# Patient Record
Sex: Female | Born: 1935 | Race: White | Hispanic: No | Smoking: Former smoker
Health system: Southern US, Community
[De-identification: ages and names within clinical notes are randomized; demographics above are authoritative.]

## PROBLEM LIST (undated history)

## (undated) DIAGNOSIS — C3490 Malignant neoplasm of unspecified part of unspecified bronchus or lung: Secondary | ICD-10-CM

## (undated) DIAGNOSIS — D126 Benign neoplasm of colon, unspecified: Secondary | ICD-10-CM

## (undated) DIAGNOSIS — D649 Anemia, unspecified: Secondary | ICD-10-CM

## (undated) DIAGNOSIS — R0600 Dyspnea, unspecified: Secondary | ICD-10-CM

## (undated) DIAGNOSIS — L719 Rosacea, unspecified: Secondary | ICD-10-CM

## (undated) DIAGNOSIS — I499 Cardiac arrhythmia, unspecified: Secondary | ICD-10-CM

## (undated) DIAGNOSIS — R51 Headache: Secondary | ICD-10-CM

## (undated) DIAGNOSIS — R519 Headache, unspecified: Secondary | ICD-10-CM

## (undated) DIAGNOSIS — Z79811 Long term (current) use of aromatase inhibitors: Secondary | ICD-10-CM

## (undated) DIAGNOSIS — C4490 Unspecified malignant neoplasm of skin, unspecified: Secondary | ICD-10-CM

## (undated) DIAGNOSIS — C50911 Malignant neoplasm of unspecified site of right female breast: Secondary | ICD-10-CM

## (undated) DIAGNOSIS — J189 Pneumonia, unspecified organism: Secondary | ICD-10-CM

## (undated) DIAGNOSIS — C50919 Malignant neoplasm of unspecified site of unspecified female breast: Secondary | ICD-10-CM

## (undated) DIAGNOSIS — R000 Tachycardia, unspecified: Secondary | ICD-10-CM

## (undated) DIAGNOSIS — Z86010 Personal history of colonic polyps: Secondary | ICD-10-CM

## (undated) DIAGNOSIS — K5790 Diverticulosis of intestine, part unspecified, without perforation or abscess without bleeding: Secondary | ICD-10-CM

## (undated) DIAGNOSIS — D329 Benign neoplasm of meninges, unspecified: Secondary | ICD-10-CM

## (undated) DIAGNOSIS — M1990 Unspecified osteoarthritis, unspecified site: Secondary | ICD-10-CM

## (undated) HISTORY — PX: BREAST EXCISIONAL BIOPSY: SUR124

## (undated) HISTORY — PX: EYE SURGERY: SHX253

## (undated) HISTORY — DX: Malignant neoplasm of unspecified site of right female breast: C50.911

## (undated) HISTORY — DX: Unspecified osteoarthritis, unspecified site: M19.90

## (undated) HISTORY — PX: COLONOSCOPY: SHX174

## (undated) HISTORY — PX: BREAST LUMPECTOMY: SHX2

## (undated) HISTORY — DX: Personal history of colonic polyps: Z86.010

## (undated) HISTORY — DX: Rosacea, unspecified: L71.9

## (undated) HISTORY — DX: Diverticulosis of intestine, part unspecified, without perforation or abscess without bleeding: K57.90

## (undated) HISTORY — DX: Benign neoplasm of colon, unspecified: D12.6

## (undated) HISTORY — PX: MOHS SURGERY: SUR867

---

## 1958-12-19 HISTORY — PX: BREAST BIOPSY: SHX20

## 1988-12-19 HISTORY — PX: WRIST SURGERY: SHX841

## 1998-10-09 ENCOUNTER — Other Ambulatory Visit: Admission: RE | Admit: 1998-10-09 | Discharge: 1998-10-09 | Payer: Self-pay | Admitting: Internal Medicine

## 1999-01-20 ENCOUNTER — Encounter (HOSPITAL_BASED_OUTPATIENT_CLINIC_OR_DEPARTMENT_OTHER): Payer: Self-pay | Admitting: Internal Medicine

## 1999-01-20 ENCOUNTER — Ambulatory Visit (HOSPITAL_COMMUNITY): Admission: RE | Admit: 1999-01-20 | Discharge: 1999-01-20 | Payer: Self-pay | Admitting: Internal Medicine

## 2000-02-03 ENCOUNTER — Ambulatory Visit (HOSPITAL_COMMUNITY): Admission: RE | Admit: 2000-02-03 | Discharge: 2000-02-03 | Payer: Self-pay | Admitting: Internal Medicine

## 2000-02-03 ENCOUNTER — Encounter (HOSPITAL_BASED_OUTPATIENT_CLINIC_OR_DEPARTMENT_OTHER): Payer: Self-pay | Admitting: Internal Medicine

## 2000-10-26 ENCOUNTER — Other Ambulatory Visit: Admission: RE | Admit: 2000-10-26 | Discharge: 2000-10-26 | Payer: Self-pay | Admitting: Internal Medicine

## 2001-02-07 ENCOUNTER — Encounter (HOSPITAL_BASED_OUTPATIENT_CLINIC_OR_DEPARTMENT_OTHER): Payer: Self-pay | Admitting: Internal Medicine

## 2001-02-07 ENCOUNTER — Ambulatory Visit (HOSPITAL_COMMUNITY): Admission: RE | Admit: 2001-02-07 | Discharge: 2001-02-07 | Payer: Self-pay | Admitting: Internal Medicine

## 2002-02-21 ENCOUNTER — Ambulatory Visit (HOSPITAL_COMMUNITY): Admission: RE | Admit: 2002-02-21 | Discharge: 2002-02-21 | Payer: Self-pay | Admitting: Internal Medicine

## 2002-02-21 ENCOUNTER — Encounter: Payer: Self-pay | Admitting: Internal Medicine

## 2002-05-19 DIAGNOSIS — Z86010 Personal history of colonic polyps: Secondary | ICD-10-CM

## 2002-05-19 DIAGNOSIS — Z860101 Personal history of adenomatous and serrated colon polyps: Secondary | ICD-10-CM

## 2002-05-19 HISTORY — DX: Personal history of colonic polyps: Z86.010

## 2002-05-19 HISTORY — DX: Personal history of adenomatous and serrated colon polyps: Z86.0101

## 2002-05-28 ENCOUNTER — Encounter: Payer: Self-pay | Admitting: Gastroenterology

## 2003-02-24 ENCOUNTER — Encounter: Payer: Self-pay | Admitting: Internal Medicine

## 2003-02-24 ENCOUNTER — Ambulatory Visit (HOSPITAL_COMMUNITY): Admission: RE | Admit: 2003-02-24 | Discharge: 2003-02-24 | Payer: Self-pay | Admitting: Internal Medicine

## 2004-01-14 ENCOUNTER — Ambulatory Visit (HOSPITAL_COMMUNITY): Admission: RE | Admit: 2004-01-14 | Discharge: 2004-01-14 | Payer: Self-pay | Admitting: Ophthalmology

## 2004-01-21 ENCOUNTER — Ambulatory Visit (HOSPITAL_COMMUNITY): Admission: RE | Admit: 2004-01-21 | Discharge: 2004-01-21 | Payer: Self-pay | Admitting: Ophthalmology

## 2004-02-26 ENCOUNTER — Ambulatory Visit (HOSPITAL_COMMUNITY): Admission: RE | Admit: 2004-02-26 | Discharge: 2004-02-26 | Payer: Self-pay | Admitting: Internal Medicine

## 2005-06-14 ENCOUNTER — Ambulatory Visit: Payer: Self-pay | Admitting: Gastroenterology

## 2005-07-04 ENCOUNTER — Encounter (INDEPENDENT_AMBULATORY_CARE_PROVIDER_SITE_OTHER): Payer: Self-pay | Admitting: *Deleted

## 2005-07-04 ENCOUNTER — Ambulatory Visit: Payer: Self-pay | Admitting: Gastroenterology

## 2006-03-07 ENCOUNTER — Ambulatory Visit: Payer: Self-pay | Admitting: Gastroenterology

## 2006-03-29 ENCOUNTER — Ambulatory Visit: Payer: Self-pay | Admitting: Gastroenterology

## 2006-05-02 ENCOUNTER — Ambulatory Visit: Payer: Self-pay | Admitting: Gastroenterology

## 2006-05-09 ENCOUNTER — Ambulatory Visit: Payer: Self-pay | Admitting: Gastroenterology

## 2006-06-07 ENCOUNTER — Ambulatory Visit: Payer: Self-pay | Admitting: Gastroenterology

## 2007-04-06 ENCOUNTER — Ambulatory Visit (HOSPITAL_COMMUNITY): Admission: RE | Admit: 2007-04-06 | Discharge: 2007-04-06 | Payer: Self-pay | Admitting: Internal Medicine

## 2007-04-16 ENCOUNTER — Encounter: Admission: RE | Admit: 2007-04-16 | Discharge: 2007-04-16 | Payer: Self-pay | Admitting: Internal Medicine

## 2007-09-19 ENCOUNTER — Encounter: Admission: RE | Admit: 2007-09-19 | Discharge: 2007-09-19 | Payer: Self-pay | Admitting: Internal Medicine

## 2008-04-07 ENCOUNTER — Encounter: Admission: RE | Admit: 2008-04-07 | Discharge: 2008-04-07 | Payer: Self-pay | Admitting: Internal Medicine

## 2009-04-08 ENCOUNTER — Encounter: Admission: RE | Admit: 2009-04-08 | Discharge: 2009-04-08 | Payer: Self-pay | Admitting: Internal Medicine

## 2010-04-15 ENCOUNTER — Encounter: Admission: RE | Admit: 2010-04-15 | Discharge: 2010-04-15 | Payer: Self-pay | Admitting: Internal Medicine

## 2010-05-28 ENCOUNTER — Encounter (INDEPENDENT_AMBULATORY_CARE_PROVIDER_SITE_OTHER): Payer: Self-pay | Admitting: *Deleted

## 2010-07-26 ENCOUNTER — Ambulatory Visit: Payer: Self-pay | Admitting: Gastroenterology

## 2010-07-26 DIAGNOSIS — Z860100 Personal history of colon polyps, unspecified: Secondary | ICD-10-CM | POA: Insufficient documentation

## 2010-09-18 DIAGNOSIS — D126 Benign neoplasm of colon, unspecified: Secondary | ICD-10-CM

## 2010-09-18 HISTORY — DX: Benign neoplasm of colon, unspecified: D12.6

## 2010-09-28 ENCOUNTER — Ambulatory Visit: Payer: Self-pay | Admitting: Gastroenterology

## 2010-09-30 ENCOUNTER — Encounter: Payer: Self-pay | Admitting: Gastroenterology

## 2011-01-09 ENCOUNTER — Encounter: Payer: Self-pay | Admitting: Internal Medicine

## 2011-01-20 NOTE — Letter (Signed)
Summary: Colonoscopy-Changed to Office Visit Letter  [facility]  [address] [place], [place] [address]   Phone: [phone]  Fax: [phone]      May 28, 2010 MRN: [id number]   [facility] [address] [place], [place]  [address]   Dear Ms. Sumners,   According to our records, it is time for you to schedule a Colonoscopy. However, after reviewing your medical record, I feel that an office visit would be most appropriate to more completely evaluate you and determine your need for a repeat procedure.  Please call [phone] (option #2) at your convenience to schedule an office visit. If you have any questions, concerns, or feel that this letter is in error, we would appreciate your call.   Sincerely,  Judie Petit T. Russella Dar, M.D.  [facility] [phone]

## 2011-01-20 NOTE — Procedures (Signed)
Summary: Colonoscopy and biopsy   Colonoscopy  Procedure date:  07/04/2005  Findings:      Results: Hemorrhoids.     Pathology:  Hyperplastic polyp.     Location:  [facility].    Procedures Next Due Date:    Colonoscopy: 07/2010  Colonoscopy  Procedure date:  07/04/2005  Findings:      Results: Hemorrhoids.     Pathology:  Hyperplastic polyp.     Location:  [facility].    Procedures Next Due Date:    Colonoscopy: 07/2010 Patient Name: [name] French, [name] French MRN:  Procedure Procedures: Colonoscopy CPT: 09811.    with Hot Biopsy(s)CPT: Z451292.  Personnel: Endoscopist: Venita Lick. Russella Dar, MD, Clementeen Graham.  Exam Location: Exam performed in Outpatient Clinic. Outpatient  Patient Consent: Procedure, Alternatives, Risks and Benefits discussed, consent obtained, from patient. Consent was obtained by the RN.  Indications  Surveillance of: Adenomatous Polyp(s). Initial polypectomy was performed in 2003. in Jun.  History  Current Medications: Patient is not currently taking Coumadin.  Pre-Exam Physical: Performed Jul 04, 2005. Cardio-pulmonary exam WNL. Rectal exam abnormal. HEENT exam , Abdominal exam, Mental status exam WNL. Abnormal PE findings include: ext. hem.  Exam Exam: Extent of exam reached: Cecum, extent intended: Cecum.  The cecum was identified by appendiceal orifice and IC valve. Colon retroflexion performed. ASA Classification: II. Tolerance: excellent.  Monitoring: Pulse and BP monitoring, Oximetry used. Supplemental O2 given.  Colon Prep Used Glycolax for colon prep. Prep results: good.  Sedation Meds: Patient assessed and found to be appropriate for moderate (conscious) sedation. Fentanyl 75 mcg. given IV. Versed 6 mg. given IV.  Findings NORMAL EXAM: Cecum to Sigmoid Colon.  MULTIPLE POLYPS: Rectum. minimum size 2 mm, maximum size 3 mm. Procedure:  hot biopsy, removed, Polyp retrieved, 4 polyps Polyps sent to pathology. ICD9:  Colon Polyps: 211.3.  HEMORRHOIDS: Internal. Size: Medium. Not bleeding. Not thrombosed. ICD9: Hemorrhoids, Internal: 455.0.   Assessment  Diagnoses: 211.3: Colon Polyps.  455.0: Hemorrhoids, Internal.   Events  Unplanned Interventions: No intervention was required.  Unplanned Events: There were no complications. Plans  Post Exam Instructions: No aspirin or non-steroidal containing medications: 2 weeks.  Medication Plan: Await pathology.  Patient Education: Patient given standard instructions for: Polyps. Hemorrhoids.  Disposition: After procedure patient sent to recovery. After recovery patient sent home.  Scheduling/Referral: Colonoscopy, to [facility] T. Russella Dar, MD, [facility], around Jul 04, 2010.    This report was created from the original endoscopy report, which was reviewed and signed by the above listed endoscopist.    cc: Guerry Bruin, MD       SP Surgical Pathology - STATUS: Final             By: Gwenlyn Saran  ,        Perform Date: 17Jul06 00:00  Ordered By: Rica Records Date: 17Jul06 21:27  Facility: [facility]                               Department: CPATH  Service Report Text  [facility]   [address] [address]   [place], [place] [address]   Telephone [phone] or [phone] Fax [phone]    REPORT OF SURGICAL PATHOLOGY    Case #: [id number]   Patient Name: [name], French   PID: [id number]   Pathologist: Marvia Pickles  Clelia Croft, MD   DOB/Age [birth year] (Age: 77) Gender: F   Date Taken: 07/04/2005   Date Received: 07/04/2005    FINAL DIAGNOSIS    ***MICROSCOPIC EXAMINATION AND DIAGNOSIS***    RECTUM, POLYP(S): FOUR HYPERPLASTIC POLYP(S). NO ADENOMATOUS   CHANGES OR EVIDENCE OF MALIGNANCY IDENTIFIED (BIOPSY).    COMMENT   There are benign colorectal type glands that have a serrated   architecture consistent with a hyperplastic polyp(s). No   adenomatous changes or evidence of malignancy is  identified.    gdt   Date Reported: 07/05/2005 Berneta Levins, MD   *** Electronically Signed Out By JAS ***    Clinical information   F/U colon polyps HX (tw)    specimen(s) obtained   Rectum, polyp(s), x4    Gross Description   Received in formalin are tan, soft tissue fragments that are   submitted in toto. Number: 4   Size: 0.1 to 0.5 cm, 1 block submitted. (lc:tw 07/04/05)     tw/

## 2011-01-20 NOTE — Assessment & Plan Note (Signed)
Summary: RECALL COLON/77YRS OLD/YF   History of Present Illness Visit Type: Follow-up Visit Primary GI MD: Elie Goody MD [facility] Primary Provider: Guerry Bruin, MD Chief Complaint: colonoscopy age 82, h/o polyps. History of Present Illness:   This is an 77 year old female who has a prior history of adenomatous colon polyps diagnosed in June 2003. Her last colonoscopy was in July 2006 showed internal hemorrhoids and 2 small colon polyps. She has a history of arthritis, rosacea and osteoporosis. He has no ongoing gastrointestinal complaints.   GI Review of Systems      Denies abdominal pain, acid reflux, belching, bloating, chest pain, dysphagia with liquids, dysphagia with solids, heartburn, loss of appetite, nausea, vomiting, vomiting blood, weight loss, and  weight gain.        Denies anal fissure, black tarry stools, change in bowel habit, constipation, diarrhea, diverticulosis, fecal incontinence, heme positive stool, hemorrhoids, irritable bowel syndrome, jaundice, light color stool, liver problems, rectal bleeding, and  rectal pain.   Current Medications (verified): 1)  None  Allergies (verified): 1)  ! Codeine  Past History:  Past Medical History: Reviewed history from 07/22/2010 and no changes required. Hemorrhoids Arthritis Allergic rhinitis Rosacea Osteoporosis Diverticulosis Adenomatous Colon Polyps 05/2002  Past Surgical History: Reviewed history from 07/22/2010 and no changes required. Benign breast biopsy Wrist surgery  Family History: No FH of Colon Cancer: Family History of Prostate Cancer:father Lung Cancer-Sister  Social History: Reviewed history from 07/22/2010 and no changes required. Divorced and Widowed retired Patient has never smoked.  Alcohol Use - yes Illicit Drug Use - no  Review of Systems       The patient complains of allergy/sinus.         The pertinent positives and negatives are noted as above and in the HPI. All other  ROS were reviewed and were negative.   Vital Signs:  Patient profile:   77 year old female Height:      65 inches Weight:      144 pounds BMI:     24.05 Pulse rate:   80 / minute Pulse rhythm:   regular BP sitting:   106 / 58  (left arm)  Vitals Entered By: Milford Cage NCMA (July 26, 2010 11:00 AM)  Physical Exam  General:  Well developed, well nourished, no acute distress. Head:  Normocephalic and atraumatic. Eyes:  PERRLA, no icterus. Ears:  Normal auditory acuity. Mouth:  No deformity or lesions, dentition normal. Lungs:  Clear throughout to auscultation. Heart:  Regular rate and rhythm; no murmurs, rubs,  or bruits. Abdomen:  Soft, nontender and nondistended. No masses, hepatosplenomegaly or hernias noted. Normal bowel sounds. Rectal:  deferred until time of colonoscopy.   Pulses:  Normal pulses noted. Extremities:  No clubbing, cyanosis, edema or deformities noted. Neurologic:  Alert and  oriented x4;  grossly normal neurologically. Cervical Nodes:  No significant cervical adenopathy. Inguinal Nodes:  No significant inguinal adenopathy. Psych:  Alert and cooperative. Normal mood and affect.  Impression & Recommendations:  Problem # 1:  PERSONAL HISTORY OF COLONIC POLYPS (ICD-V12.72) Prior history of adenomatous colon polyps. She is in excellent health and would like to proceed with routine surveillance colonoscopy.The risks, benefits and alternatives to colonoscopy with possible biopsy and possible polypectomy were discussed with the patient and they consent to proceed. The procedure will be scheduled electively. Orders: Colonoscopy (Colon)  Patient Instructions: 1)  Colonoscopy brochure given.  2)  Pick up your prep from your pharmacy.  3)  Copy  sent to : Guerry Bruin, MD 4)  The medication list was reviewed and reconciled.  All changed / newly prescribed medications were explained.  A complete medication list was provided to the patient /  caregiver.  Prescriptions: MOVIPREP 100 GM  SOLR (PEG-KCL-NACL-NASULF-NA ASC-C) As per prep instructions.  #1 x 0   Entered by:   Christie Nottingham CMA (AAMA)   Authorized by:   Meryl Dare MD [facility]   Signed by:   Meryl Dare MD FACG on 07/26/2010   Method used:   Electronically to        [facility] (retail)       [address]       [place], [place]  [address]       Ph: [phone] or [phone]       Fax: [phone]   RxID:   [id number]

## 2011-01-20 NOTE — Letter (Signed)
Summary: Patient Notice- Polyp Results  [facility]  [address] [place], [place] [address]   Phone: [phone]  Fax: [phone]        September 30, 2010 MRN: [id number]    [facility] [address] [place], [place]  [address]    Dear Ms. Olson,  I am pleased to inform you that the colon polyp(s) removed during your recent colonoscopy was (were) found to be benign (no cancer detected) upon pathologic examination.  I recommend you have a repeat colonoscopy examination in 1 year to look for recurrent polyps, as having colon polyps increases your risk for having recurrent polyps or even colon cancer in the future.  Should you develop new or worsening symptoms of abdominal pain, bowel habit changes or bleeding from the rectum or bowels, please schedule an evaluation with either your primary care physician or with me.  Continue treatment plan as outlined the day of your exam.  Please call us if you are having persistent problems or have questions about your condition that have not been fully answered at this time.  Sincerely,  Meryl Dare MD [facility]  This letter has been electronically signed by your physician.  Appended Document: Patient Notice- Polyp Results letter mailed

## 2011-01-20 NOTE — Procedures (Signed)
Summary: Colonoscopy   Colonoscopy  Procedure date:  05/28/2002  Findings:      Results: Hemorrhoids.     Results: Diverticulosis.       Pathology:  Adenomatous polyp.        Location:  [facility].    Procedures Next Due Date:    Colonoscopy: 05/2005  Colonoscopy  Procedure date:  05/28/2002  Findings:      Results: Hemorrhoids.     Results: Diverticulosis.       Pathology:  Adenomatous polyp.        Location:  [facility].    Procedures Next Due Date:    Colonoscopy: 05/2005 Patient Name: French French MRN:  Procedure Procedures: Colonoscopy CPT: [phone].    with Hot Biopsy(s)CPT: Z451292.    with polypectomy. CPT: A3573898.  Personnel: Endoscopist: Venita Lick. Russella Dar, MD, Clementeen Graham.  Referred By: Guerry Bruin, MD.  Exam Location: Exam performed in Outpatient Clinic. Outpatient  Patient Consent: Procedure, Alternatives, Risks and Benefits discussed, consent obtained, from patient. Consent was obtained by the RN.  Indications  Average Risk Screening Routine.  History  Pre-Exam Physical: Performed May 28, 2002. Cardio-pulmonary exam, Rectal exam, Abdominal exam, Neurological exam, Mental status exam WNL.  Exam Exam: Extent of exam reached: Cecum, extent intended: Cecum.  The cecum was identified by appendiceal orifice and IC valve. Colon retroflexion performed. ASA Classification: II. Tolerance: good.  Monitoring: Pulse and BP monitoring, Oximetry used. Supplemental O2 given.  Colon Prep Used Golytely for colon prep. Prep results: good.  Sedation Meds: Patient assessed and found to be appropriate for moderate (conscious) sedation. Fentanyl 50 mcg. given IV. Versed 5 mg. given IV.  Findings NORMAL EXAM: Splenic Flexure.  POLYP: Transverse Colon, Maximum size: 4 mm. sessile polyp. Procedure:  hot biopsy, removed, retrieved, Polyp sent to pathology. ICD9: Colon Polyps: 211.3.  NORMAL EXAM: Ascending Colon to Hepatic Flexure.    POLYP: Descending Colon, Maximum size: 5 mm. sessile polyp. Procedure:  snare with cautery, removed, retrieved, sent to pathology. ICD9: Colon Polyps: 211.3.  - DIVERTICULOSIS: Sigmoid Colon. Not bleeding. ICD9: Diverticulosis: 562.10. Comments: Mild.  POLYP: Cecum, Maximum size: 6 mm. sessile polyp. Procedure:  snare with cautery, removed, retrieved, sent to pathology. ICD9: Colon Polyps: 211.3.  HEMORRHOIDS: Internal. Size: Small. Not bleeding. Not thrombosed. ICD9: Hemorrhoids, Internal: 455.0.   Assessment  Diagnoses: 562.10: Diverticulosis.  211.3: Colon Polyps.  455.0: Hemorrhoids, Internal.   Events  Unplanned Interventions: No intervention was required.  Unplanned Events: There were no complications. Plans  Post Exam Instructions: No aspirin or non-steroidal containing medications: 2 weeks.  Medication Plan: Await pathology. Continue current medications.  Patient Education: Patient given standard instructions for: Polyps. Diverticulosis. Hemorrhoids.  Disposition: After procedure patient sent to recovery. After recovery patient sent home.  Scheduling/Referral: Colonoscopy, to [facility] T. Russella Dar, MD, [facility], if polyp(s) adenomatous, around May 28, 2005.  Primary Care Provider, to Guerry Bruin, MD,    This report was created from the original endoscopy report, which was reviewed and signed by the above listed endoscopist.    cc: Guerry Bruin, MD

## 2011-01-20 NOTE — Assessment & Plan Note (Signed)
Summary: Gastroenterology  Saachi S MR#:  [id number] Page #  NAME:  [name] French, [name] French  OFFICE NO:  [id number]  DATE:  06/07/06  DOB:  2026-07-14  The patient has had complete resolution of her diarrhea and has no gastrointestinal complaints.  She completed the course of Xifaxan, and her diarrhea improved but did not abate.  She then decided to take several days of Activia live-culture yogurt, and following this, her bowel habits completely normalized.  She discontinued yogurt, and her bowels have remained normal.    CURRENT MEDICATIONS:  Listed on the chart, updated, and reviewed.    MEDICATION ALLERGIES:  Codeine.    PHYSICAL EXAMINATION:  No acute distress.  Weight 134.6 pounds, up 2.4 pounds from her last visit, blood pressure 118/62, pulse 78 and regular.  She is not reexamined.    ASSESSMENT AND PLAN:   1.  Resolved diarrhea.  Presumably she had an infectious diarrhea or bacterial overgrowth.  Antibiotics partially helped her symptoms, but her symptoms only fully resolved after antibiotics plus live-culture yogurt.   2.  History of adenomatous colon polyps.  Recall colonoscopy recommended for June 2011.        Venita Lick. Russella Dar, M.D., F.A.C.G.  [id number] cc:  Guerry Bruin, M.D.  D:  06/07/06; T:  ; Job [phone]

## 2011-01-20 NOTE — Procedures (Signed)
Summary: Colonoscopy  Patient: [name] French Note: All result statuses are Final unless otherwise noted.  Tests: (1) Colonoscopy (COL)   COL Colonoscopy           DONE     [facility]     [address]     [place], [place]  [address]           COLONOSCOPY PROCEDURE REPORT     PATIENT:  [name] French, [name] French  MR#:  [id number]     BIRTHDATE:  [birth year], 83 yrs. old  GENDER:  female     ENDOSCOPIST:  Judie Petit T. Russella Dar, MD, [facility]           PROCEDURE DATE:  09/28/2010     PROCEDURE:  Colonoscopy with snare polypectomy     ASA CLASS:  Class II     INDICATIONS:  1) surveillance and high-risk screening  2)     follow-up of polyp : adenomatous polyp, 05/2002.     MEDICATIONS:   Fentanyl 50 mcg IV, Versed 8 mg IV     DESCRIPTION OF PROCEDURE:   After the risks benefits and     alternatives of the procedure were thoroughly explained, informed     consent was obtained.  Digital rectal exam was performed and     revealed moderate external hemorrhoids.   The LB [id number]     [id number] endoscope was introduced through the anus and advanced to     the cecum, which was identified by both the appendix and ileocecal     valve, without limitations.  The quality of the prep was     excellent, using MoviPrep.  The instrument was then slowly     withdrawn as the colon was fully examined.     <<PROCEDUREIMAGES>>     FINDINGS:  A sessile polyp was found in the ascending colon. It     was 12 mm in size. Polyp was snared, then cauterized with     monopolar cautery. Retrieval was successful. Piecemeal     polypectomy.  A sessile polyp was found in the descending colon.     It was 4 mm in size. Polyp was snared without cautery. Retrieval     was successful. A pedunculated polyp was found in the sigmoid     colon. It was 8 mm in size. Polyp was snared, then cauterized with     monopolar cautery. Retrieval was successful. Moderate     diverticulosis was found in the sigmoid to descending colon.  This  was otherwise a normal examination of the colon.   Retroflexed     views in the rectum revealed no abnormalities.  The time to cecum     =  3.5  minutes. The scope was then withdrawn (time =  16.5  min)     from the patient and the procedure completed.           COMPLICATIONS:  None           ENDOSCOPIC IMPRESSION:     1) 12 mm sessile polyp in the ascending colon     2) 4 mm sessile polyp in the descending colon     3) 8 mm pedunculated polyp in the sigmoid colon     4) Moderate diverticulosis in the sigmoid to descending colon           RECOMMENDATIONS:     1) No aspirin or NSAID's for 2 weeks     2) Await pathology results  3) High fiber diet with liberal fluid intake.     4) Repeat Colonoscopy in 1 year if ascending colon polyp is     adenomatous           Malcolm T. Russella Dar, MD, Clementeen Graham           CC: Guerry Bruin, MD           n.     Rosalie DoctorVenita Lick. Stark at 09/28/2010 09:37 AM           Bjorn Pippin, [id number]  Note: An exclamation mark (!) indicates a result that was not dispersed into the flowsheet. Document Creation Date: 09/28/2010 9:38 AM _______________________________________________________________________  (1) Order result status: Final Collection or observation date-time: 09/28/2010 09:31 Requested date-time:  Receipt date-time:  Reported date-time:  Referring Physician:   Ordering Physician: Claudette Head [phone]) Specimen Source:  Source: Launa Grill Order Number: [id number] Lab site:   Appended Document: Colonoscopy     Procedures Next Due Date:    Colonoscopy: 09/2011

## 2011-01-20 NOTE — Letter (Signed)
Summary: [facility] Instructions  [facility]  [address] [place], [place] [address]   Phone: [phone]  Fax: [phone]       [name] French    1926/04/21    MRN: [id number]        Procedure Day /Date:Tuesday October 11th, 2011     Arrival Time: 8:00am     Procedure Time: 9:00am     Location of Procedure:                    _x _  [facility] (4th Floor)                        PREPARATION FOR COLONOSCOPY WITH MOVIPREP   Starting 5 days prior to your procedure 10/6/11do not eat nuts, seeds, popcorn, corn, beans, peas,  salads, or any raw vegetables.  Do not take any fiber supplements (e.g. Metamucil, Citrucel, and Benefiber).  THE DAY BEFORE YOUR PROCEDURE         DATE: 09/27/10  DAY: Monday  1.  Drink clear liquids the entire day-NO SOLID FOOD  2.  Do not drink anything colored red or purple.  Avoid juices with pulp.  No orange juice.  3.  Drink at least 64 oz. (8 glasses) of fluid/clear liquids during the day to prevent dehydration and help the prep work efficiently.  CLEAR LIQUIDS INCLUDE: Water Jello Ice Popsicles Tea (sugar ok, no milk/cream) Powdered fruit flavored drinks Coffee (sugar ok, no milk/cream) Gatorade Juice: apple, white grape, white cranberry  Lemonade Clear bullion, consomm, broth Carbonated beverages (any kind) Strained chicken noodle soup Hard Candy                             4.  In the morning, mix first dose of MoviPrep solution:    Empty 1 Pouch A and 1 Pouch B into the disposable container    Add lukewarm drinking water to the top line of the container. Mix to dissolve    Refrigerate (mixed solution should be used within 24 hrs)  5.  Begin drinking the prep at 5:00 p.m. The MoviPrep container is divided by 4 marks.   Every 15 minutes drink the solution down to the next mark (approximately 8 oz) until the full liter is complete.   6.  Follow completed prep with 16 oz of clear liquid of your choice  (Nothing red or purple).  Continue to drink clear liquids until bedtime.  7.  Before going to bed, mix second dose of MoviPrep solution:    Empty 1 Pouch A and 1 Pouch B into the disposable container    Add lukewarm drinking water to the top line of the container. Mix to dissolve    Refrigerate  THE DAY OF YOUR PROCEDURE      DATE: 09/28/10 DAY: Tuesday  Beginning at 4:00 a.m. (5 hours before procedure):         1. Every 15 minutes, drink the solution down to the next mark (approx 8 oz) until the full liter is complete.  2. Follow completed prep with 16 oz. of clear liquid of your choice.    3. You may drink clear liquids until 7:00am  (2 HOURS BEFORE PROCEDURE).   MEDICATION INSTRUCTIONS  Unless otherwise instructed, you should take regular prescription medications with a small sip of water   as early as possible the morning of your procedure.  OTHER INSTRUCTIONS  You will need a responsible adult at least 75 years of age to accompany you and drive you home.   This person must remain in the waiting room during your procedure.  Wear loose fitting clothing that is easily removed.  Leave jewelry and other valuables at home.  However, you may wish to bring a book to read or  an iPod/MP3 player to listen to music as you wait for your procedure to start.  Remove all body piercing jewelry and leave at home.  Total time from sign-in until discharge is approximately 2-3 hours.  You should go home directly after your procedure and rest.  You can resume normal activities the  day after your procedure.  The day of your procedure you should not:   Drive   Make legal decisions   Operate machinery   Drink alcohol   Return to work  You will receive specific instructions about eating, activities and medications before you leave.    The above instructions have been reviewed and explained to me by   Estill Bamberg.     I fully understand and can verbalize these  instructions _____________________________ Date _________

## 2011-04-13 ENCOUNTER — Other Ambulatory Visit: Payer: Self-pay | Admitting: Internal Medicine

## 2011-04-13 DIAGNOSIS — Z1231 Encounter for screening mammogram for malignant neoplasm of breast: Secondary | ICD-10-CM

## 2011-04-25 ENCOUNTER — Ambulatory Visit (INDEPENDENT_AMBULATORY_CARE_PROVIDER_SITE_OTHER): Payer: Medicare Other | Admitting: Gastroenterology

## 2011-04-25 ENCOUNTER — Encounter: Payer: Self-pay | Admitting: Gastroenterology

## 2011-04-25 ENCOUNTER — Telehealth: Payer: Self-pay | Admitting: Gastroenterology

## 2011-04-25 VITALS — BP 126/64 | HR 60 | Ht 65.0 in | Wt 144.0 lb

## 2011-04-25 DIAGNOSIS — Z860100 Personal history of colon polyps, unspecified: Secondary | ICD-10-CM

## 2011-04-25 DIAGNOSIS — R197 Diarrhea, unspecified: Secondary | ICD-10-CM

## 2011-04-25 MED ORDER — RIFAXIMIN 550 MG PO TABS: 550.0000 mg | ORAL_TABLET | Freq: Three times a day (TID) | ORAL | Status: AC

## 2011-04-25 MED ORDER — DIPHENOXYLATE-ATROPINE 2.5-0.025 MG PO TABS: 1.0000 | ORAL_TABLET | Freq: Two times a day (BID) | ORAL | Status: DC | PRN

## 2011-04-25 NOTE — Patient Instructions (Addendum)
Avoid high fat foods, caffeine, milk products, and alcohol. Your prescriptions have been sent to your pharmacy. cc. Guerry Bruin, MD

## 2011-04-25 NOTE — Progress Notes (Addendum)
History of Present Illness: This is an 77 year old female complaining of 10 days of diarrhea. She states she had several episodes of diarrhea during one day in early April and then her diarrhea abated. She traveled in [place] for 2 weeks in April and approximately 3 days after returning from [place] she had the onset of daily diarrhea. She describes urgent, watery, liquid brown stools without bleeding associated with lower abdominal cramping. Her appetite is good and her weight is stable. She denies fevers chills recent medication changes and recent antibiotic usage. She history in 2007 of a diarrheal illness which was likely an infection or bacterial overgrowth. She responded to a ten-day course of Xifaxan.   Current Medications, Allergies, Past Medical History, Past Surgical History, Family History and Social History were reviewed in [employer] record.  Physical Exam: General: Well developed , well nourished, no acute distress Head: Normocephalic and atraumatic Eyes:  sclerae anicteric, EOMI Ears: Normal auditory acuity Mouth: No deformity or lesions Lungs: Clear throughout to auscultation Heart: Regular rate and rhythm; no murmurs, rubs or bruits Abdomen: Soft, non tender and non distended. No masses, hepatosplenomegaly or hernias noted. Normal Bowel sounds Musculoskeletal: Symmetrical with no gross deformities  Pulses:  Normal pulses noted Extremities: No clubbing, cyanosis, edema or deformities noted Neurological: Alert oriented x 4, grossly nonfocal Psychological:  Alert and cooperative. Normal mood and affect  Assessment and Recommendations:  1. Diarrhea. I suspect she has recurrent bacterial overgrowth.  Rule out an infectious diarrhea. Ten-day course of Xifaxan and if her symptoms do not completely resolve we will proceed with further evaluation.  2. History of tubulovillous adenomatous colon polyp. Piecemeal polypectomy of ascending colon polyp in October 2011.   Recommend colonoscopy in October 2012.

## 2011-04-25 NOTE — Telephone Encounter (Signed)
Patient will come in today and see Dr Russella Dar at 2:15.  She has had 10 days of diarrhea.  She has been eating a bland diet.  Diarrhea getting worse.

## 2011-05-02 ENCOUNTER — Telehealth: Payer: Self-pay | Admitting: Gastroenterology

## 2011-05-02 ENCOUNTER — Ambulatory Visit
Admission: RE | Admit: 2011-05-02 | Discharge: 2011-05-02 | Disposition: A | Discharge status: Home / Self Care | Payer: PRIVATE HEALTH INSURANCE | Source: Ambulatory Visit | Attending: Internal Medicine | Admitting: Internal Medicine

## 2011-05-02 DIAGNOSIS — Z1231 Encounter for screening mammogram for malignant neoplasm of breast: Secondary | ICD-10-CM

## 2011-05-02 DIAGNOSIS — R197 Diarrhea, unspecified: Secondary | ICD-10-CM

## 2011-05-02 NOTE — Telephone Encounter (Signed)
Patient took the xifaxan for 7 days she feels it made her symptoms worse. She said intensified her cramps and made diarrhea worse.  She reports that she stopped it Saturday and started on Align.  She reports she is using Lomotil 1 time a day and has some form to her BMs now.  Per your last office note you were going to consider additional testing if she still has diarrhea.

## 2011-05-02 NOTE — Telephone Encounter (Signed)
Left message for patient to call back  

## 2011-05-02 NOTE — Telephone Encounter (Signed)
Patient advised she will come for labs

## 2011-05-02 NOTE — Telephone Encounter (Signed)
Stool for C&S, O&P, WBCs, C. Diff, qualitative fecal fat and stool hemoccult.

## 2011-05-09 ENCOUNTER — Other Ambulatory Visit: Payer: PRIVATE HEALTH INSURANCE

## 2011-05-09 DIAGNOSIS — R197 Diarrhea, unspecified: Secondary | ICD-10-CM

## 2011-05-11 ENCOUNTER — Telehealth: Payer: Self-pay | Admitting: *Deleted

## 2011-05-11 LAB — OVA AND PARASITE SCREEN: OP: NONE SEEN

## 2011-05-11 NOTE — Telephone Encounter (Signed)
Message copied by Harlow Mares on Wed May 11, 2011  8:29 AM ------      Message from: Claudette Head      Created: Tue May 10, 2011  8:33 AM       negative

## 2011-05-11 NOTE — Telephone Encounter (Signed)
Left message all stool studies are neg. She can call back if she has questions

## 2011-05-13 LAB — STOOL CULTURE

## 2011-05-19 ENCOUNTER — Other Ambulatory Visit: Payer: Self-pay | Admitting: Gastroenterology

## 2011-05-19 ENCOUNTER — Other Ambulatory Visit: Payer: Medicare Other

## 2011-05-19 DIAGNOSIS — Z1289 Encounter for screening for malignant neoplasm of other sites: Secondary | ICD-10-CM

## 2011-05-19 LAB — HEMOCCULT SLIDES (X 3 CARDS)
OCCULT 1: NEGATIVE
OCCULT 2: NEGATIVE
OCCULT 4: NEGATIVE
OCCULT 5: NEGATIVE

## 2011-07-04 ENCOUNTER — Telehealth: Payer: Self-pay | Admitting: Gastroenterology

## 2011-07-04 MED ORDER — METRONIDAZOLE 500 MG PO TABS: 500.0000 mg | ORAL_TABLET | Freq: Two times a day (BID) | ORAL | Status: AC

## 2011-07-04 NOTE — Telephone Encounter (Signed)
Patient has had problems with xifaxan in the past.  Discussed with Dr Russella Dar patient to try flagyl 500 mg 1 po bid for 10 days. Left message for patient to call back

## 2011-07-04 NOTE — Telephone Encounter (Signed)
Patient reports she is having 3-4 loose to very soft BM a day.  She is taking 2 lomotil a day this is not controlling her symptoms.  Dr Russella Dar please advise, recent stool studies were all negative

## 2011-07-04 NOTE — Telephone Encounter (Signed)
Xifaxan for 10 days

## 2011-07-04 NOTE — Telephone Encounter (Signed)
Patient advised of Dr. Stark's recommendations. 

## 2011-07-14 ENCOUNTER — Encounter: Payer: Self-pay | Admitting: Gastroenterology

## 2011-07-14 ENCOUNTER — Other Ambulatory Visit: Payer: Self-pay | Admitting: Gastroenterology

## 2011-07-14 MED ORDER — METRONIDAZOLE 500 MG PO TABS: 500.0000 mg | ORAL_TABLET | Freq: Two times a day (BID) | ORAL | Status: AC

## 2011-07-14 NOTE — Telephone Encounter (Signed)
Left a message telling patient that I will leave the prescription out front for her to pick up.

## 2011-07-14 NOTE — Telephone Encounter (Signed)
Error

## 2011-07-14 NOTE — Telephone Encounter (Signed)
OK to refill metonidazole

## 2011-07-14 NOTE — Telephone Encounter (Signed)
Left a message for patient to return my call. 

## 2011-07-14 NOTE — Telephone Encounter (Signed)
Pt states she has finished the round of Flagyl and is doing much better and her diarrhea has subsided. She is going out of the country in a few days and wanted to know if she could get another prescription just in case she has diarrhea while on her trip. She states she will not get the prescription filled unless she needs to and will call us when she gets back if by chance she has to use it. Please advise.

## 2011-11-07 ENCOUNTER — Encounter: Payer: Self-pay | Admitting: Gastroenterology

## 2011-11-07 ENCOUNTER — Ambulatory Visit (INDEPENDENT_AMBULATORY_CARE_PROVIDER_SITE_OTHER): Payer: Medicare Other | Admitting: Gastroenterology

## 2011-11-07 VITALS — BP 120/68 | HR 62 | Ht 65.0 in | Wt 132.0 lb

## 2011-11-07 DIAGNOSIS — Z86010 Personal history of colonic polyps: Secondary | ICD-10-CM

## 2011-11-07 DIAGNOSIS — R197 Diarrhea, unspecified: Secondary | ICD-10-CM

## 2011-11-07 MED ORDER — PEG-KCL-NACL-NASULF-NA ASC-C 100 G PO SOLR: 1.0000 | Freq: Once | ORAL | Status: DC

## 2011-11-07 NOTE — Patient Instructions (Signed)
You have been scheduled for a Colonoscopy with propofol . See separate instructions.  Pick up your prep kit from your pharmacy.  cc: Guerry Bruin, MD

## 2011-11-07 NOTE — Progress Notes (Signed)
History of Present Illness: This is an 77 year old female with intermittent diarrhea for the past several months. She had ongoing diarrhea in April and May that did not improve after a course of Xifaxan but improved after a course of metronidazole. Stool studies in May were all negative. Now she has intermittent diarrhea occurring 2 or 3 times each week. Her symptoms are improved with the regular use of Align and prn use of Imodium.  Current Medications, Allergies, Past Medical History, Past Surgical History, Family History and Social History were reviewed in [employer] record.  Physical Exam: General: Well developed , well nourished, no acute distress Head: Normocephalic and atraumatic Eyes:  sclerae anicteric, EOMI Ears: Normal auditory acuity Mouth: No deformity or lesions Lungs: Clear throughout to auscultation Heart: Regular rate and rhythm; no murmurs, rubs or bruits Abdomen: Soft, non tender and non distended. No masses, hepatosplenomegaly or hernias noted. Normal Bowel sounds Rectal: Deferred to colonoscopy Musculoskeletal: Symmetrical with no gross deformities  Pulses:  Normal pulses noted Extremities: No clubbing, cyanosis, edema or deformities noted Neurological: Alert oriented x 4, grossly nonfocal Psychological:  Alert and cooperative. Normal mood and affect  Assessment and Recommendations:  1. Intermittent diarrhea. Prior workup unrevealing. Continue daily Align and Imodium when necessary. Obtain random biopsies at colonoscopy to rule out microscopic colitis. The risks, benefits, and alternatives to colonoscopy with possible biopsy and possible polypectomy were discussed with the patient and they consent to proceed.   2. Personal history of tubulovillous adenoma with piecemeal polypectomy in October 2011. Colonoscopy as above.

## 2011-12-08 ENCOUNTER — Encounter: Payer: Self-pay | Admitting: Gastroenterology

## 2011-12-08 ENCOUNTER — Ambulatory Visit (AMBULATORY_SURGERY_CENTER): Payer: Medicare Other | Admitting: Gastroenterology

## 2011-12-08 VITALS — BP 125/76 | HR 77 | Temp 98.0°F | Resp 16 | Ht 65.0 in | Wt 132.0 lb

## 2011-12-08 DIAGNOSIS — R197 Diarrhea, unspecified: Secondary | ICD-10-CM

## 2011-12-08 DIAGNOSIS — Z86010 Personal history of colonic polyps: Secondary | ICD-10-CM

## 2011-12-08 DIAGNOSIS — Z1211 Encounter for screening for malignant neoplasm of colon: Secondary | ICD-10-CM

## 2011-12-08 DIAGNOSIS — K5289 Other specified noninfective gastroenteritis and colitis: Secondary | ICD-10-CM

## 2011-12-08 NOTE — Progress Notes (Signed)
Patient did not experience any of the following events: a burn prior to discharge; a fall within the facility; wrong site/side/patient/procedure/implant event; or a hospital transfer or hospital admission upon discharge from the facility. (G8907) Patient did not have preoperative order for IV antibiotic SSI prophylaxis. (G8918)  

## 2011-12-08 NOTE — Op Note (Signed)
[facility] [address] [place], [place]  [address]  COLONOSCOPY PROCEDURE REPORT  PATIENT:  [name] French, [name] French  MR#:  [id number] BIRTHDATE:  11-Oct-1926, 85 yrs. old  GENDER:  female ENDOSCOPIST:  Judie Petit T. Russella Dar, MD, [facility]  PROCEDURE DATE:  12/08/2011 PROCEDURE:  Colonoscopy with biopsy ASA CLASS:  Class II INDICATIONS:  1) unexplained diarrhea  2) history of pre-cancerous (adenomatous) colon polyps: TVA 09/2010. MEDICATIONS:   MAC sedation, administered by CRNA, propofol (Diprivan) 130 mg IV DESCRIPTION OF PROCEDURE:   After the risks benefits and alternatives of the procedure were thoroughly explained, informed consent was obtained.  Digital rectal exam was performed and revealed no abnormalities.   The LB 180AL [id number] endoscope was introduced through the anus and advanced to the cecum, which was identified by both the appendix and ileocecal valve, without limitations.  The quality of the prep was good, using MoviPrep. The instrument was then slowly withdrawn as the colon was fully examined. <<PROCEDUREIMAGES>> FINDINGS:  Mild diverticulosis was found in the sigmoid colon. Otherwise normal colonoscopy without other polyps, masses, vascular ectasias, or inflammatory changes. Random biopsies were obtained and sent to pathology.  Retroflexed views in the rectum revealed internal hemorrhoids, moderate.  The time to cecum =  4.25 minutes. The scope was then withdrawn (time =  9.33  min) from the patient and the procedure completed.  COMPLICATIONS:  None  ENDOSCOPIC IMPRESSION: 1) Mild diverticulosis in the sigmoid colon 2) Internal hemorrhoids  RECOMMENDATIONS: 1) Await pathology results 2) Repeat Colonoscopy in 3 years if health status appropriate for surveillance.  Venita Lick. Russella Dar, MD, [facility] Graham  CC:  Guerry Bruin, MD  n. Rosalie DoctorVenita Lick. Raziya Aveni at 12/08/2011 09:30 AM  Bjorn Pippin, [phone]

## 2011-12-08 NOTE — Patient Instructions (Signed)
Discharge instructions given with verbal understanding. Handouts on diverticulosis and hemorrhoids given. Resume previous medications. 

## 2011-12-09 ENCOUNTER — Telehealth: Payer: Self-pay | Admitting: *Deleted

## 2011-12-09 NOTE — Telephone Encounter (Signed)

## 2011-12-13 ENCOUNTER — Encounter: Payer: Self-pay | Admitting: Gastroenterology

## 2012-02-02 DIAGNOSIS — H10409 Unspecified chronic conjunctivitis, unspecified eye: Secondary | ICD-10-CM | POA: Diagnosis not present

## 2012-02-03 DIAGNOSIS — M949 Disorder of cartilage, unspecified: Secondary | ICD-10-CM | POA: Diagnosis not present

## 2012-02-03 DIAGNOSIS — M899 Disorder of bone, unspecified: Secondary | ICD-10-CM | POA: Diagnosis not present

## 2012-02-03 DIAGNOSIS — Z79899 Other long term (current) drug therapy: Secondary | ICD-10-CM | POA: Diagnosis not present

## 2012-02-03 DIAGNOSIS — E785 Hyperlipidemia, unspecified: Secondary | ICD-10-CM | POA: Diagnosis not present

## 2012-02-10 DIAGNOSIS — G4700 Insomnia, unspecified: Secondary | ICD-10-CM | POA: Diagnosis not present

## 2012-02-10 DIAGNOSIS — Z1212 Encounter for screening for malignant neoplasm of rectum: Secondary | ICD-10-CM | POA: Diagnosis not present

## 2012-02-10 DIAGNOSIS — E785 Hyperlipidemia, unspecified: Secondary | ICD-10-CM | POA: Diagnosis not present

## 2012-02-10 DIAGNOSIS — M899 Disorder of bone, unspecified: Secondary | ICD-10-CM | POA: Diagnosis not present

## 2012-02-10 DIAGNOSIS — Z79899 Other long term (current) drug therapy: Secondary | ICD-10-CM | POA: Diagnosis not present

## 2012-02-10 DIAGNOSIS — Z0000 Encounter for general adult medical examination without abnormal findings: Secondary | ICD-10-CM | POA: Diagnosis not present

## 2012-03-13 DIAGNOSIS — M899 Disorder of bone, unspecified: Secondary | ICD-10-CM | POA: Diagnosis not present

## 2012-03-13 DIAGNOSIS — E785 Hyperlipidemia, unspecified: Secondary | ICD-10-CM | POA: Diagnosis not present

## 2012-08-06 DIAGNOSIS — H04129 Dry eye syndrome of unspecified lacrimal gland: Secondary | ICD-10-CM | POA: Diagnosis not present

## 2012-08-06 DIAGNOSIS — H10409 Unspecified chronic conjunctivitis, unspecified eye: Secondary | ICD-10-CM | POA: Diagnosis not present

## 2012-08-06 DIAGNOSIS — H259 Unspecified age-related cataract: Secondary | ICD-10-CM | POA: Diagnosis not present

## 2012-08-06 DIAGNOSIS — H01009 Unspecified blepharitis unspecified eye, unspecified eyelid: Secondary | ICD-10-CM | POA: Diagnosis not present

## 2012-09-20 ENCOUNTER — Other Ambulatory Visit: Payer: Self-pay | Admitting: Internal Medicine

## 2012-09-20 DIAGNOSIS — Z1231 Encounter for screening mammogram for malignant neoplasm of breast: Secondary | ICD-10-CM

## 2012-10-08 DIAGNOSIS — Z23 Encounter for immunization: Secondary | ICD-10-CM | POA: Diagnosis not present

## 2012-10-15 ENCOUNTER — Ambulatory Visit
Admission: RE | Admit: 2012-10-15 | Discharge: 2012-10-15 | Disposition: A | Discharge status: Home / Self Care | Payer: Medicare Other | Source: Ambulatory Visit | Attending: Internal Medicine | Admitting: Internal Medicine

## 2012-10-15 DIAGNOSIS — Z1231 Encounter for screening mammogram for malignant neoplasm of breast: Secondary | ICD-10-CM | POA: Diagnosis not present

## 2013-02-05 DIAGNOSIS — E785 Hyperlipidemia, unspecified: Secondary | ICD-10-CM | POA: Diagnosis not present

## 2013-02-05 DIAGNOSIS — M899 Disorder of bone, unspecified: Secondary | ICD-10-CM | POA: Diagnosis not present

## 2013-02-05 DIAGNOSIS — R82998 Other abnormal findings in urine: Secondary | ICD-10-CM | POA: Diagnosis not present

## 2013-02-05 DIAGNOSIS — Z79899 Other long term (current) drug therapy: Secondary | ICD-10-CM | POA: Diagnosis not present

## 2013-02-19 DIAGNOSIS — E785 Hyperlipidemia, unspecified: Secondary | ICD-10-CM | POA: Diagnosis not present

## 2013-02-19 DIAGNOSIS — Z0000 Encounter for general adult medical examination without abnormal findings: Secondary | ICD-10-CM | POA: Diagnosis not present

## 2013-02-19 DIAGNOSIS — K5289 Other specified noninfective gastroenteritis and colitis: Secondary | ICD-10-CM | POA: Diagnosis not present

## 2013-02-19 DIAGNOSIS — H269 Unspecified cataract: Secondary | ICD-10-CM | POA: Diagnosis not present

## 2013-04-16 ENCOUNTER — Ambulatory Visit (INDEPENDENT_AMBULATORY_CARE_PROVIDER_SITE_OTHER): Payer: Medicare Other | Admitting: Surgery

## 2013-04-16 ENCOUNTER — Encounter (INDEPENDENT_AMBULATORY_CARE_PROVIDER_SITE_OTHER): Payer: Self-pay | Admitting: Surgery

## 2013-04-16 VITALS — BP 104/70 | HR 72 | Resp 16 | Ht 65.0 in | Wt 145.0 lb

## 2013-04-16 DIAGNOSIS — C50911 Malignant neoplasm of unspecified site of right female breast: Secondary | ICD-10-CM

## 2013-04-16 DIAGNOSIS — N63 Unspecified lump in unspecified breast: Secondary | ICD-10-CM | POA: Diagnosis not present

## 2013-04-16 DIAGNOSIS — N6310 Unspecified lump in the right breast, unspecified quadrant: Secondary | ICD-10-CM

## 2013-04-16 HISTORY — DX: Malignant neoplasm of unspecified site of right female breast: C50.911

## 2013-04-16 NOTE — Patient Instructions (Signed)
We will arrange another mammogram and an ultrasound of the lump in your right breast. It will probably need to be biopsied with a needle biopsy technique

## 2013-04-16 NOTE — Progress Notes (Signed)
NAME: [name] French DOB: Jan 24, 1926 MRN: [id number]                                                                                      DATE: 04/16/2013  PCP: Gaspar Garbe, MD Referring Provider: Gaspar Garbe, MD  IMPRESSION:  Right breast mass, clinically worrisome for a carcinoma  PLAN:   we will arrange a repeat mammogram plus ultrasound and a likely biopsy of the mass in the right breast, 12:30 position, 8 cm from the nipple                 CC:  Chief Complaint  Patient presents with  . Breast Problem    HPI:  [name] French is a 79 y.o.  female who presents for evaluation of a right breast mass. Has been followed in our office for many years for cysts of the breasts. On numerous occasions these have been aspirated. She's not been here since 2005 however. She had a mammogram with a 3-D mammogram done about 6 months ago that was negative except for dense breasts. However about 2 months after that she's noticed an area in the right breast upper-inner quadrant it is a little bit painful a few nights ago. She came in today for evaluation of this , hoping that it is another cyst.PMH:  has a past medical history of Hemorrhoids; Arthritis; Allergic rhinitis; Rosacea; Diverticulosis; adenomatous colonic polyps (05/2002); Tubulovillous adenoma polyp of colon (09/2010); and Osteoporosis.  PSH:   has past surgical history that includes Wrist surgery (1990) and Breast biopsy (Left, 1960).  ALLERGIES:   Allergies  Allergen Reactions  . Codeine Itching    MEDICATIONS: Current outpatient prescriptions:aspirin 81 MG tablet, Take 81 mg by mouth daily., Disp: , Rfl: ;  Calcium Carbonate (CALTRATE 600 PO), Take by mouth daily.  , Disp: , Rfl: ;  Cholecalciferol (VITAMIN D PO), Take 1 tablet by mouth 2 (two) times daily.  , Disp: , Rfl: ;  DiphenhydrAMINE HCl, Sleep, (UNISOM SLEEPGELS PO), Take by mouth at bedtime. One at bedtime , Disp: , Rfl:  GLUCOSAMINE-CHONDROITIN PO, Take 1 tablet  by mouth daily.  , Disp: , Rfl: ;  pravastatin (PRAVACHOL) 40 MG tablet, , Disp: , Rfl: ;  Probiotic Product (ALIGN PO), Take 1 tablet by mouth daily.  , Disp: , Rfl: ;  Loperamide HCl (IMODIUM PO), Take 1 tablet by mouth as needed.  , Disp: , Rfl: ;  zolpidem (AMBIEN) 10 MG tablet, 10 mg. As needed, Disp: , Rfl:   ROS: She has filled out our 12 point review of systems and it is negative  . EXAM:   Vital signs:BP 104/70  Pulse 72  Resp 16  Ht 5\' 5"  (1.651 m)  Wt 145 lb (65.772 kg)  BMI 24.13 kg/m2 General: The patient alert oriented healthy-appearing, younger than her stated age Breasts: There are basically symmetric. There several well-healed scars on the left side. There is a 2 x 1 cm irregular firm mass in the right breast upper-inner quadrant 8 cm the nipple about the 12:30 position. It does not appear fixed to either skin or deep but the breast  is not very deep in this area. The skin and nipple areas look normal bilaterally. Lymphatics: There is no axillary or significant adenopathy on either side.  DATA REVIEWED:  I reviewed the old paper chart that we have dating back to 90. I've also reviewed her recent mammograms and 3-D mammogram. I did her office ultrasound this appears to be a 2 x 3 cm irregular mass, worrisome for carcinoma    Zohaib Heeney J 04/16/2013  CC: Tisovec, Adelfa Koh, MD, Gaspar Garbe, MD

## 2013-04-29 ENCOUNTER — Other Ambulatory Visit: Payer: Medicare Other

## 2013-05-02 ENCOUNTER — Other Ambulatory Visit (INDEPENDENT_AMBULATORY_CARE_PROVIDER_SITE_OTHER): Payer: Self-pay | Admitting: Surgery

## 2013-05-02 ENCOUNTER — Ambulatory Visit
Admission: RE | Admit: 2013-05-02 | Discharge: 2013-05-02 | Disposition: A | Discharge status: Home / Self Care | Payer: Medicare Other | Source: Ambulatory Visit | Attending: Surgery | Admitting: Surgery

## 2013-05-02 DIAGNOSIS — N6310 Unspecified lump in the right breast, unspecified quadrant: Secondary | ICD-10-CM

## 2013-05-02 DIAGNOSIS — R922 Inconclusive mammogram: Secondary | ICD-10-CM | POA: Diagnosis not present

## 2013-05-08 ENCOUNTER — Other Ambulatory Visit (INDEPENDENT_AMBULATORY_CARE_PROVIDER_SITE_OTHER): Payer: Self-pay | Admitting: Surgery

## 2013-05-08 ENCOUNTER — Ambulatory Visit
Admission: RE | Admit: 2013-05-08 | Discharge: 2013-05-08 | Disposition: A | Discharge status: Home / Self Care | Payer: Medicare Other | Source: Ambulatory Visit | Attending: Surgery | Admitting: Surgery

## 2013-05-08 DIAGNOSIS — C50919 Malignant neoplasm of unspecified site of unspecified female breast: Secondary | ICD-10-CM

## 2013-05-08 DIAGNOSIS — N6310 Unspecified lump in the right breast, unspecified quadrant: Secondary | ICD-10-CM

## 2013-05-08 DIAGNOSIS — N63 Unspecified lump in unspecified breast: Secondary | ICD-10-CM | POA: Diagnosis not present

## 2013-05-08 HISTORY — DX: Malignant neoplasm of unspecified site of unspecified female breast: C50.919

## 2013-05-09 ENCOUNTER — Encounter (INDEPENDENT_AMBULATORY_CARE_PROVIDER_SITE_OTHER): Payer: Self-pay

## 2013-05-09 ENCOUNTER — Other Ambulatory Visit (INDEPENDENT_AMBULATORY_CARE_PROVIDER_SITE_OTHER): Payer: Self-pay | Admitting: Surgery

## 2013-05-09 DIAGNOSIS — C50911 Malignant neoplasm of unspecified site of right female breast: Secondary | ICD-10-CM

## 2013-05-15 ENCOUNTER — Ambulatory Visit
Admission: RE | Admit: 2013-05-15 | Discharge: 2013-05-15 | Disposition: A | Discharge status: Home / Self Care | Payer: Medicare Other | Source: Ambulatory Visit | Attending: Surgery | Admitting: Surgery

## 2013-05-15 DIAGNOSIS — C50911 Malignant neoplasm of unspecified site of right female breast: Secondary | ICD-10-CM

## 2013-05-15 DIAGNOSIS — C50919 Malignant neoplasm of unspecified site of unspecified female breast: Secondary | ICD-10-CM | POA: Diagnosis not present

## 2013-05-15 MED ORDER — GADOBENATE DIMEGLUMINE 529 MG/ML IV SOLN
13.0000 mL | Freq: Once | INTRAVENOUS | Status: AC | PRN
  Administered 2013-05-15: 13 mL via INTRAVENOUS

## 2013-05-16 ENCOUNTER — Encounter (INDEPENDENT_AMBULATORY_CARE_PROVIDER_SITE_OTHER): Payer: Self-pay | Admitting: General Surgery

## 2013-05-16 ENCOUNTER — Other Ambulatory Visit (INDEPENDENT_AMBULATORY_CARE_PROVIDER_SITE_OTHER): Payer: Self-pay | Admitting: Surgery

## 2013-05-16 DIAGNOSIS — R928 Other abnormal and inconclusive findings on diagnostic imaging of breast: Secondary | ICD-10-CM

## 2013-05-16 NOTE — Telephone Encounter (Signed)
This encounter was created in error - please disregard.

## 2013-05-17 ENCOUNTER — Ambulatory Visit (INDEPENDENT_AMBULATORY_CARE_PROVIDER_SITE_OTHER): Payer: Medicare Other | Admitting: Surgery

## 2013-05-17 ENCOUNTER — Encounter (INDEPENDENT_AMBULATORY_CARE_PROVIDER_SITE_OTHER): Payer: Self-pay | Admitting: Surgery

## 2013-05-17 VITALS — BP 120/74 | HR 78 | Temp 98.2°F | Resp 18 | Ht 65.0 in | Wt 142.0 lb

## 2013-05-17 DIAGNOSIS — C50911 Malignant neoplasm of unspecified site of right female breast: Secondary | ICD-10-CM

## 2013-05-17 DIAGNOSIS — C50919 Malignant neoplasm of unspecified site of unspecified female breast: Secondary | ICD-10-CM | POA: Diagnosis not present

## 2013-05-17 NOTE — Progress Notes (Signed)
She comes back for followup today. She had a biopsy done of the palpable mass in the right breast and this is invasive ductal carcinoma. Receptor status is still pending. She tolerated the biopsy well. After that she had an MRI and a second adjacent lesion was noted in the right breast almost contiguous with the first. In addition an area of abnormality was seen in the left breast suspicious for DCIS. An MRI guided biopsy is scheduled for June 9.  Today's visit was spent entirely in counseling. We reviewed her new diagnosis, multiple treatment options including lumpectomy, mastectomy, anti-estrogen therapy, etc. Given the size of the tumor in the right breast I think a mastectomy will be indicated unless this can be treated with neoadjuvant hormonal or chemotherapy. Given her age of 74, she will need to investigate these options. If she is receptor negative then I think her best option will be mastectomy, as I think the tumor is too big for a lumpectomy. Decisions about the left side would need to be made once the biopsy report is available. I spent 20 minutes in counseling time today

## 2013-05-17 NOTE — Patient Instructions (Signed)
I will see you again after your next biopsy and we can make definite plans

## 2013-05-27 ENCOUNTER — Ambulatory Visit
Admission: RE | Admit: 2013-05-27 | Discharge: 2013-05-27 | Disposition: A | Discharge status: Home / Self Care | Payer: Medicare Other | Source: Ambulatory Visit | Attending: Surgery | Admitting: Surgery

## 2013-05-27 DIAGNOSIS — C50919 Malignant neoplasm of unspecified site of unspecified female breast: Secondary | ICD-10-CM | POA: Diagnosis not present

## 2013-05-27 DIAGNOSIS — R928 Other abnormal and inconclusive findings on diagnostic imaging of breast: Secondary | ICD-10-CM

## 2013-05-27 DIAGNOSIS — N6489 Other specified disorders of breast: Secondary | ICD-10-CM | POA: Diagnosis not present

## 2013-05-27 MED ORDER — GADOBENATE DIMEGLUMINE 529 MG/ML IV SOLN
13.0000 mL | Freq: Once | INTRAVENOUS | Status: AC | PRN
  Administered 2013-05-27: 13 mL via INTRAVENOUS

## 2013-05-31 ENCOUNTER — Ambulatory Visit (INDEPENDENT_AMBULATORY_CARE_PROVIDER_SITE_OTHER): Payer: Medicare Other | Admitting: Surgery

## 2013-05-31 ENCOUNTER — Encounter (INDEPENDENT_AMBULATORY_CARE_PROVIDER_SITE_OTHER): Payer: Self-pay | Admitting: Surgery

## 2013-05-31 VITALS — BP 116/70 | HR 82 | Temp 98.7°F | Resp 16 | Ht 65.0 in | Wt 141.6 lb

## 2013-05-31 DIAGNOSIS — C50911 Malignant neoplasm of unspecified site of right female breast: Secondary | ICD-10-CM

## 2013-05-31 DIAGNOSIS — N63 Unspecified lump in unspecified breast: Secondary | ICD-10-CM

## 2013-05-31 DIAGNOSIS — N6320 Unspecified lump in the left breast, unspecified quadrant: Secondary | ICD-10-CM

## 2013-05-31 DIAGNOSIS — C50919 Malignant neoplasm of unspecified site of unspecified female breast: Secondary | ICD-10-CM

## 2013-05-31 NOTE — Patient Instructions (Signed)
We will make arrangements for you to have a medical oncology consultation discuss the potential of doing some anti-hormone medication to reduce the size of the cancer before and make a decision to operate on it.

## 2013-05-31 NOTE — Progress Notes (Signed)
She came in for further discussions, so today was just a counseling session. We spent 20 minutes. The biopsy of the left side shows a sclerosing lesion and excisional biopsy is recommended. She also has the large cancer on the right side which is receptor positive. She was discussed with the breast cancer conference and she is a potential candidate for neoadjuvant hormonal therapy. We reviewed all the pros and cons of that and I'm going to make a medical oncology referral so she can get even more information and make an informed decision. If we proceed at this point with a mastectomy we will do a wire localized excision of the mass on the left side. If she doesn't neoadjuvant therapy, it would be appropriate just to wait and follow this lesion and excise it at the time of her definitive surgery after neoadjuvant therapy. I think all her questions have been answered today.

## 2013-05-31 NOTE — Addendum Note (Signed)
Addended byLiliana Cline on: 05/31/2013 01:46 PM   Modules accepted: Orders

## 2013-06-04 ENCOUNTER — Telehealth (INDEPENDENT_AMBULATORY_CARE_PROVIDER_SITE_OTHER): Payer: Self-pay

## 2013-06-04 NOTE — Telephone Encounter (Signed)
Spoke with pt - pt concerned that she has not heard from anyone in regards to her oncology appt.   I explained to the pt she should be getting a call from [facility] because she is who we refer new breast cancer pt's to.  I also told the pt that if she does not hear from [facility] by Friday to call our office back and we will attempt to see what is taking so long.  Pt appreciated my call

## 2013-06-07 ENCOUNTER — Telehealth: Payer: Self-pay | Admitting: *Deleted

## 2013-06-07 NOTE — Telephone Encounter (Signed)
Confirmed 06/10/13 appt w/ pt.  Unable to mail before appt letter & packet - gave verbal and put in appts notes to give pt one at time of check in.  Emailed Jade at [facility] to make aware.  Took paperwork to Med [facility] for chart.

## 2013-06-10 ENCOUNTER — Other Ambulatory Visit (HOSPITAL_BASED_OUTPATIENT_CLINIC_OR_DEPARTMENT_OTHER): Payer: Medicare Other

## 2013-06-10 ENCOUNTER — Ambulatory Visit (HOSPITAL_BASED_OUTPATIENT_CLINIC_OR_DEPARTMENT_OTHER): Payer: Medicare Other | Admitting: Oncology

## 2013-06-10 ENCOUNTER — Ambulatory Visit: Payer: Medicare Other

## 2013-06-10 ENCOUNTER — Encounter: Payer: Self-pay | Admitting: Oncology

## 2013-06-10 ENCOUNTER — Other Ambulatory Visit: Payer: Self-pay | Admitting: *Deleted

## 2013-06-10 ENCOUNTER — Telehealth: Payer: Self-pay | Admitting: Oncology

## 2013-06-10 VITALS — BP 146/74 | HR 73 | Temp 98.1°F | Resp 20 | Ht 64.5 in | Wt 141.7 lb

## 2013-06-10 DIAGNOSIS — C50212 Malignant neoplasm of upper-inner quadrant of left female breast: Secondary | ICD-10-CM

## 2013-06-10 DIAGNOSIS — C50211 Malignant neoplasm of upper-inner quadrant of right female breast: Secondary | ICD-10-CM | POA: Insufficient documentation

## 2013-06-10 DIAGNOSIS — C50219 Malignant neoplasm of upper-inner quadrant of unspecified female breast: Secondary | ICD-10-CM

## 2013-06-10 LAB — CBC WITH DIFFERENTIAL/PLATELET
BASO%: 1.4 % (ref 0.0–2.0)
Eosinophils Absolute: 0.2 10*3/uL (ref 0.0–0.5)
HCT: 41.9 % (ref 34.8–46.6)
LYMPH%: 31.8 % (ref 14.0–49.7)
MCHC: 33.6 g/dL (ref 31.5–36.0)
MCV: 87.8 fL (ref 79.5–101.0)
MONO#: 0.6 10*3/uL (ref 0.1–0.9)
MONO%: 8.5 % (ref 0.0–14.0)
NEUT%: 54.9 % (ref 38.4–76.8)
Platelets: 231 10*3/uL (ref 145–400)
RBC: 4.78 10*6/uL (ref 3.70–5.45)
WBC: 6.7 10*3/uL (ref 3.9–10.3)

## 2013-06-10 LAB — COMPREHENSIVE METABOLIC PANEL (CC13)
Alkaline Phosphatase: 56 U/L (ref 40–150)
CO2: 27 mEq/L (ref 22–29)
Creatinine: 0.9 mg/dL (ref 0.6–1.1)
Glucose: 95 mg/dl (ref 70–99)
Sodium: 141 mEq/L (ref 136–145)
Total Bilirubin: 0.71 mg/dL (ref 0.20–1.20)

## 2013-06-10 MED ORDER — LETROZOLE 2.5 MG PO TABS: 2.5000 mg | ORAL_TABLET | Freq: Every day | ORAL | Status: DC

## 2013-06-10 NOTE — Patient Instructions (Addendum)
#1 We discussed your diagnosis, radiology, and pathology of breast cancer.  #2 We discussed that you have a stage II breast cancer based on the size of the tumor which is 2.8 cm by MRI.  #3 We discussed the rationale for doing preop antiestrogen therapy with letrozole 2.5 mg daily. (Femara).  #4 we discussed length of therapy. You will most likely receive a total of 9 months of Femara initially prior to surgery.  #5 at months numbers 6 and 9 we will repeat MRIs of your breasts to evaluate response to therapy.  #6 I will plan on seeing you back every 63 months for followup and to assess any side effects that you may possibly experience with Femara.  #7 we discussed the potential side effects and more information is noted below  Letrozole tablets What is this medicine? LETROZOLE (LET roe zole) blocks the production of estrogen. Certain types of breast cancer grow under the influence of estrogen. Letrozole helps block tumor growth. This medicine is used to treat advanced breast cancer in postmenopausal women. This medicine may be used for other purposes; ask your health care provider or pharmacist if you have questions. What should I tell my health care provider before I take this medicine? They need to know if you have any of these conditions: -liver disease -osteoporosis (weak bones) -an unusual or allergic reaction to letrozole, other medicines, foods, dyes, or preservatives -pregnant or trying to get pregnant -breast-feeding How should I use this medicine? Take this medicine by mouth with a glass of water. You may take it with or without food. Follow the directions on the prescription label. Take your medicine at regular intervals. Do not take your medicine more often than directed. Do not stop taking except on your doctor's advice. Talk to your pediatrician regarding the use of this medicine in children. Special care may be needed. Overdosage: If you think you have taken too much of  this medicine contact a poison control center or emergency room at once. NOTE: This medicine is only for you. Do not share this medicine with others. What if I miss a dose? If you miss a dose, take it as soon as you can. If it is almost time for your next dose, take only that dose. Do not take double or extra doses. What may interact with this medicine? Do not take this medicine with any of the following medications: -estrogens, like hormone replacement therapy or birth control pills This medicine may also interact with the following medications: -dietary supplements such as androstenedione or DHEA -prasterone -tamoxifen This list may not describe all possible interactions. Give your health care provider a list of all the medicines, herbs, non-prescription drugs, or dietary supplements you use. Also tell them if you smoke, drink alcohol, or use illegal drugs. Some items may interact with your medicine. What should I watch for while using this medicine? Visit your doctor or health care professional for regular check-ups to monitor your condition. Do not use this drug if you are pregnant. Serious side effects to an unborn child are possible. Talk to your doctor or pharmacist for more information. You may get drowsy or dizzy. Do not drive, use machinery, or do anything that needs mental alertness until you know how this medicine affects you. Do not stand or sit up quickly, especially if you are an older patient. This reduces the risk of dizzy or fainting spells. What side effects may I notice from receiving this medicine? Side effects that you should  report to your doctor or health care professional as soon as possible: -allergic reactions like skin rash, itching, or hives -bone fracture -chest pain -difficulty breathing or shortness of breath -severe pain, swelling, warmth in the leg -unusually weak or tired -vaginal bleeding Side effects that usually do not require medical attention (report  to your doctor or health care professional if they continue or are bothersome): -bone, back, joint, or muscle pain -dizziness -fatigue -fluid retention -headache -hot flashes, night sweats -nausea -weight gain This list may not describe all possible side effects. Call your doctor for medical advice about side effects. You may report side effects to FDA at 1-800-FDA-1088. Where should I keep my medicine? Keep out of the reach of children. Store between 15 and 30 degrees C (59 and 86 degrees F). Throw away any unused medicine after the expiration date. NOTE: This sheet is a summary. It may not cover all possible information. If you have questions about this medicine, talk to your doctor, pharmacist, or health care provider.  2012, [employer] Standard. (02/15/2008 4:43:44 PM)

## 2013-06-10 NOTE — Progress Notes (Signed)
[name] French [id number] [birth year] 79 y.o. 06/10/2013 1:55 PM  CC  Gaspar Garbe, MD [address] [place], [place]. [place] [address] Dr. Cyndia Bent  REASON FOR CONSULTATION:  79 year old with new diagnosis of right breast cancer, being seen for for discussion of treatment option  STAGE:  Right breast 3.5 cm, ER positive PR positive HER-2/neu negative T2 NX MX Upper inner quadrant of the right breast middle to posterior thirds at the 1:00 position 8 cm from the right nipple  REFERRING PHYSICIAN: Dr. Cyndia Bent  HISTORY OF PRESENT ILLNESS:  [name] French is a 79 y.o. female.  Who has been getting screening mammograms for quite some time. Her last normal screening mammogram was in October 2013 there were no suspicious masses or architectural distortion. In May 2014 patient felt a mass in her right upper-inner quadrant. Because of this she had a diagnostic mammogram and an ultrasound performed. The mammogram showed a spiculated irregular mass measuring 2.5 cm in the upper inner quadrant of the right breast corresponding to the palpable finding. Ultrasound showed hypoechoic irregular spiculated mass in the right breast 1:00 location 8 cm from the nipple measuring 2.0 x 1.8 x 1.2 cm.she was recommended a biopsy. She had this done on 05/08/2013. The pathology showed a grade 1-2 invasive mammary carcinoma. Tumor was ER +100% PR +93% proliferation marker Ki-67 30% and HER-2/neu negative. She went on to have MRIs of the breasts performed the MRI in the right breast in the upper inner quadrant middle to posterior third showed irregular mass measuring 2.3 x 2.5 x 2.8 cm. A second smaller but similar appearing irregular enhancing mass was seen at the anterior to the inferior margin of the biopsied mass. The 2 masses were contiguous separated by only 2-3 mm. The second measured 1.2 x 1.1 x 1.0. In total both measured 3.8 cm in the AP diameter. In the left  breast there was a linear area of enhancement in a ductal distribution in the upper inner quadrant of the right breast measuring 1.5 cm. 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 This is in a radial scar.  Patient was seen by Dr. Cyndia Bent. He discussed surgical options mastectomy versus a lumpectomy. He recommended that patient be seen in medical oncology for possible discussion of neoadjuvant antiestrogen therapy as patient didn't desire breast conservation. Patient's case was discussed at the multidisciplinary breast conference one week ago. She herself is without any complaints. She was seen by herself there was no one accompanying her.   Past Medical History: Past Medical History  Diagnosis Date  . Hemorrhoids   . Arthritis   . Allergic rhinitis   . Rosacea   . Diverticulosis   . Hx of adenomatous colonic polyps 05/2002  . 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 Osteoporosis     Past Surgical History: Past Surgical History  Procedure Laterality Date  . Wrist surgery  1990  . Breast biopsy Left 1960    Family History: Family History  Problem Relation Age of Onset  . Prostate cancer Father   . Lung cancer Sister   . Heart disease Mother     Social History History  Substance Use Topics  . Smoking status: Former Smoker    Quit date: 12/19/1978  . Smokeless tobacco: Never Used  . Alcohol  Use: Yes     Comment: rare- wine 2-3 times weekly    Allergies: Allergies  Allergen Reactions  . Codeine Itching  . Neosporin (Neomycin-Bacitracin Zn-Polymyx) Rash    Current Medications: Current Outpatient Prescriptions  Medication Sig Dispense Refill  . aspirin 81 MG tablet Take 81 mg by mouth daily.      . Calcium Carbonate (CALTRATE 600 PO) Take by mouth daily.        .  Cholecalciferol (VITAMIN D PO) Take 1 tablet by mouth 2 (two) times daily.        Marland Kitchen GLUCOSAMINE-CHONDROITIN PO Take 1 tablet by mouth daily.       . Loperamide HCl (IMODIUM PO) Take 1 tablet by mouth as needed.        . pravastatin (PRAVACHOL) 40 MG tablet       . Probiotic Product (ALIGN PO) Take 1 tablet by mouth daily.        Marland Kitchen zolpidem (AMBIEN) 10 MG tablet 10 mg. As needed      . DiphenhydrAMINE HCl, Sleep, (UNISOM SLEEPGELS PO) Take by mouth at bedtime. One at bedtime        No current facility-administered medications for this visit.    OB/GYN History: menarche at age14, menopause in 35, HRT x 25, age at first live birth 17's G51P3  Fertility Discussion:n/a Prior History of Cancer: none  Health Maintenance:  Colonoscopy yes  Bone Density yes (osteopenia) Last PAP smear yes  ECOG PERFORMANCE STATUS: 0 - Asymptomatic  Genetic Counseling/testing:no  REVIEW OF SYSTEMS:  Patient is well-appearing. She had a 14 point review of systems performed that was negative and is scant separately into the electronic medical record  PHYSICAL EXAMINATION: Blood pressure 146/74, pulse 73, temperature 98.1 F (36.7 C), temperature source Oral, resp. rate 20, height 5' 4.5" (1.638 m), weight 141 lb 11.2 oz (64.275 kg). Patient is well-developed well-nourished female appearing younger than her stated age in no acute distress HEENT exam EOMI PERRLA sclerae anicteric no conjunctival pallor oral mucosa is moist neck is supple lungs are clear bilaterally cardiovascular is regular rate rhythm abdomen is soft nontender no HSM extremities no edema neuro patient's alert oriented otherwise nonfocal  breast examination right breast reveals a palpable mass about3 cm in greatest diameter in the upper inner quadrant at the 1:00 position. No nipple discharge no other masses. Left breast no masses or nipple discharge.     STUDIES/RESULTS: Mr Breast Bilateral W Wo Contrast  05/15/2013   *RADIOLOGY REPORT*   Clinical Data: Recent diagnosis of right breast cancer.  BUN and creatinine were obtained on site at [facility] at [address] Results:  BUN 10 mg/dL,  Creatinine 0.8 mg/dL.  BILATERAL BREAST MRI WITH AND WITHOUT CONTRAST  Technique: Multiplanar, multisequence MR images of both breasts were obtained prior to and following the intravenous administration of 13ml of Multihance.  Three dimensional images were evaluated at the independent DynaCad workstation.  Comparison:  Right mammogram and right breast ultrasound 05/02/2013, post-biopsy clip mammogram of the right breast 05/08/2013 and left mammogram 05/15/2013  Findings: There is a marked, complex parenchymal enhancement pattern in both breasts.  This can limit the sensitivity for detecting malignancy.  Right breast:  In the upper inner quadrant of the right breast, middle to posterior thirds, is an irregular mass with washout kinetics and central biopsy clip artifact that measures 2.3 x 2.5 x 2.8 cm (AP by transverse by craniocaudal).  A second smaller but similar-appearing irregular enhancing mass  with washout kinetics is seen directly anterior to the inferior margin of the biopsied mass. The two masses are nearly contiguous, separated by only 2 to 3 mm. The second mass measures 1.2 x 1.1 x 1.0 cm.  In total, both masses measures 3.8 cm AP diameter.  Left breast:  There is a linear area of enhancement in a ductal distribution in the upper inner quadrant of the right breast that measures 1.5 cm AP diameter, and has washout kinetics.  Ductal carcinoma in situ cannot be excluded.  No suspicious axillary or internal mammary chain lymphadenopathy is identified.  IMPRESSION:  1.  Biopsy-proven 2.8 x 2.5 x 2.3 cm malignancy in the upper inner quadrant of the right breast.  An immediately-adjacent second mass is seen along the anterior inferior margin of the dominant mass as described above and is highly suspicious for malignancy. 2.  1.5 cm area of linear  ductal enhancement the upper inner quadrant of the left breast.  Ductal carcinoma in situ cannot be excluded.  MRI guided core needle biopsy is suggested.  The patient will be contacted to schedule this procedure.  RECOMMENDATION: MRI guided biopsy of the left breast.  THREE-DIMENSIONAL MR IMAGE RENDERING ON INDEPENDENT WORKSTATION:  Three-dimensional MR images were rendered by post-processing of the original MR data on an independent workstation.  The three- dimensional MR images were interpreted, and findings were reported in the accompanying complete MRI report for this study.  BI-RADS CATEGORY 4:  Suspicious abnormality - biopsy should be considered.   Original Report Authenticated By: Britta Mccreedy, M.D.   Mm Digital Diagnostic Unilat L  05/27/2013   *RADIOLOGY REPORT*  Clinical Data:  MRI guided biopsy was performed of linear enhancement upper inner quadrant of the left breast today.  DIGITAL DIAGNOSTIC LEFT MAMMOGRAM  Comparison:  Previous exams.  Findings:  Films are performed following MRI guided biopsy of linear enhancement upper inner quadrant of the left breast.  A dumbbell-shaped biopsy clip is satisfactorily positioned in the upper inner quadrant of the left breast at the site of expected biopsy changes.  IMPRESSION: Satisfactory position of biopsy clip.   Original Report Authenticated By: Britta Mccreedy, M.D.   Mm Digital Diagnostic Unilat L  05/15/2013   *RADIOLOGY REPORT*  Clinical Data:  Recently diagnosed right breast cancer.  Screening of the left breast for correlation with bilateral breast MRI.  DIGITAL DIAGNOSTIC LEFT MAMMOGRAM WITH CAD  Comparison: With priors  Findings:  ACR Breast Density Category 3: The breast tissue is heterogeneously dense.  There is stable surgical scarring in the outer left breast in this patient with history of prior excisional biopsy.  There are multiple stable appearing scattered and clustered calcifications. No suspicious mass, nonsurgical distortion, or  suspicious microcalcification is identified to suggest malignancy.  Mammographic images were processed with CAD.  IMPRESSION: Stable mammogram.  No mammographic evidence of malignancy  RECOMMENDATION: Mammogram in 1 year is recommended.  I have discussed the findings and recommendations with the patient. Results were also provided in writing at the conclusion of the visit.  If applicable, a reminder letter will be sent to the patient regarding her next appointment.  BI-RADS CATEGORY 2:  Benign finding(s).   Original Report Authenticated By: Britta Mccreedy, M.D.   Mr Oswaldo Milian Breast Bx Jones Bales Dev 1st Lesion Image Bx Spec Mr Guide  05/30/2013   **ADDENDUM** CREATED: 05/30/2013 17:27:08  Final pathology results show a complex sclerosing lesion and lobular neoplasia (atypical hyperplasia) and microcalcifications at the left breast biopsy site  performed under MRI guidance. Pathology results are concordant with imaging findings.  Surgical excision is suggested.  The patient is being seen by Dr. Jamey Ripa.  I discussed these final pathology results with the patient today by telephone.  **END ADDENDUM** SIGNED BY: Britta Mccreedy, M.D.  05/29/2013   **ADDENDUM** CREATED: 05/29/2013 12:10:29  I spoke with the patient by telephone on 05/29/2013 to discuss pathology results. Preliminary pathology report demonstrates complex sclerosing lesion and possible atypical lobular hyperplasia (additional stains are pending).  The patient reports no problems at the biopsy site.  All questions were answered.  Recommendations:  Surgical excision is recommended.  The patient has known right breast cancer and is planning right mastectomy. She is scheduled to see Dr. Jamey Ripa for surgical consultation on 05/31/2013.  **END ADDENDUM** SIGNED BY: Blair Hailey. Manson Passey, M.D.  05/27/2013   *RADIOLOGY REPORT*  Clinical Data:  Recent diagnosis of right breast cancer  MRI GUIDED VACUUM ASSISTED BIOPSY OF THE LEFT BREAST WITHOUT AND WITH CONTRAST  Comparison:  Previous exams.  Technique: Multiplanar, multisequence MR images of the left breast were obtained prior to and following the intravenous administration of 13 ml of Mulithance.  I met with the patient, and we discussed the procedure of MRI guided biopsy, including risks, benefits, and alternatives. Specifically, we discussed the risks of infection, bleeding, tissue injury, clip migration, and inadequate sampling.  Informed, written consent was given.  Using sterile technique, 2% Lidocaine, MRI guidance, and a 9 gauge vacuum assisted device, biopsy was performed of linear enhancement the upper inner quadrant of the left breast using a medial to lateral approach.  At the conclusion of the procedure, a a dumbbell shaped tissue marker clip was deployed into the biopsy cavity.  IMPRESSION: MRI guided biopsy of the left breast. No apparent complications.  THREE-DIMENSIONAL MR IMAGE RENDERING ON INDEPENDENT WORKSTATION:  Three-dimensional MR images were rendered by post-processing of the original MR data on an independent workstation.  The three- dimensional MR images were interpreted, and findings were reported in the accompanying complete MRI report for this study.   Original Report Authenticated By: Britta Mccreedy, M.D.     LABS:    Chemistry      Component Value Date/Time   NA 141 06/10/2013 1246   K 3.9 06/10/2013 1246   CL 104 06/10/2013 1246   CO2 27 06/10/2013 1246   BUN 15.0 06/10/2013 1246   CREATININE 0.9 06/10/2013 1246      Component Value Date/Time   CALCIUM 10.0 06/10/2013 1246   ALKPHOS 56 06/10/2013 1246   AST 14 06/10/2013 1246   ALT 12 06/10/2013 1246   BILITOT 0.71 06/10/2013 1246      Lab Results  Component Value Date   WBC 6.7 06/10/2013   HGB 14.1 06/10/2013   HCT 41.9 06/10/2013   MCV 87.8 06/10/2013   PLT 231 06/10/2013   PATHOLOGY: ADDITIONAL INFORMATION: CHROMOGENIC IN-SITU HYBRIDIZATION Results: HER-2/NEU BY CISH - NO AMPLIFICATION OF HER-2 DETECTED. RESULT RATIO OF HER2: CEP  17 SIGNALS 0.95 AVERAGE HER2 COPY NUMBER PER CELL 2.0 REFERENCE RANGE NEGATIVE HER2/Chr17 Ratio <2.0 and Average HER2 copy number <4.0 EQUIVOCAL HER2/Chr17 Ratio <2.0 and Average HER2 copy number 4.0 and <6.0 POSITIVE HER2/Chr17 Ratio >=2.0 and/or Average HER2 copy number >=6.0 Jimmy Picket MD Pathologist, Electronic Signature ( Signed 05/20/2013) PROGNOSTIC INDICATORS - ACIS Results: IMMUNOHISTOCHEMICAL AND MORPHOMETRIC ANALYSIS BY THE AUTOMATED CELLULAR IMAGING SYSTEM (ACIS) Estrogen Receptor: 100%, POSITIVE, STRONG STAINING INTENSITY Progesterone Receptor: 93%, POSITIVE, STRONG STAINING INTENSITY Proliferation Marker Ki67:  30% REFERENCE RANGE ESTROGEN RECEPTOR NEGATIVE <1% POSITIVE =>1% PROGESTERONE RECEPTOR NEGATIVE <1% 1 of 3 FINAL for Wegner, Rhylan S ([id number]) ADDITIONAL INFORMATION:(continued) POSITIVE =>1% All controls stained appropriately Pecola Leisure MD Pathologist, Electronic Signature ( Signed 05/20/2013) FINAL DIAGNOSIS Diagnosis Breast, right, needle core biopsy, mass, 1 o'clock, 8 cm / nipple - INVASIVE MAMMARY CARCINOMA - SEE COMMENT. Microscopic Comment Although definitive grading of breast carcinoma is best done on excision, the features of the invasive tumor from the 1 o'clock 8 cm from the nipple are compatible with a grade I-II breast carcinoma. Breast prognostic markers will be performed and reported in an addendum. Findings are called to the [facility] on 05/09/13. Dr. Colonel Bald has seen this case in consultation with agreement. (RAH:gt, 05/09/13) Zandra Abts MD Pathologist, Electronic Signature (Case signed 05/09/2013) Specimen Gross and Clinical Information Diagnosis Breast, left, needle core biopsy, upper inner quadrant - COMPLEX SCLEROSING LESION, SEE COMMENT. - LOBULAR NEOPLASIA (ATYPICAL HYPERPLASIA). - MICROCALCIFICATIONS IDENTIFIED. Microscopic Comment Differential considerations include intraductal papilloma and  radial scar. The case was reviewed with Dr. Colonel Bald who concurs. (CRR:gt, 05/30/13) [place]  ASSESSMENT    79 year old female with  #1 new diagnosis of invasive mammary carcinoma of the right breast measuring by MRI 3.8 cm in the greatest dimension. Patient is status post biopsy that revealed ER positive PR positive HER-2/neu negative disease with a Ki-67 of 30%. Patient's left breast was also biopsied 41.5 cm area of concern which showed complex sclerosing lesion with lobular neoplasia (atypical hyperplasia) with microcalcifications.  #2 patient and I discussed her radiology pathology and treatment options. She has  been seen either Dr.Streck for a surgical opinion. She does desire breast conservation. I do believe that she would be a good candidate for antiestrogen therapy upfront in the neoadjuvant or preop setting. We discussed the rationale for this extensively. We discussed length of therapy to about 9 months. She will be followed by me every 3 months. We will repeat MRI to evaluate response to therapy at 3 months and 9 months. She understands the rationale for this as well.  #3 recommendation is for letrozole 2.5 mg daily. We discussed side effects including but not limited to aches pains osteopenia myalgias and arthralgias, hot flashes, liver abnormalities for which we will be following her LFTs periodically.detailed literature was also given to the patient.  #4 patient's prescription was sent to her [facility] with 6 refills.  Clinical Trial Eligibility: no Multidisciplinary conference discussion yes     PLAN:    #1 proceed with letrozole 2.5 mg neoadjuvant daily.  #2 return in 3 months time for followup.  #3 patient will call with any problems questions or concerns.        Discussion: Patient is being treated per NCCN breast cancer care guidelines appropriate for stage.II because patient desires breast conservation she was not treated with lumpectomy upfront but instead she is  receiving neoadjuvant antiestrogen therapy with letrozole   Thank you so much for allowing me to participate in the care of [name] French. I will continue to follow up the patient with you and assist in her care.  All questions were answered. The patient knows to call the clinic with any problems, questions or concerns. We can certainly see the patient much sooner if necessary.  I spent 55 minutes counseling the patient face to face. The total time spent in the appointment was 60 minutes.  Drue Second, MD Medical/Oncology [facility] [phone] (beeper) [phone] (Office)  06/10/2013, 1:56 PM

## 2013-06-10 NOTE — Progress Notes (Signed)
Checked in new patient with no financial issues. Didn't ask if POA/living will. She wants communication via mail and phone, but said ok to use email also. I gave her the [facility] form.

## 2013-06-29 ENCOUNTER — Encounter: Payer: Self-pay | Admitting: *Deleted

## 2013-06-29 NOTE — Progress Notes (Signed)
Mailed after appt letter to pt. 

## 2013-07-11 DIAGNOSIS — D2330 Other benign neoplasm of skin of unspecified part of face: Secondary | ICD-10-CM | POA: Diagnosis not present

## 2013-07-11 DIAGNOSIS — D1801 Hemangioma of skin and subcutaneous tissue: Secondary | ICD-10-CM | POA: Diagnosis not present

## 2013-07-11 DIAGNOSIS — L570 Actinic keratosis: Secondary | ICD-10-CM | POA: Diagnosis not present

## 2013-07-11 DIAGNOSIS — L821 Other seborrheic keratosis: Secondary | ICD-10-CM | POA: Diagnosis not present

## 2013-07-11 DIAGNOSIS — Z85828 Personal history of other malignant neoplasm of skin: Secondary | ICD-10-CM | POA: Diagnosis not present

## 2013-07-12 DIAGNOSIS — H0019 Chalazion unspecified eye, unspecified eyelid: Secondary | ICD-10-CM | POA: Diagnosis not present

## 2013-07-12 DIAGNOSIS — H04129 Dry eye syndrome of unspecified lacrimal gland: Secondary | ICD-10-CM | POA: Diagnosis not present

## 2013-07-12 DIAGNOSIS — H01009 Unspecified blepharitis unspecified eye, unspecified eyelid: Secondary | ICD-10-CM | POA: Diagnosis not present

## 2013-07-12 DIAGNOSIS — H259 Unspecified age-related cataract: Secondary | ICD-10-CM | POA: Diagnosis not present

## 2013-07-24 DIAGNOSIS — H01009 Unspecified blepharitis unspecified eye, unspecified eyelid: Secondary | ICD-10-CM | POA: Diagnosis not present

## 2013-07-24 DIAGNOSIS — H259 Unspecified age-related cataract: Secondary | ICD-10-CM | POA: Diagnosis not present

## 2013-07-24 DIAGNOSIS — H0019 Chalazion unspecified eye, unspecified eyelid: Secondary | ICD-10-CM | POA: Diagnosis not present

## 2013-07-30 DIAGNOSIS — H269 Unspecified cataract: Secondary | ICD-10-CM | POA: Diagnosis not present

## 2013-07-30 DIAGNOSIS — H25019 Cortical age-related cataract, unspecified eye: Secondary | ICD-10-CM | POA: Diagnosis not present

## 2013-07-30 DIAGNOSIS — H2510 Age-related nuclear cataract, unspecified eye: Secondary | ICD-10-CM | POA: Diagnosis not present

## 2013-08-13 DIAGNOSIS — H269 Unspecified cataract: Secondary | ICD-10-CM | POA: Diagnosis not present

## 2013-08-13 DIAGNOSIS — H25039 Anterior subcapsular polar age-related cataract, unspecified eye: Secondary | ICD-10-CM | POA: Diagnosis not present

## 2013-08-13 DIAGNOSIS — H2510 Age-related nuclear cataract, unspecified eye: Secondary | ICD-10-CM | POA: Diagnosis not present

## 2013-09-16 ENCOUNTER — Encounter: Payer: Self-pay | Admitting: Oncology

## 2013-09-16 ENCOUNTER — Ambulatory Visit (HOSPITAL_BASED_OUTPATIENT_CLINIC_OR_DEPARTMENT_OTHER): Payer: Medicare Other | Admitting: Oncology

## 2013-09-16 ENCOUNTER — Other Ambulatory Visit (HOSPITAL_BASED_OUTPATIENT_CLINIC_OR_DEPARTMENT_OTHER): Payer: Medicare Other

## 2013-09-16 ENCOUNTER — Telehealth: Payer: Self-pay | Admitting: Oncology

## 2013-09-16 VITALS — BP 130/68 | HR 77 | Temp 97.8°F | Resp 18 | Ht 64.0 in | Wt 142.2 lb

## 2013-09-16 DIAGNOSIS — C50219 Malignant neoplasm of upper-inner quadrant of unspecified female breast: Secondary | ICD-10-CM

## 2013-09-16 DIAGNOSIS — C50212 Malignant neoplasm of upper-inner quadrant of left female breast: Secondary | ICD-10-CM

## 2013-09-16 LAB — COMPREHENSIVE METABOLIC PANEL (CC13)
BUN: 15.3 mg/dL (ref 7.0–26.0)
CO2: 27 mEq/L (ref 22–29)
Creatinine: 0.9 mg/dL (ref 0.6–1.1)
Glucose: 99 mg/dl (ref 70–140)
Sodium: 142 mEq/L (ref 136–145)
Total Bilirubin: 0.67 mg/dL (ref 0.20–1.20)
Total Protein: 7.2 g/dL (ref 6.4–8.3)

## 2013-09-16 LAB — CBC WITH DIFFERENTIAL/PLATELET
Basophils Absolute: 0.1 10*3/uL (ref 0.0–0.1)
Eosinophils Absolute: 0.2 10*3/uL (ref 0.0–0.5)
HCT: 43.3 % (ref 34.8–46.6)
LYMPH%: 36 % (ref 14.0–49.7)
MCV: 87.8 fL (ref 79.5–101.0)
MONO#: 0.5 10*3/uL (ref 0.1–0.9)
MONO%: 9.2 % (ref 0.0–14.0)
NEUT#: 2.9 10*3/uL (ref 1.5–6.5)
NEUT%: 49.6 % (ref 38.4–76.8)
Platelets: 238 10*3/uL (ref 145–400)
WBC: 5.8 10*3/uL (ref 3.9–10.3)

## 2013-09-16 NOTE — Telephone Encounter (Signed)
[name] French [name] French appt schedule for November including appt w/Dr. Jamey Ripa. lmonvm for [name] French @ [facility] re mri appt in open machine prior to appt w/Dr. Jamey Ripa. [name] French is not available for any appts 11/13 and 11/14. Dr. Jamey Ripa is not available 11/17 or 11/12 so [name] French was scheduled for 11/11. [name] French aware Lynden Ang will contact her re mri appt and although mri has an expected date of 11/17 mri is needed prior to appt w/Dr. Jamey Ripa. Lynden Ang also made aware of this in vm and asked to call me if she has any questions.

## 2013-09-16 NOTE — Patient Instructions (Addendum)
Continue the letrozole  MRI of breasts in November with follow up with Dr. Jamey Ripa after MRI  I will see you back after MRI

## 2013-09-16 NOTE — Progress Notes (Signed)
OFFICE PROGRESS NOTE  CC  Gaspar Garbe, MD [address] [facility], [place]. [place] [address] Dr. Cyndia Bent  DIAGNOSIS: 79 year old with new diagnosis of right breast cancer,   STAGE:  Right breast  3.5 cm, ER positive PR positive HER-2/neu negative  T2 NX MX  Upper inner quadrant of the right breast middle to posterior thirds at the 1:00 position 8 cm from the right nipple   PRIOR THERAPY: #1In May 2014 patient felt a mass in her right upper-inner quadrant. Because of this she had a diagnostic mammogram and an ultrasound performed. The mammogram showed a spiculated irregular mass measuring 2.5 cm in the upper inner quadrant of the right breast corresponding to the palpable finding. Ultrasound showed hypoechoic irregular spiculated mass in the right breast 1:00 location 8 cm from the nipple measuring 2.0 x 1.8 x 1.2 cm.she was recommended a biopsy. She had this done on 05/08/2013. The pathology showed a grade 1-2 invasive mammary carcinoma. Tumor was ER +100% PR +93% proliferation marker Ki-67 30% and HER-2/neu negative. She went on to have MRIs of the breasts performed the MRI in the right breast in the upper inner quadrant middle to posterior third showed irregular mass measuring 2.3 x 2.5 x 2.8 cm. A second smaller but similar appearing irregular enhancing mass was seen at the anterior to the inferior margin of the biopsied mass. The 2 masses were contiguous separated by only 2-3 mm. The second measured 1.2 x 1.1 x 1.0. In total both measured 3.8 cm in the AP diameter. In the left breast there was a linear area of enhancement in a ductal distribution in the upper inner quadrant of the right breast measuring 1.5 cm. 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 This is in a radial  scar.  #2 patient was interested in breast conservation and therefore she was begun on neoadjuvant antiestrogen therapy with letrozole 2.5 mg daily.   CURRENT THERAPY: Letrozole 2.5 mg daily since 06/10/2013  INTERVAL HISTORY: [name] French 79 y.o. female returns for followup visit at 3 months. Overall she's doing well. She's tolerating the letrozole without any significant problems. She denies any headaches double vision blurring of vision no difficulty in swallowing she has not experienced any is hot flashes no night sweats no myalgias and arthralgias. She has not noticed any nausea vomiting fevers no abdominal pain no diarrhea or constipation no skin rashes. Remainder of the 10 point review of systems is negative.  MEDICAL HISTORY: Past Medical History  Diagnosis Date  . Hemorrhoids   . Arthritis   . Allergic rhinitis   . Rosacea   . Diverticulosis   . Hx of adenomatous colonic polyps 05/2002  . 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 Osteoporosis     ALLERGIES:  is allergic to codeine and neosporin.  MEDICATIONS:  Current Outpatient Prescriptions  Medication Sig Dispense Refill  . aspirin 81 MG tablet Take 81 mg by mouth daily.      . Calcium Carbonate (CALTRATE 600 PO) Take by mouth daily.        . Cholecalciferol (VITAMIN D PO) Take 1 tablet by mouth 2 (two) times daily.        . DiphenhydrAMINE HCl, Sleep, (UNISOM SLEEPGELS PO) Take by mouth at bedtime. One at bedtime       .  GLUCOSAMINE-CHONDROITIN PO Take 1 tablet by mouth daily.       Marland Kitchen letrozole (FEMARA) 2.5 MG tablet Take 1 tablet (2.5 mg total) by mouth daily.  90 tablet  6  . Loperamide HCl (IMODIUM PO) Take 1 tablet by mouth as needed.        . pravastatin (PRAVACHOL) 40 MG tablet       . Probiotic Product (ALIGN PO) Take 1 tablet by mouth daily.        Marland Kitchen zolpidem (AMBIEN) 10 MG tablet 10 mg. As needed       No current facility-administered medications for this visit.    SURGICAL HISTORY:  Past  Surgical History  Procedure Laterality Date  . Wrist surgery  1990  . Breast biopsy Left 1960    REVIEW OF SYSTEMS:  Pertinent items are noted in HPI.   HEALTH MAINTENANCE:  PHYSICAL EXAMINATION: Blood pressure 130/68, pulse 77, temperature 97.8 F (36.6 C), temperature source Oral, resp. rate 18, height 5\' 4"  (1.626 m), weight 142 lb 3.2 oz (64.501 kg). Body mass index is 24.4 kg/(m^2). ECOG PERFORMANCE STATUS: 1 - Symptomatic but completely ambulatory   General appearance: alert, cooperative and appears stated age Lymph nodes: Cervical, supraclavicular, and axillary nodes normal. Resp: clear to auscultation bilaterally Back: symmetric, no curvature. ROM normal. No CVA tenderness. Cardio: regular rate and rhythm, S1, S2 normal, no murmur, click, rub or gallop GI: soft, non-tender; bowel sounds normal; no masses,  no organomegaly Extremities: extremities normal, atraumatic, no cyanosis or edema Neurologic: Grossly normal Left breast: No masses nipple discharge or skin changes Right breast: I am unable to palpate the mass in the right breast that was palpable previously. There is no nipple discharge inversion no skin changes  LABORATORY DATA: Lab Results  Component Value Date   WBC 5.8 09/16/2013   HGB 14.4 09/16/2013   HCT 43.3 09/16/2013   MCV 87.8 09/16/2013   PLT 238 09/16/2013      Chemistry      Component Value Date/Time   NA 141 06/10/2013 1246   K 3.9 06/10/2013 1246   CL 104 06/10/2013 1246   CO2 27 06/10/2013 1246   BUN 15.0 06/10/2013 1246   CREATININE 0.9 06/10/2013 1246      Component Value Date/Time   CALCIUM 10.0 06/10/2013 1246   ALKPHOS 56 06/10/2013 1246   AST 14 06/10/2013 1246   ALT 12 06/10/2013 1246   BILITOT 0.71 06/10/2013 1246       RADIOGRAPHIC STUDIES:  No results found.  ASSESSMENT: 79 year old female with  #1 stage II invasive mammary carcinoma of the right breast measuring 3.8 cm in greatest diameter mature by MRI. Tumor was ER positive PR  positive HER-2/neu negative with a proliferation marker Ki-67 of 30%. Patient was subsequently begun on letrozole 2.5 mg neoadjuvant bleeding. She is tolerating it well. She is now seen in the interim to evaluate response. I am not able to appreciate the mass in her right breast. She also thinks that it is no longer there. However we will do MRI in about 2 months time and then refer her to Dr. Jamey Ripa for possible consideration of lumpectomy if indeed there has been a considerable response and she is able to have a lumpectomy and  PLAN:   #1 proceed with MRI of the breasts with and without contrast.  #2 continue letrozole 2.5 mg daily.  #3 set her up to be seen by Dr. Jamey Ripa in November after the MRI and I will  see her back as well   All questions were answered. The patient knows to call the clinic with any problems, questions or concerns. We can certainly see the patient much sooner if necessary.  I spent 20 minutes counseling the patient face to face. The total time spent in the appointment was 25 minutes.    Drue Second, MD Medical/Oncology [facility] [phone] (beeper) [phone] (Office)  09/16/2013, 10:41 AM

## 2013-09-18 ENCOUNTER — Other Ambulatory Visit: Payer: Self-pay | Admitting: Emergency Medicine

## 2013-09-18 ENCOUNTER — Telehealth: Payer: Self-pay | Admitting: Oncology

## 2013-09-18 NOTE — Telephone Encounter (Signed)
Received a call back from [facility] stating she is no longer scheduling breast mri appt. Per [facility] in now responsible for scheduling breast mri appts and the contact person is [place] @ [phone]. At this point pt is already scheduled for mri @ [facility] and although date requested for mri was for November per appt note from GI pt requested appt be scheduled for 10/13. Desk nurse made aware. Pt has all other appts.

## 2013-09-27 DIAGNOSIS — Z23 Encounter for immunization: Secondary | ICD-10-CM | POA: Diagnosis not present

## 2013-09-30 ENCOUNTER — Ambulatory Visit
Admission: RE | Admit: 2013-09-30 | Discharge: 2013-09-30 | Disposition: A | Discharge status: Home / Self Care | Payer: Medicare Other | Source: Ambulatory Visit | Attending: Oncology | Admitting: Oncology

## 2013-09-30 DIAGNOSIS — C50212 Malignant neoplasm of upper-inner quadrant of left female breast: Secondary | ICD-10-CM

## 2013-09-30 MED ORDER — GADOBENATE DIMEGLUMINE 529 MG/ML IV SOLN
13.0000 mL | Freq: Once | INTRAVENOUS | Status: AC | PRN
  Administered 2013-09-30: 13 mL via INTRAVENOUS

## 2013-10-11 ENCOUNTER — Telehealth: Payer: Self-pay | Admitting: *Deleted

## 2013-10-11 NOTE — Telephone Encounter (Signed)
Per MD, notified pt lump has decreased in size. Reminded pt of upcoming appt on 11/18.

## 2013-10-11 NOTE — Telephone Encounter (Signed)
Pt called requesting MRI results from 09/30/13. Next f/u 11/18. Will review with MD

## 2013-10-11 NOTE — Telephone Encounter (Signed)
Let her know the lump has decreased in size

## 2013-10-29 ENCOUNTER — Ambulatory Visit (INDEPENDENT_AMBULATORY_CARE_PROVIDER_SITE_OTHER): Payer: Medicare Other | Admitting: Surgery

## 2013-10-29 ENCOUNTER — Encounter (INDEPENDENT_AMBULATORY_CARE_PROVIDER_SITE_OTHER): Payer: Self-pay | Admitting: Surgery

## 2013-10-29 VITALS — BP 122/64 | HR 80 | Resp 18 | Ht 64.0 in | Wt 144.0 lb

## 2013-10-29 DIAGNOSIS — C50911 Malignant neoplasm of unspecified site of right female breast: Secondary | ICD-10-CM

## 2013-10-29 DIAGNOSIS — N6099 Unspecified benign mammary dysplasia of unspecified breast: Secondary | ICD-10-CM

## 2013-10-29 DIAGNOSIS — N62 Hypertrophy of breast: Secondary | ICD-10-CM

## 2013-10-29 DIAGNOSIS — C50919 Malignant neoplasm of unspecified site of unspecified female breast: Secondary | ICD-10-CM | POA: Diagnosis not present

## 2013-10-29 NOTE — Patient Instructions (Signed)
We will schedule surgery to remove the cancer from your right breast and also the abnormality found in your left breast on the original MRI scan

## 2013-10-29 NOTE — Progress Notes (Signed)
NAME: [name] French       DOB: 09/10/1926           DATE: 10/29/2013       MRN:[id number]  CC:  Chief Complaint  Patient presents with  . Breast Cancer Long Term Follow Up    HPI: she comes in for followup. She was diagnosed about 6 months ago with a breast cancer in the upper quadrant of the right breast and a second area of atypical hyperplasia in the left breast. She's been undergoing neoadjuvant hormonal therapy. She tolerated that well. Her recent MRI shows significant decrease in the size of the tumors. She likely had scheduled surgery.she has had no medical issues over the last 6 months.  EXAM: Vital signs: BP 122/64  Pulse 80  Resp 18  Ht 5' 4" (1.626 m)  Wt 144 lb (65.318 kg)  BMI 24.71 kg/m2  General: Patient alert, oriented, NAD Heart: Regular no murmurs rubs or gallops Lungs: Clear to auscultation, normal respirations Breasts: There is a subtle mass in the right breast upper-inner quadrant which I think represents the tumor. It is much less noticeable that was initially. There is no palpable mass on the left. Lymphatics: There is no axillary adenopathy on either side Abdomen: Soft, benign  Data reviewed: I looked over the notes from the medical oncologist and the MRI that was recently done IMP: 1. Right breast cancer upper inner quadrant, status post neoadjuvant hormonal therapy 2. Area of atypical hyperplasia left breast  PLAN: I think we can then do a successful lumpectomy on the right breast cancer. She also needs a excisional biopsy of the area of atypical hyperplasia on the left which will require a guidewire localization. Since there were 2 areas on the right breast there were adjacent to one to be sure we get both of them so we will also have wire localization placed on the right as well. I have discussed the indications for the lumpectomy and described the procedure. She understand that the chance of removal of the abnormal area is very good, but that  occasionally we are unable to locate it and may have to do a second procedure. We also discussed the possibility of a second procedure to get additional tissue. Risks of surgery such as bleeding and infection have also been explained, as well as the implications of not doing the surgery. She understands and wishes to proceed.   Ashar Lewinski J 10/29/2013   

## 2013-10-29 NOTE — Addendum Note (Signed)
Addended byLiliana Cline on: 10/29/2013 03:00 PM   Modules accepted: Orders

## 2013-11-04 ENCOUNTER — Encounter (HOSPITAL_BASED_OUTPATIENT_CLINIC_OR_DEPARTMENT_OTHER): Payer: Self-pay | Admitting: *Deleted

## 2013-11-04 NOTE — Progress Notes (Signed)
No labs needed

## 2013-11-05 ENCOUNTER — Telehealth: Payer: Self-pay | Admitting: *Deleted

## 2013-11-05 ENCOUNTER — Ambulatory Visit (HOSPITAL_BASED_OUTPATIENT_CLINIC_OR_DEPARTMENT_OTHER): Payer: Medicare Other | Admitting: Oncology

## 2013-11-05 ENCOUNTER — Encounter: Payer: Self-pay | Admitting: Oncology

## 2013-11-05 VITALS — BP 123/67 | HR 90 | Temp 97.7°F | Resp 20 | Ht 64.0 in | Wt 142.4 lb

## 2013-11-05 DIAGNOSIS — C50212 Malignant neoplasm of upper-inner quadrant of left female breast: Secondary | ICD-10-CM

## 2013-11-05 DIAGNOSIS — C50219 Malignant neoplasm of upper-inner quadrant of unspecified female breast: Secondary | ICD-10-CM | POA: Diagnosis not present

## 2013-11-05 DIAGNOSIS — Z170 Estrogen receptor positive status [ER+]: Secondary | ICD-10-CM

## 2013-11-05 NOTE — Progress Notes (Signed)
OFFICE PROGRESS NOTE  CC  Gaspar Garbe, MD [address] [facility], [place]. [place] [address] Dr. Cyndia Bent  DIAGNOSIS: 79 year old with new diagnosis of right breast cancer,   STAGE:  Right breast  3.5 cm, ER positive PR positive HER-2/neu negative  T2 NX MX  Upper inner quadrant of the right breast middle to posterior thirds at the 1:00 position 8 cm from the right nipple   PRIOR THERAPY: #1In May 2014 patient felt a mass in her right upper-inner quadrant. Because of this she had a diagnostic mammogram and an ultrasound performed. The mammogram showed a spiculated irregular mass measuring 2.5 cm in the upper inner quadrant of the right breast corresponding to the palpable finding. Ultrasound showed hypoechoic irregular spiculated mass in the right breast 1:00 location 8 cm from the nipple measuring 2.0 x 1.8 x 1.2 cm.she was recommended a biopsy. She had this done on 05/08/2013. The pathology showed a grade 1-2 invasive mammary carcinoma. Tumor was ER +100% PR +93% proliferation marker Ki-67 30% and HER-2/neu negative. She went on to have MRIs of the breasts performed the MRI in the right breast in the upper inner quadrant middle to posterior third showed irregular mass measuring 2.3 x 2.5 x 2.8 cm. A second smaller but similar appearing irregular enhancing mass was seen at the anterior to the inferior margin of the biopsied mass. The 2 masses were contiguous separated by only 2-3 mm. The second measured 1.2 x 1.1 x 1.0. In total both measured 3.8 cm in the AP diameter. In the left breast there was a linear area of enhancement in a ductal distribution in the upper inner quadrant of the right breast measuring 1.5 cm. 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 This is in a radial  scar.  #2 patient was interested in breast conservation and therefore she was begun on neoadjuvant antiestrogen therapy with letrozole 2.5 mg daily.   CURRENT THERAPY: Letrozole 2.5 mg daily since 06/10/2013 (patient will hold this for the surgery)  INTERVAL HISTORY: [name] French 79 y.o. female returns for followup visitOverall she's doing well. She's tolerating the letrozole without any significant problems. She denies any headaches double vision blurring of vision no difficulty in swallowing she has not experienced any is hot flashes no night sweats no myalgias and arthralgias. She has not noticed any nausea vomiting fevers no abdominal pain no diarrhea or constipation no skin rashes. Patient had MRI of the breasts performed and there was significant reduction in the tumor size. We discussed the results today. Remainder of the 10 point review of systems is negative.  MEDICAL HISTORY: Past Medical History  Diagnosis Date  . Hemorrhoids   . Arthritis   . Allergic rhinitis   . Rosacea   . Diverticulosis   . Hx of adenomatous colonic polyps 05/2002  . 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 Osteoporosis     ALLERGIES:  is allergic to codeine and neosporin.  MEDICATIONS:  Current Outpatient Prescriptions  Medication Sig Dispense Refill  . aspirin 81 MG tablet Take 81 mg by mouth daily.      . Calcium Carbonate (CALTRATE 600 PO) Take by mouth daily.        . Cholecalciferol (VITAMIN D PO) Take 1 tablet by mouth 2 (two) times daily.        Marland Kitchen  DiphenhydrAMINE HCl, Sleep, (UNISOM SLEEPGELS PO) Take by mouth at bedtime. One at bedtime       . GLUCOSAMINE-CHONDROITIN PO Take 1 tablet by mouth daily.       Marland Kitchen letrozole (FEMARA) 2.5 MG tablet Take 1 tablet (2.5 mg total) by mouth daily.  90 tablet  6  . Loperamide HCl (IMODIUM PO) Take 1 tablet by mouth as needed.        . pravastatin (PRAVACHOL) 40 MG tablet       . Probiotic Product (ALIGN PO) Take 1 tablet by mouth daily.         Marland Kitchen zolpidem (AMBIEN) 10 MG tablet 10 mg. As needed       No current facility-administered medications for this visit.    SURGICAL HISTORY:  Past Surgical History  Procedure Laterality Date  . Wrist surgery  1990    lt  . Breast biopsy Left 1960    lt br bx  . Eye surgery      both cataracts  . Colonoscopy      REVIEW OF SYSTEMS:  Pertinent items are noted in HPI.   HEALTH MAINTENANCE:  PHYSICAL EXAMINATION: Blood pressure 123/67, pulse 90, temperature 97.7 F (36.5 C), temperature source Oral, resp. rate 20, height 5\' 4"  (1.626 m), weight 142 lb 6.4 oz (64.592 kg). Body mass index is 24.43 kg/(m^2). ECOG PERFORMANCE STATUS: 1 - Symptomatic but completely ambulatory   General appearance: alert, cooperative and appears stated age Lymph nodes: Cervical, supraclavicular, and axillary nodes normal. Resp: clear to auscultation bilaterally Back: symmetric, no curvature. ROM normal. No CVA tenderness. Cardio: regular rate and rhythm, S1, S2 normal, no murmur, click, rub or gallop GI: soft, non-tender; bowel sounds normal; no masses,  no organomegaly Extremities: extremities normal, atraumatic, no cyanosis or edema Neurologic: Grossly normal Left breast: No masses nipple discharge or skin changes Right breast: I am unable to palpate the mass in the right breast that was palpable previously. There is no nipple discharge inversion no skin changes  LABORATORY DATA: Lab Results  Component Value Date   WBC 5.8 09/16/2013   HGB 14.4 09/16/2013   HCT 43.3 09/16/2013   MCV 87.8 09/16/2013   PLT 238 09/16/2013      Chemistry      Component Value Date/Time   NA 142 09/16/2013 1012   K 4.1 09/16/2013 1012   CL 104 06/10/2013 1246   CO2 27 09/16/2013 1012   BUN 15.3 09/16/2013 1012   CREATININE 0.9 09/16/2013 1012      Component Value Date/Time   CALCIUM 10.1 09/16/2013 1012   ALKPHOS 57 09/16/2013 1012   AST 13 09/16/2013 1012   ALT 12 09/16/2013 1012   BILITOT 0.67 09/16/2013 1012        RADIOGRAPHIC STUDIES:  No results found.  ASSESSMENT: 79 year old female with  #1 stage II invasive mammary carcinoma of the right breast measuring 3.8 cm in greatest diameter mature by MRI. Tumor was ER positive PR positive HER-2/neu negative with a proliferation marker Ki-67 of 30%. Patient was subsequently begun on letrozole 2.5 mg neoadjuvant bleeding.   #2 patient had MRI of the breasts performed which revealed significant reduction in the tumor size in the right breast. She also was seen by Dr. Jamey Ripa. He is planning on taking her to the OR tomorrow.   PLAN:  #1 patient will proceed with lumpectomy that is scheduled tomorrow.  #2 I have recommended that she discontinue the letrozole for now and she will restart  it 1 week after her surgery.  #3 I will see her back on 11/19/2013.   All questions were answered. The patient knows to call the clinic with any problems, questions or concerns. We can certainly see the patient much sooner if necessary.  I spent 20 minutes counseling the patient face to face. The total time spent in the appointment was 25 minutes.    Drue Second, MD Medical/Oncology [facility] [phone] (beeper) [phone] (Office)  11/05/2013, 10:05 AM

## 2013-11-05 NOTE — Telephone Encounter (Signed)
appts made and printed...td 

## 2013-11-06 ENCOUNTER — Encounter (HOSPITAL_BASED_OUTPATIENT_CLINIC_OR_DEPARTMENT_OTHER): Payer: Self-pay | Admitting: *Deleted

## 2013-11-06 ENCOUNTER — Ambulatory Visit
Admission: RE | Admit: 2013-11-06 | Discharge: 2013-11-06 | Disposition: A | Discharge status: Home / Self Care | Payer: Medicare Other | Source: Ambulatory Visit | Attending: Surgery | Admitting: Surgery

## 2013-11-06 ENCOUNTER — Encounter (HOSPITAL_BASED_OUTPATIENT_CLINIC_OR_DEPARTMENT_OTHER)
Admission: RE | Disposition: A | Discharge status: Home / Self Care | Payer: Self-pay | Source: Ambulatory Visit | Attending: Surgery

## 2013-11-06 ENCOUNTER — Ambulatory Visit (HOSPITAL_BASED_OUTPATIENT_CLINIC_OR_DEPARTMENT_OTHER)
Admission: RE | Admit: 2013-11-06 | Discharge: 2013-11-06 | Disposition: A | Discharge status: Home / Self Care | Payer: Medicare Other | Source: Ambulatory Visit | Attending: Surgery | Admitting: Surgery

## 2013-11-06 ENCOUNTER — Encounter (HOSPITAL_BASED_OUTPATIENT_CLINIC_OR_DEPARTMENT_OTHER): Payer: Medicare Other | Admitting: Anesthesiology

## 2013-11-06 ENCOUNTER — Ambulatory Visit (HOSPITAL_BASED_OUTPATIENT_CLINIC_OR_DEPARTMENT_OTHER): Payer: Medicare Other | Admitting: Anesthesiology

## 2013-11-06 DIAGNOSIS — Z87891 Personal history of nicotine dependence: Secondary | ICD-10-CM | POA: Diagnosis not present

## 2013-11-06 DIAGNOSIS — C50212 Malignant neoplasm of upper-inner quadrant of left female breast: Secondary | ICD-10-CM

## 2013-11-06 DIAGNOSIS — N6089 Other benign mammary dysplasias of unspecified breast: Secondary | ICD-10-CM | POA: Diagnosis not present

## 2013-11-06 DIAGNOSIS — Z853 Personal history of malignant neoplasm of breast: Secondary | ICD-10-CM

## 2013-11-06 DIAGNOSIS — D0590 Unspecified type of carcinoma in situ of unspecified breast: Secondary | ICD-10-CM | POA: Insufficient documentation

## 2013-11-06 DIAGNOSIS — C50919 Malignant neoplasm of unspecified site of unspecified female breast: Secondary | ICD-10-CM | POA: Diagnosis not present

## 2013-11-06 DIAGNOSIS — C50911 Malignant neoplasm of unspecified site of right female breast: Secondary | ICD-10-CM

## 2013-11-06 DIAGNOSIS — C50219 Malignant neoplasm of upper-inner quadrant of unspecified female breast: Secondary | ICD-10-CM | POA: Diagnosis not present

## 2013-11-06 DIAGNOSIS — R920 Mammographic microcalcification found on diagnostic imaging of breast: Secondary | ICD-10-CM

## 2013-11-06 HISTORY — PX: BREAST LUMPECTOMY WITH NEEDLE LOCALIZATION: SHX5759

## 2013-11-06 LAB — POCT HEMOGLOBIN-HEMACUE: Hemoglobin: 13.2 g/dL (ref 12.0–15.0)

## 2013-11-06 SURGERY — BREAST LUMPECTOMY WITH NEEDLE LOCALIZATION
Anesthesia: General | Site: Breast | Laterality: Bilateral | Wound class: Clean

## 2013-11-06 MED ORDER — BUPIVACAINE HCL (PF) 0.25 % IJ SOLN
INTRAMUSCULAR | Status: AC
  Filled 2013-11-06: qty 30

## 2013-11-06 MED ORDER — HYDROCODONE-ACETAMINOPHEN 5-325 MG PO TABS: 1.0000 | ORAL_TABLET | ORAL | Status: DC | PRN

## 2013-11-06 MED ORDER — 0.9 % SODIUM CHLORIDE (POUR BTL) OPTIME
TOPICAL | Status: DC | PRN
  Administered 2013-11-06: 1000 mL

## 2013-11-06 MED ORDER — CHLORHEXIDINE GLUCONATE 4 % EX LIQD: 1.0000 "application " | Freq: Once | CUTANEOUS | Status: DC

## 2013-11-06 MED ORDER — EPHEDRINE SULFATE 50 MG/ML IJ SOLN
INTRAMUSCULAR | Status: DC | PRN
  Administered 2013-11-06: 10 mg via INTRAVENOUS

## 2013-11-06 MED ORDER — PROPOFOL 10 MG/ML IV BOLUS
INTRAVENOUS | Status: DC | PRN
  Administered 2013-11-06: 100 mg via INTRAVENOUS

## 2013-11-06 MED ORDER — DEXAMETHASONE SODIUM PHOSPHATE 4 MG/ML IJ SOLN
INTRAMUSCULAR | Status: DC | PRN
  Administered 2013-11-06: 5 mg via INTRAVENOUS

## 2013-11-06 MED ORDER — FENTANYL CITRATE 0.05 MG/ML IJ SOLN
INTRAMUSCULAR | Status: AC
  Filled 2013-11-06: qty 2

## 2013-11-06 MED ORDER — BUPIVACAINE HCL (PF) 0.25 % IJ SOLN
INTRAMUSCULAR | Status: DC | PRN
  Administered 2013-11-06: 30 mL

## 2013-11-06 MED ORDER — LIDOCAINE HCL (PF) 1 % IJ SOLN
INTRAMUSCULAR | Status: AC
  Filled 2013-11-06: qty 30

## 2013-11-06 MED ORDER — FENTANYL CITRATE 0.05 MG/ML IJ SOLN
25.0000 ug | INTRAMUSCULAR | Status: DC | PRN
  Administered 2013-11-06 (×2): 25 ug via INTRAVENOUS

## 2013-11-06 MED ORDER — FENTANYL CITRATE 0.05 MG/ML IJ SOLN: 50.0000 ug | INTRAMUSCULAR | Status: DC | PRN

## 2013-11-06 MED ORDER — PROPOFOL 10 MG/ML IV EMUL
INTRAVENOUS | Status: AC
  Filled 2013-11-06: qty 50

## 2013-11-06 MED ORDER — LIDOCAINE HCL (CARDIAC) 20 MG/ML IV SOLN
INTRAVENOUS | Status: DC | PRN
  Administered 2013-11-06: 60 mg via INTRAVENOUS

## 2013-11-06 MED ORDER — CEFAZOLIN SODIUM-DEXTROSE 2-3 GM-% IV SOLR
2.0000 g | INTRAVENOUS | Status: AC
  Administered 2013-11-06: 2 g via INTRAVENOUS

## 2013-11-06 MED ORDER — FENTANYL CITRATE 0.05 MG/ML IJ SOLN
INTRAMUSCULAR | Status: DC | PRN
  Administered 2013-11-06: 50 ug via INTRAVENOUS
  Administered 2013-11-06: 25 ug via INTRAVENOUS
  Administered 2013-11-06: 50 ug via INTRAVENOUS

## 2013-11-06 MED ORDER — LACTATED RINGERS IV SOLN
INTRAVENOUS | Status: DC
  Administered 2013-11-06 (×2): via INTRAVENOUS

## 2013-11-06 MED ORDER — ONDANSETRON HCL 4 MG/2ML IJ SOLN
INTRAMUSCULAR | Status: DC | PRN
  Administered 2013-11-06: 4 mg via INTRAVENOUS

## 2013-11-06 MED ORDER — CEFAZOLIN SODIUM 1-5 GM-% IV SOLN
INTRAVENOUS | Status: AC
  Filled 2013-11-06: qty 100

## 2013-11-06 MED ORDER — MIDAZOLAM HCL 2 MG/2ML IJ SOLN: 1.0000 mg | INTRAMUSCULAR | Status: DC | PRN

## 2013-11-06 SURGICAL SUPPLY — 49 items
ADH SKN CLS APL DERMABOND .7 (GAUZE/BANDAGES/DRESSINGS) ×2
APPLICATOR COTTON TIP 6IN STRL (MISCELLANEOUS) IMPLANT
BINDER BREAST LRG (GAUZE/BANDAGES/DRESSINGS) ×1 IMPLANT
BINDER BREAST MEDIUM (GAUZE/BANDAGES/DRESSINGS) IMPLANT
BINDER BREAST XLRG (GAUZE/BANDAGES/DRESSINGS) IMPLANT
BINDER BREAST XXLRG (GAUZE/BANDAGES/DRESSINGS) IMPLANT
BLADE HEX COATED 2.75 (ELECTRODE) ×2 IMPLANT
BLADE SURG 15 STRL LF DISP TIS (BLADE) ×1 IMPLANT
BLADE SURG 15 STRL SS (BLADE) ×4
CANISTER SUCT 1200ML W/VALVE (MISCELLANEOUS) ×2 IMPLANT
CHLORAPREP W/TINT 26ML (MISCELLANEOUS) ×2 IMPLANT
CLIP TI MEDIUM 6 (CLIP) IMPLANT
CLIP TI WIDE RED SMALL 6 (CLIP) ×2 IMPLANT
COVER MAYO STAND STRL (DRAPES) ×2 IMPLANT
COVER TABLE BACK 60X90 (DRAPES) ×2 IMPLANT
DECANTER SPIKE VIAL GLASS SM (MISCELLANEOUS) ×1 IMPLANT
DERMABOND ADVANCED (GAUZE/BANDAGES/DRESSINGS) ×2
DERMABOND ADVANCED .7 DNX12 (GAUZE/BANDAGES/DRESSINGS) ×1 IMPLANT
DEVICE DUBIN W/COMP PLATE 8390 (MISCELLANEOUS) ×2 IMPLANT
DRAPE LAPAROTOMY TRNSV 102X78 (DRAPE) ×2 IMPLANT
DRAPE SURG 17X23 STRL (DRAPES) IMPLANT
DRAPE UTILITY XL STRL (DRAPES) ×2 IMPLANT
ELECT REM PT RETURN 9FT ADLT (ELECTROSURGICAL) ×2
ELECTRODE REM PT RTRN 9FT ADLT (ELECTROSURGICAL) ×1 IMPLANT
GLOVE BIOGEL M 7.0 STRL (GLOVE) ×1 IMPLANT
GLOVE BIOGEL PI IND STRL 7.5 (GLOVE) IMPLANT
GLOVE BIOGEL PI INDICATOR 7.5 (GLOVE) ×1
GLOVE EUDERMIC 7 POWDERFREE (GLOVE) ×2 IMPLANT
GLOVE EXAM NITRILE MD LF STRL (GLOVE) ×1 IMPLANT
GOWN PREVENTION PLUS XLARGE (GOWN DISPOSABLE) ×4 IMPLANT
KIT MARKER MARGIN INK (KITS) ×2 IMPLANT
NDL HYPO 25X1 1.5 SAFETY (NEEDLE) ×1 IMPLANT
NEEDLE HYPO 25X1 1.5 SAFETY (NEEDLE) ×2 IMPLANT
NS IRRIG 1000ML POUR BTL (IV SOLUTION) ×1 IMPLANT
PACK BASIN DAY SURGERY FS (CUSTOM PROCEDURE TRAY) ×2 IMPLANT
PENCIL BUTTON HOLSTER BLD 10FT (ELECTRODE) ×2 IMPLANT
SHEET MEDIUM DRAPE 40X70 STRL (DRAPES) ×1 IMPLANT
SLEEVE SCD COMPRESS KNEE MED (MISCELLANEOUS) ×2 IMPLANT
SPONGE GAUZE 4X4 12PLY (GAUZE/BANDAGES/DRESSINGS) IMPLANT
SPONGE INTESTINAL PEANUT (DISPOSABLE) IMPLANT
SPONGE LAP 4X18 X RAY DECT (DISPOSABLE) ×2 IMPLANT
STAPLER VISISTAT 35W (STAPLE) IMPLANT
SUT MNCRL AB 4-0 PS2 18 (SUTURE) ×3 IMPLANT
SUT SILK 0 TIES 10X30 (SUTURE) IMPLANT
SUT VICRYL 3-0 CR8 SH (SUTURE) ×3 IMPLANT
SYR CONTROL 10ML LL (SYRINGE) ×2 IMPLANT
TOWEL OR NON WOVEN STRL DISP B (DISPOSABLE) ×2 IMPLANT
TUBE CONNECTING 20X1/4 (TUBING) ×2 IMPLANT
YANKAUER SUCT BULB TIP NO VENT (SUCTIONS) ×2 IMPLANT

## 2013-11-06 NOTE — Anesthesia Preprocedure Evaluation (Signed)
Anesthesia Evaluation  Patient identified by MRN, date of birth, ID band Patient awake    Reviewed: Allergy & Precautions, H&P , NPO status , Patient's Chart, lab work & pertinent test results  Airway Mallampati: I TM Distance: >3 FB Neck ROM: Full    Dental no notable dental hx. (+) Teeth Intact and Dental Advisory Given   Pulmonary neg pulmonary ROS, former smoker,  breath sounds clear to auscultation  Pulmonary exam normal       Cardiovascular negative cardio ROS  Rhythm:Regular Rate:Normal     Neuro/Psych negative neurological ROS  negative psych ROS   GI/Hepatic negative GI ROS, Neg liver ROS,   Endo/Other  negative endocrine ROS  Renal/GU negative Renal ROS  negative genitourinary   Musculoskeletal   Abdominal   Peds  Hematology negative hematology ROS (+)   Anesthesia Other Findings   Reproductive/Obstetrics negative OB ROS                           Anesthesia Physical Anesthesia Plan  ASA: II  Anesthesia Plan: General   Post-op Pain Management:    Induction: Intravenous  Airway Management Planned: LMA  Additional Equipment:   Intra-op Plan:   Post-operative Plan: Extubation in OR  Informed Consent: I have reviewed the patients History and Physical, chart, labs and discussed the procedure including the risks, benefits and alternatives for the proposed anesthesia with the patient or authorized representative who has indicated his/her understanding and acceptance.   Dental advisory given  Plan Discussed with: CRNA  Anesthesia Plan Comments:         Anesthesia Quick Evaluation

## 2013-11-06 NOTE — H&P (View-Only) (Signed)
NAME: [name] French       DOB: [birth year]           DATE: 10/29/2013       ZOX:[id number]  CC:  Chief Complaint  Patient presents with  . Breast Cancer Long Term Follow Up    HPI: she comes in for followup. She was diagnosed about 6 months ago with a breast cancer in the upper quadrant of the right breast and a second area of atypical hyperplasia in the left breast. She's been undergoing neoadjuvant hormonal therapy. She tolerated that well. Her recent MRI shows significant decrease in the size of the tumors. She likely had scheduled surgery.she has had no medical issues over the last 6 months.  EXAM: Vital signs: BP 122/64  Pulse 80  Resp 18  Ht 5\' 4"  (1.626 m)  Wt 144 lb (65.318 kg)  BMI 24.71 kg/m2  General: Patient alert, oriented, NAD Heart: Regular no murmurs rubs or gallops Lungs: Clear to auscultation, normal respirations Breasts: There is a subtle mass in the right breast upper-inner quadrant which I think represents the tumor. It is much less noticeable that was initially. There is no palpable mass on the left. Lymphatics: There is no axillary adenopathy on either side Abdomen: Soft, benign  Data reviewed: I looked over the notes from the medical oncologist and the MRI that was recently done IMP: 1. Right breast cancer upper inner quadrant, status post neoadjuvant hormonal therapy 2. Area of atypical hyperplasia left breast  PLAN: I think we can then do a successful lumpectomy on the right breast cancer. She also needs a excisional biopsy of the area of atypical hyperplasia on the left which will require a guidewire localization. Since there were 2 areas on the right breast there were adjacent to one to be sure we get both of them so we will also have wire localization placed on the right as well. I have discussed the indications for the lumpectomy and described the procedure. She understand that the chance of removal of the abnormal area is very good, but that  occasionally we are unable to locate it and may have to do a second procedure. We also discussed the possibility of a second procedure to get additional tissue. Risks of surgery such as bleeding and infection have also been explained, as well as the implications of not doing the surgery. She understands and wishes to proceed.   Atleigh Gruen J 10/29/2013

## 2013-11-06 NOTE — Interval H&P Note (Signed)
History and Physical Interval Note:  11/06/2013 11:07 AM  [name] French  has presented today for surgery, with the diagnosis of right breast cancer  left atypical hyperplasia  The various methods of treatment have been discussed with the patient and family. After consideration of risks, benefits and other options for treatment, the patient has consented to  Procedure(s): BREAST LUMPECTOMY WITH NEEDLE LOCALIZATION (Bilateral) as a surgical intervention .  The patient's history has been reviewed, patient examined, no change in status, stable for surgery.  I have reviewed the patient's chart and labs.  Questions were answered to the patient's satisfaction.     [name] J

## 2013-11-06 NOTE — Anesthesia Postprocedure Evaluation (Signed)
  Anesthesia Post-op Note  Patient: [name] French  Procedure(s) Performed: Procedure(s): BREAST LUMPECTOMY WITH NEEDLE LOCALIZATION (Bilateral)  Patient Location: PACU  Anesthesia Type:General  Level of Consciousness: awake and alert   Airway and Oxygen Therapy: Patient Spontanous Breathing  Post-op Pain: mild  Post-op Assessment: Post-op Vital signs reviewed, Patient's Cardiovascular Status Stable and Respiratory Function Stable  Post-op Vital Signs: Reviewed  Filed Vitals:   11/06/13 1345  BP: 128/59  Pulse: 84  Temp:   Resp: 16    Complications: No apparent anesthesia complications

## 2013-11-06 NOTE — Anesthesia Procedure Notes (Signed)
Procedure Name: LMA Insertion Date/Time: 11/06/2013 11:27 AM Performed by: Gar Gibbon Pre-anesthesia Checklist: Patient identified, Emergency Drugs available, Suction available and Patient being monitored Patient Re-evaluated:Patient Re-evaluated prior to inductionOxygen Delivery Method: Circle System Utilized Preoxygenation: Pre-oxygenation with 100% oxygen Intubation Type: IV induction Ventilation: Mask ventilation without difficulty LMA: LMA inserted LMA Size: 4.0 Number of attempts: 1 Airway Equipment and Method: bite block Placement Confirmation: positive ETCO2 Tube secured with: Tape Dental Injury: Teeth and Oropharynx as per pre-operative assessment

## 2013-11-06 NOTE — Transfer of Care (Signed)
Immediate Anesthesia Transfer of Care Note  Patient: [name] French  Procedure(s) Performed: Procedure(s): BREAST LUMPECTOMY WITH NEEDLE LOCALIZATION (Bilateral)  Patient Location: PACU  Anesthesia Type:General  Level of Consciousness: awake, alert  and oriented  Airway & Oxygen Therapy: Patient Spontanous Breathing and Patient connected to face mask oxygen  Post-op Assessment: Report given to PACU RN and Post -op Vital signs reviewed and stable  Post vital signs: Reviewed and stable  Complications: No apparent anesthesia complications

## 2013-11-06 NOTE — Op Note (Addendum)
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 It was somewhat large and neoadjuvant hormonal therapy was begun. At the time of presentation an abnormality was found in the left breast and a needle core biopsy shows an atypical hyperplasia. Excisional biopsy was recommended at the time of definitive surgery for her right breast cancer. Her cancer has shrunk dramatically with the neoadjuvant hormonal therapy and she now presents for a right partial mastectomy and a left excision of area of atypical hyperplasia.    Description of Procedure: the patient is seen in the preoperative area and the plans confirmed and the wire localizing films reviewed. She was taken to the operating room and after satisfactory general anesthesia was obtained both breasts are prepped and draped in a time out was done.  I began on the left side. The guidewire entered medially and tracked in a horizontal direction directly laterally. I made an incision directly over the guidewire tract,I raised superficial skin flaps, divided the breast tissue down to close to the chest wall medial to the guidewire entry point and then did wide excision beyond the tip of the guidewire. A specimen mammogram showed the clip in the area of abnormality in the specimen. Ink was applied the specimen for pathological orientation and the tissue was sent to the pathologist.  I infiltrated  15 cc of plain 0.25% Marcaine for postop pain relief. I irrigated and made sure everything was dry.I put clips to mark the margins of the cavity. I closed in layers with 3-0 Vicryl, 4-0 Monocryl subcuticular and Dermabond.  Attention was turned to the right side. The guidewire entered superiorly and seen to track a little bit laterally and mainly inferior and deep. I made it curvilinear incision directly over the anticipated area of the tumor. I raised a thin skin flap superiorly and manipulated the guidewire into the wound. I then divided the tissue superior to the guidewire entry down to the chest wall and then elevated the breast tissue off the chest wall taking the fascia with the specimen. I found the tip of the guidewire and impacted into the pectoralis muscle. I then divided the lateral tissues, and made a inferior flap and divided the breast tissue inferior to the tip of the guidewire. A specimen mammogram showed the clip to be in the middle of the specimen but somewhat oriented towards the medial end. I took some additional medial margin to include the medial portion of both superior and inferior margins.  I irrigated to make sure everything was dry. I placed another 15 cc of Marcaine to help with postop pain relief. I clipped the margins of the lumpectomy cavity. I closed in layers with 3-0 Vicryl, 4-0 Monocryl subcuticular and Dermabond. The patient tolerated the procedure well. There were no complications. Counts were correct. Blood loss was minimal.  Currie Paris, MD, FACS 11/06/2013 12:41 PM

## 2013-11-07 ENCOUNTER — Encounter (HOSPITAL_BASED_OUTPATIENT_CLINIC_OR_DEPARTMENT_OTHER): Payer: Self-pay | Admitting: Surgery

## 2013-11-08 ENCOUNTER — Telehealth (INDEPENDENT_AMBULATORY_CARE_PROVIDER_SITE_OTHER): Payer: Self-pay | Admitting: *Deleted

## 2013-11-08 NOTE — Telephone Encounter (Signed)
I was calling pt to check on her postoperatively.  She has no complaints, states she is getting around fine, taking it easy and having no pain.  I informed of her post op appt with Dr. Jamey Ripa on 12/12 with an arrival time of 11:50am.  I instructed her to call our office if she has any questions or concerns.  Pt is agreeable with this plan.

## 2013-11-12 ENCOUNTER — Telehealth (INDEPENDENT_AMBULATORY_CARE_PROVIDER_SITE_OTHER): Payer: Self-pay

## 2013-11-12 NOTE — Telephone Encounter (Signed)
Patient made aware that pathology showed known atypical hyperplasia on left breast - no cancer. She is aware that margins were negative on the right. She is also aware that this report has not been reviewed by an MD. When Dr Jamey Ripa reviews he will go over this report in more detail with patient. She is in agreement and expressed appreciation.

## 2013-11-12 NOTE — Telephone Encounter (Signed)
Pt calling requesting path result. Pt states Dr Jamey Ripa advised her someone will call with path result even if he is away. Pt advised msg will be sent to Dr Tenna Child assistant for review and to call or have MD call with result. Pt can be reached at [phone]. Pt states can leave msg on machine.

## 2013-11-15 DIAGNOSIS — B379 Candidiasis, unspecified: Secondary | ICD-10-CM | POA: Diagnosis not present

## 2013-11-19 ENCOUNTER — Ambulatory Visit (HOSPITAL_BASED_OUTPATIENT_CLINIC_OR_DEPARTMENT_OTHER): Payer: Medicare Other | Admitting: Oncology

## 2013-11-19 ENCOUNTER — Telehealth: Payer: Self-pay | Admitting: *Deleted

## 2013-11-19 ENCOUNTER — Encounter: Payer: Self-pay | Admitting: Oncology

## 2013-11-19 VITALS — BP 139/75 | HR 86 | Temp 98.5°F | Resp 18 | Ht 64.0 in | Wt 142.3 lb

## 2013-11-19 DIAGNOSIS — Z170 Estrogen receptor positive status [ER+]: Secondary | ICD-10-CM

## 2013-11-19 DIAGNOSIS — N6320 Unspecified lump in the left breast, unspecified quadrant: Secondary | ICD-10-CM

## 2013-11-19 DIAGNOSIS — C50212 Malignant neoplasm of upper-inner quadrant of left female breast: Secondary | ICD-10-CM

## 2013-11-19 DIAGNOSIS — C50219 Malignant neoplasm of upper-inner quadrant of unspecified female breast: Secondary | ICD-10-CM | POA: Diagnosis not present

## 2013-11-19 NOTE — Telephone Encounter (Signed)
appts made and printed...td 

## 2013-11-19 NOTE — Progress Notes (Signed)
OFFICE PROGRESS NOTE  CC  Gaspar Garbe, MD [address] [facility], [place]. [place] [address] Dr. Cyndia Bent  DIAGNOSIS: 79 year old with new diagnosis of right breast cancer,   STAGE:  Right breast  3.5 cm, ER positive PR positive HER-2/neu negative  T2 NX MX  Upper inner quadrant of the right breast middle to posterior thirds at the 1:00 position 8 cm from the right nipple   PRIOR THERAPY: #1In May 2014 patient felt a mass in her right upper-inner quadrant. Because of this she had a diagnostic mammogram and an ultrasound performed. The mammogram showed a spiculated irregular mass measuring 2.5 cm in the upper inner quadrant of the right breast corresponding to the palpable finding. Ultrasound showed hypoechoic irregular spiculated mass in the right breast 1:00 location 8 cm from the nipple measuring 2.0 x 1.8 x 1.2 cm.she was recommended a biopsy. She had this done on 05/08/2013. The pathology showed a grade 1-2 invasive mammary carcinoma. Tumor was ER +100% PR +93% proliferation marker Ki-67 30% and HER-2/neu negative. She went on to have MRIs of the breasts performed the MRI in the right breast in the upper inner quadrant middle to posterior third showed irregular mass measuring 2.3 x 2.5 x 2.8 cm. A second smaller but similar appearing irregular enhancing mass was seen at the anterior to the inferior margin of the biopsied mass. The 2 masses were contiguous separated by only 2-3 mm. The second measured 1.2 x 1.1 x 1.0. In total both measured 3.8 cm in the AP diameter. In the left breast there was a linear area of enhancement in a ductal distribution in the upper inner quadrant of the right breast measuring 1.5 cm. 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 This is in a radial  scar.  #2 patient was interested in breast conservation and therefore she was begun on neoadjuvant antiestrogen therapy with letrozole 2.5 mg daily. Patient completed 7 months of letrozole.  #3 repeat MRI showed reduction in the right breast this. It  #4 she is status post bilateral lumpectomies. The final pathology revealed:  Diagnosis 1. Breast, lumpectomy, Left - SCLEROSING LESION WITH LOBULAR NEOPLASIA (ATYPICAL LOBULAR HYPERPLASIA), FIBROCYSTIC CHANGES WITH USUAL DUCTAL HYPERPLASIA, AND MICROCALCIFICATIONS. 2. Breast, lumpectomy, Right - INVASIVE GRADE I LOBULAR CARCINOMA SPANNING 2.7 CM IN GREATEST DIMENSION. - LOBULAR CARCINOMA IN SITU PRESE  CURRENT THERAPY: Letrozole 2.5 mg daily   INTERVAL HISTORY: [name] French 79 y.o. female returns for followup visit after her lumpectomies. Overall she tolerated the procedure well. She did develop allergic reaction to hydrocodone. Today she feels well has no complaints of pain. Remainder of the 10 point systems  MEDICAL HISTORY: Past Medical History  Diagnosis Date  . Hemorrhoids   . Arthritis   . Allergic rhinitis   . Rosacea   . Diverticulosis   . Hx of adenomatous colonic polyps 05/2002  . 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 Osteoporosis     ALLERGIES:  is allergic to hydrocodone; codeine; and neosporin.  MEDICATIONS:  Current Outpatient Prescriptions  Medication Sig Dispense Refill  . aspirin 81 MG tablet Take 81 mg by mouth daily.      . Calcium Carbonate (CALTRATE 600 PO) Take by mouth daily.        . Cholecalciferol (VITAMIN D PO) Take 1 tablet  by mouth 2 (two) times daily.        . DiphenhydrAMINE HCl, Sleep, (UNISOM SLEEPGELS PO) Take by mouth at bedtime. One at bedtime       . GLUCOSAMINE-CHONDROITIN PO Take 1 tablet by mouth daily.       Marland Kitchen letrozole (FEMARA) 2.5 MG tablet Take 1 tablet (2.5 mg total) by mouth daily.  90 tablet  6  . Loperamide HCl (IMODIUM PO) Take 1 tablet by mouth as needed.         . pravastatin (PRAVACHOL) 40 MG tablet       . Probiotic Product (ALIGN PO) Take 1 tablet by mouth daily.        Marland Kitchen zolpidem (AMBIEN) 10 MG tablet 10 mg. As needed       No current facility-administered medications for this visit.    SURGICAL HISTORY:  Past Surgical History  Procedure Laterality Date  . Wrist surgery  1990    lt  . Breast biopsy Left 1960    lt br bx  . Eye surgery      both cataracts  . Colonoscopy    . Breast lumpectomy with needle localization Bilateral 11/06/2013    Procedure: BREAST LUMPECTOMY WITH NEEDLE LOCALIZATION;  Surgeon: Currie Paris, MD;  Location: [facility];  Service: General;  Laterality: Bilateral;    REVIEW OF SYSTEMS:  Pertinent items are noted in HPI.   HEALTH MAINTENANCE:  PHYSICAL EXAMINATION: Blood pressure 139/75, pulse 86, temperature 98.5 F (36.9 C), temperature source Oral, resp. rate 18, height 5\' 4"  (1.626 m), weight 142 lb 5 oz (64.553 kg). Body mass index is 24.42 kg/(m^2). ECOG PERFORMANCE STATUS: 1 - Symptomatic but completely ambulatory   General appearance: alert, cooperative and appears stated age Lymph nodes: Cervical, supraclavicular, and axillary nodes normal. Resp: clear to auscultation bilaterally Back: symmetric, no curvature. ROM normal. No CVA tenderness. Cardio: regular rate and rhythm, S1, S2 normal, no murmur, click, rub or gallop GI: soft, non-tender; bowel sounds normal; no masses,  no organomegaly Extremities: extremities normal, atraumatic, no cyanosis or edema Neurologic: Grossly normal Left breast: No masses nipple discharge or skin changes Right breast: I am unable to palpate the mass in the right breast that was palpable previously. There is no nipple discharge inversion no skin changes  LABORATORY DATA: Lab Results  Component Value Date   WBC 5.8 09/16/2013   HGB 13.2 11/06/2013   HCT 43.3 09/16/2013   MCV 87.8 09/16/2013   PLT 238 09/16/2013      Chemistry      Component  Value Date/Time   NA 142 09/16/2013 1012   K 4.1 09/16/2013 1012   CL 104 06/10/2013 1246   CO2 27 09/16/2013 1012   BUN 15.3 09/16/2013 1012   CREATININE 0.9 09/16/2013 1012      Component Value Date/Time   CALCIUM 10.1 09/16/2013 1012   ALKPHOS 57 09/16/2013 1012   AST 13 09/16/2013 1012   ALT 12 09/16/2013 1012   BILITOT 0.67 09/16/2013 1012       RADIOGRAPHIC STUDIES:  No results found.  ASSESSMENT: 79 year old female with  #1 stage II invasive mammary carcinoma of the right breast measuring 3.8 cm in greatest diameter mature by MRI. Tumor was ER positive PR positive HER-2/neu negative with a proliferation marker Ki-67 of 30%. Patient was subsequently begun on letrozole 2.5 mg neoadjuvant bleeding.   #2 patient had MRI of the breasts performed which revealed significant reduction in the tumor size in  the right breast. She also was seen by Dr. Jamey Ripa. He is planning on taking her to the OR tomorrow.  #3 status post bilateral lumpectomies on 11/06/2013. Right breast reveals a 2.7 cm invasive lobular carcinoma left breast atypical lobular hyperplasia. I went over the pathology with the patient in detail. We discussed referring her to radiation. I put a referral to Dr. Lurline Hare.  PLAN:   #1 restart Femara 2.5 mg daily. Total of 5 years of therapy is planned.  #2 refer to radiation oncology to see if there is any role for radiation  #3 I will see patient back in 4 months time.   All questions were answered. The patient knows to call the clinic with any problems, questions or concerns. We can certainly see the patient much sooner if necessary.  I spent 20 minutes counseling the patient face to face. The total time spent in the appointment was 25 minutes.    Drue Second, MD Medical/Oncology [facility] [phone] (beeper) [phone] (Office)  11/19/2013, 3:32 PM

## 2013-11-22 ENCOUNTER — Encounter: Payer: Self-pay | Admitting: Radiation Oncology

## 2013-11-22 NOTE — Progress Notes (Signed)
Location of Breast Cancer: Right Upper Inner Breast  Histology per Pathology Report:  11/06/13 1. Breast, lumpectomy, Left - SCLEROSING LESION WITH LOBULAR NEOPLASIA (ATYPICAL LOBULAR HYPERPLASIA), FIBROCYSTIC CHANGES WITH USUAL DUCTAL HYPERPLASIA, AND MICROCALCIFICATIONS. 2. Breast, lumpectomy, Right - INVASIVE GRADE I LOBULAR CARCINOMA SPANNING 2.7 CM IN GREATEST DIMENSION. - LOBULAR CARCINOMA IN SITU PRESENT. - ASSOCIATED CALCIFICATIONS.  05/27/13 Diagnosis Breast, left, needle core biopsy, upper inner quadrant - COMPLEX SCLEROSING LESION, SEE COMMENT. - LOBULAR NEOPLASIA (ATYPICAL HYPERPLASIA). - MICROCALCIFICATIONS IDENTIFIED.  05/08/13 Diagnosis Breast, right, needle core biopsy, mass, 1 o'clock, 8 cm / nipple - INVASIVE MAMMARY CARCINOMA - SEE COMMENT. Microscopic Comment  Receptor Status: ER(100%), PR (93%), Her2-neu (No amplification), Ki-67(30%)  In May 2014 patient felt a mass in her right upper-inner quadrant. Because of this she had a diagnostic mammogram and an ultrasound performed.  Past/Anticipated interventions by surgeon, if any: Right Breast Lumpectomy and Left Breast Lumpectomy  Past/Anticipated interventions by medical oncology, if any: Chemotherapy:  patient was interested in breast conservation and therefore she was begun on neoadjuvant antiestrogen therapy with letrozole 2.5 mg daily. Patient completed 7 months of letrozole as of 11/19/13   Lymphedema issues, if any:No    Pain issues, if any: No  SAFETY ISSUES:  Prior radiation? No  Pacemaker/ICD? No       Possible current pregnancy? No       Is the patient on methotrexate? No   Current Complaints / other details:Patient here with husband of 10 years.Has 2 living children and 1 deceased.2 pregnancies as had set of twins.Menses at age 65.First full-term birth age 32.Took HRT for years.No family histroy of breast cancer.

## 2013-11-27 ENCOUNTER — Ambulatory Visit
Admission: RE | Admit: 2013-11-27 | Discharge: 2013-11-27 | Disposition: A | Discharge status: Home / Self Care | Payer: Medicare Other | Source: Ambulatory Visit | Attending: Radiation Oncology | Admitting: Radiation Oncology

## 2013-11-27 ENCOUNTER — Encounter: Payer: Self-pay | Admitting: Radiation Oncology

## 2013-11-27 VITALS — BP 129/58 | HR 92 | Temp 97.6°F | Wt 142.0 lb

## 2013-11-27 DIAGNOSIS — C50212 Malignant neoplasm of upper-inner quadrant of left female breast: Secondary | ICD-10-CM

## 2013-11-27 DIAGNOSIS — Z170 Estrogen receptor positive status [ER+]: Secondary | ICD-10-CM | POA: Insufficient documentation

## 2013-11-27 DIAGNOSIS — C50919 Malignant neoplasm of unspecified site of unspecified female breast: Secondary | ICD-10-CM | POA: Insufficient documentation

## 2013-11-27 DIAGNOSIS — C439 Malignant melanoma of skin, unspecified: Secondary | ICD-10-CM | POA: Diagnosis not present

## 2013-11-27 DIAGNOSIS — C50219 Malignant neoplasm of upper-inner quadrant of unspecified female breast: Secondary | ICD-10-CM | POA: Diagnosis not present

## 2013-11-27 HISTORY — DX: Long term (current) use of aromatase inhibitors: Z79.811

## 2013-11-27 HISTORY — DX: Malignant neoplasm of unspecified site of unspecified female breast: C50.919

## 2013-11-27 NOTE — Progress Notes (Signed)
Please see the Nurse Progress Note in the MD Initial Consult Encounter for this patient. 

## 2013-11-27 NOTE — Progress Notes (Signed)
Radiation Oncology         [phone]) [phone] ________________________________  Initial outpatient Consultation - Date: 11/27/2013   Name: [name] French MRN: [id number]   DOB: [birth year]  REFERRING PHYSICIAN: Victorino December, MD  DIAGNOSIS:  1. Breast cancer of upper-inner quadrant of left female breast     HISTORY OF PRESENT ILLNESS::[name] French is a 79 y.o. female  who palpated a right breast mass after having a negative mammogram several months before.. She had a history of fibrocystic disease so she at first ignored it. Within the mass did not resolve she presented to her physician and had a diagnostic mammogram and ultrasound performed. This was in may of this year. A mammogram showed a spiculated mass measuring 2.5 cm in the upper inner quadrant of the right breast. Ultrasound confirmed this finding with an area measuring 2.0 x 1.8 x 1.2 cm. A biopsy on 05/08/2013 showed a grade 1-2 invasive mammary carcinoma. This was ER positive PR positive with a Ki-67 of 30%. HER-2 was negative. Bilateral breast MRI showed a mass measuring 2.3 x 2.5 x 2.8 cm in the upper inner quadrant. A second mass was noted measuring 1.2 x 1.1 x 1.0 cm just inferior to this mass. In total both masses measuring 3.8 cm. In the left breast an area of linear enhancement was noted in the upper inner quadrant measuring 1.5 cm. A biopsy showed a complex sclerosing lesion with lobular neoplasia as well as microcalcifications in a radial scar. She was interested in breast conservation which was not possible to time given the size of this mass. She underwent 7 months of neoadjuvant Femara. It she had her lumpectomy is performed on November 19. The left lumpectomy again showed atypical lobular hyperplasia and a sclerosing lesion with fibrocystic change usual ductal hyperplasia microcalcifications. The right lumpectomy showed an invasive grade 1 lobular carcinoma measuring 2.7 cm with associated LCIS and calcifications. Margins  were negative with the greatest being 3 mm at the medial margin. No lymphovascular invasion was identified. Treatment effect was not commented on. No lymph nodes were evaluated. She still has some soreness from her surgery. She was ready to go back to her water aerobics but has been cautioned not to do so for 6 weeks after surgery. She sees Dr. Jamey Ripa on Friday. She is accompanied by her good friend today. She has elected to go back on antiestrogen therapy with Dr. Welton Flakes. She states she tolerated this very well in the neoadjuvant setting and had really minimal arthralgias.  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    PAST SURGICAL HISTORY: Past Surgical History  Procedure Laterality Date  . Wrist surgery  1990    lt  . Breast biopsy Left 1960    lt br bx/benign  . Eye surgery      both cataracts  . Colonoscopy    . Breast lumpectomy with needle localization Bilateral 11/06/2013    Procedure: BREAST LUMPECTOMY WITH NEEDLE LOCALIZATION;  Surgeon: Currie Paris, MD;  Location: [facility];  Service: General;  Laterality: Bilateral;  . Mohs surgery Right     nose basal/squamous    FAMILY HISTORY:  Family History  Problem Relation Age of Onset  . Prostate cancer Father   . Lung cancer Sister   . Heart  disease Mother     SOCIAL HISTORY:  History  Substance Use Topics  . Smoking status: Former Smoker -- 0.50 packs/day for 25 years    Types: Cigarettes    Quit date: 12/19/1978  . Smokeless tobacco: Never Used  . Alcohol Use: Yes     Comment: rare- wine 2-3 times weekly    ALLERGIES: Hydrocodone; Codeine; and Neosporin  MEDICATIONS:  Current Outpatient Prescriptions  Medication Sig Dispense Refill  .  aspirin 81 MG tablet Take 81 mg by mouth daily.      . Calcium Carbonate (CALTRATE 600 PO) Take by mouth daily.        . Cholecalciferol (VITAMIN D PO) Take 1 tablet by mouth 2 (two) times daily.        . DiphenhydrAMINE HCl, Sleep, (UNISOM SLEEPGELS PO) Take by mouth at bedtime. One at bedtime       . GLUCOSAMINE-CHONDROITIN PO Take 1 tablet by mouth daily.       Marland Kitchen letrozole (FEMARA) 2.5 MG tablet Take 1 tablet (2.5 mg total) by mouth daily.  90 tablet  6  . Loperamide HCl (IMODIUM PO) Take 1 tablet by mouth as needed.        . pravastatin (PRAVACHOL) 40 MG tablet       . Probiotic Product (ALIGN PO) Take 1 tablet by mouth daily.        Marland Kitchen zolpidem (AMBIEN) 10 MG tablet 10 mg. As needed       No current facility-administered medications for this encounter.    REVIEW OF SYSTEMS:  A 15 point review of systems is documented in the electronic medical record. This was obtained by the nursing staff. However, I reviewed this with the patient to discuss relevant findings and make appropriate changes.  Pertinent items are noted in HPI.  PHYSICAL EXAM:  Filed Vitals:   11/27/13 1358  BP: 129/58  Pulse: 92  Temp: 97.6 F (36.4 C)  .142 lb (64.411 kg). Pleasant female in no distress sitting comfortably on the examining room table. She appears younger than her stated age. She has bilateral healing lumpectomy incisions. She has no palpable adenopathy. She is alert and oriented x3. She is 5 out of 5 strength bilaterally. She appears younger than her stated age.  LABORATORY DATA:  Lab Results  Component Value Date   WBC 5.8 09/16/2013   HGB 13.2 11/06/2013   HCT 43.3 09/16/2013   MCV 87.8 09/16/2013   PLT 238 09/16/2013   Lab Results  Component Value Date   NA 142 09/16/2013   K 4.1 09/16/2013   CL 104 06/10/2013   CO2 27 09/16/2013   Lab Results  Component Value Date   ALT 12 09/16/2013   AST 13 09/16/2013   ALKPHOS 57 09/16/2013   BILITOT 0.67 09/16/2013     RADIOGRAPHY: Mm Lt Plc Breast Loc  Dev   1st Lesion  Inc Mammo Guide  11/06/2013   CLINICAL DATA:  Biopsy proven right upper inner quadrant invasive ductal carcinoma and left upper inner quadrant atypical ductal hyperplasia. The patient presents for preoperative localization prior to right lumpectomy and left excision.  EXAM: NEEDLE LOCALIZATION OF THE right and left BREAST WITH MAMMO GUIDANCE  COMPARISON:  Previous exams.  FINDINGS: Patient presents for needle localization prior to right lumpectomy and left excision. I met with the patient and we discussed the procedure of needle localization including benefits and alternatives. We discussed the high likelihood of a successful procedure. We discussed the risks of  the procedure, including infection, bleeding, tissue injury, and further surgery. Informed, written consent was given. The usual time-out protocol was performed immediately prior to the procedure.  Using mammographic guidance, sterile technique, 2% lidocaine and a 7 cm modified Kopans needle, the right upper inner quadrant mass and wing shaped clip or localized using a superior to inferior approach.  Using similar technique, attention was then turned to the left breast. A 5 cm modified Kopan's needle was placed targeting the dumbbell shaped clip in the left upper inner quadrant using a medial collateral approach.  The films were marked for Dr. Jamey Ripa.  Specimen radiograph was performed at day surgery and confirms the intact hook wires, clips, and right breast mass are present in the tissue samples. The specimen was marked for pathology.  IMPRESSION: Needle localization right and left breast. No apparent complications.   Electronically Signed   By: Christiana Pellant M.D.   On: 11/06/2013 14:55   Mm Rt Plc Breast Loc Dev   1st Lesion  Inc Mammo Guide  11/06/2013   CLINICAL DATA:  Biopsy proven right upper inner quadrant invasive ductal carcinoma and left upper inner quadrant atypical ductal hyperplasia. The patient presents for  preoperative localization prior to right lumpectomy and left excision.  EXAM: NEEDLE LOCALIZATION OF THE right and left BREAST WITH MAMMO GUIDANCE  COMPARISON:  Previous exams.  FINDINGS: Patient presents for needle localization prior to right lumpectomy and left excision. I met with the patient and we discussed the procedure of needle localization including benefits and alternatives. We discussed the high likelihood of a successful procedure. We discussed the risks of the procedure, including infection, bleeding, tissue injury, and further surgery. Informed, written consent was given. The usual time-out protocol was performed immediately prior to the procedure.  Using mammographic guidance, sterile technique, 2% lidocaine and a 7 cm modified Kopans needle, the right upper inner quadrant mass and wing shaped clip or localized using a superior to inferior approach.  Using similar technique, attention was then turned to the left breast. A 5 cm modified Kopan's needle was placed targeting the dumbbell shaped clip in the left upper inner quadrant using a medial collateral approach.  The films were marked for Dr. Jamey Ripa.  Specimen radiograph was performed at day surgery and confirms the intact hook wires, clips, and right breast mass are present in the tissue samples. The specimen was marked for pathology.  IMPRESSION: Needle localization right and left breast. No apparent complications.   Electronically Signed   By: Christiana Pellant M.D.   On: 11/06/2013 14:55      IMPRESSION: T2 N0 grade 1 invasive lobular carcinoma of the right breast  PLAN: I spoke with Ms. Gerdeman today regarding her diagnosis and options for treatment. We discussed the role of radiation in decreasing local failures in patients who undergo breast conservation. We discussed the results of randomized trials showing equivalency in terms of survival between mastectomy and lumpectomy. We discussed the results of randomized trials looking elderly  patients who undergo lumpectomy and antiestrogen therapy versus lumpectomy, radiation, and antiestrogen therapy. We discussed the low rate of local failures in either arm of these trials and lack of survival benefit with the addition of radiation on top of antiestrogen therapy. It is notable that none of the patient's included in these trials underwent neoadjuvant chemotherapy and only about 15-20% of patients had tumor size over 2 cm. In addition the majority of patients neither had a sentinel lymph node biopsy or axillary node dissection  with only about 15-20% of patients only having their nodes only clinically evaluated. He discussed the process of simulation the placement tattoos. We discussed we discussed the low likelihood of significant side effects as well as the low likelihood of secondary malignancies. She has tolerated antiestrogen therapy well and has agreed to close followup with Dr. Welton Flakes and surgery. She therefore has decided not to pursue radiation. I think she has a good handle of the issues involved. I will see her back on an as-needed basis. I spent 40 minutes  face to face with the patient and more than 50% of that time was spent in counseling and/or coordination of care.   ------------------------------------------------  Lurline Hare, MD

## 2013-11-29 ENCOUNTER — Ambulatory Visit (INDEPENDENT_AMBULATORY_CARE_PROVIDER_SITE_OTHER): Payer: Medicare Other | Admitting: Surgery

## 2013-11-29 ENCOUNTER — Encounter (INDEPENDENT_AMBULATORY_CARE_PROVIDER_SITE_OTHER): Payer: Self-pay | Admitting: Surgery

## 2013-11-29 VITALS — BP 118/62 | HR 80 | Resp 18 | Ht 65.0 in | Wt 142.0 lb

## 2013-11-29 DIAGNOSIS — C50919 Malignant neoplasm of unspecified site of unspecified female breast: Secondary | ICD-10-CM

## 2013-11-29 DIAGNOSIS — C50911 Malignant neoplasm of unspecified site of right female breast: Secondary | ICD-10-CM

## 2013-11-29 DIAGNOSIS — Z09 Encounter for follow-up examination after completed treatment for conditions other than malignant neoplasm: Secondary | ICD-10-CM

## 2013-11-29 NOTE — Progress Notes (Signed)
/  NAME: [name] French                                            DOB: [birth year] DATE: 11/29/2013                                                   MRN: [id number]  CC:  Chief Complaint  Patient presents with  . Routine Post Op    lumpectomy    HPI: This patient comes in for post op follow-up .Sheunderwent Bilateral lumpectomies on 11/. She feels that she is doing well.  PE:  VITAL SIGNS: BP 118/62  Pulse 80  Resp 18  Ht 5\' 5"  (1.651 m)  Wt 142 lb (64.411 kg)  BMI 23.63 kg/m2  General: The patient appears to be healthy, NAD Both incisions are healing nicely. No evidence of any problems.  DATA REVIEWED: Path: 2. CHROMOGENIC IN-SITU HYBRIDIZATION Results: HER-2/NEU BY CISH - NO AMPLIFICATION OF HER-2 DETECTED. RESULT RATIO OF HER2: CEP 17 SIGNALS 0.97 AVERAGE HER2 COPY NUMBER PER CELL 1.70 REFERENCE RANGE NEGATIVE HER2/Chr17 Ratio <2.0 and Average HER2 copy number <4.0 EQUIVOCAL HER2/Chr17 Ratio <2.0 and Average HER2 copy number 4.0 and <6.0 POSITIVE HER2/Chr17 Ratio >=2.0 and/or Average HER2 copy number >=6.0 Pecola Leisure MD Pathologist, Electronic Signature ( Signed 11/13/2013) FINAL DIAGNOSIS Diagnosis 1. Breast, lumpectomy, Left - SCLEROSING LESION WITH LOBULAR NEOPLASIA (ATYPICAL LOBULAR HYPERPLASIA), FIBROCYSTIC CHANGES WITH USUAL DUCTAL HYPERPLASIA, AND MICROCALCIFICATIONS. 2. Breast, lumpectomy, Right - INVASIVE GRADE I LOBULAR CARCINOMA SPANNING 2.7 CM IN GREATEST DIMENSION. - LOBULAR CARCINOMA IN SITU PRESENT. - ASSOCIATED CALCIFICATIONS.  IMPRESSION: The patient is doing well S/P Left excisional biopsy for atypical lobular hyperplasia and right lumpectomy for low-grade receptor positive stage II invasive lobular carcinoma.    PLAN: She'll come back for a followup visit in about 4 months. She's been seen by the radiation oncologist and will not  do radiation therapy. She is beginning her anti-estrogen therapy

## 2013-11-29 NOTE — Patient Instructions (Signed)
See us again in 4 months

## 2013-12-06 NOTE — Addendum Note (Signed)
Encounter addended by: Sheleen Conchas Mintz Tecumseh Yeagley, RN on: 12/06/2013  3:18 PM<BR>     Documentation filed: Charges VN

## 2014-03-11 DIAGNOSIS — M949 Disorder of cartilage, unspecified: Secondary | ICD-10-CM | POA: Diagnosis not present

## 2014-03-11 DIAGNOSIS — R82998 Other abnormal findings in urine: Secondary | ICD-10-CM | POA: Diagnosis not present

## 2014-03-11 DIAGNOSIS — N183 Chronic kidney disease, stage 3 unspecified: Secondary | ICD-10-CM | POA: Diagnosis not present

## 2014-03-11 DIAGNOSIS — M899 Disorder of bone, unspecified: Secondary | ICD-10-CM | POA: Diagnosis not present

## 2014-03-11 DIAGNOSIS — E785 Hyperlipidemia, unspecified: Secondary | ICD-10-CM | POA: Diagnosis not present

## 2014-03-18 DIAGNOSIS — M899 Disorder of bone, unspecified: Secondary | ICD-10-CM | POA: Diagnosis not present

## 2014-03-18 DIAGNOSIS — H269 Unspecified cataract: Secondary | ICD-10-CM | POA: Diagnosis not present

## 2014-03-18 DIAGNOSIS — M949 Disorder of cartilage, unspecified: Secondary | ICD-10-CM | POA: Diagnosis not present

## 2014-03-18 DIAGNOSIS — E785 Hyperlipidemia, unspecified: Secondary | ICD-10-CM | POA: Diagnosis not present

## 2014-03-18 DIAGNOSIS — Z79899 Other long term (current) drug therapy: Secondary | ICD-10-CM | POA: Diagnosis not present

## 2014-03-18 DIAGNOSIS — Z0000 Encounter for general adult medical examination without abnormal findings: Secondary | ICD-10-CM | POA: Diagnosis not present

## 2014-03-18 DIAGNOSIS — C50919 Malignant neoplasm of unspecified site of unspecified female breast: Secondary | ICD-10-CM | POA: Diagnosis not present

## 2014-03-18 DIAGNOSIS — IMO0002 Reserved for concepts with insufficient information to code with codable children: Secondary | ICD-10-CM | POA: Diagnosis not present

## 2014-03-18 DIAGNOSIS — G4700 Insomnia, unspecified: Secondary | ICD-10-CM | POA: Diagnosis not present

## 2014-03-18 DIAGNOSIS — K5289 Other specified noninfective gastroenteritis and colitis: Secondary | ICD-10-CM | POA: Diagnosis not present

## 2014-03-18 DIAGNOSIS — Z23 Encounter for immunization: Secondary | ICD-10-CM | POA: Diagnosis not present

## 2014-03-20 DIAGNOSIS — Z1212 Encounter for screening for malignant neoplasm of rectum: Secondary | ICD-10-CM | POA: Diagnosis not present

## 2014-03-31 ENCOUNTER — Encounter (INDEPENDENT_AMBULATORY_CARE_PROVIDER_SITE_OTHER): Payer: Self-pay

## 2014-03-31 ENCOUNTER — Ambulatory Visit (INDEPENDENT_AMBULATORY_CARE_PROVIDER_SITE_OTHER): Payer: Medicare Other | Admitting: Surgery

## 2014-03-31 ENCOUNTER — Encounter (INDEPENDENT_AMBULATORY_CARE_PROVIDER_SITE_OTHER): Payer: Self-pay | Admitting: Surgery

## 2014-03-31 VITALS — BP 118/76 | HR 77 | Temp 97.1°F | Ht 65.0 in | Wt 138.8 lb

## 2014-03-31 DIAGNOSIS — Z853 Personal history of malignant neoplasm of breast: Secondary | ICD-10-CM | POA: Diagnosis not present

## 2014-03-31 NOTE — Progress Notes (Signed)
Subjective:     Patient ID: [name] French, female   DOB: [birth year], 80 y.o.   MRN: [id number]  HPI This is a patient of Dr. Dickie La that I am taking over. She is status post bilateral lumpectomies in December of 2014. The right was for atypical nodular hyperplasia and the left was for invasive lobular carcinoma which was grade 1 stage I She has no complaints  Review of Systems     Objective:   Physical Exam On exam, both her incisions are well healed in both the breasts. There is no evidence of infection    Assessment:     Patient stable with a history of left breast cancer     Plan:     She will continue her antiestrogen therapy. I will see her back in 6 months

## 2014-04-02 ENCOUNTER — Telehealth: Payer: Self-pay | Admitting: Oncology

## 2014-04-02 ENCOUNTER — Encounter: Payer: Self-pay | Admitting: Oncology

## 2014-04-02 ENCOUNTER — Ambulatory Visit (HOSPITAL_BASED_OUTPATIENT_CLINIC_OR_DEPARTMENT_OTHER): Payer: Medicare Other | Admitting: Oncology

## 2014-04-02 ENCOUNTER — Other Ambulatory Visit (HOSPITAL_BASED_OUTPATIENT_CLINIC_OR_DEPARTMENT_OTHER): Payer: Medicare Other

## 2014-04-02 VITALS — BP 128/74 | HR 83 | Temp 98.1°F | Resp 18 | Ht 65.0 in | Wt 140.3 lb

## 2014-04-02 DIAGNOSIS — M899 Disorder of bone, unspecified: Secondary | ICD-10-CM | POA: Diagnosis not present

## 2014-04-02 DIAGNOSIS — M949 Disorder of cartilage, unspecified: Secondary | ICD-10-CM

## 2014-04-02 DIAGNOSIS — E559 Vitamin D deficiency, unspecified: Secondary | ICD-10-CM

## 2014-04-02 DIAGNOSIS — C50219 Malignant neoplasm of upper-inner quadrant of unspecified female breast: Secondary | ICD-10-CM

## 2014-04-02 DIAGNOSIS — Z170 Estrogen receptor positive status [ER+]: Secondary | ICD-10-CM

## 2014-04-02 DIAGNOSIS — C50212 Malignant neoplasm of upper-inner quadrant of left female breast: Secondary | ICD-10-CM

## 2014-04-02 LAB — CBC WITH DIFFERENTIAL/PLATELET
BASO%: 2.5 % — ABNORMAL HIGH (ref 0.0–2.0)
Basophils Absolute: 0.2 10*3/uL — ABNORMAL HIGH (ref 0.0–0.1)
EOS ABS: 0.3 10*3/uL (ref 0.0–0.5)
EOS%: 4.9 % (ref 0.0–7.0)
HEMATOCRIT: 44.3 % (ref 34.8–46.6)
HEMOGLOBIN: 14.6 g/dL (ref 11.6–15.9)
LYMPH#: 2.2 10*3/uL (ref 0.9–3.3)
LYMPH%: 32.7 % (ref 14.0–49.7)
MCH: 29 pg (ref 25.1–34.0)
MCHC: 32.9 g/dL (ref 31.5–36.0)
MCV: 88.1 fL (ref 79.5–101.0)
MONO#: 0.5 10*3/uL (ref 0.1–0.9)
MONO%: 7.6 % (ref 0.0–14.0)
NEUT#: 3.5 10*3/uL (ref 1.5–6.5)
NEUT%: 52.3 % (ref 38.4–76.8)
Platelets: 262 10*3/uL (ref 145–400)
RBC: 5.03 10*6/uL (ref 3.70–5.45)
RDW: 14 % (ref 11.2–14.5)
WBC: 6.7 10*3/uL (ref 3.9–10.3)

## 2014-04-02 LAB — COMPREHENSIVE METABOLIC PANEL (CC13)
ALBUMIN: 4 g/dL (ref 3.5–5.0)
ALT: 8 U/L (ref 0–55)
ANION GAP: 7 meq/L (ref 3–11)
AST: 12 U/L (ref 5–34)
Alkaline Phosphatase: 59 U/L (ref 40–150)
BUN: 16.9 mg/dL (ref 7.0–26.0)
CHLORIDE: 109 meq/L (ref 98–109)
CO2: 27 mEq/L (ref 22–29)
Calcium: 9.8 mg/dL (ref 8.4–10.4)
Creatinine: 0.8 mg/dL (ref 0.6–1.1)
GLUCOSE: 116 mg/dL (ref 70–140)
POTASSIUM: 4.1 meq/L (ref 3.5–5.1)
Sodium: 144 mEq/L (ref 136–145)
Total Bilirubin: 0.37 mg/dL (ref 0.20–1.20)
Total Protein: 7.1 g/dL (ref 6.4–8.3)

## 2014-04-02 NOTE — Patient Instructions (Signed)
Continue letrozole 2.5 mg daily  Increase vitamin D to 2000 units daily  We will see you back in 6 months

## 2014-04-02 NOTE — Progress Notes (Signed)
Pr provided copy of labs dtd March 17, 2014 done by [facility].  Sent to Scan.

## 2014-04-02 NOTE — Progress Notes (Signed)
[facility] OFFICE PROGRESS NOTE  Patient Care Team: [name] Pao, MD as PCP - General (Internal Medicine) [name] Robinson, MD as Consulting Physician (Oncology) Dr. Coralie French  DIAGNOSIS: 80 year old with  diagnosis of right breast cancers   STAGE:  Right breast  2.7 cm, ER positive PR positive HER-2/neu negative  T2 NX MX  Upper inner quadrant of the right breast middle to posterior thirds at the 1:00 position 8 cm from the right nipple   SUMMARY OF ONCOLOGIC HISTORY: #1 04/18/2013: Mass felt by patient and right upper inner quadrant. Workup revealed spiculated irregular mass measuring 2.7 cm on mammogram in the right breast. Ultrasound revealed the mass to be 2.0 x 1.8 cm. Biopsy on 05/08/2013 showed a grade 1-2 invasive mammary carcinoma ER positive PR positive HER-2/neu negative with a proliferation marker Ki-67 30%. MRI of the breasts showed the mass in the right upper inner quadrant measuring 2.3 x 2.5 x 2.8 cm. There was also a second smaller but similar appearing mass anteriorly. Together these masses measured about 3.8 cm. In the left breast there was a linear area of enhancement measuring 1.5 cm. She had biopsy of this performed that showed is complex sclerosing lesion with lobular neoplasia/atypical hyperplasia. Patient was interested in breast conservation so therefore she underwent neoadjuvant antiestrogen therapy with letrozole 2.5 mg daily. She completed a total of 7 months.  #2 patient subsequently had bilateral lumpectomies performed and the final pathology on the left revealed sclerosing lesion with lobular neoplasia (atypical lobular hyperplasia) fibrocystic changes and usual ductal hyperplasia with microcalcifications. On the right patient had a grade 1 invasive lobular carcinoma measuring 2.7 cm with lobular carcinoma in situ.  #3 postoperatively she was seen by Dr. Thea French. Discussion regarding adjuvant radiation therapy took place and  eventually patient declined radiation. She subsequently continued to letrozole 2.5 mg adjuvantly. Total of 5 years of therapy of the letrozole is planned.  CURRENT THERAPY: Letrozole 2.5 mg daily    INTERVAL HISTORY: [name] French 80 y.o. female returns for followup visit today. She was last seen by me in December 2014. Since then she has been on letrozole and she's tolerating it well. She apparently also had a bone density scan performed and she was noted to have some staining on the density scan. She is now back on Fosamax 70 mg weekly. She is tolerating this well. She does tell me that she's been on Fosamax in the past as well and has done well with it. Patient is also on vitamin D 1000 units. Her last level was 37 in the low normal side. I have recommended that she increase the vitamin D2 2000 units on a daily basis and continue calcium as well. She denies any aches pains fevers chills night sweats. She continues to be very active. Remainder of the 10 point review of systems is negative and as below  Past Surgical History  Procedure Laterality Date  . Wrist surgery  1990    lt  . Breast biopsy Left 1960    lt br bx/benign  . Eye surgery      both cataracts  . Colonoscopy    . Breast lumpectomy with needle localization Bilateral 11/06/2013    Procedure: BREAST LUMPECTOMY WITH NEEDLE LOCALIZATION;  Surgeon: [name] Lasso, MD;  Location: [facility];  Service: General;  Laterality: Bilateral;  . Mohs surgery Right     nose basal/squamous   Past Medical History  Diagnosis Date  .  Hemorrhoids   . Arthritis   . Allergic rhinitis   . Rosacea   . Diverticulosis   . Hx of adenomatous colonic polyps 05/2002  . 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 Osteoporosis   . Use of letrozole (Femara)     neoadjuvant antiestrogen therapy with letrozole 2.5 mg daily x 7 monhts  . Breast cancer 05/08/13    right upper inner, invasive mammary  . Breast cancer, right  breast 04/16/2013    Underwent lumpectomy on 11/06/13. Path showed G1 ILC, 2.7 cm, neg margins, receptor+, Her2neg    History   Social History  . Marital Status: Divorced    Spouse Name: N/A    Number of Children: 2  . Years of Education: N/A   Occupational History  . Retired    Social History Main Topics  . Smoking status: Former Smoker -- 0.50 packs/day for 25 years    Types: Cigarettes    Quit date: 12/19/1978  . Smokeless tobacco: Never Used  . Alcohol Use: Yes     Comment: rare- wine 2-3 times weekly  . Drug Use: No  . Sexual Activity: Not Currently     Comment: menarche at age14, menopause in 88, HRT x 89, age at first live birth 14's, G40 P3   Other Topics Concern  . Not on file   Social History Narrative  . No narrative on file     ALLERGIES:  is allergic to hydrocodone; codeine; and neosporin.  MEDICATIONS:  Current Outpatient Prescriptions  Medication Sig Dispense Refill  . alendronate (FOSAMAX) 70 MG tablet       . aspirin 81 MG tablet Take 81 mg by mouth daily.      . Cholecalciferol (VITAMIN D PO) Take 1 tablet by mouth 2 (two) times daily.        . DiphenhydrAMINE HCl, Sleep, (UNISOM SLEEPGELS PO) Take by mouth at bedtime. One at bedtime       . GLUCOSAMINE-CHONDROITIN PO Take 1 tablet by mouth daily.       Marland Kitchen letrozole (FEMARA) 2.5 MG tablet Take 1 tablet (2.5 mg total) by mouth daily.  90 tablet  6  . Loperamide HCl (IMODIUM PO) Take 1 tablet by mouth as needed.        . pravastatin (PRAVACHOL) 40 MG tablet       . Probiotic Product (ALIGN PO) Take 1 tablet by mouth daily.        Marland Kitchen zolpidem (AMBIEN) 10 MG tablet 10 mg. As needed       No current facility-administered medications for this visit.    REVIEW OF SYSTEMS:   Constitutional: Denies fevers, chills or abnormal weight loss Eyes: Denies blurriness of vision Ears, nose, mouth, throat, and face: Denies mucositis or sore throat Respiratory: Denies cough, dyspnea or wheezes Cardiovascular: Denies  palpitation, chest discomfort or lower extremity swelling Gastrointestinal:  Denies nausea, heartburn or change in bowel habits Skin: Denies abnormal skin rashes Lymphatics: Denies new lymphadenopathy or easy bruising Neurological:Denies numbness, tingling or new weaknesses Behavioral/Psych: Mood is stable, no new changes  All other systems were reviewed with the patient and are negative.  PHYSICAL EXAMINATION: ECOG PERFORMANCE STATUS: 1 - Symptomatic but completely ambulatory  Filed Vitals:   04/02/14 1004  BP: 128/74  Pulse: 83  Temp: 98.1 F (36.7 C)  Resp: 18   Filed Weights   04/02/14 1004  Weight: 140 lb 4.8 oz (63.64 kg)    GENERAL:alert, no distress and comfortable SKIN: skin  color, texture, turgor are normal, no rashes or significant lesions EYES: normal, Conjunctiva are pink and non-injected, sclera clear OROPHARYNX:no exudate, no erythema and lips, buccal mucosa, and tongue normal  NECK: supple, thyroid normal size, non-tender, without nodularity LYMPH:  no palpable lymphadenopathy in the cervical, axillary or inguinal LUNGS: clear to auscultation and percussion with normal breathing effort HEART: regular rate & rhythm and no murmurs and no lower extremity edema ABDOMEN:abdomen soft, non-tender and normal bowel sounds Musculoskeletal:no cyanosis of digits and no clubbing  NEURO: alert & oriented x 3 with fluent speech, no focal motor/sensory deficits Breasts: right breast normal without mass, skin or nipple changes or axillary nodes with well-healed lumpectomy scar no evidence of local recurrence, left breast normal without mass, skin or nipple changes or axillary nodes with well-healed lumpectomy scar no evidence of local recurrence.  LABORATORY DATA:  I have reviewed the data as listed    Component Value Date/Time   NA 142 09/16/2013 1012   K 4.1 09/16/2013 1012   CL 104 06/10/2013 1246   CO2 27 09/16/2013 1012   GLUCOSE 99 09/16/2013 1012   GLUCOSE 95 06/10/2013  1246   BUN 15.3 09/16/2013 1012   CREATININE 0.9 09/16/2013 1012   CALCIUM 10.1 09/16/2013 1012   PROT 7.2 09/16/2013 1012   ALBUMIN 3.8 09/16/2013 1012   AST 13 09/16/2013 1012   ALT 12 09/16/2013 1012   ALKPHOS 57 09/16/2013 1012   BILITOT 0.67 09/16/2013 1012    No results found for this basename: SPEP, UPEP,  kappa and lambda light chains    Lab Results  Component Value Date   WBC 6.7 04/02/2014   NEUTROABS 3.5 04/02/2014   HGB 14.6 04/02/2014   HCT 44.3 04/02/2014   MCV 88.1 04/02/2014   PLT 262 04/02/2014      Chemistry      Component Value Date/Time   NA 142 09/16/2013 1012   K 4.1 09/16/2013 1012   CL 104 06/10/2013 1246   CO2 27 09/16/2013 1012   BUN 15.3 09/16/2013 1012   CREATININE 0.9 09/16/2013 1012      Component Value Date/Time   CALCIUM 10.1 09/16/2013 1012   ALKPHOS 57 09/16/2013 1012   AST 13 09/16/2013 1012   ALT 12 09/16/2013 1012   BILITOT 0.67 09/16/2013 1012       RADIOGRAPHIC STUDIES: I have personally reviewed the radiological images as listed and agreed with the findings in the report. No results found.    ASSESSMENT & PLAN:   80 year old female with  #1 diagnosis of stage II (T2 Nx) invasive lobular carcinoma of the right breast status post neoadjuvant antiestrogen therapy initially for 7 months followed by bilateral lumpectomies. On the right patient had a 2.7 cm residual disease that was grade 1. On the left she only had atypical lobular hyperplasia and sclerosing lesion with lobular neoplasia. Postoperatively patient has done well. She did not receive adjuvant radiation therapy. She however has been continued on adjuvant antiestrogen therapy with letrozole 2.5 mg daily. She continues to tolerate this well without any significant side effects.  #2 bone density: Patient did have some bone loss. She is now back on Fosamax 70 mg q. monthly. This is managed by her primary care physician. She is also taking calcium and vitamin D. I have requested that she increase  the vitamin D 2 2000 units since her vitamin D level was only 37. I would like it to be in the higher range. We discussed the rationale  for this. We also discussed the rationale for continuation of Fosamax. We discussed side effects of Fosamax as well.  #3 health maintenance: Patient will be due for another mammogram sometime in October. She is also recommended to continue exercising be healthy. With healthy eating. I recommended maintaining good BMI.  #4 followup: Patient will be seen for in 6 months time.  No orders of the defined types were placed in this encounter.   All questions were answered. The patient knows to call the clinic with any problems, questions or concerns. No barriers to learning was detected. I spent 20 minutes counseling the patient face to face. The total time spent in the appointment was 30 minutes and more than 50% was on counseling and review of test results     [name] Robinson, MD 04/02/2014 10:22 AM

## 2014-04-02 NOTE — Telephone Encounter (Signed)
, °

## 2014-05-15 DIAGNOSIS — H04129 Dry eye syndrome of unspecified lacrimal gland: Secondary | ICD-10-CM | POA: Diagnosis not present

## 2014-05-15 DIAGNOSIS — Z961 Presence of intraocular lens: Secondary | ICD-10-CM | POA: Diagnosis not present

## 2014-05-15 DIAGNOSIS — H01009 Unspecified blepharitis unspecified eye, unspecified eyelid: Secondary | ICD-10-CM | POA: Diagnosis not present

## 2014-05-15 DIAGNOSIS — H16109 Unspecified superficial keratitis, unspecified eye: Secondary | ICD-10-CM | POA: Diagnosis not present

## 2014-07-07 ENCOUNTER — Telehealth: Payer: Self-pay | Admitting: *Deleted

## 2014-07-07 NOTE — Telephone Encounter (Signed)
Patient called in to report that she has been having some dizziness and only takes Femara in which she can contribute this to. She could not find her drug sheet to see if this was a side effect of this med. She had a trip planned to [place], so she did not take this medicine while she was gone. This is not a typical side effect of Aromatase inhibitors. Patient encouraged to restart Femara and see if the symptoms return. If so, she is to stop and call is to be seen sooner to discuss other treatment options.  Patient was also encouraged to see her primary MD for the complaints of dizziness, associated at times with standing, feeling like she is going to faint. She does also have intermittent issues with sinus drainage. We look forward to seeing patient sooner if needed. She understands to call us with return of systems. Will print and mail patient drug sheet to patient.

## 2014-08-21 ENCOUNTER — Other Ambulatory Visit: Payer: Self-pay | Admitting: Oncology

## 2014-08-21 DIAGNOSIS — C50919 Malignant neoplasm of unspecified site of unspecified female breast: Secondary | ICD-10-CM

## 2014-09-13 ENCOUNTER — Telehealth: Payer: Self-pay | Admitting: Adult Health

## 2014-09-22 ENCOUNTER — Encounter: Payer: Self-pay | Admitting: Gastroenterology

## 2014-09-23 DIAGNOSIS — Z23 Encounter for immunization: Secondary | ICD-10-CM | POA: Diagnosis not present

## 2014-10-01 ENCOUNTER — Other Ambulatory Visit: Payer: Medicare Other

## 2014-10-01 ENCOUNTER — Ambulatory Visit: Payer: Medicare Other | Admitting: Oncology

## 2014-10-08 ENCOUNTER — Ambulatory Visit (HOSPITAL_BASED_OUTPATIENT_CLINIC_OR_DEPARTMENT_OTHER): Payer: Medicare Other | Admitting: Adult Health

## 2014-10-08 ENCOUNTER — Other Ambulatory Visit (HOSPITAL_BASED_OUTPATIENT_CLINIC_OR_DEPARTMENT_OTHER): Payer: Medicare Other

## 2014-10-08 ENCOUNTER — Encounter: Payer: Self-pay | Admitting: Adult Health

## 2014-10-08 ENCOUNTER — Telehealth: Payer: Self-pay | Admitting: Adult Health

## 2014-10-08 VITALS — BP 122/64 | HR 75 | Temp 98.4°F | Resp 18 | Ht 65.0 in | Wt 139.8 lb

## 2014-10-08 DIAGNOSIS — E559 Vitamin D deficiency, unspecified: Secondary | ICD-10-CM

## 2014-10-08 DIAGNOSIS — C50911 Malignant neoplasm of unspecified site of right female breast: Secondary | ICD-10-CM

## 2014-10-08 DIAGNOSIS — C50211 Malignant neoplasm of upper-inner quadrant of right female breast: Secondary | ICD-10-CM

## 2014-10-08 DIAGNOSIS — Z853 Personal history of malignant neoplasm of breast: Secondary | ICD-10-CM

## 2014-10-08 DIAGNOSIS — Z170 Estrogen receptor positive status [ER+]: Secondary | ICD-10-CM

## 2014-10-08 LAB — COMPREHENSIVE METABOLIC PANEL (CC13)
ALBUMIN: 3.9 g/dL (ref 3.5–5.0)
ALT: 14 U/L (ref 0–55)
AST: 13 U/L (ref 5–34)
Alkaline Phosphatase: 51 U/L (ref 40–150)
Anion Gap: 8 mEq/L (ref 3–11)
BUN: 16 mg/dL (ref 7.0–26.0)
CALCIUM: 10.2 mg/dL (ref 8.4–10.4)
CHLORIDE: 108 meq/L (ref 98–109)
CO2: 28 mEq/L (ref 22–29)
Creatinine: 0.9 mg/dL (ref 0.6–1.1)
Glucose: 98 mg/dl (ref 70–140)
POTASSIUM: 4.6 meq/L (ref 3.5–5.1)
SODIUM: 143 meq/L (ref 136–145)
TOTAL PROTEIN: 6.9 g/dL (ref 6.4–8.3)
Total Bilirubin: 0.63 mg/dL (ref 0.20–1.20)

## 2014-10-08 LAB — CBC WITH DIFFERENTIAL/PLATELET
BASO%: 0.3 % (ref 0.0–2.0)
Basophils Absolute: 0 10*3/uL (ref 0.0–0.1)
EOS%: 3.5 % (ref 0.0–7.0)
Eosinophils Absolute: 0.2 10*3/uL (ref 0.0–0.5)
HCT: 44.7 % (ref 34.8–46.6)
HGB: 14.5 g/dL (ref 11.6–15.9)
LYMPH#: 1.9 10*3/uL (ref 0.9–3.3)
LYMPH%: 35.4 % (ref 14.0–49.7)
MCH: 28.9 pg (ref 25.1–34.0)
MCHC: 32.4 g/dL (ref 31.5–36.0)
MCV: 89.1 fL (ref 79.5–101.0)
MONO#: 0.5 10*3/uL (ref 0.1–0.9)
MONO%: 8.6 % (ref 0.0–14.0)
NEUT%: 52.2 % (ref 38.4–76.8)
NEUTROS ABS: 2.9 10*3/uL (ref 1.5–6.5)
Platelets: 265 10*3/uL (ref 145–400)
RBC: 5.01 10*6/uL (ref 3.70–5.45)
RDW: 14 % (ref 11.2–14.5)
WBC: 5.5 10*3/uL (ref 3.9–10.3)

## 2014-10-08 NOTE — Telephone Encounter (Signed)
per pof to sch pt appt-gave pt copy of sch-sch mamma-

## 2014-10-08 NOTE — Patient Instructions (Addendum)
You are doing well.  You have no sign of recurrence.  Continue taking Letrozole daily.  We will see you back in 6 months.  I recommend healthy diet, exercise (weight bearing and aerobic such as walking), and monthly breast exams.  Please call us if you have any questions or concerns.    Breast Self-Awareness Practicing breast self-awareness may pick up problems early, prevent significant medical complications, and possibly save your life. By practicing breast self-awareness, you can become familiar with how your breasts look and feel and if your breasts are changing. This allows you to notice changes early. It can also offer you some reassurance that your breast health is good. One way to learn what is normal for your breasts and whether your breasts are changing is to do a breast self-exam. If you find a lump or something that was not present in the past, it is best to contact your caregiver right away. Other findings that should be evaluated by your caregiver include nipple discharge, especially if it is bloody; skin changes or reddening; areas where the skin seems to be pulled in (retracted); or new lumps and bumps. Breast pain is seldom associated with cancer (malignancy), but should also be evaluated by a caregiver. HOW TO PERFORM A BREAST SELF-EXAM The best time to examine your breasts is 5-7 days after your menstrual period is over. During menstruation, the breasts are lumpier, and it may be more difficult to pick up changes. If you do not menstruate, have reached menopause, or had your uterus removed (hysterectomy), you should examine your breasts at regular intervals, such as monthly. If you are breastfeeding, examine your breasts after a feeding or after using a breast pump. Breast implants do not decrease the risk for lumps or tumors, so continue to perform breast self-exams as recommended. Talk to your caregiver about how to determine the difference between the implant and breast tissue. Also, talk  about the amount of pressure you should use during the exam. Over time, you will become more familiar with the variations of your breasts and more comfortable with the exam. A breast self-exam requires you to remove all your clothes above the waist. 1. Look at your breasts and nipples. Stand in front of a mirror in a room with good lighting. With your hands on your hips, push your hands firmly downward. Look for a difference in shape, contour, and size from one breast to the other (asymmetry). Asymmetry includes puckers, dips, or bumps. Also, look for skin changes, such as reddened or scaly areas on the breasts. Look for nipple changes, such as discharge, dimpling, repositioning, or redness. 2. Carefully feel your breasts. This is best done either in the shower or tub while using soapy water or when flat on your back. Place the arm (on the side of the breast you are examining) above your head. Use the pads (not the fingertips) of your three middle fingers on your opposite hand to feel your breasts. Start in the underarm area and use  inch (2 cm) overlapping circles to feel your breast. Use 3 different levels of pressure (light, medium, and firm pressure) at each circle before moving to the next circle. The light pressure is needed to feel the tissue closest to the skin. The medium pressure will help to feel breast tissue a little deeper, while the firm pressure is needed to feel the tissue close to the ribs. Continue the overlapping circles, moving downward over the breast until you feel your ribs below  your breast. Then, move one finger-width towards the center of the body. Continue to use the  inch (2 cm) overlapping circles to feel your breast as you move slowly up toward the collar bone (clavicle) near the base of the neck. Continue the up and down exam using all 3 pressures until you reach the middle of the chest. Do this with each breast, carefully feeling for lumps or changes. 3.  Keep a written record  with breast changes or normal findings for each breast. By writing this information down, you do not need to depend only on memory for size, tenderness, or location. Write down where you are in your menstrual cycle, if you are still menstruating. Breast tissue can have some lumps or thick tissue. However, see your caregiver if you find anything that concerns you.  SEEK MEDICAL CARE IF:  You see a change in shape, contour, or size of your breasts or nipples.   You see skin changes, such as reddened or scaly areas on the breasts or nipples.   You have an unusual discharge from your nipples.   You feel a new lump or unusually thick areas.  Document Released: 12/05/2005 Document Revised: 11/21/2012 Document Reviewed: 03/21/2012 [facility] Patient Information 2015 [place], [place]. This information is not intended to replace advice given to you by your health care provider. Make sure you discuss any questions you have with your health care provider.

## 2014-10-08 NOTE — Progress Notes (Signed)
[place] OFFICE PROGRESS NOTE  Patient Care Team: Haywood Pao, MD as PCP - General (Internal Medicine) [name], MD as Consulting Physician (Oncology) Dr. Coralie Keens  DIAGNOSIS: 80 year old with  diagnosis of right breast cancers   STAGE:  Right breast  2.7 cm, ER positive PR positive HER-2/neu negative  T2 NX MX  Upper inner quadrant of the right breast middle to posterior thirds at the 1:00 position 8 cm from the right nipple   SUMMARY OF ONCOLOGIC HISTORY: #1 04/18/2013: Mass felt by patient and right upper inner quadrant. Workup revealed spiculated irregular mass measuring 2.7 cm on mammogram in the right breast. Ultrasound revealed the mass to be 2.0 x 1.8 cm. Biopsy on 05/08/2013 showed a grade 1-2 invasive mammary carcinoma ER positive PR positive HER-2/neu negative with a proliferation marker Ki-67 30%. MRI of the breasts showed the mass in the right upper inner quadrant measuring 2.3 x 2.5 x 2.8 cm. There was also a second smaller but similar appearing mass anteriorly. Together these masses measured about 3.8 cm. In the left breast there was a linear area of enhancement measuring 1.5 cm. She had biopsy of this performed that showed is complex sclerosing lesion with lobular neoplasia/atypical hyperplasia. Patient was interested in breast conservation so therefore she began neoadjuvant antiestrogen therapy with letrozole 2.5 mg daily on 06/10/14 She completed a total of 7 months and stopped a week prior to surgery.  #2 patient subsequently had bilateral lumpectomies performed on 11/06/13 and the final pathology on the left revealed sclerosing lesion with lobular neoplasia (atypical lobular hyperplasia) fibrocystic changes and usual ductal hyperplasia with microcalcifications. On the right patient had a grade 1 invasive lobular carcinoma measuring 2.7 cm with lobular carcinoma in situ.  #3 postoperatively she was seen by Dr. Thea Silversmith. Discussion  regarding adjuvant radiation therapy took place and eventually patient declined radiation. She subsequently restarted letrozole 2.5 mg adjuvantly on 11/19/13. Total of 5 years of therapy of the letrozole is planned.  CURRENT THERAPY: Letrozole 2.5 mg daily    INTERVAL HISTORY: KAFI DOTTER 80 y.o. female returns for followup visit today of her right breast cancer.  She was about to go out of town to [place] and felt dizzy so she stopped taking the Letrozole daily because she didn't want complications during her [place].  She says that she didn't have a doctor to call to get advice from.  She did call when she returned from [place] and was told by a nurse to restart the Letrozole which she did.  She has not experienced any further dizziness.  She does experience vaginal dryness, dry eyes, joint aches, decreased libido (why she is not sexually active now), and slight hair thinning.  She is tolerating these adverse effects though.  She is taking Fosamax weekly for her bone density.  She denies any new breast changes, new pain, or any further concerns.    Past Surgical History  Procedure Laterality Date  . Wrist surgery  1990    lt  . Breast biopsy Left 1960    lt br bx/benign  . Eye surgery      both cataracts  . Colonoscopy    . Breast lumpectomy with needle localization Bilateral 11/06/2013    Procedure: BREAST LUMPECTOMY WITH NEEDLE LOCALIZATION;  Surgeon: Haywood Lasso, MD;  Location: [facility];  Service: General;  Laterality: Bilateral;  . Mohs surgery Right     nose basal/squamous   Past  Medical History  Diagnosis Date  . Hemorrhoids   . Arthritis   . Allergic rhinitis   . Rosacea   . Diverticulosis   . Hx of adenomatous colonic polyps 05/2002  . 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 Osteoporosis   . Use of letrozole (Femara)     neoadjuvant antiestrogen therapy with letrozole 2.5 mg daily x 7 monhts  . Breast cancer 05/08/13    right upper inner,  invasive mammary  . Breast cancer, right breast 04/16/2013    Underwent lumpectomy on 11/06/13. Path showed G1 ILC, 2.7 cm, neg margins, receptor+, Her2neg    History   Social History  . Marital Status: Divorced    Spouse Name: N/A    Number of Children: 2  . Years of Education: N/A   Occupational History  . Retired    Social History Main Topics  . Smoking status: Former Smoker -- 0.50 packs/day for 25 years    Types: Cigarettes    Quit date: 12/19/1978  . Smokeless tobacco: Never Used  . Alcohol Use: Yes     Comment: rare- wine 2-3 times weekly  . Drug Use: No  . Sexual Activity: Not Currently     Comment: menarche at age14, menopause in 46, HRT x 52, age at first live birth 66's, G33 P3   Other Topics Concern  . Not on file   Social History Narrative  . No narrative on file     ALLERGIES:  is allergic to hydrocodone; codeine; and neosporin.  MEDICATIONS:  Current Outpatient Prescriptions  Medication Sig Dispense Refill  . alendronate (FOSAMAX) 70 MG tablet       . aspirin 81 MG tablet Take 81 mg by mouth daily.      . Cholecalciferol (VITAMIN D PO) Take 1 tablet by mouth 2 (two) times daily.       . Cholecalciferol (VITAMIN D-3) 1000 UNITS CAPS Take 1 capsule by mouth daily.      . DiphenhydrAMINE HCl, Sleep, (UNISOM SLEEPGELS PO) Take by mouth at bedtime. One at bedtime       . GLUCOSAMINE-CHONDROITIN PO Take 1 tablet by mouth daily.       Marland Kitchen letrozole (FEMARA) 2.5 MG tablet TAKE 1 TABLET BY MOUTH DAILY  90 tablet  0  . pravastatin (PRAVACHOL) 40 MG tablet       . Probiotic Product (ALIGN PO) Take 1 tablet by mouth daily.        . Loperamide HCl (IMODIUM PO) Take 1 tablet by mouth as needed.        . zolpidem (AMBIEN) 10 MG tablet 10 mg. As needed       No current facility-administered medications for this visit.    REVIEW OF SYSTEMS:   A 10 point review of systems was conducted and is otherwise negative except for what is noted above.    Health  Maintenance  Mammogram:  05/2013 Colonoscopy: 12/08/2011, repeat in 3 years if needed Bone Density Scan: 01/2014, decreased put on Fosamax by PCP Pap Smear: n/a Eye Exam: 2015 Vitamin D Level: pending Lipid Panel: followed by PCP   PHYSICAL EXAMINATION: ECOG PERFORMANCE STATUS: 1 - Symptomatic but completely ambulatory  Filed Vitals:   10/08/14 0931  BP: 122/64  Pulse: 75  Temp: 98.4 F (36.9 C)  Resp: 18   Filed Weights   10/08/14 0931  Weight: 139 lb 12.8 oz (63.413 kg)   GENERAL: Patient is a well appearing female in no acute distress HEENT:  Sclerae anicteric.  Oropharynx clear and moist. No ulcerations or evidence of oropharyngeal candidiasis. Neck is supple.  NODES:  No cervical, supraclavicular, or axillary lymphadenopathy palpated.  BREAST EXAM:  S/p bilateral lumpectomies, no nodularity, no sign of recurrence.  Benign bilateral breast exam. LUNGS:  Clear to auscultation bilaterally.  No wheezes or rhonchi. HEART:  Regular rate and rhythm. No murmur appreciated. ABDOMEN:  Soft, nontender.  Positive, normoactive bowel sounds. No organomegaly palpated. MSK:  No focal spinal tenderness to palpation. Full range of motion bilaterally in the upper extremities. EXTREMITIES:  No peripheral edema.   SKIN:  Clear with no obvious rashes or skin changes. No nail dyscrasia. NEURO:  Nonfocal. Well oriented.  Appropriate affect.   LABORATORY DATA:  I have reviewed the data as listed    Component Value Date/Time   NA 144 04/02/2014 0936   K 4.1 04/02/2014 0936   CL 104 06/10/2013 1246   CO2 27 04/02/2014 0936   GLUCOSE 116 04/02/2014 0936   GLUCOSE 95 06/10/2013 1246   BUN 16.9 04/02/2014 0936   CREATININE 0.8 04/02/2014 0936   CALCIUM 9.8 04/02/2014 0936   PROT 7.1 04/02/2014 0936   ALBUMIN 4.0 04/02/2014 0936   AST 12 04/02/2014 0936   ALT 8 04/02/2014 0936   ALKPHOS 59 04/02/2014 0936   BILITOT 0.37 04/02/2014 0936    No results found for this basename: SPEP,  UPEP,   kappa and  lambda light chains    Lab Results  Component Value Date   WBC 5.5 10/08/2014   NEUTROABS 2.9 10/08/2014   HGB 14.5 10/08/2014   HCT 44.7 10/08/2014   MCV 89.1 10/08/2014   PLT 265 10/08/2014      Chemistry      Component Value Date/Time   NA 144 04/02/2014 0936   K 4.1 04/02/2014 0936   CL 104 06/10/2013 1246   CO2 27 04/02/2014 0936   BUN 16.9 04/02/2014 0936   CREATININE 0.8 04/02/2014 0936      Component Value Date/Time   CALCIUM 9.8 04/02/2014 0936   ALKPHOS 59 04/02/2014 0936   AST 12 04/02/2014 0936   ALT 8 04/02/2014 0936   BILITOT 0.37 04/02/2014 0936       RADIOGRAPHIC STUDIES: I have personally reviewed the radiological images as listed and agreed with the findings in the report. No results found.    ASSESSMENT:   80 year old female with  #1 diagnosis of stage II (T2 Nx) invasive lobular carcinoma of the right breast status post neoadjuvant antiestrogen therapy initially for 7 months followed by bilateral lumpectomies. On the right patient had a 2.7 cm residual disease that was grade 1. On the left she only had atypical lobular hyperplasia and sclerosing lesion with lobular neoplasia.  The patient did not receive adjuvant radiation therapy. She however has been continued on adjuvant antiestrogen therapy with letrozole 2.5 mg daily.    PLAN:   Patient is doing well today.  She has no sign of recurrence.  She is tolerating the letrozole daily, she is having tolerable side effects.  Her CBC is normal.  I reviewed this with her in detail.  Her CMP and Vitamin D level are pending.    We reviewed her breast cancer history, neoadjuvant and adjuvant anti-estrogen therapy in detail.  I updated her history and health maintenance.  I recommended healthy diet, exercise and monthly breast exams.  Her mammogram is overdue and I ordered it.  We reviewed that she due to her breast density  she may be a candidate for breast MRI's we will have to see what it is on her next mammo.     Zyah will return in 6 months for labs and f/u with Dr. Lindi Adie.   All questions were answered. The patient knows to call the clinic with any problems, questions or concerns. No barriers to learning was detected. I spent 40 minutes counseling the patient face to face. The total time spent in the appointment was 50 minutes and more than 50% was on counseling.    Minette Headland, [place] [phone]  10/08/2014 10:28 AM

## 2014-10-09 LAB — VITAMIN D 25 HYDROXY (VIT D DEFICIENCY, FRACTURES): Vit D, 25-Hydroxy: 57 ng/mL (ref 30–89)

## 2014-10-20 ENCOUNTER — Ambulatory Visit
Admission: RE | Admit: 2014-10-20 | Discharge: 2014-10-20 | Disposition: A | Discharge status: Home / Self Care | Payer: Medicare Other | Source: Ambulatory Visit | Attending: Adult Health | Admitting: Adult Health

## 2014-10-20 ENCOUNTER — Other Ambulatory Visit: Payer: Self-pay | Admitting: Adult Health

## 2014-10-20 DIAGNOSIS — Z853 Personal history of malignant neoplasm of breast: Secondary | ICD-10-CM

## 2014-11-17 ENCOUNTER — Telehealth: Payer: Self-pay

## 2014-11-17 NOTE — Telephone Encounter (Signed)
Pt left message on scheduling phone re: medication situation.  Returned pt call.  She reports taking letrozole and sudden onset of achiness and soreness over the past few weeks.  Patient stopped taking letrozole before thanksgiving.   When I asked patient if discontinuing the letrozole improved her symptoms, she responded "I just stopped it a dew days ago".  Pt reports pain starting in her left upper thigh starting Friday.  States pain is so bad she can "hardly walk" - any weight bearing movement causes pain.  Describes the pain as intermittent, "shoots down the leg from the thigh to the knee.  Patient is unable to rate the pain.    Advised pt that sudden onset pain not likely caused by the letrozole.  Suggested pt see her PCP and if PCP feels she needs additional studies or to be seen we will be happy to bring her in for appt.  Pt voiced understanding.

## 2014-11-20 DIAGNOSIS — M25551 Pain in right hip: Secondary | ICD-10-CM | POA: Diagnosis not present

## 2014-11-20 DIAGNOSIS — M9903 Segmental and somatic dysfunction of lumbar region: Secondary | ICD-10-CM | POA: Diagnosis not present

## 2014-11-20 DIAGNOSIS — M9905 Segmental and somatic dysfunction of pelvic region: Secondary | ICD-10-CM | POA: Diagnosis not present

## 2014-11-20 DIAGNOSIS — M9904 Segmental and somatic dysfunction of sacral region: Secondary | ICD-10-CM | POA: Diagnosis not present

## 2014-11-20 DIAGNOSIS — M5417 Radiculopathy, lumbosacral region: Secondary | ICD-10-CM | POA: Diagnosis not present

## 2014-11-20 DIAGNOSIS — M5137 Other intervertebral disc degeneration, lumbosacral region: Secondary | ICD-10-CM | POA: Diagnosis not present

## 2014-11-20 DIAGNOSIS — M25552 Pain in left hip: Secondary | ICD-10-CM | POA: Diagnosis not present

## 2014-11-24 DIAGNOSIS — M9903 Segmental and somatic dysfunction of lumbar region: Secondary | ICD-10-CM | POA: Diagnosis not present

## 2014-11-24 DIAGNOSIS — M9905 Segmental and somatic dysfunction of pelvic region: Secondary | ICD-10-CM | POA: Diagnosis not present

## 2014-11-24 DIAGNOSIS — M5137 Other intervertebral disc degeneration, lumbosacral region: Secondary | ICD-10-CM | POA: Diagnosis not present

## 2014-11-24 DIAGNOSIS — M25551 Pain in right hip: Secondary | ICD-10-CM | POA: Diagnosis not present

## 2014-11-24 DIAGNOSIS — M5417 Radiculopathy, lumbosacral region: Secondary | ICD-10-CM | POA: Diagnosis not present

## 2014-11-24 DIAGNOSIS — M25552 Pain in left hip: Secondary | ICD-10-CM | POA: Diagnosis not present

## 2014-11-24 DIAGNOSIS — M9904 Segmental and somatic dysfunction of sacral region: Secondary | ICD-10-CM | POA: Diagnosis not present

## 2014-11-25 DIAGNOSIS — M25551 Pain in right hip: Secondary | ICD-10-CM | POA: Diagnosis not present

## 2014-11-25 DIAGNOSIS — M9903 Segmental and somatic dysfunction of lumbar region: Secondary | ICD-10-CM | POA: Diagnosis not present

## 2014-11-25 DIAGNOSIS — M9904 Segmental and somatic dysfunction of sacral region: Secondary | ICD-10-CM | POA: Diagnosis not present

## 2014-11-25 DIAGNOSIS — M9905 Segmental and somatic dysfunction of pelvic region: Secondary | ICD-10-CM | POA: Diagnosis not present

## 2014-11-25 DIAGNOSIS — M5417 Radiculopathy, lumbosacral region: Secondary | ICD-10-CM | POA: Diagnosis not present

## 2014-11-25 DIAGNOSIS — M5137 Other intervertebral disc degeneration, lumbosacral region: Secondary | ICD-10-CM | POA: Diagnosis not present

## 2014-11-25 DIAGNOSIS — M25552 Pain in left hip: Secondary | ICD-10-CM | POA: Diagnosis not present

## 2014-11-27 DIAGNOSIS — M9904 Segmental and somatic dysfunction of sacral region: Secondary | ICD-10-CM | POA: Diagnosis not present

## 2014-11-27 DIAGNOSIS — M9903 Segmental and somatic dysfunction of lumbar region: Secondary | ICD-10-CM | POA: Diagnosis not present

## 2014-11-27 DIAGNOSIS — M9905 Segmental and somatic dysfunction of pelvic region: Secondary | ICD-10-CM | POA: Diagnosis not present

## 2014-11-27 DIAGNOSIS — M25552 Pain in left hip: Secondary | ICD-10-CM | POA: Diagnosis not present

## 2014-11-27 DIAGNOSIS — M5417 Radiculopathy, lumbosacral region: Secondary | ICD-10-CM | POA: Diagnosis not present

## 2014-11-27 DIAGNOSIS — M25551 Pain in right hip: Secondary | ICD-10-CM | POA: Diagnosis not present

## 2014-11-27 DIAGNOSIS — M5137 Other intervertebral disc degeneration, lumbosacral region: Secondary | ICD-10-CM | POA: Diagnosis not present

## 2014-12-01 DIAGNOSIS — M9905 Segmental and somatic dysfunction of pelvic region: Secondary | ICD-10-CM | POA: Diagnosis not present

## 2014-12-01 DIAGNOSIS — M25551 Pain in right hip: Secondary | ICD-10-CM | POA: Diagnosis not present

## 2014-12-01 DIAGNOSIS — M5137 Other intervertebral disc degeneration, lumbosacral region: Secondary | ICD-10-CM | POA: Diagnosis not present

## 2014-12-01 DIAGNOSIS — M9903 Segmental and somatic dysfunction of lumbar region: Secondary | ICD-10-CM | POA: Diagnosis not present

## 2014-12-01 DIAGNOSIS — M5417 Radiculopathy, lumbosacral region: Secondary | ICD-10-CM | POA: Diagnosis not present

## 2014-12-01 DIAGNOSIS — M25552 Pain in left hip: Secondary | ICD-10-CM | POA: Diagnosis not present

## 2014-12-01 DIAGNOSIS — M9904 Segmental and somatic dysfunction of sacral region: Secondary | ICD-10-CM | POA: Diagnosis not present

## 2014-12-02 DIAGNOSIS — M9905 Segmental and somatic dysfunction of pelvic region: Secondary | ICD-10-CM | POA: Diagnosis not present

## 2014-12-02 DIAGNOSIS — M25551 Pain in right hip: Secondary | ICD-10-CM | POA: Diagnosis not present

## 2014-12-02 DIAGNOSIS — M9903 Segmental and somatic dysfunction of lumbar region: Secondary | ICD-10-CM | POA: Diagnosis not present

## 2014-12-02 DIAGNOSIS — M9904 Segmental and somatic dysfunction of sacral region: Secondary | ICD-10-CM | POA: Diagnosis not present

## 2014-12-02 DIAGNOSIS — M5137 Other intervertebral disc degeneration, lumbosacral region: Secondary | ICD-10-CM | POA: Diagnosis not present

## 2014-12-02 DIAGNOSIS — M25552 Pain in left hip: Secondary | ICD-10-CM | POA: Diagnosis not present

## 2014-12-02 DIAGNOSIS — M5417 Radiculopathy, lumbosacral region: Secondary | ICD-10-CM | POA: Diagnosis not present

## 2014-12-03 ENCOUNTER — Ambulatory Visit (INDEPENDENT_AMBULATORY_CARE_PROVIDER_SITE_OTHER): Payer: Medicare Other | Admitting: Gastroenterology

## 2014-12-03 ENCOUNTER — Encounter: Payer: Self-pay | Admitting: Gastroenterology

## 2014-12-03 VITALS — BP 118/64 | HR 84 | Ht 65.0 in | Wt 139.4 lb

## 2014-12-03 DIAGNOSIS — Z86010 Personal history of colonic polyps: Secondary | ICD-10-CM

## 2014-12-03 DIAGNOSIS — R197 Diarrhea, unspecified: Secondary | ICD-10-CM

## 2014-12-03 DIAGNOSIS — Z860101 Personal history of adenomatous and serrated colon polyps: Secondary | ICD-10-CM

## 2014-12-03 NOTE — Progress Notes (Signed)
    History of Present Illness: This is a 80 year old female with a past history of diarrhea felt secondary to bacterial overgrowth. She has been maintained on daily Align with excellent control of her intermittent diarrhea. She has no gastrointestinal complaints.   Current Medications, Allergies, Past Medical History, Past Surgical History, Family History and Social History were reviewed in [employer] record.  Physical Exam: General: Well developed , well nourished, no acute distress Head: Normocephalic and atraumatic Eyes:  sclerae anicteric, EOMI Ears: Normal auditory acuity Mouth: No deformity or lesions Lungs: Clear throughout to auscultation Heart: Regular rate and rhythm; no murmurs, rubs or bruits Abdomen: Soft, non tender and non distended. No masses, hepatosplenomegaly or hernias noted. Normal Bowel sounds Musculoskeletal: Symmetrical with no gross deformities  Pulses:  Normal pulses noted Extremities: No clubbing, cyanosis, edema or deformities noted Neurological: Alert oriented x 4, grossly nonfocal Psychological:  Alert and cooperative. Normal mood and affect  Assessment and Recommendations:  1. Intermittent diarrhea. Symptoms under excellent control on daily Align. Continue Align daily long-term and follow-up with her PCP.  2. Personal history of tubulovillous adenomatous colon polyps. Last colonoscopy in 11/2011 was free of polyps. Due to her age and her last colonoscopy being polyp free, standard surveillance colonoscopies have been discontinued.

## 2014-12-03 NOTE — Patient Instructions (Addendum)
Continue Align daily. Follow up as needed.  cc:  Domenick Gong MD

## 2014-12-04 DIAGNOSIS — M9903 Segmental and somatic dysfunction of lumbar region: Secondary | ICD-10-CM | POA: Diagnosis not present

## 2014-12-04 DIAGNOSIS — M25552 Pain in left hip: Secondary | ICD-10-CM | POA: Diagnosis not present

## 2014-12-04 DIAGNOSIS — M25551 Pain in right hip: Secondary | ICD-10-CM | POA: Diagnosis not present

## 2014-12-04 DIAGNOSIS — M9904 Segmental and somatic dysfunction of sacral region: Secondary | ICD-10-CM | POA: Diagnosis not present

## 2014-12-04 DIAGNOSIS — M5137 Other intervertebral disc degeneration, lumbosacral region: Secondary | ICD-10-CM | POA: Diagnosis not present

## 2014-12-04 DIAGNOSIS — M5417 Radiculopathy, lumbosacral region: Secondary | ICD-10-CM | POA: Diagnosis not present

## 2014-12-04 DIAGNOSIS — M9905 Segmental and somatic dysfunction of pelvic region: Secondary | ICD-10-CM | POA: Diagnosis not present

## 2014-12-08 DIAGNOSIS — M25551 Pain in right hip: Secondary | ICD-10-CM | POA: Diagnosis not present

## 2014-12-08 DIAGNOSIS — M9904 Segmental and somatic dysfunction of sacral region: Secondary | ICD-10-CM | POA: Diagnosis not present

## 2014-12-08 DIAGNOSIS — M9905 Segmental and somatic dysfunction of pelvic region: Secondary | ICD-10-CM | POA: Diagnosis not present

## 2014-12-08 DIAGNOSIS — M25552 Pain in left hip: Secondary | ICD-10-CM | POA: Diagnosis not present

## 2014-12-08 DIAGNOSIS — M5137 Other intervertebral disc degeneration, lumbosacral region: Secondary | ICD-10-CM | POA: Diagnosis not present

## 2014-12-08 DIAGNOSIS — M5417 Radiculopathy, lumbosacral region: Secondary | ICD-10-CM | POA: Diagnosis not present

## 2014-12-08 DIAGNOSIS — M9903 Segmental and somatic dysfunction of lumbar region: Secondary | ICD-10-CM | POA: Diagnosis not present

## 2014-12-09 DIAGNOSIS — M25552 Pain in left hip: Secondary | ICD-10-CM | POA: Diagnosis not present

## 2014-12-09 DIAGNOSIS — M9903 Segmental and somatic dysfunction of lumbar region: Secondary | ICD-10-CM | POA: Diagnosis not present

## 2014-12-09 DIAGNOSIS — M9904 Segmental and somatic dysfunction of sacral region: Secondary | ICD-10-CM | POA: Diagnosis not present

## 2014-12-09 DIAGNOSIS — M9905 Segmental and somatic dysfunction of pelvic region: Secondary | ICD-10-CM | POA: Diagnosis not present

## 2014-12-09 DIAGNOSIS — M5417 Radiculopathy, lumbosacral region: Secondary | ICD-10-CM | POA: Diagnosis not present

## 2014-12-09 DIAGNOSIS — M5137 Other intervertebral disc degeneration, lumbosacral region: Secondary | ICD-10-CM | POA: Diagnosis not present

## 2014-12-09 DIAGNOSIS — M25551 Pain in right hip: Secondary | ICD-10-CM | POA: Diagnosis not present

## 2014-12-10 DIAGNOSIS — M5417 Radiculopathy, lumbosacral region: Secondary | ICD-10-CM | POA: Diagnosis not present

## 2014-12-10 DIAGNOSIS — M9905 Segmental and somatic dysfunction of pelvic region: Secondary | ICD-10-CM | POA: Diagnosis not present

## 2014-12-10 DIAGNOSIS — M9904 Segmental and somatic dysfunction of sacral region: Secondary | ICD-10-CM | POA: Diagnosis not present

## 2014-12-10 DIAGNOSIS — M25552 Pain in left hip: Secondary | ICD-10-CM | POA: Diagnosis not present

## 2014-12-10 DIAGNOSIS — M25551 Pain in right hip: Secondary | ICD-10-CM | POA: Diagnosis not present

## 2014-12-10 DIAGNOSIS — M5137 Other intervertebral disc degeneration, lumbosacral region: Secondary | ICD-10-CM | POA: Diagnosis not present

## 2014-12-10 DIAGNOSIS — M9903 Segmental and somatic dysfunction of lumbar region: Secondary | ICD-10-CM | POA: Diagnosis not present

## 2014-12-15 DIAGNOSIS — Z6823 Body mass index (BMI) 23.0-23.9, adult: Secondary | ICD-10-CM | POA: Diagnosis not present

## 2014-12-15 DIAGNOSIS — M5416 Radiculopathy, lumbar region: Secondary | ICD-10-CM | POA: Diagnosis not present

## 2014-12-25 DIAGNOSIS — M545 Low back pain: Secondary | ICD-10-CM | POA: Diagnosis not present

## 2014-12-25 DIAGNOSIS — M47816 Spondylosis without myelopathy or radiculopathy, lumbar region: Secondary | ICD-10-CM | POA: Diagnosis not present

## 2014-12-25 DIAGNOSIS — M79652 Pain in left thigh: Secondary | ICD-10-CM | POA: Diagnosis not present

## 2015-01-08 ENCOUNTER — Other Ambulatory Visit: Payer: Self-pay | Admitting: *Deleted

## 2015-01-08 DIAGNOSIS — C50911 Malignant neoplasm of unspecified site of right female breast: Secondary | ICD-10-CM

## 2015-01-08 MED ORDER — LETROZOLE 2.5 MG PO TABS
2.5000 mg | ORAL_TABLET | Freq: Every day | ORAL | Status: DC
Start: 2015-01-08 — End: 2016-10-17

## 2015-01-14 DIAGNOSIS — M545 Low back pain: Secondary | ICD-10-CM | POA: Diagnosis not present

## 2015-01-14 DIAGNOSIS — M47816 Spondylosis without myelopathy or radiculopathy, lumbar region: Secondary | ICD-10-CM | POA: Diagnosis not present

## 2015-01-14 DIAGNOSIS — M79652 Pain in left thigh: Secondary | ICD-10-CM | POA: Diagnosis not present

## 2015-02-16 DIAGNOSIS — M79652 Pain in left thigh: Secondary | ICD-10-CM | POA: Diagnosis not present

## 2015-02-16 DIAGNOSIS — M47816 Spondylosis without myelopathy or radiculopathy, lumbar region: Secondary | ICD-10-CM | POA: Diagnosis not present

## 2015-02-16 DIAGNOSIS — M545 Low back pain: Secondary | ICD-10-CM | POA: Diagnosis not present

## 2015-02-27 DIAGNOSIS — H01004 Unspecified blepharitis left upper eyelid: Secondary | ICD-10-CM | POA: Diagnosis not present

## 2015-02-27 DIAGNOSIS — H01001 Unspecified blepharitis right upper eyelid: Secondary | ICD-10-CM | POA: Diagnosis not present

## 2015-02-27 DIAGNOSIS — H10403 Unspecified chronic conjunctivitis, bilateral: Secondary | ICD-10-CM | POA: Diagnosis not present

## 2015-02-27 DIAGNOSIS — Z961 Presence of intraocular lens: Secondary | ICD-10-CM | POA: Diagnosis not present

## 2015-03-17 DIAGNOSIS — R8290 Unspecified abnormal findings in urine: Secondary | ICD-10-CM | POA: Diagnosis not present

## 2015-03-17 DIAGNOSIS — R8299 Other abnormal findings in urine: Secondary | ICD-10-CM | POA: Diagnosis not present

## 2015-03-17 DIAGNOSIS — M859 Disorder of bone density and structure, unspecified: Secondary | ICD-10-CM | POA: Diagnosis not present

## 2015-03-17 DIAGNOSIS — N183 Chronic kidney disease, stage 3 (moderate): Secondary | ICD-10-CM | POA: Diagnosis not present

## 2015-03-17 DIAGNOSIS — E785 Hyperlipidemia, unspecified: Secondary | ICD-10-CM | POA: Diagnosis not present

## 2015-03-24 DIAGNOSIS — Z79899 Other long term (current) drug therapy: Secondary | ICD-10-CM | POA: Diagnosis not present

## 2015-03-24 DIAGNOSIS — Z008 Encounter for other general examination: Secondary | ICD-10-CM | POA: Diagnosis not present

## 2015-03-24 DIAGNOSIS — Z1389 Encounter for screening for other disorder: Secondary | ICD-10-CM | POA: Diagnosis not present

## 2015-03-24 DIAGNOSIS — N183 Chronic kidney disease, stage 3 (moderate): Secondary | ICD-10-CM | POA: Diagnosis not present

## 2015-03-24 DIAGNOSIS — G4700 Insomnia, unspecified: Secondary | ICD-10-CM | POA: Diagnosis not present

## 2015-03-24 DIAGNOSIS — M5416 Radiculopathy, lumbar region: Secondary | ICD-10-CM | POA: Diagnosis not present

## 2015-03-24 DIAGNOSIS — C50919 Malignant neoplasm of unspecified site of unspecified female breast: Secondary | ICD-10-CM | POA: Diagnosis not present

## 2015-03-24 DIAGNOSIS — H269 Unspecified cataract: Secondary | ICD-10-CM | POA: Diagnosis not present

## 2015-03-24 DIAGNOSIS — E785 Hyperlipidemia, unspecified: Secondary | ICD-10-CM | POA: Diagnosis not present

## 2015-03-24 DIAGNOSIS — M859 Disorder of bone density and structure, unspecified: Secondary | ICD-10-CM | POA: Diagnosis not present

## 2015-03-24 DIAGNOSIS — Z6822 Body mass index (BMI) 22.0-22.9, adult: Secondary | ICD-10-CM | POA: Diagnosis not present

## 2015-04-01 DIAGNOSIS — Z1212 Encounter for screening for malignant neoplasm of rectum: Secondary | ICD-10-CM | POA: Diagnosis not present

## 2015-04-10 ENCOUNTER — Other Ambulatory Visit: Payer: Self-pay

## 2015-04-10 DIAGNOSIS — C50911 Malignant neoplasm of unspecified site of right female breast: Secondary | ICD-10-CM

## 2015-04-12 NOTE — Assessment & Plan Note (Signed)
stage II (T2 Nx) invasive lobular carcinoma of the right breast status post neoadjuvant antiestrogen therapy initially for 7 months followed by bilateral lumpectomies. On the right patient had a 2.7 cm residual disease that was grade 1. On the left she only had atypical lobular hyperplasia and sclerosing lesion with lobular neoplasia. The patient did not receive adjuvant radiation therapy. She however has been continued on adjuvant antiestrogen therapy with letrozole 2.5 mg daily.  Letrozole Toxicities  Breast Cancer Surveillance: 1. Breast exam 04/13/15 Normal 2. Mammogram 10/20/14 No abnormalities. Postsurgical changes. Breast Density Category C. I recommended that she get 3-D mammograms for surveillance. Discussed the differences between different breast density categories.  RTC 6 months

## 2015-04-13 ENCOUNTER — Other Ambulatory Visit: Payer: Medicare Other

## 2015-04-13 ENCOUNTER — Ambulatory Visit (HOSPITAL_BASED_OUTPATIENT_CLINIC_OR_DEPARTMENT_OTHER): Payer: Medicare Other | Admitting: Hematology and Oncology

## 2015-04-13 ENCOUNTER — Other Ambulatory Visit (HOSPITAL_BASED_OUTPATIENT_CLINIC_OR_DEPARTMENT_OTHER): Payer: Medicare Other

## 2015-04-13 ENCOUNTER — Telehealth: Payer: Self-pay | Admitting: Hematology and Oncology

## 2015-04-13 VITALS — BP 125/58 | HR 77 | Temp 97.4°F | Resp 18 | Ht 65.0 in | Wt 136.2 lb

## 2015-04-13 DIAGNOSIS — M791 Myalgia: Secondary | ICD-10-CM | POA: Diagnosis not present

## 2015-04-13 DIAGNOSIS — C50211 Malignant neoplasm of upper-inner quadrant of right female breast: Secondary | ICD-10-CM

## 2015-04-13 DIAGNOSIS — C50911 Malignant neoplasm of unspecified site of right female breast: Secondary | ICD-10-CM

## 2015-04-13 LAB — CBC WITH DIFFERENTIAL/PLATELET
BASO%: 1.1 % (ref 0.0–2.0)
BASOS ABS: 0.1 10*3/uL (ref 0.0–0.1)
EOS ABS: 0.2 10*3/uL (ref 0.0–0.5)
EOS%: 4.1 % (ref 0.0–7.0)
HEMATOCRIT: 44.2 % (ref 34.8–46.6)
HEMOGLOBIN: 14.4 g/dL (ref 11.6–15.9)
LYMPH%: 32.5 % (ref 14.0–49.7)
MCH: 29.4 pg (ref 25.1–34.0)
MCHC: 32.6 g/dL (ref 31.5–36.0)
MCV: 90.2 fL (ref 79.5–101.0)
MONO#: 0.4 10*3/uL (ref 0.1–0.9)
MONO%: 8 % (ref 0.0–14.0)
NEUT#: 2.9 10*3/uL (ref 1.5–6.5)
NEUT%: 54.3 % (ref 38.4–76.8)
Platelets: 227 10*3/uL (ref 145–400)
RBC: 4.9 10*6/uL (ref 3.70–5.45)
RDW: 13.7 % (ref 11.2–14.5)
WBC: 5.4 10*3/uL (ref 3.9–10.3)
lymph#: 1.8 10*3/uL (ref 0.9–3.3)

## 2015-04-13 LAB — COMPREHENSIVE METABOLIC PANEL (CC13)
ALT: 10 U/L (ref 0–55)
AST: 11 U/L (ref 5–34)
Albumin: 3.8 g/dL (ref 3.5–5.0)
Alkaline Phosphatase: 54 U/L (ref 40–150)
Anion Gap: 11 mEq/L (ref 3–11)
BILIRUBIN TOTAL: 0.53 mg/dL (ref 0.20–1.20)
BUN: 12.9 mg/dL (ref 7.0–26.0)
CHLORIDE: 105 meq/L (ref 98–109)
CO2: 25 mEq/L (ref 22–29)
CREATININE: 0.8 mg/dL (ref 0.6–1.1)
Calcium: 9.5 mg/dL (ref 8.4–10.4)
EGFR: 65 mL/min/{1.73_m2} — ABNORMAL LOW (ref >90)
Glucose: 97 mg/dl (ref 70–140)
POTASSIUM: 4.3 meq/L (ref 3.5–5.1)
SODIUM: 141 meq/L (ref 136–145)
Total Protein: 6.9 g/dL (ref 6.4–8.3)

## 2015-04-13 NOTE — Progress Notes (Signed)
Patient Care Team: Drenda Freeze, MD as PCP - General (Internal Medicine) Consuela Mimes, MD as Consulting Physician (Oncology)  DIAGNOSIS: Breast cancer of upper-inner quadrant of right female breast   Staging form: Breast, AJCC 7th Edition     Pathologic: Stage Unknown (T2, NX, cM0) - Signed by Thea Silversmith, MD on 11/27/2013   SUMMARY OF ONCOLOGIC HISTORY:   Breast cancer of upper-inner quadrant of right female breast   05/08/2013 Initial Biopsy Right: grade 1-2 invasive mammary carcinoma ER positive PR positive HER-2/neu negative with Ki-67 30% (MRI 2.8 cm)second smaller mass together 3.8cm   05/11/2013 - 11/04/2013 Anti-estrogen oral therapy Letrozole 2.5 mg Neoadjuvant anti-estrogen therapy   11/06/2013 Surgery Bilateral Lumpectomies: Left: sclerosing lesion with ALH fibrocystic changes with microcalcifications. Right: Grade 1 ILC 2.7 cm with LCIS    Radiation Therapy Patient declined   11/19/2013 -  Anti-estrogen oral therapy Letrozole 2.5 mg    CHIEF COMPLIANT: follow-up of breast cancer on letrozole  INTERVAL HISTORY: [name] French is a 81 year old lady with above-mentioned history of a lateral lumpectomy send right-sided invasive lobular cancer with LCIS would declined radiation therapy and is currently on omeprazole. She is tolerating it fairly well. She does have muscle aches and pains especially in the legs. It is unclear if is related to arthritis from a pinched nerve or to letrozole. She was recently started on Fosamax.  REVIEW OF SYSTEMS:   Constitutional: Denies fevers, chills or abnormal weight loss Eyes: Denies blurriness of vision Ears, nose, mouth, throat, and face: Denies mucositis or sore throat Respiratory: Denies cough, dyspnea or wheezes Cardiovascular: Denies palpitation, chest discomfort or lower extremity swelling Gastrointestinal:  Denies nausea, heartburn or change in bowel habits Skin: Denies abnormal skin rashes Lymphatics: Denies new lymphadenopathy or  easy bruising Neurological:musculoskeletal aches and pains and pinched nerve Behavioral/Psych: Mood is stable, no new changes  Breast:  denies any pain or lumps or nodules in either breasts All other systems were reviewed with the patient and are negative.  I have reviewed the past medical history, past surgical history, social history and family history with the patient and they are unchanged from previous note.  ALLERGIES:  is allergic to hydrocodone; codeine; and neosporin.  MEDICATIONS:  Current Outpatient Prescriptions  Medication Sig Dispense Refill  . alendronate (FOSAMAX) 70 MG tablet     . aspirin 81 MG tablet Take 81 mg by mouth daily.    . Cholecalciferol (VITAMIN D PO) Take 1 tablet by mouth 2 (two) times daily.     . Cholecalciferol (VITAMIN D-3) 1000 UNITS CAPS Take 1 capsule by mouth daily.    . Doxylamine Succinate, Sleep, (UNISOM PO) Take by mouth at bedtime.    Marland Kitchen GLUCOSAMINE-CHONDROITIN PO Take 1 tablet by mouth daily.     Marland Kitchen letrozole (FEMARA) 2.5 MG tablet Take 1 tablet (2.5 mg total) by mouth daily. 90 tablet 3  . Loperamide HCl (IMODIUM PO) Take 1 tablet by mouth as needed.      . pravastatin (PRAVACHOL) 40 MG tablet     . Probiotic Product (ALIGN PO) Take 1 tablet by mouth daily.      Marland Kitchen zolpidem (AMBIEN) 5 MG tablet   2   No current facility-administered medications for this visit.    PHYSICAL EXAMINATION: ECOG PERFORMANCE STATUS: 1 - Symptomatic but completely ambulatory  Filed Vitals:   04/13/15 0958  BP: 125/58  Pulse: 77  Temp: 97.4 F (36.3 C)  Resp: 18   Filed Weights   04/13/15 0958  Weight: 136 lb 3 oz (61.774 kg)    GENERAL:alert, no distress and comfortable SKIN: skin color, texture, turgor are normal, no rashes or significant lesions EYES: normal, Conjunctiva are pink and non-injected, sclera clear OROPHARYNX:no exudate, no erythema and lips, buccal mucosa, and tongue normal  NECK: supple, thyroid normal size, non-tender, without  nodularity LYMPH:  no palpable lymphadenopathy in the cervical, axillary or inguinal LUNGS: clear to auscultation and percussion with normal breathing effort HEART: regular rate & rhythm and no murmurs and no lower extremity edema ABDOMEN:abdomen soft, non-tender and normal bowel sounds Musculoskeletal:no cyanosis of digits and no clubbing  NEURO: alert & oriented x 3 with fluent speech, no focal motor/sensory deficits BREAST: No palpable masses or nodules in either right or left breasts. No palpable axillary supraclavicular or infraclavicular adenopathy no breast tenderness or nipple discharge. (exam performed in the presence of a chaperone)  LABORATORY DATA:  I have reviewed the data as listed   Chemistry      Component Value Date/Time   NA 141 04/13/2015 0946   K 4.3 04/13/2015 0946   CL 104 06/10/2013 1246   CO2 25 04/13/2015 0946   BUN 12.9 04/13/2015 0946   CREATININE 0.8 04/13/2015 0946      Component Value Date/Time   CALCIUM 9.5 04/13/2015 0946   ALKPHOS 54 04/13/2015 0946   AST 11 04/13/2015 0946   ALT 10 04/13/2015 0946   BILITOT 0.53 04/13/2015 0946       Lab Results  Component Value Date   WBC 5.4 04/13/2015   HGB 14.4 04/13/2015   HCT 44.2 04/13/2015   MCV 90.2 04/13/2015   PLT 227 04/13/2015   NEUTROABS 2.9 04/13/2015    ASSESSMENT & PLAN:  Breast cancer of upper-inner quadrant of right female breast stage II (T2 Nx) invasive lobular carcinoma of the right breast status post neoadjuvant antiestrogen therapy initially for 7 months followed by bilateral lumpectomies. On the right patient had a 2.7 cm residual disease that was grade 1. On the left she only had atypical lobular hyperplasia and sclerosing lesion with lobular neoplasia. The patient did not receive adjuvant radiation therapy. She however has been continued on adjuvant antiestrogen therapy with letrozole 2.5 mg daily.  Letrozole Toxicities 1. Myalgias 2. Loss of bone density: I do not have her  bone density results but she was started back on Fosamax with calcium and vitamin D by her primary care physician. I encouraged her to continue on this medication.  Our plan is to continue letrozole for total of 5 years. If she can tolerate it.  Breast Cancer Surveillance: 1. Breast exam 04/13/15 Normal 2. Mammogram 10/20/14 No abnormalities. Postsurgical changes. Breast Density Category C. I recommended that she get 3-D mammograms for surveillance. Discussed the differences between different breast density categories.  RTC 6 months   No orders of the defined types were placed in this encounter.   The patient has a good understanding of the overall plan. she agrees with it. She will call with any problems that may develop before her next visit here.   Rulon Eisenmenger, MD

## 2015-04-13 NOTE — Telephone Encounter (Signed)
Appointments made and avs printed for patient °

## 2015-06-11 DIAGNOSIS — M545 Low back pain: Secondary | ICD-10-CM | POA: Diagnosis not present

## 2015-06-11 DIAGNOSIS — M79652 Pain in left thigh: Secondary | ICD-10-CM | POA: Diagnosis not present

## 2015-06-11 DIAGNOSIS — M47816 Spondylosis without myelopathy or radiculopathy, lumbar region: Secondary | ICD-10-CM | POA: Diagnosis not present

## 2015-06-24 DIAGNOSIS — Z85828 Personal history of other malignant neoplasm of skin: Secondary | ICD-10-CM | POA: Diagnosis not present

## 2015-06-24 DIAGNOSIS — L821 Other seborrheic keratosis: Secondary | ICD-10-CM | POA: Diagnosis not present

## 2015-06-24 DIAGNOSIS — L739 Follicular disorder, unspecified: Secondary | ICD-10-CM | POA: Diagnosis not present

## 2015-06-24 DIAGNOSIS — D1801 Hemangioma of skin and subcutaneous tissue: Secondary | ICD-10-CM | POA: Diagnosis not present

## 2015-06-24 DIAGNOSIS — L812 Freckles: Secondary | ICD-10-CM | POA: Diagnosis not present

## 2015-07-13 DIAGNOSIS — Z6822 Body mass index (BMI) 22.0-22.9, adult: Secondary | ICD-10-CM | POA: Diagnosis not present

## 2015-07-13 DIAGNOSIS — E785 Hyperlipidemia, unspecified: Secondary | ICD-10-CM | POA: Diagnosis not present

## 2015-07-13 DIAGNOSIS — M545 Low back pain: Secondary | ICD-10-CM | POA: Diagnosis not present

## 2015-07-13 DIAGNOSIS — Z022 Encounter for examination for admission to residential institution: Secondary | ICD-10-CM | POA: Diagnosis not present

## 2015-07-13 DIAGNOSIS — M47816 Spondylosis without myelopathy or radiculopathy, lumbar region: Secondary | ICD-10-CM | POA: Diagnosis not present

## 2015-07-13 DIAGNOSIS — N183 Chronic kidney disease, stage 3 (moderate): Secondary | ICD-10-CM | POA: Diagnosis not present

## 2015-07-13 DIAGNOSIS — Z1389 Encounter for screening for other disorder: Secondary | ICD-10-CM | POA: Diagnosis not present

## 2015-07-13 DIAGNOSIS — M859 Disorder of bone density and structure, unspecified: Secondary | ICD-10-CM | POA: Diagnosis not present

## 2015-08-19 ENCOUNTER — Encounter: Payer: Self-pay | Admitting: Gastroenterology

## 2015-09-22 ENCOUNTER — Other Ambulatory Visit: Payer: Self-pay | Admitting: Hematology and Oncology

## 2015-09-22 DIAGNOSIS — Z853 Personal history of malignant neoplasm of breast: Secondary | ICD-10-CM

## 2015-10-11 DIAGNOSIS — Z23 Encounter for immunization: Secondary | ICD-10-CM | POA: Diagnosis not present

## 2015-10-13 NOTE — Assessment & Plan Note (Signed)
stage II (T2 Nx) invasive lobular carcinoma of the right breast status post neoadjuvant antiestrogen therapy initially for 7 months followed by bilateral lumpectomies. On the right patient had a 2.7 cm residual disease that was grade 1. On the left she only had atypical lobular hyperplasia and sclerosing lesion with lobular neoplasia. The patient did not receive adjuvant radiation therapy. She however has been continued on adjuvant antiestrogen therapy with letrozole 2.5 mg daily.  Letrozole Toxicities  1. Myalgias  2. Loss of bone density: I do not have her bone density results but she was started back on Fosamax with calcium and vitamin D by her primary care physician. I encouraged her to continue on this medication.  Our plan is to continue letrozole for total of 5 years. If she can tolerate it.   Breast Cancer Surveillance:  1. Breast exam 10/14/15 Normal  2. Mammogram 10/20/14 No abnormalities. Postsurgical changes. Breast Density Category C. I recommended that she get 3-D mammograms for surveillance.  Discussed the differences between different breast density categories.   RTC 1 year

## 2015-10-14 ENCOUNTER — Encounter: Payer: Self-pay | Admitting: Hematology and Oncology

## 2015-10-14 ENCOUNTER — Telehealth: Payer: Self-pay | Admitting: Hematology and Oncology

## 2015-10-14 ENCOUNTER — Ambulatory Visit (HOSPITAL_BASED_OUTPATIENT_CLINIC_OR_DEPARTMENT_OTHER): Payer: Medicare Other | Admitting: Hematology and Oncology

## 2015-10-14 VITALS — BP 137/63 | HR 80 | Temp 98.4°F | Resp 18 | Ht 65.0 in | Wt 136.9 lb

## 2015-10-14 DIAGNOSIS — C50211 Malignant neoplasm of upper-inner quadrant of right female breast: Secondary | ICD-10-CM | POA: Diagnosis not present

## 2015-10-14 DIAGNOSIS — M791 Myalgia: Secondary | ICD-10-CM

## 2015-10-14 NOTE — Progress Notes (Signed)
Patient Care Team: Drenda Freeze, MD as PCP - General (Internal Medicine) Consuela Mimes, MD as Consulting Physician (Oncology)  DIAGNOSIS: Breast cancer of upper-inner quadrant of right female breast [facility])   Staging form: Breast, AJCC 7th Edition     Pathologic: Stage Unknown (T2, NX, cM0) - Signed by Thea Silversmith, MD on 11/27/2013   SUMMARY OF ONCOLOGIC HISTORY:   Breast cancer of upper-inner quadrant of right female breast ([place])   05/08/2013 Initial Biopsy Right: grade 1-2 invasive mammary carcinoma ER positive PR positive HER-2/neu negative with Ki-67 30% (MRI 2.8 cm)second smaller mass together 3.8cm   05/11/2013 - 11/04/2013 Anti-estrogen oral therapy Letrozole 2.5 mg Neoadjuvant anti-estrogen therapy   11/06/2013 Surgery Bilateral Lumpectomies: Left: sclerosing lesion with ALH fibrocystic changes with microcalcifications. Right: Grade 1 ILC 2.7 cm with LCIS    Radiation Therapy Patient declined   11/19/2013 - 06/09/2015 Anti-estrogen oral therapy Letrozole 2.5 mg (stopped for arthralgias and myalgias and fatigue accompanied with hair loss)    CHIEF COMPLIANT: Patient stopped letrozole  INTERVAL HISTORY: [name] French is a 81 year old soon to be 81 year old with above-mentioned history of right breast cancer that is invasive lobular type who was on letrozole and decided to stop it in summer of 2016. She was experiencing muscle aches and pains as well as arthralgias. She was also feeling fatigued and some hair loss. She is felt well since he stopped the medication. She is planning to moving into a retirement home next month.  REVIEW OF SYSTEMS:   Constitutional: Denies fevers, chills or abnormal weight loss Eyes: Denies blurriness of vision Ears, nose, mouth, throat, and face: Denies mucositis or sore throat Respiratory: Denies cough, dyspnea or wheezes Cardiovascular: Denies palpitation, chest discomfort or lower extremity swelling Gastrointestinal:  Denies nausea, heartburn or  change in bowel habits Skin: Denies abnormal skin rashes Lymphatics: Denies new lymphadenopathy or easy bruising Neurological:Denies numbness, tingling or new weaknesses Behavioral/Psych: Mood is stable, no new changes  Breast:  denies any pain or lumps or nodules in either breasts All other systems were reviewed with the patient and are negative.  I have reviewed the past medical history, past surgical history, social history and family history with the patient and they are unchanged from previous note.  ALLERGIES:  is allergic to hydrocodone; codeine; and neosporin.  MEDICATIONS:  Current Outpatient Prescriptions  Medication Sig Dispense Refill  . alendronate (FOSAMAX) 70 MG tablet     . aspirin 81 MG tablet Take 81 mg by mouth daily.    . Cholecalciferol (VITAMIN D PO) Take 1 tablet by mouth 2 (two) times daily.     . Cholecalciferol (VITAMIN D-3) 1000 UNITS CAPS Take 1 capsule by mouth daily.    . Doxylamine Succinate, Sleep, (UNISOM PO) Take by mouth at bedtime.    Marland Kitchen GLUCOSAMINE-CHONDROITIN PO Take 1 tablet by mouth daily.     Marland Kitchen letrozole (FEMARA) 2.5 MG tablet Take 1 tablet (2.5 mg total) by mouth daily. 90 tablet 3  . Loperamide HCl (IMODIUM PO) Take 1 tablet by mouth as needed.      . pravastatin (PRAVACHOL) 40 MG tablet     . Probiotic Product (ALIGN PO) Take 1 tablet by mouth daily.      Marland Kitchen zolpidem (AMBIEN) 5 MG tablet   2   No current facility-administered medications for this visit.    PHYSICAL EXAMINATION: ECOG PERFORMANCE STATUS: 1 - Symptomatic but completely ambulatory  Filed Vitals:   10/14/15 0950  BP: 137/63  Pulse: 80  Temp: 98.4 F (36.9 C)  Resp: 18   Filed Weights   10/14/15 0950  Weight: 136 lb 14.4 oz (62.097 kg)    GENERAL:alert, no distress and comfortable SKIN: skin color, texture, turgor are normal, no rashes or significant lesions EYES: normal, Conjunctiva are pink and non-injected, sclera clear OROPHARYNX:no exudate, no erythema and  lips, buccal mucosa, and tongue normal  NECK: supple, thyroid normal size, non-tender, without nodularity LYMPH:  no palpable lymphadenopathy in the cervical, axillary or inguinal LUNGS: clear to auscultation and percussion with normal breathing effort HEART: regular rate & rhythm and no murmurs and no lower extremity edema ABDOMEN:abdomen soft, non-tender and normal bowel sounds Musculoskeletal:no cyanosis of digits and no clubbing  NEURO: alert & oriented x 3 with fluent speech, no focal motor/sensory deficits BREAST: No palpable masses or nodules in either right or left breasts. No palpable axillary supraclavicular or infraclavicular adenopathy no breast tenderness or nipple discharge. (exam performed in the presence of a chaperone)  LABORATORY DATA:  I have reviewed the data as listed   Chemistry      Component Value Date/Time   NA 141 04/13/2015 0946   K 4.3 04/13/2015 0946   CL 104 06/10/2013 1246   CO2 25 04/13/2015 0946   BUN 12.9 04/13/2015 0946   CREATININE 0.8 04/13/2015 0946      Component Value Date/Time   CALCIUM 9.5 04/13/2015 0946   ALKPHOS 54 04/13/2015 0946   AST 11 04/13/2015 0946   ALT 10 04/13/2015 0946   BILITOT 0.53 04/13/2015 0946       Lab Results  Component Value Date   WBC 5.4 04/13/2015   HGB 14.4 04/13/2015   HCT 44.2 04/13/2015   MCV 90.2 04/13/2015   PLT 227 04/13/2015   NEUTROABS 2.9 04/13/2015   ASSESSMENT & PLAN:  Breast cancer of upper-inner quadrant of right female breast stage II (T2 Nx) invasive lobular carcinoma of the right breast status post neoadjuvant antiestrogen therapy initially for 7 months followed by bilateral lumpectomies. On the right patient had a 2.7 cm residual disease that was grade 1. On the left she only had atypical lobular hyperplasia and sclerosing lesion with lobular neoplasia. The patient did not receive adjuvant radiation therapy. She however has been continued on adjuvant antiestrogen therapy with letrozole 2.5  mg daily.   Letrozole Toxicities  1. Myalgias  2. Loss of bone density: I do not have her bone density results but she was started back on Fosamax with calcium and vitamin D by her primary care physician. I encouraged her to continue on this medication.  Patient stop letrozole therapy. I discussed with her that the invasive lobular cancer has a higher rate of recurrence when antiestrogen therapy is discontinued early. However given her advanced age, it is reasonable to stop it for quality of life reasons. She decided that she wants to take it for another month and see if her symptoms return back. If they do return then she plans to discontinued permanently.  Breast Cancer Surveillance:  1. Breast exam 10/14/15 Normal  2. Mammogram 10/20/14 No abnormalities. Postsurgical changes. Breast Density Category C. I recommended that she get 3-D mammograms for surveillance.  Discussed the differences between different breast density categories.   RTC 1 year   No orders of the defined types were placed in this encounter.   The patient has a good understanding of the overall plan. she agrees with it. she will call with any problems that may develop before the next  visit here.   Rulon Eisenmenger, MD 10/14/2015

## 2015-10-14 NOTE — Telephone Encounter (Signed)
Appointments made and avs printed for patient °

## 2015-11-03 ENCOUNTER — Ambulatory Visit
Admission: RE | Admit: 2015-11-03 | Discharge: 2015-11-03 | Disposition: A | Discharge status: Home / Self Care | Payer: Medicare Other | Source: Ambulatory Visit | Attending: Hematology and Oncology | Admitting: Hematology and Oncology

## 2015-11-03 DIAGNOSIS — R922 Inconclusive mammogram: Secondary | ICD-10-CM | POA: Diagnosis not present

## 2015-11-03 DIAGNOSIS — R928 Other abnormal and inconclusive findings on diagnostic imaging of breast: Secondary | ICD-10-CM | POA: Diagnosis not present

## 2015-11-03 DIAGNOSIS — Z853 Personal history of malignant neoplasm of breast: Secondary | ICD-10-CM

## 2015-11-26 DIAGNOSIS — H10413 Chronic giant papillary conjunctivitis, bilateral: Secondary | ICD-10-CM | POA: Diagnosis not present

## 2015-11-26 DIAGNOSIS — H10503 Unspecified blepharoconjunctivitis, bilateral: Secondary | ICD-10-CM | POA: Diagnosis not present

## 2015-11-26 DIAGNOSIS — H0012 Chalazion right lower eyelid: Secondary | ICD-10-CM | POA: Diagnosis not present

## 2015-11-26 DIAGNOSIS — Z961 Presence of intraocular lens: Secondary | ICD-10-CM | POA: Diagnosis not present

## 2016-03-23 DIAGNOSIS — M859 Disorder of bone density and structure, unspecified: Secondary | ICD-10-CM | POA: Diagnosis not present

## 2016-03-23 DIAGNOSIS — N183 Chronic kidney disease, stage 3 (moderate): Secondary | ICD-10-CM | POA: Diagnosis not present

## 2016-03-23 DIAGNOSIS — E784 Other hyperlipidemia: Secondary | ICD-10-CM | POA: Diagnosis not present

## 2016-03-30 DIAGNOSIS — H269 Unspecified cataract: Secondary | ICD-10-CM | POA: Diagnosis not present

## 2016-03-30 DIAGNOSIS — M5416 Radiculopathy, lumbar region: Secondary | ICD-10-CM | POA: Diagnosis not present

## 2016-03-30 DIAGNOSIS — Z0000 Encounter for general adult medical examination without abnormal findings: Secondary | ICD-10-CM | POA: Diagnosis not present

## 2016-03-30 DIAGNOSIS — Z6823 Body mass index (BMI) 23.0-23.9, adult: Secondary | ICD-10-CM | POA: Diagnosis not present

## 2016-03-30 DIAGNOSIS — E7800 Pure hypercholesterolemia, unspecified: Secondary | ICD-10-CM | POA: Diagnosis not present

## 2016-03-30 DIAGNOSIS — Z1389 Encounter for screening for other disorder: Secondary | ICD-10-CM | POA: Diagnosis not present

## 2016-03-30 DIAGNOSIS — M859 Disorder of bone density and structure, unspecified: Secondary | ICD-10-CM | POA: Diagnosis not present

## 2016-03-30 DIAGNOSIS — G4700 Insomnia, unspecified: Secondary | ICD-10-CM | POA: Diagnosis not present

## 2016-03-30 DIAGNOSIS — N183 Chronic kidney disease, stage 3 (moderate): Secondary | ICD-10-CM | POA: Diagnosis not present

## 2016-03-30 DIAGNOSIS — C50919 Malignant neoplasm of unspecified site of unspecified female breast: Secondary | ICD-10-CM | POA: Diagnosis not present

## 2016-04-26 DIAGNOSIS — M79652 Pain in left thigh: Secondary | ICD-10-CM | POA: Diagnosis not present

## 2016-04-26 DIAGNOSIS — M47816 Spondylosis without myelopathy or radiculopathy, lumbar region: Secondary | ICD-10-CM | POA: Diagnosis not present

## 2016-04-26 DIAGNOSIS — M545 Low back pain: Secondary | ICD-10-CM | POA: Diagnosis not present

## 2016-05-10 DIAGNOSIS — H01002 Unspecified blepharitis right lower eyelid: Secondary | ICD-10-CM | POA: Diagnosis not present

## 2016-05-10 DIAGNOSIS — H01004 Unspecified blepharitis left upper eyelid: Secondary | ICD-10-CM | POA: Diagnosis not present

## 2016-05-11 DIAGNOSIS — M47816 Spondylosis without myelopathy or radiculopathy, lumbar region: Secondary | ICD-10-CM | POA: Diagnosis not present

## 2016-05-11 DIAGNOSIS — M79652 Pain in left thigh: Secondary | ICD-10-CM | POA: Diagnosis not present

## 2016-05-11 DIAGNOSIS — G894 Chronic pain syndrome: Secondary | ICD-10-CM | POA: Diagnosis not present

## 2016-05-31 DIAGNOSIS — H01001 Unspecified blepharitis right upper eyelid: Secondary | ICD-10-CM | POA: Diagnosis not present

## 2016-05-31 DIAGNOSIS — H10413 Chronic giant papillary conjunctivitis, bilateral: Secondary | ICD-10-CM | POA: Diagnosis not present

## 2016-05-31 DIAGNOSIS — H01004 Unspecified blepharitis left upper eyelid: Secondary | ICD-10-CM | POA: Diagnosis not present

## 2016-05-31 DIAGNOSIS — H01002 Unspecified blepharitis right lower eyelid: Secondary | ICD-10-CM | POA: Diagnosis not present

## 2016-06-23 DIAGNOSIS — H01004 Unspecified blepharitis left upper eyelid: Secondary | ICD-10-CM | POA: Diagnosis not present

## 2016-06-23 DIAGNOSIS — H01001 Unspecified blepharitis right upper eyelid: Secondary | ICD-10-CM | POA: Diagnosis not present

## 2016-06-23 DIAGNOSIS — H01005 Unspecified blepharitis left lower eyelid: Secondary | ICD-10-CM | POA: Diagnosis not present

## 2016-06-23 DIAGNOSIS — H01002 Unspecified blepharitis right lower eyelid: Secondary | ICD-10-CM | POA: Diagnosis not present

## 2016-08-05 DIAGNOSIS — D1801 Hemangioma of skin and subcutaneous tissue: Secondary | ICD-10-CM | POA: Diagnosis not present

## 2016-08-05 DIAGNOSIS — L739 Follicular disorder, unspecified: Secondary | ICD-10-CM | POA: Diagnosis not present

## 2016-08-05 DIAGNOSIS — Z85828 Personal history of other malignant neoplasm of skin: Secondary | ICD-10-CM | POA: Diagnosis not present

## 2016-08-05 DIAGNOSIS — L821 Other seborrheic keratosis: Secondary | ICD-10-CM | POA: Diagnosis not present

## 2016-08-05 DIAGNOSIS — L570 Actinic keratosis: Secondary | ICD-10-CM | POA: Diagnosis not present

## 2016-08-05 DIAGNOSIS — L814 Other melanin hyperpigmentation: Secondary | ICD-10-CM | POA: Diagnosis not present

## 2016-08-17 DIAGNOSIS — G894 Chronic pain syndrome: Secondary | ICD-10-CM | POA: Diagnosis not present

## 2016-08-17 DIAGNOSIS — M47816 Spondylosis without myelopathy or radiculopathy, lumbar region: Secondary | ICD-10-CM | POA: Diagnosis not present

## 2016-08-17 DIAGNOSIS — M545 Low back pain: Secondary | ICD-10-CM | POA: Diagnosis not present

## 2016-08-17 DIAGNOSIS — M79652 Pain in left thigh: Secondary | ICD-10-CM | POA: Diagnosis not present

## 2016-09-13 ENCOUNTER — Telehealth: Payer: Self-pay

## 2016-09-26 ENCOUNTER — Other Ambulatory Visit: Payer: Self-pay | Admitting: Hematology and Oncology

## 2016-09-26 DIAGNOSIS — Z853 Personal history of malignant neoplasm of breast: Secondary | ICD-10-CM

## 2016-10-01 DIAGNOSIS — Z23 Encounter for immunization: Secondary | ICD-10-CM | POA: Diagnosis not present

## 2016-10-13 ENCOUNTER — Ambulatory Visit: Payer: Medicare Other | Admitting: Hematology and Oncology

## 2016-10-14 ENCOUNTER — Ambulatory Visit: Payer: Medicare Other | Admitting: Hematology and Oncology

## 2016-10-17 ENCOUNTER — Ambulatory Visit (HOSPITAL_BASED_OUTPATIENT_CLINIC_OR_DEPARTMENT_OTHER): Payer: Medicare Other | Admitting: Hematology and Oncology

## 2016-10-17 ENCOUNTER — Encounter: Payer: Self-pay | Admitting: Hematology and Oncology

## 2016-10-17 DIAGNOSIS — Z170 Estrogen receptor positive status [ER+]: Secondary | ICD-10-CM

## 2016-10-17 DIAGNOSIS — C50211 Malignant neoplasm of upper-inner quadrant of right female breast: Secondary | ICD-10-CM

## 2016-10-17 DIAGNOSIS — M810 Age-related osteoporosis without current pathological fracture: Secondary | ICD-10-CM | POA: Diagnosis not present

## 2016-10-17 MED ORDER — LORATADINE 10 MG PO CAPS: 10.0000 mg | ORAL_CAPSULE | Freq: Every day | ORAL | Status: DC

## 2016-10-17 MED ORDER — GLUCOSAMINE SULFATE 1000 MG PO TABS: 1000.0000 mg | ORAL_TABLET | Freq: Every day | ORAL | 0 refills | Status: DC

## 2016-10-17 MED ORDER — CVS SUPER B COMPLEX/C PO TABS: 1.0000 | ORAL_TABLET | Freq: Every day | ORAL | 0 refills | Status: DC

## 2016-10-17 MED ORDER — DIPHENHYDRAMINE HCL (SLEEP) 25 MG PO TBDP: 25.0000 mg | ORAL_TABLET | Freq: Every day | ORAL | Status: DC

## 2016-10-17 MED ORDER — VITAMIN C ER 1000 MG PO TBCR: 1000.0000 mg | EXTENDED_RELEASE_TABLET | Freq: Every day | ORAL | 0 refills | Status: DC

## 2016-10-17 NOTE — Progress Notes (Signed)
Patient Care Team: Haywood Pao, MD as PCP - General (Internal Medicine) Marcy Panning, MD as Consulting Physician (Oncology)  DIAGNOSIS:  Encounter Diagnosis  Name Primary?  . Malignant neoplasm of upper-inner quadrant of right breast in female, estrogen receptor positive ([place])     SUMMARY OF ONCOLOGIC HISTORY:   Breast cancer of upper-inner quadrant of right female breast ([place])   05/08/2013 Initial Biopsy    Right: grade 1-2 invasive mammary carcinoma ER positive PR positive HER-2/neu negative with Ki-67 30% (MRI 2.8 cm)second smaller mass together 3.8cm      05/11/2013 - 11/04/2013 Anti-estrogen oral therapy    Letrozole 2.5 mg Neoadjuvant anti-estrogen therapy      11/06/2013 Surgery    Bilateral Lumpectomies: Left: sclerosing lesion with ALH fibrocystic changes with microcalcifications. Right: Grade 1 ILC 2.7 cm with LCIS       Radiation Therapy    Patient declined      11/19/2013 - 06/09/2015 Anti-estrogen oral therapy    Letrozole 2.5 mg (stopped for arthralgias and myalgias and fatigue accompanied with hair loss)       CHIEF COMPLIANT: Surveillance of breast cancer  INTERVAL HISTORY: [name] French is a 82 year old with above-mentioned history of right breast cancer underwent bilateral lumpectomies she declined radiation therapy and she did not continue with letrozole after June 2016. She is doing quite well. She missed an independent living facility. She exercises regularly and takes part in all recreational activities. She is in excellent physical health. She enjoys playing bridge with her friends.  REVIEW OF SYSTEMS:   Constitutional: Denies fevers, chills or abnormal weight loss Eyes: Denies blurriness of vision Ears, nose, mouth, throat, and face: Denies mucositis or sore throat Respiratory: Denies cough, dyspnea or wheezes Cardiovascular: Denies palpitation, chest discomfort Gastrointestinal:  Denies nausea, heartburn or change in bowel habits Skin:  Denies abnormal skin rashes Lymphatics: Denies new lymphadenopathy or easy bruising Neurological:Denies numbness, tingling or new weaknesses Behavioral/Psych: Mood is stable, no new changes  Extremities: No lower extremity edema Breast:  denies any pain or lumps or nodules in either breasts All other systems were reviewed with the patient and are negative.  I have reviewed the past medical history, past surgical history, social history and family history with the patient and they are unchanged from previous note.  ALLERGIES:  is allergic to hydrocodone; codeine; and neosporin [neomycin-bacitracin zn-polymyx].  MEDICATIONS:  Current Outpatient Prescriptions  Medication Sig Dispense Refill  . alendronate (FOSAMAX) 70 MG tablet     . aspirin 81 MG tablet Take 81 mg by mouth daily.    . Cholecalciferol (VITAMIN D PO) Take 1 tablet by mouth 2 (two) times daily.     . Cholecalciferol (VITAMIN D-3) 1000 UNITS CAPS Take 1 capsule by mouth daily.    . Doxylamine Succinate, Sleep, (UNISOM PO) Take by mouth at bedtime.    Marland Kitchen GLUCOSAMINE-CHONDROITIN PO Take 1 tablet by mouth daily.     Marland Kitchen letrozole (FEMARA) 2.5 MG tablet Take 1 tablet (2.5 mg total) by mouth daily. 90 tablet 3  . Loperamide HCl (IMODIUM PO) Take 1 tablet by mouth as needed.      . pravastatin (PRAVACHOL) 40 MG tablet     . Probiotic Product (ALIGN PO) Take 1 tablet by mouth daily.      Marland Kitchen zolpidem (AMBIEN) 5 MG tablet   2   No current facility-administered medications for this visit.     PHYSICAL EXAMINATION: ECOG PERFORMANCE STATUS: 0 - Asymptomatic  Vitals:  10/17/16 1032  BP: (!) 135/55  Pulse: 70  Resp: 18  Temp: 97.7 F (36.5 C)   Filed Weights   10/17/16 1032  Weight: 144 lb 4.8 oz (65.5 kg)    GENERAL:alert, no distress and comfortable SKIN: skin color, texture, turgor are normal, no rashes or significant lesions EYES: normal, Conjunctiva are pink and non-injected, sclera clear OROPHARYNX:no exudate, no  erythema and lips, buccal mucosa, and tongue normal  NECK: supple, thyroid normal size, non-tender, without nodularity LYMPH:  no palpable lymphadenopathy in the cervical, axillary or inguinal LUNGS: clear to auscultation and percussion with normal breathing effort HEART: regular rate & rhythm and no murmurs and no lower extremity edema ABDOMEN:abdomen soft, non-tender and normal bowel sounds MUSCULOSKELETAL:no cyanosis of digits and no clubbing  NEURO: alert & oriented x 3 with fluent speech, no focal motor/sensory deficits EXTREMITIES: No lower extremity edema BREAST: No palpable masses or nodules in either right or left breasts. No palpable axillary supraclavicular or infraclavicular adenopathy no breast tenderness or nipple discharge. (exam performed in the presence of a chaperone)  LABORATORY DATA:  I have reviewed the data as listed   Chemistry      Component Value Date/Time   NA 141 04/13/2015 0946   K 4.3 04/13/2015 0946   CL 104 06/10/2013 1246   CO2 25 04/13/2015 0946   BUN 12.9 04/13/2015 0946   CREATININE 0.8 04/13/2015 0946      Component Value Date/Time   CALCIUM 9.5 04/13/2015 0946   ALKPHOS 54 04/13/2015 0946   AST 11 04/13/2015 0946   ALT 10 04/13/2015 0946   BILITOT 0.53 04/13/2015 0946       Lab Results  Component Value Date   WBC 5.4 04/13/2015   HGB 14.4 04/13/2015   HCT 44.2 04/13/2015   MCV 90.2 04/13/2015   PLT 227 04/13/2015   NEUTROABS 2.9 04/13/2015     ASSESSMENT & PLAN:  Breast cancer of upper-inner quadrant of right female breast stage II (T2 Nx) invasive lobular carcinoma of the right breast status post neoadjuvant antiestrogen therapy initially for 7 months followed by bilateral lumpectomies. On the right patient had a 2.7 cm residual disease that was grade 1. On the left she only had atypical lobular hyperplasia and sclerosing lesion with lobular neoplasia. The patient did not receive adjuvant radiation therapy. She however has been  continued on adjuvant antiestrogen therapy with letrozole 2.5 mg daily until 10/14/2015 because of myalgias.   Osteoporosis: She stopped Fosamax under the care of her PCP  Breast Cancer Surveillance:  1. Breast exam 10/17/2016 Normal  2. Mammogram 11/03/2015 No abnormalities. Postsurgical changes. Breast Density Category C. I recommended that she get 3-D mammograms for surveillance.  Discussed the differences between different breast density categories.   RTC 1 year   No orders of the defined types were placed in this encounter.  The patient has a good understanding of the overall plan. she agrees with it. she will call with any problems that may develop before the next visit here.   Rulon Eisenmenger, MD 10/17/16

## 2016-10-17 NOTE — Assessment & Plan Note (Signed)
stage II (T2 Nx) invasive lobular carcinoma of the right breast status post neoadjuvant antiestrogen therapy initially for 7 months followed by bilateral lumpectomies. On the right patient had a 2.7 cm residual disease that was grade 1. On the left she only had atypical lobular hyperplasia and sclerosing lesion with lobular neoplasia. The patient did not receive adjuvant radiation therapy. She however has been continued on adjuvant antiestrogen therapy with letrozole 2.5 mg daily until 10/14/2015 because of myalgias.   Osteoporosis: Loss of bone density: I do not have her bone density results but she was started back on Fosamax with calcium and vitamin D by her primary care physician. I encouraged her to continue on this medication.   Breast Cancer Surveillance:  1. Breast exam 10/17/2016 Normal  2. Mammogram 11/03/2015 No abnormalities. Postsurgical changes. Breast Density Category C. I recommended that she get 3-D mammograms for surveillance.  Discussed the differences between different breast density categories.   RTC 1 year

## 2016-10-21 DIAGNOSIS — H26491 Other secondary cataract, right eye: Secondary | ICD-10-CM | POA: Diagnosis not present

## 2016-11-03 ENCOUNTER — Ambulatory Visit
Admission: RE | Admit: 2016-11-03 | Discharge: 2016-11-03 | Disposition: A | Discharge status: Home / Self Care | Payer: Medicare Other | Source: Ambulatory Visit | Attending: Hematology and Oncology | Admitting: Hematology and Oncology

## 2016-11-03 DIAGNOSIS — Z853 Personal history of malignant neoplasm of breast: Secondary | ICD-10-CM

## 2016-11-03 DIAGNOSIS — R928 Other abnormal and inconclusive findings on diagnostic imaging of breast: Secondary | ICD-10-CM | POA: Diagnosis not present

## 2016-11-16 DIAGNOSIS — H26491 Other secondary cataract, right eye: Secondary | ICD-10-CM | POA: Diagnosis not present

## 2017-01-12 ENCOUNTER — Encounter (INDEPENDENT_AMBULATORY_CARE_PROVIDER_SITE_OTHER): Payer: Self-pay | Admitting: Physical Medicine and Rehabilitation

## 2017-01-12 ENCOUNTER — Ambulatory Visit (INDEPENDENT_AMBULATORY_CARE_PROVIDER_SITE_OTHER): Payer: Medicare Other

## 2017-01-12 ENCOUNTER — Ambulatory Visit (INDEPENDENT_AMBULATORY_CARE_PROVIDER_SITE_OTHER): Payer: Medicare Other | Admitting: Physical Medicine and Rehabilitation

## 2017-01-12 VITALS — BP 132/68 | HR 81

## 2017-01-12 DIAGNOSIS — M47816 Spondylosis without myelopathy or radiculopathy, lumbar region: Secondary | ICD-10-CM | POA: Diagnosis not present

## 2017-01-12 DIAGNOSIS — M419 Scoliosis, unspecified: Secondary | ICD-10-CM

## 2017-01-12 DIAGNOSIS — M25552 Pain in left hip: Secondary | ICD-10-CM

## 2017-01-12 DIAGNOSIS — M1612 Unilateral primary osteoarthritis, left hip: Secondary | ICD-10-CM | POA: Diagnosis not present

## 2017-01-12 NOTE — Progress Notes (Signed)
Faxed to Dr. Unice Bailey office at [phone].

## 2017-01-12 NOTE — Progress Notes (Signed)
LAUREE YURICK - 83 y.o. female MRN [id number]  Date of birth: 1926-09-17  Office Visit Note: Visit Date: 01/12/2017 PCP: Haywood Pao, MD Referred by: Haywood Pao, MD  Subjective: Chief Complaint  Patient presents with  . Left Thigh - Pain   HPI: Mrs. Quentin Cornwall is an 83 year old female who appears younger than her stated age and comes in today with complaints of chronic worsening severe left hip and thigh pain. She really does not report much in the way of back pain. She says first thing in the morning and first thing getting up from a seated position is difficult for her to bend over. This is forward flexion. She also shows me where she kind depends on how high she tries to flex her hip and she gets pain. No specific groin pain but her pain is a lady and is more anterior lateral. It stops at about the knee. There is no tingling or numbness. She says that when she gets going her gets walking she feels much better. She was given a prescription of Voltaren gel has been using that really just on her thigh twice a day and it seems like it helps a little bit. She has been followed by Dr. Niel Hummer. She originally went to a chiropractor year so ago with back pain and the chiropractor referred her to Dr. Niel Hummer. The patient evidently has received facet joint blocks. We do have one page of her notes showing left-sided facet joint blocks at L3-4, L4-5 and L5-S1. The patient states today that the last injection she had didn't help very much at all. She states that the first when she had this seemed to help quite a bit. She unfortunately is unable to see Dr. Niel Hummer because of having Medicare. She reports no specific trauma or falls. No focal weakness or paresthesias in the legs or feet. She's had no bowel or bladder difficulties. She's had no fever chills or night sweats or unexplained weight loss. No prior lumbar surgery. She does reside at [facility].    Left thigh pain. Pain  doesn't really go into back. Started Voltaren gel- might have helped a little. Pain is better once she "gets going." Difficult to bend over. Last injection "very ineffective."  Review of Systems  Constitutional: Negative for chills, fever, malaise/fatigue and weight loss.  HENT: Negative for hearing loss and sinus pain.   Eyes: Negative for blurred vision, double vision and photophobia.  Respiratory: Negative for cough and shortness of breath.   Cardiovascular: Negative for chest pain, palpitations and leg swelling.  Gastrointestinal: Negative for abdominal pain, nausea and vomiting.  Genitourinary: Negative for flank pain.  Musculoskeletal: Positive for joint pain (Left thigh pain). Negative for myalgias.  Skin: Negative for itching and rash.  Neurological: Negative for tingling, tremors, focal weakness and weakness.  Endo/Heme/Allergies: Negative.   Psychiatric/Behavioral: Negative for depression.  All other systems reviewed and are negative.  Otherwise per HPI.  Assessment & Plan: Visit Diagnoses:  1. Pain in left hip   2. Unilateral primary osteoarthritis, left hip   3. Scoliosis of thoracolumbar spine, unspecified scoliosis type   4. Spondylosis without myelopathy or radiculopathy, lumbar region     Plan: Findings:  Chronic history of left-sided low back pain with 2 significant exacerbations with ultimate relief with facet joint block by Dr. Niel Hummer. She was referred here for possible facet joint blocks given that Dr. Phineas Semen does not take Medicare now. On exam today she has clear signs of systems more  of a hip problem. She has pretty severe arthritis of the left hip. Exam shows pain with internal rotation and flexion. She has decreased range of motion. She has some decreased range of motion on the right as well. She has some pain with extension of the low back but is not concordant with her main complaint. At this point I want to schedule her for a diagnostic and hopefully  therapeutic anesthetic left hip arthrogram. I also want to schedule her for physical therapy of the low back and hip for range of motion as well as posture and core strengthening. We are going to make a referral to [facility] outpatient therapy which is close to where she lives. She ultimately could continue some of the therapy exercises at [facility] as they do have a physical therapy unit.  Depending on the level of pain and/or the level of relief she gets with the injection and therapy even at 89 she would be a good candidate for anterior hip replacement if it came to that. I spent more than 30 minutes speaking face-to-face with the patient with 50% of the time in counseling.    Meds & Orders: No orders of the defined types were placed in this encounter.   Orders Placed This Encounter  Procedures  . XR HIP UNILAT W OR W/O PELVIS 1V LEFT  . XR Lumbar Spine 2-3 Views  . Ambulatory referral to Physical Therapy    Follow-up: Return for Scheduled left hip anesthetic arthrogram..   Procedures: No procedures performed  No notes on file   Clinical History: No specialty comments available.  She reports that she quit smoking about 38 years ago. Her smoking use included Cigarettes. She has a 12.50 pack-year smoking history. She has never used smokeless tobacco. No results for input(s): HGBA1C, LABURIC in the last 8760 hours.  Objective:  VS:  HT:    WT:   BMI:     BP:132/68  HR:81bpm  TEMP: ( )  RESP:  Physical Exam  Constitutional: She is oriented to person, place, and time. She appears well-developed and well-nourished. No distress.  HENT:  Head: Normocephalic and atraumatic.  Nose: Nose normal.  Mouth/Throat: Oropharynx is clear and moist.  Eyes: Conjunctivae are normal. Pupils are equal, round, and reactive to light.  Neck: Normal range of motion. Neck supple.  Cardiovascular: Regular rhythm and intact distal pulses.   Pulmonary/Chest: Effort normal and breath sounds  normal.  Abdominal: She exhibits no distension.  Musculoskeletal:  Ambulates without aid. She is slow to rise from a seated position. She has hip pain with internal Rotation of the left hip that does refer to 5 but not specifically the groin. Is not severe pain. Her right hip lacks some degree of motion but is not painful. She has good distal strength without deficit. She has no clonus bilaterally.  Neurological: She is alert and oriented to person, place, and time. She displays normal reflexes. She exhibits normal muscle tone. Coordination normal.  Skin: Skin is warm. No rash noted. No erythema.  Psychiatric: She has a normal mood and affect. Her behavior is normal.  Nursing note and vitals reviewed.   Ortho Exam Imaging: Xr Hip Unilat W Or W/o Pelvis 1v Left  Result Date: 01/12/2017 AP of the pelvis shows moderate arthritic changes of the right with still preserved joint spacing. Left hip shows fairly severe osteoarthritis with superior spurring joint space loss as well as some cystic structures in the acetabulum. There are no fractures.  Xr Lumbar Spine 2-3 Views  Result Date: 01/12/2017 AP and lateral the lumbar spine show mild scoliosis rightward centered about L2 and L3. There is well-maintained sacroiliac joint spaces without sclerosis. There is some high differential of the pelvis but small. There are no fractures or dislocations there's clear osteoarthritis of the lumbar spine particularly at L4-5 and L5-S1. On the lateral view there is normal anatomic alignment with good lordosis in good disc spacing. There is no listhesis. There is some degenerative endplate spurring at multiple levels.   Past Medical/Family/Surgical/Social History: Medications & Allergies reviewed per EMR Patient Active Problem List   Diagnosis Date Noted  . Breast cancer of upper-inner quadrant of right female breast ([place]) 06/10/2013  . Mass of breast, left 05/31/2013  . PERSONAL HISTORY OF COLONIC POLYPS  07/26/2010   Past Medical History:  Diagnosis Date  . Allergic rhinitis   . Arthritis   . Breast cancer ([place]) 05/08/13   right upper inner, invasive mammary  . Breast cancer, right breast ([place]) 04/16/2013   Underwent lumpectomy on 11/06/13. Path showed G1 ILC, 2.7 cm, neg margins, receptor+, Her2neg   . Diverticulosis   . Hemorrhoids   . Hx of adenomatous colonic polyps 05/2002  . Osteoporosis   . Rosacea   . 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 Use of letrozole (Femara)    neoadjuvant antiestrogen therapy with letrozole 2.5 mg daily x 7 monhts   Family History  Problem Relation Age of Onset  . Heart disease Mother   . Prostate cancer Father   . Lung cancer Sister    Past Surgical History:  Procedure Laterality Date  . BREAST BIOPSY Left 1960   lt br bx/benign  . BREAST LUMPECTOMY WITH NEEDLE LOCALIZATION Bilateral 11/06/2013   Procedure: BREAST LUMPECTOMY WITH NEEDLE LOCALIZATION;  Surgeon: Haywood Lasso, MD;  Location: [facility];  Service: General;  Laterality: Bilateral;  . COLONOSCOPY    . EYE SURGERY     both cataracts  . MOHS SURGERY Right    nose basal/squamous  . WRIST SURGERY  1990   lt   Social History   Occupational History  . Retired    Social History Main Topics  . Smoking status: Former Smoker    Packs/day: 0.50    Years: 25.00    Types: Cigarettes    Quit date: 12/19/1978  . Smokeless tobacco: Never Used  . Alcohol use Yes     Comment: rare- wine 2-3 times weekly  . Drug use: No  . Sexual activity: Not Currently     Comment: menarche at age14, menopause in 39, HRT x 47, age at first live birth 34's, G42 P3

## 2017-01-16 ENCOUNTER — Ambulatory Visit (INDEPENDENT_AMBULATORY_CARE_PROVIDER_SITE_OTHER): Payer: Self-pay

## 2017-01-16 ENCOUNTER — Encounter (INDEPENDENT_AMBULATORY_CARE_PROVIDER_SITE_OTHER): Payer: Self-pay | Admitting: Physical Medicine and Rehabilitation

## 2017-01-16 ENCOUNTER — Ambulatory Visit (INDEPENDENT_AMBULATORY_CARE_PROVIDER_SITE_OTHER): Payer: Medicare Other | Admitting: Physical Medicine and Rehabilitation

## 2017-01-16 VITALS — BP 116/56 | HR 81

## 2017-01-16 DIAGNOSIS — M25551 Pain in right hip: Secondary | ICD-10-CM

## 2017-01-16 DIAGNOSIS — M25552 Pain in left hip: Secondary | ICD-10-CM

## 2017-01-16 NOTE — Patient Instructions (Signed)

## 2017-01-16 NOTE — Progress Notes (Addendum)
[name] French - 83 y.o. female MRN [id number]  Date of birth: June 22, 1926  Office Visit Note: Visit Date: 01/16/2017 PCP: Haywood Pao, MD Referred by: Haywood Pao, MD  Subjective: Chief Complaint  Patient presents with  . Left Hip - Pain   HPI: [name] is an 83 year old female here today for planned diagnostic and hopefully therapeutic left hip anesthetic arthrogram. No change in symptoms. Please see our prior evaluation and management note for further details and justification.    ROS Otherwise per HPI.  Assessment & Plan: Visit Diagnoses:  1. Pain in joint of left hip     Plan: Findings:  Left hip anesthetic arthrogram performed today for left hip and thigh pain with noted arthritis on x-ray. Patient did get relief during the anesthetic portion of the injection. See procedure details.    Meds & Orders:  Meds ordered this encounter  Medications  . lidocaine (XYLOCAINE) 2 % (with pres) injection 4 mL  . triamcinolone acetonide (KENALOG-40) injection 80 mg    Orders Placed This Encounter  Procedures  . Large Joint Injection/Arthrocentesis  . XR C-ARM NO REPORT    Follow-up: Return if symptoms worsen or fail to improve.   Procedures: Large Joint Inj Date/Time: 01/16/2017 9:33 AM Performed by: Magnus Sinning Authorized by: Magnus Sinning   Consent Given by:  Patient Site marked: the procedure site was marked   Timeout: prior to procedure the correct patient, procedure, and site was verified   Indications:  Pain and diagnostic evaluation Location:  Hip Prep: patient was prepped and draped in usual sterile fashion   Needle Size:  22 G Approach:  Anterior Ultrasound Guidance: No   Fluoroscopic Guidance: No   Arthrogram: Yes   Medications:  4 mL lidocaine 2 %; 80 mg triamcinolone acetonide 40 MG/ML Aspiration Attempted: Yes   Patient tolerance:  Patient tolerated the procedure well with no immediate complications  Arthrogram demonstrated  excellent flow of contrast throughout the joint surface without extravasation or obvious defect.  The patient had relief of symptoms during the anesthetic phase of the injection.     No notes on file   Clinical History: No specialty comments available.  She reports that she quit smoking about 38 years ago. Her smoking use included Cigarettes. She has a 12.50 pack-year smoking history. She has never used smokeless tobacco. No results for input(s): HGBA1C, LABURIC in the last 8760 hours.  Objective:  VS:  HT:    WT:   BMI:     BP:(!) 116/56  HR:81bpm  TEMP: ( )  RESP:  Physical Exam  Musculoskeletal:  Patient ambulates without a slight antalgic gait to the left. Decreased range of motion with reproduction of pain with internal rotation.    Ortho Exam Imaging: No results found.  Past Medical/Family/Surgical/Social History: Medications & Allergies reviewed per EMR Patient Active Problem List   Diagnosis Date Noted  . Breast cancer of upper-inner quadrant of right female breast ([place]) 06/10/2013  . Mass of breast, left 05/31/2013  . PERSONAL HISTORY OF COLONIC POLYPS 07/26/2010   Past Medical History:  Diagnosis Date  . Allergic rhinitis   . Arthritis   . Breast cancer ([place]) 05/08/13   right upper inner, invasive mammary  . Breast cancer, right breast ([place]) 04/16/2013   Underwent lumpectomy on 11/06/13. Path showed G1 ILC, 2.7 cm, neg margins, receptor+, Her2neg   . Diverticulosis   . Hemorrhoids   . Hx of adenomatous colonic polyps 05/2002  . Osteoporosis   .  Rosacea   . 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 Use of letrozole ([place]em[place])    neoadjuvant antiestrogen therapy with letrozole 2.5 mg daily x 7 monhts   Family History  Problem Relation Age of Onset  . Heart disease Mother   . Prostate cancer Father   . Lung cancer Sister    Past Surgical History:  Procedure Laterality Date  . BREAST BIOPSY Left 1960   lt br bx/benign  . BREAST LUMPECTOMY WITH NEEDLE  LOCALIZATION Bilateral 11/06/2013   Procedure: BREAST LUMPECTOMY WITH NEEDLE LOCALIZATION;  Surgeon: Haywood Lasso, MD;  Location: [facility];  Service: General;  Laterality: Bilateral;  . COLONOSCOPY    . EYE SURGERY     both cataracts  . MOHS SURGERY Right    nose basal/squamous  . WRIST SURGERY  1990   lt   Social History   Occupational History  . Retired    Social History Main Topics  . Smoking status: Former Smoker    Packs/day: 0.50    Years: 25.00    Types: Cigarettes    Quit date: 12/19/1978  . Smokeless tobacco: Never Used  . Alcohol use Yes     Comment: rare- wine 2-3 times weekly  . Drug use: No  . Sexual activity: Not Currently     Comment: menarche at age14, menopause in 8, HRT x 60, age at first live birth 3's, G64 P3

## 2017-01-17 MED ORDER — TRIAMCINOLONE ACETONIDE 40 MG/ML IJ SUSP
80.0000 mg | INTRAMUSCULAR | Status: AC | PRN
  Administered 2017-01-16: 80 mg via INTRA_ARTICULAR

## 2017-01-17 MED ORDER — LIDOCAINE HCL 2 % IJ SOLN
4.0000 mL | INTRAMUSCULAR | Status: AC | PRN
  Administered 2017-01-16: 4 mL

## 2017-01-27 ENCOUNTER — Encounter: Payer: Self-pay | Admitting: Physical Therapy

## 2017-01-27 ENCOUNTER — Ambulatory Visit: Payer: Medicare Other | Attending: Physical Medicine and Rehabilitation | Admitting: Physical Therapy

## 2017-01-27 DIAGNOSIS — M6281 Muscle weakness (generalized): Secondary | ICD-10-CM | POA: Diagnosis not present

## 2017-01-27 DIAGNOSIS — M79651 Pain in right thigh: Secondary | ICD-10-CM | POA: Diagnosis not present

## 2017-01-27 NOTE — Therapy (Signed)
[facility] [address] [place], [place], [address] Phone: [phone]   Fax:  [phone]  Physical Therapy Evaluation  Patient Details  Name: [name] French MRN: [id number] Date of Birth: 11/20/1926 Referring Provider: Dr. Ernestina Patches  Encounter Date: 01/27/2017      PT End of Session - 01/27/17 1203    Visit Number 1   Number of Visits 10   Date for PT Re-Evaluation 03/24/17   Authorization Type Medicare G codes   PT Start Time 1057   PT Stop Time 1145   PT Time Calculation (min) 48 min   Activity Tolerance Patient tolerated treatment well      Past Medical History:  Diagnosis Date  . Allergic rhinitis   . Arthritis   . Breast cancer ([place]) 05/08/13   right upper inner, invasive mammary  . Breast cancer, right breast ([place]) 04/16/2013   Underwent lumpectomy on 11/06/13. Path showed G1 ILC, 2.7 cm, neg margins, receptor+, Her2neg   . Diverticulosis   . Hemorrhoids   . Hx of adenomatous colonic polyps 05/2002  . Osteoporosis   . Rosacea   . 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 Use of letrozole (Femara)    neoadjuvant antiestrogen therapy with letrozole 2.5 mg daily x 7 monhts    Past Surgical History:  Procedure Laterality Date  . BREAST BIOPSY Left 1960   lt br bx/benign  . BREAST LUMPECTOMY WITH NEEDLE LOCALIZATION Bilateral 11/06/2013   Procedure: BREAST LUMPECTOMY WITH NEEDLE LOCALIZATION;  Surgeon: Haywood Lasso, MD;  Location: [facility];  Service: General;  Laterality: Bilateral;  . COLONOSCOPY    . EYE SURGERY     both cataracts  . MOHS SURGERY Right    nose basal/squamous  . WRIST SURGERY  1990   lt    There were no vitals filed for this visit.       Subjective Assessment - 01/27/17 1059    Subjective Left anterior thigh pain;  2 years ago woke up and could hardly walk,  no benefit from chiro;  had 2 spinal injections with improvement, no benefit from 3rd  injection;  had recent hip injection by Dr. Ernestina Patches which helped (end of Jan.).  Dr. suggested PT.   Going out of town for 2 weeks.  Difficulty lifting leg to put on hose or stockings.  Difficulty rising.     Pertinent History hx of breast CA;  lives at [facility];  osteopenia;  wrist fx (RSD)   Limitations Other (comment)   How long can you sit comfortably? no problem   How long can you walk comfortably? > 1 mile but hasn't tried   Diagnostic tests xrays    Patient Stated Goals move up and down without a lot of pain   Currently in Pain? Yes   Pain Score 0-No pain   Pain Location Leg   Pain Orientation Left   Pain Type Chronic pain   Pain Onset More than a month ago   Pain Frequency Intermittent   Aggravating Factors  rising; lifting knee high to put on stockings   Pain Relieving Factors hip injection;  Biofreeze;  Voltaren            [facility] PT Assessment - 01/27/17 0001      Assessment   Medical Diagnosis left hip pain;  scoliosis thoracolumbar pain   Referring Provider Dr. Ernestina Patches   Onset Date/Surgical Date --  2 years   Hand Dominance Right  Next MD Visit as needed   Prior Therapy PT when broke wrist had RSD     Precautions   Precautions None     Restrictions   Weight Bearing Restrictions No     Balance Screen   Has the patient fallen in the past 6 months No   Has the patient had a decrease in activity level because of a fear of falling?  No   Is the patient reluctant to leave their home because of a fear of falling?  No     Home [employer] residence   Living Arrangements --  [facility]   Type of [place] Level entry   [place] - single point   Additional Comments used cane initially when could hardly walk but not now     Prior Function   Vocation Retired   Leisure travel, play bridge, go to [employer]     Observation/Other Assessments   Focus on  Therapeutic Outcomes (FOTO)  34% limitation     ROM / Strength   AROM / PROM / Strength --  single leg standing 3-5 sec bilaterally;  pelvic drop on L     AROM   AROM Assessment Site Hip;Lumbar   Right/Left Hip Right;Left   Right Hip Extension 10   Right Hip Flexion 120   Right Hip External Rotation  30   Right Hip Internal Rotation  15   Left Hip Extension 5   Left Hip Flexion 105   Left Hip External Rotation  20   Left Hip Internal Rotation  5   Lumbar Extension 15   Lumbar - Right Side Bend 30   Lumbar - Left Side Bend 30     Strength   Strength Assessment Site Hip;Knee;Ankle  Able to rise from chair without UE use   Right Hip Flexion 5/5   Right Hip Extension 5/5   Right Hip ABduction 4+/5   Left Hip Flexion 4+/5   Left Hip Extension 4/5   Right/Left Knee Right;Left   Right Knee Flexion 5/5   Right Knee Extension 5/5   Left Knee Flexion 5/5   Left Knee Extension 4+/5   Right/Left Ankle Right;Left     Flexibility   Soft Tissue Assessment /Muscle Length yes   Hamstrings decreased left right 80, left 75   Quadriceps decreased left     Palpation   Palpation comment no tenderness     Special Tests   Lumbar Tests Slump Test;Straight Leg Raise     Slump test   Findings Negative     Straight Leg Raise   Findings Negative     Saralyn Pilar (FABER) Test   Findings Negative     Hip Scouring   Findings Positive     Timed Up and Go Test   Manual TUG (seconds) 8.8                             PT Short Term Goals - 01/27/17 1212      PT SHORT TERM GOAL #1   Title STGs=LTGs           PT Long Term Goals - 01/27/17 1212      PT LONG TERM GOAL #1   Title The patient will be independent in HEP for LE strengthening and ROM    03/24/17   Time 8  Period Weeks   Status New     PT LONG TERM GOAL #2   Title The patient will have improved  left hip flexion to 120 degrees for putting on stockings with greater ease   Time 8   Period Weeks    Status New     PT LONG TERM GOAL #3   Title The patient will report minimal to no pain with rising from a standard chair  the majority of the time   Time 8   Period Weeks   Status New     PT LONG TERM GOAL #4   Title Left hip abduction strength improved to 4+/5 needed for standing for longer periods of time without pelvic drop   Time 8   Period Weeks   Status New     PT LONG TERM GOAL #5   Title FOTO functional outcome score improved from 34% limitation to 30% indicating improved function with less pain   Time 8   Period Weeks   Status New               Plan - 01/27/17 1203    Clinical Impression Statement The patient complains of a 2 year history of left anterior thigh pain worsened or produced with rising with sit to stand and flexing her left hip to put on her stockings.  Recently she had a hip injection and she reports decreased pain and hip mobility since then.   X-rays showed degenerative changes in lumbar spine and OA in left hip.  She has decreased left hip flexion, extension, external and internal rotation ROM by 10-15 degrees compared to right.  Decreased hip flexor muscle length on left and mild limitation in HS length.  Decreased left > right hip abduction strength.  Negative lumbar neural tests.  Hip stiffness but no pain with hip scour and FABER tests.  The patient is of low complexity evaluation with minimal co-morbidities, stable status and good psychosocial support.  The patient is going to [place] until the end of the month.   She was instructed in initial HEP and will follow up in 03/23/23.     Rehab Potential Good   Clinical Impairments Affecting Rehab Potential osteopenia;  hx of breast CA   PT Frequency 1x / week   PT Duration 8 weeks   PT Treatment/Interventions ADLs/Self Care Home Management;Electrical Stimulation;Cryotherapy;Moist Heat;Neuromuscular re-education;Therapeutic exercise;Taping;Manual techniques;Patient/family education;Iontophoresis '4mg'$ /ml  Dexamethasone   PT Next Visit Plan Follow up in 23-Mar-2023 after her trip to [facility];  review supine hip flexor stretch, SKTC and clams;  add HS stretch, single leg balance;  ionto if needed and cert signed      Patient will benefit from skilled therapeutic intervention in order to improve the following deficits and impairments:  Pain, Decreased strength, Decreased range of motion  Visit Diagnosis: Pain in right thigh - Plan: PT plan of care cert/re-cert  Muscle weakness (generalized) - Plan: PT plan of care cert/re-cert      G-Codes - 88/50/27 March 23, 1215    Functional Assessment Tool Used FOTO; clincial judgement   Functional Limitation Mobility: Walking and moving around   Mobility: Walking and Moving Around Current Status [phone]) At least 20 percent but less than 40 percent impaired, limited or restricted   Mobility: Walking and Moving Around Goal Status [phone]) At least 1 percent but less than 20 percent impaired, limited or restricted       Problem List Patient Active Problem List   Diagnosis Date Noted  .  Breast cancer of upper-inner quadrant of right female breast ([place]) 06/10/2013  . Mass of breast, left 05/31/2013  . PERSONAL HISTORY OF COLONIC POLYPS 07/26/2010   Ruben Im, PT 01/27/17 12:19 PM Phone: [phone] Fax: [phone]  [name] 01/27/2017, 12:19 PM  [facility] [address], [place], [place], [address] Phone: [phone]   Fax:  [phone]  Name: SOMARA FRYMIRE MRN: [id number] Date of Birth: 04-Sep-1926

## 2017-01-27 NOTE — Patient Instructions (Signed)
   Abduction: Clam (Eccentric) - Side-Lying   Lie on side with knees bent, tipped forward.   Lift top knee, keeping feet together. Keep trunk steady. Slowly lower for 3-5 seconds. _10-15__ reps per set, __1_ sets per day, _7__ days per week.      Copyright  VHI. All rights reserved.    Ruben Im PT [facility] [address], [place], [place] [address] Phone # [phone] Fax [phone]

## 2017-02-21 ENCOUNTER — Ambulatory Visit: Payer: Medicare Other | Attending: Physical Medicine and Rehabilitation | Admitting: Physical Therapy

## 2017-02-21 DIAGNOSIS — M79651 Pain in right thigh: Secondary | ICD-10-CM | POA: Diagnosis not present

## 2017-02-21 DIAGNOSIS — M6281 Muscle weakness (generalized): Secondary | ICD-10-CM

## 2017-02-21 NOTE — Patient Instructions (Signed)
   Nu-Step Seat 8, arms 9  For  5 minutes      Ruben Im PT [facility] [address], [place], [place] [address] Phone # [phone] Fax [phone]

## 2017-02-21 NOTE — Therapy (Signed)
[facility] [address], [place], [place], [address] Phone: [phone]   Fax:  [phone]  Physical Therapy Treatment  Patient Details  Name: [name] French MRN: [id number] Date of Birth: Aug 03, 1926 Referring Provider: Dr. Ernestina French  Encounter Date: 02/21/2017      PT End of Session - 02/21/17 1844    Visit Number 2   Number of Visits 10   Date for PT Re-Evaluation 03/24/17   Authorization Type Medicare G codes   PT Start Time 0933   PT Stop Time 1013   PT Time Calculation (min) 40 min   Activity Tolerance Patient tolerated treatment well      Past Medical History:  Diagnosis Date  . Allergic rhinitis   . Arthritis   . Breast cancer ([place]) 05/08/13   right upper inner, invasive mammary  . Breast cancer, right breast ([place]) 04/16/2013   Underwent lumpectomy on 11/06/13. Path showed G1 ILC, 2.7 cm, neg margins, receptor+, Her2neg   . Diverticulosis   . Hemorrhoids   . Hx of adenomatous colonic polyps 05/2002  . Osteoporosis   . Rosacea   . 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 Use of letrozole (Femara)    neoadjuvant antiestrogen therapy with letrozole 2.5 mg daily x 7 monhts    Past Surgical History:  Procedure Laterality Date  . BREAST BIOPSY Left 1960   lt br bx/benign  . BREAST LUMPECTOMY WITH NEEDLE LOCALIZATION Bilateral 11/06/2013   Procedure: BREAST LUMPECTOMY WITH NEEDLE LOCALIZATION;  Surgeon: [name] Lasso, MD;  Location: [facility];  Service: General;  Laterality: Bilateral;  . COLONOSCOPY    . EYE SURGERY     both cataracts  . MOHS SURGERY Right    nose basal/squamous  . WRIST SURGERY  1990   lt    There were no vitals filed for this visit.      Subjective Assessment - 02/21/17 0937    Subjective Returned from [facility].  Did some pool ex but states she wasn't especially compliant with exercises.  I am better.  Greater ease with rising and lifting leg to put on  socks.  Some left anterior thigh discomfort with walking.     Currently in Pain? Yes   Pain Score 1    Pain Orientation Left   Pain Type Chronic pain   Pain Onset More than a month ago   Pain Frequency Intermittent   Aggravating Factors  walking                         [facility] Adult PT Treatment/Exercise - 02/21/17 0001      Therapeutic Activites    Therapeutic Activities ADL's   ADL's sit to stand, standing, walking     Knee/Hip Exercises: Stretches   Hip Flexor Stretch Left;3 reps;30 seconds   Hip Flexor Stretch Limitations supine   Other Knee/Hip Stretches psoas doorway stretch with UE movements 3x5 right /left   Other Knee/Hip Stretches SKTC left 3x 30 sec     Knee/Hip Exercises: Aerobic   Nustep L1 6 min     Knee/Hip Exercises: Seated   Sit to Sand 10 reps;without UE support  from high table     Knee/Hip Exercises: Sidelying   Clams 10x                  PT Short Term Goals - 02/21/17 1848      PT SHORT TERM GOAL #  1   Title STGs=LTGs           PT Long Term Goals - 02/21/17 1849      PT LONG TERM GOAL #1   Title The patient will be independent in HEP for LE strengthening and ROM    03/24/17   Time 8   Period Weeks   Status On-going     PT LONG TERM GOAL #2   Title The patient will have improved  left hip flexion to 120 degrees for putting on stockings with greater ease   Time 8   Period Weeks   Status On-going     PT LONG TERM GOAL #3   Title The patient will report minimal to no pain with rising from a standard chair  the majority of the time   Time 8   Period Weeks   Status On-going     PT LONG TERM GOAL #4   Title Left hip abduction strength improved to 4+/5 needed for standing for longer periods of time without pelvic drop   Time 8   Period Weeks   Status On-going     PT LONG TERM GOAL #5   Title FOTO functional outcome score improved from 34% limitation to 30% indicating improved function with less pain   Time 8    Period Weeks   Status On-going               Plan - 02/21/17 1844    Clinical Impression Statement The patient returns after trip to [place].  She reports she is feeling better with greater ease with sit to stand.  She has decreased bilateral hip flexor muscle lengths.  She reports muscular thigh discomfort during exercises which dissipates quickly.   The patient states that since her pain has improved, she would like to continue her HEP for 2 weeks then follow up.     PT Next Visit Plan   add HS stretch, single leg balance;  gluteal and quad exs;   ionto if needed      Patient will benefit from skilled therapeutic intervention in order to improve the following deficits and impairments:     Visit Diagnosis: Pain in right thigh  Muscle weakness (generalized)     Problem List Patient Active Problem List   Diagnosis Date Noted  . Breast cancer of upper-inner quadrant of right female breast ([place]) 06/10/2013  . Mass of breast, left 05/31/2013  . PERSONAL HISTORY OF COLONIC POLYPS 07/26/2010   [name] French, PT 02/21/17 6:50 PM Phone: [phone] Fax: [phone]  [name] French 02/21/2017, 6:50 PM  [facility] [address] [place], [place], [address] Phone: [phone]   Fax:  [phone]  Name: [name] French MRN: [id number] Date of Birth: August 09, 1926

## 2017-03-03 DIAGNOSIS — H0011 Chalazion right upper eyelid: Secondary | ICD-10-CM | POA: Diagnosis not present

## 2017-03-07 ENCOUNTER — Ambulatory Visit: Payer: Medicare Other | Admitting: Physical Therapy

## 2017-03-07 DIAGNOSIS — M6281 Muscle weakness (generalized): Secondary | ICD-10-CM

## 2017-03-07 DIAGNOSIS — M79651 Pain in right thigh: Secondary | ICD-10-CM | POA: Diagnosis not present

## 2017-03-07 NOTE — Patient Instructions (Signed)
Vannesa Abair PT [facility] [address], [address] [place], [place] [address] Phone # [phone] Fax [phone]    

## 2017-03-07 NOTE — Therapy (Signed)
[facility] [address] [place], [place], [address] Phone: [phone]   Fax:  [phone]  Physical Therapy Treatment  Patient Details  Name: [name] French MRN: [id number] Date of Birth: 1926-02-27 Referring Provider: Dr. Ernestina Patches  Encounter Date: 03/07/2017      PT End of Session - 03/07/17 1024    Visit Number 3   Number of Visits 10   Date for PT Re-Evaluation 03/24/17   Authorization Type Medicare G codes   PT Start Time 1016   PT Stop Time 1059   PT Time Calculation (min) 43 min   Activity Tolerance Patient tolerated treatment well      Past Medical History:  Diagnosis Date  . Allergic rhinitis   . Arthritis   . Breast cancer ([place]) 05/08/13   right upper inner, invasive mammary  . Breast cancer, right breast ([place]) 04/16/2013   Underwent lumpectomy on 11/06/13. Path showed G1 ILC, 2.7 cm, neg margins, receptor+, Her2neg   . Diverticulosis   . Hemorrhoids   . Hx of adenomatous colonic polyps 05/2002  . Osteoporosis   . Rosacea   . 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 Use of letrozole (Femara)    neoadjuvant antiestrogen therapy with letrozole 2.5 mg daily x 7 monhts    Past Surgical History:  Procedure Laterality Date  . BREAST BIOPSY Left 1960   lt br bx/benign  . BREAST LUMPECTOMY WITH NEEDLE LOCALIZATION Bilateral 11/06/2013   Procedure: BREAST LUMPECTOMY WITH NEEDLE LOCALIZATION;  Surgeon: Haywood Lasso, MD;  Location: [facility];  Service: General;  Laterality: Bilateral;  . COLONOSCOPY    . EYE SURGERY     both cataracts  . MOHS SURGERY Right    nose basal/squamous  . WRIST SURGERY  1990   lt    There were no vitals filed for this visit.      Subjective Assessment - 03/07/17 1019    Subjective I always feel some pain but it is better.  I do feel achiness in my other leg today for no reason.   Right thigh pain.  No pain on left side.  Sometimes with  walking it goes away.   Doing better with putting socks/shoes on.  Today I will trim my toenails and that will be a true test.   I've been doing the sit to stands.  Not sure about the wall ex's.  I've been walking some.  Going to [place] on Sunday.     Currently in Pain? Yes   Pain Score 6    Pain Location Leg   Pain Orientation Right                         [facility] Adult PT Treatment/Exercise - 03/07/17 0001      Therapeutic Activites    Therapeutic Activities ADL's   ADL's sit to stand, standing, walking     Knee/Hip Exercises: Stretches   Active Hamstring Stretch Right;Left;3 reps;30 seconds  seated   Other Knee/Hip Stretches psoas doorway stretch with UE movements 3x5 right /left     Knee/Hip Exercises: Aerobic   Nustep L1 8 min     Knee/Hip Exercises: Standing   SLS 3x right and left   Other Standing Knee Exercises wall climbs 10x   Other Standing Knee Exercises UE on wall with gluteal squeeze 10x        She declines to need for modalities.  PT Education - 03/07/17 1049    Education provided Yes   Education Details HS stretch; wall climb, glute squeeze'  single leg stand   Person(s) Educated Patient   Methods Explanation;Demonstration;Handout   Comprehension Verbalized understanding;Returned demonstration          PT Short Term Goals - 03/07/17 1313      PT SHORT TERM GOAL #1   Title STGs=LTGs           PT Long Term Goals - 03/07/17 1313      PT LONG TERM GOAL #1   Title The patient will be independent in HEP for LE strengthening and ROM    03/24/17   Time 8   Period Weeks   Status On-going     PT LONG TERM GOAL #2   Title The patient will have improved  left hip flexion to 120 degrees for putting on stockings with greater ease   Time 8   Period Weeks   Status On-going     PT LONG TERM GOAL #3   Title The patient will report minimal to no pain with rising from a standard chair  the majority of the time   Status Achieved      PT LONG TERM GOAL #4   Title Left hip abduction strength improved to 4+/5 needed for standing for longer periods of time without pelvic drop   Time 8   Period Weeks   Status On-going     PT LONG TERM GOAL #5   Title FOTO functional outcome score improved from 34% limitation to 30% indicating improved function with less pain   Period Weeks   Status On-going               Plan - 03/07/17 1305    Clinical Impression Statement The patient report no left LE pain but with new onset of moderate intensity right thigh pain which she reports will often dissipate with walking.  She reports her ability to put on socks and shoes and rise from sit to stand is much better.   She is going to [facility] next week and is concerned she might have an exacerbation of symptoms.  Will follow up in 10 days upon her return and check progress toward goals to determine recert vs. discharge.     PT Next Visit Plan   check progress toward goals;  FOTO;  hip flexion; single leg balance;  gluteal and quad exs;   ionto if needed      Patient will benefit from skilled therapeutic intervention in order to improve the following deficits and impairments:     Visit Diagnosis: Pain in right thigh  Muscle weakness (generalized)     Problem List Patient Active Problem List   Diagnosis Date Noted  . Breast cancer of upper-inner quadrant of right female breast ([place]) 06/10/2013  . Mass of breast, left 05/31/2013  . PERSONAL HISTORY OF COLONIC POLYPS 07/26/2010   Ruben Im, PT 03/07/17 1:15 PM Phone: [phone] Fax: [phone] [name] 03/07/2017, 1:14 PM  [facility] [address] [place], [place], [address] Phone: [phone]   Fax:  [phone]  Name: [name] French MRN: [id number] Date of Birth: 21-Jan-1926

## 2017-03-20 DIAGNOSIS — H43811 Vitreous degeneration, right eye: Secondary | ICD-10-CM | POA: Diagnosis not present

## 2017-03-23 ENCOUNTER — Ambulatory Visit: Payer: Medicare Other | Attending: Physical Medicine and Rehabilitation | Admitting: Physical Therapy

## 2017-03-23 DIAGNOSIS — M79651 Pain in right thigh: Secondary | ICD-10-CM | POA: Diagnosis not present

## 2017-03-23 DIAGNOSIS — M6281 Muscle weakness (generalized): Secondary | ICD-10-CM | POA: Insufficient documentation

## 2017-03-23 NOTE — Patient Instructions (Signed)
Bowden Boody PT [facility] [address], [address] [place], [place] [address] Phone # [phone] Fax [phone]    

## 2017-03-23 NOTE — Therapy (Signed)
[facility] [address], [address] [place], [place], [address] Phone: [phone]   Fax:  [phone]  Physical Therapy Treatment/Discharge Summary  Patient Details  Name: [name] French MRN: [id number] Date of Birth: 21-Mar-1926 Referring Provider: Dr. Alvester Morin  Encounter Date: 03/23/2017      PT End of Session - 03/23/17 1058    Visit Number 4   Number of Visits 10   Date for PT Re-Evaluation 03/24/17   Authorization Type Medicare G codes   PT Start Time 1015   PT Stop Time 1057   PT Time Calculation (min) 42 min   Activity Tolerance Patient tolerated treatment well      Past Medical History:  Diagnosis Date  . Allergic rhinitis   . Arthritis   . Breast cancer ([facility]) 05/08/13   right upper inner, invasive mammary  . Breast cancer, right breast ([facility]) 04/16/2013   Underwent lumpectomy on 11/06/13. Path showed G1 ILC, 2.7 cm, neg margins, receptor+, Her2neg   . Diverticulosis   . Hemorrhoids   . Hx of adenomatous colonic polyps 05/2002  . Osteoporosis   . Rosacea   . 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 Use of letrozole (Femara)    neoadjuvant antiestrogen therapy with letrozole 2.5 mg daily x 7 monhts    Past Surgical History:  Procedure Laterality Date  . BREAST BIOPSY Left 1960   lt br bx/benign  . BREAST LUMPECTOMY WITH NEEDLE LOCALIZATION Bilateral 11/06/2013   Procedure: BREAST LUMPECTOMY WITH NEEDLE LOCALIZATION;  Surgeon: Currie Paris, MD;  Location: [facility];  Service: General;  Laterality: Bilateral;  . COLONOSCOPY    . EYE SURGERY     both cataracts  . MOHS SURGERY Right    nose basal/squamous  . WRIST SURGERY  1990   lt    There were no vitals filed for this visit.      Subjective Assessment - 03/23/17 1015    Subjective Walked a lot in [place] and my leg did fine.  I can almost cross my legs.  I can put on socks and shoes with greater ease, no pain.  I'm in pretty  good shape.  Demonstrates greater ease with sit to stand.   Currently in Pain? No/denies   Pain Score 0-No pain   Pain Location Leg   Pain Type Chronic pain            [facility] PT Assessment - 03/23/17 0001      Observation/Other Assessments   Focus on Therapeutic Outcomes (FOTO)  44%     AROM   Right Hip Flexion 120   Left Hip Flexion 110     Strength   Right Hip Flexion 5/5   Right Hip Extension 5/5   Right Hip ABduction 4+/5   Left Hip Flexion 4+/5   Left Hip Extension 4+/5   Left Knee Extension 5/5        Single leg balance 3-5 sec bilaterally             [facility] Adult PT Treatment/Exercise - 03/23/17 0001      Therapeutic Activites    ADL's sit to stand, standing, walking     Neuro Re-ed    Neuro Re-ed Details  single leg balance     Knee/Hip Exercises: Aerobic   Nustep L1 6 min     Knee/Hip Exercises: Standing   Hip Abduction AROM;Right;Left   Hip Extension AROM;Right;Left;1 set;10 reps   SLS 3x right  and left   Other Standing Knee Exercises marching 15x   Other Standing Knee Exercises review of clams, sit to stand and doorway stretches                PT Education - March 30, 2017 1102    Education provided Yes   Education Details standing hip ex   Person(s) Educated Patient   Methods Explanation;Demonstration;Handout   Comprehension Verbalized understanding;Returned demonstration          PT Short Term Goals - 03/07/17 1313      PT SHORT TERM GOAL #1   Title STGs=LTGs           PT Long Term Goals - 2017/03/30 1022      PT LONG TERM GOAL #1   Title The patient will be independent in HEP for LE strengthening and ROM    03/24/17   Status Achieved     PT LONG TERM GOAL #2   Title The patient will have improved  left hip flexion to 120 degrees for putting on stockings with greater ease   Status Achieved     PT LONG TERM GOAL #3   Title The patient will report minimal to no pain with rising from a standard chair  the majority of  the time   Status Achieved     PT LONG TERM GOAL #4   Title Left hip abduction strength improved to 4+/5 needed for standing for longer periods of time without pelvic drop   Status Achieved     PT LONG TERM GOAL #5   Title FOTO functional outcome score improved from 34% limitation to 30% indicating improved function with less pain Not met               Plan - 03-30-17 1105    Clinical Impression Statement The patient reports no pain in about 2 weeks.  She was able to walk extensively in [place] while sightseeing without the production of leg pain.  She demonstrates the ability to rise from a standard chair without UE assist with ease.  She is able to put on her socks and shoes with greater ease and without pain.  She has been instructed in a HEP for hip ROM, strengthening and to address mild balance deficits.  She has met the majority of rehab goals at this time and she is satisfied with her current functional level.   She did not improve with FOTO functional outcome survey secondary to numerous questions on survey regarding running and hopping that were not appropriate to this patient.         PHYSICAL THERAPY DISCHARGE SUMMARY  Visits from Start of Care: 4  Current functional level related to goals / functional outcomes: See clinical impressions above   Remaining deficits: As above   Education / Equipment: HEP Plan: Patient agrees to discharge.  Patient goals were partially met. Patient is being discharged due to being pleased with the current functional level.  ?????     Patient will benefit from skilled therapeutic intervention in order to improve the following deficits and impairments:     Visit Diagnosis: Pain in right thigh  Muscle weakness (generalized)       G-Codes - 03/30/2017 1357    Functional Assessment Tool Used (Outpatient Only) FOTO; clincial judgement   Functional Limitation Mobility: Walking and moving around   Mobility: Walking and Moving Around  Goal Status ([id number]) At least 1 percent but less than 20 percent impaired, limited or restricted  Mobility: Walking and Moving Around Discharge Status [phone]) At least 1 percent but less than 20 percent impaired, limited or restricted      Problem List Patient Active Problem List   Diagnosis Date Noted  . Breast cancer of upper-inner quadrant of right female breast ([place]) 06/10/2013  . Mass of breast, left 05/31/2013  . PERSONAL HISTORY OF COLONIC POLYPS 07/26/2010   Ruben Im, PT 03/23/17 2:01 PM Phone: [phone] Fax: [phone] [name] 03/23/2017, 1:58 PM  [facility] [address] [place], [place], [address] Phone: [phone]   Fax:  [phone]  Name: TASHEKA HOUSEMAN MRN: [id number] Date of Birth: [birth year]

## 2017-03-28 DIAGNOSIS — E7800 Pure hypercholesterolemia, unspecified: Secondary | ICD-10-CM | POA: Diagnosis not present

## 2017-03-28 DIAGNOSIS — N183 Chronic kidney disease, stage 3 (moderate): Secondary | ICD-10-CM | POA: Diagnosis not present

## 2017-03-28 DIAGNOSIS — M859 Disorder of bone density and structure, unspecified: Secondary | ICD-10-CM | POA: Diagnosis not present

## 2017-04-04 DIAGNOSIS — Z1389 Encounter for screening for other disorder: Secondary | ICD-10-CM | POA: Diagnosis not present

## 2017-04-04 DIAGNOSIS — E7800 Pure hypercholesterolemia, unspecified: Secondary | ICD-10-CM | POA: Diagnosis not present

## 2017-04-04 DIAGNOSIS — Z853 Personal history of malignant neoplasm of breast: Secondary | ICD-10-CM | POA: Diagnosis not present

## 2017-04-04 DIAGNOSIS — M859 Disorder of bone density and structure, unspecified: Secondary | ICD-10-CM | POA: Diagnosis not present

## 2017-04-04 DIAGNOSIS — Z6823 Body mass index (BMI) 23.0-23.9, adult: Secondary | ICD-10-CM | POA: Diagnosis not present

## 2017-04-04 DIAGNOSIS — G4709 Other insomnia: Secondary | ICD-10-CM | POA: Diagnosis not present

## 2017-04-04 DIAGNOSIS — M5416 Radiculopathy, lumbar region: Secondary | ICD-10-CM | POA: Diagnosis not present

## 2017-04-04 DIAGNOSIS — Z0000 Encounter for general adult medical examination without abnormal findings: Secondary | ICD-10-CM | POA: Diagnosis not present

## 2017-04-04 DIAGNOSIS — H268 Other specified cataract: Secondary | ICD-10-CM | POA: Diagnosis not present

## 2017-04-04 DIAGNOSIS — N183 Chronic kidney disease, stage 3 (moderate): Secondary | ICD-10-CM | POA: Diagnosis not present

## 2017-04-20 DIAGNOSIS — H0011 Chalazion right upper eyelid: Secondary | ICD-10-CM | POA: Diagnosis not present

## 2017-04-20 DIAGNOSIS — H43812 Vitreous degeneration, left eye: Secondary | ICD-10-CM | POA: Diagnosis not present

## 2017-07-21 DIAGNOSIS — H0011 Chalazion right upper eyelid: Secondary | ICD-10-CM | POA: Diagnosis not present

## 2017-07-31 ENCOUNTER — Telehealth (INDEPENDENT_AMBULATORY_CARE_PROVIDER_SITE_OTHER): Payer: Self-pay | Admitting: Radiology

## 2017-07-31 NOTE — Telephone Encounter (Signed)
Patient [name] French, states that she had an injection in January and she wants to get a repeat of that injection.  Please advise.

## 2017-07-31 NOTE — Telephone Encounter (Signed)
[place] for left hip intraarticular injection

## 2017-08-01 DIAGNOSIS — D485 Neoplasm of uncertain behavior of skin: Secondary | ICD-10-CM | POA: Diagnosis not present

## 2017-08-01 DIAGNOSIS — D1801 Hemangioma of skin and subcutaneous tissue: Secondary | ICD-10-CM | POA: Diagnosis not present

## 2017-08-01 DIAGNOSIS — D225 Melanocytic nevi of trunk: Secondary | ICD-10-CM | POA: Diagnosis not present

## 2017-08-01 DIAGNOSIS — C4402 Squamous cell carcinoma of skin of lip: Secondary | ICD-10-CM | POA: Diagnosis not present

## 2017-08-01 DIAGNOSIS — L821 Other seborrheic keratosis: Secondary | ICD-10-CM | POA: Diagnosis not present

## 2017-08-01 DIAGNOSIS — L814 Other melanin hyperpigmentation: Secondary | ICD-10-CM | POA: Diagnosis not present

## 2017-08-01 DIAGNOSIS — L570 Actinic keratosis: Secondary | ICD-10-CM | POA: Diagnosis not present

## 2017-08-01 DIAGNOSIS — L728 Other follicular cysts of the skin and subcutaneous tissue: Secondary | ICD-10-CM | POA: Diagnosis not present

## 2017-08-01 DIAGNOSIS — Z85828 Personal history of other malignant neoplasm of skin: Secondary | ICD-10-CM | POA: Diagnosis not present

## 2017-08-01 NOTE — Telephone Encounter (Signed)
I tried to call her back but there was no answer, I will try again later.

## 2017-08-02 NOTE — Telephone Encounter (Signed)
sched for 08/10/17 @ 245pm

## 2017-08-10 ENCOUNTER — Ambulatory Visit (INDEPENDENT_AMBULATORY_CARE_PROVIDER_SITE_OTHER): Payer: Medicare Other | Admitting: Physical Medicine and Rehabilitation

## 2017-08-10 ENCOUNTER — Ambulatory Visit (INDEPENDENT_AMBULATORY_CARE_PROVIDER_SITE_OTHER): Payer: Medicare Other

## 2017-08-10 DIAGNOSIS — M25552 Pain in left hip: Secondary | ICD-10-CM

## 2017-08-10 NOTE — Progress Notes (Signed)
[name] French - 83 y.o. female MRN [id number]  Date of birth: [birth year]  Office Visit Note: Visit Date: 08/10/2017 PCP: Haywood Pao, MD Referred by: Haywood Pao, MD  Subjective: Chief Complaint  Patient presents with  . Left Hip - Pain   HPI: [name] French is a young-appearing 83 year old female that I last saw in January and completed diagnostic and therapeutic anesthetic hip arthrogram on the left. She reports getting excellent relief with the injection and really over the last 3 weeks his headache increased in the left hip and groin pain to the point where she had to come in. She is getting some pain down to the knee at times. Is worse with getting up from a seated position and when she first tries to start walking. It's also worse getting in and out of car. Eyes any paresthesias or focal weakness. She denies any right-sided complaints. She has done physical therapy. She was referred to [place] by Dr. Niel Hummer. She had been looking at her more from back pain standpoint. This is clearly more hip pathology in the patient has done well with hip injection. She likely would do well with hip replacement. She has been taking ibuprofen, 2 capsules as needed.    Review of Systems  Constitutional: Negative for chills, fever, malaise/fatigue and weight loss.  HENT: Negative for hearing loss and sinus pain.   Eyes: Negative for blurred vision, double vision and photophobia.  Respiratory: Negative for cough and shortness of breath.   Cardiovascular: Negative for chest pain, palpitations and leg swelling.  Gastrointestinal: Negative for abdominal pain, nausea and vomiting.  Genitourinary: Negative for flank pain.  Musculoskeletal: Positive for joint pain. Negative for myalgias.  Skin: Negative for itching and rash.  Neurological: Negative for tremors, focal weakness and weakness.  Endo/Heme/Allergies: Negative.   Psychiatric/Behavioral: Negative for depression.  All other systems  reviewed and are negative.  Otherwise per HPI.  Assessment & Plan: Visit Diagnoses:  1. Pain in left hip     Plan: Findings:  Chronic history of left hip and groin pain consistent with hip pathology. X-ray showing significant osteoarthritis. She did well with prior hip injection in January with only returning symptoms over the last several weeks. We've discussed at length the use of anti-inflammatories. We've discussed the use of over-the-counter Aleve 2 tablets twice a day for 2 weeks at the first sign of increasing pain and this may prolong her knee for injection. We are repeat the injection today diagnostically and hopefully therapeutically. She would likely be a good candidate for hip replacement even at 89 she is in good health but fairly active. It would be something that she could talk to Dr. Ninfa Linden about. Injection today to give her good relief during the anesthetic phase.    Meds & Orders: No orders of the defined types were placed in this encounter.   Orders Placed This Encounter  Procedures  . Large Joint Injection/Arthrocentesis  . XR C-ARM NO REPORT    Follow-up: Return if symptoms worsen or fail to improve.   Procedures: Left hip joint injection with fluoroscopic guidance Date/Time: 08/10/2017 3:08 PM Performed by: Magnus Sinning Authorized by: Magnus Sinning   Consent Given by:  Patient Site marked: the procedure site was marked   Timeout: prior to procedure the correct patient, procedure, and site was verified   Indications:  Pain and diagnostic evaluation Location:  Hip Site:  L hip joint Prep: patient was prepped and draped in usual sterile fashion  Needle Size:  22 G Needle Length:  3.5 inches Approach:  Anterior Ultrasound Guidance: No   Fluoroscopic Guidance: Yes   Arthrogram: No   Medications:  3 mL bupivacaine 0.5 %; 80 mg triamcinolone acetonide 40 MG/ML Aspiration Attempted: Yes   Patient tolerance:  Patient tolerated the procedure well with no  immediate complications  There was excellent flow of contrast producing a partial arthrogram of the hip. The patient did have relief of symptoms during the anesthetic phase of the injection.     No notes on file   Clinical History: No specialty comments available.  She reports that she quit smoking about 38 years ago. Her smoking use included Cigarettes. She has a 12.50 pack-year smoking history. She has never used smokeless tobacco. No results for input(s): HGBA1C, LABURIC in the last 8760 hours.  Objective:  VS:  HT:    WT:   BMI:     BP:   HR: bpm  TEMP: ( )  RESP:  Physical Exam  Constitutional: She is oriented to person, place, and time. She appears well-developed and well-nourished.  Eyes: Pupils are equal, round, and reactive to light. Conjunctivae and EOM are normal.  Cardiovascular: Normal rate and intact distal pulses.   Pulmonary/Chest: Effort normal.  Musculoskeletal:  Patient has painful range of motion of the left hip particularly with internal rotation. She does lacks several degrees of rotation is fairly stiff. No pain over the greater trochanters and good distal strength.  Neurological: She is alert and oriented to person, place, and time. She exhibits normal muscle tone.  Skin: Skin is warm and dry. No rash noted. No erythema.  Psychiatric: She has a normal mood and affect. Her behavior is normal.  Nursing note and vitals reviewed.   Ortho Exam Imaging: No results found.  Past Medical/Family/Surgical/Social History: Medications & Allergies reviewed per EMR Patient Active Problem List   Diagnosis Date Noted  . Breast cancer of upper-inner quadrant of right female breast ([place]) 06/10/2013  . Mass of breast, left 05/31/2013  . PERSONAL HISTORY OF COLONIC POLYPS 07/26/2010   Past Medical History:  Diagnosis Date  . Allergic rhinitis   . Arthritis   . Breast cancer ([place]) 05/08/13   right upper inner, invasive mammary  . Breast cancer, right breast ([place])  04/16/2013   Underwent lumpectomy on 11/06/13. Path showed G1 ILC, 2.7 cm, neg margins, receptor+, Her2neg   . Diverticulosis   . Hemorrhoids   . Hx of adenomatous colonic polyps 05/2002  . Osteoporosis   . Rosacea   . 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 Use of letrozole ([place])    neoadjuvant antiestrogen therapy with letrozole 2.5 mg daily x 7 monhts   Family History  Problem Relation Age of Onset  . Heart disease Mother   . Prostate cancer Father   . Lung cancer Sister    Past Surgical History:  Procedure Laterality Date  . BREAST BIOPSY Left 1960   lt br bx/benign  . BREAST LUMPECTOMY WITH NEEDLE LOCALIZATION Bilateral 11/06/2013   Procedure: BREAST LUMPECTOMY WITH NEEDLE LOCALIZATION;  Surgeon: Haywood Lasso, MD;  Location: [facility];  Service: General;  Laterality: Bilateral;  . COLONOSCOPY    . EYE SURGERY     both cataracts  . MOHS SURGERY Right    nose basal/squamous  . WRIST SURGERY  1990   lt   Social History   Occupational History  . Retired    Social History Main Topics  .  Smoking status: Former Smoker    Packs/day: 0.50    Years: 25.00    Types: Cigarettes    Quit date: 12/19/1978  . Smokeless tobacco: Never Used  . Alcohol use Yes     Comment: rare- wine 2-3 times weekly  . Drug use: No  . Sexual activity: Not Currently     Comment: menarche at age14, menopause in 74, HRT x 74, age at first live birth 69's, G37 P3

## 2017-08-10 NOTE — Patient Instructions (Signed)

## 2017-08-10 NOTE — Progress Notes (Deleted)
Increased left hip and groin pain for 2 to 3 weeks. Pain down leg to knee at times. Worse with getting up from seated position and first starts walking and with getting in and out of car.

## 2017-08-14 MED ORDER — TRIAMCINOLONE ACETONIDE 40 MG/ML IJ SUSP
80.0000 mg | INTRAMUSCULAR | Status: AC | PRN
  Administered 2017-08-10: 80 mg via INTRA_ARTICULAR

## 2017-08-14 MED ORDER — BUPIVACAINE HCL 0.5 % IJ SOLN
3.0000 mL | INTRAMUSCULAR | Status: AC | PRN
  Administered 2017-08-10: 3 mL via INTRA_ARTICULAR

## 2017-08-22 DIAGNOSIS — D0412 Carcinoma in situ of skin of left eyelid, including canthus: Secondary | ICD-10-CM | POA: Diagnosis not present

## 2017-09-29 ENCOUNTER — Other Ambulatory Visit: Payer: Self-pay | Admitting: Hematology and Oncology

## 2017-09-29 DIAGNOSIS — Z1231 Encounter for screening mammogram for malignant neoplasm of breast: Secondary | ICD-10-CM

## 2017-09-29 DIAGNOSIS — Z853 Personal history of malignant neoplasm of breast: Secondary | ICD-10-CM

## 2017-10-06 DIAGNOSIS — Z23 Encounter for immunization: Secondary | ICD-10-CM | POA: Diagnosis not present

## 2017-10-09 ENCOUNTER — Other Ambulatory Visit: Payer: Self-pay | Admitting: Internal Medicine

## 2017-10-09 DIAGNOSIS — R918 Other nonspecific abnormal finding of lung field: Secondary | ICD-10-CM | POA: Diagnosis not present

## 2017-10-09 DIAGNOSIS — Z6823 Body mass index (BMI) 23.0-23.9, adult: Secondary | ICD-10-CM | POA: Diagnosis not present

## 2017-10-09 DIAGNOSIS — R042 Hemoptysis: Secondary | ICD-10-CM | POA: Diagnosis not present

## 2017-10-11 ENCOUNTER — Ambulatory Visit
Admission: RE | Admit: 2017-10-11 | Discharge: 2017-10-11 | Disposition: A | Discharge status: Home / Self Care | Payer: Medicare Other | Source: Ambulatory Visit | Attending: Internal Medicine | Admitting: Internal Medicine

## 2017-10-11 DIAGNOSIS — R918 Other nonspecific abnormal finding of lung field: Secondary | ICD-10-CM | POA: Diagnosis not present

## 2017-10-11 MED ORDER — IOPAMIDOL (ISOVUE-300) INJECTION 61%
75.0000 mL | Freq: Once | INTRAVENOUS | Status: AC | PRN
  Administered 2017-10-11: 75 mL via INTRAVENOUS

## 2017-10-13 ENCOUNTER — Telehealth: Payer: Self-pay | Admitting: *Deleted

## 2017-10-13 NOTE — Telephone Encounter (Signed)
This RN received call from Dr Odette Fraction from [facility] - stating due to recent pneumonia he obtained CXR and due to abnormality proceeded to a CT of the chest.  CT shows lung masses concerning for primary bronchogenic tumor.  He has spoke with the patient about above- and understands pt will be seen by Dr Lindi Adie next Tuesday.  " just wanted to let him know, the results are in EPIC, and pt is aware that she will need work up and staging studies for what is probably a new bronchogenic cancer "  This RN informed Dr Odette Fraction above would be communicated to MD upon his return to the office and appropriate work up will be obtained per this office.  No further needs at this time.

## 2017-10-17 ENCOUNTER — Ambulatory Visit (HOSPITAL_BASED_OUTPATIENT_CLINIC_OR_DEPARTMENT_OTHER): Payer: Medicare Other | Admitting: Hematology and Oncology

## 2017-10-17 ENCOUNTER — Telehealth: Payer: Self-pay

## 2017-10-17 ENCOUNTER — Telehealth: Payer: Self-pay | Admitting: Hematology and Oncology

## 2017-10-17 VITALS — BP 141/61 | HR 86 | Temp 97.7°F | Resp 18 | Ht 65.0 in | Wt 138.9 lb

## 2017-10-17 DIAGNOSIS — C3490 Malignant neoplasm of unspecified part of unspecified bronchus or lung: Secondary | ICD-10-CM

## 2017-10-17 DIAGNOSIS — C50211 Malignant neoplasm of upper-inner quadrant of right female breast: Secondary | ICD-10-CM | POA: Diagnosis not present

## 2017-10-17 DIAGNOSIS — M810 Age-related osteoporosis without current pathological fracture: Secondary | ICD-10-CM

## 2017-10-17 DIAGNOSIS — Z170 Estrogen receptor positive status [ER+]: Secondary | ICD-10-CM

## 2017-10-17 DIAGNOSIS — R599 Enlarged lymph nodes, unspecified: Secondary | ICD-10-CM | POA: Diagnosis not present

## 2017-10-17 DIAGNOSIS — R918 Other nonspecific abnormal finding of lung field: Secondary | ICD-10-CM | POA: Diagnosis not present

## 2017-10-17 DIAGNOSIS — K769 Liver disease, unspecified: Secondary | ICD-10-CM

## 2017-10-17 DIAGNOSIS — C3432 Malignant neoplasm of lower lobe, left bronchus or lung: Secondary | ICD-10-CM

## 2017-10-17 NOTE — Progress Notes (Signed)
Patient Care Team: Tisovec, Fransico Him, MD as PCP - General (Internal Medicine) Marcy Panning, MD as Consulting Physician (Oncology)  DIAGNOSIS:  Encounter Diagnoses  Name Primary?  . Malignant neoplasm of upper-inner quadrant of right breast in female, estrogen receptor positive ([place])   . Malignant neoplasm of unspecified part of unspecified bronchus or lung ([place])   . Malignant neoplasm of lower lobe of left lung ([place]) Yes    SUMMARY OF ONCOLOGIC HISTORY:   Breast cancer of upper-inner quadrant of right female breast ([place])   05/08/2013 Initial Biopsy    Right: grade 1-2 invasive mammary carcinoma ER positive PR positive HER-2/neu negative with Ki-67 30% (MRI 2.8 cm)second smaller mass together 3.8cm      05/11/2013 - 11/04/2013 Anti-estrogen oral therapy    Letrozole 2.5 mg Neoadjuvant anti-estrogen therapy      11/06/2013 Surgery    Bilateral Lumpectomies: Left: sclerosing lesion with ALH fibrocystic changes with microcalcifications. Right: Grade 1 ILC 2.7 cm with LCIS       Radiation Therapy    Patient declined      11/19/2013 - 06/09/2015 Anti-estrogen oral therapy    Letrozole 2.5 mg (stopped for arthralgias and myalgias and fatigue accompanied with hair loss)       CHIEF COMPLIANT: New finding of left lung mass  INTERVAL HISTORY: [name] French is a 83 year old with a prior history of breast cancer who is doing very well from the breast cancer standpoint.  She was being evaluated for pneumonia and a chest x-ray and a CT of the chest were performed.  A CT of the chest done on 10/11/2017 showed a 6 cm left lower lobe lung mass with additional lung nodules including a 2.4 cm pulmonary nodule in the right middle lobe as well as 0.3 cm satellite nodules.  There was also a hypodense 0.4 cm right lower lobe lesion.  She was sent to me for discussion regarding workup and treatment options.  Patient reports that she had a couple of episodes of hemoptysis that prompted this whole  workup.  REVIEW OF SYSTEMS:   Constitutional: Denies fevers, chills or abnormal weight loss Eyes: Denies blurriness of vision Ears, nose, mouth, throat, and face: Denies mucositis or sore throat Respiratory: Denies cough, dyspnea or wheezes Cardiovascular: Denies palpitation, chest discomfort Gastrointestinal:  Denies nausea, heartburn or change in bowel habits Skin: Denies abnormal skin rashes Lymphatics: Denies new lymphadenopathy or easy bruising Neurological:Denies numbness, tingling or new weaknesses Behavioral/Psych: Mood is stable, no new changes  Extremities: No lower extremity edema  All other systems were reviewed with the patient and are negative.  I have reviewed the past medical history, past surgical history, social history and family history with the patient and they are unchanged from previous note.  ALLERGIES:  is allergic to hydrocodone; codeine; and neosporin [neomycin-bacitracin zn-polymyx].  MEDICATIONS:  Current Outpatient Prescriptions  Medication Sig Dispense Refill  . Ascorbic Acid (VITAMIN C CR) 1000 MG TBCR Take 1 tablet (1,000 mg total) by mouth daily. 30 each 0  . aspirin 81 MG tablet Take 81 mg by mouth daily.    . Cholecalciferol (VITAMIN D PO) Take 1 tablet by mouth 2 (two) times daily.     . Cholecalciferol (VITAMIN D-3) 1000 UNITS CAPS Take 1 capsule by mouth daily.    . DiphenhydrAMINE HCl, Sleep, (UNISOM SLEEPMELTS) 25 MG TBDP Take 1 tablet (25 mg total) by mouth daily. 28 each   . Doxylamine Succinate, Sleep, (UNISOM PO) Take by mouth at bedtime.    Marland Kitchen  Glucosamine Sulfate 1000 MG TABS Take 1 tablet (1,000 mg total) by mouth daily.  0  . GLUCOSAMINE-CHONDROITIN PO Take 1 tablet by mouth daily.     . Loperamide HCl (IMODIUM PO) Take 1 tablet by mouth as needed.      . Loratadine (CLARITIN) 10 MG CAPS Take 1 capsule (10 mg total) by mouth daily. 30 each   . pravastatin (PRAVACHOL) 40 MG tablet     . Probiotic Product (ALIGN PO) Take 1 tablet by  mouth daily.      . SUPER B COMPLEX & C TABS Take 1 tablet by mouth daily.  0  . zolpidem (AMBIEN) 5 MG tablet   2   No current facility-administered medications for this visit.     PHYSICAL EXAMINATION: ECOG PERFORMANCE STATUS: 1 - Symptomatic but completely ambulatory  Vitals:   10/17/17 1011  BP: (!) 141/61  Pulse: 86  Resp: 18  Temp: 97.7 F (36.5 C)  SpO2: 95%   Filed Weights   10/17/17 1011  Weight: 138 lb 14.4 oz (63 kg)    GENERAL:alert, no distress and comfortable SKIN: skin color, texture, turgor are normal, no rashes or significant lesions EYES: normal, Conjunctiva are pink and non-injected, sclera clear OROPHARYNX:no exudate, no erythema and lips, buccal mucosa, and tongue normal  NECK: supple, thyroid normal size, non-tender, without nodularity LYMPH:  no palpable lymphadenopathy in the cervical, axillary or inguinal LUNGS: clear to auscultation and percussion with normal breathing effort HEART: regular rate & rhythm and no murmurs and no lower extremity edema ABDOMEN:abdomen soft, non-tender and normal bowel sounds MUSCULOSKELETAL:no cyanosis of digits and no clubbing  NEURO: alert & oriented x 3 with fluent speech, no focal motor/sensory deficits EXTREMITIES: No lower extremity edema  LABORATORY DATA:  I have reviewed the data as listed   Chemistry      Component Value Date/Time   NA 141 04/13/2015 0946   K 4.3 04/13/2015 0946   CL 104 06/10/2013 1246   CO2 25 04/13/2015 0946   BUN 12.9 04/13/2015 0946   CREATININE 0.8 04/13/2015 0946      Component Value Date/Time   CALCIUM 9.5 04/13/2015 0946   ALKPHOS 54 04/13/2015 0946   AST 11 04/13/2015 0946   ALT 10 04/13/2015 0946   BILITOT 0.53 04/13/2015 0946       Lab Results  Component Value Date   WBC 5.4 04/13/2015   HGB 14.4 04/13/2015   HCT 44.2 04/13/2015   MCV 90.2 04/13/2015   PLT 227 04/13/2015   NEUTROABS 2.9 04/13/2015    ASSESSMENT & PLAN:  Breast cancer of upper-inner  quadrant of right female breast stage II (T2 Nx) invasive lobular carcinoma of the right breast status post neoadjuvant antiestrogen therapy initially for 7 months followed by bilateral lumpectomies. On the right patient had a 2.7 cm residual disease that was grade 1. On the left she only had atypical lobular hyperplasia and sclerosing lesion with lobular neoplasia. The patient did not receive adjuvant radiation therapy. She however has been continued on adjuvant antiestrogen therapy with letrozole 2.5 mg daily until 10/14/2015 because of myalgias.   Osteoporosis: She stopped Fosamax under the care of her PCP  CT chest 10/11/2017: Medial left lower lobe 6 cm solid mass suspicious for lung cancer with associated postobstructive pneumonia.  Right middle lobe 2.4 cm pulmonary nodule, 2 additional pulmonary nodules 3 mm each in the lingula and right upper lobe, mild left infrahilar adenopathy, hypodense right liver lobe lesion too small to  characterize 0.4 cm  Radiology review: I discussed with the patient that these findings are highly suspicious for lung cancer.  Plan: 1. PET/CT scan and brain MRI 2. Referral to CT surgery for biopsy 3. Treatment options based upon histology. I spoke to interventional radiology and they recommended that we get pulmonary consulted for biopsies.  I spent 45 minutes talking to the patient of which more than half was spent in counseling and coordination of care.  Orders Placed This Encounter  Procedures  . CT BIOPSY    Standing Status:   Future    Standing Expiration Date:   11/21/2018    Order Specific Question:   Reason for Exam (SYMPTOM  OR DIAGNOSIS REQUIRED)    Answer:   Lung mass biopsy    Order Specific Question:   Preferred imaging location?    Answer:   [facility]    Comments:   Dr.Hoss discussed  . NM PET Image Initial (PI) Skull Base To Thigh    Standing Status:   Future    Standing Expiration Date:   10/17/2018    Order Specific  Question:   If indicated for the ordered procedure, I authorize the administration of a radiopharmaceutical per Radiology protocol    Answer:   Yes    Order Specific Question:   Preferred imaging location?    Answer:   [facility]    Order Specific Question:   Radiology Contrast Protocol - do NOT remove file path    Answer:   [email or web address]    Order Specific Question:   Reason for Exam additional comments    Answer:   Lung cancer Initial staging  . MR Brain W Wo Contrast    Standing Status:   Future    Standing Expiration Date:   10/17/2018    Order Specific Question:   If indicated for the ordered procedure, I authorize the administration of contrast media per Radiology protocol    Answer:   Yes    Order Specific Question:   What is the patient's sedation requirement?    Answer:   No Sedation    Order Specific Question:   Does the patient have a pacemaker or implanted devices?    Answer:   No    Order Specific Question:   Radiology Contrast Protocol - do NOT remove file path    Answer:   \\[email or web address]    Order Specific Question:   Preferred imaging location?    Answer:   [facility] (table limit-350 lbs)   The patient has a good understanding of the overall plan. she agrees with it. she will call with any problems that may develop before the next visit here.   Rulon Eisenmenger, MD 10/17/17

## 2017-10-17 NOTE — Telephone Encounter (Signed)
Called pt to confirm time/date of her pet ct and mri brain on 11/2. Told pt that she needs to be npo 6hrs prior to test and to arrive early at [facility] long prior to pet scan. Pt instructed to check her vm when she gets home. Dr.Gudena had called and lvm on her home phone regarding her biopsy. Pt verbalized understanding and will call if she has further questions. No further needs at this time.

## 2017-10-17 NOTE — Telephone Encounter (Signed)
Scheduled appt per 10/30 los. Gave patient avs and calendar with appts.

## 2017-10-17 NOTE — Assessment & Plan Note (Signed)
stage II (T2 Nx) invasive lobular carcinoma of the right breast status post neoadjuvant antiestrogen therapy initially for 7 months followed by bilateral lumpectomies. On the right patient had a 2.7 cm residual disease that was grade 1. On the left she only had atypical lobular hyperplasia and sclerosing lesion with lobular neoplasia. The patient did not receive adjuvant radiation therapy. She however has been continued on adjuvant antiestrogen therapy with letrozole 2.5 mg daily until 10/14/2015 because of myalgias.   Osteoporosis: She stopped Fosamax under the care of her PCP  CT chest 10/11/2017: Medial left lower lobe 6 cm solid mass suspicious for lung cancer with associated postobstructive pneumonia.  Right middle lobe 2.4 cm pulmonary nodule, 2 additional pulmonary nodules 3 mm each in the lingula and right upper lobe, mild left infrahilar adenopathy, hypodense right liver lobe lesion too small to characterize 0.4 cm  Radiology review: I discussed with the patient that these findings are highly suspicious for lung cancer.  Plan: 1. PET/CT scan and brain MRI 2. Referral to CT surgery for biopsy 3. Treatment options based upon histology.

## 2017-10-19 ENCOUNTER — Encounter: Payer: Self-pay | Admitting: *Deleted

## 2017-10-19 DIAGNOSIS — C3490 Malignant neoplasm of unspecified part of unspecified bronchus or lung: Secondary | ICD-10-CM

## 2017-10-19 HISTORY — DX: Malignant neoplasm of unspecified part of unspecified bronchus or lung: C34.90

## 2017-10-19 NOTE — Progress Notes (Signed)
Oncology Nurse Navigator Documentation  Oncology Nurse Navigator Flowsheets 10/19/2017  Navigator Location [facility]  Navigator Encounter Type Other/Ms. Mattern's case was discussed at thoracic cancer conference this am.  I updated Dr. Lindi Adie on recommendations.   Treatment Phase Abnormal Scans  Barriers/Navigation Needs Coordination of Care  Interventions Coordination of Care  Coordination of Care Other  Acuity Level 2  Acuity Level 2 Other  Time Spent with Patient 30

## 2017-10-20 ENCOUNTER — Ambulatory Visit (HOSPITAL_COMMUNITY)
Admission: RE | Admit: 2017-10-20 | Discharge: 2017-10-20 | Disposition: A | Discharge status: Home / Self Care | Payer: Medicare Other | Source: Ambulatory Visit | Attending: Hematology and Oncology | Admitting: Hematology and Oncology

## 2017-10-20 ENCOUNTER — Encounter (HOSPITAL_COMMUNITY)
Admission: RE | Admit: 2017-10-20 | Discharge: 2017-10-20 | Disposition: A | Discharge status: Home / Self Care | Payer: Medicare Other | Source: Ambulatory Visit | Attending: Hematology and Oncology | Admitting: Hematology and Oncology

## 2017-10-20 DIAGNOSIS — C3490 Malignant neoplasm of unspecified part of unspecified bronchus or lung: Secondary | ICD-10-CM

## 2017-10-20 DIAGNOSIS — I700 Atherosclerosis of aorta: Secondary | ICD-10-CM | POA: Insufficient documentation

## 2017-10-20 DIAGNOSIS — R9389 Abnormal findings on diagnostic imaging of other specified body structures: Secondary | ICD-10-CM | POA: Insufficient documentation

## 2017-10-20 DIAGNOSIS — K8020 Calculus of gallbladder without cholecystitis without obstruction: Secondary | ICD-10-CM | POA: Insufficient documentation

## 2017-10-20 DIAGNOSIS — Z79899 Other long term (current) drug therapy: Secondary | ICD-10-CM | POA: Insufficient documentation

## 2017-10-20 DIAGNOSIS — R918 Other nonspecific abnormal finding of lung field: Secondary | ICD-10-CM | POA: Insufficient documentation

## 2017-10-20 DIAGNOSIS — G936 Cerebral edema: Secondary | ICD-10-CM | POA: Insufficient documentation

## 2017-10-20 DIAGNOSIS — D320 Benign neoplasm of cerebral meninges: Secondary | ICD-10-CM | POA: Insufficient documentation

## 2017-10-20 LAB — GLUCOSE, CAPILLARY: Glucose-Capillary: 99 mg/dL (ref 65–99)

## 2017-10-20 LAB — POCT I-STAT CREATININE: CREATININE: 0.7 mg/dL (ref 0.44–1.00)

## 2017-10-20 MED ORDER — GADOBENATE DIMEGLUMINE 529 MG/ML IV SOLN
15.0000 mL | Freq: Once | INTRAVENOUS | Status: AC | PRN
  Administered 2017-10-20: 14 mL via INTRAVENOUS

## 2017-10-20 MED ORDER — FLUDEOXYGLUCOSE F - 18 (FDG) INJECTION
6.8600 | Freq: Once | INTRAVENOUS | Status: AC | PRN
  Administered 2017-10-20: 6.86 via INTRAVENOUS

## 2017-10-23 ENCOUNTER — Institutional Professional Consult (permissible substitution) (INDEPENDENT_AMBULATORY_CARE_PROVIDER_SITE_OTHER): Payer: Medicare Other | Admitting: Thoracic Surgery (Cardiothoracic Vascular Surgery)

## 2017-10-23 ENCOUNTER — Encounter: Payer: Self-pay | Admitting: Thoracic Surgery (Cardiothoracic Vascular Surgery)

## 2017-10-23 VITALS — BP 136/76 | HR 100 | Ht 65.0 in | Wt 138.0 lb

## 2017-10-23 DIAGNOSIS — R911 Solitary pulmonary nodule: Secondary | ICD-10-CM

## 2017-10-23 DIAGNOSIS — R918 Other nonspecific abnormal finding of lung field: Secondary | ICD-10-CM | POA: Diagnosis not present

## 2017-10-23 NOTE — H&P (View-Only) (Signed)
PCP is Tisovec, Fransico Him, MD Referring Provider is Nicholas Lose, MD  Chief Complaint  Patient presents with  . New Patient (Initial Visit)    left lung mass with hemoptysis, pet 10/20/2017, needs bronch and biopsies    HPI: [name] French is an 83 year old woman sent for consultation regarding bilateral lung masses.  [name] French is an 83 year old woman with a past medical history significant for breast cancer, arthritis, osteoporosis, and remote tobacco use.  She smoked less than a pack a day for about 15 years before quitting in 1980.  She was in her usual state of health until a little over a month ago.  She developed a frequent dry cough.  About 3 weeks ago she had an episode of hemoptysis.  This was an isolated event.  She saw Dr. Osborne Casco.  A chest x-ray was done followed by a CT of the chest.  The CT showed a 6 cm mass in the left lower lobe with postobstructive atelectasis.  There also was a 2.5 cm mixed solid/sub-solid nodule in the right middle lobe.  She went to Dr. Lindi Adie who ordered a PET/CT and MRI of the brain.  PET/CT showed the left lower lobe mass was markedly hypermetabolic.  There also was activity in the right middle lobe nodule.  The MR of the brain showed a meningioma but no evidence of metastatic disease.  She continues to have a dry cough.  As noted the hemoptysis was an isolated incident.  She complains of decreased energy, but has a good appetite and has not had any significant weight loss.  She lives at [place] but takes care of her own ADLs.  She denies chest pain, pressure, or tightness at rest or with exertion. Zubrod Score: At the time of surgery this patient's most appropriate activity status/level should be described as: []     0    Normal activity, no symptoms [x]     1    Restricted in physical strenuous activity but ambulatory, able to do out light work []     2    Ambulatory and capable of self care, unable to do work activities, up and about >50 % of waking hours                               []     3    Only limited self care, in bed greater than 50% of waking hours []     4    Completely disabled, no self care, confined to bed or chair []     5    Moribund   Past Medical History:  Diagnosis Date  . Allergic rhinitis   . Arthritis   . Breast cancer ([place]) 05/08/13   right upper inner, invasive mammary  . Breast cancer, right breast ([place]) 04/16/2013   Underwent lumpectomy on 11/06/13. Path showed G1 ILC, 2.7 cm, neg margins, receptor+, Her2neg   . Diverticulosis   . Hemorrhoids   . Hx of adenomatous colonic polyps 05/2002  . Osteoporosis   . Rosacea   . 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 Use of letrozole ([place])    neoadjuvant antiestrogen therapy with letrozole 2.5 mg daily x 7 monhts    Past Surgical History:  Procedure Laterality Date  . BREAST BIOPSY Left 1960   lt br bx/benign  . COLONOSCOPY    . EYE SURGERY     both cataracts  . MOHS SURGERY Right  nose basal/squamous  . WRIST SURGERY  1990   lt    Family History  Problem Relation Age of Onset  . Heart disease Mother   . Prostate cancer Father   . Lung cancer Sister     Social History Social History   Tobacco Use  . Smoking status: Former Smoker    Packs/day: 0.50    Years: 25.00    Pack years: 12.50    Types: Cigarettes    Last attempt to quit: 12/19/1978    Years since quitting: 38.8  . Smokeless tobacco: Never Used  Substance Use Topics  . Alcohol use: Yes    Comment: rare- wine 2-3 times weekly  . Drug use: No    Current Outpatient Medications  Medication Sig Dispense Refill  . Ascorbic Acid (VITAMIN C CR) 1000 MG TBCR Take 1 tablet (1,000 mg total) by mouth daily. 30 each 0  . aspirin 81 MG tablet Take 81 mg by mouth daily.    . cetirizine (ZYRTEC) 10 MG tablet Take 10 mg as needed by mouth for allergies.    . Cholecalciferol (VITAMIN D PO) Take 1 tablet by mouth 2 (two) times daily.     . Cholecalciferol (VITAMIN D-3) 1000 UNITS CAPS Take 1  capsule by mouth daily.    . DiphenhydrAMINE HCl, Sleep, (UNISOM SLEEPMELTS) 25 MG TBDP Take 1 tablet (25 mg total) by mouth daily. 28 each   . Glucosamine Sulfate 1000 MG TABS Take 1 tablet (1,000 mg total) by mouth daily.  0  . pravastatin (PRAVACHOL) 40 MG tablet     . Probiotic Product (ALIGN PO) Take 1 tablet by mouth daily.      . SUPER B COMPLEX & C TABS Take 1 tablet by mouth daily.  0  . Loperamide HCl (IMODIUM PO) Take 1 tablet by mouth as needed.      . zolpidem (AMBIEN) 5 MG tablet   2   No current facility-administered medications for this visit.     Allergies  Allergen Reactions  . Hydrocodone   . Codeine Itching  . Neosporin [Neomycin-Bacitracin Zn-Polymyx] Rash    Review of Systems  Constitutional: Positive for fatigue. Negative for activity change, appetite change and unexpected weight change.  HENT: Negative for trouble swallowing and voice change.   Eyes: Negative for visual disturbance.  Respiratory: Positive for cough. Negative for shortness of breath and wheezing.   Cardiovascular: Negative for chest pain and leg swelling.  Genitourinary: Negative for difficulty urinating and dysuria.  Musculoskeletal: Positive for arthralgias and joint swelling.  Neurological: Negative for syncope, weakness and headaches.  Hematological: Negative for adenopathy. Does not bruise/bleed easily.  All other systems reviewed and are negative.   BP 136/76   Pulse 100   Ht 5\' 5"  (1.651 m)   Wt 138 lb (62.6 kg)   SpO2 94%   BMI 22.96 kg/m  Physical Exam  Constitutional: She is oriented to person, place, and time. She appears well-developed and well-nourished. No distress.  HENT:  Head: Normocephalic and atraumatic.  Mouth/Throat: No oropharyngeal exudate.  Eyes: Conjunctivae and EOM are normal. No scleral icterus.  Neck: Neck supple. No thyromegaly present.  Cardiovascular: Normal rate, regular rhythm, normal heart sounds and intact distal pulses. Exam reveals no gallop and  no friction rub.  No murmur heard. Pulmonary/Chest: Effort normal. She has no wheezes.  Diminished BS left base  Abdominal: Soft. She exhibits no distension. There is no tenderness.  Musculoskeletal: She exhibits no edema.  Lymphadenopathy:  She has no cervical adenopathy.  Neurological: She is alert and oriented to person, place, and time. No cranial nerve deficit.  Skin: Skin is warm and dry.  Psychiatric: She has a normal mood and affect.  Vitals reviewed.    Diagnostic Tests: CT CHEST WITH CONTRAST  TECHNIQUE: Multidetector CT imaging of the chest was performed during intravenous contrast administration.  CONTRAST:  30mL ISOVUE-300 IOPAMIDOL (ISOVUE-300) INJECTION 61%  COMPARISON:  None.  FINDINGS: Cardiovascular: Normal heart size. No significant pericardial fluid/thickening. Left anterior descending and right coronary atherosclerosis. Atherosclerotic nonaneurysmal thoracic aorta. Normal caliber pulmonary arteries. No central pulmonary emboli.  Mediastinum/Nodes: No discrete thyroid nodules. Unremarkable esophagus. No axillary adenopathy. No pathologically enlarged mediastinal lymph nodes. Enlarged 1.0 cm left infrahilar node (series 2/ image 75). No additional enlarged hilar nodes.  Lungs/Pleura: No pneumothorax. No right pleural effusion. Small dependent basilar left pleural effusion. There is an irregular 6.0 x 5.5 cm solid central left lower lobe lung mass (series 2/image 95), with associated occlusion of segmental airways in the medial left lower lobe by an endobronchial component. Associated postobstructive pneumonia/atelectasis in the medial basilar left lower lobe. Lingular 3 mm solid pulmonary nodule (series 5/image 78). Anterior right upper lobe 3 mm solid pulmonary nodule (series 5/image 59). Subsolid 2.4 x 2.0 cm right middle lobe pulmonary nodule (series 5/image 70) with 0.7 cm solid component, with associated distortion of the minor  fissure.  Upper abdomen: Hypodense 0.4 cm right liver lobe lesion (series 2/ image 131). No discrete adrenal nodules.  Musculoskeletal: No aggressive appearing focal osseous lesions. Mild thoracic spondylosis.  IMPRESSION: 1. Medial left lower lobe 6.0 cm solid lung mass, suspicious for primary bronchogenic carcinoma, with endobronchial component occluding segmental medial left lower lobe airways. Associated postobstructive pneumonia/atelectasis in the medial left lower lobe. Multidisciplinary thoracic oncology consultation and PET-CT staging are indicated, and may be pursued as clinically warranted. 2. Right middle lobe subsolid 2.4 cm pulmonary nodule with 0.7 cm solid component and distortion of the adjacent minor fissure, suspicious for synchronous primary bronchogenic adenocarcinoma. 3. Two additional tiny solid pulmonary nodules, which warrant attention on follow-up chest CT in 3-6 months. 4. Mild left infrahilar adenopathy suspicious for nodal metastasis. No mediastinal or supraclavicular adenopathy. 5. Subcentimeter hypodense right liver lobe lesion, too small to characterize, which warrants attention on follow-up MRI (preferred) or CT abdomen without and with IV contrast in 3-6 months. This recommendation follows ACR consensus guidelines: Managing Incidental Findings on Abdominal CT: White Paper of the ACR Incidental Findings Committee. J Am Coll Radiol 2010;7:754-773. 6. Two vessel coronary atherosclerosis.  Aortic Atherosclerosis (ICD10-I70.0).  These results will be called to the ordering clinician or representative by the Radiologist Assistant, and communication documented in the PACS or zVision Dashboard.   Electronically Signed   By: Ilona Sorrel M.D.   On: 10/11/2017 17:28 NUCLEAR MEDICINE PET SKULL BASE TO THIGH  TECHNIQUE: 6.9 mCi F-18 FDG was injected intravenously. Full-ring PET imaging was performed from the skull base to thigh after the  radiotracer. CT data was obtained and used for attenuation correction and anatomic localization.  FASTING BLOOD GLUCOSE:  Value: 99 mg/dl  COMPARISON:  CT chest 10/11/2017.  FINDINGS: NECK: No hypermetabolic lymph nodes in the neck.  CHEST: 6 cm central left lower lobe pulmonary mass is markedly hypermetabolic with SUV max = 51.8 and extends into the left hilum.  The sub solid 2.4 cm pulmonary nodule in the right middle lobe also shows FDG accumulation with SUV max = 1.9.  No  evidence for hypermetabolic mediastinal or right hilar lymphadenopathy.  ABDOMEN/PELVIS: No abnormal hypermetabolic activity within the liver, pancreas, adrenal glands, or spleen. No hypermetabolic lymph nodes in the abdomen or pelvis.  Focal hypermetabolism identified in the right colon, at upper portion compare to background FDG uptake in the bowel.  Tiny layering gallstones evident. There is abdominal aortic atherosclerosis without aneurysm.  SKELETON: Focal hypermetabolic FDG accumulation is identified in the anterior aspect of the L2 vertebral body without underlying lesion evident on CT imaging. This is in a region of degenerative change.  IMPRESSION: 1. Central left lower lobe pulmonary mass is markedly hypermetabolic consistent with neoplasm. 2. Sub solid 2.4 cm pulmonary lesion in the right middle lobe also shows FDG uptake, concerning for neoplasm such as adenocarcinoma. 3. Focal hypermetabolism in the right colon without underlying gross mass lesion evident on CT imaging. This can be seen in the setting of adenoma or neoplasm. 4. Tiny focus of FDG accumulation in the anterior aspect of the L2 vertebral body, potentially degenerative. Close attention on follow-up recommended. 5. Cholelithiasis. 6.  Aortic Atherosclerois (ICD10-170.0)   Electronically Signed   By: Misty Stanley M.D.   On: 10/20/2017 14:15  Impression: [name] French is an 83 year old woman with a prior  history of breast cancer and a remote history of tobacco use who presented with hemoptysis.  She has been found to have a 6 cm left lower lobe lung mass with a significant endobronchial component and postobstructive atelectasis.  This is markedly hypermetabolic on PET CT and is highly suspicious for a new primary bronchogenic carcinoma.  She also has a mixed groundglass/solid nodule in the right middle lobe that was also hypermetabolic.  This is highly suspicious for a second primary.  She needs a tissue diagnosis to guide therapy.  The left lower lobe mass does not appear to be resectable.  That is particularly true given her age.  I recommend that we proceed with a navigational bronchoscopy.  We should be able to access the left lower lobe mass with direct vision with the scope.  But navigational bronchoscopy will allow [place] to also sample the right middle lobe lesion.  I described the general nature of the procedure to her.  She understands this would be done in the operating room under general anesthesia.  It is an endoscopic procedure.  We will plan to do it on an outpatient basis.  I discussed the indications, risks, benefits, and alternatives.  She understands the risks include, but not limited to death, MI, stroke, DVT, PE, bleeding, pneumothorax, failure to make a diagnosis, as well as the possibility of other unforeseeable complications.  She accepts the risks and wishes to proceed.  Plan: Navigational bronchoscopy for biopsy of left lower lobe and right middle lobe lung lesions on Wednesday, 11/01/2017.  (She prefers to wait until that time so that her daughter will be back at the time of the procedure.)  [name], MD [facility] [phone]

## 2017-10-23 NOTE — Progress Notes (Signed)
PCP is Tisovec, Fransico Him, MD Referring Provider is Nicholas Lose, MD  Chief Complaint  Patient presents with  . New Patient (Initial Visit)    left lung mass with hemoptysis, pet 10/20/2017, needs bronch and biopsies    HPI: [name] French is an 83 year old woman sent for consultation regarding bilateral lung masses.  [name] French is an 83 year old woman with a past medical history significant for breast cancer, arthritis, osteoporosis, and remote tobacco use.  She smoked less than a pack a day for about 15 years before quitting in 1980.  She was in her usual state of health until a little over a month ago.  She developed a frequent dry cough.  About 3 weeks ago she had an episode of hemoptysis.  This was an isolated event.  She saw Dr. Osborne Casco.  A chest x-ray was done followed by a CT of the chest.  The CT showed a 6 cm mass in the left lower lobe with postobstructive atelectasis.  There also was a 2.5 cm mixed solid/sub-solid nodule in the right middle lobe.  She went to Dr. Lindi Adie who ordered a PET/CT and MRI of the brain.  PET/CT showed the left lower lobe mass was markedly hypermetabolic.  There also was activity in the right middle lobe nodule.  The MR of the brain showed a meningioma but no evidence of metastatic disease.  She continues to have a dry cough.  As noted the hemoptysis was an isolated incident.  She complains of decreased energy, but has a good appetite and has not had any significant weight loss.  She lives at [place] but takes care of her own ADLs.  She denies chest pain, pressure, or tightness at rest or with exertion. Zubrod Score: At the time of surgery this patient's most appropriate activity status/level should be described as: []     0    Normal activity, no symptoms [x]     1    Restricted in physical strenuous activity but ambulatory, able to do out light work []     2    Ambulatory and capable of self care, unable to do work activities, up and about >50 % of waking hours                               []     3    Only limited self care, in bed greater than 50% of waking hours []     4    Completely disabled, no self care, confined to bed or chair []     5    Moribund   Past Medical History:  Diagnosis Date  . Allergic rhinitis   . Arthritis   . Breast cancer ([place]) 05/08/13   right upper inner, invasive mammary  . Breast cancer, right breast ([place]) 04/16/2013   Underwent lumpectomy on 11/06/13. Path showed G1 ILC, 2.7 cm, neg margins, receptor+, Her2neg   . Diverticulosis   . Hemorrhoids   . Hx of adenomatous colonic polyps 05/2002  . Osteoporosis   . Rosacea   . 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 Use of letrozole ([place])    neoadjuvant antiestrogen therapy with letrozole 2.5 mg daily x 7 monhts    Past Surgical History:  Procedure Laterality Date  . BREAST BIOPSY Left 1960   lt br bx/benign  . COLONOSCOPY    . EYE SURGERY     both cataracts  . MOHS SURGERY Right  nose basal/squamous  . WRIST SURGERY  1990   lt    Family History  Problem Relation Age of Onset  . Heart disease Mother   . Prostate cancer Father   . Lung cancer Sister     Social History Social History   Tobacco Use  . Smoking status: Former Smoker    Packs/day: 0.50    Years: 25.00    Pack years: 12.50    Types: Cigarettes    Last attempt to quit: 12/19/1978    Years since quitting: 38.8  . Smokeless tobacco: Never Used  Substance Use Topics  . Alcohol use: Yes    Comment: rare- wine 2-3 times weekly  . Drug use: No    Current Outpatient Medications  Medication Sig Dispense Refill  . Ascorbic Acid (VITAMIN C CR) 1000 MG TBCR Take 1 tablet (1,000 mg total) by mouth daily. 30 each 0  . aspirin 81 MG tablet Take 81 mg by mouth daily.    . cetirizine (ZYRTEC) 10 MG tablet Take 10 mg as needed by mouth for allergies.    . Cholecalciferol (VITAMIN D PO) Take 1 tablet by mouth 2 (two) times daily.     . Cholecalciferol (VITAMIN D-3) 1000 UNITS CAPS Take 1  capsule by mouth daily.    . DiphenhydrAMINE HCl, Sleep, (UNISOM SLEEPMELTS) 25 MG TBDP Take 1 tablet (25 mg total) by mouth daily. 28 each   . Glucosamine Sulfate 1000 MG TABS Take 1 tablet (1,000 mg total) by mouth daily.  0  . pravastatin (PRAVACHOL) 40 MG tablet     . Probiotic Product (ALIGN PO) Take 1 tablet by mouth daily.      . SUPER B COMPLEX & C TABS Take 1 tablet by mouth daily.  0  . Loperamide HCl (IMODIUM PO) Take 1 tablet by mouth as needed.      . zolpidem (AMBIEN) 5 MG tablet   2   No current facility-administered medications for this visit.     Allergies  Allergen Reactions  . Hydrocodone   . Codeine Itching  . Neosporin [Neomycin-Bacitracin Zn-Polymyx] Rash    Review of Systems  Constitutional: Positive for fatigue. Negative for activity change, appetite change and unexpected weight change.  HENT: Negative for trouble swallowing and voice change.   Eyes: Negative for visual disturbance.  Respiratory: Positive for cough. Negative for shortness of breath and wheezing.   Cardiovascular: Negative for chest pain and leg swelling.  Genitourinary: Negative for difficulty urinating and dysuria.  Musculoskeletal: Positive for arthralgias and joint swelling.  Neurological: Negative for syncope, weakness and headaches.  Hematological: Negative for adenopathy. Does not bruise/bleed easily.  All other systems reviewed and are negative.   BP 136/76   Pulse 100   Ht 5\' 5"  (1.651 m)   Wt 138 lb (62.6 kg)   SpO2 94%   BMI 22.96 kg/m  Physical Exam  Constitutional: She is oriented to person, place, and time. She appears well-developed and well-nourished. No distress.  HENT:  Head: Normocephalic and atraumatic.  Mouth/Throat: No oropharyngeal exudate.  Eyes: Conjunctivae and EOM are normal. No scleral icterus.  Neck: Neck supple. No thyromegaly present.  Cardiovascular: Normal rate, regular rhythm, normal heart sounds and intact distal pulses. Exam reveals no gallop and  no friction rub.  No murmur heard. Pulmonary/Chest: Effort normal. She has no wheezes.  Diminished BS left base  Abdominal: Soft. She exhibits no distension. There is no tenderness.  Musculoskeletal: She exhibits no edema.  Lymphadenopathy:  She has no cervical adenopathy.  Neurological: She is alert and oriented to person, place, and time. No cranial nerve deficit.  Skin: Skin is warm and dry.  Psychiatric: She has a normal mood and affect.  Vitals reviewed.    Diagnostic Tests: CT CHEST WITH CONTRAST  TECHNIQUE: Multidetector CT imaging of the chest was performed during intravenous contrast administration.  CONTRAST:  42mL ISOVUE-300 IOPAMIDOL (ISOVUE-300) INJECTION 61%  COMPARISON:  None.  FINDINGS: Cardiovascular: Normal heart size. No significant pericardial fluid/thickening. Left anterior descending and right coronary atherosclerosis. Atherosclerotic nonaneurysmal thoracic aorta. Normal caliber pulmonary arteries. No central pulmonary emboli.  Mediastinum/Nodes: No discrete thyroid nodules. Unremarkable esophagus. No axillary adenopathy. No pathologically enlarged mediastinal lymph nodes. Enlarged 1.0 cm left infrahilar node (series 2/ image 75). No additional enlarged hilar nodes.  Lungs/Pleura: No pneumothorax. No right pleural effusion. Small dependent basilar left pleural effusion. There is an irregular 6.0 x 5.5 cm solid central left lower lobe lung mass (series 2/image 95), with associated occlusion of segmental airways in the medial left lower lobe by an endobronchial component. Associated postobstructive pneumonia/atelectasis in the medial basilar left lower lobe. Lingular 3 mm solid pulmonary nodule (series 5/image 78). Anterior right upper lobe 3 mm solid pulmonary nodule (series 5/image 59). Subsolid 2.4 x 2.0 cm right middle lobe pulmonary nodule (series 5/image 70) with 0.7 cm solid component, with associated distortion of the minor  fissure.  Upper abdomen: Hypodense 0.4 cm right liver lobe lesion (series 2/ image 131). No discrete adrenal nodules.  Musculoskeletal: No aggressive appearing focal osseous lesions. Mild thoracic spondylosis.  IMPRESSION: 1. Medial left lower lobe 6.0 cm solid lung mass, suspicious for primary bronchogenic carcinoma, with endobronchial component occluding segmental medial left lower lobe airways. Associated postobstructive pneumonia/atelectasis in the medial left lower lobe. Multidisciplinary thoracic oncology consultation and PET-CT staging are indicated, and may be pursued as clinically warranted. 2. Right middle lobe subsolid 2.4 cm pulmonary nodule with 0.7 cm solid component and distortion of the adjacent minor fissure, suspicious for synchronous primary bronchogenic adenocarcinoma. 3. Two additional tiny solid pulmonary nodules, which warrant attention on follow-up chest CT in 3-6 months. 4. Mild left infrahilar adenopathy suspicious for nodal metastasis. No mediastinal or supraclavicular adenopathy. 5. Subcentimeter hypodense right liver lobe lesion, too small to characterize, which warrants attention on follow-up MRI (preferred) or CT abdomen without and with IV contrast in 3-6 months. This recommendation follows ACR consensus guidelines: Managing Incidental Findings on Abdominal CT: White Paper of the ACR Incidental Findings Committee. J Am Coll Radiol 2010;7:754-773. 6. Two vessel coronary atherosclerosis.  Aortic Atherosclerosis (ICD10-I70.0).  These results will be called to the ordering clinician or representative by the Radiologist Assistant, and communication documented in the PACS or zVision Dashboard.   Electronically Signed   By: Ilona Sorrel M.D.   On: 10/11/2017 17:28 NUCLEAR MEDICINE PET SKULL BASE TO THIGH  TECHNIQUE: 6.9 mCi F-18 FDG was injected intravenously. Full-ring PET imaging was performed from the skull base to thigh after the  radiotracer. CT data was obtained and used for attenuation correction and anatomic localization.  FASTING BLOOD GLUCOSE:  Value: 99 mg/dl  COMPARISON:  CT chest 10/11/2017.  FINDINGS: NECK: No hypermetabolic lymph nodes in the neck.  CHEST: 6 cm central left lower lobe pulmonary mass is markedly hypermetabolic with SUV max = 60.4 and extends into the left hilum.  The sub solid 2.4 cm pulmonary nodule in the right middle lobe also shows FDG accumulation with SUV max = 1.9.  No  evidence for hypermetabolic mediastinal or right hilar lymphadenopathy.  ABDOMEN/PELVIS: No abnormal hypermetabolic activity within the liver, pancreas, adrenal glands, or spleen. No hypermetabolic lymph nodes in the abdomen or pelvis.  Focal hypermetabolism identified in the right colon, at upper portion compare to background FDG uptake in the bowel.  Tiny layering gallstones evident. There is abdominal aortic atherosclerosis without aneurysm.  SKELETON: Focal hypermetabolic FDG accumulation is identified in the anterior aspect of the L2 vertebral body without underlying lesion evident on CT imaging. This is in a region of degenerative change.  IMPRESSION: 1. Central left lower lobe pulmonary mass is markedly hypermetabolic consistent with neoplasm. 2. Sub solid 2.4 cm pulmonary lesion in the right middle lobe also shows FDG uptake, concerning for neoplasm such as adenocarcinoma. 3. Focal hypermetabolism in the right colon without underlying gross mass lesion evident on CT imaging. This can be seen in the setting of adenoma or neoplasm. 4. Tiny focus of FDG accumulation in the anterior aspect of the L2 vertebral body, potentially degenerative. Close attention on follow-up recommended. 5. Cholelithiasis. 6.  Aortic Atherosclerois (ICD10-170.0)   Electronically Signed   By: Misty Stanley M.D.   On: 10/20/2017 14:15  Impression: [name] French is an 83 year old woman with a prior  history of breast cancer and a remote history of tobacco use who presented with hemoptysis.  She has been found to have a 6 cm left lower lobe lung mass with a significant endobronchial component and postobstructive atelectasis.  This is markedly hypermetabolic on PET CT and is highly suspicious for a new primary bronchogenic carcinoma.  She also has a mixed groundglass/solid nodule in the right middle lobe that was also hypermetabolic.  This is highly suspicious for a second primary.  She needs a tissue diagnosis to guide therapy.  The left lower lobe mass does not appear to be resectable.  That is particularly true given her age.  I recommend that we proceed with a navigational bronchoscopy.  We should be able to access the left lower lobe mass with direct vision with the scope.  But navigational bronchoscopy will allow [place] to also sample the right middle lobe lesion.  I described the general nature of the procedure to her.  She understands this would be done in the operating room under general anesthesia.  It is an endoscopic procedure.  We will plan to do it on an outpatient basis.  I discussed the indications, risks, benefits, and alternatives.  She understands the risks include, but not limited to death, MI, stroke, DVT, PE, bleeding, pneumothorax, failure to make a diagnosis, as well as the possibility of other unforeseeable complications.  She accepts the risks and wishes to proceed.  Plan: Navigational bronchoscopy for biopsy of left lower lobe and right middle lobe lung lesions on Wednesday, 11/01/2017.  (She prefers to wait until that time so that her daughter will be back at the time of the procedure.)  [name], MD [facility] [phone]

## 2017-10-26 ENCOUNTER — Encounter: Payer: Self-pay | Admitting: Hematology and Oncology

## 2017-10-26 ENCOUNTER — Telehealth: Payer: Self-pay | Admitting: Hematology and Oncology

## 2017-10-26 ENCOUNTER — Ambulatory Visit (HOSPITAL_BASED_OUTPATIENT_CLINIC_OR_DEPARTMENT_OTHER): Payer: Medicare Other | Admitting: Hematology and Oncology

## 2017-10-26 DIAGNOSIS — C50211 Malignant neoplasm of upper-inner quadrant of right female breast: Secondary | ICD-10-CM

## 2017-10-26 DIAGNOSIS — J90 Pleural effusion, not elsewhere classified: Secondary | ICD-10-CM

## 2017-10-26 DIAGNOSIS — R918 Other nonspecific abnormal finding of lung field: Secondary | ICD-10-CM

## 2017-10-26 NOTE — Assessment & Plan Note (Signed)
MRI brain 10/20/2017: No evidence of metastatic disease, right inferior parietal meningioma 2.9 cm  PET/CT scan 10/20/2017: Central left lower lobe mass markedly hypermetabolic,  2.4 centimeter lung lesion also at avid, right: Hypermetabolism, L2 hypermetabolic activity potentially degenerative, cholelithiasis  Plan: 1.  Cardiothoracic surgery evaluation to obtain a biopsy 2. subsequent treatment strategy based upon the biopsy results 3.  Recommended colonoscopy to evaluate the colon lesion

## 2017-10-26 NOTE — Telephone Encounter (Signed)
Gave patient avs and calendar with appt per 11/8 los.

## 2017-10-26 NOTE — Progress Notes (Signed)
Patient Care Team: Tisovec, Fransico Him, MD as PCP - General (Internal Medicine) Marcy Panning, MD as Consulting Physician (Oncology)  DIAGNOSIS:  Encounter Diagnosis  Name Primary?  . Lung mass     SUMMARY OF ONCOLOGIC HISTORY:   Breast cancer of upper-inner quadrant of right female breast ([place])   05/08/2013 Initial Biopsy    Right: grade 1-2 invasive mammary carcinoma ER positive PR positive HER-2/neu negative with Ki-67 30% (MRI 2.8 cm)second smaller mass together 3.8cm      05/11/2013 - 11/04/2013 Anti-estrogen oral therapy    Letrozole 2.5 mg Neoadjuvant anti-estrogen therapy      11/06/2013 Surgery    Bilateral Lumpectomies: Left: sclerosing lesion with ALH fibrocystic changes with microcalcifications. Right: Grade 1 ILC 2.7 cm with LCIS       Radiation Therapy    Patient declined      11/19/2013 - 06/09/2015 Anti-estrogen oral therapy    Letrozole 2.5 mg (stopped for arthralgias and myalgias and fatigue accompanied with hair loss)       CHIEF COMPLIANT: Follow-up to discuss the PET/CT scan  INTERVAL HISTORY: [name] French is a 83 year old lady with above-mentioned history of right breast cancer who was noted to have lung masses on CT scan.  She underwent a PET/CT scan which revealed a very large 6 cm hypermetabolic left lung lesion and a 2.5 cm low level of hypermetabolic activity in the right lower lobe lesion in addition to left pleural effusion.  She was seen by Dr. Roxan Hockey who is planning to do bronchoscopy next Wednesday.  Patient does not report any symptoms related to these lung masses.  REVIEW OF SYSTEMS:   Constitutional: Denies fevers, chills or abnormal weight loss Eyes: Denies blurriness of vision Ears, nose, mouth, throat, and face: Denies mucositis or sore throat Respiratory: Denies cough, dyspnea or wheezes Cardiovascular: Denies palpitation, chest discomfort Gastrointestinal:  Denies nausea, heartburn or change in bowel habits Skin: Denies  abnormal skin rashes Lymphatics: Denies new lymphadenopathy or easy bruising Neurological:Denies numbness, tingling or new weaknesses Behavioral/Psych: Mood is stable, no new changes  Extremities: No lower extremity edema  All other systems were reviewed with the patient and are negative.  I have reviewed the past medical history, past surgical history, social history and family history with the patient and they are unchanged from previous note.  ALLERGIES:  is allergic to hydrocodone; codeine; and neosporin [neomycin-bacitracin zn-polymyx].  MEDICATIONS:  Current Outpatient Medications  Medication Sig Dispense Refill  . Ascorbic Acid (VITAMIN C CR) 1000 MG TBCR Take 1 tablet (1,000 mg total) by mouth daily. 30 each 0  . aspirin 81 MG tablet Take 81 mg by mouth daily.    . cetirizine (ZYRTEC) 10 MG tablet Take 10 mg as needed by mouth for allergies.    . Cholecalciferol (VITAMIN D PO) Take 1 tablet by mouth 2 (two) times daily.     . Cholecalciferol (VITAMIN D-3) 1000 UNITS CAPS Take 1 capsule by mouth daily.    . DiphenhydrAMINE HCl, Sleep, (UNISOM SLEEPMELTS) 25 MG TBDP Take 1 tablet (25 mg total) by mouth daily. 28 each   . Glucosamine Sulfate 1000 MG TABS Take 1 tablet (1,000 mg total) by mouth daily.  0  . Loperamide HCl (IMODIUM PO) Take 1 tablet by mouth as needed.      . pravastatin (PRAVACHOL) 40 MG tablet     . Probiotic Product (ALIGN PO) Take 1 tablet by mouth daily.      . SUPER B COMPLEX &  C TABS Take 1 tablet by mouth daily.  0  . zolpidem (AMBIEN) 5 MG tablet   2   No current facility-administered medications for this visit.     PHYSICAL EXAMINATION: ECOG PERFORMANCE STATUS: 1 - Symptomatic but completely ambulatory  Vitals:   10/26/17 0948  BP: (!) 141/54  Pulse: 95  Resp: 16  Temp: 97.6 F (36.4 C)  SpO2: 97%   Filed Weights   10/26/17 0948  Weight: 137 lb 6.4 oz (62.3 kg)    GENERAL:alert, no distress and comfortable SKIN: skin color, texture,  turgor are normal, no rashes or significant lesions EYES: normal, Conjunctiva are pink and non-injected, sclera clear OROPHARYNX:no exudate, no erythema and lips, buccal mucosa, and tongue normal  NECK: supple, thyroid normal size, non-tender, without nodularity LYMPH:  no palpable lymphadenopathy in the cervical, axillary or inguinal LUNGS: clear to auscultation and percussion with normal breathing effort HEART: regular rate & rhythm and no murmurs and no lower extremity edema ABDOMEN:abdomen soft, non-tender and normal bowel sounds MUSCULOSKELETAL:no cyanosis of digits and no clubbing  NEURO: alert & oriented x 3 with fluent speech, no focal motor/sensory deficits EXTREMITIES: No lower extremity edema  LABORATORY DATA:  I have reviewed the data as listed   Chemistry      Component Value Date/Time   NA 141 04/13/2015 0946   K 4.3 04/13/2015 0946   CL 104 06/10/2013 1246   CO2 25 04/13/2015 0946   BUN 12.9 04/13/2015 0946   CREATININE 0.70 10/20/2017 1233   CREATININE 0.8 04/13/2015 0946      Component Value Date/Time   CALCIUM 9.5 04/13/2015 0946   ALKPHOS 54 04/13/2015 0946   AST 11 04/13/2015 0946   ALT 10 04/13/2015 0946   BILITOT 0.53 04/13/2015 0946       Lab Results  Component Value Date   WBC 5.4 04/13/2015   HGB 14.4 04/13/2015   HCT 44.2 04/13/2015   MCV 90.2 04/13/2015   PLT 227 04/13/2015   NEUTROABS 2.9 04/13/2015    ASSESSMENT & PLAN:  Lung mass MRI brain 10/20/2017: No evidence of metastatic disease, right inferior parietal meningioma 2.9 cm  PET/CT scan 10/20/2017: Central left lower lobe mass markedly hypermetabolic,  2.4 centimeter lung lesion also at avid, right: Hypermetabolism, L2 hypermetabolic activity potentially degenerative, cholelithiasis  Plan: 1.  Cardiothoracic surgery evaluation to obtain a biopsy being planned for next Wednesday 2. subsequent treatment strategy based upon the biopsy results and foundation one testing 3.  Recommended  colonoscopy to evaluate the colon lesion  It is very possible that the patient may have 2 separate cancers. Even though she appears to be physically healthy, given her advanced age, her treatment options are limited.  Possible options include palliative radiation to the left lung mass And systemic therapy depending on any molecular mutation results. Patient was interested in immunotherapy.  We will discuss those options when she returns back after the biopsy.  I spent 25 minutes talking to the patient of which more than half was spent in counseling and coordination of care.  No orders of the defined types were placed in this encounter.  The patient has a good understanding of the overall plan. she agrees with it. she will call with any problems that may develop before the next visit here.   Rulon Eisenmenger, MD 10/26/17

## 2017-10-30 ENCOUNTER — Other Ambulatory Visit: Payer: Self-pay

## 2017-10-30 DIAGNOSIS — R918 Other nonspecific abnormal finding of lung field: Secondary | ICD-10-CM

## 2017-10-31 ENCOUNTER — Encounter (HOSPITAL_COMMUNITY): Payer: Self-pay | Admitting: *Deleted

## 2017-10-31 ENCOUNTER — Other Ambulatory Visit: Payer: Self-pay

## 2017-10-31 NOTE — Progress Notes (Signed)
Spoke with pt for pre-op call. Pt denies cardiac history, chest pain, sob or diabetes. 

## 2017-11-01 ENCOUNTER — Ambulatory Visit (HOSPITAL_COMMUNITY): Payer: Medicare Other

## 2017-11-01 ENCOUNTER — Ambulatory Visit (HOSPITAL_COMMUNITY)
Admission: RE | Admit: 2017-11-01 | Discharge: 2017-11-01 | Disposition: A | Discharge status: Home / Self Care | Payer: Medicare Other | Source: Ambulatory Visit | Attending: Thoracic Surgery (Cardiothoracic Vascular Surgery) | Admitting: Thoracic Surgery (Cardiothoracic Vascular Surgery)

## 2017-11-01 ENCOUNTER — Encounter (HOSPITAL_COMMUNITY)
Admission: RE | Disposition: A | Discharge status: Home / Self Care | Payer: Self-pay | Source: Ambulatory Visit | Attending: Thoracic Surgery (Cardiothoracic Vascular Surgery)

## 2017-11-01 ENCOUNTER — Other Ambulatory Visit (HOSPITAL_COMMUNITY)
Admission: RE | Admit: 2017-11-01 | Discharge: 2017-11-01 | Disposition: A | Discharge status: Home / Self Care | Payer: Medicare Other | Source: Ambulatory Visit | Attending: Hematology and Oncology | Admitting: Hematology and Oncology

## 2017-11-01 ENCOUNTER — Encounter (HOSPITAL_COMMUNITY): Payer: Self-pay | Admitting: *Deleted

## 2017-11-01 ENCOUNTER — Ambulatory Visit (HOSPITAL_COMMUNITY): Payer: Medicare Other | Admitting: Certified Registered"

## 2017-11-01 DIAGNOSIS — Z8719 Personal history of other diseases of the digestive system: Secondary | ICD-10-CM | POA: Insufficient documentation

## 2017-11-01 DIAGNOSIS — L719 Rosacea, unspecified: Secondary | ICD-10-CM | POA: Diagnosis not present

## 2017-11-01 DIAGNOSIS — I700 Atherosclerosis of aorta: Secondary | ICD-10-CM | POA: Diagnosis not present

## 2017-11-01 DIAGNOSIS — R05 Cough: Secondary | ICD-10-CM | POA: Diagnosis not present

## 2017-11-01 DIAGNOSIS — K8020 Calculus of gallbladder without cholecystitis without obstruction: Secondary | ICD-10-CM | POA: Diagnosis not present

## 2017-11-01 DIAGNOSIS — Z7982 Long term (current) use of aspirin: Secondary | ICD-10-CM | POA: Diagnosis not present

## 2017-11-01 DIAGNOSIS — Z885 Allergy status to narcotic agent status: Secondary | ICD-10-CM | POA: Insufficient documentation

## 2017-11-01 DIAGNOSIS — Z419 Encounter for procedure for purposes other than remedying health state, unspecified: Secondary | ICD-10-CM

## 2017-11-01 DIAGNOSIS — M1990 Unspecified osteoarthritis, unspecified site: Secondary | ICD-10-CM | POA: Diagnosis not present

## 2017-11-01 DIAGNOSIS — J9811 Atelectasis: Secondary | ICD-10-CM | POA: Diagnosis not present

## 2017-11-01 DIAGNOSIS — C50211 Malignant neoplasm of upper-inner quadrant of right female breast: Secondary | ICD-10-CM | POA: Diagnosis not present

## 2017-11-01 DIAGNOSIS — Z853 Personal history of malignant neoplasm of breast: Secondary | ICD-10-CM | POA: Insufficient documentation

## 2017-11-01 DIAGNOSIS — Z87891 Personal history of nicotine dependence: Secondary | ICD-10-CM | POA: Diagnosis not present

## 2017-11-01 DIAGNOSIS — Z86010 Personal history of colonic polyps: Secondary | ICD-10-CM | POA: Diagnosis not present

## 2017-11-01 DIAGNOSIS — M47814 Spondylosis without myelopathy or radiculopathy, thoracic region: Secondary | ICD-10-CM | POA: Diagnosis not present

## 2017-11-01 DIAGNOSIS — Z79899 Other long term (current) drug therapy: Secondary | ICD-10-CM | POA: Insufficient documentation

## 2017-11-01 DIAGNOSIS — C342 Malignant neoplasm of middle lobe, bronchus or lung: Secondary | ICD-10-CM | POA: Insufficient documentation

## 2017-11-01 DIAGNOSIS — Z9889 Other specified postprocedural states: Secondary | ICD-10-CM

## 2017-11-01 DIAGNOSIS — R846 Abnormal cytological findings in specimens from respiratory organs and thorax: Secondary | ICD-10-CM | POA: Diagnosis not present

## 2017-11-01 DIAGNOSIS — M810 Age-related osteoporosis without current pathological fracture: Secondary | ICD-10-CM | POA: Insufficient documentation

## 2017-11-01 DIAGNOSIS — R918 Other nonspecific abnormal finding of lung field: Secondary | ICD-10-CM | POA: Diagnosis present

## 2017-11-01 DIAGNOSIS — R911 Solitary pulmonary nodule: Secondary | ICD-10-CM | POA: Diagnosis not present

## 2017-11-01 DIAGNOSIS — R0602 Shortness of breath: Secondary | ICD-10-CM | POA: Diagnosis not present

## 2017-11-01 DIAGNOSIS — Z888 Allergy status to other drugs, medicaments and biological substances status: Secondary | ICD-10-CM | POA: Diagnosis not present

## 2017-11-01 DIAGNOSIS — Z01818 Encounter for other preprocedural examination: Secondary | ICD-10-CM

## 2017-11-01 DIAGNOSIS — I2510 Atherosclerotic heart disease of native coronary artery without angina pectoris: Secondary | ICD-10-CM | POA: Diagnosis not present

## 2017-11-01 DIAGNOSIS — C3432 Malignant neoplasm of lower lobe, left bronchus or lung: Secondary | ICD-10-CM | POA: Diagnosis not present

## 2017-11-01 HISTORY — DX: Headache, unspecified: R51.9

## 2017-11-01 HISTORY — DX: Anemia, unspecified: D64.9

## 2017-11-01 HISTORY — DX: Pneumonia, unspecified organism: J18.9

## 2017-11-01 HISTORY — PX: VIDEO BRONCHOSCOPY WITH ENDOBRONCHIAL NAVIGATION: SHX6175

## 2017-11-01 HISTORY — DX: Headache: R51

## 2017-11-01 LAB — CBC
HEMATOCRIT: 41.9 % (ref 36.0–46.0)
HEMOGLOBIN: 13.8 g/dL (ref 12.0–15.0)
MCH: 28.3 pg (ref 26.0–34.0)
MCHC: 32.9 g/dL (ref 30.0–36.0)
MCV: 86 fL (ref 78.0–100.0)
Platelets: 264 10*3/uL (ref 150–400)
RBC: 4.87 MIL/uL (ref 3.87–5.11)
RDW: 15 % (ref 11.5–15.5)
WBC: 8 10*3/uL (ref 4.0–10.5)

## 2017-11-01 LAB — PROTIME-INR
INR: 0.96
PROTHROMBIN TIME: 12.7 s (ref 11.4–15.2)

## 2017-11-01 LAB — COMPREHENSIVE METABOLIC PANEL
ALBUMIN: 4 g/dL (ref 3.5–5.0)
ALT: 12 U/L — AB (ref 14–54)
ANION GAP: 8 (ref 5–15)
AST: 19 U/L (ref 15–41)
Alkaline Phosphatase: 63 U/L (ref 38–126)
BILIRUBIN TOTAL: 0.8 mg/dL (ref 0.3–1.2)
BUN: 10 mg/dL (ref 6–20)
CALCIUM: 9.8 mg/dL (ref 8.9–10.3)
CO2: 27 mmol/L (ref 22–32)
CREATININE: 0.78 mg/dL (ref 0.44–1.00)
Chloride: 103 mmol/L (ref 101–111)
GFR calc Af Amer: >60 mL/min (ref >60)
GFR calc non Af Amer: >60 mL/min (ref >60)
GLUCOSE: 104 mg/dL — AB (ref 65–99)
Potassium: 3.8 mmol/L (ref 3.5–5.1)
Sodium: 138 mmol/L (ref 135–145)
TOTAL PROTEIN: 7 g/dL (ref 6.5–8.1)

## 2017-11-01 LAB — APTT: APTT: 30 s (ref 24–36)

## 2017-11-01 SURGERY — VIDEO BRONCHOSCOPY WITH ENDOBRONCHIAL NAVIGATION
Anesthesia: General | Site: Chest

## 2017-11-01 MED ORDER — PHENYLEPHRINE 40 MCG/ML (10ML) SYRINGE FOR IV PUSH (FOR BLOOD PRESSURE SUPPORT)
PREFILLED_SYRINGE | INTRAVENOUS | Status: DC | PRN
  Administered 2017-11-01: 120 ug via INTRAVENOUS
  Administered 2017-11-01: 80 ug via INTRAVENOUS

## 2017-11-01 MED ORDER — PROPOFOL 10 MG/ML IV BOLUS
INTRAVENOUS | Status: DC | PRN
  Administered 2017-11-01: 110 mg via INTRAVENOUS

## 2017-11-01 MED ORDER — LACTATED RINGERS IV SOLN
INTRAVENOUS | Status: DC
  Administered 2017-11-01: 13:00:00 via INTRAVENOUS

## 2017-11-01 MED ORDER — DEXAMETHASONE SODIUM PHOSPHATE 10 MG/ML IJ SOLN
INTRAMUSCULAR | Status: DC | PRN
  Administered 2017-11-01: 6 mg via INTRAVENOUS

## 2017-11-01 MED ORDER — ONDANSETRON HCL 4 MG/2ML IJ SOLN
INTRAMUSCULAR | Status: AC
  Filled 2017-11-01: qty 2

## 2017-11-01 MED ORDER — LIDOCAINE 2% (20 MG/ML) 5 ML SYRINGE
INTRAMUSCULAR | Status: DC | PRN
  Administered 2017-11-01: 60 mg via INTRAVENOUS

## 2017-11-01 MED ORDER — ROCURONIUM BROMIDE 10 MG/ML (PF) SYRINGE
PREFILLED_SYRINGE | INTRAVENOUS | Status: AC
  Filled 2017-11-01: qty 10

## 2017-11-01 MED ORDER — FENTANYL CITRATE (PF) 250 MCG/5ML IJ SOLN
INTRAMUSCULAR | Status: DC | PRN
  Administered 2017-11-01: 100 ug via INTRAVENOUS
  Administered 2017-11-01: 50 ug via INTRAVENOUS

## 2017-11-01 MED ORDER — DEXAMETHASONE SODIUM PHOSPHATE 10 MG/ML IJ SOLN
INTRAMUSCULAR | Status: AC
  Filled 2017-11-01: qty 1

## 2017-11-01 MED ORDER — ROCURONIUM BROMIDE 10 MG/ML (PF) SYRINGE
PREFILLED_SYRINGE | INTRAVENOUS | Status: DC | PRN
  Administered 2017-11-01: 40 mg via INTRAVENOUS
  Administered 2017-11-01: 10 mg via INTRAVENOUS

## 2017-11-01 MED ORDER — 0.9 % SODIUM CHLORIDE (POUR BTL) OPTIME
TOPICAL | Status: DC | PRN
  Administered 2017-11-01: 1000 mL

## 2017-11-01 MED ORDER — EPINEPHRINE PF 1 MG/ML IJ SOLN
INTRAMUSCULAR | Status: DC | PRN
  Administered 2017-11-01: 1 mg via ENDOTRACHEOPULMONARY

## 2017-11-01 MED ORDER — EPINEPHRINE PF 1 MG/ML IJ SOLN
INTRAMUSCULAR | Status: AC
  Filled 2017-11-01: qty 1

## 2017-11-01 MED ORDER — SUGAMMADEX SODIUM 200 MG/2ML IV SOLN
INTRAVENOUS | Status: DC | PRN
  Administered 2017-11-01: 150 mg via INTRAVENOUS

## 2017-11-01 MED ORDER — ONDANSETRON HCL 4 MG/2ML IJ SOLN
INTRAMUSCULAR | Status: DC | PRN
  Administered 2017-11-01: 4 mg via INTRAVENOUS

## 2017-11-01 MED ORDER — PHENYLEPHRINE HCL 10 MG/ML IJ SOLN
INTRAMUSCULAR | Status: DC | PRN
  Administered 2017-11-01: 30 ug/min via INTRAVENOUS

## 2017-11-01 MED ORDER — SUGAMMADEX SODIUM 500 MG/5ML IV SOLN
INTRAVENOUS | Status: AC
  Filled 2017-11-01: qty 5

## 2017-11-01 MED ORDER — FENTANYL CITRATE (PF) 250 MCG/5ML IJ SOLN
INTRAMUSCULAR | Status: AC
  Filled 2017-11-01: qty 5

## 2017-11-01 SURGICAL SUPPLY — 42 items
ADAPTER BRONCH F/PENTAX (ADAPTER) ×3 IMPLANT
ADPR BSCP EDG PNTX (ADAPTER) ×1
BRUSH BIOPSY BRONCH 10 SDTNB (MISCELLANEOUS) IMPLANT
BRUSH BIOPSY BRONCH 10MM SDTNB (MISCELLANEOUS)
BRUSH CYTOL CELLEBRITY 1.5X140 (MISCELLANEOUS) ×2 IMPLANT
BRUSH SUPERTRAX BIOPSY (INSTRUMENTS) IMPLANT
BRUSH SUPERTRAX NDL-TIP CYTO (INSTRUMENTS) ×5 IMPLANT
CANISTER SUCT 3000ML PPV (MISCELLANEOUS) ×3 IMPLANT
CHANNEL WORK EXTEND EDGE 180 (KITS) IMPLANT
CHANNEL WORK EXTEND EDGE 45 (KITS) IMPLANT
CHANNEL WORK EXTEND EDGE 90 (KITS) IMPLANT
CONT SPEC 4OZ CLIKSEAL STRL BL (MISCELLANEOUS) ×6 IMPLANT
COVER BACK TABLE 60X90IN (DRAPES) ×3 IMPLANT
FILTER STRAW FLUID ASPIR (MISCELLANEOUS) IMPLANT
FORCEPS BIOP RJ4 1.8 (CUTTING FORCEPS) ×2 IMPLANT
FORCEPS BIOP SUPERTRX PREMAR (INSTRUMENTS) ×2 IMPLANT
GAUZE SPONGE 4X4 12PLY STRL (GAUZE/BANDAGES/DRESSINGS) ×3 IMPLANT
GLOVE SURG SIGNA 7.5 PF LTX (GLOVE) ×3 IMPLANT
GOWN STRL REUS W/ TWL XL LVL3 (GOWN DISPOSABLE) ×1 IMPLANT
GOWN STRL REUS W/TWL XL LVL3 (GOWN DISPOSABLE) ×3
KIT CLEAN ENDO COMPLIANCE (KITS) ×3 IMPLANT
KIT PROCEDURE EDGE 180 (KITS) ×2 IMPLANT
KIT PROCEDURE EDGE 45 (KITS) IMPLANT
KIT PROCEDURE EDGE 90 (KITS) IMPLANT
KIT ROOM TURNOVER OR (KITS) ×3 IMPLANT
MARKER SKIN DUAL TIP RULER LAB (MISCELLANEOUS) ×3 IMPLANT
NDL SUPERTRX PREMARK BIOPSY (NEEDLE) IMPLANT
NEEDLE SUPERTRX PREMARK BIOPSY (NEEDLE) ×3 IMPLANT
NS IRRIG 1000ML POUR BTL (IV SOLUTION) ×3 IMPLANT
OIL SILICONE PENTAX (PARTS (SERVICE/REPAIRS)) ×3 IMPLANT
PAD ARMBOARD 7.5X6 YLW CONV (MISCELLANEOUS) ×6 IMPLANT
PATCHES PATIENT (LABEL) ×9 IMPLANT
SYR 20CC LL (SYRINGE) ×3 IMPLANT
SYR 20ML ECCENTRIC (SYRINGE) ×3 IMPLANT
SYR 30ML LL (SYRINGE) ×3 IMPLANT
SYR 5ML LL (SYRINGE) ×3 IMPLANT
TOWEL GREEN STERILE FF (TOWEL DISPOSABLE) ×3 IMPLANT
TRAP SPECIMEN MUCOUS 40CC (MISCELLANEOUS) ×3 IMPLANT
TUBE CONNECTING 20'X1/4 (TUBING) ×2
TUBE CONNECTING 20X1/4 (TUBING) ×4 IMPLANT
UNDERPAD 30X30 (UNDERPADS AND DIAPERS) ×3 IMPLANT
WATER STERILE IRR 1000ML POUR (IV SOLUTION) ×3 IMPLANT

## 2017-11-01 NOTE — Brief Op Note (Signed)
11/01/2017  5:10 PM  PATIENT:  [name] French  83 y.o. female  PRE-OPERATIVE DIAGNOSIS:  Right middle lobe nodule, Left lower lobe mass  POST-OPERATIVE DIAGNOSIS:  Right middle lobe nodule, Left lower lobe mass  PROCEDURE:  Procedure(s): VIDEO BRONCHOSCOPY WITH ENDOBRONCHIAL NAVIGATION (N/A) Needle aspirations, brushings and transbronchial biopsies of RML nodule Brushings and endobronchial biopsies of LLL mass  SURGEON:  Surgeon(s) and Role:    [name], MD - Primary  PHYSICIAN ASSISTANT:   ASSISTANTS: none   ANESTHESIA:   general  EBL:  50 mL   BLOOD ADMINISTERED:none  DRAINS: none   LOCAL MEDICATIONS USED:  NONE  SPECIMEN:  Source of Specimen:  1. RML 2. LLL  DISPOSITION OF SPECIMEN:  PATHOLOGY  COUNTS:  NO endoscopic  TOURNIQUET:  * No tourniquets in log *  DICTATION: .Other Dictation: Dictation Number -  PLAN OF CARE: Discharge to home after PACU  PATIENT DISPOSITION:  PACU - hemodynamically stable.   Delay start of Pharmacological VTE agent (>24hrs) due to surgical blood loss or risk of bleeding: not applicable

## 2017-11-01 NOTE — Discharge Instructions (Signed)
Do not drive or engage in heavy physical activity for 24 hours  You may resume normal activities tomorrow  You may cough up small amounts of blood over the next few days  You may use acetaminophen (Tylenol) if needed for minor discomfort. You may use an over the counter cough medication if needed  Follow up with Dr. Lindi Adie as scheduled  Call [phone] if you develop chest pain, shortness of breath, fever > 101 or cough up more than 2 tablespoons of blood

## 2017-11-01 NOTE — Transfer of Care (Signed)
Immediate Anesthesia Transfer of Care Note  Patient: [name] French  Procedure(s) Performed: VIDEO BRONCHOSCOPY WITH ENDOBRONCHIAL NAVIGATION (N/A Chest)  Patient Location: PACU  Anesthesia Type:General  Level of Consciousness: awake, alert , oriented and patient cooperative  Airway & Oxygen Therapy: Patient Spontanous Breathing and Patient connected to nasal cannula oxygen  Post-op Assessment: Report given to RN, Post -op Vital signs reviewed and stable and Patient moving all extremities  Post vital signs: Reviewed and stable  Last Vitals:  Vitals:   11/01/17 1142  BP: (!) 162/63  Pulse: 92  Resp: 18  Temp: 36.6 C  SpO2: 97%    Last Pain:  Vitals:   11/01/17 1142  TempSrc: Oral      Patients Stated Pain Goal: 4 (24/81/85 [id number])  Complications: No apparent anesthesia complications

## 2017-11-01 NOTE — Anesthesia Procedure Notes (Signed)
Procedure Name: Intubation Date/Time: 11/01/2017 3:53 PM Performed by: Myna Bright, CRNA Pre-anesthesia Checklist: Patient identified, Emergency Drugs available, Suction available and Patient being monitored Patient Re-evaluated:Patient Re-evaluated prior to induction Oxygen Delivery Method: Circle system utilized Preoxygenation: Pre-oxygenation with 100% oxygen Induction Type: IV induction Ventilation: Mask ventilation without difficulty Laryngoscope Size: Mac and 3 Grade View: Grade II Tube type: Oral Tube size: 8.5 mm Number of attempts: 1 Airway Equipment and Method: Stylet Placement Confirmation: ETT inserted through vocal cords under direct vision,  breath sounds checked- equal and bilateral and positive ETCO2 Secured at: 21 cm Tube secured with: Tape Dental Injury: Teeth and Oropharynx as per pre-operative assessment

## 2017-11-01 NOTE — Interval H&P Note (Signed)
History and Physical Interval Note:  11/01/2017 2:39 PM  [name] French  has presented today for surgery, with the diagnosis of lung nodules  The various methods of treatment have been discussed with the patient and family. After consideration of risks, benefits and other options for treatment, the patient has consented to  Procedure(s): [place] (N/A) as a surgical intervention .  The patient's history has been reviewed, patient examined, no change in status, stable for surgery.  I have reviewed the patient's chart and labs.  Questions were answered to the patient's satisfaction.     [name]

## 2017-11-01 NOTE — Op Note (Addendum)
NAMEAsencion Gowda               ACCOUNT NO.:  [id number]  MEDICAL RECORD NO.:  [id number]  LOCATION:                                 FACILITY:  PHYSICIAN:  Revonda Standard. Roxan Hockey, M.D. DATE OF BIRTH:  DATE OF PROCEDURE:  11/01/2017 DATE OF DISCHARGE:                              OPERATIVE REPORT   PREOPERATIVE DIAGNOSES:  Left lower lobe lung mass, right middle lobe lung nodule.  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  SURGEON:  Revonda Standard. Roxan Hockey, MD.  ASSISTANT:  None.  ANESTHESIA:  General.  FINDINGS:  Visible endobronchial mass and the basilar segmental bronchus of the left lower lobe bled extensively with biopsies.  Brushings showed malignancy.  Right middle lobe mass brushings also showed malignancy.  CLINICAL NOTE:  Mrs. Roffin is an 83 year old woman with a history of breast cancer and remote tobacco abuse.  She quit over 35 years ago. She was in her usual state of health until a month ago when she developed a dry cough.  She then developed hemoptysis.  A CT showed a 6- cm mass in the left lower lobe.  There also was a 2.5-cm mixed solid/subsolid nodule in the right middle lobe.  PET-CT showed the left lower lobe mass was hypermetabolic.  There was also some activity in the right middle lobe nodule.  She was advised to undergo navigational bronchoscopy for diagnostic purposes.  The indications, risks, benefits, and alternatives were discussed in detail with the patient.  She understood and accepted the risks and agreed to proceed.  OPERATIVE NOTE:  Mrs. Roffin was brought to the operating room on November 01, 2017.  She had induction of general anesthesia.  A time-out was performed.  Flexible fiberoptic bronchoscopy  was performed via the endotracheal tube.  There was an endobronchial lesion visible in the basilar segmental bronchi of the left lower lobe.  The remainder of the tracheobronchial tree was unremarkable with no endobronchial lesions to the level of subsegmental bronchi. The locatable guide for navigation was performed and initial registration was performed.  There was good correlation of the video and virtual bronchoscopy.  The locatable guide then was navigated within 1.5 cm of the nodule and local navigation was performed.  The locatable guide then was advanced to within 8 mm of the nodule with good alignment.  The locatable guide was removed and sampling was performed. Three needle aspirations were performed.  All sampling was done with fluoroscopy on the right middle lobe nodule due to his peripheral location.  Next, 3 brushings using a needle brush were performed and finally multiple transbronchial biopsies were taken.  Locatable guide was re-inserted periodically to ensure continued proximity and alignment with the lesion.  The sheath was removed.  The bronchoscope then was directed to the left lower lobe bronchus and again there was an endobronchial mass lesion seen in the basilar segmental bronchus essentially occluding that entire area.  The superior segmental bronchus was not involved.  Brushings were performed and with this, there  was immediate bleeding.  Dilute epinephrine was applied topically to help with the bleeding and it did decrease the bleeding, but did not completely eliminate it.  Visualization was impaired with the remaining sampling.  Additional brushings were taken.  The brushings and needle aspirations then were sent to pathology.  Multiple biopsies were taken. Frequent irrigation with saline was performed in addition to repeated installations of dilute epinephrine.  There was no change in the patient's vital signs with administration of the dilute  epinephrine. The brushings from both the left lower lobe mass and the right middle lobe nodule showed malignancy.  No further characterization was possible on the quick preps that will await the final pathology.  Obtaining as many as biopsies was possible due to the bleeding.  Dilute epinephrine was applied a final time.  The scope was removed and after 5 minutes, the scope was reinserted.  There was no ongoing bleeding.  The scope was withdrawn.  The patient was extubated in the operating room and taken to the postanesthetic care unit in good condition.     Revonda Standard Roxan Hockey, M.D.     SCH/MEDQ  D:  11/01/2017  T:  11/01/2017  Job:  [id number]

## 2017-11-02 ENCOUNTER — Encounter (HOSPITAL_COMMUNITY): Payer: Self-pay | Admitting: Thoracic Surgery (Cardiothoracic Vascular Surgery)

## 2017-11-02 NOTE — Anesthesia Preprocedure Evaluation (Signed)
Anesthesia Evaluation  Patient identified by MRN, date of birth, ID band Patient awake    Reviewed: Allergy & Precautions, NPO status , Patient's Chart, lab work & pertinent test results  Airway Mallampati: I  TM Distance: >3 FB Neck ROM: Full    Dental   Pulmonary former smoker,    Pulmonary exam normal        Cardiovascular Normal cardiovascular exam     Neuro/Psych    GI/Hepatic   Endo/Other    Renal/GU      Musculoskeletal   Abdominal   Peds  Hematology   Anesthesia Other Findings   Reproductive/Obstetrics                             Anesthesia Physical Anesthesia Plan  ASA: III  Anesthesia Plan: General   Post-op Pain Management:    Induction: Intravenous  PONV Risk Score and Plan: 3 and Ondansetron, Treatment may vary due to age or medical condition and Dexamethasone  Airway Management Planned: Oral ETT  Additional Equipment:   Intra-op Plan:   Post-operative Plan: Extubation in OR  Informed Consent: I have reviewed the patients History and Physical, chart, labs and discussed the procedure including the risks, benefits and alternatives for the proposed anesthesia with the patient or authorized representative who has indicated his/her understanding and acceptance.     Plan Discussed with: CRNA and Surgeon  Anesthesia Plan Comments:         Anesthesia Quick Evaluation

## 2017-11-02 NOTE — Anesthesia Postprocedure Evaluation (Signed)
Anesthesia Post Note  Patient: [name] French  Procedure(s) Performed: VIDEO BRONCHOSCOPY WITH ENDOBRONCHIAL NAVIGATION (N/A Chest)     Patient location during evaluation: PACU Anesthesia Type: General Level of consciousness: awake and alert Pain management: pain level controlled Vital Signs Assessment: post-procedure vital signs reviewed and stable Respiratory status: spontaneous breathing, nonlabored ventilation, respiratory function stable and patient connected to nasal cannula oxygen Cardiovascular status: blood pressure returned to baseline and stable Postop Assessment: no apparent nausea or vomiting Anesthetic complications: no    Last Vitals:  Vitals:   11/01/17 1745 11/01/17 1757  BP: 137/66 140/69  Pulse: (!) 106 99  Resp: (!) 23 (!) 22  Temp:  36.7 C  SpO2: (!) 89% (!) 89%    Last Pain:  Vitals:   11/01/17 1757  TempSrc:   PainSc: 0-No pain                 Fallan Mccarey DAVID

## 2017-11-06 ENCOUNTER — Other Ambulatory Visit: Payer: Self-pay | Admitting: Hematology and Oncology

## 2017-11-06 ENCOUNTER — Ambulatory Visit
Admission: RE | Admit: 2017-11-06 | Discharge: 2017-11-06 | Disposition: A | Discharge status: Home / Self Care | Payer: Medicare Other | Source: Ambulatory Visit | Attending: Hematology and Oncology | Admitting: Hematology and Oncology

## 2017-11-06 DIAGNOSIS — Z853 Personal history of malignant neoplasm of breast: Secondary | ICD-10-CM

## 2017-11-06 DIAGNOSIS — R922 Inconclusive mammogram: Secondary | ICD-10-CM | POA: Diagnosis not present

## 2017-11-07 ENCOUNTER — Telehealth: Payer: Self-pay | Admitting: Hematology and Oncology

## 2017-11-07 ENCOUNTER — Telehealth: Payer: Self-pay

## 2017-11-07 ENCOUNTER — Ambulatory Visit (HOSPITAL_BASED_OUTPATIENT_CLINIC_OR_DEPARTMENT_OTHER): Payer: Medicare Other | Admitting: Hematology and Oncology

## 2017-11-07 ENCOUNTER — Other Ambulatory Visit: Payer: Self-pay

## 2017-11-07 DIAGNOSIS — C342 Malignant neoplasm of middle lobe, bronchus or lung: Secondary | ICD-10-CM | POA: Diagnosis not present

## 2017-11-07 DIAGNOSIS — C3491 Malignant neoplasm of unspecified part of right bronchus or lung: Secondary | ICD-10-CM

## 2017-11-07 DIAGNOSIS — C3432 Malignant neoplasm of lower lobe, left bronchus or lung: Secondary | ICD-10-CM | POA: Diagnosis not present

## 2017-11-07 NOTE — Telephone Encounter (Signed)
Per 11/20 - No Los at check out

## 2017-11-07 NOTE — Assessment & Plan Note (Signed)
6 cm central left lower lobe pulmonary mass is markedly hypermetabolic with SUV max = 11.6 and extends into the left hilum. The sub solid 2.4 cm pulmonary nodule in the right middle lobe also shows FDG accumulation with SUV max = 1.9. No evidence for hypermetabolic mediastinal or right hilar lymphadenopathy. L2 uptake degenerative.    Staging: LLL:T3N0M0 stage IIb clinical stage; RML: T2N0 Stage 1B  Recommendation: 1.  Referral to radiation 2. standard concurrent chemoradiation might be too toxic for the patient.  However low-dose Taxol and carboplatin could be combined with radiation. 3.  Meningioma will also need to be evaluated by radiation oncology for [facility]

## 2017-11-07 NOTE — Telephone Encounter (Signed)
Called pt to inform her we have her scheduled at Dr. Lynne Leader office on November 29th at 10:30. Pt was in grocery store and could not hear well. I gave her our office number to call back or I would call her back this afternoon. Will follow up. Cyndia Bent RN

## 2017-11-07 NOTE — Progress Notes (Signed)
Patient Care Team: Tisovec, Fransico Him, MD as PCP - General (Internal Medicine) Marcy Panning, MD as Consulting Physician (Oncology)  DIAGNOSIS:  Encounter Diagnosis  Name Primary?  . Non-small cell cancer of right lung ([place])     SUMMARY OF ONCOLOGIC HISTORY:   Breast cancer of upper-inner quadrant of right female breast ([place])   05/08/2013 Initial Biopsy    Right: grade 1-2 invasive mammary carcinoma ER positive PR positive HER-2/neu negative with Ki-67 30% (MRI 2.8 cm)second smaller mass together 3.8cm      05/11/2013 - 11/04/2013 Anti-estrogen oral therapy    Letrozole 2.5 mg Neoadjuvant anti-estrogen therapy      11/06/2013 Surgery    Bilateral Lumpectomies: Left: sclerosing lesion with ALH fibrocystic changes with microcalcifications. Right: Grade 1 ILC 2.7 cm with LCIS       Radiation Therapy    Patient declined      11/19/2013 - 06/09/2015 Anti-estrogen oral therapy    Letrozole 2.5 mg (stopped for arthralgias and myalgias and fatigue accompanied with hair loss)       Non-small cell cancer of right lung ([place])   10/20/2017 PET scan    6 cm central left lower lobe pulmonary mass is markedly hypermetabolic with SUV max = 94.4 and extends into the left hilum.  The sub solid 2.4 cm pulmonary nodule in the right middle lobe also shows FDG accumulation with SUV max = 1.9. No evidence for hypermetabolic mediastinal or right hilar lymphadenopathy. L2 uptake degenerative.  LLL:T3N0M0 stage IIb clinical stage; RML: T2N0 Stage 1B      10/20/2017 Imaging    MRI brain: No metastatic disease, right inferior parietal convexity meningioma 2.9 x 2.7 x 1.4 cm.  This indents the brain and associated with mild brain edema      11/01/2017 Initial Diagnosis    Transbronchial needle aspiration right middle lobe and left lower lobe brushings: Both are positive for malignant cells consistent with non-small cell lung cancer        CHIEF COMPLIANT: Follow-up to discuss the results of  recently performed bronchoscopies and biopsies  INTERVAL HISTORY: ZAINA JENKIN is a 83 year old with above-mentioned history of bilateral lung masses who underwent bilateral bronchoscopies and biopsies and pathology return back as bilateral non-small cell lung cancers.  She is here today to discuss the pathology report and to discuss her treatment plan accompanied by her daughter.  She has remarkably good performance status.  She denies any cough expectoration or shortness of breath or hemoptysis.  She was also noted on a brain MRI to have a meningioma.  REVIEW OF SYSTEMS:   Constitutional: Denies fevers, chills or abnormal weight loss Eyes: Denies blurriness of vision Ears, nose, mouth, throat, and face: Denies mucositis or sore throat Respiratory: Denies cough, dyspnea or wheezes Cardiovascular: Denies palpitation, chest discomfort Gastrointestinal:  Denies nausea, heartburn or change in bowel habits Skin: Denies abnormal skin rashes Lymphatics: Denies new lymphadenopathy or easy bruising Neurological:Denies numbness, tingling or new weaknesses Behavioral/Psych: Mood is stable, no new changes  Extremities: No lower extremity edema  All other systems were reviewed with the patient and are negative.  I have reviewed the past medical history, past surgical history, social history and family history with the patient and they are unchanged from previous note.  ALLERGIES:  is allergic to codeine; hydrocodone; and neosporin [neomycin-bacitracin zn-polymyx].  MEDICATIONS:  Current Outpatient Medications  Medication Sig Dispense Refill  . Artificial Tear Solution (BION TEARS OP) Place 1-2 drops 3 (three) times daily as  needed into both eyes (for dry eyes.).    Marland Kitchen Ascorbic Acid (VITAMIN C) 1000 MG tablet Take 1,000 mg daily by mouth.    Marland Kitchen aspirin EC 81 MG tablet Take 81 mg daily by mouth.    . cetirizine (ZYRTEC) 10 MG tablet Take 10 mg daily by mouth.     . Cholecalciferol (VITAMIN D-3)  1000 UNITS CAPS Take 1,000 Units daily by mouth.     . diclofenac sodium (VOLTAREN) 1 % GEL Apply 2-4 g 4 (four) times daily as needed topically (for shoulder/leg pain.).    Marland Kitchen doxylamine, Sleep, (UNISOM) 25 MG tablet Take 25 mg at bedtime by mouth.    . GNP GLUCOSAMINE SULFATE PO Take 1 tablet daily by mouth.    . loperamide (IMODIUM) 2 MG capsule Take 2-4 mg 3 (three) times daily as needed by mouth for diarrhea or loose stools.    . Menthol, Topical Analgesic, (BIOFREEZE EX) Apply 1-2 application 3 (three) times daily as needed topically (for pain/muscle aches.).    Marland Kitchen naphazoline-pheniramine (NAPHCON-A) 0.025-0.3 % ophthalmic solution Place 1-2 drops 4 (four) times daily as needed into both eyes for eye irritation or allergies.    . Olopatadine HCl (PAZEO) 0.7 % SOLN Place 1-2 drops 3 (three) times daily as needed into both eyes (for allergy eyes).    . pravastatin (PRAVACHOL) 40 MG tablet Take 40 mg daily by mouth.     . Probiotic Product (ALIGN) 4 MG CAPS Take 4 mg daily by mouth.    . SUPER B COMPLEX & C TABS Take 1 tablet by mouth daily.  0  . zolpidem (AMBIEN) 5 MG tablet Take 5 mg at bedtime as needed by mouth (for sleep.).   2   No current facility-administered medications for this visit.     PHYSICAL EXAMINATION: ECOG PERFORMANCE STATUS: 0 - Asymptomatic  Vitals:   11/07/17 0903  BP: (!) 157/72  Pulse: (!) 130  Resp: 20  Temp: 98 F (36.7 C)  SpO2: 95%   Filed Weights   11/07/17 0903  Weight: 134 lb 11.2 oz (61.1 kg)    GENERAL:alert, no distress and comfortable SKIN: skin color, texture, turgor are normal, no rashes or significant lesions EYES: normal, Conjunctiva are pink and non-injected, sclera clear OROPHARYNX:no exudate, no erythema and lips, buccal mucosa, and tongue normal  NECK: supple, thyroid normal size, non-tender, without nodularity LYMPH:  no palpable lymphadenopathy in the cervical, axillary or inguinal LUNGS: clear to auscultation and percussion with  normal breathing effort HEART: regular rate & rhythm and no murmurs and no lower extremity edema ABDOMEN:abdomen soft, non-tender and normal bowel sounds MUSCULOSKELETAL:no cyanosis of digits and no clubbing  NEURO: alert & oriented x 3 with fluent speech, no focal motor/sensory deficits EXTREMITIES: No lower extremity edema  LABORATORY DATA:  I have reviewed the data as listed   Chemistry      Component Value Date/Time   NA 138 11/01/2017 1200   NA 141 04/13/2015 0946   K 3.8 11/01/2017 1200   K 4.3 04/13/2015 0946   CL 103 11/01/2017 1200   CL 104 06/10/2013 1246   CO2 27 11/01/2017 1200   CO2 25 04/13/2015 0946   BUN 10 11/01/2017 1200   BUN 12.9 04/13/2015 0946   CREATININE 0.78 11/01/2017 1200   CREATININE 0.8 04/13/2015 0946      Component Value Date/Time   CALCIUM 9.8 11/01/2017 1200   CALCIUM 9.5 04/13/2015 0946   ALKPHOS 63 11/01/2017 1200   ALKPHOS  54 04/13/2015 0946   AST 19 11/01/2017 1200   AST 11 04/13/2015 0946   ALT 12 (L) 11/01/2017 1200   ALT 10 04/13/2015 0946   BILITOT 0.8 11/01/2017 1200   BILITOT 0.53 04/13/2015 0946       Lab Results  Component Value Date   WBC 8.0 11/01/2017   HGB 13.8 11/01/2017   HCT 41.9 11/01/2017   MCV 86.0 11/01/2017   PLT 264 11/01/2017   NEUTROABS 2.9 04/13/2015    ASSESSMENT & PLAN:  Non-small cell cancer of right lung (H[facility]) 6 cm central left lower lobe pulmonary mass is markedly hypermetabolic with SUV max = 38.4 and extends into the left hilum. The sub solid 2.4 cm pulmonary nodule in the right middle lobe also shows FDG accumulation with SUV max = 1.9. No evidence for hypermetabolic mediastinal or right hilar lymphadenopathy. L2 uptake degenerative.    Staging: LLL:T3N0M0 stage IIb clinical stage; RML: T2N0 Stage 1B It is also possible that the patient may have stage IV lung cancer based upon bilateral lung involvement I would like to send for foundation 1 testing as well as PD1  testing.  Recommendation: 1.  Referral to radiation 2. standard concurrent chemoradiation will be too toxic for the patient. 3.  Meningioma will also need to be evaluated by radiation oncology for [facility] 4.  If she has a molecular abnormality on foundation one then we can put her on oral therapy after completion of radiation. 5.  If she has PD1 greater than 50% then I will consider treating her with Keytruda. 6.  If she has neither a mutation nor PD1 greater than 50% then she will be observed with serial CAT scans.  Return to clinic at the end of radiation to discuss treatment plan. Patient is in complete agreement with the plan.  She understands that radiation alone cannot cure her cancer but it will likely prolong her survival.  It will also help prevent major complications from the lung cancer.  I spent 45 minutes talking to the patient of which more than half was spent in counseling and coordination of care.  Orders Placed This Encounter  Procedures  . Ambulatory referral to Radiation Oncology    Referral Priority:   Routine    Referral Type:   Consultation    Referral Reason:   Specialty Services Required    Referred to Provider:   Tyler Pita, MD    Requested Specialty:   Radiation Oncology    Number of Visits Requested:   1   The patient has a good understanding of the overall plan. she agrees with it. she will call with any problems that may develop before the next visit here.   Rulon Eisenmenger, MD 11/07/17

## 2017-11-07 NOTE — Telephone Encounter (Signed)
PT returned call to verify appt at Dr. Silvio Pate office. Information given. Patient has good understanding.  Cyndia Bent RN

## 2017-11-13 ENCOUNTER — Encounter: Payer: Self-pay | Admitting: Radiation Oncology

## 2017-11-14 ENCOUNTER — Encounter: Payer: Self-pay | Admitting: Radiation Oncology

## 2017-11-14 NOTE — Progress Notes (Addendum)
Thoracic Location of Tumor / Histology: Hx of upper inner quadrant right breast cancer ER + PR+ HER 2 -. Non small cell cancer of  lung dx 10/20/2017. Newly diagnosed meningioma.  Patient presented in October 2018 to her PCP after an episode where she coughed up enough blood to fill four tissues. She has not coughed up blood since. Her PCP began a pneumonia work up with a chest x ray then a CT of the chest.The CT done 10/11/17 revealed a 6 cm LLL lung mass with additional lung nodules including a 2.4 cm pulmonary nodule in the RML as well as 0.3 cm satellite nodules. There was also a hypodense 0.4 cm RLL lesion.   Since CT patient has had a PET scan and a brain MRI.       Tobacco/Marijuana/Snuff/ETOH use: former smoker quit in 1980  Past/Anticipated interventions by cardiothoracic surgery, if any: no  Past/Anticipated interventions by medical oncology, if any:  SUMMARY OF ONCOLOGIC HISTORY:       Breast cancer of upper-inner quadrant of right female breast ([place])   05/08/2013 Initial Biopsy    Right: grade 1-2 invasive mammary carcinoma ER positive PR positive HER-2/neu negative with Ki-67 30% (MRI 2.8 cm)second smaller mass together 3.8cm      05/11/2013 - 11/04/2013 Anti-estrogen oral therapy    Letrozole 2.5 mg Neoadjuvant anti-estrogen therapy      11/06/2013 Surgery    Bilateral Lumpectomies: Left: sclerosing lesion with ALH fibrocystic changes with microcalcifications. Right: Grade 1 ILC 2.7 cm with LCIS       Radiation Therapy    Patient declined      11/19/2013 - 06/09/2015 Anti-estrogen oral therapy    Letrozole 2.5 mg (stopped for arthralgias and myalgias and fatigue accompanied with hair loss)       Non-small cell cancer of right lung ([place])   10/20/2017 PET scan    6 cm central left lower lobe pulmonary mass is markedly hypermetabolic with SUV max = 50.5 and extends into the left hilum.  The sub solid 2.4 cm pulmonary nodule in the right  middle lobe also shows FDG accumulation with SUV max = 1.9. No evidence for hypermetabolic mediastinal or right hilar lymphadenopathy. L2 uptake degenerative.  LLL:T3N0M0 stage IIb clinical stage; RML: T2N0 Stage 1B      10/20/2017 Imaging    MRI brain: No metastatic disease, right inferior parietal convexity meningioma 2.9 x 2.7 x 1.4 cm.  This indents the brain and associated with mild brain edema      11/01/2017 Initial Diagnosis    Transbronchial needle aspiration right middle lobe and left lower lobe brushings: Both are positive for malignant cells consistent with non-small cell lung cancer    Current Recommendations: 1.  Referral to radiation 2. standard concurrent chemoradiation will be too toxic for the patient. 3.  Meningioma will also need to be evaluated by radiation oncology for [facility] 4.  If she has a molecular abnormality on foundation one then we can put her on oral therapy after completion of radiation. 5.  If she has PD1 greater than 50% then I will consider treating her with Keytruda. 6.  If she has neither a mutation nor PD1 greater than 50% then she will be observed with serial CAT scans.  Return to clinic at the end of radiation to discuss treatment plan.  Signs/Symptoms  Weight changes, if any: Reports a poor appetite over the last few weeks thus, a loss of five pounds.  Respiratory complaints, if any: Denies  SOB. Reports a dry cough which she attributes to allergies.  Hemoptysis, if any: no  Pain issues, if any: low back and left leg related to effects of arthritis. Managed with cortisone injections.    SAFETY ISSUES:  Prior radiation? no  Pacemaker/ICD? no   Possible current pregnancy?no  Is the patient on methotrexate? no  Current Complaints / other details:  83 year old female.

## 2017-11-15 ENCOUNTER — Encounter: Payer: Self-pay | Admitting: Radiation Oncology

## 2017-11-15 ENCOUNTER — Ambulatory Visit
Admission: RE | Admit: 2017-11-15 | Discharge: 2017-11-15 | Disposition: A | Discharge status: Home / Self Care | Payer: Medicare Other | Source: Ambulatory Visit

## 2017-11-15 ENCOUNTER — Other Ambulatory Visit: Payer: Self-pay

## 2017-11-15 ENCOUNTER — Ambulatory Visit
Admission: RE | Admit: 2017-11-15 | Discharge: 2017-11-15 | Disposition: A | Discharge status: Home / Self Care | Payer: Medicare Other | Source: Ambulatory Visit | Attending: Radiation Oncology | Admitting: Radiation Oncology

## 2017-11-15 VITALS — BP 145/58 | HR 88 | Temp 98.2°F | Resp 20 | Ht 65.5 in | Wt 135.2 lb

## 2017-11-15 DIAGNOSIS — C342 Malignant neoplasm of middle lobe, bronchus or lung: Secondary | ICD-10-CM | POA: Diagnosis not present

## 2017-11-15 DIAGNOSIS — Z7982 Long term (current) use of aspirin: Secondary | ICD-10-CM | POA: Diagnosis not present

## 2017-11-15 DIAGNOSIS — Z86010 Personal history of colonic polyps: Secondary | ICD-10-CM | POA: Diagnosis not present

## 2017-11-15 DIAGNOSIS — Z853 Personal history of malignant neoplasm of breast: Secondary | ICD-10-CM | POA: Insufficient documentation

## 2017-11-15 DIAGNOSIS — Z9889 Other specified postprocedural states: Secondary | ICD-10-CM | POA: Diagnosis not present

## 2017-11-15 DIAGNOSIS — D320 Benign neoplasm of cerebral meninges: Secondary | ICD-10-CM | POA: Diagnosis not present

## 2017-11-15 DIAGNOSIS — M1990 Unspecified osteoarthritis, unspecified site: Secondary | ICD-10-CM | POA: Insufficient documentation

## 2017-11-15 DIAGNOSIS — Z8042 Family history of malignant neoplasm of prostate: Secondary | ICD-10-CM | POA: Insufficient documentation

## 2017-11-15 DIAGNOSIS — C3491 Malignant neoplasm of unspecified part of right bronchus or lung: Secondary | ICD-10-CM

## 2017-11-15 DIAGNOSIS — Z888 Allergy status to other drugs, medicaments and biological substances status: Secondary | ICD-10-CM | POA: Insufficient documentation

## 2017-11-15 DIAGNOSIS — Z8249 Family history of ischemic heart disease and other diseases of the circulatory system: Secondary | ICD-10-CM | POA: Insufficient documentation

## 2017-11-15 DIAGNOSIS — Z801 Family history of malignant neoplasm of trachea, bronchus and lung: Secondary | ICD-10-CM | POA: Diagnosis not present

## 2017-11-15 DIAGNOSIS — D381 Neoplasm of uncertain behavior of trachea, bronchus and lung: Secondary | ICD-10-CM

## 2017-11-15 DIAGNOSIS — Z510 Encounter for antineoplastic radiation therapy: Secondary | ICD-10-CM | POA: Insufficient documentation

## 2017-11-15 DIAGNOSIS — D332 Benign neoplasm of brain, unspecified: Secondary | ICD-10-CM | POA: Diagnosis not present

## 2017-11-15 DIAGNOSIS — Z79899 Other long term (current) drug therapy: Secondary | ICD-10-CM | POA: Insufficient documentation

## 2017-11-15 DIAGNOSIS — M810 Age-related osteoporosis without current pathological fracture: Secondary | ICD-10-CM | POA: Insufficient documentation

## 2017-11-15 DIAGNOSIS — C3432 Malignant neoplasm of lower lobe, left bronchus or lung: Secondary | ICD-10-CM | POA: Insufficient documentation

## 2017-11-15 DIAGNOSIS — C3492 Malignant neoplasm of unspecified part of left bronchus or lung: Secondary | ICD-10-CM

## 2017-11-15 DIAGNOSIS — Z87891 Personal history of nicotine dependence: Secondary | ICD-10-CM | POA: Diagnosis not present

## 2017-11-15 DIAGNOSIS — Z885 Allergy status to narcotic agent status: Secondary | ICD-10-CM | POA: Diagnosis not present

## 2017-11-15 HISTORY — DX: Benign neoplasm of meninges, unspecified: D32.9

## 2017-11-15 HISTORY — DX: Malignant neoplasm of unspecified part of unspecified bronchus or lung: C34.90

## 2017-11-15 NOTE — Progress Notes (Signed)
Radiation Oncology         [phone] ________________________________  Initial Outpatient Consultation  Name: [name] French MRN: [id number]  Date of Service: 11/15/2017 DOB: 02-May-1926  WU:[id number], [name] Him, MD  [name] Lose, MD   REFERRING PHYSICIAN: Nicholas Lose, MD  DIAGNOSIS: The encounter diagnosis was Neoplasm of uncertain behavior of trachea, bronchus, and lung.    ICD-10-CM   1. Neoplasm of uncertain behavior of trachea, bronchus, and lung D38.1      SUMMARY OF ONCOLOGIC HISTORY:    Breast cancer of upper-inner quadrant of right female breast ([place])   05/08/2013 Initial Biopsy    Right: grade 1-2 invasive mammary carcinoma ER positive PR positive HER-2/neu negative with Ki-67 30% (MRI 2.8 cm)second smaller mass together 3.8cm      05/11/2013 - 11/04/2013 Anti-estrogen oral therapy    Letrozole 2.5 mg Neoadjuvant anti-estrogen therapy      11/06/2013 Surgery    Bilateral Lumpectomies: Left: sclerosing lesion with ALH fibrocystic changes with microcalcifications. Right: Grade 1 ILC 2.7 cm with LCIS       Radiation Therapy    Patient declined      11/19/2013 - 06/09/2015 Anti-estrogen oral therapy    Letrozole 2.5 mg (stopped for arthralgias and myalgias and fatigue accompanied with hair loss)       Non-small cell cancer of right lung ([place])   10/20/2017 PET scan    6 cm central left lower lobe pulmonary mass is markedly hypermetabolic with SUV max = 78.2 and extends into the left hilum.  The sub solid 2.4 cm pulmonary nodule in the right middle lobe also shows FDG accumulation with SUV max = 1.9. No evidence for hypermetabolic mediastinal or right hilar lymphadenopathy. L2 uptake degenerative.  LLL:T3N0M0 stage IIb clinical stage; RML: T2N0 Stage 1B      10/20/2017 Imaging    MRI brain: No metastatic disease, right inferior parietal convexity meningioma 2.9 x 2.7 x 1.4 cm.  This indents the brain and associated with mild brain edema      11/01/2017  Initial Diagnosis    Transbronchial needle aspiration right middle lobe and left lower lobe brushings: Both are positive for malignant cells consistent with non-small cell lung cancer        HISTORY OF PRESENT ILLNESS: [name] French is an 83 y.o. female with above-mentioned breast cancer history, seen at the request of [name] French for newly diagnosed non-small cell lung cancer and incidental finding of meningioma. She presented to her PCP in October 2018 with persistent dry cough that led to an isolated episode of hemoptysis. She denies further episodes of hemoptysis since that time. She presented to her PCP for evaluation and was being worked up for possible pneumonia with a chest x-ray and a CT scan of the chest. CT done on 10/11/2017 showed a 6.0 cm LLL lung mass with additional lung nodules, including a 2.4 cm pulmonary nodule in the RML as well as 0.3 cm satellite nodules. There was also a hypodense 0.4 cm RLL lesion. A PET CT scan on 112/18 demonstrated a hypermetabolic LLL mass extending into the left hilum with SUV of 26.8 (stage IIb, T3N0M0) and a mildly hypermetabolic RML mass with SUV max of 1.9 (stage IB T2N0).  There were no hypermetabolic hilar or mediastinal nodes.  She was evaluated with Dr. Roxan French and underwent bilateral navigational bronchoscopy with biopsies of the lung masses on 11/01/17 which revealed bilateral non-small cell lung cancer, poorly differentiated adenocarcinoma.   An MRI brain was  performed on 10/20/2017 for disease staging and incidentally showed a right inferior parietal convexity meningioma measuring 2.9 x 2.7 x 1.4 cm. There was no evidence of metastatic disease. Her imaging was reviewed today in the multidisciplinary brain conference and consensus recommendation was to repeat a scan in 3 months to assess for progression/enlargement.  The patient reviewed results with [name] French and has kindly been referred today for discussion of potential radiation treatment  options. She is accompanied by her children. [name] French feels that standard concurrent chemoradiation would be too toxic for the patient due to her age. However, she may be considered for Keytruda if her PD1 is greater than 50%. She will follow-up with [name] French for discussion of further treatment after completion of radiotherapy.  PREVIOUS RADIATION THERAPY: No - Patient declined adjuvant radiation for breast cancer in 2014.  PAST MEDICAL HISTORY:  Past Medical History:  Diagnosis Date  . Allergic rhinitis   . Anemia    during pregnancy  . Arthritis   . Breast cancer ([place]) 05/08/13   right upper inner, invasive mammary  . Breast cancer, right breast ([place]) 04/16/2013   Underwent lumpectomy on 11/06/13. Path showed G1 ILC, 2.7 cm, neg margins, receptor+, Her2neg   . Diverticulosis   . 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 Hemorrhoids   . Hx of adenomatous colonic polyps 05/2002  . Lung cancer ([place])   . Meningioma ([place])   . Osteoporosis   . Pneumonia    walking pneumonia  . Rosacea   . 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 Use of letrozole ([place])    neoadjuvant antiestrogen therapy with letrozole 2.5 mg daily x 7 monhts      PAST SURGICAL HISTORY: Past Surgical History:  Procedure Laterality Date  . BREAST BIOPSY Left 1960   lt br bx/benign  . BREAST EXCISIONAL BIOPSY Left   . BREAST LUMPECTOMY Right   . BREAST LUMPECTOMY WITH NEEDLE LOCALIZATION Bilateral 11/06/2013   Procedure: BREAST LUMPECTOMY WITH NEEDLE LOCALIZATION;  Surgeon: [name] Lasso, MD;  Location: [facility];  Service: General;  Laterality: Bilateral;  . COLONOSCOPY    . EYE SURGERY     both cataracts  . MOHS SURGERY Right    nose basal/squamous  . VIDEO BRONCHOSCOPY WITH ENDOBRONCHIAL NAVIGATION N/A 11/01/2017   Procedure: VIDEO BRONCHOSCOPY WITH ENDOBRONCHIAL NAVIGATION;  Surgeon: [name] Nakayama, MD;  Location: [facility];  Service: Thoracic;  Laterality: N/A;  . WRIST SURGERY  1990    lt    FAMILY HISTORY:  Family History  Problem Relation Age of Onset  . Heart disease Mother   . Prostate cancer Father   . Lung cancer Sister     SOCIAL HISTORY:  Social History   Socioeconomic History  . Marital status: Divorced    Spouse name: Not on file  . Number of children: 2  . Years of education: Not on file  . Highest education level: Not on file  Social Needs  . Financial resource strain: Not on file  . Food insecurity - worry: Not on file  . Food insecurity - inability: Not on file  . Transportation needs - medical: Not on file  . Transportation needs - non-medical: Not on file  Occupational History  . Occupation: Retired  Tobacco Use  . Smoking status: Former Smoker    Packs/day: 0.50    Years: 25.00    Pack years: 12.50    Types: Cigarettes    Last attempt  to quit: 12/19/1978    Years since quitting: 38.9  . Smokeless tobacco: Never Used  Substance and Sexual Activity  . Alcohol use: Yes    Alcohol/week: 2.4 oz    Types: 4 Glasses of wine per week  . Drug use: No  . Sexual activity: Not Currently    Comment: menarche at age14, menopause in 40, HRT x 41, age at first live birth 89's, G71 P3  Other Topics Concern  . Not on file  Social History Narrative  . Not on file    ALLERGIES: Codeine; Hydrocodone; and Neosporin [neomycin-bacitracin zn-polymyx]  MEDICATIONS:  Current Outpatient Medications  Medication Sig Dispense Refill  . Artificial Tear Solution (BION TEARS OP) Place 1-2 drops 3 (three) times daily as needed into both eyes (for dry eyes.).    Marland Kitchen Ascorbic Acid (VITAMIN C) 1000 MG tablet Take 1,000 mg daily by mouth.    Marland Kitchen aspirin EC 81 MG tablet Take 81 mg daily by mouth.    . cetirizine (ZYRTEC) 10 MG tablet Take 10 mg daily by mouth.     . Cholecalciferol (VITAMIN D-3) 1000 UNITS CAPS Take 1,000 Units daily by mouth.     Mariane Baumgarten Sodium (COLACE PO) Take 1 tablet by mouth as needed. Colace 2 for 1 new at Pharmacy, OTC    . doxylamine,  Sleep, (UNISOM) 25 MG tablet Take 25 mg at bedtime by mouth.    . GNP GLUCOSAMINE SULFATE PO Take 1 tablet daily by mouth.    . Olopatadine HCl (PAZEO) 0.7 % SOLN Place 1-2 drops 3 (three) times daily as needed into both eyes (for allergy eyes).    . pravastatin (PRAVACHOL) 40 MG tablet Take 40 mg daily by mouth.     . Probiotic Product (ALIGN) 4 MG CAPS Take 4 mg daily by mouth.    . SUPER B COMPLEX & C TABS Take 1 tablet by mouth daily.  0  . diclofenac sodium (VOLTAREN) 1 % GEL Apply 2-4 g 4 (four) times daily as needed topically (for shoulder/leg pain.).    Marland Kitchen loperamide (IMODIUM) 2 MG capsule Take 2-4 mg 3 (three) times daily as needed by mouth for diarrhea or loose stools.    . Menthol, Topical Analgesic, (BIOFREEZE EX) Apply 1-2 application 3 (three) times daily as needed topically (for pain/muscle aches.).    Marland Kitchen naphazoline-pheniramine (NAPHCON-A) 0.025-0.3 % ophthalmic solution Place 1-2 drops 4 (four) times daily as needed into both eyes for eye irritation or allergies.    Marland Kitchen zolpidem (AMBIEN) 5 MG tablet Take 5 mg at bedtime as needed by mouth (for sleep.).   2   No current facility-administered medications for this encounter.     REVIEW OF SYSTEMS:  On review of systems, the patient reports that she is doing well overall. She denies any chest pain, shortness of breath, fevers, chills, or night sweats. She reports a dry cough that she attributes to allergies. She currently denies hemoptysis. She reports an unintended 5-pound weight loss over the last few weeks due to poor appetite. She denies any bowel or bladder disturbances, and denies abdominal pain, nausea or vomiting. She reports low back and left leg pain related to arthritis, managed with cortisone injections. A complete review of systems is obtained and is otherwise negative.    PHYSICAL EXAM:  Wt Readings from Last 3 Encounters:  11/15/17 135 lb 3.2 oz (61.3 kg)  11/07/17 134 lb 11.2 oz (61.1 kg)  11/01/17 137 lb 6.4 oz (62.3  kg)  Temp Readings from Last 3 Encounters:  11/15/17 98.2 F (36.8 C) (Oral)  11/07/17 98 F (36.7 C) (Oral)  11/01/17 98 F (36.7 C)   BP Readings from Last 3 Encounters:  11/15/17 (!) 145/58  11/07/17 (!) 157/72  11/01/17 140/69   Pulse Readings from Last 3 Encounters:  11/15/17 88  11/07/17 (!) 130  11/01/17 99   Pain Assessment Pain Score: 0-No pain/10  In general this is a well appearing Caucasian woman in no acute distress. She is alert and oriented x4 and appropriate throughout the examination. HEENT reveals that the patient is normocephalic, atraumatic. Skin is intact without any evidence of gross lesions. Cardiovascular exam reveals a regular rate and rhythm, no clicks rubs or murmurs are auscultated. Chest is clear to auscultation bilaterally. Lymphatic assessment is performed and does not reveal any adenopathy in the cervical, supraclavicular, axillary, or inguinal chains. Abdomen has active bowel sounds in all quadrants and is intact. The abdomen is soft, non tender, non distended. Lower extremities are negative for pretibial pitting edema, deep calf tenderness, cyanosis or clubbing.  KPS = 90  100 - Normal; no complaints; no evidence of disease. 90   - Able to carry on normal activity; minor signs or symptoms of disease. 80   - Normal activity with effort; some signs or symptoms of disease. 54   - Cares for self; unable to carry on normal activity or to do active work. 60   - Requires occasional assistance, but is able to care for most of his personal needs. 50   - Requires considerable assistance and frequent medical care. 66   - Disabled; requires special care and assistance. 76   - Severely disabled; hospital admission is indicated although death not imminent. 37   - Very sick; hospital admission necessary; active supportive treatment necessary. 10   - Moribund; fatal processes progressing rapidly. 0     - Dead  Karnofsky DA, Abelmann [place], Craver LS and  Burchenal JH [phone]) The use of the nitrogen mustards in the palliative treatment of carcinoma: with particular reference to bronchogenic carcinoma Cancer 1 634-56  LABORATORY DATA:  Lab Results  Component Value Date   WBC 8.0 11/01/2017   HGB 13.8 11/01/2017   HCT 41.9 11/01/2017   MCV 86.0 11/01/2017   PLT 264 11/01/2017   Lab Results  Component Value Date   NA 138 11/01/2017   K 3.8 11/01/2017   CL 103 11/01/2017   CO2 27 11/01/2017   Lab Results  Component Value Date   ALT 12 (L) 11/01/2017   AST 19 11/01/2017   ALKPHOS 63 11/01/2017   BILITOT 0.8 11/01/2017     RADIOGRAPHY: Dg Chest 2 View  Result Date: 11/01/2017 CLINICAL DATA:  Preop for video bronchoscopy.  Cough. EXAM: CHEST  2 VIEW COMPARISON:  PET scan of October 20, 2017. FINDINGS: The heart size and mediastinal contours are within normal limits. Atherosclerosis of thoracic aorta is noted. Right lung is clear. Rounded left infrahilar mass is noted concerning for malignancy. Probable left pleural effusion is noted with associated atelectasis. The visualized skeletal structures are unremarkable. IMPRESSION: Rounded left infrahilar mass is noted concerning for malignancy. Probable left pleural effusion is noted with associated atelectasis. Electronically Signed   By: Marijo Conception, M.D.   On: 11/01/2017 12:07   Mr Jeri Cos WC Contrast  Result Date: 10/20/2017 CLINICAL DATA:  New diagnosis lung cancer.  Staging. EXAM: MRI HEAD WITHOUT AND WITH CONTRAST TECHNIQUE: Multiplanar, multiecho pulse sequences  of the brain and surrounding structures were obtained without and with intravenous contrast. CONTRAST:  69m MULTIHANCE GADOBENATE DIMEGLUMINE 529 MG/ML IV SOLN COMPARISON:  None. FINDINGS: Brain: Brain shows mild age related volume loss. No evidence of old or acute small or large vessel infarction. No intra-axial mass lesion, hemorrhage, hydrocephalus or extra-axial fluid collection. There is a right lower parietal convexity  meningioma measuring 2.9 cm cephalo caudal, 2.7 cm front to back and 14 mm in thickness. This indents the brain and is associated with mild adjacent vasogenic edema. No evidence of calvarial involvement. No evidence of intracranial metastatic disease. Vascular: Major vessels at the base of the brain show flow. Skull and upper cervical spine: Normal Sinuses/Orbits: Clear/normal Other: None IMPRESSION: No evidence of metastatic disease. Right inferior parietal convexity meningioma measuring 2.9 x 2.7 x 1.4 cm. This indents the brain and is associated with mild brain edema. No midline shift. No ischemic changes either old or acute. Electronically Signed   By: MNelson ChimesM.D.   On: 10/20/2017 13:36   Nm Pet Image Initial (pi) Skull Base To Thigh  Result Date: 10/20/2017 CLINICAL DATA:  Initial treatment strategy for lung cancer. EXAM: NUCLEAR MEDICINE PET SKULL BASE TO THIGH TECHNIQUE: 6.9 mCi F-18 FDG was injected intravenously. Full-ring PET imaging was performed from the skull base to thigh after the radiotracer. CT data was obtained and used for attenuation correction and anatomic localization. FASTING BLOOD GLUCOSE:  Value: 99 mg/dl COMPARISON:  CT chest 10/11/2017. FINDINGS: NECK: No hypermetabolic lymph nodes in the neck. CHEST: 6 cm central left lower lobe pulmonary mass is markedly hypermetabolic with SUV max = 219.4and extends into the left hilum. The sub solid 2.4 cm pulmonary nodule in the right middle lobe also shows FDG accumulation with SUV max = 1.9. No evidence for hypermetabolic mediastinal or right hilar lymphadenopathy. ABDOMEN/PELVIS: No abnormal hypermetabolic activity within the liver, pancreas, adrenal glands, or spleen. No hypermetabolic lymph nodes in the abdomen or pelvis. Focal hypermetabolism identified in the right colon, at upper portion compare to background FDG uptake in the bowel. Tiny layering gallstones evident. There is abdominal aortic atherosclerosis without aneurysm.  SKELETON: Focal hypermetabolic FDG accumulation is identified in the anterior aspect of the L2 vertebral body without underlying lesion evident on CT imaging. This is in a region of degenerative change. IMPRESSION: 1. Central left lower lobe pulmonary mass is markedly hypermetabolic consistent with neoplasm. 2. Sub solid 2.4 cm pulmonary lesion in the right middle lobe also shows FDG uptake, concerning for neoplasm such as adenocarcinoma. 3. Focal hypermetabolism in the right colon without underlying gross mass lesion evident on CT imaging. This can be seen in the setting of adenoma or neoplasm. 4. Tiny focus of FDG accumulation in the anterior aspect of the L2 vertebral body, potentially degenerative. Close attention on follow-up recommended. 5. Cholelithiasis. 6.  Aortic Atherosclerois (ICD10-170.0) Electronically Signed   By: EMisty StanleyM.D.   On: 10/20/2017 14:15   Dg Chest Port 1 View  Result Date: 11/01/2017 CLINICAL DATA:  Post bronchoscopy. No shortness of breath. History of cancer and pneumonia. EXAM: PORTABLE CHEST 1 VIEW COMPARISON:  Radiographs 11/01/2017, PET-CT 10/20/2017 and chest CT 10/11/2017. FINDINGS: 1725 hours. The heart size and mediastinal contours are stable. There is atherosclerosis of the aorta. Left lower lobe mass, associated volume loss and left pleural effusion are grossly stable. Patchy density in the right middle lobe likely corresponds with the ground-glass density on CT. There is no pneumothorax. IMPRESSION: No demonstrated complication  following bronchoscopy. Known left lower lobe mass and right middle lobe focal ground-glass opacity. Electronically Signed   By: Richardean Sale M.D.   On: 11/01/2017 17:47   Mm Diag Breast Tomo Bilateral  Result Date: 11/06/2017 CLINICAL DATA:  History of treated right breast cancer, status post lumpectomy in 2014. History of excision biopsy of the left breast in 2014 as well. EXAM: 2D DIGITAL DIAGNOSTIC BILATERAL MAMMOGRAM WITH CAD AND  ADJUNCT TOMO COMPARISON:  Previous exam(s). ACR Breast Density Category c: The breast tissue is heterogeneously dense, which may obscure small masses. FINDINGS: Mammographically, there are no suspicious masses, areas of nonsurgical architectural distortion or microcalcifications in either breast. There are stable postsurgical changes in both breasts. Mammographic images were processed with CAD. IMPRESSION: No mammographic evidence of malignancy in either breast, status post right breast lumpectomy and left breast excisional biopsy. RECOMMENDATION: Diagnostic mammogram is suggested in 1 year. (Code:[id number]-01Y) I have discussed the findings and recommendations with the patient. Results were also provided in writing at the conclusion of the visit. If applicable, a reminder letter will be sent to the patient regarding the next appointment. BI-RADS CATEGORY  2: Benign. Electronically Signed   By: Fidela Salisbury M.D.   On: 11/06/2017 10:44   Dg C-arm Bronchoscopy  Result Date: 11/01/2017 C-ARM BRONCHOSCOPY: Fluoroscopy was utilized by the requesting physician.  No radiographic interpretation.      IMPRESSION/PLAN: 73. 83 y.o. woman with newly diagnosed synchronous NSCLC, adenocarcinoma with a 6 cm LLL mass extending into the left hilum (stage IIb, T3N0M0) and a 2.4 cm RML mass (stage IB T2N0).  Today, we talked to the patient and family about the findings and work-up thus far.  We discussed the natural history of NSCLC, adenocarcinoma and general treatment, highlighting the role of radiotherapy in the management.  We discussed the available radiation techniques, and focused on the details of logistics and delivery.  We reviewed the anticipated acute and late sequelae associated with radiation in this setting.  The patient was encouraged to ask questions that we answered to the best of our ability.  Dr. Tammi Klippel recommends a 6 1/2 week course of daily radiotherapy to the LLL and RML lesions. The patient elects  to proceed with radiotherapy and will be scheduled for CT simulation on Friday 11/17/2017 at 2:00 PM with treatment anticipated to begin around 11/27/2017.  2. Meningioma. This was an incidental finding and patient is currently without neurologic or systemic complaints.  It does not appear to be causing a mass effect on any structures in the brain. The consensus at brain conference this morning is watchful waiting with repeat brain imaging in 3 months.  She is in agreement with and is comfortable with this plan.  We spent 60 minutes face to face with the patient and more than 50% of that time was spent in counseling and/or coordination of care.   Nicholos Johns, PA-C    Tyler Pita, MD  [facility] Direct Dial: [phone]  Fax: [phone] .com  Skype  LinkedIn    Page Me   This document serves as a record of services personally performed by Tyler Pita, MD and Freeman Caldron, PA-C. It was created on their behalf by Rae Lips, a trained medical scribe. The creation of this record is based on the scribe's personal observations and the providers' statements to them. This document has been checked and approved by the attending providers.

## 2017-11-15 NOTE — Progress Notes (Signed)
Please see the Nurse Progress Note in the MD Initial Consult Encounter for this patient. 

## 2017-11-15 NOTE — Progress Notes (Signed)
See progress note under physician encounter. 

## 2017-11-16 ENCOUNTER — Ambulatory Visit (INDEPENDENT_AMBULATORY_CARE_PROVIDER_SITE_OTHER): Payer: Medicare Other | Admitting: Gastroenterology

## 2017-11-16 ENCOUNTER — Encounter: Payer: Self-pay | Admitting: Gastroenterology

## 2017-11-16 VITALS — BP 118/68 | HR 66 | Ht 65.0 in | Wt 134.2 lb

## 2017-11-16 DIAGNOSIS — R933 Abnormal findings on diagnostic imaging of other parts of digestive tract: Secondary | ICD-10-CM | POA: Diagnosis not present

## 2017-11-16 MED ORDER — NA SULFATE-K SULFATE-MG SULF 17.5-3.13-1.6 GM/177ML PO SOLN: ORAL | 0 refills | Status: DC

## 2017-11-16 NOTE — Progress Notes (Signed)
Reviewed and agree with management plan.  Malcolm T. Stark, MD FACG 

## 2017-11-16 NOTE — Progress Notes (Signed)
11/16/2017 [name] French [id number] 1926/04/27   HISTORY OF PRESENT ILLNESS:  This is an 83 year old female who is known to Dr. Fuller Plan.  Has little in the way of chronic medical problems, but was recently diagnosed with B/L non-small cell lung cancer.  Is going to undergo radiation, ? Chemo.  PET scan showed a focal area of hypermetabolism in the right colon without underlying mass lesion evident on CT imaging, ? Adenoma vs neoplasm.  She is here today at the request of Dr. Lindi Adie to discuss and possibly schedule colonoscopy.    Of note, patient was somewhat disgruntled because she was not seeing Dr. Fuller Plan today.  She denies any issues with moving her bowels, had only a very mild episode of constipation recently.  Denies seeing blood in her stools.  No abdominal pain.  Her last colonoscopy was in 11/2011 at which time she was found to have only diverticulosis and internal hemorrhoids, but has history of adenomatous colon polyps in the past.  Past Medical History:  Diagnosis Date  . Allergic rhinitis   . Anemia    during pregnancy  . Arthritis   . Breast cancer ([place]) 05/08/13   right upper inner, invasive mammary  . Breast cancer, right breast ([place]) 04/16/2013   Underwent lumpectomy on 11/06/13. Path showed G1 ILC, 2.7 cm, neg margins, receptor+, Her2neg   . Diverticulosis   . 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 Hemorrhoids   . Hx of adenomatous colonic polyps 05/2002  . Lung cancer ([place])   . Meningioma ([place])   . Osteoporosis   . Pneumonia    walking pneumonia  . Rosacea   . 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 Use of letrozole (Femara)    neoadjuvant antiestrogen therapy with letrozole 2.5 mg daily x 7 monhts   Past Surgical History:  Procedure Laterality Date  . BREAST BIOPSY Left 1960   lt br bx/benign  . BREAST EXCISIONAL BIOPSY Left   . BREAST LUMPECTOMY Right   . BREAST LUMPECTOMY WITH NEEDLE LOCALIZATION Bilateral 11/06/2013   Procedure: BREAST LUMPECTOMY  WITH NEEDLE LOCALIZATION;  Surgeon: Haywood Lasso, MD;  Location: [facility];  Service: General;  Laterality: Bilateral;  . COLONOSCOPY    . EYE SURGERY     both cataracts  . MOHS SURGERY Right    nose basal/squamous  . VIDEO BRONCHOSCOPY WITH ENDOBRONCHIAL NAVIGATION N/A 11/01/2017   Procedure: VIDEO BRONCHOSCOPY WITH ENDOBRONCHIAL NAVIGATION;  Surgeon: [name], MD;  Location: [facility];  Service: Thoracic;  Laterality: N/A;  . WRIST SURGERY  1990   lt    reports that she quit smoking about 38 years ago. Her smoking use included cigarettes. She has a 12.50 pack-year smoking history. she has never used smokeless tobacco. She reports that she drinks about 2.4 oz of alcohol per week. She reports that she does not use drugs. family history includes Heart disease in her mother; Lung cancer in her sister; Prostate cancer in her father. Allergies  Allergen Reactions  . Codeine Itching  . Hydrocodone Itching and Other (See Comments)    INTOLERANCE >  Insomnia  . Neosporin [Neomycin-Bacitracin Zn-Polymyx] Rash      Outpatient Encounter Medications as of 11/16/2017  Medication Sig  . Artificial Tear Solution (BION TEARS OP) Place 1-2 drops 3 (three) times daily as needed into both eyes (for dry eyes.).  Marland Kitchen Ascorbic Acid (VITAMIN C) 1000 MG tablet Take 1,000 mg daily  by mouth.  Marland Kitchen aspirin EC 81 MG tablet Take 81 mg daily by mouth.  . cetirizine (ZYRTEC) 10 MG tablet Take 10 mg daily by mouth.   . Cholecalciferol (VITAMIN D-3) 1000 UNITS CAPS Take 1,000 Units daily by mouth.   . diclofenac sodium (VOLTAREN) 1 % GEL Apply 2-4 g 4 (four) times daily as needed topically (for shoulder/leg pain.).  Marland Kitchen Docusate Sodium (COLACE PO) Take 1 tablet by mouth as needed. Colace 2 for 1 new at Pharmacy, OTC  . doxylamine, Sleep, (UNISOM) 25 MG tablet Take 25 mg at bedtime by mouth.  . GNP GLUCOSAMINE SULFATE PO Take 1 tablet daily by mouth.  . loperamide (IMODIUM) 2 MG capsule Take  2-4 mg 3 (three) times daily as needed by mouth for diarrhea or loose stools.  . Menthol, Topical Analgesic, (BIOFREEZE EX) Apply 1-2 application 3 (three) times daily as needed topically (for pain/muscle aches.).  Marland Kitchen naphazoline-pheniramine (NAPHCON-A) 0.025-0.3 % ophthalmic solution Place 1-2 drops 4 (four) times daily as needed into both eyes for eye irritation or allergies.  . Olopatadine HCl (PAZEO) 0.7 % SOLN Place 1-2 drops 3 (three) times daily as needed into both eyes (for allergy eyes).  . pravastatin (PRAVACHOL) 40 MG tablet Take 40 mg daily by mouth.   . Probiotic Product (ALIGN) 4 MG CAPS Take 4 mg daily by mouth.  . SUPER B COMPLEX & C TABS Take 1 tablet by mouth daily.  Marland Kitchen zolpidem (AMBIEN) 5 MG tablet Take 5 mg at bedtime as needed by mouth (for sleep.).    No facility-administered encounter medications on file as of 11/16/2017.      REVIEW OF SYSTEMS  : All other systems reviewed and negative except where noted in the History of Present Illness.   PHYSICAL EXAM: BP 118/68   Pulse 66   Ht 5\' 5"  (1.651 m)   Wt 134 lb 4 oz (60.9 kg)   BMI 22.34 kg/m  General: Well developed white female in no acute distress Head: Normocephalic and atraumatic Eyes:  Sclerae anicteric, conjunctiva pink. Ears: Normal auditory acuity Lungs: Clear throughout to auscultation; no increased WOB. Heart: Regular rate and rhythm; no M/R/G. Abdomen: Soft, non-distended.  BS present.  Normal bowel sounds.  Non-tender. Rectal:  Will be done at the time of colonoscopy. Musculoskeletal: Symmetrical with no gross deformities  Skin: No lesions on visible extremities Extremities: No edema  Neurological: Alert oriented x 4, grossly non-focal Psychological:  Alert and cooperative. Normal mood and affect  ASSESSMENT AND PLAN: *83 year old female who was recently diagnosed with lung cancer, non-small cell.  PET scan showed an area of hypermetabolism in the right colon although no mass lesion identified.   Has been sent here to discuss and schedule colonoscopy to evaluate that area.  Will schedule with Dr. Fuller Plan.    CC:  Tisovec, Fransico Him, MD

## 2017-11-16 NOTE — Patient Instructions (Signed)
If you are age 81 or older, your body mass index should be between 23-30. Your Body mass index is 22.34 kg/m. If this is out of the aforementioned range listed, please consider follow up with your Primary Care Provider.  If you are age 68 or younger, your body mass index should be between 19-25. Your Body mass index is 22.34 kg/m. If this is out of the aformentioned range listed, please consider follow up with your Primary Care Provider.   You have been scheduled for a colonoscopy. Please follow written instructions given to you at your visit today.  Please pick up your prep supplies at the pharmacy within the next 1-3 days. If you use inhalers (even only as needed), please bring them with you on the day of your procedure. Your physician has requested that you go to www.startemmi.com and enter the access code given to you at your visit today. This web site gives a general overview about your procedure. However, you should still follow specific instructions given to you by our office regarding your preparation for the procedure.  We have sent the following medications to your pharmacy for you to pick up at your convenience: Suprep  Thank you for choosing me and [facility].   Alonza Bogus, PA-C

## 2017-11-17 ENCOUNTER — Ambulatory Visit
Admission: RE | Admit: 2017-11-17 | Discharge: 2017-11-17 | Disposition: A | Discharge status: Home / Self Care | Payer: Medicare Other | Source: Ambulatory Visit | Attending: Radiation Oncology | Admitting: Radiation Oncology

## 2017-11-17 DIAGNOSIS — C3432 Malignant neoplasm of lower lobe, left bronchus or lung: Secondary | ICD-10-CM | POA: Diagnosis not present

## 2017-11-17 DIAGNOSIS — D320 Benign neoplasm of cerebral meninges: Secondary | ICD-10-CM | POA: Diagnosis not present

## 2017-11-17 DIAGNOSIS — Z9889 Other specified postprocedural states: Secondary | ICD-10-CM | POA: Diagnosis not present

## 2017-11-17 DIAGNOSIS — C342 Malignant neoplasm of middle lobe, bronchus or lung: Secondary | ICD-10-CM | POA: Diagnosis not present

## 2017-11-17 DIAGNOSIS — Z510 Encounter for antineoplastic radiation therapy: Secondary | ICD-10-CM | POA: Diagnosis not present

## 2017-11-17 DIAGNOSIS — M1990 Unspecified osteoarthritis, unspecified site: Secondary | ICD-10-CM | POA: Diagnosis not present

## 2017-11-17 DIAGNOSIS — Z853 Personal history of malignant neoplasm of breast: Secondary | ICD-10-CM | POA: Diagnosis not present

## 2017-11-17 NOTE — Progress Notes (Signed)
  Radiation Oncology         [phone] ________________________________  Name: [name] French MRN: [id number]  Date: 11/17/2017  DOB: 05/21/1926  SIMULATION AND TREATMENT PLANNING NOTE    ICD-10-CM   1. Primary cancer of left lower lobe of lung (HCC) C34.32     DIAGNOSIS:  83 y.o. woman with newly diagnosed synchronous NSCLC, adenocarcinoma with a 6 cm LLL mass extending into the left hilum (stage IIB, T3N0M0) and a 2.4 cm RML mass (stage IB T2N0).   NARRATIVE:  The patient was brought to the [facility].  Identity was confirmed.  All relevant records and images related to the planned course of therapy were reviewed.  The patient freely provided informed written consent to proceed with treatment after reviewing the details related to the planned course of therapy. The consent form was witnessed and verified by the simulation staff.  Then, the patient was set-up in a stable reproducible  supine position for radiation therapy.  CT images were obtained.  Surface markings were placed.  The CT images were loaded into the planning software.  Then the target and avoidance structures were contoured.  Treatment planning then occurred.  The radiation prescription was entered and confirmed.  Then, I designed and supervised the construction of a total of 6 medically necessary complex treatment devices, including a BodyFix immobilization mold custom fitted to the patient along with 5 multileaf collimators conformally shaped radiation around the treatment target while shielding critical structures such as the heart and spinal cord maximally.  I have requested : 3D Simulation  I have requested a DVH of the following structures: Left lung, right lung, spinal cord, heart, esophagus, and target.  I have ordered:Nutrition Consult  PLAN:  The patient will receive 66 Gy in 33 fractions.  ________________________________  Sheral Apley Tammi Klippel, M.D.  This document serves as a record of services  personally performed by Tyler Pita, MD. It was created on his behalf by Rae Lips, a trained medical scribe. The creation of this record is based on the scribe's personal observations and the provider's statements to them. This document has been checked and approved by the attending provider.

## 2017-11-22 ENCOUNTER — Encounter: Payer: Medicare Other | Admitting: Gastroenterology

## 2017-11-22 DIAGNOSIS — Z510 Encounter for antineoplastic radiation therapy: Secondary | ICD-10-CM | POA: Diagnosis not present

## 2017-11-22 DIAGNOSIS — Z853 Personal history of malignant neoplasm of breast: Secondary | ICD-10-CM | POA: Diagnosis not present

## 2017-11-22 DIAGNOSIS — C3432 Malignant neoplasm of lower lobe, left bronchus or lung: Secondary | ICD-10-CM | POA: Diagnosis not present

## 2017-11-22 DIAGNOSIS — C342 Malignant neoplasm of middle lobe, bronchus or lung: Secondary | ICD-10-CM | POA: Diagnosis not present

## 2017-11-22 DIAGNOSIS — D320 Benign neoplasm of cerebral meninges: Secondary | ICD-10-CM | POA: Diagnosis not present

## 2017-11-22 DIAGNOSIS — Z9889 Other specified postprocedural states: Secondary | ICD-10-CM | POA: Diagnosis not present

## 2017-11-22 DIAGNOSIS — M1990 Unspecified osteoarthritis, unspecified site: Secondary | ICD-10-CM | POA: Diagnosis not present

## 2017-11-24 ENCOUNTER — Telehealth: Payer: Self-pay | Admitting: Gastroenterology

## 2017-11-24 NOTE — Telephone Encounter (Signed)
The pt has been scheduled for 01/18/18.  She will call back and change if her radiation schedule changes

## 2017-11-27 ENCOUNTER — Encounter: Payer: Medicare Other | Admitting: Gastroenterology

## 2017-11-28 ENCOUNTER — Ambulatory Visit: Payer: Medicare Other | Admitting: Radiation Oncology

## 2017-11-28 ENCOUNTER — Telehealth: Payer: Self-pay | Admitting: Hematology and Oncology

## 2017-11-28 DIAGNOSIS — Z9889 Other specified postprocedural states: Secondary | ICD-10-CM | POA: Diagnosis not present

## 2017-11-28 DIAGNOSIS — C342 Malignant neoplasm of middle lobe, bronchus or lung: Secondary | ICD-10-CM | POA: Diagnosis not present

## 2017-11-28 DIAGNOSIS — D320 Benign neoplasm of cerebral meninges: Secondary | ICD-10-CM | POA: Diagnosis not present

## 2017-11-28 DIAGNOSIS — C3432 Malignant neoplasm of lower lobe, left bronchus or lung: Secondary | ICD-10-CM | POA: Diagnosis not present

## 2017-11-28 DIAGNOSIS — M1990 Unspecified osteoarthritis, unspecified site: Secondary | ICD-10-CM | POA: Diagnosis not present

## 2017-11-28 DIAGNOSIS — Z510 Encounter for antineoplastic radiation therapy: Secondary | ICD-10-CM | POA: Diagnosis not present

## 2017-11-28 DIAGNOSIS — Z853 Personal history of malignant neoplasm of breast: Secondary | ICD-10-CM | POA: Diagnosis not present

## 2017-11-28 NOTE — Telephone Encounter (Signed)
Spoke with patient regarding appt per sched message.

## 2017-11-29 ENCOUNTER — Ambulatory Visit
Admission: RE | Admit: 2017-11-29 | Discharge: 2017-11-29 | Disposition: A | Discharge status: Home / Self Care | Payer: Medicare Other | Source: Ambulatory Visit | Attending: Radiation Oncology | Admitting: Radiation Oncology

## 2017-11-29 DIAGNOSIS — D320 Benign neoplasm of cerebral meninges: Secondary | ICD-10-CM | POA: Diagnosis not present

## 2017-11-29 DIAGNOSIS — C3432 Malignant neoplasm of lower lobe, left bronchus or lung: Secondary | ICD-10-CM | POA: Diagnosis not present

## 2017-11-29 DIAGNOSIS — C342 Malignant neoplasm of middle lobe, bronchus or lung: Secondary | ICD-10-CM | POA: Diagnosis not present

## 2017-11-29 DIAGNOSIS — Z853 Personal history of malignant neoplasm of breast: Secondary | ICD-10-CM | POA: Diagnosis not present

## 2017-11-29 DIAGNOSIS — Z9889 Other specified postprocedural states: Secondary | ICD-10-CM | POA: Diagnosis not present

## 2017-11-29 DIAGNOSIS — M1990 Unspecified osteoarthritis, unspecified site: Secondary | ICD-10-CM | POA: Diagnosis not present

## 2017-11-29 DIAGNOSIS — Z510 Encounter for antineoplastic radiation therapy: Secondary | ICD-10-CM | POA: Diagnosis not present

## 2017-11-30 ENCOUNTER — Ambulatory Visit
Admission: RE | Admit: 2017-11-30 | Discharge: 2017-11-30 | Disposition: A | Discharge status: Home / Self Care | Payer: Medicare Other | Source: Ambulatory Visit | Attending: Radiation Oncology | Admitting: Radiation Oncology

## 2017-11-30 DIAGNOSIS — Z9889 Other specified postprocedural states: Secondary | ICD-10-CM | POA: Diagnosis not present

## 2017-11-30 DIAGNOSIS — D320 Benign neoplasm of cerebral meninges: Secondary | ICD-10-CM | POA: Diagnosis not present

## 2017-11-30 DIAGNOSIS — Z853 Personal history of malignant neoplasm of breast: Secondary | ICD-10-CM | POA: Diagnosis not present

## 2017-11-30 DIAGNOSIS — C3432 Malignant neoplasm of lower lobe, left bronchus or lung: Secondary | ICD-10-CM | POA: Diagnosis not present

## 2017-11-30 DIAGNOSIS — C342 Malignant neoplasm of middle lobe, bronchus or lung: Secondary | ICD-10-CM | POA: Diagnosis not present

## 2017-11-30 DIAGNOSIS — M1990 Unspecified osteoarthritis, unspecified site: Secondary | ICD-10-CM | POA: Diagnosis not present

## 2017-11-30 DIAGNOSIS — Z510 Encounter for antineoplastic radiation therapy: Secondary | ICD-10-CM | POA: Diagnosis not present

## 2017-12-01 ENCOUNTER — Ambulatory Visit
Admission: RE | Admit: 2017-12-01 | Discharge: 2017-12-01 | Disposition: A | Discharge status: Home / Self Care | Payer: Medicare Other | Source: Ambulatory Visit | Attending: Radiation Oncology | Admitting: Radiation Oncology

## 2017-12-01 DIAGNOSIS — Z510 Encounter for antineoplastic radiation therapy: Secondary | ICD-10-CM | POA: Diagnosis not present

## 2017-12-01 DIAGNOSIS — C3432 Malignant neoplasm of lower lobe, left bronchus or lung: Secondary | ICD-10-CM | POA: Diagnosis not present

## 2017-12-01 DIAGNOSIS — C342 Malignant neoplasm of middle lobe, bronchus or lung: Secondary | ICD-10-CM | POA: Diagnosis not present

## 2017-12-01 DIAGNOSIS — D320 Benign neoplasm of cerebral meninges: Secondary | ICD-10-CM | POA: Diagnosis not present

## 2017-12-01 DIAGNOSIS — C50211 Malignant neoplasm of upper-inner quadrant of right female breast: Secondary | ICD-10-CM | POA: Diagnosis not present

## 2017-12-01 DIAGNOSIS — Z9889 Other specified postprocedural states: Secondary | ICD-10-CM | POA: Diagnosis not present

## 2017-12-01 DIAGNOSIS — Z853 Personal history of malignant neoplasm of breast: Secondary | ICD-10-CM | POA: Diagnosis not present

## 2017-12-01 DIAGNOSIS — M1990 Unspecified osteoarthritis, unspecified site: Secondary | ICD-10-CM | POA: Diagnosis not present

## 2017-12-01 MED ORDER — SONAFINE EX EMUL
1.0000 "application " | Freq: Two times a day (BID) | CUTANEOUS | Status: DC
  Administered 2017-12-01: 1 via TOPICAL

## 2017-12-01 NOTE — Progress Notes (Signed)
Pt here for patient teaching.  Pt given Radiation and You booklet, skin care instructions and Sonafine.  Reviewed areas of pertinence such as fatigue, skin changes, throat changes, cough and shortness of breath . Pt able to give teach back of to pat skin and use unscented/gentle soap,apply Sonafine bid and avoid applying anything to skin within 4 hours of treatment. Pt demonstrated understanding, needs reinforcement, no evidence of learning, refused teaching and of information given and will contact nursing with any questions or concerns.     Http://rtanswers.org/treatmentinformation/whattoexpect/index

## 2017-12-04 ENCOUNTER — Ambulatory Visit
Admission: RE | Admit: 2017-12-04 | Discharge: 2017-12-04 | Disposition: A | Discharge status: Home / Self Care | Payer: Medicare Other | Source: Ambulatory Visit | Attending: Radiation Oncology | Admitting: Radiation Oncology

## 2017-12-04 DIAGNOSIS — Z853 Personal history of malignant neoplasm of breast: Secondary | ICD-10-CM | POA: Diagnosis not present

## 2017-12-04 DIAGNOSIS — Z9889 Other specified postprocedural states: Secondary | ICD-10-CM | POA: Diagnosis not present

## 2017-12-04 DIAGNOSIS — M1990 Unspecified osteoarthritis, unspecified site: Secondary | ICD-10-CM | POA: Diagnosis not present

## 2017-12-04 DIAGNOSIS — Z510 Encounter for antineoplastic radiation therapy: Secondary | ICD-10-CM | POA: Diagnosis not present

## 2017-12-04 DIAGNOSIS — D320 Benign neoplasm of cerebral meninges: Secondary | ICD-10-CM | POA: Diagnosis not present

## 2017-12-04 DIAGNOSIS — C3432 Malignant neoplasm of lower lobe, left bronchus or lung: Secondary | ICD-10-CM | POA: Diagnosis not present

## 2017-12-04 DIAGNOSIS — C342 Malignant neoplasm of middle lobe, bronchus or lung: Secondary | ICD-10-CM | POA: Diagnosis not present

## 2017-12-05 ENCOUNTER — Ambulatory Visit
Admission: RE | Admit: 2017-12-05 | Discharge: 2017-12-05 | Disposition: A | Discharge status: Home / Self Care | Payer: Medicare Other | Source: Ambulatory Visit | Attending: Radiation Oncology | Admitting: Radiation Oncology

## 2017-12-05 DIAGNOSIS — M1990 Unspecified osteoarthritis, unspecified site: Secondary | ICD-10-CM | POA: Diagnosis not present

## 2017-12-05 DIAGNOSIS — C3432 Malignant neoplasm of lower lobe, left bronchus or lung: Secondary | ICD-10-CM | POA: Diagnosis not present

## 2017-12-05 DIAGNOSIS — Z9889 Other specified postprocedural states: Secondary | ICD-10-CM | POA: Diagnosis not present

## 2017-12-05 DIAGNOSIS — Z853 Personal history of malignant neoplasm of breast: Secondary | ICD-10-CM | POA: Diagnosis not present

## 2017-12-05 DIAGNOSIS — Z510 Encounter for antineoplastic radiation therapy: Secondary | ICD-10-CM | POA: Diagnosis not present

## 2017-12-05 DIAGNOSIS — D320 Benign neoplasm of cerebral meninges: Secondary | ICD-10-CM | POA: Diagnosis not present

## 2017-12-05 DIAGNOSIS — C342 Malignant neoplasm of middle lobe, bronchus or lung: Secondary | ICD-10-CM | POA: Diagnosis not present

## 2017-12-06 ENCOUNTER — Ambulatory Visit
Admission: RE | Admit: 2017-12-06 | Discharge: 2017-12-06 | Disposition: A | Discharge status: Home / Self Care | Payer: Medicare Other | Source: Ambulatory Visit | Attending: Radiation Oncology | Admitting: Radiation Oncology

## 2017-12-06 DIAGNOSIS — Z9889 Other specified postprocedural states: Secondary | ICD-10-CM | POA: Diagnosis not present

## 2017-12-06 DIAGNOSIS — Z510 Encounter for antineoplastic radiation therapy: Secondary | ICD-10-CM | POA: Diagnosis not present

## 2017-12-06 DIAGNOSIS — C342 Malignant neoplasm of middle lobe, bronchus or lung: Secondary | ICD-10-CM | POA: Diagnosis not present

## 2017-12-06 DIAGNOSIS — C3432 Malignant neoplasm of lower lobe, left bronchus or lung: Secondary | ICD-10-CM | POA: Diagnosis not present

## 2017-12-06 DIAGNOSIS — M1990 Unspecified osteoarthritis, unspecified site: Secondary | ICD-10-CM | POA: Diagnosis not present

## 2017-12-06 DIAGNOSIS — Z853 Personal history of malignant neoplasm of breast: Secondary | ICD-10-CM | POA: Diagnosis not present

## 2017-12-06 DIAGNOSIS — D320 Benign neoplasm of cerebral meninges: Secondary | ICD-10-CM | POA: Diagnosis not present

## 2017-12-07 ENCOUNTER — Ambulatory Visit
Admission: RE | Admit: 2017-12-07 | Discharge: 2017-12-07 | Disposition: A | Discharge status: Home / Self Care | Payer: Medicare Other | Source: Ambulatory Visit | Attending: Radiation Oncology | Admitting: Radiation Oncology

## 2017-12-07 ENCOUNTER — Encounter (HOSPITAL_COMMUNITY): Payer: Self-pay

## 2017-12-07 ENCOUNTER — Other Ambulatory Visit: Payer: Self-pay | Admitting: Radiation Oncology

## 2017-12-07 DIAGNOSIS — C3432 Malignant neoplasm of lower lobe, left bronchus or lung: Secondary | ICD-10-CM | POA: Diagnosis not present

## 2017-12-07 DIAGNOSIS — Z510 Encounter for antineoplastic radiation therapy: Secondary | ICD-10-CM | POA: Diagnosis not present

## 2017-12-07 DIAGNOSIS — C342 Malignant neoplasm of middle lobe, bronchus or lung: Secondary | ICD-10-CM | POA: Diagnosis not present

## 2017-12-07 DIAGNOSIS — Z853 Personal history of malignant neoplasm of breast: Secondary | ICD-10-CM | POA: Diagnosis not present

## 2017-12-07 DIAGNOSIS — Z9889 Other specified postprocedural states: Secondary | ICD-10-CM | POA: Diagnosis not present

## 2017-12-07 DIAGNOSIS — M1990 Unspecified osteoarthritis, unspecified site: Secondary | ICD-10-CM | POA: Diagnosis not present

## 2017-12-07 DIAGNOSIS — D320 Benign neoplasm of cerebral meninges: Secondary | ICD-10-CM | POA: Diagnosis not present

## 2017-12-07 MED ORDER — SUCRALFATE 1 G PO TABS: 1.0000 g | ORAL_TABLET | Freq: Three times a day (TID) | ORAL | 2 refills | Status: DC

## 2017-12-08 ENCOUNTER — Ambulatory Visit
Admission: RE | Admit: 2017-12-08 | Discharge: 2017-12-08 | Disposition: A | Discharge status: Home / Self Care | Payer: Medicare Other | Source: Ambulatory Visit | Attending: Radiation Oncology | Admitting: Radiation Oncology

## 2017-12-08 DIAGNOSIS — M1990 Unspecified osteoarthritis, unspecified site: Secondary | ICD-10-CM | POA: Diagnosis not present

## 2017-12-08 DIAGNOSIS — Z853 Personal history of malignant neoplasm of breast: Secondary | ICD-10-CM | POA: Diagnosis not present

## 2017-12-08 DIAGNOSIS — Z510 Encounter for antineoplastic radiation therapy: Secondary | ICD-10-CM | POA: Diagnosis not present

## 2017-12-08 DIAGNOSIS — D320 Benign neoplasm of cerebral meninges: Secondary | ICD-10-CM | POA: Diagnosis not present

## 2017-12-08 DIAGNOSIS — C3432 Malignant neoplasm of lower lobe, left bronchus or lung: Secondary | ICD-10-CM | POA: Diagnosis not present

## 2017-12-08 DIAGNOSIS — Z9889 Other specified postprocedural states: Secondary | ICD-10-CM | POA: Diagnosis not present

## 2017-12-08 DIAGNOSIS — C342 Malignant neoplasm of middle lobe, bronchus or lung: Secondary | ICD-10-CM | POA: Diagnosis not present

## 2017-12-11 ENCOUNTER — Ambulatory Visit: Payer: Medicare Other

## 2017-12-13 ENCOUNTER — Ambulatory Visit
Admission: RE | Admit: 2017-12-13 | Discharge: 2017-12-13 | Disposition: A | Discharge status: Home / Self Care | Payer: Medicare Other | Source: Ambulatory Visit | Attending: Radiation Oncology | Admitting: Radiation Oncology

## 2017-12-13 DIAGNOSIS — Z510 Encounter for antineoplastic radiation therapy: Secondary | ICD-10-CM | POA: Diagnosis not present

## 2017-12-13 DIAGNOSIS — C3432 Malignant neoplasm of lower lobe, left bronchus or lung: Secondary | ICD-10-CM | POA: Diagnosis not present

## 2017-12-13 DIAGNOSIS — Z853 Personal history of malignant neoplasm of breast: Secondary | ICD-10-CM | POA: Diagnosis not present

## 2017-12-13 DIAGNOSIS — M1990 Unspecified osteoarthritis, unspecified site: Secondary | ICD-10-CM | POA: Diagnosis not present

## 2017-12-13 DIAGNOSIS — Z9889 Other specified postprocedural states: Secondary | ICD-10-CM | POA: Diagnosis not present

## 2017-12-13 DIAGNOSIS — D320 Benign neoplasm of cerebral meninges: Secondary | ICD-10-CM | POA: Diagnosis not present

## 2017-12-13 DIAGNOSIS — C342 Malignant neoplasm of middle lobe, bronchus or lung: Secondary | ICD-10-CM | POA: Diagnosis not present

## 2017-12-14 ENCOUNTER — Ambulatory Visit
Admission: RE | Admit: 2017-12-14 | Discharge: 2017-12-14 | Disposition: A | Discharge status: Home / Self Care | Payer: Medicare Other | Source: Ambulatory Visit | Attending: Radiation Oncology | Admitting: Radiation Oncology

## 2017-12-14 DIAGNOSIS — C3432 Malignant neoplasm of lower lobe, left bronchus or lung: Secondary | ICD-10-CM | POA: Diagnosis not present

## 2017-12-14 DIAGNOSIS — C342 Malignant neoplasm of middle lobe, bronchus or lung: Secondary | ICD-10-CM | POA: Diagnosis not present

## 2017-12-14 DIAGNOSIS — Z510 Encounter for antineoplastic radiation therapy: Secondary | ICD-10-CM | POA: Diagnosis not present

## 2017-12-14 DIAGNOSIS — Z853 Personal history of malignant neoplasm of breast: Secondary | ICD-10-CM | POA: Diagnosis not present

## 2017-12-14 DIAGNOSIS — D320 Benign neoplasm of cerebral meninges: Secondary | ICD-10-CM | POA: Diagnosis not present

## 2017-12-14 DIAGNOSIS — Z9889 Other specified postprocedural states: Secondary | ICD-10-CM | POA: Diagnosis not present

## 2017-12-14 DIAGNOSIS — M1990 Unspecified osteoarthritis, unspecified site: Secondary | ICD-10-CM | POA: Diagnosis not present

## 2017-12-15 ENCOUNTER — Ambulatory Visit
Admission: RE | Admit: 2017-12-15 | Discharge: 2017-12-15 | Disposition: A | Discharge status: Home / Self Care | Payer: Medicare Other | Source: Ambulatory Visit | Attending: Radiation Oncology | Admitting: Radiation Oncology

## 2017-12-15 DIAGNOSIS — D320 Benign neoplasm of cerebral meninges: Secondary | ICD-10-CM | POA: Diagnosis not present

## 2017-12-15 DIAGNOSIS — Z510 Encounter for antineoplastic radiation therapy: Secondary | ICD-10-CM | POA: Diagnosis not present

## 2017-12-15 DIAGNOSIS — M1990 Unspecified osteoarthritis, unspecified site: Secondary | ICD-10-CM | POA: Diagnosis not present

## 2017-12-15 DIAGNOSIS — C3432 Malignant neoplasm of lower lobe, left bronchus or lung: Secondary | ICD-10-CM | POA: Diagnosis not present

## 2017-12-15 DIAGNOSIS — Z9889 Other specified postprocedural states: Secondary | ICD-10-CM | POA: Diagnosis not present

## 2017-12-15 DIAGNOSIS — C342 Malignant neoplasm of middle lobe, bronchus or lung: Secondary | ICD-10-CM | POA: Diagnosis not present

## 2017-12-15 DIAGNOSIS — Z853 Personal history of malignant neoplasm of breast: Secondary | ICD-10-CM | POA: Diagnosis not present

## 2017-12-18 ENCOUNTER — Ambulatory Visit
Admission: RE | Admit: 2017-12-18 | Discharge: 2017-12-18 | Disposition: A | Discharge status: Home / Self Care | Payer: Medicare Other | Source: Ambulatory Visit | Attending: Radiation Oncology | Admitting: Radiation Oncology

## 2017-12-18 DIAGNOSIS — Z853 Personal history of malignant neoplasm of breast: Secondary | ICD-10-CM | POA: Diagnosis not present

## 2017-12-18 DIAGNOSIS — M1990 Unspecified osteoarthritis, unspecified site: Secondary | ICD-10-CM | POA: Diagnosis not present

## 2017-12-18 DIAGNOSIS — Z9889 Other specified postprocedural states: Secondary | ICD-10-CM | POA: Diagnosis not present

## 2017-12-18 DIAGNOSIS — Z510 Encounter for antineoplastic radiation therapy: Secondary | ICD-10-CM | POA: Diagnosis not present

## 2017-12-18 DIAGNOSIS — C342 Malignant neoplasm of middle lobe, bronchus or lung: Secondary | ICD-10-CM | POA: Diagnosis not present

## 2017-12-18 DIAGNOSIS — C3432 Malignant neoplasm of lower lobe, left bronchus or lung: Secondary | ICD-10-CM | POA: Diagnosis not present

## 2017-12-18 DIAGNOSIS — D320 Benign neoplasm of cerebral meninges: Secondary | ICD-10-CM | POA: Diagnosis not present

## 2017-12-20 ENCOUNTER — Ambulatory Visit
Admission: RE | Admit: 2017-12-20 | Discharge: 2017-12-20 | Disposition: A | Discharge status: Home / Self Care | Payer: Medicare Other | Source: Ambulatory Visit | Attending: Radiation Oncology | Admitting: Radiation Oncology

## 2017-12-20 DIAGNOSIS — C342 Malignant neoplasm of middle lobe, bronchus or lung: Secondary | ICD-10-CM | POA: Diagnosis not present

## 2017-12-20 DIAGNOSIS — D320 Benign neoplasm of cerebral meninges: Secondary | ICD-10-CM | POA: Diagnosis not present

## 2017-12-20 DIAGNOSIS — M810 Age-related osteoporosis without current pathological fracture: Secondary | ICD-10-CM | POA: Diagnosis not present

## 2017-12-20 DIAGNOSIS — Z885 Allergy status to narcotic agent status: Secondary | ICD-10-CM | POA: Diagnosis not present

## 2017-12-20 DIAGNOSIS — C3432 Malignant neoplasm of lower lobe, left bronchus or lung: Secondary | ICD-10-CM | POA: Diagnosis not present

## 2017-12-20 DIAGNOSIS — Z9889 Other specified postprocedural states: Secondary | ICD-10-CM | POA: Diagnosis not present

## 2017-12-20 DIAGNOSIS — Z510 Encounter for antineoplastic radiation therapy: Secondary | ICD-10-CM | POA: Diagnosis not present

## 2017-12-20 DIAGNOSIS — Z801 Family history of malignant neoplasm of trachea, bronchus and lung: Secondary | ICD-10-CM | POA: Diagnosis not present

## 2017-12-20 DIAGNOSIS — Z86010 Personal history of colonic polyps: Secondary | ICD-10-CM | POA: Diagnosis not present

## 2017-12-20 DIAGNOSIS — Z87891 Personal history of nicotine dependence: Secondary | ICD-10-CM | POA: Diagnosis not present

## 2017-12-20 DIAGNOSIS — Z8249 Family history of ischemic heart disease and other diseases of the circulatory system: Secondary | ICD-10-CM | POA: Diagnosis not present

## 2017-12-20 DIAGNOSIS — Z888 Allergy status to other drugs, medicaments and biological substances status: Secondary | ICD-10-CM | POA: Diagnosis not present

## 2017-12-20 DIAGNOSIS — M1990 Unspecified osteoarthritis, unspecified site: Secondary | ICD-10-CM | POA: Diagnosis not present

## 2017-12-20 DIAGNOSIS — Z79899 Other long term (current) drug therapy: Secondary | ICD-10-CM | POA: Diagnosis not present

## 2017-12-20 DIAGNOSIS — Z7982 Long term (current) use of aspirin: Secondary | ICD-10-CM | POA: Diagnosis not present

## 2017-12-20 DIAGNOSIS — Z8042 Family history of malignant neoplasm of prostate: Secondary | ICD-10-CM | POA: Diagnosis not present

## 2017-12-20 DIAGNOSIS — Z853 Personal history of malignant neoplasm of breast: Secondary | ICD-10-CM | POA: Diagnosis not present

## 2017-12-21 ENCOUNTER — Ambulatory Visit
Admission: RE | Admit: 2017-12-21 | Discharge: 2017-12-21 | Disposition: A | Discharge status: Home / Self Care | Payer: Medicare Other | Source: Ambulatory Visit | Attending: Radiation Oncology | Admitting: Radiation Oncology

## 2017-12-21 DIAGNOSIS — Z510 Encounter for antineoplastic radiation therapy: Secondary | ICD-10-CM | POA: Diagnosis not present

## 2017-12-21 DIAGNOSIS — M1990 Unspecified osteoarthritis, unspecified site: Secondary | ICD-10-CM | POA: Diagnosis not present

## 2017-12-21 DIAGNOSIS — C342 Malignant neoplasm of middle lobe, bronchus or lung: Secondary | ICD-10-CM | POA: Diagnosis not present

## 2017-12-21 DIAGNOSIS — Z853 Personal history of malignant neoplasm of breast: Secondary | ICD-10-CM | POA: Diagnosis not present

## 2017-12-21 DIAGNOSIS — D320 Benign neoplasm of cerebral meninges: Secondary | ICD-10-CM | POA: Diagnosis not present

## 2017-12-21 DIAGNOSIS — C3432 Malignant neoplasm of lower lobe, left bronchus or lung: Secondary | ICD-10-CM | POA: Diagnosis not present

## 2017-12-21 DIAGNOSIS — Z9889 Other specified postprocedural states: Secondary | ICD-10-CM | POA: Diagnosis not present

## 2017-12-22 ENCOUNTER — Ambulatory Visit
Admission: RE | Admit: 2017-12-22 | Discharge: 2017-12-22 | Disposition: A | Discharge status: Home / Self Care | Payer: Medicare Other | Source: Ambulatory Visit | Attending: Radiation Oncology | Admitting: Radiation Oncology

## 2017-12-22 DIAGNOSIS — Z853 Personal history of malignant neoplasm of breast: Secondary | ICD-10-CM | POA: Diagnosis not present

## 2017-12-22 DIAGNOSIS — Z510 Encounter for antineoplastic radiation therapy: Secondary | ICD-10-CM | POA: Diagnosis not present

## 2017-12-22 DIAGNOSIS — Z9889 Other specified postprocedural states: Secondary | ICD-10-CM | POA: Diagnosis not present

## 2017-12-22 DIAGNOSIS — C3432 Malignant neoplasm of lower lobe, left bronchus or lung: Secondary | ICD-10-CM

## 2017-12-22 DIAGNOSIS — C342 Malignant neoplasm of middle lobe, bronchus or lung: Secondary | ICD-10-CM | POA: Diagnosis not present

## 2017-12-22 DIAGNOSIS — D320 Benign neoplasm of cerebral meninges: Secondary | ICD-10-CM | POA: Diagnosis not present

## 2017-12-22 DIAGNOSIS — M1990 Unspecified osteoarthritis, unspecified site: Secondary | ICD-10-CM | POA: Diagnosis not present

## 2017-12-22 MED ORDER — SONAFINE EX EMUL
1.0000 "application " | Freq: Two times a day (BID) | CUTANEOUS | Status: DC
  Administered 2017-12-22: 1 via TOPICAL

## 2017-12-25 ENCOUNTER — Encounter (HOSPITAL_COMMUNITY): Payer: Self-pay

## 2017-12-25 ENCOUNTER — Ambulatory Visit: Payer: Medicare Other

## 2017-12-25 ENCOUNTER — Observation Stay (HOSPITAL_COMMUNITY)
Admission: EM | Admit: 2017-12-25 | Discharge: 2017-12-26 | Disposition: A | Discharge status: Home / Self Care | Payer: Medicare Other | Attending: Family Medicine | Admitting: Family Medicine

## 2017-12-25 ENCOUNTER — Emergency Department (HOSPITAL_COMMUNITY): Payer: Medicare Other

## 2017-12-25 ENCOUNTER — Other Ambulatory Visit: Payer: Self-pay

## 2017-12-25 ENCOUNTER — Encounter: Payer: Self-pay | Admitting: Radiation Oncology

## 2017-12-25 DIAGNOSIS — R0789 Other chest pain: Principal | ICD-10-CM | POA: Insufficient documentation

## 2017-12-25 DIAGNOSIS — C3432 Malignant neoplasm of lower lobe, left bronchus or lung: Secondary | ICD-10-CM | POA: Diagnosis not present

## 2017-12-25 DIAGNOSIS — Z7982 Long term (current) use of aspirin: Secondary | ICD-10-CM | POA: Diagnosis not present

## 2017-12-25 DIAGNOSIS — Z87891 Personal history of nicotine dependence: Secondary | ICD-10-CM | POA: Insufficient documentation

## 2017-12-25 DIAGNOSIS — I472 Ventricular tachycardia: Secondary | ICD-10-CM | POA: Insufficient documentation

## 2017-12-25 DIAGNOSIS — J90 Pleural effusion, not elsewhere classified: Secondary | ICD-10-CM | POA: Diagnosis not present

## 2017-12-25 DIAGNOSIS — R079 Chest pain, unspecified: Secondary | ICD-10-CM | POA: Diagnosis present

## 2017-12-25 DIAGNOSIS — Z79899 Other long term (current) drug therapy: Secondary | ICD-10-CM | POA: Insufficient documentation

## 2017-12-25 DIAGNOSIS — D649 Anemia, unspecified: Secondary | ICD-10-CM | POA: Diagnosis not present

## 2017-12-25 DIAGNOSIS — L719 Rosacea, unspecified: Secondary | ICD-10-CM | POA: Insufficient documentation

## 2017-12-25 DIAGNOSIS — Z853 Personal history of malignant neoplasm of breast: Secondary | ICD-10-CM | POA: Diagnosis not present

## 2017-12-25 DIAGNOSIS — I700 Atherosclerosis of aorta: Secondary | ICD-10-CM | POA: Insufficient documentation

## 2017-12-25 DIAGNOSIS — R0602 Shortness of breath: Secondary | ICD-10-CM | POA: Diagnosis not present

## 2017-12-25 LAB — I-STAT TROPONIN, ED: Troponin i, poc: 0.01 ng/mL (ref 0.00–0.08)

## 2017-12-25 LAB — BASIC METABOLIC PANEL
ANION GAP: 7 (ref 5–15)
BUN: 9 mg/dL (ref 6–20)
CHLORIDE: 101 mmol/L (ref 101–111)
CO2: 27 mmol/L (ref 22–32)
Calcium: 9.7 mg/dL (ref 8.9–10.3)
Creatinine, Ser: 0.64 mg/dL (ref 0.44–1.00)
GFR calc Af Amer: >60 mL/min (ref >60)
GFR calc non Af Amer: >60 mL/min (ref >60)
GLUCOSE: 122 mg/dL — AB (ref 65–99)
POTASSIUM: 4.2 mmol/L (ref 3.5–5.1)
Sodium: 135 mmol/L (ref 135–145)

## 2017-12-25 LAB — CBC
HEMATOCRIT: 40.9 % (ref 36.0–46.0)
HEMOGLOBIN: 13.5 g/dL (ref 12.0–15.0)
MCH: 27.6 pg (ref 26.0–34.0)
MCHC: 33 g/dL (ref 30.0–36.0)
MCV: 83.6 fL (ref 78.0–100.0)
Platelets: 205 10*3/uL (ref 150–400)
RBC: 4.89 MIL/uL (ref 3.87–5.11)
RDW: 14.3 % (ref 11.5–15.5)
WBC: 7.9 10*3/uL (ref 4.0–10.5)

## 2017-12-25 LAB — TROPONIN I

## 2017-12-25 MED ORDER — SODIUM CHLORIDE 0.9 % IV BOLUS (SEPSIS)
500.0000 mL | Freq: Once | INTRAVENOUS | Status: AC
  Administered 2017-12-26: 500 mL via INTRAVENOUS

## 2017-12-25 MED ORDER — IOPAMIDOL (ISOVUE-370) INJECTION 76%
INTRAVENOUS | Status: AC
  Administered 2017-12-25: 75 mL via INTRAVENOUS
  Filled 2017-12-25: qty 100

## 2017-12-25 MED ORDER — ACETAMINOPHEN 325 MG PO TABS
650.0000 mg | ORAL_TABLET | ORAL | Status: DC | PRN
  Administered 2017-12-25: 650 mg via ORAL
  Filled 2017-12-25: qty 2

## 2017-12-25 MED ORDER — ZOLPIDEM TARTRATE 5 MG PO TABS
5.0000 mg | ORAL_TABLET | Freq: Once | ORAL | Status: AC
  Administered 2017-12-25: 5 mg via ORAL
  Filled 2017-12-25: qty 1

## 2017-12-25 MED ORDER — IOPAMIDOL (ISOVUE-370) INJECTION 76%
INTRAVENOUS | Status: AC
  Filled 2017-12-25: qty 100

## 2017-12-25 MED ORDER — ONDANSETRON HCL 4 MG/2ML IJ SOLN: 4.0000 mg | Freq: Four times a day (QID) | INTRAMUSCULAR | Status: DC | PRN

## 2017-12-25 MED ORDER — IOPAMIDOL (ISOVUE-370) INJECTION 76%
100.0000 mL | Freq: Once | INTRAVENOUS | Status: AC | PRN
  Administered 2017-12-25: 80 mL via INTRAVENOUS

## 2017-12-25 MED ORDER — ENOXAPARIN SODIUM 40 MG/0.4ML ~~LOC~~ SOLN
40.0000 mg | SUBCUTANEOUS | Status: DC
  Administered 2017-12-25: 40 mg via SUBCUTANEOUS
  Filled 2017-12-25 (×2): qty 0.4

## 2017-12-25 NOTE — Care Management CC44 (Signed)
Condition Code 44 Documentation Completed  Patient Details  Name: [name] French MRN: [id number] Date of Birth: 07/16/1926   Condition Code 44 given:  Yes Patient signature on Condition Code 44 notice:  Yes Documentation of 2 MD's agreement:  Yes Code 44 added to claim:  Yes    Jane Birkel, [name], RN 12/25/2017, 5:13 PM

## 2017-12-25 NOTE — Progress Notes (Signed)
Patient presented to the clinic  this moring complaining of chest pain. States that she has been hurting since Saturday night. States that she took advil Saturday and got some relif. States that she went all day Sunday without pain.The pain came back on Sunday night around 10 pm .States that the pain is in her stermun area and across. Denies any numbness or tingling in her extremites. States that her head is hurting a little. Denies any issues with swallowing .States that she gets shortness of breath when the pain comes. States that she has mild fatigue.

## 2017-12-25 NOTE — Progress Notes (Signed)
Pt had 5 beat run of vtach, asymptomatic. On call Schorr notified and made aware. Will continue to monitor pt closely.

## 2017-12-25 NOTE — Care Management Obs Status (Signed)
[facility] NOTIFICATION   Patient Details  Name: [name] French MRN: [id number] Date of Birth: 03-21-1926   Medicare Observation Status Notification Given:  Yes    [name], [name], RN 12/25/2017, 5:13 PM

## 2017-12-25 NOTE — ED Notes (Signed)
Per cancer center-chest paint that started last night around 10-history of lung cancer-SOB due to pain

## 2017-12-25 NOTE — Progress Notes (Signed)
Patient sent to emergency room by Shona Simpson, PA-C for further evaluation. As ordered this RN call [place], RT on L1 and cancelled radiation treatment for today.

## 2017-12-25 NOTE — H&P (Addendum)
History and Physical    [name] French HEN:[id number] DOB: [birth year] DOA: 12/25/2017  Referring MD/NP/PA: Dr. Francia Greaves  PCP: Tisovec, Fransico Him, MD   Patient coming from: home  Chief Complaint: chest pain   HPI: [name] French is a 84 y.o. female with medical history significant for known lung cancer, on radiation therapy, has been determined that she is not a chemotherapy candidate, presents to [facility] long emergency department with main concern of several days duration of intermittent episodes of substernal chest pressure. Patient explains pain is located mostly in the anterior chest area, substernal area, initially intermittent but has progressed to constant pain in the past 24 hours. This has been associated with occasional exertional dyspnea, no specific alleviating factors, resolves spontaneously. Patient denies similar events in the past, no radiating symptoms, no recent sick contacts or exposures.Patient explains she was scheduled for radiation treatment today but sent emergency department due to concern of ongoing chest pain. Patient denies fevers and chills, no abdominal or urinary concerns.  ED Course: In in emergency department, patient is hemodynamically stable, vital signs notable for heart rate up to 114, respiratory rate up to 30, blood pressure 142/66. Blood work unremarkable. TRH asked to admit for further chest pain rule out.  Review of Systems:  Constitutional: Negative for fever, chills, diaphoresis, activity change, appetite change and fatigue.  HENT: Negative for ear pain, nosebleeds, congestion, facial swelling, rhinorrhea, neck pain, neck stiffness and ear discharge.   Eyes: Negative for pain, discharge, redness, itching and visual disturbance.  Respiratory: Negative for cough, choking, wheezing and stridor.   Cardiovascular: Negative for palpitations and leg swelling.  Gastrointestinal: Negative for abdominal distention.  Genitourinary: Negative for dysuria,  urgency, frequency, hematuria, flank pain, decreased urine volume, difficulty urinating and dyspareunia.  Musculoskeletal: Negative for back pain, joint swelling, arthralgias and gait problem.  Neurological: Negative for dizziness, tremors, seizures, syncope, facial asymmetry, speech difficulty, weakness, light-headedness, numbness and headaches.  Hematological: Negative for adenopathy. Does not bruise/bleed easily.  Psychiatric/Behavioral: Negative for hallucinations, behavioral problems, confusion, dysphoric mood, decreased concentration and agitation.   Past Medical History:  Diagnosis Date  . Allergic rhinitis   . Anemia    during pregnancy  . Arthritis   . Breast cancer ([place]) 05/08/13   right upper inner, invasive mammary  . Breast cancer, right breast ([place]) 04/16/2013   Underwent lumpectomy on 11/06/13. Path showed G1 ILC, 2.7 cm, neg margins, receptor+, Her2neg   . Diverticulosis   . 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 Hemorrhoids   . Hx of adenomatous colonic polyps 05/2002  . Lung cancer ([place])   . Meningioma ([place])   . Osteoporosis   . Pneumonia    walking pneumonia  . Rosacea   . 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 Use of letrozole (Femara)    neoadjuvant antiestrogen therapy with letrozole 2.5 mg daily x 7 monhts    Past Surgical History:  Procedure Laterality Date  . BREAST BIOPSY Left 1960   lt br bx/benign  . BREAST EXCISIONAL BIOPSY Left   . BREAST LUMPECTOMY Right   . BREAST LUMPECTOMY WITH NEEDLE LOCALIZATION Bilateral 11/06/2013   Procedure: BREAST LUMPECTOMY WITH NEEDLE LOCALIZATION;  Surgeon: Haywood Lasso, MD;  Location: [facility];  Service: General;  Laterality: Bilateral;  . COLONOSCOPY    . EYE SURGERY     both cataracts  . MOHS SURGERY Right    nose basal/squamous  . VIDEO BRONCHOSCOPY WITH ENDOBRONCHIAL NAVIGATION  N/A 11/01/2017   Procedure: VIDEO BRONCHOSCOPY WITH ENDOBRONCHIAL NAVIGATION;  Surgeon: [name],  MD;  Location: [place];  Service: Thoracic;  Laterality: N/A;  . WRIST SURGERY  1990   lt   Social history:  reports that she quit smoking about 39 years ago. Her smoking use included cigarettes. She has a 12.50 pack-year smoking history. she has never used smokeless tobacco. She reports that she drinks about 2.4 oz of alcohol per week. She reports that she does not use drugs.  Allergies  Allergen Reactions  . Codeine Itching  . Hydrocodone Itching and Other (See Comments)    INTOLERANCE >  Insomnia  . Neosporin [Neomycin-Bacitracin Zn-Polymyx] Rash    Family History  Problem Relation Age of Onset  . Heart disease Mother   . Prostate cancer Father   . Lung cancer Sister     Medication Sig  aspirin EC 81 MG tablet Take 81 mg daily by mouth.  pravastatin (PRAVACHOL) 40 MG tablet Take 40 mg daily by mouth.   zolpidem (AMBIEN) 5 MG tablet Take 5 mg at bedtime as needed by mouth (for sleep.).   Na Sulfate-K Sulfate-Mg Sulf 17.5-3.13-1.6 GM/177ML SOLN Suprep-Use as directed Patient not taking: Reported on 12/25/2017  sucralfate (CARAFATE) 1 g tablet Take 1 tablet (1 g total) by mouth 4 (four) times daily -  with meals and at bedtime. 5 min before meals for radiation induced esophagitis Patient not taking: Reported on 12/25/2017    Physical Exam: Vitals:   12/25/17 0947 12/25/17 1015 12/25/17 1245 12/25/17 1428  BP: (!) 156/75 131/69 125/69 (!) 142/66  Pulse: (!) 110 (!) 102 (!) 114 (!) 107  Resp: (!) 22 (!) 22 (!) 22 (!) 30  Temp: 98.4 F (36.9 C)     TempSrc: Oral     SpO2: 96% 95% 95% 93%    Constitutional: NAD, calm, comfortable Vitals:   12/25/17 0947 12/25/17 1015 12/25/17 1245 12/25/17 1428  BP: (!) 156/75 131/69 125/69 (!) 142/66  Pulse: (!) 110 (!) 102 (!) 114 (!) 107  Resp: (!) 22 (!) 22 (!) 22 (!) 30  Temp: 98.4 F (36.9 C)     TempSrc: Oral     SpO2: 96% 95% 95% 93%   Eyes: PERRL, lids and conjunctivae normal ENMT: Mucous membranes are moist. Posterior pharynx  clear of any exudate or lesions.Normal dentition.  Neck: normal, supple, no masses, no thyromegaly Respiratory: mild rhonchi at bases, no wheezing, no crackles. Normal respiratory effort. No accessory muscle use.  Cardiovascular: tachycardic, no murmurs / rubs / gallops. No extremity edema. 2+ pedal pulses. No carotid bruits.  Abdomen: no tenderness, no masses palpated. No hepatosplenomegaly. Bowel sounds positive.  Musculoskeletal: no clubbing / cyanosis. No joint deformity upper and lower extremities. Good ROM, no contractures. Normal muscle tone.  Skin: no rashes, lesions, ulcers. No induration Neurologic: CN 2-12 grossly intact. Sensation intact, DTR normal. Strength 5/5 in all 4.  Psychiatric: Normal judgment and insight. Alert and oriented x 3. Normal mood.   Labs on Admission: I have personally reviewed following labs and imaging studies  CBC: Recent Labs  Lab 12/25/17 0946  WBC 7.9  HGB 13.5  HCT 40.9  MCV 83.6  PLT 409   Basic Metabolic Panel: Recent Labs  Lab 12/25/17 0946  NA 135  K 4.2  CL 101  CO2 27  GLUCOSE 122*  BUN 9  CREATININE 0.64  CALCIUM 9.7   Radiological Exams on Admission:  Dg Chest 2 View Result Date:  12/25/2017 Moderate left pleural effusion with left lower lobe atelectasis.   Ct Angio Chest Pe W/cm &/or Wo Cm Result Date: 12/25/2017 1. No CT findings for pulmonary embolism but suboptimal opacification of the pulmonary arterial tree. 2. Interval decrease in size of the left hilar/infrahilar lung mass. 3. Persistent left lower lobe atelectasis and small to moderate size left pleural effusion. 4. New nodular air ground-glass opacity in the right middle lobe, likely inflammatory but attention on future scans is suggested. 5. Stable small mediastinal and hilar lymph nodes.   EKG: pending   Assessment/Plan Active Problems:   Chest pain - unclear etiology, may be musculoskeletal but not clear at this time - not sure if possibly related to pleural  effusion  - will admit for observation to telemetry unit - cycle CE's, provide analgesia as needed - ECHO requested  - if persists for next 24 hours, would consider further eval of pleural effusion     Left sided pleural effusion - unclear etiology and could be related to malignancy itself - I have discussed with pt possibility of doing thoracentesis but she prefers to hold off on that and see how she feels in AM, if she feels better she wants to hold off on this but if she does not feel better she would be open to further evaluation     Lung cancer - on radiation therapy - has been determined she is not chemo candidate and pt is aware of that and says she has actually done fairly well from that stand point   DVT prophylaxis: Lovenox SQ Code Status: Full  Family Communication: Pt updated at bedside Disposition Plan: will go home in 24 - 48 hours Consults called: none Admission status: observation   Faye Ramsay MD [facility] Pager [phone]  If 7PM-7AM, please contact night-coverage [email or web address] Password [email or web address]  12/25/2017, 2:52 PM

## 2017-12-25 NOTE — Progress Notes (Signed)
Shona Simpson PA talk to the charge nurse Marzetta Board in the ER and Ms. Lapid was sent over . Report was given to [place] in the ED.

## 2017-12-25 NOTE — ED Triage Notes (Signed)
Coming from  [facility]. Was scheduled for radiation (hx of lung CA) today but reported to the nurse that she has been experiencing chest pain since Saturday (intermittent). Pain was relieved with the administration of Aleve on Saturday and returned on Sunday. No radiation. (+) decreased appetite, SHOB, mild fatigue. Has been receiving radiation x 3 weeks.

## 2017-12-25 NOTE — Progress Notes (Addendum)
Pt's HR sustaining 120s at this time. On call Schorr notified and ordered a 500 mL NS IV bolus.

## 2017-12-25 NOTE — ED Notes (Signed)
ED Provider at bedside. 

## 2017-12-25 NOTE — ED Provider Notes (Signed)
[facility] DEPT Provider Note   CSN: [id number] Arrival date & time: 12/25/17  0940     History   Chief Complaint Chief Complaint  Patient presents with  . Chest Pain    HPI [name] French is a 84 y.o. female.  84 year old female with history of lung cancer (on radiation therapy), arthritis, anemia, and diverticulosis presents with complaint of chest pain.  Patient reports intermittent anterior chest pain since Saturday.  Patient reports that pain has been essentially constant for the last 20+ hours.  She does report that the pain seems to be worse with deep breathing.  She denies back pain.  She denies associated nausea, vomiting, diaphoresis, shortness of breath, or other complaint.  Patient reports that she has no prior history of CAD.  Patient was scheduled for radiation treatment today but was sent to the ED when she presented and reported chest pain.  Patient is not currently on chemo for her reported lung cancer.   The history is provided by the patient.  Chest Pain   This is a new problem. The current episode started 2 days ago. The problem occurs constantly. The problem has not changed since onset.The pain is associated with breathing. The pain is present in the substernal region. The pain is mild. The quality of the pain is described as dull. The pain does not radiate. Duration of episode(s) is 20 hours. Pertinent negatives include no cough, no fever, no leg pain, no lower extremity edema, no nausea and no vomiting. She has tried nothing for the symptoms. The treatment provided no relief.    Past Medical History:  Diagnosis Date  . Allergic rhinitis   . Anemia    during pregnancy  . Arthritis   . Breast cancer ([place]) 05/08/13   right upper inner, invasive mammary  . Breast cancer, right breast ([place]) 04/16/2013   Underwent lumpectomy on 11/06/13. Path showed G1 ILC, 2.7 cm, neg margins, receptor+, Her2neg   . Diverticulosis   . 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 Hemorrhoids   . Hx of adenomatous colonic polyps 05/2002  . Lung cancer ([place])   . Meningioma ([place])   . Osteoporosis   . Pneumonia    walking pneumonia  . Rosacea   . 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 Use of letrozole ([place])    neoadjuvant antiestrogen therapy with letrozole 2.5 mg daily x 7 monhts    Patient Active Problem List   Diagnosis Date Noted  . Abnormal findings on radiological examination of gastrointestinal tract 11/16/2017  . Primary cancer of left lower lobe of lung ([place]) 11/15/2017  . Non-small cell cancer of right lung ([place]) 11/07/2017  . Lung mass 10/17/2017  . Breast cancer of upper-inner quadrant of right female breast ([place]) 06/10/2013  . Mass of breast, left 05/31/2013  . PERSONAL HISTORY OF COLONIC POLYPS 07/26/2010    Past Surgical History:  Procedure Laterality Date  . BREAST BIOPSY Left 1960   lt br bx/benign  . BREAST EXCISIONAL BIOPSY Left   . BREAST LUMPECTOMY Right   . BREAST LUMPECTOMY WITH NEEDLE LOCALIZATION Bilateral 11/06/2013   Procedure: BREAST LUMPECTOMY WITH NEEDLE LOCALIZATION;  Surgeon: Haywood Lasso, MD;  Location: [facility];  Service: General;  Laterality: Bilateral;  . COLONOSCOPY    . EYE SURGERY     both cataracts  . MOHS SURGERY Right    nose basal/squamous  . VIDEO BRONCHOSCOPY WITH ENDOBRONCHIAL NAVIGATION N/A 11/01/2017  Procedure: VIDEO BRONCHOSCOPY WITH ENDOBRONCHIAL NAVIGATION;  Surgeon: [name], MD;  Location: [facility] OR;  Service: Thoracic;  Laterality: N/A;  . WRIST SURGERY  1990   lt    OB History    No data available       Home Medications    Prior to Admission medications   Medication Sig Start Date End Date Taking? Authorizing Provider  Artificial Tear Solution (BION TEARS OP) Place 1-2 drops 3 (three) times daily as needed into both eyes (for dry eyes.).    [provider]  Ascorbic Acid (VITAMIN C) 1000 MG tablet Take 1,000 mg daily  by mouth.    [provider]  aspirin EC 81 MG tablet Take 81 mg daily by mouth.    [provider]  cetirizine (ZYRTEC) 10 MG tablet Take 10 mg daily by mouth.     [provider]  Cholecalciferol (VITAMIN D-3) 1000 UNITS CAPS Take 1,000 Units daily by mouth.     [provider]  diclofenac sodium (VOLTAREN) 1 % GEL Apply 2-4 g 4 (four) times daily as needed topically (for shoulder/leg pain.).    [provider]  Docusate Sodium (COLACE PO) Take 1 tablet by mouth as needed. Colace 2 for 1 new at Pharmacy, OTC    [provider]  doxylamine, Sleep, (UNISOM) 25 MG tablet Take 25 mg at bedtime by mouth.    [provider]  GNP GLUCOSAMINE SULFATE PO Take 1 tablet daily by mouth.    [provider]  loperamide (IMODIUM) 2 MG capsule Take 2-4 mg 3 (three) times daily as needed by mouth for diarrhea or loose stools.    [provider]  Menthol, Topical Analgesic, (BIOFREEZE EX) Apply 1-2 application 3 (three) times daily as needed topically (for pain/muscle aches.).    [provider]  Na Sulfate-K Sulfate-Mg Sulf 17.5-3.13-1.6 GM/177ML SOLN Suprep-Use as directed 11/16/17   Zehr, Laban Emperor, PA-C  naphazoline-pheniramine (NAPHCON-A) 0.025-0.3 % ophthalmic solution Place 1-2 drops 4 (four) times daily as needed into both eyes for eye irritation or allergies.    [provider]  Olopatadine HCl (PAZEO) 0.7 % SOLN Place 1-2 drops 3 (three) times daily as needed into both eyes (for allergy eyes).    [provider]  pravastatin (PRAVACHOL) 40 MG tablet Take 40 mg daily by mouth.  03/31/13   [provider]  Probiotic Product (ALIGN) 4 MG CAPS Take 4 mg daily by mouth.    [provider]  sucralfate (CARAFATE) 1 g tablet Take 1 tablet (1 g total) by mouth 4 (four) times daily -  with meals and at bedtime. 5 min before meals for radiation induced esophagitis 12/07/17   Tyler Pita, MD   SUPER B COMPLEX & C TABS Take 1 tablet by mouth daily. 10/17/16   Nicholas Lose, MD  zolpidem (AMBIEN) 5 MG tablet Take 5 mg at bedtime as needed by mouth (for sleep.).  03/26/15   [provider]    Family History Family History  Problem Relation Age of Onset  . Heart disease Mother   . Prostate cancer Father   . Lung cancer Sister     Social History Social History   Tobacco Use  . Smoking status: Former Smoker    Packs/day: 0.50    Years: 25.00    Pack years: 12.50    Types: Cigarettes    Last attempt to quit: 12/19/1978    Years since quitting: 39.0  . Smokeless  tobacco: Never Used  Substance Use Topics  . Alcohol use: Yes    Alcohol/week: 2.4 oz    Types: 4 Glasses of wine per week  . Drug use: No     Allergies   Codeine; Hydrocodone; and Neosporin [neomycin-bacitracin zn-polymyx]   Review of Systems Review of Systems  Constitutional: Negative for fever.  Respiratory: Negative for cough.   Cardiovascular: Positive for chest pain.  Gastrointestinal: Negative for nausea and vomiting.  All other systems reviewed and are negative.    Physical Exam Updated Vital Signs BP (!) 156/75 (BP Location: Right Arm)   Pulse (!) 110   Temp 98.4 F (36.9 C) (Oral)   Resp (!) 22   SpO2 96%   Physical Exam  Constitutional: She is oriented to person, place, and time. She appears well-developed and well-nourished. No distress.  HENT:  Head: Normocephalic and atraumatic.  Mouth/Throat: Oropharynx is clear and moist.  Eyes: Conjunctivae and EOM are normal. Pupils are equal, round, and reactive to light.  Neck: Normal range of motion. Neck supple.  Cardiovascular: Normal rate, regular rhythm and normal heart sounds.  Pulmonary/Chest: Effort normal and breath sounds normal. No respiratory distress.  Abdominal: Soft. She exhibits no distension. There is no tenderness.  Musculoskeletal: Normal range of motion. She exhibits no edema or deformity.  Neurological: She is  alert and oriented to person, place, and time.  Skin: Skin is warm and dry.  Psychiatric: She has a normal mood and affect.  Nursing note and vitals reviewed.    ED Treatments / Results  Labs (all labs ordered are listed, but only abnormal results are displayed) Labs Reviewed  CBC  BASIC METABOLIC PANEL  I-STAT TROPONIN, ED    EKG  EKG Interpretation  Date/Time:  Monday December 25 2017 09:44:24 EST Ventricular Rate:  109 PR Interval:    QRS Duration: 73 QT Interval:  332 QTC Calculation: 447 R Axis:   69 Text Interpretation:  Sinus tachycardia Low voltage, precordial leads ST elevation, consider inferior injury Confirmed by Dene Gentry [phone]) on 12/25/2017 10:00:07 AM       Radiology No results found.  Procedures Procedures (including critical care time)  Medications Ordered in ED Medications - No data to display   Initial Impression / Assessment and Plan / ED Course  I have reviewed the triage vital signs and the nursing notes.  Pertinent labs & imaging results that were available during my care of the patient were reviewed by me and considered in my medical decision making (see chart for details).     MDM Screen complete  Patient is presenting with atypical chest pain.  Initial EKG and troponin are not suggestive of ACS.  CTA is negative for a large PE. Patient will be admitted to the hospitalist service for further workup.   [facility] aware of case and will evaluate for admission.   Final Clinical Impressions(s) / ED Diagnoses   Final diagnoses:  Chest pain, unspecified type    ED Discharge Orders    None       Valarie Merino, MD 12/25/17 1435

## 2017-12-26 ENCOUNTER — Observation Stay (HOSPITAL_BASED_OUTPATIENT_CLINIC_OR_DEPARTMENT_OTHER): Payer: Medicare Other

## 2017-12-26 ENCOUNTER — Ambulatory Visit
Admission: RE | Admit: 2017-12-26 | Discharge: 2017-12-26 | Disposition: A | Discharge status: Home / Self Care | Payer: Medicare Other | Source: Ambulatory Visit | Attending: Radiation Oncology | Admitting: Radiation Oncology

## 2017-12-26 DIAGNOSIS — R079 Chest pain, unspecified: Secondary | ICD-10-CM

## 2017-12-26 DIAGNOSIS — R0789 Other chest pain: Secondary | ICD-10-CM | POA: Diagnosis not present

## 2017-12-26 DIAGNOSIS — J90 Pleural effusion, not elsewhere classified: Secondary | ICD-10-CM | POA: Diagnosis not present

## 2017-12-26 DIAGNOSIS — I351 Nonrheumatic aortic (valve) insufficiency: Secondary | ICD-10-CM

## 2017-12-26 DIAGNOSIS — C3432 Malignant neoplasm of lower lobe, left bronchus or lung: Secondary | ICD-10-CM | POA: Diagnosis not present

## 2017-12-26 DIAGNOSIS — I472 Ventricular tachycardia: Secondary | ICD-10-CM | POA: Diagnosis not present

## 2017-12-26 LAB — ECHOCARDIOGRAM COMPLETE
Height: 65 in
Weight: 2144 oz

## 2017-12-26 LAB — BASIC METABOLIC PANEL
Anion gap: 6 (ref 5–15)
BUN: 12 mg/dL (ref 6–20)
CHLORIDE: 105 mmol/L (ref 101–111)
CO2: 26 mmol/L (ref 22–32)
Calcium: 9 mg/dL (ref 8.9–10.3)
Creatinine, Ser: 0.85 mg/dL (ref 0.44–1.00)
GFR calc Af Amer: >60 mL/min (ref >60)
GFR calc non Af Amer: 59 mL/min — ABNORMAL LOW (ref >60)
GLUCOSE: 109 mg/dL — AB (ref 65–99)
POTASSIUM: 3.6 mmol/L (ref 3.5–5.1)
Sodium: 137 mmol/L (ref 135–145)

## 2017-12-26 LAB — LIPID PANEL
CHOL/HDL RATIO: 3.1 ratio
Cholesterol: 158 mg/dL (ref 0–200)
HDL: 51 mg/dL (ref >40)
LDL CALC: 74 mg/dL (ref 0–99)
TRIGLYCERIDES: 167 mg/dL — AB (ref <150)
VLDL: 33 mg/dL (ref 0–40)

## 2017-12-26 LAB — TROPONIN I

## 2017-12-26 LAB — HEMOGLOBIN A1C
HEMOGLOBIN A1C: 5.6 % (ref 4.8–5.6)
Mean Plasma Glucose: 114.02 mg/dL

## 2017-12-26 MED ORDER — METOPROLOL TARTRATE 25 MG PO TABS: 12.5000 mg | ORAL_TABLET | Freq: Two times a day (BID) | ORAL | 0 refills | Status: DC

## 2017-12-26 MED ORDER — PRAVASTATIN SODIUM 40 MG PO TABS: 40.0000 mg | ORAL_TABLET | Freq: Every day | ORAL | Status: DC

## 2017-12-26 NOTE — Progress Notes (Addendum)
Delayed Entry:  The patient was seen yesterday in the clinic due to ongoing concerns with chest pain that began over the weekend.  She states that she noted an episode of pain in her sternal region Saturday evening, she reports that she took ibuprofen, set up for several hours and that this went away.  She states that she did well Sunday morning but then Sunday evening around 10:00 when she was sitting resting in a chair she developed sternal chest pain again.  It has persisted through the night and into the morning, and she states that she was unable to lay flat due to discomfort.  She did take ibuprofen that did not seem to influence her symptoms.  She feels as though her discomfort has somewhat lessened but is still persistent.  She reports that she has had some shortness of breath when trying to move about during these times.  Reports that she is unable to lay flat for treatment today because of the discomfort, and reports that she has not had any fevers or chills.  She continues to have an ongoing cough but reports this is not become more productive and denies any cyst.  She is in the midst of radiotherapy treatment, and denies any concerns with esophagitis symptoms of dysphagia or odynophagia.  She is able to eat what she enjoys.  When her chest pain comes on she denies any radiation of symptoms into her shoulder or jaw.  She does state that she has had several episodes of breaking into a sweat.  No other complaints are verbalized.   PE:  Wt Readings from Last 3 Encounters:  12/26/17 134 lb (60.8 kg)  11/16/17 134 lb 4 oz (60.9 kg)  11/15/17 135 lb 3.2 oz (61.3 kg)   Temp Readings from Last 3 Encounters:  12/26/17 98.9 F (37.2 C) (Oral)  12/25/17 97.8 F (36.6 C) (Oral)  11/15/17 98.2 F (36.8 C) (Oral)   BP Readings from Last 3 Encounters:  12/26/17 (!) 113/56  12/25/17 (!) 141/72  11/16/17 118/68   Pulse Readings from Last 3 Encounters:  12/26/17 95  12/25/17 (!) 106  11/16/17 66    In general this is a well appearing elderyly female in no acute distress. She is alert and oriented x4 and appropriate throughout the examination. HEENT reveals that the patient is normocephalic, atraumatic. EOMs are intact. PERRLA. Skin is intact without any evidence of gross lesions. Cardiovascular exam reveals a  tachycardic rate and regular rhythm, no clicks rubs or murmurs are auscultated. Chest is clear to auscultation bilaterally at the apices, though decreased breath sounds are noted in the left lung base. Lymphatic assessment is performed and does not reveal any adenopathy in the cervical, supraclavicular, axillary, or inguinal chains. Abdomen has active bowel sounds in all quadrants and is intact. The abdomen is soft, non tender, non distended. Lower extremities are negative for pretibial pitting edema, deep calf tenderness, cyanosis or clubbing.  A/P: 1. Chest pain in the setting of lung cancer with Left upper lobe involvement and some evidence of collapse with left pleural effusion.  The patient's case is somewhat complicated by the fact that she has a known pleural effusion, known right upper lobe tumor, with mediastinal adenopathy, and has been undergoing treatment for this.  Although she is not specifically having symptoms of esophagitis, I have encouraged her to begin Carafate to see if this plays any role in her symptoms.  Given the severity of her symptoms, and her tachycardia on examination, and  hypotension and her vitals, I would like to rule out PE, MI, or progression of her effusion.  We discussed the importance of being evaluated in an urgent manner, and that if she was having an MI, she would need to be cared for in the emergency room.  We will transfer her down, I have spoken with the charge nurse Stacy regarding her case and she will be taken to recess 3 for evaluation.    Carola Rhine, PAC

## 2017-12-26 NOTE — Progress Notes (Signed)
  Echocardiogram 2D Echocardiogram has been performed.  Darlina Sicilian M 12/26/2017, 1:02 PM

## 2017-12-26 NOTE — Progress Notes (Signed)
Went over discharge papers with patient.  All questions answered.  Paperwork given to patient.  Wheeled out via NT.

## 2017-12-26 NOTE — Care Management Note (Signed)
Case Management Note  Patient Details  Name: [name] French MRN: [id number] Date of Birth: 1926/01/18  Subjective/Objective: 84 y/o f admitted w/chest pain. From [facility]. Pt cons-await recc.                   Action/Plan:d/c plan home.   Expected Discharge Date:  (unknown)               Expected Discharge Plan:  Home/Self Care  In-House Referral:     Discharge planning Services  CM Consult  Post Acute Care Choice:    Choice offered to:     DME Arranged:    DME Agency:     HH Arranged:    HH Agency:     Status of Service:  In process, will continue to follow  If discussed at Long Length of Stay Meetings, dates discussed:    Additional Comments:  Dessa Phi, RN 12/26/2017, 2:55 PM

## 2017-12-26 NOTE — Discharge Summary (Signed)
Physician Discharge Summary  [name] French MEQ:[id number] DOB: 02/15/1926 DOA: 12/25/2017  PCP: Haywood Pao, MD  Admit date: 12/25/2017 Discharge date: 12/26/2017  Time spent: over 30 minutes  Recommendations for Outpatient Follow-up:  1. Follow up outpatient CBC/CMP 2. Follow up outpatient EKG and HR, consider titration of metop 3. Consider outpatient stress test.   4. Consider thoracentesis 5. Follow up nodular ground glass opacitiy   Discharge Diagnoses:  Active Problems:   Chest pain   Discharge Condition: stable  Diet recommendation: heart healthy  Filed Weights   12/26/17 0610  Weight: 60.8 kg (134 lb)    History of present illness:  [name] French is a 84 y.o. female with medical history significant for known lung cancer, on radiation therapy, has been determined that she is not a chemotherapy candidate, presents to [facility] emergency department with main concern of several days duration of intermittent episodes of substernal chest pressure. Patient explains pain is located mostly in the anterior chest area, substernal area, initially intermittent but has progressed to constant pain in the past 24 hours. This has been associated with occasional exertional dyspnea, no specific alleviating factors, resolves spontaneously. Patient denies similar events in the past, no radiating symptoms, no recent sick contacts or exposures.Patient explains she was scheduled for radiation treatment today but sent emergency department due to concern of ongoing chest pain. Patient denies fevers and chills, no abdominal or urinary concerns.  Hospital Course:  Chest pain - unclear etiology, may be musculoskeletal but not clear at this time (it did improve with NSAIDs and APAP at one point, but not consistently) - atypical in description, no exertional component and not necessarily relieved by rest - she does have pleural effusion, but lower suspicion this is contributing  - CT without  PE - negative troponins, repeat EKG with NSR and PAC's - echo with normal EF - consider outpatient stress test  Tachycardia  Vtach: - pt with short run of vtach here and some tachycardia as well, discharged with metoprolol    Left sided pleural effusion - unclear etiology and could be related to malignancy itself - consider thoracentesis in future    Lung cancer - on radiation therapy - has been determined she is not chemo candidate and pt is aware of that and says she has actually done fairly well from that stand point   Procedures: Echo Study Conclusions  - Left ventricle: The cavity size was normal. Systolic function was   normal. The estimated ejection fraction was in the range of 60%   to 65%. Wall motion was normal; there were no regional wall   motion abnormalities. Left ventricular diastolic function   parameters were normal. - Aortic valve: There was mild regurgitation. - Atrial septum: No defect or patent foramen ovale was identified.   Consultations:  none  Discharge Exam: Vitals:   12/26/17 0018 12/26/17 0449  BP:  (!) 113/56  Pulse: (!) 102 95  Resp:    Temp:  98.9 F (37.2 C)  SpO2:  91%   No CP.  Feeling better.  Would like to go home.  General: No acute distress. Cardiovascular: Heart sounds show a regular rate, and rhythm. No gallops or rubs. No murmurs. No JVD.  No TTP.  Lungs: Clear to auscultation bilaterally with good air movement. No rales, rhonchi or wheezes. Abdomen: Soft, nontender, nondistended with normal active bowel sounds. No masses. No hepatosplenomegaly. Neurological: Alert and oriented 3. Moves all extremities 4 with equal strength. Cranial nerves  II through XII grossly intact. Skin: Warm and dry. No rashes or lesions. Extremities: No clubbing or cyanosis. No edema. Pedal pulses 2+. Psychiatric: Mood and affect are normal. Insight and judgment are appropriate.   Discharge Instructions   Discharge Instructions    Call MD  for:  difficulty breathing, headache or visual disturbances   Complete by:  As directed    Call MD for:  persistant dizziness or light-headedness   Complete by:  As directed    Call MD for:  persistant nausea and vomiting   Complete by:  As directed    Call MD for:  temperature >100.4   Complete by:  As directed    Diet - low sodium heart healthy   Complete by:  As directed    Discharge instructions   Complete by:  As directed    You were seen for chest pain.  Since this got better with advil and tylenol, this may have been musculoskeletal pain, but it's still a bit unclear.  Your echocardiogram looked normal and you had negative heart enzymes.  Your heart rate was fast at times.  We'll send you home with some metoprolol.  They saw a suspected inflammatory nodule on the CT scan, this should be followed up on repeat imaging.  Please follow up with Dr. Lindi Adie or your PCP within about a week.  You could consider a stress test after discharge.  Follow up with radiation oncology as scheduled.  Return if you have new, recurrent, or worsening symptoms.  Please ask your follow up provider to request records from this hospitalization so they know what was done.   Increase activity slowly   Complete by:  As directed      Allergies as of 12/26/2017      Reactions   Codeine Itching   Hydrocodone Itching, Other (See Comments)   INTOLERANCE >  Insomnia   Neosporin [neomycin-bacitracin Zn-polymyx] Rash      Medication List    TAKE these medications   ALIGN 4 MG Caps Take 4 mg daily by mouth.   aspirin EC 81 MG tablet Take 81 mg daily by mouth.   BIOFREEZE EX Apply 1-2 application 3 (three) times daily as needed topically (for pain/muscle aches.).   cetirizine 10 MG tablet Commonly known as:  ZYRTEC Take 10 mg daily by mouth.   diclofenac sodium 1 % Gel Commonly known as:  VOLTAREN Apply 2-4 g 4 (four) times daily as needed topically (for shoulder/leg pain.).   GNP GLUCOSAMINE SULFATE  PO Take 1 tablet daily by mouth.   ibuprofen 200 MG tablet Commonly known as:  ADVIL,MOTRIN Take 200 mg by mouth 2 (two) times daily as needed (PAIN).   metoprolol tartrate 25 MG tablet Commonly known as:  LOPRESSOR Take 0.5 tablets (12.5 mg total) by mouth 2 (two) times daily.   Na Sulfate-K Sulfate-Mg Sulf 17.5-3.13-1.6 GM/177ML Soln Suprep-Use as directed   naphazoline-pheniramine 0.025-0.3 % ophthalmic solution Commonly known as:  NAPHCON-A Place 1-2 drops 4 (four) times daily as needed into both eyes for eye irritation or allergies.   naproxen sodium 220 MG tablet Commonly known as:  ALEVE Take 220 mg by mouth 2 (two) times daily as needed (ARTHRITIS PAIN).   PAZEO 0.7 % Soln Generic drug:  Olopatadine HCl Place 1-2 drops 3 (three) times daily as needed into both eyes (for allergy eyes).   pravastatin 40 MG tablet Commonly known as:  PRAVACHOL Take 40 mg daily by mouth.   sucralfate 1 g  tablet Commonly known as:  CARAFATE Take 1 tablet (1 g total) by mouth 4 (four) times daily -  with meals and at bedtime. 5 min before meals for radiation induced esophagitis   SUPER B COMPLEX & C Tabs Take 1 tablet by mouth daily.   UNISOM 25 MG tablet Generic drug:  doxylamine (Sleep) Take 25 mg at bedtime by mouth.   Vitamin D-3 1000 units Caps Take 1,000 Units daily by mouth.   zolpidem 5 MG tablet Commonly known as:  AMBIEN Take 5 mg at bedtime as needed by mouth (for sleep.).      Allergies  Allergen Reactions  . Codeine Itching  . Hydrocodone Itching and Other (See Comments)    INTOLERANCE >  Insomnia  . Neosporin [Neomycin-Bacitracin Zn-Polymyx] Rash      The results of significant diagnostics from this hospitalization (including imaging, microbiology, ancillary and laboratory) are listed below for reference.    Significant Diagnostic Studies: Dg Chest 2 View  Result Date: 12/25/2017 CLINICAL DATA:  Chest pain, shortness of breath. EXAM: CHEST  2 VIEW  COMPARISON:  11/01/2017 FINDINGS: Moderate left pleural effusion with left lower lobe atelectasis. No right effusion. Heart is normal size. No acute bony abnormality. IMPRESSION: Moderate left pleural effusion with left lower lobe atelectasis. Electronically Signed   By: Rolm Baptise M.D.   On: 12/25/2017 10:14   Ct Angio Chest Pe W/cm &/or Wo Cm  Result Date: 12/25/2017 CLINICAL DATA:  Intermittent chest pain since Saturday. History of lung cancer, undergoing radiation therapy. EXAM: CT ANGIOGRAPHY CHEST WITH CONTRAST TECHNIQUE: Multidetector CT imaging of the chest was performed using the standard protocol during bolus administration of intravenous contrast. Multiplanar CT image reconstructions and MIPs were obtained to evaluate the vascular anatomy. CONTRAST:  61mL ISOVUE-370 IOPAMIDOL (ISOVUE-370) INJECTION 76%, 75mL ISOVUE-370 IOPAMIDOL (ISOVUE-370) INJECTION 76% COMPARISON:  Chest CT 10/11/2017 and PET-CT 10/20/2017 FINDINGS: Cardiovascular: The heart is normal in size. No pericardial effusion. Stable aortic and coronary artery calcifications. The pulmonary arterial tree is suboptimally opacified but no obvious large or central filling defects to suggest pulmonary embolism. Mediastinum/Nodes: Stable small scattered mediastinal and hilar lymph nodes. Prevascular lymph node on image number 33 measures 6 mm hand precarinal lymph node on image number 33 measures 7 mm. The esophagus is grossly normal. Lungs/Pleura: Lesion left infrahilar mass is smaller. It measures approximately 3.4 x 2.9 cm on image number 45. The previously measured 5.4 x 4.8 cm. Persistence endobronchial soft tissue density and mass effect on the lingula and lower lobe bronchi. Associated left lower lobe atelectasis and a persistent small to moderate size left pleural effusion. 14.5 x 10 mm right middle lobe nodular ground-glass opacity is new since the prior study and this most likely inflammatory. No worrisome pulmonary nodules to suggest  pulmonary metastatic disease. Upper Abdomen: No significant upper abdominal findings but very little of the upper abdomen is image. Musculoskeletal: No breast masses, supraclavicular or axillary adenopathy. The thyroid gland appears normal. The bony thorax is intact. No worrisome bone lesions or acute bony findings. Review of the MIP images confirms the above findings. IMPRESSION: 1. No CT findings for pulmonary embolism but suboptimal opacification of the pulmonary arterial tree. 2. Interval decrease in size of the left hilar/infrahilar lung mass. 3. Persistent left lower lobe atelectasis and small to moderate size left pleural effusion. 4. New nodular air ground-glass opacity in the right middle lobe, likely inflammatory but attention on future scans is suggested. 5. Stable small mediastinal and hilar lymph nodes.  Aortic Atherosclerosis (ICD10-I70.0) and Emphysema (ICD10-J43.9). Electronically Signed   By: Marijo Sanes M.D.   On: 12/25/2017 12:56    Microbiology: No results found for this or any previous visit (from the past 240 hour(s)).   Labs: Basic Metabolic Panel: Recent Labs  Lab 12/25/17 0946 12/26/17 0025  NA 135 137  K 4.2 3.6  CL 101 105  CO2 27 26  GLUCOSE 122* 109*  BUN 9 12  CREATININE 0.64 0.85  CALCIUM 9.7 9.0   Liver Function Tests: No results for input(s): AST, ALT, ALKPHOS, BILITOT, PROT, ALBUMIN in the last 168 hours. No results for input(s): LIPASE, AMYLASE in the last 168 hours. No results for input(s): AMMONIA in the last 168 hours. CBC: Recent Labs  Lab 12/25/17 0946  WBC 7.9  HGB 13.5  HCT 40.9  MCV 83.6  PLT 205   Cardiac Enzymes: Recent Labs  Lab 12/25/17 1835 12/25/17 2115 12/26/17 0025  TROPONINI <0.03 <0.03 <0.03   BNP: BNP (last 3 results) No results for input(s): BNP in the last 8760 hours.  ProBNP (last 3 results) No results for input(s): PROBNP in the last 8760 hours.  CBG: No results for input(s): GLUCAP in the last 168  hours.     Signed:  Fayrene Helper MD.  [facility] 12/26/2017, 11:38 PM

## 2017-12-27 ENCOUNTER — Other Ambulatory Visit: Payer: Self-pay | Admitting: Radiation Oncology

## 2017-12-27 ENCOUNTER — Ambulatory Visit
Admission: RE | Admit: 2017-12-27 | Discharge: 2017-12-27 | Disposition: A | Discharge status: Home / Self Care | Payer: Medicare Other | Source: Ambulatory Visit | Attending: Radiation Oncology | Admitting: Radiation Oncology

## 2017-12-27 DIAGNOSIS — D320 Benign neoplasm of cerebral meninges: Secondary | ICD-10-CM | POA: Diagnosis not present

## 2017-12-27 DIAGNOSIS — C342 Malignant neoplasm of middle lobe, bronchus or lung: Secondary | ICD-10-CM | POA: Diagnosis not present

## 2017-12-27 DIAGNOSIS — C3432 Malignant neoplasm of lower lobe, left bronchus or lung: Secondary | ICD-10-CM | POA: Diagnosis not present

## 2017-12-27 DIAGNOSIS — Z510 Encounter for antineoplastic radiation therapy: Secondary | ICD-10-CM | POA: Diagnosis not present

## 2017-12-27 DIAGNOSIS — Z853 Personal history of malignant neoplasm of breast: Secondary | ICD-10-CM | POA: Diagnosis not present

## 2017-12-27 DIAGNOSIS — M1990 Unspecified osteoarthritis, unspecified site: Secondary | ICD-10-CM | POA: Diagnosis not present

## 2017-12-27 DIAGNOSIS — Z9889 Other specified postprocedural states: Secondary | ICD-10-CM | POA: Diagnosis not present

## 2017-12-27 MED ORDER — RANITIDINE HCL 75 MG PO TABS: 75.0000 mg | ORAL_TABLET | Freq: Two times a day (BID) | ORAL | 5 refills | Status: DC

## 2017-12-27 NOTE — Progress Notes (Signed)
Notes reviewed with recent sub-sternal chest pain:  Thoracic radiation can cause: More likely - Esophagitis exacerbated by reflux Less likely - Acute pericarditis.  I will add Zantac and encourage she use Carafate and follow.

## 2017-12-28 ENCOUNTER — Ambulatory Visit
Admission: RE | Admit: 2017-12-28 | Discharge: 2017-12-28 | Disposition: A | Discharge status: Home / Self Care | Payer: Medicare Other | Source: Ambulatory Visit | Attending: Radiation Oncology | Admitting: Radiation Oncology

## 2017-12-28 DIAGNOSIS — Z853 Personal history of malignant neoplasm of breast: Secondary | ICD-10-CM | POA: Diagnosis not present

## 2017-12-28 DIAGNOSIS — M1990 Unspecified osteoarthritis, unspecified site: Secondary | ICD-10-CM | POA: Diagnosis not present

## 2017-12-28 DIAGNOSIS — Z510 Encounter for antineoplastic radiation therapy: Secondary | ICD-10-CM | POA: Diagnosis not present

## 2017-12-28 DIAGNOSIS — C342 Malignant neoplasm of middle lobe, bronchus or lung: Secondary | ICD-10-CM | POA: Diagnosis not present

## 2017-12-28 DIAGNOSIS — C3432 Malignant neoplasm of lower lobe, left bronchus or lung: Secondary | ICD-10-CM | POA: Diagnosis not present

## 2017-12-28 DIAGNOSIS — Z9889 Other specified postprocedural states: Secondary | ICD-10-CM | POA: Diagnosis not present

## 2017-12-28 DIAGNOSIS — D320 Benign neoplasm of cerebral meninges: Secondary | ICD-10-CM | POA: Diagnosis not present

## 2017-12-29 ENCOUNTER — Other Ambulatory Visit: Payer: Self-pay | Admitting: Radiation Therapy

## 2017-12-29 ENCOUNTER — Ambulatory Visit
Admission: RE | Admit: 2017-12-29 | Discharge: 2017-12-29 | Disposition: A | Discharge status: Home / Self Care | Payer: Medicare Other | Source: Ambulatory Visit | Attending: Radiation Oncology | Admitting: Radiation Oncology

## 2017-12-29 DIAGNOSIS — M1990 Unspecified osteoarthritis, unspecified site: Secondary | ICD-10-CM | POA: Diagnosis not present

## 2017-12-29 DIAGNOSIS — Z510 Encounter for antineoplastic radiation therapy: Secondary | ICD-10-CM | POA: Diagnosis not present

## 2017-12-29 DIAGNOSIS — Z853 Personal history of malignant neoplasm of breast: Secondary | ICD-10-CM | POA: Diagnosis not present

## 2017-12-29 DIAGNOSIS — D320 Benign neoplasm of cerebral meninges: Secondary | ICD-10-CM | POA: Diagnosis not present

## 2017-12-29 DIAGNOSIS — C3432 Malignant neoplasm of lower lobe, left bronchus or lung: Secondary | ICD-10-CM | POA: Diagnosis not present

## 2017-12-29 DIAGNOSIS — C342 Malignant neoplasm of middle lobe, bronchus or lung: Secondary | ICD-10-CM | POA: Diagnosis not present

## 2017-12-29 DIAGNOSIS — Z9889 Other specified postprocedural states: Secondary | ICD-10-CM | POA: Diagnosis not present

## 2017-12-29 DIAGNOSIS — D329 Benign neoplasm of meninges, unspecified: Secondary | ICD-10-CM

## 2018-01-01 ENCOUNTER — Ambulatory Visit
Admission: RE | Admit: 2018-01-01 | Discharge: 2018-01-01 | Disposition: A | Discharge status: Home / Self Care | Payer: Medicare Other | Source: Ambulatory Visit | Attending: Radiation Oncology | Admitting: Radiation Oncology

## 2018-01-01 DIAGNOSIS — Z853 Personal history of malignant neoplasm of breast: Secondary | ICD-10-CM | POA: Diagnosis not present

## 2018-01-01 DIAGNOSIS — C3432 Malignant neoplasm of lower lobe, left bronchus or lung: Secondary | ICD-10-CM | POA: Diagnosis not present

## 2018-01-01 DIAGNOSIS — Z9889 Other specified postprocedural states: Secondary | ICD-10-CM | POA: Diagnosis not present

## 2018-01-01 DIAGNOSIS — D320 Benign neoplasm of cerebral meninges: Secondary | ICD-10-CM | POA: Diagnosis not present

## 2018-01-01 DIAGNOSIS — M1990 Unspecified osteoarthritis, unspecified site: Secondary | ICD-10-CM | POA: Diagnosis not present

## 2018-01-01 DIAGNOSIS — Z510 Encounter for antineoplastic radiation therapy: Secondary | ICD-10-CM | POA: Diagnosis not present

## 2018-01-01 DIAGNOSIS — C342 Malignant neoplasm of middle lobe, bronchus or lung: Secondary | ICD-10-CM | POA: Diagnosis not present

## 2018-01-02 ENCOUNTER — Ambulatory Visit
Admission: RE | Admit: 2018-01-02 | Discharge: 2018-01-02 | Disposition: A | Discharge status: Home / Self Care | Payer: Medicare Other | Source: Ambulatory Visit | Attending: Radiation Oncology | Admitting: Radiation Oncology

## 2018-01-02 DIAGNOSIS — Z510 Encounter for antineoplastic radiation therapy: Secondary | ICD-10-CM | POA: Diagnosis not present

## 2018-01-02 DIAGNOSIS — Z853 Personal history of malignant neoplasm of breast: Secondary | ICD-10-CM | POA: Diagnosis not present

## 2018-01-02 DIAGNOSIS — C3432 Malignant neoplasm of lower lobe, left bronchus or lung: Secondary | ICD-10-CM | POA: Diagnosis not present

## 2018-01-02 DIAGNOSIS — C342 Malignant neoplasm of middle lobe, bronchus or lung: Secondary | ICD-10-CM | POA: Diagnosis not present

## 2018-01-02 DIAGNOSIS — M1990 Unspecified osteoarthritis, unspecified site: Secondary | ICD-10-CM | POA: Diagnosis not present

## 2018-01-02 DIAGNOSIS — Z9889 Other specified postprocedural states: Secondary | ICD-10-CM | POA: Diagnosis not present

## 2018-01-02 DIAGNOSIS — D320 Benign neoplasm of cerebral meninges: Secondary | ICD-10-CM | POA: Diagnosis not present

## 2018-01-03 ENCOUNTER — Ambulatory Visit
Admission: RE | Admit: 2018-01-03 | Discharge: 2018-01-03 | Disposition: A | Discharge status: Home / Self Care | Payer: Medicare Other | Source: Ambulatory Visit | Attending: Radiation Oncology | Admitting: Radiation Oncology

## 2018-01-03 DIAGNOSIS — C342 Malignant neoplasm of middle lobe, bronchus or lung: Secondary | ICD-10-CM | POA: Diagnosis not present

## 2018-01-03 DIAGNOSIS — Z510 Encounter for antineoplastic radiation therapy: Secondary | ICD-10-CM | POA: Diagnosis not present

## 2018-01-03 DIAGNOSIS — D320 Benign neoplasm of cerebral meninges: Secondary | ICD-10-CM | POA: Diagnosis not present

## 2018-01-03 DIAGNOSIS — M1990 Unspecified osteoarthritis, unspecified site: Secondary | ICD-10-CM | POA: Diagnosis not present

## 2018-01-03 DIAGNOSIS — C3432 Malignant neoplasm of lower lobe, left bronchus or lung: Secondary | ICD-10-CM | POA: Diagnosis not present

## 2018-01-03 DIAGNOSIS — Z853 Personal history of malignant neoplasm of breast: Secondary | ICD-10-CM | POA: Diagnosis not present

## 2018-01-03 DIAGNOSIS — Z9889 Other specified postprocedural states: Secondary | ICD-10-CM | POA: Diagnosis not present

## 2018-01-04 ENCOUNTER — Ambulatory Visit
Admission: RE | Admit: 2018-01-04 | Discharge: 2018-01-04 | Disposition: A | Discharge status: Home / Self Care | Payer: Medicare Other | Source: Ambulatory Visit | Attending: Radiation Oncology | Admitting: Radiation Oncology

## 2018-01-04 ENCOUNTER — Other Ambulatory Visit: Payer: Self-pay | Admitting: Radiation Therapy

## 2018-01-04 DIAGNOSIS — C342 Malignant neoplasm of middle lobe, bronchus or lung: Secondary | ICD-10-CM | POA: Diagnosis not present

## 2018-01-04 DIAGNOSIS — C3432 Malignant neoplasm of lower lobe, left bronchus or lung: Secondary | ICD-10-CM | POA: Diagnosis not present

## 2018-01-04 DIAGNOSIS — D320 Benign neoplasm of cerebral meninges: Secondary | ICD-10-CM | POA: Diagnosis not present

## 2018-01-04 DIAGNOSIS — M1990 Unspecified osteoarthritis, unspecified site: Secondary | ICD-10-CM | POA: Diagnosis not present

## 2018-01-04 DIAGNOSIS — Z9889 Other specified postprocedural states: Secondary | ICD-10-CM | POA: Diagnosis not present

## 2018-01-04 DIAGNOSIS — Z510 Encounter for antineoplastic radiation therapy: Secondary | ICD-10-CM | POA: Diagnosis not present

## 2018-01-04 DIAGNOSIS — Z853 Personal history of malignant neoplasm of breast: Secondary | ICD-10-CM | POA: Diagnosis not present

## 2018-01-04 DIAGNOSIS — D329 Benign neoplasm of meninges, unspecified: Secondary | ICD-10-CM

## 2018-01-05 ENCOUNTER — Telehealth: Payer: Self-pay | Admitting: Gastroenterology

## 2018-01-05 ENCOUNTER — Ambulatory Visit
Admission: RE | Admit: 2018-01-05 | Discharge: 2018-01-05 | Disposition: A | Discharge status: Home / Self Care | Payer: Medicare Other | Source: Ambulatory Visit | Attending: Radiation Oncology | Admitting: Radiation Oncology

## 2018-01-05 DIAGNOSIS — Z853 Personal history of malignant neoplasm of breast: Secondary | ICD-10-CM | POA: Diagnosis not present

## 2018-01-05 DIAGNOSIS — M1990 Unspecified osteoarthritis, unspecified site: Secondary | ICD-10-CM | POA: Diagnosis not present

## 2018-01-05 DIAGNOSIS — Z510 Encounter for antineoplastic radiation therapy: Secondary | ICD-10-CM | POA: Diagnosis not present

## 2018-01-05 DIAGNOSIS — C342 Malignant neoplasm of middle lobe, bronchus or lung: Secondary | ICD-10-CM | POA: Diagnosis not present

## 2018-01-05 DIAGNOSIS — D320 Benign neoplasm of cerebral meninges: Secondary | ICD-10-CM | POA: Diagnosis not present

## 2018-01-05 DIAGNOSIS — Z9889 Other specified postprocedural states: Secondary | ICD-10-CM | POA: Diagnosis not present

## 2018-01-05 DIAGNOSIS — C3432 Malignant neoplasm of lower lobe, left bronchus or lung: Secondary | ICD-10-CM | POA: Diagnosis not present

## 2018-01-08 ENCOUNTER — Ambulatory Visit
Admission: RE | Admit: 2018-01-08 | Discharge: 2018-01-08 | Disposition: A | Discharge status: Home / Self Care | Payer: Medicare Other | Source: Ambulatory Visit | Attending: Radiation Oncology | Admitting: Radiation Oncology

## 2018-01-08 ENCOUNTER — Telehealth: Payer: Self-pay | Admitting: Radiation Oncology

## 2018-01-08 DIAGNOSIS — Z510 Encounter for antineoplastic radiation therapy: Secondary | ICD-10-CM | POA: Diagnosis not present

## 2018-01-08 DIAGNOSIS — Z853 Personal history of malignant neoplasm of breast: Secondary | ICD-10-CM | POA: Diagnosis not present

## 2018-01-08 DIAGNOSIS — M1990 Unspecified osteoarthritis, unspecified site: Secondary | ICD-10-CM | POA: Diagnosis not present

## 2018-01-08 DIAGNOSIS — C342 Malignant neoplasm of middle lobe, bronchus or lung: Secondary | ICD-10-CM | POA: Diagnosis not present

## 2018-01-08 DIAGNOSIS — C3432 Malignant neoplasm of lower lobe, left bronchus or lung: Secondary | ICD-10-CM | POA: Diagnosis not present

## 2018-01-08 DIAGNOSIS — Z9889 Other specified postprocedural states: Secondary | ICD-10-CM | POA: Diagnosis not present

## 2018-01-08 DIAGNOSIS — D320 Benign neoplasm of cerebral meninges: Secondary | ICD-10-CM | POA: Diagnosis not present

## 2018-01-08 NOTE — Telephone Encounter (Signed)
Per Dr. Johny Shears request faxed letter to Dr. Fuller Plan requesting he consider delaying patient's colonoscopy. Fax confirmation of delivery obtained.

## 2018-01-08 NOTE — Telephone Encounter (Signed)
Left message for patient to call back  

## 2018-01-08 NOTE — Telephone Encounter (Signed)
PET scan showed an area of hypermetabolism in the right colon although no mass lesion identified so I'm not sure we want to wait 6 months to evaluate. However if the patient understands there could be a significant lesion in her colon and she wants to wait that is her choice.

## 2018-01-08 NOTE — Telephone Encounter (Signed)
Dr. Tammi Klippel has requested that the patient delay colonoscopy for 6 months unless she is symptomatic.  I spoke with her and she is not having any problems. I cancelled upcoming colonoscopy and placed a recall for 6 months. See note below.   January 08, 2018   Patient: [name] French Date of Birth: [birth year] Date of visit: 01/08/2018  Dr. Lucio Edward:  Ms. Santini is a patient under my care at [facility] She is actively receiving radiation therapy out of medical necessity. I am requesting you consider delaying her colonoscopy for six months which would allow her time to recover from the effects of radiation therapy. Certainly, if she is symptomatic I support moving forward as soon as possible.   Thank you for your consideration in this matter. If you have any further questions feel free to contact my office at [phone].    _____________________________________

## 2018-01-08 NOTE — Telephone Encounter (Signed)
Patient notified of recommendations. She agrees to proceed.  Colonoscopy rescheduled for 01/26/18 8:00. She verbalized understanding of instruction changes.

## 2018-01-08 NOTE — Telephone Encounter (Signed)
If she feels up to it and we can schedule it so it will not interrupt her radiation therapy we should proceed.

## 2018-01-08 NOTE — Telephone Encounter (Signed)
She doesn't want to wait.  Dr. Tammi Klippel is requesting colon be postponed unless she is symptomatic.  She will go with whatever you recommend.

## 2018-01-09 ENCOUNTER — Ambulatory Visit
Admission: RE | Admit: 2018-01-09 | Discharge: 2018-01-09 | Disposition: A | Discharge status: Home / Self Care | Payer: Medicare Other | Source: Ambulatory Visit | Attending: Radiation Oncology | Admitting: Radiation Oncology

## 2018-01-09 DIAGNOSIS — C342 Malignant neoplasm of middle lobe, bronchus or lung: Secondary | ICD-10-CM | POA: Diagnosis not present

## 2018-01-09 DIAGNOSIS — C3432 Malignant neoplasm of lower lobe, left bronchus or lung: Secondary | ICD-10-CM | POA: Diagnosis not present

## 2018-01-09 DIAGNOSIS — D320 Benign neoplasm of cerebral meninges: Secondary | ICD-10-CM | POA: Diagnosis not present

## 2018-01-09 DIAGNOSIS — Z9889 Other specified postprocedural states: Secondary | ICD-10-CM | POA: Diagnosis not present

## 2018-01-09 DIAGNOSIS — M1990 Unspecified osteoarthritis, unspecified site: Secondary | ICD-10-CM | POA: Diagnosis not present

## 2018-01-09 DIAGNOSIS — Z510 Encounter for antineoplastic radiation therapy: Secondary | ICD-10-CM | POA: Diagnosis not present

## 2018-01-09 DIAGNOSIS — Z853 Personal history of malignant neoplasm of breast: Secondary | ICD-10-CM | POA: Diagnosis not present

## 2018-01-10 ENCOUNTER — Ambulatory Visit
Admission: RE | Admit: 2018-01-10 | Discharge: 2018-01-10 | Disposition: A | Discharge status: Home / Self Care | Payer: Medicare Other | Source: Ambulatory Visit | Attending: Radiation Oncology | Admitting: Radiation Oncology

## 2018-01-10 DIAGNOSIS — D320 Benign neoplasm of cerebral meninges: Secondary | ICD-10-CM | POA: Diagnosis not present

## 2018-01-10 DIAGNOSIS — Z9889 Other specified postprocedural states: Secondary | ICD-10-CM | POA: Diagnosis not present

## 2018-01-10 DIAGNOSIS — Z510 Encounter for antineoplastic radiation therapy: Secondary | ICD-10-CM | POA: Diagnosis not present

## 2018-01-10 DIAGNOSIS — Z853 Personal history of malignant neoplasm of breast: Secondary | ICD-10-CM | POA: Diagnosis not present

## 2018-01-10 DIAGNOSIS — C3432 Malignant neoplasm of lower lobe, left bronchus or lung: Secondary | ICD-10-CM | POA: Diagnosis not present

## 2018-01-10 DIAGNOSIS — M1990 Unspecified osteoarthritis, unspecified site: Secondary | ICD-10-CM | POA: Diagnosis not present

## 2018-01-10 DIAGNOSIS — C342 Malignant neoplasm of middle lobe, bronchus or lung: Secondary | ICD-10-CM | POA: Diagnosis not present

## 2018-01-11 ENCOUNTER — Telehealth (INDEPENDENT_AMBULATORY_CARE_PROVIDER_SITE_OTHER): Payer: Self-pay | Admitting: Physical Medicine and Rehabilitation

## 2018-01-11 ENCOUNTER — Ambulatory Visit
Admission: RE | Admit: 2018-01-11 | Discharge: 2018-01-11 | Disposition: A | Discharge status: Home / Self Care | Payer: Medicare Other | Source: Ambulatory Visit | Attending: Radiation Oncology | Admitting: Radiation Oncology

## 2018-01-11 DIAGNOSIS — Z853 Personal history of malignant neoplasm of breast: Secondary | ICD-10-CM | POA: Diagnosis not present

## 2018-01-11 DIAGNOSIS — Z9889 Other specified postprocedural states: Secondary | ICD-10-CM | POA: Diagnosis not present

## 2018-01-11 DIAGNOSIS — C342 Malignant neoplasm of middle lobe, bronchus or lung: Secondary | ICD-10-CM | POA: Diagnosis not present

## 2018-01-11 DIAGNOSIS — D320 Benign neoplasm of cerebral meninges: Secondary | ICD-10-CM | POA: Diagnosis not present

## 2018-01-11 DIAGNOSIS — M1990 Unspecified osteoarthritis, unspecified site: Secondary | ICD-10-CM | POA: Diagnosis not present

## 2018-01-11 DIAGNOSIS — Z510 Encounter for antineoplastic radiation therapy: Secondary | ICD-10-CM | POA: Diagnosis not present

## 2018-01-11 DIAGNOSIS — C3432 Malignant neoplasm of lower lobe, left bronchus or lung: Secondary | ICD-10-CM | POA: Diagnosis not present

## 2018-01-11 NOTE — Telephone Encounter (Signed)
Phone number is not working. 

## 2018-01-11 NOTE — Telephone Encounter (Signed)
ok 

## 2018-01-11 NOTE — Telephone Encounter (Signed)
Scheduled for 01/24/18 at 1445.

## 2018-01-12 ENCOUNTER — Ambulatory Visit
Admission: RE | Admit: 2018-01-12 | Discharge: 2018-01-12 | Disposition: A | Discharge status: Home / Self Care | Payer: Medicare Other | Source: Ambulatory Visit | Attending: Radiation Oncology | Admitting: Radiation Oncology

## 2018-01-12 DIAGNOSIS — Z9889 Other specified postprocedural states: Secondary | ICD-10-CM | POA: Diagnosis not present

## 2018-01-12 DIAGNOSIS — M1990 Unspecified osteoarthritis, unspecified site: Secondary | ICD-10-CM | POA: Diagnosis not present

## 2018-01-12 DIAGNOSIS — Z510 Encounter for antineoplastic radiation therapy: Secondary | ICD-10-CM | POA: Diagnosis not present

## 2018-01-12 DIAGNOSIS — D320 Benign neoplasm of cerebral meninges: Secondary | ICD-10-CM | POA: Diagnosis not present

## 2018-01-12 DIAGNOSIS — C342 Malignant neoplasm of middle lobe, bronchus or lung: Secondary | ICD-10-CM | POA: Diagnosis not present

## 2018-01-12 DIAGNOSIS — Z853 Personal history of malignant neoplasm of breast: Secondary | ICD-10-CM | POA: Diagnosis not present

## 2018-01-12 DIAGNOSIS — C3432 Malignant neoplasm of lower lobe, left bronchus or lung: Secondary | ICD-10-CM | POA: Diagnosis not present

## 2018-01-15 ENCOUNTER — Ambulatory Visit: Payer: Medicare Other

## 2018-01-15 ENCOUNTER — Ambulatory Visit
Admission: RE | Admit: 2018-01-15 | Discharge: 2018-01-15 | Disposition: A | Discharge status: Home / Self Care | Payer: Medicare Other | Source: Ambulatory Visit | Attending: Radiation Oncology | Admitting: Radiation Oncology

## 2018-01-15 DIAGNOSIS — Z9889 Other specified postprocedural states: Secondary | ICD-10-CM | POA: Diagnosis not present

## 2018-01-15 DIAGNOSIS — Z853 Personal history of malignant neoplasm of breast: Secondary | ICD-10-CM | POA: Diagnosis not present

## 2018-01-15 DIAGNOSIS — D320 Benign neoplasm of cerebral meninges: Secondary | ICD-10-CM | POA: Diagnosis not present

## 2018-01-15 DIAGNOSIS — C342 Malignant neoplasm of middle lobe, bronchus or lung: Secondary | ICD-10-CM | POA: Diagnosis not present

## 2018-01-15 DIAGNOSIS — C3432 Malignant neoplasm of lower lobe, left bronchus or lung: Secondary | ICD-10-CM | POA: Diagnosis not present

## 2018-01-15 DIAGNOSIS — Z510 Encounter for antineoplastic radiation therapy: Secondary | ICD-10-CM | POA: Diagnosis not present

## 2018-01-15 DIAGNOSIS — M1990 Unspecified osteoarthritis, unspecified site: Secondary | ICD-10-CM | POA: Diagnosis not present

## 2018-01-16 ENCOUNTER — Ambulatory Visit: Payer: Medicare Other

## 2018-01-16 ENCOUNTER — Ambulatory Visit
Admission: RE | Admit: 2018-01-16 | Discharge: 2018-01-16 | Disposition: A | Discharge status: Home / Self Care | Payer: Medicare Other | Source: Ambulatory Visit | Attending: Radiation Oncology | Admitting: Radiation Oncology

## 2018-01-16 DIAGNOSIS — C342 Malignant neoplasm of middle lobe, bronchus or lung: Secondary | ICD-10-CM | POA: Diagnosis not present

## 2018-01-16 DIAGNOSIS — Z510 Encounter for antineoplastic radiation therapy: Secondary | ICD-10-CM | POA: Diagnosis not present

## 2018-01-16 DIAGNOSIS — Z9889 Other specified postprocedural states: Secondary | ICD-10-CM | POA: Diagnosis not present

## 2018-01-16 DIAGNOSIS — M1990 Unspecified osteoarthritis, unspecified site: Secondary | ICD-10-CM | POA: Diagnosis not present

## 2018-01-16 DIAGNOSIS — C3432 Malignant neoplasm of lower lobe, left bronchus or lung: Secondary | ICD-10-CM | POA: Diagnosis not present

## 2018-01-16 DIAGNOSIS — D320 Benign neoplasm of cerebral meninges: Secondary | ICD-10-CM | POA: Diagnosis not present

## 2018-01-16 DIAGNOSIS — Z853 Personal history of malignant neoplasm of breast: Secondary | ICD-10-CM | POA: Diagnosis not present

## 2018-01-17 ENCOUNTER — Ambulatory Visit: Payer: Medicare Other

## 2018-01-17 ENCOUNTER — Ambulatory Visit
Admission: RE | Admit: 2018-01-17 | Discharge: 2018-01-17 | Disposition: A | Discharge status: Home / Self Care | Payer: Medicare Other | Source: Ambulatory Visit | Attending: Radiation Oncology | Admitting: Radiation Oncology

## 2018-01-17 DIAGNOSIS — C3432 Malignant neoplasm of lower lobe, left bronchus or lung: Secondary | ICD-10-CM | POA: Diagnosis not present

## 2018-01-17 DIAGNOSIS — C342 Malignant neoplasm of middle lobe, bronchus or lung: Secondary | ICD-10-CM | POA: Diagnosis not present

## 2018-01-17 DIAGNOSIS — Z510 Encounter for antineoplastic radiation therapy: Secondary | ICD-10-CM | POA: Diagnosis not present

## 2018-01-17 DIAGNOSIS — M1990 Unspecified osteoarthritis, unspecified site: Secondary | ICD-10-CM | POA: Diagnosis not present

## 2018-01-17 DIAGNOSIS — Z853 Personal history of malignant neoplasm of breast: Secondary | ICD-10-CM | POA: Diagnosis not present

## 2018-01-17 DIAGNOSIS — Z9889 Other specified postprocedural states: Secondary | ICD-10-CM | POA: Diagnosis not present

## 2018-01-17 DIAGNOSIS — D320 Benign neoplasm of cerebral meninges: Secondary | ICD-10-CM | POA: Diagnosis not present

## 2018-01-18 ENCOUNTER — Ambulatory Visit
Admission: RE | Admit: 2018-01-18 | Discharge: 2018-01-18 | Disposition: A | Discharge status: Home / Self Care | Payer: Medicare Other | Source: Ambulatory Visit | Attending: Radiation Oncology | Admitting: Radiation Oncology

## 2018-01-18 ENCOUNTER — Encounter: Payer: Self-pay | Admitting: Radiation Oncology

## 2018-01-18 ENCOUNTER — Encounter: Payer: Medicare Other | Admitting: Gastroenterology

## 2018-01-18 DIAGNOSIS — Z853 Personal history of malignant neoplasm of breast: Secondary | ICD-10-CM | POA: Diagnosis not present

## 2018-01-18 DIAGNOSIS — C3432 Malignant neoplasm of lower lobe, left bronchus or lung: Secondary | ICD-10-CM | POA: Diagnosis not present

## 2018-01-18 DIAGNOSIS — M1990 Unspecified osteoarthritis, unspecified site: Secondary | ICD-10-CM | POA: Diagnosis not present

## 2018-01-18 DIAGNOSIS — C342 Malignant neoplasm of middle lobe, bronchus or lung: Secondary | ICD-10-CM | POA: Diagnosis not present

## 2018-01-18 DIAGNOSIS — D320 Benign neoplasm of cerebral meninges: Secondary | ICD-10-CM | POA: Diagnosis not present

## 2018-01-18 DIAGNOSIS — Z510 Encounter for antineoplastic radiation therapy: Secondary | ICD-10-CM | POA: Diagnosis not present

## 2018-01-18 DIAGNOSIS — Z9889 Other specified postprocedural states: Secondary | ICD-10-CM | POA: Diagnosis not present

## 2018-01-18 NOTE — Progress Notes (Signed)
01/18/2018 1130. Received patient, her husband and her daughter in exam room following final radiation treatment. Provided patient with a one month follow up appointment card. Provided patient with another tube of sonafine cream and encouraged her to continue use bid for at least the next two weeks. Also, encouraged patient to continue use of carafate and zantac as directed. Patient verbalized understanding. Reviewed upcoming appointments with patient and her family. All verbalized understanding. Patient left ambulatory with her family headed out to lunch and in no distress.

## 2018-01-19 NOTE — Progress Notes (Signed)
  Radiation Oncology         [phone] ________________________________  Name: [name] French MRN: [id number]  Date: 01/18/2018  DOB: 1926-04-13  End of Treatment Note  Diagnosis:   84 y.o. women with newly diagnosed synchronous NSCLC, adenocarcinoma with a 6 cm LLL mass extending into the left hilum (stage IIB, T3N0M0) and a 2.4 cm RML mass (stage IB T2N0).     Indication for treatment:  Curative       Radiation treatment dates:   11/28/17 - 01/18/18  Site/dose:   66 Gy directed to the LLL and RML lung lesions in 33 fractions of 2 Gy each.  Beams/energy:   Photons // IMRT technique // 6X  Narrative: The patient tolerated radiation treatment relatively well. She denied having any pain but did report mild dysphagia, a dry cough and sore throat, but no hoarseness noted. She reported having SOB with great exertion only and was hospitalized on 12/25/17 under observation for tachycardia, chest pain and increased SOB.  Prior to completing treatment the chest pain was resolved. She did use Carefate, Sonafine, and Radaiplex as directed for symptom management. There was mild hyperpigmentation of the middle back without desquamation.  Plan: The patient has completed radiation treatment. The patient will return to radiation oncology clinic for routine followup in one month. I advised her to call or return sooner if she has any questions or concerns related to her recovery or treatment. ________________________________  Sheral Apley. Tammi Klippel, M.D.   This document serves as a record of services personally performed by Tyler Pita MD. It was created on his behalf by Delton Coombes, a trained medical scribe. The creation of this record is based on the scribe's personal observations and the provider's statements to them.

## 2018-01-23 DIAGNOSIS — Z961 Presence of intraocular lens: Secondary | ICD-10-CM | POA: Diagnosis not present

## 2018-01-23 DIAGNOSIS — H524 Presbyopia: Secondary | ICD-10-CM | POA: Diagnosis not present

## 2018-01-24 ENCOUNTER — Ambulatory Visit (INDEPENDENT_AMBULATORY_CARE_PROVIDER_SITE_OTHER): Payer: Self-pay

## 2018-01-24 ENCOUNTER — Encounter (INDEPENDENT_AMBULATORY_CARE_PROVIDER_SITE_OTHER): Payer: Self-pay | Admitting: Physical Medicine and Rehabilitation

## 2018-01-24 ENCOUNTER — Ambulatory Visit
Admission: RE | Admit: 2018-01-24 | Discharge: 2018-01-24 | Disposition: A | Discharge status: Home / Self Care | Payer: Medicare Other | Source: Ambulatory Visit | Attending: Radiation Oncology | Admitting: Radiation Oncology

## 2018-01-24 ENCOUNTER — Ambulatory Visit (INDEPENDENT_AMBULATORY_CARE_PROVIDER_SITE_OTHER): Payer: Medicare Other | Admitting: Physical Medicine and Rehabilitation

## 2018-01-24 DIAGNOSIS — D320 Benign neoplasm of cerebral meninges: Secondary | ICD-10-CM | POA: Diagnosis not present

## 2018-01-24 DIAGNOSIS — M25552 Pain in left hip: Secondary | ICD-10-CM | POA: Diagnosis not present

## 2018-01-24 DIAGNOSIS — D329 Benign neoplasm of meninges, unspecified: Secondary | ICD-10-CM

## 2018-01-24 MED ORDER — GADOBENATE DIMEGLUMINE 529 MG/ML IV SOLN
12.0000 mL | Freq: Once | INTRAVENOUS | Status: AC | PRN
Start: 2018-01-24 — End: 2018-01-24
  Administered 2018-01-24: 12 mL via INTRAVENOUS

## 2018-01-24 NOTE — Progress Notes (Signed)
[name] French - 84 y.o. female MRN [id number]  Date of birth: December 01, 1926  Office Visit Note: Visit Date: 01/24/2018 PCP: [name] Pao, MD Referred by: [name] Pao, MD  Subjective: Chief Complaint  Patient presents with  . Left Hip - Pain   HPI: [name] French is an 84 year old female that comes in today with worsening left hip and groin pain.  We saw her in August of last year and completed intra-articular injection of the left hip joint with good relief up until just a few weeks ago.  She has had no new trauma or other issues.  She does use some medication for mild relief.  She reports injection was very beneficial.  We will repeat this today.    ROS Otherwise per HPI.  Assessment & Plan: Visit Diagnoses:  1. Pain in left hip     Plan: Findings:  Diagnostic and hopefully therapeutic anesthetic hip arthrogram on the left.  Patient did have relief during the anesthetic phase of the injection.    Meds & Orders: No orders of the defined types were placed in this encounter.   Orders Placed This Encounter  Procedures  . Large Joint Inj: L hip joint  . XR C-ARM NO REPORT    Follow-up: Return if symptoms worsen or fail to improve.   Procedures: Large Joint Inj: L hip joint on 01/24/2018 3:21 PM Indications: pain and diagnostic evaluation Details: 22 G needle, anterior approach  Arthrogram: Yes  Medications: 80 mg triamcinolone acetonide 40 MG/ML; 3 mL bupivacaine 0.5 % Outcome: tolerated well, no immediate complications  Arthrogram demonstrated excellent flow of contrast throughout the joint surface without extravasation or obvious defect.  The patient had relief of symptoms during the anesthetic phase of the injection.  Procedure, treatment alternatives, risks and benefits explained, specific risks discussed. Consent was given by the patient. Immediately prior to procedure a time out was called to verify the correct patient, procedure, equipment, support staff and  site/side marked as required. Patient was prepped and draped in the usual sterile fashion.      No notes on file   Clinical History: No specialty comments available.  She reports that she quit smoking about 39 years ago. Her smoking use included cigarettes. She has a 12.50 pack-year smoking history. she has never used smokeless tobacco.  Recent Labs    12/25/17 0946  HGBA1C 5.6    Objective:  VS:  HT:    WT:   BMI:     BP:   HR: bpm  TEMP: ( )  RESP:  Physical Exam  Musculoskeletal:  Patient lacks range of motion of the left hip with pain with internal rotation.    Ortho Exam Imaging: No results found.  Past Medical/Family/Surgical/Social History: Medications & Allergies reviewed per EMR Patient Active Problem List   Diagnosis Date Noted  . Chest pain 12/25/2017  . Abnormal findings on radiological examination of gastrointestinal tract 11/16/2017  . Primary cancer of left lower lobe of lung ([place]) 11/15/2017  . Non-small cell cancer of right lung ([place]) 11/07/2017  . Lung mass 10/17/2017  . Breast cancer of upper-inner quadrant of right female breast ([place]) 06/10/2013  . Mass of breast, left 05/31/2013  . PERSONAL HISTORY OF COLONIC POLYPS 07/26/2010   Past Medical History:  Diagnosis Date  . Allergic rhinitis   . Anemia    during pregnancy  . Arthritis   . Breast cancer ([place]) 05/08/13   right upper inner, invasive mammary  . Breast  cancer, right breast ([place]) 04/16/2013   Underwent lumpectomy on 11/06/13. Path showed G1 ILC, 2.7 cm, neg margins, receptor+, Her2neg   . Diverticulosis   . 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 Hemorrhoids   . Hx of adenomatous colonic polyps 05/2002  . Lung cancer ([place])   . Lung cancer ([place]) 10/2017  . Meningioma ([place])   . Osteoporosis   . Pneumonia    walking pneumonia  . Rosacea   . 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 Use of letrozole ([place])    neoadjuvant antiestrogen therapy with letrozole 2.5 mg daily x 7 monhts    Family History  Problem Relation Age of Onset  . Heart disease Mother   . Prostate cancer Father   . Lung cancer Sister    Past Surgical History:  Procedure Laterality Date  . BREAST BIOPSY Left 1960   lt br bx/benign  . BREAST EXCISIONAL BIOPSY Left   . BREAST LUMPECTOMY Right   . BREAST LUMPECTOMY WITH NEEDLE LOCALIZATION Bilateral 11/06/2013   Procedure: BREAST LUMPECTOMY WITH NEEDLE LOCALIZATION;  Surgeon: [name] Lasso, MD;  Location: [place];  Service: General;  Laterality: Bilateral;  . COLONOSCOPY    . EYE SURGERY     both cataracts  . MOHS SURGERY Right    nose basal/squamous  . VIDEO BRONCHOSCOPY WITH ENDOBRONCHIAL NAVIGATION N/A 11/01/2017   Procedure: VIDEO BRONCHOSCOPY WITH ENDOBRONCHIAL NAVIGATION;  Surgeon: [name] Nakayama, MD;  Location: [place];  Service: Thoracic;  Laterality: N/A;  . WRIST SURGERY  1990   lt   Social History   Occupational History  . Occupation: Retired  Tobacco Use  . Smoking status: Former Smoker    Packs/day: 0.50    Years: 25.00    Pack years: 12.50    Types: Cigarettes    Last attempt to quit: 12/19/1978    Years since quitting: 39.1  . Smokeless tobacco: Never Used  Substance and Sexual Activity  . Alcohol use: Yes    Alcohol/week: 2.4 oz    Types: 4 Glasses of wine per week  . Drug use: No  . Sexual activity: Not Currently    Comment: menarche at age14, menopause in 76, HRT x 65, age at first live birth 43's, G78 P3

## 2018-01-24 NOTE — Progress Notes (Deleted)
Pt states pain in left hip. Pt states pain has started back a few weeks. Pt states pain is just there, medication brings some relief. -Dye Allergies.

## 2018-01-24 NOTE — Patient Instructions (Signed)

## 2018-01-25 ENCOUNTER — Telehealth: Payer: Self-pay | Admitting: Urology

## 2018-01-25 ENCOUNTER — Encounter: Payer: Self-pay | Admitting: Urology

## 2018-01-25 NOTE — Progress Notes (Signed)
Patient returned my call and we reviewed results from her recent brain MRI which revealed stability of the meningioma.  Advised that we will continue to monitor this with serial MRI scans going forward.  She is in the midst of a colon prep in preparation for colonoscopy tomorrow.  She reports that she is doing very well having recently completed chest radiation and is currently without complaints.  She has a scheduled follow-up on February 20, 2018 which she intends to keep.  She appears to have a good understanding of her disease and recommendations and is comfortable with this plan.  I advised her to call with any questions or concerns in the interim.   Nicholos Johns, PA-C

## 2018-01-25 NOTE — Telephone Encounter (Signed)
LMOM for patient to return my call regarding her recent follow up brain MRI which shows stability of the meningioma that was found incidentally during her staging workup.  We will continue to monitor this with a follow up brain MRI in 3 months but will see her back to follow up s/p completion of chest/lung radiation which she completed on 01/18/18. She has a scheduled f/u on 02/20/18.   Nicholos Johns, PA-C

## 2018-01-26 ENCOUNTER — Telehealth: Payer: Self-pay

## 2018-01-26 ENCOUNTER — Other Ambulatory Visit: Payer: Self-pay

## 2018-01-26 ENCOUNTER — Ambulatory Visit: Payer: Medicare Other | Admitting: Gastroenterology

## 2018-01-26 ENCOUNTER — Encounter: Payer: Self-pay | Admitting: Gastroenterology

## 2018-01-26 ENCOUNTER — Encounter: Payer: Medicare Other | Admitting: Gastroenterology

## 2018-01-26 VITALS — BP 159/76 | HR 126 | Temp 98.0°F | Resp 25 | Ht 65.0 in | Wt 134.0 lb

## 2018-01-26 DIAGNOSIS — Z6822 Body mass index (BMI) 22.0-22.9, adult: Secondary | ICD-10-CM | POA: Diagnosis not present

## 2018-01-26 DIAGNOSIS — R948 Abnormal results of function studies of other organs and systems: Secondary | ICD-10-CM

## 2018-01-26 DIAGNOSIS — C3490 Malignant neoplasm of unspecified part of unspecified bronchus or lung: Secondary | ICD-10-CM | POA: Diagnosis not present

## 2018-01-26 DIAGNOSIS — R000 Tachycardia, unspecified: Secondary | ICD-10-CM | POA: Diagnosis not present

## 2018-01-26 DIAGNOSIS — C7989 Secondary malignant neoplasm of other specified sites: Secondary | ICD-10-CM | POA: Diagnosis not present

## 2018-01-26 MED ORDER — SODIUM CHLORIDE 0.9 % IV SOLN: 500.0000 mL | Freq: Once | INTRAVENOUS | Status: DC

## 2018-01-26 NOTE — Progress Notes (Signed)
I have reviewed the patient's medical history in detail and updated the computerized patient record.

## 2018-01-26 NOTE — Telephone Encounter (Signed)
-----   Message from Ladene Artist, MD sent at 01/26/2018  8:21 AM EST ----- Needs a Cardiology appt for tachycardia: HR=150.

## 2018-01-26 NOTE — Progress Notes (Signed)
Late entry- Appointment made with Dr. Osborne Casco to for 1600 today.  Pt is aware of the time and asked if he would prescribe her a Beta Blocker- she had been "on them since her ED visit and had stopped cold [place] two days ago."  Dr. Osborne Casco said, via his nurse Randell Patient, he would address this at her visit today. She also has a cardiologist appt on 01-29-18 at 10:00 with Dr. Peter Martinique.- pt aware of this. She is not c/o any chest pain, SOB, weakness or dizziness at DC.  Told to go to ED if she starts having any of these symptoms and understanding voiced.  Dr. Fuller Plan made aware of pt's appt with Dr. Osborne Casco and Dr. Martinique. D/C'd via wheel chair at 957

## 2018-01-26 NOTE — Progress Notes (Signed)
Patient's procedure cancelled today related to elevated heart rate. IV rate at flush rate. Clear liquid apple juice given. [patient denies chest pain, shortness of breath or any cardiac or respiratory symptoms.

## 2018-01-26 NOTE — Telephone Encounter (Signed)
Pt scheduled to see Dr. Peter Martinique 01/29/18@10am , pt to arrive there at 9:40am. Appt location is at [place]. [place] RN to notify pt of appt.

## 2018-01-27 NOTE — Progress Notes (Signed)
Cardiology Office Note   Date:  01/29/2018   ID:  [name] French, DOB [birth year], MRN [id number]  PCP:  [name] Pao, MD  Cardiologist:   [name] Martinique, MD   Chief Complaint  Patient presents with  . Tachycardia      History of Present Illness: [name] French is a 84 y.o. female who is seen at the request of Dr. Osborne French for evaluation of tachycardia. She has no known history of cardiac disease. She has a history of Lung CA receiving RT. Not felt to be a candidate for chemo or surgery. She was admitted overnight 12/25/17 for evaluation of atypical chest pain. CT negative for PE. There was a left pleural effusion. troponins and Ecg were unremarkable. Echo was normal. She was noted to have PACs and mention of a run of NSVT. She was started on metoprolol. Her chest pain was felt to be esophageal.  She was seen by GI 01/25/18 for evaluation of a abnormal colon finding on PET scan. It was noted that her HR was elevated to ? 150. She had stopped taking metoprolol 2 days prior. No Ecg recorded.   On follow up today she has been started back on metoprolol at 12.5 mg bid. She is tolerating this well. She was completely unaware of heart racing before. No dizziness, chest pain, palpitations, dyspnea.     Past Medical History:  Diagnosis Date  . Allergic rhinitis   . Anemia    during pregnancy  . Arthritis   . Breast cancer ([facility]) 05/08/13   right upper inner, invasive mammary  . Breast cancer, right breast ([facility]) 04/16/2013   Underwent lumpectomy on 11/06/13. Path showed G1 ILC, 2.7 cm, neg margins, receptor+, Her2neg   . Diverticulosis   . 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 Hemorrhoids   . Hx of adenomatous colonic polyps 05/2002  . Lung cancer ([facility])   . Lung cancer ([facility]) 10/2017  . Meningioma ([facility])   . Osteoporosis   . Pneumonia    walking pneumonia  . Rosacea   . 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 Use of letrozole ([facility])    neoadjuvant antiestrogen therapy with  letrozole 2.5 mg daily x 7 monhts    Past Surgical History:  Procedure Laterality Date  . BREAST BIOPSY Left 1960   lt br bx/benign  . BREAST EXCISIONAL BIOPSY Left   . BREAST LUMPECTOMY Right   . BREAST LUMPECTOMY WITH NEEDLE LOCALIZATION Bilateral 11/06/2013   Procedure: BREAST LUMPECTOMY WITH NEEDLE LOCALIZATION;  Surgeon: [name] Lasso, MD;  Location: [facility];  Service: General;  Laterality: Bilateral;  . COLONOSCOPY    . EYE SURGERY     both cataracts  . MOHS SURGERY Right    nose basal/squamous  . VIDEO BRONCHOSCOPY WITH ENDOBRONCHIAL NAVIGATION N/A 11/01/2017   Procedure: VIDEO BRONCHOSCOPY WITH ENDOBRONCHIAL NAVIGATION;  Surgeon: [name], MD;  Location: [facility];  Service: Thoracic;  Laterality: N/A;  . WRIST SURGERY  1990   lt     Current Outpatient Medications  Medication Sig Dispense Refill  . aspirin EC 81 MG tablet Take 81 mg daily by mouth.    . cetirizine (ZYRTEC) 10 MG tablet Take 10 mg daily by mouth.     . Cholecalciferol (VITAMIN D-3) 1000 UNITS CAPS Take 1,000 Units daily by mouth.     . doxylamine, Sleep, (UNISOM) 25 MG tablet Take 25 mg at bedtime by mouth.    Marland Kitchen  GNP GLUCOSAMINE SULFATE PO Take 1 tablet daily by mouth.    Marland Kitchen ibuprofen (ADVIL,MOTRIN) 200 MG tablet Take 200 mg by mouth 2 (two) times daily as needed (PAIN).    Marland Kitchen Menthol, Topical Analgesic, (BIOFREEZE EX) Apply 1-2 application 3 (three) times daily as needed topically (for pain/muscle aches.).    Marland Kitchen naphazoline-pheniramine (NAPHCON-A) 0.025-0.3 % ophthalmic solution Place 1-2 drops 4 (four) times daily as needed into both eyes for eye irritation or allergies.    . naproxen sodium (ALEVE) 220 MG tablet Take 220 mg by mouth 2 (two) times daily as needed (ARTHRITIS PAIN).    Marland Kitchen Olopatadine HCl (PAZEO) 0.7 % SOLN Place 1-2 drops 3 (three) times daily as needed into both eyes (for allergy eyes).    . pravastatin (PRAVACHOL) 40 MG tablet Take 40 mg daily by mouth.     .  Probiotic Product (ALIGN) 4 MG CAPS Take 4 mg daily by mouth.    . ranitidine (ZANTAC 75) 75 MG tablet Take 1 tablet (75 mg total) by mouth 2 (two) times daily. 60 tablet 5  . SUPER B COMPLEX & C TABS Take 1 tablet by mouth daily.  0  . zolpidem (AMBIEN) 5 MG tablet Take 5 mg at bedtime as needed by mouth (for sleep.).   2  . metoprolol tartrate (LOPRESSOR) 25 MG tablet Take 1 tablet (25 mg total) by mouth 2 (two) times daily. 60 tablet 0   Current Facility-Administered Medications  Medication Dose Route Frequency Provider Last Rate Last Dose  . 0.9 %  sodium chloride infusion  500 mL Intravenous Once Ladene Artist, MD        Allergies:   Codeine; Hydrocodone; and Neosporin [neomycin-bacitracin zn-polymyx]    Social History:  The patient  reports that she quit smoking about 39 years ago. Her smoking use included cigarettes. She has a 12.50 pack-year smoking history. she has never used smokeless tobacco. She reports that she drinks about 2.4 oz of alcohol per week. She reports that she does not use drugs.   Family History:  The patient's family history includes Heart disease in her mother; Lung cancer in her sister; Prostate cancer in her father.    ROS:  Please see the history of present illness.   Otherwise, review of systems are positive for none.   All other systems are reviewed and negative.    PHYSICAL EXAM: VS:  BP (!) 142/80 (BP Location: Right Arm)   Pulse (!) 104   Ht 5\' 5"  (1.651 m)   Wt 126 lb 12.8 oz (57.5 kg)   BMI 21.10 kg/m  , BMI Body mass index is 21.1 kg/m. GEN: Well nourished, well developed, in no acute distress  HEENT: normal  Neck: no JVD, carotid bruits, or masses Cardiac: RRR; no murmurs, rubs, or gallops,no edema  Respiratory:  clear to auscultation bilaterally, normal work of breathing GI: soft, nontender, nondistended, + BS MS: no deformity or atrophy  Skin: warm and dry, no rash Neuro:  Strength and sensation are intact Psych: euthymic mood, full  affect   EKG:  EKG is ordered today. The ekg ordered today demonstrates sinus tachycardia rate 104. Otherwise normal. I have personally reviewed and interpreted this study.    Recent Labs: 11/01/2017: ALT 12 12/25/2017: Hemoglobin 13.5; Platelets 205 12/26/2017: BUN 12; Creatinine, Ser 0.85; Potassium 3.6; Sodium 137    Lipid Panel    Component Value Date/Time   CHOL 158 12/26/2017 0025   TRIG 167 (H) 12/26/2017 0025  HDL 51 12/26/2017 0025   CHOLHDL 3.1 12/26/2017 0025   VLDL 33 12/26/2017 0025   LDLCALC 74 12/26/2017 0025      Wt Readings from Last 3 Encounters:  01/29/18 126 lb 12.8 oz (57.5 kg)  01/26/18 134 lb (60.8 kg)  12/26/17 134 lb (60.8 kg)      Other studies Reviewed: Additional studies/ records that were reviewed today include:   Echo: 12/26/17:Study Conclusions  - Left ventricle: The cavity size was normal. Systolic function was   normal. The estimated ejection fraction was in the range of 60%   to 65%. Wall motion was normal; there were no regional wall   motion abnormalities. Left ventricular diastolic function   parameters were normal. - Aortic valve: There was mild regurgitation. - Atrial septum: No defect or patent foramen ovale was identified.   CT ANGIOGRAPHY CHEST WITH CONTRAST  TECHNIQUE: Multidetector CT imaging of the chest was performed using the standard protocol during bolus administration of intravenous contrast. Multiplanar CT image reconstructions and MIPs were obtained to evaluate the vascular anatomy.  CONTRAST:  82mL ISOVUE-370 IOPAMIDOL (ISOVUE-370) INJECTION 76%, 17mL ISOVUE-370 IOPAMIDOL (ISOVUE-370) INJECTION 76%  COMPARISON:  Chest CT 10/11/2017 and PET-CT 10/20/2017  FINDINGS: Cardiovascular: The heart is normal in size. No pericardial effusion. Stable aortic and coronary artery calcifications.  The pulmonary arterial tree is suboptimally opacified but no obvious large or central filling defects to suggest  pulmonary embolism.  Mediastinum/Nodes: Stable small scattered mediastinal and hilar lymph nodes. Prevascular lymph node on image number 33 measures 6 mm hand precarinal lymph node on image number 33 measures 7 mm. The esophagus is grossly normal.  Lungs/Pleura: Lesion left infrahilar mass is smaller. It measures approximately 3.4 x 2.9 cm on image number 45. The previously measured 5.4 x 4.8 cm. Persistence endobronchial soft tissue density and mass effect on the lingula and lower lobe bronchi. Associated left lower lobe atelectasis and a persistent small to moderate size left pleural effusion.  14.5 x 10 mm right middle lobe nodular ground-glass opacity is new since the prior study and this most likely inflammatory. No worrisome pulmonary nodules to suggest pulmonary metastatic disease.  Upper Abdomen: No significant upper abdominal findings but very little of the upper abdomen is image.  Musculoskeletal: No breast masses, supraclavicular or axillary adenopathy. The thyroid gland appears normal.  The bony thorax is intact. No worrisome bone lesions or acute bony findings.  Review of the MIP images confirms the above findings.  IMPRESSION: 1. No CT findings for pulmonary embolism but suboptimal opacification of the pulmonary arterial tree. 2. Interval decrease in size of the left hilar/infrahilar lung mass. 3. Persistent left lower lobe atelectasis and small to moderate size left pleural effusion. 4. New nodular air ground-glass opacity in the right middle lobe, likely inflammatory but attention on future scans is suggested. 5. Stable small mediastinal and hilar lymph nodes.  Aortic Atherosclerosis (ICD10-I70.0) and Emphysema (ICD10-J43.9).   Electronically Signed   By: Marijo Sanes M.D.   On: 12/25/2017 12:56    ASSESSMENT AND PLAN:  1. Sinus tachycardia. Patient had some rebound tachycardia after stopping metoprolol abruptly. HR still a little high.  Otherwise no concerns from a cardiac standpoint. Echo was OK. Will increase metoprolol to 25 mg bid. No further work up needed. She is OK to proceed with GI evaluation. 2. Lung CA s/p RT 3. HTN    Current medicines are reviewed at length with the patient today.  The patient does not have concerns regarding  medicines.  The following changes have been made:  Increase metoprolol to 25 mg bid  Labs/ tests ordered today include: none  Orders Placed This Encounter  Procedures  . EKG 12-Lead     Disposition:   FU with PRN  Signed, [name] Martinique, MD  01/29/2018 10:31 AM    [facility] [address], [place], [place], [address] Phone [phone], Fax [phone]

## 2018-01-29 ENCOUNTER — Ambulatory Visit (INDEPENDENT_AMBULATORY_CARE_PROVIDER_SITE_OTHER): Payer: Medicare Other | Admitting: Cardiology

## 2018-01-29 ENCOUNTER — Encounter: Payer: Self-pay | Admitting: Cardiology

## 2018-01-29 ENCOUNTER — Telehealth: Payer: Self-pay

## 2018-01-29 VITALS — BP 142/80 | HR 104 | Ht 65.0 in | Wt 126.8 lb

## 2018-01-29 DIAGNOSIS — R000 Tachycardia, unspecified: Secondary | ICD-10-CM | POA: Diagnosis not present

## 2018-01-29 MED ORDER — METOPROLOL TARTRATE 25 MG PO TABS: 25.0000 mg | ORAL_TABLET | Freq: Two times a day (BID) | ORAL | 0 refills | Status: DC

## 2018-01-29 NOTE — Telephone Encounter (Signed)
-----   Message from Ladene Artist, MD sent at 01/29/2018 11:50 AM EST ----- Please rechedule her colonoscopy. She needs to remain on metoprolol for prep and day of procedure.  ----- Message ----- From: Martinique, Peter M, MD Sent: 01/29/2018  10:38 AM To: Ladene Artist, MD

## 2018-01-29 NOTE — Patient Instructions (Signed)
Increase metoprolol to 25 mg twice a day  I will see you as needed.

## 2018-01-29 NOTE — Telephone Encounter (Signed)
Rescheduled Colonoscopy for 02/16/18 and nurse visit on 02/14/18. Informed patient to stay on metoprolol prep day of procedure and the day of the procedure. Patient verbalized understanding.

## 2018-02-06 ENCOUNTER — Encounter (HOSPITAL_COMMUNITY): Payer: Self-pay

## 2018-02-06 ENCOUNTER — Emergency Department (HOSPITAL_COMMUNITY): Payer: Medicare Other

## 2018-02-06 ENCOUNTER — Emergency Department (HOSPITAL_COMMUNITY)
Admission: EM | Admit: 2018-02-06 | Discharge: 2018-02-06 | Disposition: A | Discharge status: Home / Self Care | Payer: Medicare Other | Attending: Emergency Medicine | Admitting: Emergency Medicine

## 2018-02-06 ENCOUNTER — Other Ambulatory Visit: Payer: Self-pay

## 2018-02-06 ENCOUNTER — Encounter: Payer: Self-pay | Admitting: Radiation Therapy

## 2018-02-06 DIAGNOSIS — Z79899 Other long term (current) drug therapy: Secondary | ICD-10-CM | POA: Insufficient documentation

## 2018-02-06 DIAGNOSIS — J189 Pneumonia, unspecified organism: Secondary | ICD-10-CM | POA: Insufficient documentation

## 2018-02-06 DIAGNOSIS — Z7982 Long term (current) use of aspirin: Secondary | ICD-10-CM | POA: Insufficient documentation

## 2018-02-06 DIAGNOSIS — R0602 Shortness of breath: Secondary | ICD-10-CM | POA: Diagnosis not present

## 2018-02-06 DIAGNOSIS — R531 Weakness: Secondary | ICD-10-CM | POA: Diagnosis not present

## 2018-02-06 DIAGNOSIS — R404 Transient alteration of awareness: Secondary | ICD-10-CM | POA: Diagnosis not present

## 2018-02-06 DIAGNOSIS — R05 Cough: Secondary | ICD-10-CM | POA: Diagnosis not present

## 2018-02-06 LAB — CBC WITH DIFFERENTIAL/PLATELET
Basophils Absolute: 0 10*3/uL (ref 0.0–0.1)
Basophils Relative: 0 %
Eosinophils Absolute: 0.2 10*3/uL (ref 0.0–0.7)
Eosinophils Relative: 2 %
HCT: 37.4 % (ref 36.0–46.0)
Hemoglobin: 12.5 g/dL (ref 12.0–15.0)
Lymphocytes Relative: 8 %
Lymphs Abs: 0.5 10*3/uL — ABNORMAL LOW (ref 0.7–4.0)
MCH: 27.2 pg (ref 26.0–34.0)
MCHC: 33.4 g/dL (ref 30.0–36.0)
MCV: 81.5 fL (ref 78.0–100.0)
Monocytes Absolute: 0.4 10*3/uL (ref 0.1–1.0)
Monocytes Relative: 6 %
Neutro Abs: 5.3 10*3/uL (ref 1.7–7.7)
Neutrophils Relative %: 84 %
Platelets: 215 10*3/uL (ref 150–400)
RBC: 4.59 MIL/uL (ref 3.87–5.11)
RDW: 15.5 % (ref 11.5–15.5)
WBC: 6.4 10*3/uL (ref 4.0–10.5)

## 2018-02-06 LAB — URINALYSIS, ROUTINE W REFLEX MICROSCOPIC
Bilirubin Urine: NEGATIVE
Glucose, UA: NEGATIVE mg/dL
Hgb urine dipstick: NEGATIVE
Ketones, ur: 5 mg/dL — AB
Leukocytes, UA: NEGATIVE
Nitrite: NEGATIVE
Protein, ur: NEGATIVE mg/dL
Specific Gravity, Urine: 1.001 — ABNORMAL LOW (ref 1.005–1.030)
pH: 7 (ref 5.0–8.0)

## 2018-02-06 LAB — BASIC METABOLIC PANEL
Anion gap: 12 (ref 5–15)
BUN: 10 mg/dL (ref 6–20)
CO2: 25 mmol/L (ref 22–32)
Calcium: 9.6 mg/dL (ref 8.9–10.3)
Chloride: 94 mmol/L — ABNORMAL LOW (ref 101–111)
Creatinine, Ser: 0.66 mg/dL (ref 0.44–1.00)
GFR calc Af Amer: >60 mL/min (ref >60)
GFR calc non Af Amer: >60 mL/min (ref >60)
Glucose, Bld: 105 mg/dL — ABNORMAL HIGH (ref 65–99)
Potassium: 4 mmol/L (ref 3.5–5.1)
Sodium: 131 mmol/L — ABNORMAL LOW (ref 135–145)

## 2018-02-06 LAB — TROPONIN I: Troponin I: <0.03 ng/mL (ref <0.03)

## 2018-02-06 MED ORDER — LEVOFLOXACIN 750 MG PO TABS
750.0000 mg | ORAL_TABLET | Freq: Once | ORAL | Status: AC
  Administered 2018-02-06: 750 mg via ORAL
  Filled 2018-02-06: qty 1

## 2018-02-06 MED ORDER — LEVOFLOXACIN 500 MG PO TABS: 500.0000 mg | ORAL_TABLET | Freq: Every day | ORAL | 0 refills | Status: DC

## 2018-02-06 MED ORDER — SODIUM CHLORIDE 0.9 % IV BOLUS (SEPSIS)
1000.0000 mL | Freq: Once | INTRAVENOUS | Status: AC
  Administered 2018-02-06: 1000 mL via INTRAVENOUS

## 2018-02-06 NOTE — ED Notes (Signed)
Pt is aware that a urine sample is needed. Pt recently went to the restroom without alerting staff.  Pt sts she will let me know the next time she is in need to go so that she can provide sample.

## 2018-02-06 NOTE — ED Provider Notes (Signed)
[facility] DEPT Provider Note   CSN: [id number] Arrival date & time: 02/06/18  1459     History   Chief Complaint Chief Complaint  Patient presents with  . Weakness  . Chills    HPI [name] French is a 84 y.o. female.  HPI  89yF with chills. Onset this morning. Loss of appetite. For the preceding month she has had some generalized weakness. Feels like she just doesn't have as much energy as she typically does.   Past Medical History:  Diagnosis Date  . Allergic rhinitis   . Anemia    during pregnancy  . Arthritis   . Breast cancer ([facility]) 05/08/13   right upper inner, invasive mammary  . Breast cancer, right breast ([facility]) 04/16/2013   Underwent lumpectomy on 11/06/13. Path showed G1 ILC, 2.7 cm, neg margins, receptor+, Her2neg   . Diverticulosis   . 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 Hemorrhoids   . Hx of adenomatous colonic polyps 05/2002  . Lung cancer ([facility])   . Lung cancer ([facility]) 10/2017  . Meningioma ([facility])   . Osteoporosis   . Pneumonia    walking pneumonia  . Rosacea   . 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 Use of letrozole ([facility])    neoadjuvant antiestrogen therapy with letrozole 2.5 mg daily x 7 monhts    Patient Active Problem List   Diagnosis Date Noted  . Chest pain 12/25/2017  . Abnormal findings on radiological examination of gastrointestinal tract 11/16/2017  . Primary cancer of left lower lobe of lung ([facility]) 11/15/2017  . Non-small cell cancer of right lung ([facility]) 11/07/2017  . Lung mass 10/17/2017  . Breast cancer of upper-inner quadrant of right female breast ([facility]) 06/10/2013  . Mass of breast, left 05/31/2013  . PERSONAL HISTORY OF COLONIC POLYPS 07/26/2010    Past Surgical History:  Procedure Laterality Date  . BREAST BIOPSY Left 1960   lt br bx/benign  . BREAST EXCISIONAL BIOPSY Left   . BREAST LUMPECTOMY Right   . BREAST LUMPECTOMY WITH NEEDLE LOCALIZATION Bilateral 11/06/2013   Procedure: BREAST  LUMPECTOMY WITH NEEDLE LOCALIZATION;  Surgeon: Haywood Lasso, MD;  Location: [facility];  Service: General;  Laterality: Bilateral;  . COLONOSCOPY    . EYE SURGERY     both cataracts  . MOHS SURGERY Right    nose basal/squamous  . VIDEO BRONCHOSCOPY WITH ENDOBRONCHIAL NAVIGATION N/A 11/01/2017   Procedure: VIDEO BRONCHOSCOPY WITH ENDOBRONCHIAL NAVIGATION;  Surgeon: [name], MD;  Location: [facility];  Service: Thoracic;  Laterality: N/A;  . WRIST SURGERY  1990   lt    OB History    No data available       Home Medications    Prior to Admission medications   Medication Sig Start Date End Date Taking? Authorizing Provider  aspirin EC 81 MG tablet Take 81 mg daily by mouth.   Yes [provider]  cetirizine (ZYRTEC) 10 MG tablet Take 10 mg daily by mouth.    Yes [provider]  Cholecalciferol (VITAMIN D-3) 1000 UNITS CAPS Take 1,000 Units daily by mouth.    Yes [provider]  doxylamine, Sleep, (UNISOM) 25 MG tablet Take 25 mg at bedtime by mouth.   Yes [provider]  metoprolol tartrate (LOPRESSOR) 25 MG tablet Take 1 tablet (25 mg total) by mouth 2 (two) times daily. 01/29/18 02/28/18 Yes Martinique, Peter M, MD  naphazoline-pheniramine (NAPHCON-A) 0.025-0.3 %  ophthalmic solution Place 1-2 drops 4 (four) times daily as needed into both eyes for eye irritation or allergies.   Yes [provider]  Olopatadine HCl (PAZEO) 0.7 % SOLN Place 1-2 drops 3 (three) times daily as needed into both eyes (for allergy eyes).   Yes [provider]  pravastatin (PRAVACHOL) 40 MG tablet Take 40 mg daily by mouth.  03/31/13  Yes [provider]  Probiotic Product (ALIGN) 4 MG CAPS Take 4 mg daily by mouth.   Yes [provider]  Pseudoephedrine-Ibuprofen (ADVIL COLD/SINUS) 30-200 MG TABS Take 1 tablet by mouth daily as needed (cold symptoms).   Yes [provider]  ranitidine (ZANTAC 75) 75 MG  tablet Take 1 tablet (75 mg total) by mouth 2 (two) times daily. 12/27/17  Yes Tyler Pita, MD  SUPER B COMPLEX & C TABS Take 1 tablet by mouth daily. 10/17/16  Yes Nicholas Lose, MD  ibuprofen (ADVIL,MOTRIN) 200 MG tablet Take 200 mg by mouth 2 (two) times daily as needed (PAIN).    [provider]  naproxen sodium (ALEVE) 220 MG tablet Take 220 mg by mouth 2 (two) times daily as needed (ARTHRITIS PAIN).    [provider]  zolpidem (AMBIEN) 5 MG tablet Take 5 mg at bedtime as needed by mouth (for sleep.).  03/26/15   [provider]    Family History Family History  Problem Relation Age of Onset  . Heart disease Mother   . Prostate cancer Father   . Lung cancer Sister     Social History Social History   Tobacco Use  . Smoking status: Former Smoker    Packs/day: 0.50    Years: 25.00    Pack years: 12.50    Types: Cigarettes    Last attempt to quit: 12/19/1978    Years since quitting: 39.1  . Smokeless tobacco: Never Used  Substance Use Topics  . Alcohol use: Yes    Alcohol/week: 2.4 oz    Types: 4 Glasses of wine per week  . Drug use: No     Allergies   Codeine; Hydrocodone; and Neosporin [neomycin-bacitracin zn-polymyx]   Review of Systems Review of Systems  All systems reviewed and negative, other than as noted in HPI.  Physical Exam Updated Vital Signs BP 128/62 (BP Location: Left Arm)   Pulse 90   Temp 98.3 F (36.8 C) (Oral)   Resp 17   Ht 5\' 5"  (1.651 m)   Wt 57.2 kg (126 lb)   SpO2 94%   BMI 20.97 kg/m   Physical Exam  Constitutional: She appears well-developed and well-nourished. No distress.  HENT:  Head: Normocephalic and atraumatic.  Eyes: Conjunctivae are normal. Right eye exhibits no discharge. Left eye exhibits no discharge.  Neck: Neck supple.  Cardiovascular: Normal rate, regular rhythm and normal heart sounds. Exam reveals no gallop and no friction rub.  No murmur heard. Pulmonary/Chest: Effort normal and  breath sounds normal. No respiratory distress.  Abdominal: Soft. She exhibits no distension. There is no tenderness.  Musculoskeletal: She exhibits no edema or tenderness.  Neurological: She is alert.  Skin: Skin is warm and dry.  Psychiatric: She has a normal mood and affect. Her behavior is normal. Thought content normal.  Nursing note and vitals reviewed.    ED Treatments / Results  Labs (all labs ordered are listed, but only abnormal results are displayed) Labs Reviewed  CBC WITH DIFFERENTIAL/PLATELET - Abnormal; Notable for the following components:  Result Value   Lymphs Abs 0.5 (*)    All other components within normal limits  BASIC METABOLIC PANEL - Abnormal; Notable for the following components:   Sodium 131 (*)    Chloride 94 (*)    Glucose, Bld 105 (*)    All other components within normal limits  URINALYSIS, ROUTINE W REFLEX MICROSCOPIC - Abnormal; Notable for the following components:   Color, Urine STRAW (*)    Specific Gravity, Urine 1.001 (*)    Ketones, ur 5 (*)    All other components within normal limits  TROPONIN I    EKG  EKG Interpretation None       Radiology No results found.  Procedures Procedures (including critical care time)  Medications Ordered in ED Medications  sodium chloride 0.9 % bolus 1,000 mL (not administered)     Initial Impression / Assessment and Plan / ED Course  I have reviewed the triage vital signs and the nursing notes.  Pertinent labs & imaging results that were available during my care of the patient were reviewed by me and considered in my medical decision making (see chart for details).     89yF with generalized weakness and chills. CXR with possible pneumonia although clinical picture not convincing. Recent admit. Will cover for HCAP. NO increased WOB. Generally very well appearing, particularly for her age. I feel appropriate for outpt tx.   Final Clinical Impressions(s) / ED Diagnoses   Final  diagnoses:  HCAP (healthcare-associated pneumonia)    ED Discharge Orders    None       Virgel Manifold, MD 02/08/18 1512

## 2018-02-06 NOTE — Progress Notes (Signed)
[name] initial Lung Cancer staging scans revealed a non-symptomatic meningioma. A follow-up brain MRI was completed on 01/24/18. The meningioma remains stable and non-symptomatic. Based on the 01/29/18 Brain and Spine Multidisciplinary Conference discussion, we will not continue surveillance scans for the meningioma. [name] French will continue follow-up for her lung cancer which will include restaging systemic scans. The meningioma can be seen then. And of course, if she becomes symptomatic, a brain scan will be done for evaluation.   Mont Dutton R.T.(R)(T) Special Procedures Navigator

## 2018-02-06 NOTE — ED Triage Notes (Signed)
EMS reports from [facility], pt c/o chills this morning, loss of appetite and generalized weakness since this morning. Hx of lung cancer and radiation completed Jan 31  BP128/62 HR 82 Sp02 98 RA Resp 16 CBG 113

## 2018-02-12 DIAGNOSIS — N183 Chronic kidney disease, stage 3 (moderate): Secondary | ICD-10-CM | POA: Diagnosis not present

## 2018-02-12 DIAGNOSIS — J90 Pleural effusion, not elsewhere classified: Secondary | ICD-10-CM | POA: Diagnosis not present

## 2018-02-12 DIAGNOSIS — Z6821 Body mass index (BMI) 21.0-21.9, adult: Secondary | ICD-10-CM | POA: Diagnosis not present

## 2018-02-12 DIAGNOSIS — R5383 Other fatigue: Secondary | ICD-10-CM | POA: Diagnosis not present

## 2018-02-12 DIAGNOSIS — C3490 Malignant neoplasm of unspecified part of unspecified bronchus or lung: Secondary | ICD-10-CM | POA: Diagnosis not present

## 2018-02-12 DIAGNOSIS — C3432 Malignant neoplasm of lower lobe, left bronchus or lung: Secondary | ICD-10-CM | POA: Diagnosis not present

## 2018-02-12 DIAGNOSIS — J189 Pneumonia, unspecified organism: Secondary | ICD-10-CM | POA: Diagnosis not present

## 2018-02-12 DIAGNOSIS — C7989 Secondary malignant neoplasm of other specified sites: Secondary | ICD-10-CM | POA: Diagnosis not present

## 2018-02-12 DIAGNOSIS — E871 Hypo-osmolality and hyponatremia: Secondary | ICD-10-CM | POA: Diagnosis not present

## 2018-02-12 DIAGNOSIS — R000 Tachycardia, unspecified: Secondary | ICD-10-CM | POA: Diagnosis not present

## 2018-02-12 MED ORDER — BUPIVACAINE HCL 0.5 % IJ SOLN
3.0000 mL | INTRAMUSCULAR | Status: AC | PRN
  Administered 2018-01-24: 3 mL via INTRA_ARTICULAR

## 2018-02-12 MED ORDER — TRIAMCINOLONE ACETONIDE 40 MG/ML IJ SUSP
80.0000 mg | INTRAMUSCULAR | Status: AC | PRN
  Administered 2018-01-24: 80 mg via INTRA_ARTICULAR

## 2018-02-12 NOTE — Telephone Encounter (Signed)
Pt cancelled previsit and colonoscopy states she has pneumonia and wants to feel better before rescheduling.

## 2018-02-13 ENCOUNTER — Other Ambulatory Visit (HOSPITAL_COMMUNITY): Payer: Self-pay | Admitting: Internal Medicine

## 2018-02-13 ENCOUNTER — Telehealth: Payer: Self-pay | Admitting: Cardiology

## 2018-02-13 DIAGNOSIS — J90 Pleural effusion, not elsewhere classified: Secondary | ICD-10-CM

## 2018-02-13 NOTE — Telephone Encounter (Signed)
New Message   Pt c/o medication issue:  1. Name of Medication: metoprolol tartrate (LOPRESSOR) 25 MG tablet  2. How are you currently taking this medication (dosage and times per day)? Take 1 tablet (25 mg total) by mouth 2 (two) times daily  3. Are you having a reaction (difficulty breathing--STAT)? no  4. What is your medication issue? Pt says the dosage doesn't seem to be helping and that she is so lethargic she can barely move

## 2018-02-13 NOTE — Telephone Encounter (Signed)
Returned call to patient Dr.Jordan's recommendations given.Advised to call back if not better.

## 2018-02-13 NOTE — Telephone Encounter (Signed)
She can decrease back to 1/2 tablet twice a day.  Peter Martinique MD, [facility]

## 2018-02-13 NOTE — Telephone Encounter (Signed)
Patient of Dr. Martinique seen on 01/29/18 - metoprolol tartrate increased to 25mg  BID. She was to f/up PRN. Med was increased d/t HR of 104bpm.   Returned call to patient. She states she is "so lethargic she can hardly put one foot in front of her". She had previously been on a half tablet BID. She would like to know if there is a different medication she can take or if she can decrease dose. She does not monitor her BP or HR at home.   Advised would route message to MD to review and advise.

## 2018-02-13 NOTE — Telephone Encounter (Signed)
Follow up     Patient forgot to tell you she is taking: Levofloxacin

## 2018-02-16 ENCOUNTER — Inpatient Hospital Stay
Admission: RE | Admit: 2018-02-16 | Discharge: 2018-02-16 | Disposition: A | Discharge status: Home / Self Care | Payer: Medicare Other | Source: Ambulatory Visit | Attending: Urology | Admitting: Urology

## 2018-02-16 ENCOUNTER — Encounter: Payer: Self-pay | Admitting: *Deleted

## 2018-02-16 ENCOUNTER — Ambulatory Visit: Payer: Self-pay | Admitting: Urology

## 2018-02-16 ENCOUNTER — Encounter: Payer: Medicare Other | Admitting: Gastroenterology

## 2018-02-16 NOTE — Progress Notes (Signed)
Rio about her 1030 appointment for this morning to discover she was told to come on 02-20-18 at 1530 but she had 1100 as her appointment time.  I apologized for the mix up with her appointment and told her I would check with Ashlyn about moving her up closer to Dr. Geralyn Flash appointment and call her back. [place] with Ashlyn new appointment  time 0930 02-20-18. 1032 Called back with her new appointment on 02-20-18 at 0930 she was okay with this time.

## 2018-02-20 ENCOUNTER — Ambulatory Visit
Admission: RE | Admit: 2018-02-20 | Discharge: 2018-02-20 | Disposition: A | Discharge status: Home / Self Care | Payer: Medicare Other | Source: Ambulatory Visit | Attending: Urology | Admitting: Urology

## 2018-02-20 ENCOUNTER — Other Ambulatory Visit: Payer: Self-pay

## 2018-02-20 ENCOUNTER — Encounter: Payer: Self-pay | Admitting: Urology

## 2018-02-20 ENCOUNTER — Inpatient Hospital Stay: Payer: Medicare Other | Attending: Hematology and Oncology | Admitting: Hematology and Oncology

## 2018-02-20 VITALS — BP 118/62 | HR 120 | Temp 97.3°F | Resp 18 | Ht 65.0 in | Wt 123.8 lb

## 2018-02-20 DIAGNOSIS — C50211 Malignant neoplasm of upper-inner quadrant of right female breast: Secondary | ICD-10-CM | POA: Insufficient documentation

## 2018-02-20 DIAGNOSIS — L814 Other melanin hyperpigmentation: Secondary | ICD-10-CM | POA: Diagnosis not present

## 2018-02-20 DIAGNOSIS — C3491 Malignant neoplasm of unspecified part of right bronchus or lung: Secondary | ICD-10-CM

## 2018-02-20 DIAGNOSIS — R000 Tachycardia, unspecified: Secondary | ICD-10-CM | POA: Diagnosis not present

## 2018-02-20 DIAGNOSIS — C3432 Malignant neoplasm of lower lobe, left bronchus or lung: Secondary | ICD-10-CM | POA: Insufficient documentation

## 2018-02-20 DIAGNOSIS — D329 Benign neoplasm of meninges, unspecified: Secondary | ICD-10-CM | POA: Insufficient documentation

## 2018-02-20 DIAGNOSIS — Z170 Estrogen receptor positive status [ER+]: Secondary | ICD-10-CM | POA: Insufficient documentation

## 2018-02-20 DIAGNOSIS — Z7982 Long term (current) use of aspirin: Secondary | ICD-10-CM | POA: Insufficient documentation

## 2018-02-20 DIAGNOSIS — Z87891 Personal history of nicotine dependence: Secondary | ICD-10-CM | POA: Diagnosis not present

## 2018-02-20 DIAGNOSIS — C7802 Secondary malignant neoplasm of left lung: Secondary | ICD-10-CM | POA: Insufficient documentation

## 2018-02-20 DIAGNOSIS — Z853 Personal history of malignant neoplasm of breast: Secondary | ICD-10-CM | POA: Diagnosis not present

## 2018-02-20 DIAGNOSIS — C3431 Malignant neoplasm of lower lobe, right bronchus or lung: Secondary | ICD-10-CM

## 2018-02-20 DIAGNOSIS — Z923 Personal history of irradiation: Secondary | ICD-10-CM | POA: Diagnosis not present

## 2018-02-20 DIAGNOSIS — Z79811 Long term (current) use of aromatase inhibitors: Secondary | ICD-10-CM | POA: Diagnosis not present

## 2018-02-20 DIAGNOSIS — C342 Malignant neoplasm of middle lobe, bronchus or lung: Secondary | ICD-10-CM | POA: Diagnosis not present

## 2018-02-20 DIAGNOSIS — J90 Pleural effusion, not elsewhere classified: Secondary | ICD-10-CM | POA: Diagnosis not present

## 2018-02-20 DIAGNOSIS — Z79899 Other long term (current) drug therapy: Secondary | ICD-10-CM | POA: Insufficient documentation

## 2018-02-20 DIAGNOSIS — L819 Disorder of pigmentation, unspecified: Secondary | ICD-10-CM | POA: Diagnosis not present

## 2018-02-20 NOTE — Addendum Note (Signed)
Encounter addended by: Malena Edman, RN on: 02/20/2018 10:35 AM  Actions taken: Charge Capture section accepted

## 2018-02-20 NOTE — Progress Notes (Signed)
Patient Care Team: Tisovec, Fransico Him, MD as PCP - General (Internal Medicine) Marcy Panning, MD as Consulting Physician (Oncology)  DIAGNOSIS:  Encounter Diagnoses  Name Primary?  . Malignant neoplasm of upper-inner quadrant of right breast in female, estrogen receptor positive ([place])   . Non-small cell cancer of right lung ([place])     SUMMARY OF ONCOLOGIC HISTORY:   Breast cancer of upper-inner quadrant of right female breast ([place])   05/08/2013 Initial Biopsy    Right: grade 1-2 invasive mammary carcinoma ER positive PR positive HER-2/neu negative with Ki-67 30% (MRI 2.8 cm)second smaller mass together 3.8cm      05/11/2013 - 11/04/2013 Anti-estrogen oral therapy    Letrozole 2.5 mg Neoadjuvant anti-estrogen therapy      11/06/2013 Surgery    Bilateral Lumpectomies: Left: sclerosing lesion with ALH fibrocystic changes with microcalcifications. Right: Grade 1 ILC 2.7 cm with LCIS       Radiation Therapy    Patient declined      11/19/2013 - 06/09/2015 Anti-estrogen oral therapy    Letrozole 2.5 mg (stopped for arthralgias and myalgias and fatigue accompanied with hair loss)       Non-small cell cancer of right lung ([place])   10/20/2017 PET scan    6 cm central left lower lobe pulmonary mass is markedly hypermetabolic with SUV max = 70.3 and extends into the left hilum.  The sub solid 2.4 cm pulmonary nodule in the right middle lobe also shows FDG accumulation with SUV max = 1.9. No evidence for hypermetabolic mediastinal or right hilar lymphadenopathy. L2 uptake degenerative.  LLL:T3N0M0 stage IIb clinical stage; RML: T2N0 Stage 1B      10/20/2017 Imaging    MRI brain: No metastatic disease, right inferior parietal convexity meningioma 2.9 x 2.7 x 1.4 cm.  This indents the brain and associated with mild brain edema      11/01/2017 Initial Diagnosis    Transbronchial needle aspiration right middle lobe and left lower lobe brushings: Both are positive for malignant cells  consistent with non-small cell lung cancer       11/29/2017 - 01/18/2018 Radiation Therapy    Radiation      12/01/2017 Pathology Results    Foundation 1:TPS score: 5%; MS-Stable, TMG High, AKT2 Amp, RB1, TP 53 (no mutations noted in EGFR, K-ras, Al, BRAF, RET, ERBB2, Ros 1)       CHIEF COMPLIANT: Follow-up after radiation is complete  INTERVAL HISTORY: [name] French is a 84 year old with above-mentioned history of breast cancer and lung cancer.  She underwent radiation therapy.  She is too frail to receive any systemic chemotherapy.  She is here today for follow-up and to discuss treatment options and scans.  She is recovered very well from the prior radiation therapy.  Radiation was completed on 01/18/2018.  CT scan done prior to the completion of radiation on 12/25/2017 already showed interval decrease in the size of the left hilar/infrahilar lung mass.  Patient tells me that she has had a rough time since radiation has been completed.  She had a pneumonia as well as tachycardia.  She was placed on metoprolol and it appears that a full dose of metoprolol she felt dizzy weak and loss of appetite.  The dose was reduced and even then she does not feel too well.  She follows with cardiology.  REVIEW OF SYSTEMS:   Constitutional: Denies fevers, chills or abnormal weight loss Eyes: Denies blurriness of vision Ears, nose, mouth, throat, and face: Denies mucositis  or sore throat Respiratory: Denies cough, dyspnea or wheezes Cardiovascular: Denies palpitation, chest discomfort Gastrointestinal:  Denies nausea, heartburn or change in bowel habits Skin: Denies abnormal skin rashes Lymphatics: Denies new lymphadenopathy or easy bruising Neurological:Denies numbness, tingling or new weaknesses Behavioral/Psych: Mood is stable, no new changes  Extremities: No lower extremity edema Breast:  denies any pain or lumps or nodules in either breasts All other systems were reviewed with the patient and are  negative.  I have reviewed the past medical history, past surgical history, social history and family history with the patient and they are unchanged from previous note.  ALLERGIES:  is allergic to codeine; hydrocodone; and neosporin [neomycin-bacitracin zn-polymyx].  MEDICATIONS:  Current Outpatient Medications  Medication Sig Dispense Refill  . aspirin EC 81 MG tablet Take 81 mg daily by mouth.    . cetirizine (ZYRTEC) 10 MG tablet Take 10 mg daily by mouth.     . Cholecalciferol (VITAMIN D-3) 1000 UNITS CAPS Take 1,000 Units daily by mouth.     . doxylamine, Sleep, (UNISOM) 25 MG tablet Take 25 mg at bedtime by mouth.    Marland Kitchen ibuprofen (ADVIL,MOTRIN) 200 MG tablet Take 200 mg by mouth 2 (two) times daily as needed (PAIN).    Marland Kitchen metoprolol tartrate (LOPRESSOR) 25 MG tablet Take 1/2 tablet ( 12.5 mg ) twice a day 180 tablet 3  . naphazoline-pheniramine (NAPHCON-A) 0.025-0.3 % ophthalmic solution Place 1-2 drops 4 (four) times daily as needed into both eyes for eye irritation or allergies.    . naproxen sodium (ALEVE) 220 MG tablet Take 220 mg by mouth 2 (two) times daily as needed (ARTHRITIS PAIN).    Marland Kitchen Olopatadine HCl (PAZEO) 0.7 % SOLN Place 1-2 drops 3 (three) times daily as needed into both eyes (for allergy eyes).    . pravastatin (PRAVACHOL) 40 MG tablet Take 40 mg daily by mouth.     . Probiotic Product (ALIGN) 4 MG CAPS Take 4 mg daily by mouth.    . Pseudoephedrine-Ibuprofen (ADVIL COLD/SINUS) 30-200 MG TABS Take 1 tablet by mouth daily as needed (cold symptoms).    . ranitidine (ZANTAC 75) 75 MG tablet Take 1 tablet (75 mg total) by mouth 2 (two) times daily. 60 tablet 5  . SUPER B COMPLEX & C TABS Take 1 tablet by mouth daily. (Patient not taking: Reported on 02/20/2018)  0  . zolpidem (AMBIEN) 5 MG tablet Take 5 mg at bedtime as needed by mouth (for sleep.).   2   Current Facility-Administered Medications  Medication Dose Route Frequency Provider Last Rate Last Dose  . 0.9 %  sodium  chloride infusion  500 mL Intravenous Once Ladene Artist, MD        PHYSICAL EXAMINATION: ECOG PERFORMANCE STATUS: 1 - Symptomatic but completely ambulatory  Vitals:   02/20/18 1025  BP: 118/62  Pulse: 82  Resp: 16  Temp: 98 F (36.7 C)  SpO2: 91%   Filed Weights   02/20/18 1025  Weight: 123 lb 12.8 oz (56.2 kg)    GENERAL:alert, no distress and comfortable SKIN: skin color, texture, turgor are normal, no rashes or significant lesions EYES: normal, Conjunctiva are pink and non-injected, sclera clear OROPHARYNX:no exudate, no erythema and lips, buccal mucosa, and tongue normal  NECK: supple, thyroid normal size, non-tender, without nodularity LYMPH:  no palpable lymphadenopathy in the cervical, axillary or inguinal LUNGS: clear to auscultation and percussion with normal breathing effort HEART: regular rate & rhythm and no murmurs and no lower  extremity edema ABDOMEN:abdomen soft, non-tender and normal bowel sounds MUSCULOSKELETAL:no cyanosis of digits and no clubbing  NEURO: alert & oriented x 3 with fluent speech, no focal motor/sensory deficits EXTREMITIES: No lower extremity edema  LABORATORY DATA:  I have reviewed the data as listed CMP Latest Ref Rng & Units 02/06/2018 12/26/2017 12/25/2017  Glucose 65 - 99 mg/dL 105(H) 109(H) 122(H)  BUN 6 - 20 mg/dL _0 Creatinine 0.44 - 1.00 mg/dL 0.66 0.85 0.64  Sodium 135 - 145 mmol/L 131(L) 137 135  Potassium 3.5 - 5.1 mmol/L 4.0 3.6 4.2  Chloride 101 - 111 mmol/L 94(L) 105 101  CO2 22 - 32 mmol/L _1 Calcium 8.9 - 10.3 mg/dL 9.6 9.0 9.7  Total Protein 6.5 - 8.1 g/dL - - -  Total Bilirubin 0.3 - 1.2 mg/dL - - -  Alkaline Phos 38 - 126 U/L - - -  AST 15 - 41 U/L - - -  ALT 14 - 54 U/L - - -    Lab Results  Component Value Date   WBC 6.4 02/06/2018   HGB 12.5 02/06/2018   HCT 37.4 02/06/2018   MCV 81.5 02/06/2018   PLT 215 02/06/2018   NEUTROABS 5.3 02/06/2018    ASSESSMENT & PLAN:  Breast cancer of  upper-inner quadrant of right female breast    Non-small cell cancer of right lung ([facility]) 6 cm central left lower lobe pulmonary mass is markedly hypermetabolic with SUV max = 09.8 and extends into the left hilum. The sub solid 2.4 cm pulmonary nodule in the right middle lobe also shows FDG accumulation with SUV max = 1.9. No evidence for hypermetabolic mediastinal or right hilar lymphadenopathy. L2 uptake degenerative.    Staging: LLL:T3N0M0 stage IIb clinical stage; RML: T2N0 Stage 1B It is also possible that the patient may have stage IV lung cancer based upon bilateral lung involvement TPS: 5% Foundation One: No mutations that have improved targeted therapies. TMB: High (29 mutations/Mb) microsatellite stable, AKT2 amplification, RB 1, TP 53  Treatment: 1.  Radiation therapy 11/29/2018-01/18/2018 2. current treatment: Observation Radiation oncology is planning her scans. I discussed with her that she could follow with radiation oncology and can see [place] on an as-needed basis.  I spent 25 minutes talking to the patient of which more than half was spent in counseling and coordination of care.  No orders of the defined types were placed in this encounter.  The patient has a good understanding of the overall plan. she agrees with it. she will call with any problems that may develop before the next visit here.   Harriette Ohara, MD 02/20/18

## 2018-02-20 NOTE — Progress Notes (Addendum)
Radiation Oncology         [phone] ________________________________  Name: [name] French MRN: [id number]  Date: 02/20/2018  DOB: June 15, 1926  Post Treatment Note  CC: Tisovec, Fransico Him, MD  Nicholas Lose, MD  Diagnosis:   84 y.o. woman with newly diagnosed synchronous NSCLC, adenocarcinoma with a 6 cm LLL mass extending into the left hilum (stage IIB, T3N0M0) and a 2.4 cm RML mass (stage IB T2N0).     Interval Since Last Radiation:  4 weeks  11/28/17 - 01/18/18:  66 Gy directed to the LLL and RML lung lesions in 33 fractions of 2 Gy each.  Narrative:  In summary, She has a remote history of breast cancer diagnosed and treated in 2014, an was initially seen in consult on 11/15/18 at the request of Dr. Lindi Adie for newly diagnosed non-small cell lung cancer and incidental finding of meningioma. She presented to her PCP in October 2018 with persistent dry cough that led to an isolated episode of hemoptysis. She denied further episodes of hemoptysis since that time. She presented to her PCP for evaluation and was being worked up for possible pneumonia with a chest x-ray and a CT scan of the chest. CT done on 10/11/2017 showed a 6.0 cm LLL lung mass with additional lung nodules, including a 2.4 cm pulmonary nodule in the RML as well as 0.3 cm satellite nodules. There was also a hypodense 0.4 cm RLL lesion. A PET CT scan on 112/18 demonstrated a hypermetabolic LLL mass extending into the left hilum with SUV of 26.8 (stage IIb, T3N0M0) and a mildly hypermetabolic RML mass with SUV max of 1.9 (stage IB T2N0).  There were no hypermetabolic hilar or mediastinal nodes.  She was evaluated with Dr. Roxan Hockey and underwent bilateral navigational bronchoscopy with biopsies of the lung masses on 11/01/17 which revealed bilateral non-small cell lung cancer, poorly differentiated adenocarcinoma.   An MRI brain was performed on 10/20/2017 for disease staging and incidentally showed a right inferior parietal  convexity meningioma measuring 2.9 x 2.7 x 1.4 cm. There was no evidence of metastatic disease. Her imaging was reviewed at the multidisciplinary brain conference and consensus recommendation was to repeat a scan in 3 months to assess for progression/enlargement. Repeat brain MRI was performed on 01/24/2018 demonstrating a persistent right temporoparietal meningioma unchanged in size with only mild vasogenic edema which appears improved from her previous study.  There was no evidence of metastatic disease to the brain.  This study was reviewed at the multidisciplinary brain conference on 01/29/2018 and consensus recommendation was to only repeat brain imaging if the patient develops symptoms.  The patient returns today for routine follow-up.  She has not received any systemic treatment thus far, as the feeling was that concurrent chemoradiation would be too toxic for the patient due to her age.  However, molecular studies were ordered to assess for a potential role of immunotherapy if she demonstrated PD1 expression greater than 50%.  Unfortunately, her molecular studies returned showing only a very low expression of PD 1 at 5%.  There were no other identifiable mutations noted.  She tolerated radiation treatment relatively well. She denied having any pain but did report mild dysphagia, a dry cough and sore throat, but no hoarseness noted. She reported having SOB with great exertion only and was hospitalized on 12/25/17 under observation for tachycardia, chest pain and increased SOB.  Prior to completing treatment the chest pain was resolved. She did use Carefate, Sonafine, and Radaiplex as directed  for symptom management. There was mild hyperpigmentation of the middle back without desquamation.                             On review of systems, the patient states that she is doing well overall.  She has recently recovered from pneumonia which was treated back on February 06, 2018.  Currently, she denies increased  shortness of breath, chest pain, dysphagia, fever, chills, productive cough or hemoptysis.  She was recently started on metoprolol for tachycardia and since starting this medication, has noted significant decreased appetite, decreased energy level and depressed mood.  She does not have a scheduled follow-up with her cardiologist but I have advised her to contact them to discuss medication adjustment.  She has been forcing herself to eat to maintain her weight.  She denies headaches, changes in visual or auditory acuity, difficulty with speech or word finding, imbalance, tinnitus, tremor or seizure activity.  ALLERGIES:  is allergic to codeine; hydrocodone; and neosporin [neomycin-bacitracin zn-polymyx].  Meds: Current Outpatient Medications  Medication Sig Dispense Refill  . Cholecalciferol (VITAMIN D-3) 1000 UNITS CAPS Take 1,000 Units daily by mouth.     . doxylamine, Sleep, (UNISOM) 25 MG tablet Take 25 mg at bedtime by mouth.    Marland Kitchen ibuprofen (ADVIL,MOTRIN) 200 MG tablet Take 200 mg by mouth 2 (two) times daily as needed (PAIN).    Marland Kitchen metoprolol tartrate (LOPRESSOR) 25 MG tablet Take 1/2 tablet ( 12.5 mg ) twice a day 180 tablet 3  . naphazoline-pheniramine (NAPHCON-A) 0.025-0.3 % ophthalmic solution Place 1-2 drops 4 (four) times daily as needed into both eyes for eye irritation or allergies.    . pravastatin (PRAVACHOL) 40 MG tablet Take 40 mg daily by mouth.     . Probiotic Product (ALIGN) 4 MG CAPS Take 4 mg daily by mouth.    . ranitidine (ZANTAC 75) 75 MG tablet Take 1 tablet (75 mg total) by mouth 2 (two) times daily. 60 tablet 5  . zolpidem (AMBIEN) 5 MG tablet Take 5 mg at bedtime as needed by mouth (for sleep.).   2  . aspirin EC 81 MG tablet Take 81 mg daily by mouth.    . cetirizine (ZYRTEC) 10 MG tablet Take 10 mg daily by mouth.     . naproxen sodium (ALEVE) 220 MG tablet Take 220 mg by mouth 2 (two) times daily as needed (ARTHRITIS PAIN).    Marland Kitchen Olopatadine HCl (PAZEO) 0.7 % SOLN  Place 1-2 drops 3 (three) times daily as needed into both eyes (for allergy eyes).    . Pseudoephedrine-Ibuprofen (ADVIL COLD/SINUS) 30-200 MG TABS Take 1 tablet by mouth daily as needed (cold symptoms).    . SUPER B COMPLEX & C TABS Take 1 tablet by mouth daily. (Patient not taking: Reported on 02/20/2018)  0   Current Facility-Administered Medications  Medication Dose Route Frequency Provider Last Rate Last Dose  . 0.9 %  sodium chloride infusion  500 mL Intravenous Once Ladene Artist, MD        Physical Findings:  height is 5\' 5"  (1.651 m) and weight is 123 lb 12.8 oz (56.2 kg). Her oral temperature is 97.3 F (36.3 C) (abnormal). Her blood pressure is 118/62 and her pulse is 120 (abnormal). Her respiration is 18 and oxygen saturation is 100%.  Pain Assessment Pain Score: 0-No pain/10 In general this is a well appearing Caucasian female in no acute distress.  She's  alert and oriented x4 and appropriate throughout the examination. Cardiopulmonary assessment is negative for acute distress and she exhibits normal effort.  EOMs intact bilaterally and PERRLA.  She appears grossly neurologically intact.  Lab Findings: Lab Results  Component Value Date   WBC 6.4 02/06/2018   HGB 12.5 02/06/2018   HCT 37.4 02/06/2018   MCV 81.5 02/06/2018   PLT 215 02/06/2018     Radiographic Findings: Dg Chest 2 View  Result Date: 02/06/2018 CLINICAL DATA:  Cough and shortness of breath. History of lung cancer and radiation therapy. EXAM: CHEST  2 VIEW COMPARISON:  Chest x-ray and CT chest dated December 25, 2017. FINDINGS: The heart size and mediastinal contours are within normal limits. Normal pulmonary vascularity. Interval decrease in size of now small left pleural effusion. New streaky airspace disease in both upper lobes. No pneumothorax. No acute osseous abnormality. IMPRESSION: 1. New streaky airspace disease in both upper lobes, concerning for multifocal pneumonia. 2. Interval decrease in size of  now small left pleural effusion. Electronically Signed   By: Titus Dubin M.D.   On: 02/06/2018 16:46   Mr Jeri Cos HY Contrast  Result Date: 01/24/2018 CLINICAL DATA:  Meningioma untreated. Lung cancer diagnosis November 2018. Personal history of breast cancer Creatinine was obtained on site at [facility] at [address] Results: Creatinine 0.7 mg/dL. EXAM: MRI HEAD WITHOUT AND WITH CONTRAST TECHNIQUE: Multiplanar, multiecho pulse sequences of the brain and surrounding structures were obtained without and with intravenous contrast. CONTRAST:  32mL MULTIHANCE GADOBENATE DIMEGLUMINE 529 MG/ML IV SOLN COMPARISON:  MRI 10/20/2017 FINDINGS: Brain: Enhancing dural-based mass in the right temporoparietal region compatible with meningioma is unchanged in size. There is a dural tail. The enhancing mass measures approximately 26 x 14 x 29 mm unchanged. Mild mass-effect on the adjacent cortex again noted. Mild vasogenic edema has improved in the interval. No enhancing metastatic deposits are identified in the brain. Mild atrophy. No acute infarct or hemorrhage. No significant chronic ischemia. Vascular: Normal arterial flow void Skull and upper cervical spine: Negative Sinuses/Orbits: Bilateral cataract removal. Minimal mucosal edema paranasal sinuses Other: None IMPRESSION: Negative for metastatic disease to the brain Right temporoparietal meningioma unchanged in size. Mild vasogenic edema in the adjacent white matter improved from the prior study. Electronically Signed   By: Franchot Gallo M.D.   On: 01/24/2018 12:40   Xr C-arm No Report  Result Date: 01/24/2018 Please see Notes or Procedures tab for imaging impression.   Impression/Plan: 1. 84 y.o. woman with synchronous NSCLC, adenocarcinoma with a 6 cm LLL mass extending into the left hilum (stage IIB, T3N0M0) and a 2.4 cm RML mass (stage IB T2N0).  She appears to have recovered well from the effects of radiotherapy.  We will plan to obtain  a CT of the chest in the next 2-3 weeks to assess her treatment response.  I will plan to call her with those results by phone.  If that scan appears stable, she will proceed with serial CT chest surveillance every 3-6 months to rule out disease progression or recurrence. Per Dr. Geralyn Flash preference, we will continue to follow her to review results after each scan and he will see her on an as needed basis.  She knows to call at any time with any questions or concerns related to her previous radiotherapy.  2.         Meningioma. This was an incidental finding and patient is currently without neurologic or systemic complaints.  It does  not appear to be causing a mass effect on any structures in the brain and remains stable on her most recent follow-up MRI brain from 01/24/2018. The consensus at brain conference from 01/29/2018 is to only repeat brain imaging if she becomes symptomatic.  Otherwise we will just follow this expectantly and see her back as needed. She is in agreement with and is comfortable with this plan.  3. Tachycardia. Her heart rate remains elevated today in the office, at 120bpm, despite being on Metoprolol.  She is not tolerating the side effects of metoprolol well at all.  I have advised that she call for a follow-up appointment with her cardiologist to discuss potential medication adjustment.     Nicholos Johns, PA-C

## 2018-02-20 NOTE — Assessment & Plan Note (Signed)
6 cm central left lower lobe pulmonary mass is markedly hypermetabolic with SUV max = 88.4 and extends into the left hilum. The sub solid 2.4 cm pulmonary nodule in the right middle lobe also shows FDG accumulation with SUV max = 1.9. No evidence for hypermetabolic mediastinal or right hilar lymphadenopathy. L2 uptake degenerative.    Staging: LLL:T3N0M0 stage IIb clinical stage; RML: T2N0 Stage 1B It is also possible that the patient may have stage IV lung cancer based upon bilateral lung involvement TPS: 5% Foundation One: No mutations that have improved targeted therapies. TMB: High (29 mutations/Mb) microsatellite stable, AKT2 amplification, RB 1, TP 53  Treatment: 1.  Radiation therapy 11/29/2018-01/18/2018 2. current treatment: Observation Plan is to obtain CT scans every 2-3 months and follow-up

## 2018-02-21 ENCOUNTER — Telehealth: Payer: Self-pay | Admitting: *Deleted

## 2018-02-21 NOTE — Telephone Encounter (Signed)
CALLED PATIENT TO INFORM OF CT FOR 03-07-18- ARRIVAL TIME - 11:15 AM @ [facility], PT. TO HAVE WATER ONLY - 4 HRS. PRIOR TO TEST, ASHLYN TO CALL PATIENT WITH RESULTS, LVM FOR A RETURN CALL

## 2018-02-21 NOTE — Addendum Note (Signed)
Encounter addended by: Freeman Caldron, PA-C on: 02/21/2018 11:29 AM  Actions taken: Sign clinical note

## 2018-02-22 ENCOUNTER — Telehealth: Payer: Self-pay | Admitting: Cardiology

## 2018-02-22 NOTE — Telephone Encounter (Signed)
New Message:     Pt says the Metoprolol is just not working for her,she have so many side effects from it. Pt says she needs to be seen.

## 2018-02-22 NOTE — Telephone Encounter (Signed)
Spoke to patient she walked to health nurse at [facility] B/P 98/58 pulse 121 after resting pulse 99.Patient will hold afternoon dose of Metoprolol

## 2018-02-22 NOTE — Telephone Encounter (Signed)
Returned call to patient.She stated she cannot take Metoprolol 25 mg 1/2 tablet twice a day.Stated she has no appetite,feels like a zombie.

## 2018-02-23 NOTE — Telephone Encounter (Signed)
Spoke to patient B/P this morning 118/70 pulse 124 after resting 118 checked by [facility] RN.Stated she was agitated and thinks that is why heart rate was fast.Stated she is going to buy a home B/P monitor.Advised I spoke to Dr.Jordan he advised since Metoprolol makes her feel bad she can stop and see how she does.Advised to take Metoprolol 25 mg 1/4 tablet twice a day for 2 days then stop.She will monitor pulse and B/P and call back in 2 weeks to report readings.

## 2018-02-23 NOTE — Addendum Note (Signed)
Addended by: Kathyrn Lass on: 02/23/2018 10:52 AM   Modules accepted: Orders

## 2018-02-26 ENCOUNTER — Telehealth: Payer: Self-pay | Admitting: Cardiology

## 2018-02-26 ENCOUNTER — Other Ambulatory Visit: Payer: Self-pay

## 2018-02-26 NOTE — Telephone Encounter (Signed)
Spoke to [place].Dr.Jordan advised patient can take Metoprolol 12.5 mg daily.May take extra 12.5 mg if heart rate greater than 120.Hold if heart rate less than 60 or B/P less than 100.Faxed to [facility] at fax # [phone].

## 2018-02-26 NOTE — Telephone Encounter (Signed)
Ok to stay on 12.5 mg of metoprolol if she feels better on this. Can take prn HR > 120.   Jayro Mcmath [place] MD, [facility]

## 2018-02-26 NOTE — Telephone Encounter (Signed)
New Message   STAT if HR is under 50 or over 120 (normal HR is 60-100 beats per minute)  1) What is your heart rate? 132  2) Do you have a log of your heart rate readings (document readings)?   3) Do you have any other symptoms? None  Bernadette from [facility] is calling in reference to patients heart rate being elevated. Please call to discuss.

## 2018-02-26 NOTE — Telephone Encounter (Signed)
Returned the call to [place] at [employer]. She stated that the patient had weaned herself off of the Metoprolol and had only taken a 1/4 tablet bid over the weekend. Today was the first day that she had been off of the Metoprolol. Her blood pressure was 118/62 and heart rate was 132. The nurse told the patient to take half a tablet (12.5 mg) and at the recheck her heart rate was 95.  Mliss Sax would like to have an order for the patient to take Metoprolol 12.5 mg daily and then have 12.5 mg prn with specific parameters for the heart rate. If this is possible then the new order can be faxed to [phone] attention [facility].

## 2018-03-01 ENCOUNTER — Telehealth: Payer: Self-pay | Admitting: Cardiology

## 2018-03-01 NOTE — Telephone Encounter (Signed)
New Message   STAT if HR is under 50 or over 120 (normal HR is 60-100 beats per minute)  1) What is your heart rate?  89  Do you have a log of your heart rate readings (document readings)? 139 2) Do you have any other symptoms? No   Patient states that when she woke up this morning the HR was 139 and then she took her medicine it went down to 89. She says when she wakes up the HR is real high.

## 2018-03-01 NOTE — Telephone Encounter (Signed)
Returned call to patient.She stated she would like appointment with Dr.Jordan.Stated she is taking metoprolol 25 mg 1/2 tablet every morning.Stated when she wakes up in mornings before taking metoprolol B/P 128/78 pulse 137,120/60 pulse 138,129/76 pulse 139 after taking metoprolol B/P 112/59 pulse 95,108/62 pulse 89.Stated she still feels bad no energy.Advised I will speak to [place] tomorrow and call you back.

## 2018-03-02 ENCOUNTER — Encounter: Payer: Self-pay | Admitting: Cardiology

## 2018-03-02 NOTE — Telephone Encounter (Signed)
Returned call to patient.Dr.Jordan advised to schedule appointment.Advised to continue to monitor pulse and B/P and bring list of readings to appointment.Appointment scheduled with Dr.Jordan 03/12/18 at 1:40 pm.

## 2018-03-07 ENCOUNTER — Ambulatory Visit (HOSPITAL_COMMUNITY)
Admission: RE | Admit: 2018-03-07 | Discharge: 2018-03-07 | Disposition: A | Discharge status: Home / Self Care | Payer: Medicare Other | Source: Ambulatory Visit | Attending: Urology | Admitting: Urology

## 2018-03-07 DIAGNOSIS — I2510 Atherosclerotic heart disease of native coronary artery without angina pectoris: Secondary | ICD-10-CM | POA: Insufficient documentation

## 2018-03-07 DIAGNOSIS — C3491 Malignant neoplasm of unspecified part of right bronchus or lung: Secondary | ICD-10-CM | POA: Diagnosis not present

## 2018-03-07 DIAGNOSIS — K449 Diaphragmatic hernia without obstruction or gangrene: Secondary | ICD-10-CM | POA: Insufficient documentation

## 2018-03-07 DIAGNOSIS — I700 Atherosclerosis of aorta: Secondary | ICD-10-CM | POA: Diagnosis not present

## 2018-03-07 MED ORDER — IOPAMIDOL (ISOVUE-300) INJECTION 61%
INTRAVENOUS | Status: AC
  Administered 2018-03-07: 75 mL via INTRAVENOUS
  Filled 2018-03-07: qty 75

## 2018-03-10 NOTE — Progress Notes (Signed)
Cardiology Office Note   Date:  03/12/2018   ID:  [name] French, DOB 1926-11-27, MRN [id number]  PCP:  [name] Pao, MD  Cardiologist:   [name] Lindenbaum Martinique, MD   Chief Complaint  Patient presents with  . Follow-up    Elevated HR.      History of Present Illness: [name] French is a 84 y.o. female is seen for follow up  of tachycardia. She has no known history of cardiac disease. She has a history of Lung CA receiving RT. Not felt to be a candidate for chemo or surgery. She was admitted overnight 12/25/17 for evaluation of atypical chest pain. CT negative for PE. There was a left pleural effusion. troponins and Ecg were unremarkable. Echo was normal. She was noted to have PACs and mention of a run of NSVT. She was started on metoprolol. Her chest pain was felt to be esophageal.  She was seen by GI 01/25/18 for evaluation of a abnormal colon finding on PET scan. Evaluation never completed.  It was noted that her HR was elevated to ? 150. She had stopped taking metoprolol 2 days prior. No Ecg recorded.   She was  started  on metoprolol at 12.5 mg bid. Initially seemed to do well without symptoms of tachycardia. Since her last visit multiple phone calls stating metoprolol made her feel very fatigued. "I can't put one foot in front of the other" Attempts at tapering dose resulted in increase in HR. Now taking 12.5 mg daily but by next am HR in the 130s. Feels fluttery and weak kneed until she takes metoprolol. Patient very anxious about this and appointment scheduled to assess.   Since her last visit she did have a cranial MRI showing no mets. Repeat CT chest showed a response to RT with reduced mass size. There was coronary calcification noted. Appetite is poor. Notes some SOB and coughs a little more.     Past Medical History:  Diagnosis Date  . Allergic rhinitis   . Anemia    during pregnancy  . Arthritis   . Breast cancer ([place]) 05/08/13   right upper inner, invasive mammary  .  Breast cancer, right breast ([place]) 04/16/2013   Underwent lumpectomy on 11/06/13. Path showed G1 ILC, 2.7 cm, neg margins, receptor+, Her2neg   . Diverticulosis   . 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 Hemorrhoids   . Hx of adenomatous colonic polyps 05/2002  . Lung cancer ([place])   . Lung cancer ([place]) 10/2017  . Meningioma ([place])   . Osteoporosis   . Pneumonia    walking pneumonia  . Rosacea   . 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 Use of letrozole ([place])    neoadjuvant antiestrogen therapy with letrozole 2.5 mg daily x 7 monhts    Past Surgical History:  Procedure Laterality Date  . BREAST BIOPSY Left 1960   lt br bx/benign  . BREAST EXCISIONAL BIOPSY Left   . BREAST LUMPECTOMY Right   . BREAST LUMPECTOMY WITH NEEDLE LOCALIZATION Bilateral 11/06/2013   Procedure: BREAST LUMPECTOMY WITH NEEDLE LOCALIZATION;  Surgeon: [name] Lasso, MD;  Location: [facility];  Service: General;  Laterality: Bilateral;  . COLONOSCOPY    . EYE SURGERY     both cataracts  . MOHS SURGERY Right    nose basal/squamous  . VIDEO BRONCHOSCOPY WITH ENDOBRONCHIAL NAVIGATION N/A 11/01/2017   Procedure: VIDEO BRONCHOSCOPY WITH ENDOBRONCHIAL NAVIGATION;  Surgeon: [name] Nakayama, MD;  Location: [facility];  Service: Thoracic;  Laterality: N/A;  . WRIST SURGERY  1990   lt     Current Outpatient Medications  Medication Sig Dispense Refill  . aspirin EC 81 MG tablet Take 81 mg daily by mouth.    . cetirizine (ZYRTEC) 10 MG tablet Take 10 mg daily by mouth.     . Cholecalciferol (VITAMIN D-3) 1000 UNITS CAPS Take 1,000 Units daily by mouth.     . doxylamine, Sleep, (UNISOM) 25 MG tablet Take 25 mg at bedtime by mouth.    Marland Kitchen ibuprofen (ADVIL,MOTRIN) 200 MG tablet Take 200 mg by mouth 2 (two) times daily as needed (PAIN).    Marland Kitchen metoprolol tartrate (LOPRESSOR) 25 MG tablet Take 1/2 tablet ( 12.5 mg ) daily may take extra 12.5 mg if heart rate greater than 120.Hold if B/P less than 100 or  pulse less than 60. 180 tablet 3  . naphazoline-pheniramine (NAPHCON-A) 0.025-0.3 % ophthalmic solution Place 1-2 drops 4 (four) times daily as needed into both eyes for eye irritation or allergies.    . naproxen sodium (ALEVE) 220 MG tablet Take 220 mg by mouth 2 (two) times daily as needed (ARTHRITIS PAIN).    Marland Kitchen Olopatadine HCl (PAZEO) 0.7 % SOLN Place 1-2 drops 3 (three) times daily as needed into both eyes (for allergy eyes).    . pravastatin (PRAVACHOL) 40 MG tablet Take 40 mg daily by mouth.     . Probiotic Product (ALIGN) 4 MG CAPS Take 4 mg daily by mouth.    . ranitidine (ZANTAC 75) 75 MG tablet Take 1 tablet (75 mg total) by mouth 2 (two) times daily. 60 tablet 5  . SUPER B COMPLEX & C TABS Take 1 tablet by mouth daily.  0  . zolpidem (AMBIEN) 5 MG tablet Take 5 mg at bedtime as needed by mouth (for sleep.).   2   Current Facility-Administered Medications  Medication Dose Route Frequency Provider Last Rate Last Dose  . 0.9 %  sodium chloride infusion  500 mL Intravenous Once [name] Artist, MD        Allergies:   Codeine; Hydrocodone; and Neosporin [neomycin-bacitracin zn-polymyx]    Social History:  The patient  reports that she quit smoking about 39 years ago. Her smoking use included cigarettes. She has a 12.50 pack-year smoking history. She has never used smokeless tobacco. She reports that she drinks about 2.4 oz of alcohol per week. She reports that she does not use drugs.   Family History:  The patient's family history includes Heart disease in her mother; Lung cancer in her sister; Prostate cancer in her father.    ROS:  Please see the history of present illness.   Otherwise, review of systems are positive for none.   All other systems are reviewed and negative.    PHYSICAL EXAM: VS:  BP 118/64   Pulse 96   Ht 5\' 5"  (1.651 m)   Wt 122 lb (55.3 kg)   BMI 20.30 kg/m  , BMI Body mass index is 20.3 kg/m. GENERAL:  Well appearing thin WF in NAD HEENT:  PERRL, EOMI,  sclera are clear. Oropharynx is clear. NECK:  No jugular venous distention, carotid upstroke brisk and symmetric, no bruits, no thyromegaly or adenopathy LUNGS:  Clear to auscultation bilaterally CHEST:  Unremarkable HEART:  RRR,  PMI not displaced or sustained,S1 and S2 within normal limits, no S3, no S4: no clicks, no rubs, no murmurs ABD:  Soft, nontender. BS +,  no masses or bruits. No hepatomegaly, no splenomegaly EXT:  2 + pulses throughout, no edema, no cyanosis no clubbing SKIN:  Warm and dry.  No rashes NEURO:  Alert and oriented x 3. Cranial nerves II through XII intact. PSYCH:  Cognitively intact  EKG:  EKG is not ordered today.   Recent Labs: 11/01/2017: ALT 12 02/06/2018: BUN 10; Creatinine, Ser 0.66; Hemoglobin 12.5; Platelets 215; Potassium 4.0; Sodium 131    Lipid Panel    Component Value Date/Time   CHOL 158 12/26/2017 0025   TRIG 167 (H) 12/26/2017 0025   HDL 51 12/26/2017 0025   CHOLHDL 3.1 12/26/2017 0025   VLDL 33 12/26/2017 0025   LDLCALC 74 12/26/2017 0025      Wt Readings from Last 3 Encounters:  03/12/18 122 lb (55.3 kg)  02/20/18 123 lb 12.8 oz (56.2 kg)  02/20/18 123 lb 12.8 oz (56.2 kg)      Other studies Reviewed: Additional studies/ records that were reviewed today include:   Echo: 12/26/17:Study Conclusions  - Left ventricle: The cavity size was normal. Systolic function was   normal. The estimated ejection fraction was in the range of 60%   to 65%. Wall motion was normal; there were no regional wall   motion abnormalities. Left ventricular diastolic function   parameters were normal. - Aortic valve: There was mild regurgitation. - Atrial septum: No defect or patent foramen ovale was identified.   CT ANGIOGRAPHY CHEST WITH CONTRAST  TECHNIQUE: Multidetector CT imaging of the chest was performed using the standard protocol during bolus administration of intravenous contrast. Multiplanar CT image reconstructions and MIPs  were obtained to evaluate the vascular anatomy.  CONTRAST:  69mL ISOVUE-370 IOPAMIDOL (ISOVUE-370) INJECTION 76%, 49mL ISOVUE-370 IOPAMIDOL (ISOVUE-370) INJECTION 76%  COMPARISON:  Chest CT 10/11/2017 and PET-CT 10/20/2017  FINDINGS: Cardiovascular: The heart is normal in size. No pericardial effusion. Stable aortic and coronary artery calcifications.  The pulmonary arterial tree is suboptimally opacified but no obvious large or central filling defects to suggest pulmonary embolism.  Mediastinum/Nodes: Stable small scattered mediastinal and hilar lymph nodes. Prevascular lymph node on image number 33 measures 6 mm hand precarinal lymph node on image number 33 measures 7 mm. The esophagus is grossly normal.  Lungs/Pleura: Lesion left infrahilar mass is smaller. It measures approximately 3.4 x 2.9 cm on image number 45. The previously measured 5.4 x 4.8 cm. Persistence endobronchial soft tissue density and mass effect on the lingula and lower lobe bronchi. Associated left lower lobe atelectasis and a persistent small to moderate size left pleural effusion.  14.5 x 10 mm right middle lobe nodular ground-glass opacity is new since the prior study and this most likely inflammatory. No worrisome pulmonary nodules to suggest pulmonary metastatic disease.  Upper Abdomen: No significant upper abdominal findings but very little of the upper abdomen is image.  Musculoskeletal: No breast masses, supraclavicular or axillary adenopathy. The thyroid gland appears normal.  The bony thorax is intact. No worrisome bone lesions or acute bony findings.  Review of the MIP images confirms the above findings.  IMPRESSION: 1. No CT findings for pulmonary embolism but suboptimal opacification of the pulmonary arterial tree. 2. Interval decrease in size of the left hilar/infrahilar lung mass. 3. Persistent left lower lobe atelectasis and small to moderate size left pleural  effusion. 4. New nodular air ground-glass opacity in the right middle lobe, likely inflammatory but attention on future scans is suggested. 5. Stable small mediastinal and hilar lymph nodes.  Aortic Atherosclerosis (  ICD10-I70.0) and Emphysema (ICD10-J43.9).   Electronically Signed   By: Marijo Sanes M.D.   On: 12/25/2017 12:56    ASSESSMENT AND PLAN:  1. Sinus tachycardia. Inappropriate. Patient had some rebound tachycardia after stopping metoprolol abruptly. HR still  high. She is symptomatic.  Echo was OK. not anemic. Will check TFTs. Will increase metoprolol to 25 mg in am and 12.5 mg in pm. She will need to find the optimal dose to controlled her tachycardia without untoward side effects.  2. Lung CA s/p RT. Some response noted on CT 3. HTN  4. Dyspnea and some chest pain. ? Related to lung CA and RT. She does have significant coronary Calcification noted on CT. Will arrange for stress Myoview.   Current medicines are reviewed at length with the patient today.  The patient does not have concerns regarding medicines.   Orders Placed This Encounter  Procedures  . TSH  . T4, free  . Myocardial Perfusion Imaging     Disposition:   FU TBD  Signed, [name] Burgener Martinique, MD  03/12/2018 1:59 PM    [facility] [address] [place], [place], [address] Phone [phone], Fax [phone]

## 2018-03-12 ENCOUNTER — Encounter: Payer: Self-pay | Admitting: Cardiology

## 2018-03-12 ENCOUNTER — Ambulatory Visit (INDEPENDENT_AMBULATORY_CARE_PROVIDER_SITE_OTHER): Payer: Medicare Other | Admitting: Cardiology

## 2018-03-12 VITALS — BP 118/64 | HR 96 | Ht 65.0 in | Wt 122.0 lb

## 2018-03-12 DIAGNOSIS — R0602 Shortness of breath: Secondary | ICD-10-CM | POA: Diagnosis not present

## 2018-03-12 DIAGNOSIS — R000 Tachycardia, unspecified: Secondary | ICD-10-CM | POA: Diagnosis not present

## 2018-03-12 DIAGNOSIS — R072 Precordial pain: Secondary | ICD-10-CM

## 2018-03-12 NOTE — Patient Instructions (Signed)
We will check your thyroid tests  We will schedule you for a nuclear stress test  Adjust your metoprolol dose to maximize control of your elevated heart rate without side effects

## 2018-03-13 LAB — TSH: TSH: 2.78 u[IU]/mL (ref 0.450–4.500)

## 2018-03-13 LAB — T4, FREE: Free T4: 1.13 ng/dL (ref 0.82–1.77)

## 2018-03-15 ENCOUNTER — Telehealth: Payer: Self-pay | Admitting: Urology

## 2018-03-15 DIAGNOSIS — C3432 Malignant neoplasm of lower lobe, left bronchus or lung: Secondary | ICD-10-CM

## 2018-03-15 DIAGNOSIS — C3491 Malignant neoplasm of unspecified part of right bronchus or lung: Secondary | ICD-10-CM

## 2018-03-15 NOTE — Telephone Encounter (Signed)
I spoke with the patient today to inform her of her recent CT chest results which show a good response to her recent radiotherapy.  We will plan to repeat a CT scan in 3 months and she will follow-up in the office thereafter to review results.  She knows to call at any time in the interim with any questions or concerns related to her radiotherapy.  She states her understanding and agreement with this plan.    Nicholos Johns, PA-C

## 2018-03-16 ENCOUNTER — Ambulatory Visit: Payer: Medicare Other | Admitting: Physician Assistant

## 2018-03-21 ENCOUNTER — Telehealth (HOSPITAL_COMMUNITY): Payer: Self-pay

## 2018-03-21 NOTE — Telephone Encounter (Signed)
Encounter complete. 

## 2018-03-23 ENCOUNTER — Ambulatory Visit (HOSPITAL_COMMUNITY)
Admission: RE | Admit: 2018-03-23 | Discharge: 2018-03-23 | Disposition: A | Discharge status: Home / Self Care | Payer: Medicare Other | Source: Ambulatory Visit | Attending: Cardiovascular Disease | Admitting: Cardiovascular Disease

## 2018-03-23 DIAGNOSIS — R072 Precordial pain: Secondary | ICD-10-CM | POA: Diagnosis not present

## 2018-03-23 DIAGNOSIS — R000 Tachycardia, unspecified: Secondary | ICD-10-CM | POA: Diagnosis not present

## 2018-03-23 DIAGNOSIS — R0602 Shortness of breath: Secondary | ICD-10-CM | POA: Diagnosis not present

## 2018-03-23 LAB — MYOCARDIAL PERFUSION IMAGING
CHL CUP NUCLEAR SRS: 6
CSEPEDS: 1 s
CSEPPHR: 125 {beats}/min
Estimated workload: 7 METS
Exercise duration (min): 5 min
LV dias vol: 56 mL (ref 46–106)
LVSYSVOL: 25 mL
MPHR: 130 {beats}/min
Percent HR: 95 %
RPE: 18
Rest HR: 101 {beats}/min
SDS: 1
SSS: 7
TID: 0.94

## 2018-03-23 MED ORDER — TECHNETIUM TC 99M TETROFOSMIN IV KIT
10.1000 | PACK | Freq: Once | INTRAVENOUS | Status: AC | PRN
  Administered 2018-03-23: 10.1 via INTRAVENOUS
  Filled 2018-03-23: qty 11

## 2018-03-23 MED ORDER — TECHNETIUM TC 99M TETROFOSMIN IV KIT
29.9000 | PACK | Freq: Once | INTRAVENOUS | Status: AC | PRN
  Administered 2018-03-23: 29.9 via INTRAVENOUS
  Filled 2018-03-23: qty 30

## 2018-04-02 DIAGNOSIS — M8580 Other specified disorders of bone density and structure, unspecified site: Secondary | ICD-10-CM | POA: Diagnosis not present

## 2018-04-02 DIAGNOSIS — R82998 Other abnormal findings in urine: Secondary | ICD-10-CM | POA: Diagnosis not present

## 2018-04-02 DIAGNOSIS — M859 Disorder of bone density and structure, unspecified: Secondary | ICD-10-CM | POA: Diagnosis not present

## 2018-04-02 DIAGNOSIS — E7800 Pure hypercholesterolemia, unspecified: Secondary | ICD-10-CM | POA: Diagnosis not present

## 2018-04-11 DIAGNOSIS — C7989 Secondary malignant neoplasm of other specified sites: Secondary | ICD-10-CM | POA: Diagnosis not present

## 2018-04-11 DIAGNOSIS — E7800 Pure hypercholesterolemia, unspecified: Secondary | ICD-10-CM | POA: Diagnosis not present

## 2018-04-11 DIAGNOSIS — M5416 Radiculopathy, lumbar region: Secondary | ICD-10-CM | POA: Diagnosis not present

## 2018-04-11 DIAGNOSIS — Z6820 Body mass index (BMI) 20.0-20.9, adult: Secondary | ICD-10-CM | POA: Diagnosis not present

## 2018-04-11 DIAGNOSIS — Z23 Encounter for immunization: Secondary | ICD-10-CM | POA: Diagnosis not present

## 2018-04-11 DIAGNOSIS — N183 Chronic kidney disease, stage 3 (moderate): Secondary | ICD-10-CM | POA: Diagnosis not present

## 2018-04-11 DIAGNOSIS — Z853 Personal history of malignant neoplasm of breast: Secondary | ICD-10-CM | POA: Diagnosis not present

## 2018-04-11 DIAGNOSIS — H269 Unspecified cataract: Secondary | ICD-10-CM | POA: Diagnosis not present

## 2018-04-11 DIAGNOSIS — M859 Disorder of bone density and structure, unspecified: Secondary | ICD-10-CM | POA: Diagnosis not present

## 2018-04-11 DIAGNOSIS — Z1389 Encounter for screening for other disorder: Secondary | ICD-10-CM | POA: Diagnosis not present

## 2018-04-11 DIAGNOSIS — Z0000 Encounter for general adult medical examination without abnormal findings: Secondary | ICD-10-CM | POA: Diagnosis not present

## 2018-04-11 DIAGNOSIS — C3432 Malignant neoplasm of lower lobe, left bronchus or lung: Secondary | ICD-10-CM | POA: Diagnosis not present

## 2018-04-20 ENCOUNTER — Encounter: Payer: Self-pay | Admitting: Gastroenterology

## 2018-04-20 DIAGNOSIS — Z1212 Encounter for screening for malignant neoplasm of rectum: Secondary | ICD-10-CM | POA: Diagnosis not present

## 2018-04-24 ENCOUNTER — Telehealth (INDEPENDENT_AMBULATORY_CARE_PROVIDER_SITE_OTHER): Payer: Self-pay | Admitting: Physical Medicine and Rehabilitation

## 2018-04-24 NOTE — Telephone Encounter (Signed)
Left message for patient to call back to schedule.  °

## 2018-04-24 NOTE — Telephone Encounter (Signed)
Ok, last one did not last as long so we will see how she does

## 2018-05-08 DIAGNOSIS — L0291 Cutaneous abscess, unspecified: Secondary | ICD-10-CM | POA: Diagnosis not present

## 2018-05-08 DIAGNOSIS — L720 Epidermal cyst: Secondary | ICD-10-CM | POA: Diagnosis not present

## 2018-05-09 ENCOUNTER — Encounter (INDEPENDENT_AMBULATORY_CARE_PROVIDER_SITE_OTHER): Payer: Self-pay | Admitting: Physical Medicine and Rehabilitation

## 2018-05-09 ENCOUNTER — Ambulatory Visit (INDEPENDENT_AMBULATORY_CARE_PROVIDER_SITE_OTHER): Payer: Medicare Other

## 2018-05-09 ENCOUNTER — Ambulatory Visit (INDEPENDENT_AMBULATORY_CARE_PROVIDER_SITE_OTHER): Payer: Medicare Other | Admitting: Physical Medicine and Rehabilitation

## 2018-05-09 DIAGNOSIS — M25552 Pain in left hip: Secondary | ICD-10-CM | POA: Diagnosis not present

## 2018-05-09 NOTE — Progress Notes (Signed)
 .  Numeric Pain Rating Scale and Functional Assessment Average Pain 8   In the last MONTH (on 0-10 scale) has pain interfered with the following?  1. General activity like being  able to carry out your everyday physical activities such as walking, climbing stairs, carrying groceries, or moving a chair?  Rating(3)   +Driver, -BT, -Dye Allergies.

## 2018-05-09 NOTE — Progress Notes (Signed)
[name] French - 84 y.o. female MRN [id number]  Date of birth: 1926-08-12  Office Visit Note: Visit Date: 05/09/2018 PCP: [place] Pao, MD Referred by: [place] Pao, MD  Subjective: No chief complaint on file.  HPI: [name] French is an 84 year old female who I originally saw in the early part of 2018 because she could no longer see Dr.Shawn Dalton-Bethea Sharol Given insurance difficulties.  She was being seen at the time with more evaluation of low back and hip pain.  When I saw her we felt like this was really related to her hip.  She has had consistent left hip and groin pain.  Injection in January 2018 gave her really a great deal of relief and was very diagnostic.  She came back in August of that year and repeated the injection once again she did extremely well up until around January 2019.  We repeated the injection once again in February 2019 with good relief of symptoms.  Unfortunately this one did not seem to last as long as she has had pain over the last several weeks in the left hip and groin.  She reports standing and sitting make the pain worse.  She reports resting and lying down can make it better.  She reports no new trauma or other issues.  She has had a complicated history of lung cancer as well as breast cancer.  The lung cancer is non-small cell lung cancer that was actually first noticed in the fall of last year after I had seen her in the office.  Her exam is consistent with left hip pathology today with pain with rotation.  We are going to repeat the injection today.  We discussed at length the fact that we do get times with the use joints will just flareup we will not get as much length out of the injection.  He could also be that the hip is worsening over time as well.  The injections have been fairly spaced out.  The next step would be to have her evaluated by Dr. Ninfa Linden in our office.  She does ask again a lot about her lumbar spine issues and she is having some back pain  in general.  Dr. Neomia Dear had really look better from that standpoint but we have had really good relief with her hip and groin pain with diagnostic injections.  We discussed at length today the nature of injections and how it can be diagnostic.  She is not having much in the way of radicular pain.  She has no numbness tingling or paresthesia.  She is been intolerant to opioid medications in the past including codeine and hydrocodone.  She does use some Aleve.   Review of Systems  Constitutional: Positive for malaise/fatigue. Negative for chills, fever and weight loss.  HENT: Negative for hearing loss and sinus pain.   Eyes: Negative for blurred vision, double vision and photophobia.  Respiratory: Negative for cough and shortness of breath.   Cardiovascular: Negative for chest pain, palpitations and leg swelling.  Gastrointestinal: Negative for abdominal pain, nausea and vomiting.  Genitourinary: Negative for flank pain.  Musculoskeletal: Positive for back pain and joint pain. Negative for myalgias.  Skin: Negative for itching and rash.  Neurological: Negative for tremors, focal weakness and weakness.  Endo/Heme/Allergies: Negative.   Psychiatric/Behavioral: Negative for depression.  All other systems reviewed and are negative.  Otherwise per HPI.  Assessment & Plan: Visit Diagnoses:  1. Pain in left hip  Plan: Findings:  Chronic history of a combination of low back and left hip pain.  Initial treatment and diagnosis at another 29 office was more related to her lumbar spine.  In January 2018 we did obtain x-rays of her lumbar spine and pelvis showing pretty severe left hip osteoarthritis.  She does have some degenerative changes and facet arthropathy of the lumbar spine but overall not very impressive especially for her age.  She has gone on to have injections of the left hip with good relief.  These were intra-articular injections with fluoroscopy.  The last injection  did not seem to help as long.  She is also had complicated history since that time a new diagnosis of primary left lower lung cancer which is non-small cell cancer.  She has been getting some treatment for this and has had multiple scans ruling out metastatic disease.  At this point we are going to repeat the injection today to get her some relief.  Again I would have her see Dr. Ninfa Linden potentially if this was not very long lived.  We spoke at length for greater than 30 minutes about the history and treatment of hip arthritis and the differentiation about that versus her spine.    Meds & Orders: No orders of the defined types were placed in this encounter.   Orders Placed This Encounter  Procedures  . Large Joint Inj: L hip joint  . XR C-ARM NO REPORT    Follow-up: Return if symptoms worsen or fail to improve.   Procedures: Large Joint Inj: L hip joint on 05/09/2018 10:36 AM Indications: diagnostic evaluation and pain Details: 22 G 3.5 in needle, fluoroscopy-guided anterior approach  Arthrogram: No  Medications: 80 mg triamcinolone acetonide 40 MG/ML; 3 mL bupivacaine 0.5 % Outcome: tolerated well, no immediate complications  There was excellent flow of contrast producing a partial arthrogram of the hip. The patient did have relief of symptoms during the anesthetic phase of the injection. Procedure, treatment alternatives, risks and benefits explained, specific risks discussed. Consent was given by the patient. Immediately prior to procedure a time out was called to verify the correct patient, procedure, equipment, support staff and site/side marked as required. Patient was prepped and draped in the usual sterile fashion.      No notes on file   Clinical History: No specialty comments available.   She reports that she quit smoking about 39 years ago. Her smoking use included cigarettes. She has a 12.50 pack-year smoking history. She has never used smokeless tobacco.  Recent Labs     12/25/17 0946  HGBA1C 5.6    Objective:  VS:  HT:    WT:   BMI:     BP:   HR: bpm  TEMP: ( )  RESP:  Physical Exam  Constitutional: She is oriented to person, place, and time. She appears well-developed and well-nourished. No distress.  HENT:  Head: Normocephalic and atraumatic.  Nose: Nose normal.  Mouth/Throat: Oropharynx is clear and moist.  Eyes: Pupils are equal, round, and reactive to light. Conjunctivae are normal.  Neck: Neck supple. No JVD present. No tracheal deviation present.  Cardiovascular: Regular rhythm and intact distal pulses.  Pulmonary/Chest: Effort normal. No respiratory distress.  Abdominal: She exhibits no distension. There is no guarding.  Musculoskeletal:  Painful range of motion of the left hip.  Right hip appears benign.  She has good distal strength.  She has some pain with extension of the lumbar spine.  Neurological: She is  alert and oriented to person, place, and time. She exhibits normal muscle tone. Coordination normal.  Skin: Skin is warm. No rash noted. No erythema.  Psychiatric: She has a normal mood and affect. Her behavior is normal.  Nursing note and vitals reviewed.   Ortho Exam Imaging: No results found.  Past Medical/Family/Surgical/Social History: Medications & Allergies reviewed per EMR, new medications updated. Patient Active Problem List   Diagnosis Date Noted  . Chest pain 12/25/2017  . Abnormal findings on radiological examination of gastrointestinal tract 11/16/2017  . Primary cancer of left lower lobe of lung ([facility]) 11/15/2017  . Non-small cell cancer of right lung ([facility]) 11/07/2017  . Lung mass 10/17/2017  . Breast cancer of upper-inner quadrant of right female breast ([place]) 06/10/2013  . Mass of breast, left 05/31/2013  . PERSONAL HISTORY OF COLONIC POLYPS 07/26/2010   Past Medical History:  Diagnosis Date  . Allergic rhinitis   . Anemia    during pregnancy  . Arthritis   . Breast cancer ([facility]) 05/08/13   right upper  inner, invasive mammary  . Breast cancer, right breast ([place]) 04/16/2013   Underwent lumpectomy on 11/06/13. Path showed G1 ILC, 2.7 cm, neg margins, receptor+, Her2neg   . Diverticulosis   . 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 Hemorrhoids   . Hx of adenomatous colonic polyps 05/2002  . Lung cancer ([place])   . Lung cancer ([place]) 10/2017  . Meningioma ([facility])   . Osteoporosis   . Pneumonia    walking pneumonia  . Rosacea   . 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 Use of letrozole ([place])    neoadjuvant antiestrogen therapy with letrozole 2.5 mg daily x 7 monhts   Family History  Problem Relation Age of Onset  . Heart disease Mother   . Prostate cancer Father   . Lung cancer Sister    Past Surgical History:  Procedure Laterality Date  . BREAST BIOPSY Left 1960   lt br bx/benign  . BREAST EXCISIONAL BIOPSY Left   . BREAST LUMPECTOMY Right   . BREAST LUMPECTOMY WITH NEEDLE LOCALIZATION Bilateral 11/06/2013   Procedure: BREAST LUMPECTOMY WITH NEEDLE LOCALIZATION;  Surgeon: [facility] Lasso, MD;  Location: [facility];  Service: General;  Laterality: Bilateral;  . COLONOSCOPY    . EYE SURGERY     both cataracts  . MOHS SURGERY Right    nose basal/squamous  . VIDEO BRONCHOSCOPY WITH ENDOBRONCHIAL NAVIGATION N/A 11/01/2017   Procedure: VIDEO BRONCHOSCOPY WITH ENDOBRONCHIAL NAVIGATION;  Surgeon: [name], MD;  Location: [facility];  Service: Thoracic;  Laterality: N/A;  . WRIST SURGERY  1990   lt   Social History   Occupational History  . Occupation: Retired  Tobacco Use  . Smoking status: Former Smoker    Packs/day: 0.50    Years: 25.00    Pack years: 12.50    Types: Cigarettes    Last attempt to quit: 12/19/1978    Years since quitting: 39.4  . Smokeless tobacco: Never Used  Substance and Sexual Activity  . Alcohol use: Yes    Alcohol/week: 2.4 oz    Types: 4 Glasses of wine per week  . Drug use: No  . Sexual activity: Not Currently    Comment:  menarche at age14, menopause in 32, HRT x 25, age at first live birth 78's, G74 P3

## 2018-05-09 NOTE — Patient Instructions (Signed)

## 2018-05-18 MED ORDER — BUPIVACAINE HCL 0.5 % IJ SOLN
3.0000 mL | INTRAMUSCULAR | Status: AC | PRN
  Administered 2018-05-09: 3 mL via INTRA_ARTICULAR

## 2018-05-18 MED ORDER — TRIAMCINOLONE ACETONIDE 40 MG/ML IJ SUSP
80.0000 mg | INTRAMUSCULAR | Status: AC | PRN
  Administered 2018-05-09: 80 mg via INTRA_ARTICULAR

## 2018-06-06 ENCOUNTER — Other Ambulatory Visit: Payer: Self-pay | Admitting: Cardiology

## 2018-06-06 NOTE — Telephone Encounter (Signed)
Rx request sent to pharmacy.  

## 2018-06-14 ENCOUNTER — Telehealth: Payer: Self-pay | Admitting: *Deleted

## 2018-06-14 ENCOUNTER — Other Ambulatory Visit: Payer: Self-pay | Admitting: *Deleted

## 2018-06-14 NOTE — Telephone Encounter (Signed)
CALLED PATIENT TO INFORM OF CT FOR 07-09-18- ARRIVAL TIME - 11:15 AM @ [facility], PT. TO HAVE WATER ONLY - 4 HRS. PRIOR TO TEST AND PT. TO HAVE FU WITH ASHLYN BRUNING ON 07-11-18, SPOKE WITH PATIENT AND SHE IS AWARE OF THESE APPTS.

## 2018-06-15 ENCOUNTER — Ambulatory Visit
Admission: RE | Admit: 2018-06-15 | Discharge: 2018-06-15 | Disposition: A | Discharge status: Home / Self Care | Payer: Medicare Other | Source: Ambulatory Visit | Attending: Urology | Admitting: Urology

## 2018-06-30 NOTE — Progress Notes (Signed)
Cardiology Office Note   Date:  07/02/2018   ID:  DAKIA SCHIFANO, DOB Jan 23, 1926, MRN [id number]  PCP:  Haywood Pao, MD  Cardiologist:   Kaylin Schellenberg [place], MD   Chief Complaint  Patient presents with  . Chest Pain  . Palpitations      History of Present Illness: [name] French is a 84 y.o. female is seen for follow up  of tachycardia. She has no known history of cardiac disease. She has a history of Lung CA receiving RT. Not felt to be a candidate for chemo or surgery. She was admitted overnight 12/25/17 for evaluation of atypical chest pain. CT negative for PE. There was a left pleural effusion. troponins and Ecg were unremarkable. Echo was normal. She was noted to have PACs and mention of a run of NSVT. She was started on metoprolol. Her chest pain was felt to be esophageal.  She was seen by GI 01/25/18 for evaluation of a abnormal colon finding on PET scan. Evaluation never completed.  It was noted that her HR was elevated to ? 150. She had stopped taking metoprolol 2 days prior. No Ecg recorded.   She was  started  on metoprolol at 12.5 mg bid. Initially seemed to do well without symptoms of tachycardia. Since her last visit multiple phone calls stating metoprolol made her feel very fatigued. "I can't put one foot in front of the other" Attempts at tapering dose resulted in increase in HR. Now taking 12.5 mg daily but by next am HR in the 130s. Feels fluttery and weak kneed until she takes metoprolol.   Since her last visit she did have a cranial MRI showing no mets. Repeat CT chest I January showed a response to RT with reduced mass size. There was coronary calcification noted. She did undergo a Myoview study in April due to chest pain and it was normal.   On follow up today she notes some chest pain. It is random and not associated with meals, activity. Begins in front and radiates to back and left arm. Gets up and walks and sometimes belches and pain goes away. Lasts 1-5 minutes.  She is taking metoprolol 12.5 mg bid and this is controlling HR better between 80-110 generally.      Past Medical History:  Diagnosis Date  . Allergic rhinitis   . Anemia    during pregnancy  . Arthritis   . Breast cancer ([place]) 05/08/13   right upper inner, invasive mammary  . Breast cancer, right breast ([place]) 04/16/2013   Underwent lumpectomy on 11/06/13. Path showed G1 ILC, 2.7 cm, neg margins, receptor+, Her2neg   . Diverticulosis   . 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 Hemorrhoids   . Hx of adenomatous colonic polyps 05/2002  . Lung cancer ([place])   . Lung cancer [place] 10/2017  . Meningioma ([place])   . Osteoporosis   . Pneumonia    walking pneumonia  . Rosacea   . 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 Use of letrozole ([place])    neoadjuvant antiestrogen therapy with letrozole 2.5 mg daily x 7 monhts    Past Surgical History:  Procedure Laterality Date  . BREAST BIOPSY Left 1960   lt br bx/benign  . BREAST EXCISIONAL BIOPSY Left   . BREAST LUMPECTOMY Right   . BREAST LUMPECTOMY WITH NEEDLE LOCALIZATION Bilateral 11/06/2013   Procedure: BREAST LUMPECTOMY WITH NEEDLE LOCALIZATION;  Surgeon: Haywood Lasso, MD;  Location: [facility]  [place];  Service: General;  Laterality: Bilateral;  . COLONOSCOPY    . EYE SURGERY     both cataracts  . MOHS SURGERY Right    nose basal/squamous  . VIDEO BRONCHOSCOPY WITH ENDOBRONCHIAL NAVIGATION N/A 11/01/2017   Procedure: VIDEO BRONCHOSCOPY WITH ENDOBRONCHIAL NAVIGATION;  Surgeon: [name], MD;  Location: [facility];  Service: Thoracic;  Laterality: N/A;  . WRIST SURGERY  1990   lt     Current Outpatient Medications  Medication Sig Dispense Refill  . metoprolol tartrate (LOPRESSOR) 25 MG tablet Take 12.5 mg by mouth 2 (two) times daily.    Marland Kitchen aspirin EC 81 MG tablet Take 81 mg daily by mouth.    . cetirizine (ZYRTEC) 10 MG tablet Take 10 mg daily by mouth.     . Cholecalciferol (VITAMIN D-3) 1000 UNITS CAPS  Take 1,000 Units daily by mouth.     . doxylamine, Sleep, (UNISOM) 25 MG tablet Take 25 mg at bedtime by mouth.    Marland Kitchen ibuprofen (ADVIL,MOTRIN) 200 MG tablet Take 200 mg by mouth 2 (two) times daily as needed (PAIN).    Marland Kitchen metoprolol tartrate (LOPRESSOR) 25 MG tablet Take 1/2 tablet ( 12.5 mg ) daily may take extra 12.5 mg if heart rate greater than 120.Hold if B/P less than 100 or pulse less than 60. 180 tablet 3  . metoprolol tartrate (LOPRESSOR) 25 MG tablet TAKE ONE TABLET TWICE DAILY 60 tablet 1  . naphazoline-pheniramine (NAPHCON-A) 0.025-0.3 % ophthalmic solution Place 1-2 drops 4 (four) times daily as needed into both eyes for eye irritation or allergies.    . naproxen sodium (ALEVE) 220 MG tablet Take 220 mg by mouth 2 (two) times daily as needed (ARTHRITIS PAIN).    Marland Kitchen nitroGLYCERIN (NITROSTAT) 0.4 MG SL tablet Place 1 tablet (0.4 mg total) under the tongue every 5 (five) minutes as needed for chest pain. 90 tablet 3  . Olopatadine HCl (PAZEO) 0.7 % SOLN Place 1-2 drops 3 (three) times daily as needed into both eyes (for allergy eyes).    . pravastatin (PRAVACHOL) 40 MG tablet Take 40 mg daily by mouth.     . Probiotic Product (ALIGN) 4 MG CAPS Take 4 mg daily by mouth.    . ranitidine (ZANTAC 75) 75 MG tablet Take 1 tablet (75 mg total) by mouth 2 (two) times daily. 60 tablet 5  . SUPER B COMPLEX & C TABS Take 1 tablet by mouth daily.  0  . zolpidem (AMBIEN) 5 MG tablet Take 5 mg at bedtime as needed by mouth (for sleep.).   2   Current Facility-Administered Medications  Medication Dose Route Frequency Provider Last Rate Last Dose  . 0.9 %  sodium chloride infusion  500 mL Intravenous Once Ladene Artist, MD        Allergies:   Codeine; Hydrocodone; and Neosporin [neomycin-bacitracin zn-polymyx]    Social History:  The patient  reports that she quit smoking about 39 years ago. Her smoking use included cigarettes. She has a 12.50 pack-year smoking history. She has never used smokeless  tobacco. She reports that she drinks about 2.4 oz of alcohol per week. She reports that she does not use drugs.   Family History:  The patient's family history includes Heart disease in her mother; Lung cancer in her sister; Prostate cancer in her father.    ROS:  Please see the history of present illness.   Otherwise, review of systems are positive for none.  All other systems are reviewed and negative.    PHYSICAL EXAM: VS:  BP 102/80   Pulse (!) 109   Ht 5\' 5"  (1.651 m)   Wt 127 lb 9.6 oz (57.9 kg)   BMI 21.23 kg/m  , BMI Body mass index is 21.23 kg/m. GENERAL:  Well appearing thin WF in NAD HEENT:  PERRL, EOMI, sclera are clear. Oropharynx is clear. NECK:  No jugular venous distention, carotid upstroke brisk and symmetric, no bruits, no thyromegaly or adenopathy LUNGS:  Reduced BS and dullness left base. CHEST:  Unremarkable HEART:  RRR,  PMI not displaced or sustained,S1 and S2 within normal limits, no S3, no S4: no clicks, no rubs, no murmurs ABD:  Soft, nontender. BS +, no masses or bruits. No hepatomegaly, no splenomegaly EXT:  2 + pulses throughout, no edema, no cyanosis no clubbing SKIN:  Warm and dry.  No rashes NEURO:  Alert and oriented x 3. Cranial nerves II through XII intact. PSYCH:  Cognitively intact    EKG:  EKG is  ordered today. NSR rate 109. Normal otherwise. I have personally reviewed and interpreted this study.    Recent Labs: 11/01/2017: ALT 12 02/06/2018: BUN 10; Creatinine, Ser 0.66; Hemoglobin 12.5; Platelets 215; Potassium 4.0; Sodium 131 03/12/2018: TSH 2.780    Lipid Panel    Component Value Date/Time   CHOL 158 12/26/2017 0025   TRIG 167 (H) 12/26/2017 0025   HDL 51 12/26/2017 0025   CHOLHDL 3.1 12/26/2017 0025   VLDL 33 12/26/2017 0025   LDLCALC 74 12/26/2017 0025    Dated 04/02/18: cholesterol 188, triglycerides 146, HDL 48, LDL 111. CBC and chemistries normal.  Wt Readings from Last 3 Encounters:  07/02/18 127 lb 9.6 oz (57.9 kg)    03/23/18 122 lb (55.3 kg)  03/12/18 122 lb (55.3 kg)      Other studies Reviewed: Additional studies/ records that were reviewed today include:   Echo: 12/26/17:Study Conclusions  - Left ventricle: The cavity size was normal. Systolic function was   normal. The estimated ejection fraction was in the range of 60%   to 65%. Wall motion was normal; there were no regional wall   motion abnormalities. Left ventricular diastolic function   parameters were normal. - Aortic valve: There was mild regurgitation. - Atrial septum: No defect or patent foramen ovale was identified.   CT ANGIOGRAPHY CHEST WITH CONTRAST  TECHNIQUE: Multidetector CT imaging of the chest was performed using the standard protocol during bolus administration of intravenous contrast. Multiplanar CT image reconstructions and MIPs were obtained to evaluate the vascular anatomy.  CONTRAST:  51mL ISOVUE-370 IOPAMIDOL (ISOVUE-370) INJECTION 76%, 57mL ISOVUE-370 IOPAMIDOL (ISOVUE-370) INJECTION 76%  COMPARISON:  Chest CT 10/11/2017 and PET-CT 10/20/2017  FINDINGS: Cardiovascular: The heart is normal in size. No pericardial effusion. Stable aortic and coronary artery calcifications.  The pulmonary arterial tree is suboptimally opacified but no obvious large or central filling defects to suggest pulmonary embolism.  Mediastinum/Nodes: Stable small scattered mediastinal and hilar lymph nodes. Prevascular lymph node on image number 33 measures 6 mm hand precarinal lymph node on image number 33 measures 7 mm. The esophagus is grossly normal.  Lungs/Pleura: Lesion left infrahilar mass is smaller. It measures approximately 3.4 x 2.9 cm on image number 45. The previously measured 5.4 x 4.8 cm. Persistence endobronchial soft tissue density and mass effect on the lingula and lower lobe bronchi. Associated left lower lobe atelectasis and a persistent small to moderate size left pleural effusion.  14.5  x 10 mm  right middle lobe nodular ground-glass opacity is new since the prior study and this most likely inflammatory. No worrisome pulmonary nodules to suggest pulmonary metastatic disease.  Upper Abdomen: No significant upper abdominal findings but very little of the upper abdomen is image.  Musculoskeletal: No breast masses, supraclavicular or axillary adenopathy. The thyroid gland appears normal.  The bony thorax is intact. No worrisome bone lesions or acute bony findings.  Review of the MIP images confirms the above findings.  IMPRESSION: 1. No CT findings for pulmonary embolism but suboptimal opacification of the pulmonary arterial tree. 2. Interval decrease in size of the left hilar/infrahilar lung mass. 3. Persistent left lower lobe atelectasis and small to moderate size left pleural effusion. 4. New nodular air ground-glass opacity in the right middle lobe, likely inflammatory but attention on future scans is suggested. 5. Stable small mediastinal and hilar lymph nodes.  Aortic Atherosclerosis (ICD10-I70.0) and Emphysema (ICD10-J43.9).   Electronically Signed   By: Marijo Sanes M.D.   On: 12/25/2017 12:56  Myoview 03/23/18: Study Highlights    Nuclear stress EF: 56%.  Clinically and electrically negative for ischemia  Normal perfusiion No ischemia or scar  This is a low risk study.       ASSESSMENT AND PLAN:  1. Sinus tachycardia. Inappropriate. Patient had some rebound tachycardia after stopping metoprolol abruptly. HR improved on metoprolol 12.5 mg bid. Symptoms are improved.   Echo was OK. not anemic.  2. Lung CA s/p RT. Follow up CT planned for next week. 3. HTN  4. Chest pain. ? Related to lung CA. Evidence of left effusion on exam.  She does have significant coronary Calcification noted on CT. Myoview study showed normal perfusion. I have given her a Rx for sl Ntg to try Prn. Will await results of CT next week.   Current medicines are reviewed at  length with the patient today.  The patient does not have concerns regarding medicines.   Orders Placed This Encounter  Procedures  . EKG 12-Lead     Disposition:   FU 4 months  Signed, Lena Fieldhouse Martin[place], MD  07/02/2018 1:34 PM    [facility] [address] [place], [place], [address] Phone [phone], Fax [phone]

## 2018-07-02 ENCOUNTER — Encounter: Payer: Self-pay | Admitting: Cardiology

## 2018-07-02 ENCOUNTER — Ambulatory Visit (INDEPENDENT_AMBULATORY_CARE_PROVIDER_SITE_OTHER): Payer: Medicare Other | Admitting: Cardiology

## 2018-07-02 VITALS — BP 102/80 | HR 109 | Ht 65.0 in | Wt 127.6 lb

## 2018-07-02 DIAGNOSIS — R000 Tachycardia, unspecified: Secondary | ICD-10-CM | POA: Diagnosis not present

## 2018-07-02 DIAGNOSIS — R072 Precordial pain: Secondary | ICD-10-CM | POA: Diagnosis not present

## 2018-07-02 MED ORDER — NITROGLYCERIN 0.4 MG SL SUBL: 0.4000 mg | SUBLINGUAL_TABLET | SUBLINGUAL | 3 refills | Status: DC | PRN

## 2018-07-02 NOTE — Patient Instructions (Addendum)
We can try nitroglycerin sublingual for chest pain - dissolve 1 tablet under tongue every 5 minutes as needed - max 3 doses  Continue your current therapy  We will await the results of your CT next week.   Your physician recommends that you schedule a follow-up appointment in 4 months with Dr. Martinique

## 2018-07-09 ENCOUNTER — Telehealth (INDEPENDENT_AMBULATORY_CARE_PROVIDER_SITE_OTHER): Payer: Self-pay | Admitting: Physical Medicine and Rehabilitation

## 2018-07-09 ENCOUNTER — Encounter (HOSPITAL_COMMUNITY): Payer: Self-pay

## 2018-07-09 ENCOUNTER — Ambulatory Visit (HOSPITAL_COMMUNITY)
Admission: RE | Admit: 2018-07-09 | Discharge: 2018-07-09 | Disposition: A | Discharge status: Home / Self Care | Payer: Medicare Other | Source: Ambulatory Visit | Attending: Urology | Admitting: Urology

## 2018-07-09 DIAGNOSIS — C3432 Malignant neoplasm of lower lobe, left bronchus or lung: Secondary | ICD-10-CM

## 2018-07-09 DIAGNOSIS — C3491 Malignant neoplasm of unspecified part of right bronchus or lung: Secondary | ICD-10-CM | POA: Diagnosis not present

## 2018-07-09 DIAGNOSIS — J90 Pleural effusion, not elsewhere classified: Secondary | ICD-10-CM | POA: Diagnosis not present

## 2018-07-09 DIAGNOSIS — J9819 Other pulmonary collapse: Secondary | ICD-10-CM | POA: Diagnosis not present

## 2018-07-09 DIAGNOSIS — R918 Other nonspecific abnormal finding of lung field: Secondary | ICD-10-CM | POA: Diagnosis not present

## 2018-07-09 DIAGNOSIS — C3490 Malignant neoplasm of unspecified part of unspecified bronchus or lung: Secondary | ICD-10-CM | POA: Diagnosis not present

## 2018-07-09 LAB — POCT I-STAT CREATININE: Creatinine, Ser: 0.7 mg/dL (ref 0.44–1.00)

## 2018-07-09 MED ORDER — IOHEXOL 300 MG/ML  SOLN
75.0000 mL | Freq: Once | INTRAMUSCULAR | Status: AC | PRN
  Administered 2018-07-09: 75 mL via INTRAVENOUS

## 2018-07-09 NOTE — Telephone Encounter (Signed)
Ok to repeat but I want to be seen Dr. Ninfa Linden for evaluaiton of her hip

## 2018-07-10 NOTE — Telephone Encounter (Signed)
Left message asking patient to call back to schedule.

## 2018-07-10 NOTE — Telephone Encounter (Signed)
Scheduled for 7/30 at 0915.

## 2018-07-11 ENCOUNTER — Encounter: Payer: Self-pay | Admitting: Urology

## 2018-07-11 ENCOUNTER — Ambulatory Visit
Admission: RE | Admit: 2018-07-11 | Discharge: 2018-07-11 | Disposition: A | Discharge status: Home / Self Care | Payer: Medicare Other | Source: Ambulatory Visit | Attending: Urology | Admitting: Urology

## 2018-07-11 VITALS — BP 124/69 | HR 105 | Temp 97.5°F | Resp 18 | Ht 65.0 in | Wt 127.8 lb

## 2018-07-11 DIAGNOSIS — Z7982 Long term (current) use of aspirin: Secondary | ICD-10-CM | POA: Insufficient documentation

## 2018-07-11 DIAGNOSIS — C3432 Malignant neoplasm of lower lobe, left bronchus or lung: Secondary | ICD-10-CM | POA: Diagnosis not present

## 2018-07-11 DIAGNOSIS — Z08 Encounter for follow-up examination after completed treatment for malignant neoplasm: Secondary | ICD-10-CM | POA: Diagnosis not present

## 2018-07-11 DIAGNOSIS — Z79899 Other long term (current) drug therapy: Secondary | ICD-10-CM | POA: Diagnosis not present

## 2018-07-11 DIAGNOSIS — D330 Benign neoplasm of brain, supratentorial: Secondary | ICD-10-CM | POA: Diagnosis not present

## 2018-07-11 DIAGNOSIS — D320 Benign neoplasm of cerebral meninges: Secondary | ICD-10-CM | POA: Diagnosis not present

## 2018-07-11 DIAGNOSIS — C3491 Malignant neoplasm of unspecified part of right bronchus or lung: Secondary | ICD-10-CM

## 2018-07-11 DIAGNOSIS — R000 Tachycardia, unspecified: Secondary | ICD-10-CM | POA: Diagnosis not present

## 2018-07-11 DIAGNOSIS — Z853 Personal history of malignant neoplasm of breast: Secondary | ICD-10-CM | POA: Insufficient documentation

## 2018-07-11 DIAGNOSIS — C342 Malignant neoplasm of middle lobe, bronchus or lung: Secondary | ICD-10-CM | POA: Insufficient documentation

## 2018-07-11 DIAGNOSIS — J90 Pleural effusion, not elsewhere classified: Secondary | ICD-10-CM | POA: Insufficient documentation

## 2018-07-11 NOTE — Progress Notes (Signed)
No action taken

## 2018-07-11 NOTE — Progress Notes (Signed)
[name]  Radiation Oncology         [phone] ________________________________  Name: DAINELLE HUN MRN: [id number]  Date: 07/11/2018  DOB: 07/09/1926  Post Treatment Note  CC: Tisovec, Fransico Him, MD  Marcy Panning, MD  Diagnosis:   84 y.o. woman with newly diagnosed synchronous NSCLC, adenocarcinoma with a 6 cm LLL mass extending into the left hilum (stage IIB, T3N0M0) and a 2.4 cm RML mass (stage IB T2N0).     Interval Since Last Radiation:  6 months  11/28/17 - 01/18/18:  66 Gy directed to the LLL and RML lung lesions in 33 fractions of 2 Gy each.  Narrative:  In summary, She has a remote history of breast cancer diagnosed and treated in 2014, an was initially seen in consult on 11/15/18 at the request of Dr. Lindi Adie for newly diagnosed non-small cell lung cancer and incidental finding of meningioma. She presented to her PCP in October 2018 with persistent dry cough that led to an isolated episode of hemoptysis. She denied further episodes of hemoptysis since that time. She presented to her PCP for evaluation and was being worked up for possible pneumonia with a chest x-ray and a CT scan of the chest. CT done on 10/11/2017 showed a 6.0 cm LLL lung mass with additional lung nodules, including a 2.4 cm pulmonary nodule in the RML as well as 0.3 cm satellite nodules. There was also a hypodense 0.4 cm RLL lesion. A PET CT scan on 112/18 demonstrated a hypermetabolic LLL mass extending into the left hilum with SUV of 26.8 (stage IIb, T3N0M0) and a mildly hypermetabolic RML mass with SUV max of 1.9 (stage IB T2N0).  There were no hypermetabolic hilar or mediastinal nodes.  She was evaluated with Dr. Roxan Hockey and underwent bilateral navigational bronchoscopy with biopsies of the lung masses on 11/01/17 which revealed bilateral non-small cell lung cancer, poorly differentiated adenocarcinoma.   An MRI brain was performed on 10/20/2017 for disease staging and incidentally showed a right inferior  parietal convexity meningioma measuring 2.9 x 2.7 x 1.4 cm. There was no evidence of metastatic disease. Her imaging was reviewed at the multidisciplinary brain conference and consensus recommendation was to repeat a scan in 3 months to assess for progression/enlargement. Repeat brain MRI was performed on 01/24/2018 demonstrating a persistent right temporoparietal meningioma unchanged in size with only mild vasogenic edema which appears improved from her previous study.  There was no evidence of metastatic disease to the brain.  This study was reviewed at the multidisciplinary brain conference on 01/29/2018 and consensus recommendation was to only repeat brain imaging if the patient develops symptoms.  The patient returns today for routine follow-up.  She has not received any systemic treatment thus far, as the feeling was that concurrent chemoradiation would be too toxic for the patient due to her age.  However, molecular studies were ordered to assess for a potential role of immunotherapy if she demonstrated PD1 expression greater than 50%.  Unfortunately, her molecular studies returned showing only a very low expression of PD 1 at 5%.  There were no other identifiable mutations noted.  She tolerated radiation treatment relatively well. She denied having any pain but did report mild dysphagia, a dry cough and sore throat, but no hoarseness noted. She reported having SOB with great exertion only and was hospitalized on 12/25/17 under observation for tachycardia, chest pain and increased SOB.  Prior to completing treatment the chest pain was resolved. She did use Carefate, Sonafine, and Radaiplex  as directed for symptom management. There was mild hyperpigmentation of the middle back without desquamation.                             On review of systems, the patient states that she is doing well overall. Currently, she denies increased shortness of breath, orthopnea, chest pain, dysphagia, fever, chills, cough or  hemoptysis.  She has baseline shortness of breath with exertion and fatigue which has remained unchanged since 10/2017.  She was recently started on metoprolol for tachycardia and since having the dose adjusted, she is tolerating this well with improved energy, appetite and "feeling human again".  She denies headaches, changes in visual or auditory acuity, difficulty with speech or word finding, imbalance, tinnitus, tremor or seizure activity.    ALLERGIES:  is allergic to codeine; hydrocodone; and neosporin [neomycin-bacitracin zn-polymyx].  Meds: Current Outpatient Medications  Medication Sig Dispense Refill  . Artificial Tear Solution (BION TEARS OP) Apply 1-2 drops to eye as needed. Instills 1-2 drops in both eyes as needed    . aspirin EC 81 MG tablet Take 81 mg daily by mouth.    . cetirizine (ZYRTEC) 10 MG tablet Take 10 mg daily by mouth.     . Cholecalciferol (VITAMIN D-3) 1000 UNITS CAPS Take 1,000 Units daily by mouth.     . doxylamine, Sleep, (UNISOM) 25 MG tablet Take 25 mg at bedtime by mouth.    Marland Kitchen ibuprofen (ADVIL,MOTRIN) 200 MG tablet Take 200 mg by mouth 2 (two) times daily as needed (PAIN).    Marland Kitchen metoprolol tartrate (LOPRESSOR) 25 MG tablet Take 12.5 mg by mouth 2 (two) times daily.    . naphazoline-pheniramine (NAPHCON-A) 0.025-0.3 % ophthalmic solution Place 1-2 drops 4 (four) times daily as needed into both eyes for eye irritation or allergies.    . naproxen sodium (ALEVE) 220 MG tablet Take 220 mg by mouth 2 (two) times daily as needed (ARTHRITIS PAIN).    Marland Kitchen pravastatin (PRAVACHOL) 40 MG tablet Take 40 mg daily by mouth.     . nitroGLYCERIN (NITROSTAT) 0.4 MG SL tablet Place 1 tablet (0.4 mg total) under the tongue every 5 (five) minutes as needed for chest pain. (Patient not taking: Reported on 07/11/2018) 90 tablet 3  . Olopatadine HCl (PAZEO) 0.7 % SOLN Place 1-2 drops 3 (three) times daily as needed into both eyes (for allergy eyes).    . Probiotic Product (ALIGN) 4 MG CAPS  Take 4 mg daily by mouth.    . ranitidine (ZANTAC 75) 75 MG tablet Take 1 tablet (75 mg total) by mouth 2 (two) times daily. 60 tablet 5  . SUPER B COMPLEX & C TABS Take 1 tablet by mouth daily.  0  . zolpidem (AMBIEN) 5 MG tablet Take 5 mg at bedtime as needed by mouth (for sleep.).   2   Current Facility-Administered Medications  Medication Dose Route Frequency Provider Last Rate Last Dose  . 0.9 %  sodium chloride infusion  500 mL Intravenous Once Ladene Artist, MD        Physical Findings:  height is 5\' 5"  (1.651 m) and weight is 127 lb 12.8 oz (58 kg). Her oral temperature is 97.5 F (36.4 C) (abnormal). Her blood pressure is 124/69 and her pulse is 105 (abnormal). Her respiration is 18 and oxygen saturation is 95%.  Pain Assessment Pain Score: 6  Pain Loc: Leg(Left leg)/10 In general this is a well  appearing Caucasian female in no acute distress.  She's alert and oriented x4 and appropriate throughout the examination. Cardiopulmonary assessment is negative for acute distress and she exhibits normal effort.  Lungs are clear to auscultation bilaterally with mild expiratory wheezes bilaterally and decreased breath sounds in the LLL.  There is dullness to percussion in the LLL.  EOMs intact bilaterally and PERRLA.  She appears grossly neurologically intact.  Lab Findings: Lab Results  Component Value Date   WBC 6.4 02/06/2018   HGB 12.5 02/06/2018   HCT 37.4 02/06/2018   MCV 81.5 02/06/2018   PLT 215 02/06/2018     Radiographic Findings: Ct Chest W Contrast  Result Date: 07/09/2018 CLINICAL DATA:  Non-small-cell lung cancer. EXAM: CT CHEST WITH CONTRAST TECHNIQUE: Multidetector CT imaging of the chest was performed during intravenous contrast administration. CONTRAST:  36mL OMNIPAQUE IOHEXOL 300 MG/ML  SOLN COMPARISON:  03/07/2018 FINDINGS: Cardiovascular: The heart size is normal. No substantial pericardial effusion. Coronary artery calcification is evident. Atherosclerotic  calcification is noted in the wall of the thoracic aorta. Mediastinum/Nodes: No mediastinal lymphadenopathy. Interval progression of left lower lobe central collapse/consolidation. Abnormal soft tissue in the left hilum obscures assessment for lymphadenopathy. No right hilar adenopathy. No axillary adenopathy. The esophagus has normal imaging features. Lungs/Pleura: The central tracheobronchial airways are patent. Stable right middle lobe scarring. 5 mm right subpleural nodule (5:79) slightly progressed in the interval. Some interval clearing an airspace consolidation seen previously in the lingula. Interval progression of collapse/consolidation in the left lower lobe. Progression of left pleural effusion. Upper Abdomen: Stable 11 mm left adrenal nodule. Musculoskeletal: No worrisome lytic or sclerotic osseous abnormality. IMPRESSION: 1. Evolution of collapse/consolidation in the central left lung may reflect response to therapy although the collapse/consolidation has progressed in the left lower lobe. Mass lesion measured in the left infrahilar region on the prior study is obscured by the worsening collapse on today's exam. Disease progression cannot be excluded. 2. Interval progression of moderate left pleural effusion. Electronically Signed   By: Misty Stanley M.D.   On: 07/09/2018 14:40    Impression/Plan: 1. 84 y.o. woman with synchronous NSCLC, adenocarcinoma with a 6 cm LLL mass extending into the left hilum (stage IIB, T3N0M0) and a 2.4 cm RML mass (stage IB T2N0).  Clinically she remains stable although radiographically, there is question of disease progression versus treatment changes.  Her most recent CT Chest from 07/09/18 shows stable right middle lobe scarring but evolution of collapse/consolidation in the central left lung which may reflect response to therapy although the collapse/consolidation has progressed in the left lower lobe. The mass lesion measured in the left infrahilar region on the  prior study is obscured by the worsening collapse on today's exam. There is also interval progression of a moderate left pleural effusion though patient denies any progressive shortness of breath or cough. We will plan a follow up Chest CT in 2 months to further assess the LLL collapse/consolidation.  She knows to call immediately for any progressive SOB or concerning symptoms in the interval. I will also reach out to pulmonology for further assessment and opinion regarding management of the left pleural effusion.  She is reluctant to pursue thoracentesis unless felt absolutely necessary since she remains relatively asymptomatic.  We discussed that should the fluid continue to accumulate at the time of next imaging, we would need to move forward with thoracentesis for therapeutic and diagnostic purposes.  She is in agreement with the stated plan.  2.  Meningioma. This was an incidental finding and patient is currently without neurologic or systemic complaints.  It does not appear to be causing a mass effect on any structures in the brain and remains stable on her most recent follow-up MRI brain from 01/24/2018. The consensus at brain conference from 01/29/2018 is to only repeat brain imaging if she becomes symptomatic.  Otherwise we will just follow this expectantly and only repeat further brain imaging should she develop concerning symptoms. She is in agreement with and is comfortable with this plan.  3. Tachycardia. Her heart rate is improved, at 105, today in the office on low dose Metoprolol.  She did not tolerate the side effects of full dose metoprolol well at all but seems to be tolerating her current low dose fairly well.  This is being managed by her cardiologist, Dr. Peter Martinique.  I will inform Dr. Martinique of recent findings of progressive pleural effusion on recent CT to see if he feels this is at all cardiac related.     Nicholos Johns, PA-C

## 2018-07-11 NOTE — Progress Notes (Addendum)
[name] French 84 y.o. man with newly diagnosed synchronous NSCLC, adenocarcinoma with a 6 cm LLL mass extending into the left hilum (stage IIB, T3N0M0) and a 2.4 cm RML mass (stage IB T2N0 )completed radiation 01-18-18 review  07-09-18 CT chest with contrast  FU.   Weight changes, if any: Wt Readings from Last 3 Encounters:  07/11/18 127 lb 12.8 oz (58 kg)  07/02/18 127 lb 9.6 oz (57.9 kg)  03/23/18 122 lb (55.3 kg)   Respiratory complaints, if any: SOB with exertion, dry cough at times denies wheezing Swallowing Problems/Pain/Difficulty swallowing:No she is eating and drinking slower if she eats too fast she does have a problem swallowing. Appetite :Good Pain:6/10 left leg taking Aleve BP 124/69 (BP Location: Left Arm, Patient Position: Sitting, Cuff Size: Normal)   Pulse (!) 105   Temp (!) 97.5 F (36.4 C) (Oral)   Resp 18   Ht 5\' 5"  (1.651 m)   Wt 127 lb 12.8 oz (58 kg)   SpO2 95%   BMI 21.27 kg/m

## 2018-07-17 ENCOUNTER — Telehealth: Payer: Self-pay | Admitting: Urology

## 2018-07-17 ENCOUNTER — Ambulatory Visit (INDEPENDENT_AMBULATORY_CARE_PROVIDER_SITE_OTHER): Payer: Self-pay

## 2018-07-17 ENCOUNTER — Ambulatory Visit (INDEPENDENT_AMBULATORY_CARE_PROVIDER_SITE_OTHER): Payer: Medicare Other | Admitting: Physical Medicine and Rehabilitation

## 2018-07-17 ENCOUNTER — Ambulatory Visit (INDEPENDENT_AMBULATORY_CARE_PROVIDER_SITE_OTHER): Payer: Medicare Other

## 2018-07-17 ENCOUNTER — Encounter (INDEPENDENT_AMBULATORY_CARE_PROVIDER_SITE_OTHER): Payer: Self-pay | Admitting: Physical Medicine and Rehabilitation

## 2018-07-17 DIAGNOSIS — M25552 Pain in left hip: Secondary | ICD-10-CM | POA: Diagnosis not present

## 2018-07-17 DIAGNOSIS — M25562 Pain in left knee: Secondary | ICD-10-CM

## 2018-07-17 NOTE — Progress Notes (Addendum)
[name] French - 84 y.o. female MRN [id number]  Date of birth: [birth year]  Office Visit Note: Visit Date: 07/17/2018 PCP: Haywood Pao, MD Referred by: Haywood Pao, MD  Subjective: Chief Complaint  Patient presents with  . Left Hip - Pain  . Left Knee - Pain   HPI: [name] French is a very pleasant 84 year old female that I last saw and May of this year and completed intra-articular injection of the left hip.  She reports 60% relief for 2 weeks.  Unfortunately the injections seem to have gotten to the point where they are not lasting very long.  I initially saw her in January 2018 for what was felt to be back and hip pain related to facet arthropathy and she had injections by Dr. Niel Hummer that really were just not helping very much.  She could not see her anymore [employer] issues and we saw her in evaluation and she had pretty acute findings of left hip pathology and pain.  Initial injection in 2018 gave her many months of relief.  She had 2 injections both lasting more than 6 months with pretty darn good relief.  In February of this year we repeated the injection and she did get good relief up until May.  Reviewing the images from May show fairly well-placed injection.  She is also complaining a lot about her knees.  She complains of pain in the medial aspect of the left knee.  She denies any locking or clicking or popping.  She does feel some grinding at times that she moves her leg back and forth.  She has had no instability or falls or trauma.  No swelling.  She reports her pain is a 10 out of 10 with difficulty ambulating.  It does affect her daily activities a great deal.  She has not noted any radicular pain down the legs with paresthesias or tingling or numbness.  She is had no focal weakness.  She has not had x-rays completed of her knees.  We did complete that today and this is dictated below.   Review of Systems  Constitutional: Negative for chills, fever,  malaise/fatigue and weight loss.  HENT: Negative for hearing loss and sinus pain.   Eyes: Negative for blurred vision, double vision and photophobia.  Respiratory: Negative for cough and shortness of breath.   Cardiovascular: Negative for chest pain, palpitations and leg swelling.  Gastrointestinal: Negative for abdominal pain, nausea and vomiting.  Genitourinary: Negative for flank pain.  Musculoskeletal: Positive for back pain and joint pain. Negative for myalgias.  Skin: Negative for itching and rash.  Neurological: Negative for tremors, focal weakness and weakness.  Endo/Heme/Allergies: Negative.   Psychiatric/Behavioral: Negative for depression.  All other systems reviewed and are negative.  Otherwise per HPI.  Assessment & Plan: Visit Diagnoses:  1. Left knee pain, unspecified chronicity   2. Pain in left hip     Plan: Findings:  1.  Chronic worsening severe left hip pain which I think is also referring pain to the knee.  She has pretty significant osteoarthritis of the left hip and pain with range of motion of the hip with stiffness and lack of range of motion.  She ambulates without aid with antalgic gait but otherwise is doing fairly well in terms of getting along at her age of 51.  We are going to complete a repeat injection of the left hip today from a diagnostic and hopefully therapeutic standpoint and also to determine how  much of this knee pain is related to the hip.  I want her to see Dr. Jean Rosenthal in our office for evaluation of her hip.  She still may be a good candidate for hip replacement but that something he would talk to her about.  She is open to the idea but obviously is not keen on surgery at this stage of her life but it is something she would consider talking about.  He can also look at her knees once again but I feel like her knees are actually pretty good at this point.  2.  Left knee pain which has been severe lately does not seem to be related to the  knee itself I think is referral pain.  X-rays really unrevealing without much in the way of osteoarthritis of the knees.  Knees are stable and there is been no effusion or swelling.  Again I would like Dr. Ninfa Linden to evaluate that as well when he sees her.    Meds & Orders:  Meds ordered this encounter  Medications  . bupivacaine (MARCAINE) 0.5 % (with pres) injection 3 mL  . triamcinolone acetonide (KENALOG-40) injection 80 mg    Orders Placed This Encounter  Procedures  . Large Joint Inj: L hip joint  . XR Knee 1-2 Views Left  . XR C-ARM NO REPORT    Follow-up: Return for Consult Dr. Ninfa Linden.   Procedures: Large Joint Inj: L hip joint on 07/17/2018 9:48 AM Indications: diagnostic evaluation and pain Details: 22 G 3.5 in needle, fluoroscopy-guided anterior approach  Arthrogram: No  Medications: 3 mL bupivacaine 0.5 %; 80 mg triamcinolone acetonide 40 MG/ML Outcome: tolerated well, no immediate complications  There was excellent flow of contrast producing a partial arthrogram of the hip. The patient did have relief of symptoms during the anesthetic phase of the injection. Procedure, treatment alternatives, risks and benefits explained, specific risks discussed. Consent was given by the patient. Immediately prior to procedure a time out was called to verify the correct patient, procedure, equipment, support staff and site/side marked as required. Patient was prepped and draped in the usual sterile fashion.      No notes on file   Clinical History: No specialty comments available.   She reports that she quit smoking about 39 years ago. Her smoking use included cigarettes. She has a 12.50 pack-year smoking history. She has never used smokeless tobacco.  Recent Labs    12/25/17 0946  HGBA1C 5.6    Objective:  VS:  HT:    WT:   BMI:     BP:   HR: bpm  TEMP: ( )  RESP:  Physical Exam  Constitutional: She is oriented to person, place, and time. She appears well-developed  and well-nourished.  Eyes: Pupils are equal, round, and reactive to light. Conjunctivae and EOM are normal.  Cardiovascular: Normal rate and intact distal pulses.  Pulmonary/Chest: Effort normal.  Musculoskeletal:  Patient has difficulty going from sit to stand but does ambulate without aid with antalgic gait to the left.  She has painful range of motion of the left hip concordant left hip pain in the groin.  Examination of both knees show good varus and valgus stability with no swelling.  There is no joint line tenderness.  There is no tenderness over the bursa.  There is some mild crepitus with movement of the left knee compared to right.  Neurological: She is alert and oriented to person, place, and time. She exhibits normal muscle tone.  Skin: Skin is warm and dry. No rash noted. No erythema.  Psychiatric: She has a normal mood and affect. Her behavior is normal.  Nursing note and vitals reviewed.   Ortho Exam Imaging: No results found.  Past Medical/Family/Surgical/Social History: Medications & Allergies reviewed per EMR, new medications updated. Patient Active Problem List   Diagnosis Date Noted  . Pleural effusion on left 07/11/2018  . Chest pain 12/25/2017  . Abnormal findings on radiological examination of gastrointestinal tract 11/16/2017  . Primary cancer of left lower lobe of lung ([place]) 11/15/2017  . Non-small cell cancer of right lung ([place]) 11/07/2017  . Lung mass 10/17/2017  . Breast cancer of upper-inner quadrant of right female breast ([place]) 06/10/2013  . Mass of breast, left 05/31/2013  . PERSONAL HISTORY OF COLONIC POLYPS 07/26/2010   Past Medical History:  Diagnosis Date  . Allergic rhinitis   . Anemia    during pregnancy  . Arthritis   . Breast cancer ([place]) 05/08/13   right upper inner, invasive mammary  . Breast cancer, right breast ([place]) 04/16/2013   Underwent lumpectomy on 11/06/13. Path showed G1 ILC, 2.7 cm, neg margins, receptor+, Her2neg   .  Diverticulosis   . 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 Hemorrhoids   . Hx of adenomatous colonic polyps 05/2002  . Lung cancer ([place])   . Lung cancer ([place]) 10/2017  . Meningioma ([place])   . Osteoporosis   . Pneumonia    walking pneumonia  . Rosacea   . 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 Use of letrozole ([place])    neoadjuvant antiestrogen therapy with letrozole 2.5 mg daily x 7 monhts   Family History  Problem Relation Age of Onset  . Heart disease Mother   . Prostate cancer Father   . Lung cancer Sister    Past Surgical History:  Procedure Laterality Date  . BREAST BIOPSY Left 1960   lt br bx/benign  . BREAST EXCISIONAL BIOPSY Left   . BREAST LUMPECTOMY Right   . BREAST LUMPECTOMY WITH NEEDLE LOCALIZATION Bilateral 11/06/2013   Procedure: BREAST LUMPECTOMY WITH NEEDLE LOCALIZATION;  Surgeon: Haywood Lasso, MD;  Location: [facility];  Service: General;  Laterality: Bilateral;  . COLONOSCOPY    . EYE SURGERY     both cataracts  . MOHS SURGERY Right    nose basal/squamous  . VIDEO BRONCHOSCOPY WITH ENDOBRONCHIAL NAVIGATION N/A 11/01/2017   Procedure: VIDEO BRONCHOSCOPY WITH ENDOBRONCHIAL NAVIGATION;  Surgeon: [name], MD;  Location: [facility];  Service: Thoracic;  Laterality: N/A;  . WRIST SURGERY  1990   lt   Social History   Occupational History  . Occupation: Retired  Tobacco Use  . Smoking status: Former Smoker    Packs/day: 0.50    Years: 25.00    Pack years: 12.50    Types: Cigarettes    Last attempt to quit: 12/19/1978    Years since quitting: 39.6  . Smokeless tobacco: Never Used  Substance and Sexual Activity  . Alcohol use: Yes    Alcohol/week: 2.4 oz    Types: 4 Glasses of wine per week  . Drug use: No  . Sexual activity: Not Currently    Comment: menarche at age14, menopause in 21, HRT x 54, age at first live birth 59's, G15 P3

## 2018-07-17 NOTE — Telephone Encounter (Signed)
Patient informed that after communicating with Dr. Baltazar Apo, who has reviewed her recent CT imaging, the recommendation is to proceed with repeat CT Chest in 2-3 months for further assessment of pleural effusion since she remains relatively asymptomatic at this time.  Confirmed today that she is not having progressive shortness of breath on exertion since I saw her last and no chest pain or increased cough.  She feels well in general and is grateful for this news.  Advised that we will coordinate a repeat CT chest in early October 2019 and a follow-up visit shortly thereafter to review results and further recommendations at that time.  She is comfortable with and in agreement with this plan.  She knows to call with any questions or concerns in the interim.   Nicholos Johns, PA-C

## 2018-07-17 NOTE — Progress Notes (Signed)
  Numeric Pain Rating Scale and Functional Assessment Average Pain 10   In the last MONTH (on 0-10 scale) has pain interfered with the following?  1. General activity like being  able to carry out your everyday physical activities such as walking, climbing stairs, carrying groceries, or moving a chair?  Rating(10)    -Dye Allergies.

## 2018-07-17 NOTE — Patient Instructions (Signed)

## 2018-07-24 ENCOUNTER — Encounter (INDEPENDENT_AMBULATORY_CARE_PROVIDER_SITE_OTHER): Payer: Self-pay | Admitting: Physical Medicine and Rehabilitation

## 2018-07-24 DIAGNOSIS — L821 Other seborrheic keratosis: Secondary | ICD-10-CM | POA: Diagnosis not present

## 2018-07-24 DIAGNOSIS — D1801 Hemangioma of skin and subcutaneous tissue: Secondary | ICD-10-CM | POA: Diagnosis not present

## 2018-07-24 DIAGNOSIS — Z85828 Personal history of other malignant neoplasm of skin: Secondary | ICD-10-CM | POA: Diagnosis not present

## 2018-07-24 MED ORDER — TRIAMCINOLONE ACETONIDE 40 MG/ML IJ SUSP
80.0000 mg | INTRAMUSCULAR | Status: AC | PRN
  Administered 2018-07-17: 80 mg via INTRA_ARTICULAR

## 2018-07-24 MED ORDER — BUPIVACAINE HCL 0.5 % IJ SOLN
3.0000 mL | INTRAMUSCULAR | Status: AC | PRN
  Administered 2018-07-17: 3 mL via INTRA_ARTICULAR

## 2018-08-06 ENCOUNTER — Encounter (INDEPENDENT_AMBULATORY_CARE_PROVIDER_SITE_OTHER): Payer: Self-pay | Admitting: Orthopaedic Surgery

## 2018-08-06 ENCOUNTER — Ambulatory Visit (INDEPENDENT_AMBULATORY_CARE_PROVIDER_SITE_OTHER): Payer: Medicare Other | Admitting: Orthopaedic Surgery

## 2018-08-06 ENCOUNTER — Ambulatory Visit (INDEPENDENT_AMBULATORY_CARE_PROVIDER_SITE_OTHER): Payer: Medicare Other

## 2018-08-06 DIAGNOSIS — M1612 Unilateral primary osteoarthritis, left hip: Secondary | ICD-10-CM | POA: Diagnosis not present

## 2018-08-06 DIAGNOSIS — M1712 Unilateral primary osteoarthritis, left knee: Secondary | ICD-10-CM

## 2018-08-06 DIAGNOSIS — M25552 Pain in left hip: Secondary | ICD-10-CM

## 2018-08-06 NOTE — Progress Notes (Signed)
Office Visit Note   Patient: [name] French           Date of Birth: 11-16-1926           MRN: [id number] Visit Date: 08/06/2018              Requested by: Haywood Pao, MD [address] [place], [place] [address] PCP: Haywood Pao, MD   Assessment & Plan: Visit Diagnoses:  1. Pain in left hip   2. Unilateral primary osteoarthritis, left hip     Plan: I agree with the patient that a total hip arthroplasty is warranted.  Although she is 84 years old she is currently significantly active with no acute medical problems or issues at all.  I showed her hip model explained in detail what hip replacement surgery involves.  We went over discussion the risk and benefits of the surgery as well as talk about her intraoperative and postoperative course.  We will be able to set her up for staying at the [facility] skilled nursing aspect following surgery as she recovers as well.  All question concerns were answered and addressed.  We will work on getting the surgery scheduled soon.  Follow-Up Instructions: Return for 2 weeks post-op.   Orders:  Orders Placed This Encounter  Procedures  . XR HIP UNILAT W OR W/O PELVIS 1V LEFT   No orders of the defined types were placed in this encounter.     Procedures: No procedures performed   Clinical Data: No additional findings.   Subjective: Chief Complaint  Patient presents with  . Left Hip - Pain  The patient is a very pleasant and active 84 year old female sent from Dr. Ernestina Patches to consider for hip replacement surgery.  She has had steroid injections in her left hip that used to help but now they do not anymore.  She is a very active individual who lives independently.  She stays in independent living facility that does have skilled nursing.  She is very interested in hip replacement surgery at this standpoint given the failure of conservative treatment measures.  Her pain is become daily.  It occurs constantly.  It affects her  activities of daily living detrimentally as well as her quality of life detrimentally.  She is work on activity modification and strengthening exercises of her hips.  She is again had intra-articular steroid injections on multiple occasions.  HPI  Review of Systems She currently denies any headache, chest pain, shortness of breath, fever, chills, nausea, vomiting.  Objective: Vital Signs: There were no vitals taken for this visit.  Physical Exam She is alert and oriented x3 and in no acute distress Ortho Exam Examination of the right hip is normal examination of her left hip shows severe pain on internal and external rotation with significant limitations of internal extra rotation. Specialty Comments:  No specialty comments available.  Imaging: No results found. X-rays independently reviewed of her pelvis and left hip show complete loss of joint space in the left hip.  Some essentially nonvisible at all.  There is cystic changes in the femoral head and acetabulum and sclerotic changes.  There are periarticular osteophytes as well.  PMFS History: Patient Active Problem List   Diagnosis Date Noted  . Unilateral primary osteoarthritis, left hip 08/06/2018  . Pleural effusion on left 07/11/2018  . Chest pain 12/25/2017  . Abnormal findings on radiological examination of gastrointestinal tract 11/16/2017  . Primary cancer of left lower lobe of lung ([place]) 11/15/2017  .  Non-small cell cancer of right lung ([place]) 11/07/2017  . Lung mass 10/17/2017  . Breast cancer of upper-inner quadrant of right female breast ([place]) 06/10/2013  . Mass of breast, left 05/31/2013  . PERSONAL HISTORY OF COLONIC POLYPS 07/26/2010   Past Medical History:  Diagnosis Date  . Allergic rhinitis   . Anemia    during pregnancy  . Arthritis   . Breast cancer ([place]) 05/08/13   right upper inner, invasive mammary  . Breast cancer, right breast ([place]) 04/16/2013   Underwent lumpectomy on 11/06/13. Path showed G1 ILC,  2.7 cm, neg margins, receptor+, Her2neg   . Diverticulosis   . 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 Hemorrhoids   . Hx of adenomatous colonic polyps 05/2002  . Lung cancer ([place])   . Lung cancer ([place]) 10/2017  . Meningioma ([place])   . Osteoporosis   . Pneumonia    walking pneumonia  . Rosacea   . 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 Use of letrozole ([place])    neoadjuvant antiestrogen therapy with letrozole 2.5 mg daily x 7 monhts    Family History  Problem Relation Age of Onset  . Heart disease Mother   . Prostate cancer Father   . Lung cancer Sister     Past Surgical History:  Procedure Laterality Date  . BREAST BIOPSY Left 1960   lt br bx/benign  . BREAST EXCISIONAL BIOPSY Left   . BREAST LUMPECTOMY Right   . BREAST LUMPECTOMY WITH NEEDLE LOCALIZATION Bilateral 11/06/2013   Procedure: BREAST LUMPECTOMY WITH NEEDLE LOCALIZATION;  Surgeon: Haywood Lasso, MD;  Location: [facility];  Service: General;  Laterality: Bilateral;  . COLONOSCOPY    . EYE SURGERY     both cataracts  . MOHS SURGERY Right    nose basal/squamous  . VIDEO BRONCHOSCOPY WITH ENDOBRONCHIAL NAVIGATION N/A 11/01/2017   Procedure: VIDEO BRONCHOSCOPY WITH ENDOBRONCHIAL NAVIGATION;  Surgeon: [name], MD;  Location: [facility];  Service: Thoracic;  Laterality: N/A;  . WRIST SURGERY  1990   lt   Social History   Occupational History  . Occupation: Retired  Tobacco Use  . Smoking status: Former Smoker    Packs/day: 0.50    Years: 25.00    Pack years: 12.50    Types: Cigarettes    Last attempt to quit: 12/19/1978    Years since quitting: 39.6  . Smokeless tobacco: Never Used  Substance and Sexual Activity  . Alcohol use: Yes    Alcohol/week: 4.0 standard drinks    Types: 4 Glasses of wine per week  . Drug use: No  . Sexual activity: Not Currently    Comment: menarche at age14, menopause in 58, HRT x 41, age at first live birth 39's, G24 P3

## 2018-08-23 ENCOUNTER — Other Ambulatory Visit (INDEPENDENT_AMBULATORY_CARE_PROVIDER_SITE_OTHER): Payer: Self-pay | Admitting: Physician Assistant

## 2018-08-23 ENCOUNTER — Other Ambulatory Visit (INDEPENDENT_AMBULATORY_CARE_PROVIDER_SITE_OTHER): Payer: Self-pay

## 2018-08-23 ENCOUNTER — Other Ambulatory Visit: Payer: Self-pay | Admitting: Orthopaedic Surgery

## 2018-08-23 NOTE — Patient Instructions (Addendum)
[name] French  08/23/2018   Your procedure is scheduled on: 08-31-18     Report to [facility] Main  Entrance    Report to Admitting at 6:45 AM    Call this number if you have problems the morning of surgery [phone]     Remember: Do not eat food or drink liquids :After Midnight.     Take these medicines the morning of surgery with A SIP OF WATER: Cetirizine (Zyrtec), Metoprolol Tartrate (Lopressor), and Ranitidine (Zantac). You may also bring and use your eyedrops as needed.                                You may not have any metal on your body including hair pins and              piercings  Do not wear jewelry, make-up, lotions, powders or perfumes, deodorant             Do not wear nail polish.  Do not shave  48 hours prior to surgery.               Do not bring valuables to the hospital. [place].  Contacts, dentures or bridgework may not be worn into surgery.  Leave suitcase in the car. After surgery it may be brought to your room.      Special Instructions: N/A              Please read over the following fact sheets you were given: _____________________________________________________________________          [facility] - Preparing for Surgery Before surgery, you can play an important role.  Because skin is not sterile, your skin needs to be as free of germs as possible.  You can reduce the number of germs on your skin by washing with CHG (chlorahexidine gluconate) soap before surgery.  CHG is an antiseptic cleaner which kills germs and bonds with the skin to continue killing germs even after washing. Please DO NOT use if you have an allergy to CHG or antibacterial soaps.  If your skin becomes reddened/irritated stop using the CHG and inform your nurse when you arrive at Short Stay. Do not shave (including legs and underarms) for at least 48 hours prior to the first CHG shower.  You may shave  your face/neck. Please follow these instructions carefully:  1.  Shower with CHG Soap the night before surgery and the  morning of Surgery.  2.  If you choose to wash your hair, wash your hair first as usual with your  normal  shampoo.  3.  After you shampoo, rinse your hair and body thoroughly to remove the  shampoo.                           4.  Use CHG as you would any other liquid soap.  You can apply chg directly  to the skin and wash                       Gently with a scrungie or clean washcloth.  5.  Apply the CHG Soap to your body ONLY FROM THE NECK  DOWN.   Do not use on face/ open                           Wound or open sores. Avoid contact with eyes, ears mouth and genitals (private parts).                       Wash face,  Genitals (private parts) with your normal soap.             6.  Wash thoroughly, paying special attention to the area where your surgery  will be performed.  7.  Thoroughly rinse your body with warm water from the neck down.  8.  DO NOT shower/wash with your normal soap after using and rinsing off  the CHG Soap.                9.  Pat yourself dry with a clean towel.            10.  Wear clean pajamas.            11.  Place clean sheets on your bed the night of your first shower and do not  sleep with pets. Day of Surgery : Do not apply any lotions/deodorants the morning of surgery.  Please wear clean clothes to the hospital/surgery center.  FAILURE TO FOLLOW THESE INSTRUCTIONS MAY RESULT IN THE CANCELLATION OF YOUR SURGERY PATIENT SIGNATURE_________________________________  NURSE SIGNATURE__________________________________  ________________________________________________________________________   Adam Phenix  An incentive spirometer is a tool that can help keep your lungs clear and active. This tool measures how well you are filling your lungs with each breath. Taking long deep breaths may help reverse or decrease the chance of developing  breathing (pulmonary) problems (especially infection) following:  A long period of time when you are unable to move or be active. BEFORE THE PROCEDURE   If the spirometer includes an indicator to show your best effort, your nurse or respiratory therapist will set it to a desired goal.  If possible, sit up straight or lean slightly forward. Try not to slouch.  Hold the incentive spirometer in an upright position. INSTRUCTIONS FOR USE  1. Sit on the edge of your bed if possible, or sit up as far as you can in bed or on a chair. 2. Hold the incentive spirometer in an upright position. 3. Breathe out normally. 4. Place the mouthpiece in your mouth and seal your lips tightly around it. 5. Breathe in slowly and as deeply as possible, raising the piston or the ball toward the top of the column. 6. Hold your breath for 3-5 seconds or for as long as possible. Allow the piston or ball to fall to the bottom of the column. 7. Remove the mouthpiece from your mouth and breathe out normally. 8. Rest for a few seconds and repeat Steps 1 through 7 at least 10 times every 1-2 hours when you are awake. Take your time and take a few normal breaths between deep breaths. 9. The spirometer may include an indicator to show your best effort. Use the indicator as a goal to work toward during each repetition. 10. After each set of 10 deep breaths, practice coughing to be sure your lungs are clear. If you have an incision (the cut made at the time of surgery), support your incision when coughing by placing a pillow or rolled up towels firmly against it. Once you are able to  get out of bed, walk around indoors and cough well. You may stop using the incentive spirometer when instructed by your caregiver.  RISKS AND COMPLICATIONS  Take your time so you do not get dizzy or light-headed.  If you are in pain, you may need to take or ask for pain medication before doing incentive spirometry. It is harder to take a deep  breath if you are having pain. AFTER USE  Rest and breathe slowly and easily.  It can be helpful to keep track of a log of your progress. Your caregiver can provide you with a simple table to help with this. If you are using the spirometer at home, follow these instructions: Onset IF:   You are having difficultly using the spirometer.  You have trouble using the spirometer as often as instructed.  Your pain medication is not giving enough relief while using the spirometer.  You develop fever of 100.5 F (38.1 C) or higher. SEEK IMMEDIATE MEDICAL CARE IF:   You cough up bloody sputum that had not been present before.  You develop fever of 102 F (38.9 C) or greater.  You develop worsening pain at or near the incision site. MAKE SURE YOU:   Understand these instructions.  Will watch your condition.  Will get help right away if you are not doing well or get worse. Document Released: 04/17/2007 Document Revised: 02/27/2012 Document Reviewed: 06/18/2007 [facility] Patient Information 2014 [place], [place].   ________________________________________________________________________

## 2018-08-23 NOTE — Care Plan (Signed)
Spoke with patient prior to surgery. She lives in the [facility] community and will discharge to the healthcare setting for rehab prior to returning to her independent apartment. They will assess and order appropriate DME at time of discharge from this setting. I have spoken with Lorie Phenix, admissions, and they are expecting her. She will need non emergent EMS transport if family cannot take her. The facility cannot transport   Please contact Ladell Heads, Margaret with questions.

## 2018-08-23 NOTE — Progress Notes (Signed)
  07-09-18 (Epic) CXR  07-02-18 (Epic) EKG, LOV with Cardiologist  03-23-18 (Epic) Stress  12-26-17 (Epic) ECHO

## 2018-08-24 ENCOUNTER — Other Ambulatory Visit: Payer: Self-pay

## 2018-08-24 ENCOUNTER — Encounter (HOSPITAL_COMMUNITY)
Admission: RE | Admit: 2018-08-24 | Discharge: 2018-08-24 | Disposition: A | Discharge status: Home / Self Care | Payer: Medicare Other | Source: Ambulatory Visit | Attending: Orthopaedic Surgery | Admitting: Orthopaedic Surgery

## 2018-08-24 ENCOUNTER — Encounter (HOSPITAL_COMMUNITY): Payer: Self-pay

## 2018-08-24 DIAGNOSIS — M1612 Unilateral primary osteoarthritis, left hip: Secondary | ICD-10-CM | POA: Insufficient documentation

## 2018-08-24 DIAGNOSIS — Z01812 Encounter for preprocedural laboratory examination: Secondary | ICD-10-CM | POA: Diagnosis not present

## 2018-08-24 LAB — CBC
HEMATOCRIT: 42.2 % (ref 36.0–46.0)
Hemoglobin: 13.8 g/dL (ref 12.0–15.0)
MCH: 28.9 pg (ref 26.0–34.0)
MCHC: 32.7 g/dL (ref 30.0–36.0)
MCV: 88.3 fL (ref 78.0–100.0)
Platelets: 222 10*3/uL (ref 150–400)
RBC: 4.78 MIL/uL (ref 3.87–5.11)
RDW: 14.9 % (ref 11.5–15.5)
WBC: 5.9 10*3/uL (ref 4.0–10.5)

## 2018-08-24 LAB — BASIC METABOLIC PANEL
Anion gap: 8 (ref 5–15)
BUN: 14 mg/dL (ref 8–23)
CALCIUM: 9.6 mg/dL (ref 8.9–10.3)
CO2: 29 mmol/L (ref 22–32)
Chloride: 104 mmol/L (ref 98–111)
Creatinine, Ser: 0.76 mg/dL (ref 0.44–1.00)
GFR calc Af Amer: >60 mL/min (ref >60)
GFR calc non Af Amer: >60 mL/min (ref >60)
GLUCOSE: 91 mg/dL (ref 70–99)
POTASSIUM: 4.6 mmol/L (ref 3.5–5.1)
Sodium: 141 mmol/L (ref 135–145)

## 2018-08-24 LAB — SURGICAL PCR SCREEN
MRSA, PCR: NEGATIVE
Staphylococcus aureus: NEGATIVE

## 2018-08-30 NOTE — Anesthesia Preprocedure Evaluation (Addendum)
Anesthesia Evaluation  Patient identified by MRN, date of birth, ID band Patient awake    Reviewed: Allergy & Precautions, NPO status , Patient's Chart, lab work & pertinent test results, reviewed documented beta blocker date and time   History of Anesthesia Complications Negative for: history of anesthetic complications  Airway Mallampati: I  TM Distance: >3 FB Neck ROM: Full    Dental  (+) Caps, Dental Advisory Given, Missing, Partial Lower   Pulmonary former smoker,  H/o lung cancer L pleural effusion   breath sounds clear to auscultation       Cardiovascular hypertension, Pt. on medications and Pt. on home beta blockers (-) angina Rhythm:Regular Rate:Normal  4/19 stress: Nuclear stress EF: 56%. Clinically and electrically negative for ischemia, Normal perfusion, No ischemia or scar, low risk study 1/19 ECHO: EF 60-65%, mild AI   Neuro/Psych  Headaches,    GI/Hepatic Neg liver ROS, GERD  Medicated and Controlled,  Endo/Other  negative endocrine ROS  Renal/GU negative Renal ROS     Musculoskeletal  (+) Arthritis , Osteoarthritis,    Abdominal   Peds  Hematology negative hematology ROS (+)   Anesthesia Other Findings H/o breast cancer  Reproductive/Obstetrics                            Anesthesia Physical Anesthesia Plan  ASA: III  Anesthesia Plan: Spinal   Post-op Pain Management:    Induction:   PONV Risk Score and Plan: 2 and Ondansetron and Dexamethasone  Airway Management Planned: Natural Airway and Nasal Cannula  Additional Equipment:   Intra-op Plan:   Post-operative Plan:   Informed Consent: I have reviewed the patients History and Physical, chart, labs and discussed the procedure including the risks, benefits and alternatives for the proposed anesthesia with the patient or authorized representative who has indicated his/her understanding and acceptance.   Dental  advisory given  Plan Discussed with: CRNA and Surgeon  Anesthesia Plan Comments: (Plan routine monitors, SAB)        Anesthesia Quick Evaluation

## 2018-08-31 ENCOUNTER — Other Ambulatory Visit: Payer: Self-pay

## 2018-08-31 ENCOUNTER — Inpatient Hospital Stay (HOSPITAL_COMMUNITY): Payer: Medicare Other

## 2018-08-31 ENCOUNTER — Inpatient Hospital Stay (HOSPITAL_COMMUNITY): Payer: Medicare Other | Admitting: Certified Registered Nurse Anesthetist

## 2018-08-31 ENCOUNTER — Inpatient Hospital Stay (HOSPITAL_COMMUNITY)
Admission: RE | Admit: 2018-08-31 | Discharge: 2018-09-03 | DRG: 470 | Disposition: A | Discharge status: Skilled Nursing Facility | Payer: Medicare Other | Source: Ambulatory Visit | Attending: Orthopaedic Surgery | Admitting: Orthopaedic Surgery

## 2018-08-31 ENCOUNTER — Encounter (HOSPITAL_COMMUNITY): Payer: Self-pay | Admitting: *Deleted

## 2018-08-31 ENCOUNTER — Encounter (HOSPITAL_COMMUNITY)
Admission: RE | Disposition: A | Discharge status: Skilled Nursing Facility | Payer: Self-pay | Source: Ambulatory Visit | Attending: Orthopaedic Surgery

## 2018-08-31 DIAGNOSIS — Z79899 Other long term (current) drug therapy: Secondary | ICD-10-CM | POA: Diagnosis not present

## 2018-08-31 DIAGNOSIS — Z881 Allergy status to other antibiotic agents status: Secondary | ICD-10-CM | POA: Diagnosis not present

## 2018-08-31 DIAGNOSIS — M810 Age-related osteoporosis without current pathological fracture: Secondary | ICD-10-CM | POA: Diagnosis present

## 2018-08-31 DIAGNOSIS — Z96642 Presence of left artificial hip joint: Secondary | ICD-10-CM | POA: Diagnosis not present

## 2018-08-31 DIAGNOSIS — D62 Acute posthemorrhagic anemia: Secondary | ICD-10-CM | POA: Diagnosis not present

## 2018-08-31 DIAGNOSIS — Z853 Personal history of malignant neoplasm of breast: Secondary | ICD-10-CM | POA: Diagnosis not present

## 2018-08-31 DIAGNOSIS — Z87891 Personal history of nicotine dependence: Secondary | ICD-10-CM

## 2018-08-31 DIAGNOSIS — Z9181 History of falling: Secondary | ICD-10-CM

## 2018-08-31 DIAGNOSIS — K219 Gastro-esophageal reflux disease without esophagitis: Secondary | ICD-10-CM | POA: Diagnosis not present

## 2018-08-31 DIAGNOSIS — M1612 Unilateral primary osteoarthritis, left hip: Principal | ICD-10-CM | POA: Diagnosis present

## 2018-08-31 DIAGNOSIS — I10 Essential (primary) hypertension: Secondary | ICD-10-CM | POA: Diagnosis not present

## 2018-08-31 DIAGNOSIS — Z885 Allergy status to narcotic agent status: Secondary | ICD-10-CM

## 2018-08-31 DIAGNOSIS — Z85118 Personal history of other malignant neoplasm of bronchus and lung: Secondary | ICD-10-CM

## 2018-08-31 DIAGNOSIS — Z86011 Personal history of benign neoplasm of the brain: Secondary | ICD-10-CM

## 2018-08-31 DIAGNOSIS — Z471 Aftercare following joint replacement surgery: Secondary | ICD-10-CM | POA: Diagnosis not present

## 2018-08-31 HISTORY — PX: TOTAL HIP ARTHROPLASTY: SHX124

## 2018-08-31 SURGERY — ARTHROPLASTY, HIP, TOTAL, ANTERIOR APPROACH
Anesthesia: Spinal | Site: Hip | Laterality: Left

## 2018-08-31 MED ORDER — PHENYLEPHRINE HCL-NACL 10-0.9 MG/250ML-% IV SOLN
INTRAVENOUS | Status: AC
  Filled 2018-08-31: qty 250

## 2018-08-31 MED ORDER — FENTANYL CITRATE (PF) 100 MCG/2ML IJ SOLN: 25.0000 ug | INTRAMUSCULAR | Status: DC | PRN

## 2018-08-31 MED ORDER — DOCUSATE SODIUM 100 MG PO CAPS
100.0000 mg | ORAL_CAPSULE | Freq: Two times a day (BID) | ORAL | Status: DC
  Administered 2018-08-31 – 2018-09-03 (×6): 100 mg via ORAL
  Filled 2018-08-31 (×6): qty 1

## 2018-08-31 MED ORDER — HYDROCODONE-ACETAMINOPHEN 7.5-325 MG PO TABS: 1.0000 | ORAL_TABLET | ORAL | Status: DC | PRN

## 2018-08-31 MED ORDER — ONDANSETRON HCL 4 MG/2ML IJ SOLN: 4.0000 mg | Freq: Four times a day (QID) | INTRAMUSCULAR | Status: DC | PRN

## 2018-08-31 MED ORDER — PHENOL 1.4 % MT LIQD: 1.0000 | OROMUCOSAL | Status: DC | PRN

## 2018-08-31 MED ORDER — METOCLOPRAMIDE HCL 5 MG/ML IJ SOLN: 5.0000 mg | Freq: Three times a day (TID) | INTRAMUSCULAR | Status: DC | PRN

## 2018-08-31 MED ORDER — SODIUM CHLORIDE 0.9 % IR SOLN
Status: DC | PRN
  Administered 2018-08-31: 1000 mL

## 2018-08-31 MED ORDER — HYDROCODONE-ACETAMINOPHEN 5-325 MG PO TABS: 1.0000 | ORAL_TABLET | ORAL | Status: DC | PRN

## 2018-08-31 MED ORDER — FAMOTIDINE 10 MG PO TABS
10.0000 mg | ORAL_TABLET | Freq: Two times a day (BID) | ORAL | Status: DC
  Administered 2018-08-31 – 2018-09-03 (×6): 10 mg via ORAL
  Filled 2018-08-31 (×7): qty 1

## 2018-08-31 MED ORDER — MENTHOL 3 MG MT LOZG: 1.0000 | LOZENGE | OROMUCOSAL | Status: DC | PRN

## 2018-08-31 MED ORDER — PROPOFOL 500 MG/50ML IV EMUL
INTRAVENOUS | Status: DC | PRN
  Administered 2018-08-31: 50 ug/kg/min via INTRAVENOUS

## 2018-08-31 MED ORDER — DEXAMETHASONE SODIUM PHOSPHATE 10 MG/ML IJ SOLN
INTRAMUSCULAR | Status: AC
  Filled 2018-08-31: qty 1

## 2018-08-31 MED ORDER — DIPHENHYDRAMINE HCL 12.5 MG/5ML PO ELIX: 12.5000 mg | ORAL_SOLUTION | ORAL | Status: DC | PRN

## 2018-08-31 MED ORDER — ONDANSETRON HCL 4 MG/2ML IJ SOLN
INTRAMUSCULAR | Status: DC | PRN
  Administered 2018-08-31: 4 mg via INTRAVENOUS

## 2018-08-31 MED ORDER — NAPHAZOLINE-PHENIRAMINE 0.025-0.3 % OP SOLN
1.0000 [drp] | OPHTHALMIC | Status: DC | PRN
  Filled 2018-08-31: qty 15

## 2018-08-31 MED ORDER — ONDANSETRON HCL 4 MG PO TABS: 4.0000 mg | ORAL_TABLET | Freq: Four times a day (QID) | ORAL | Status: DC | PRN

## 2018-08-31 MED ORDER — DEXAMETHASONE SODIUM PHOSPHATE 10 MG/ML IJ SOLN
INTRAMUSCULAR | Status: DC | PRN
  Administered 2018-08-31: 10 mg via INTRAVENOUS

## 2018-08-31 MED ORDER — ZOLPIDEM TARTRATE 5 MG PO TABS
5.0000 mg | ORAL_TABLET | Freq: Every evening | ORAL | Status: DC | PRN
  Administered 2018-08-31 – 2018-09-01 (×2): 5 mg via ORAL
  Filled 2018-08-31 (×2): qty 1

## 2018-08-31 MED ORDER — BUPIVACAINE IN DEXTROSE 0.75-8.25 % IT SOLN
INTRATHECAL | Status: DC | PRN
  Administered 2018-08-31: 1.8 mL via INTRATHECAL

## 2018-08-31 MED ORDER — PROPOFOL 10 MG/ML IV BOLUS
INTRAVENOUS | Status: AC
  Filled 2018-08-31: qty 60

## 2018-08-31 MED ORDER — METHOCARBAMOL 500 MG PO TABS
500.0000 mg | ORAL_TABLET | Freq: Four times a day (QID) | ORAL | Status: DC | PRN
  Administered 2018-09-01 – 2018-09-02 (×3): 500 mg via ORAL
  Filled 2018-08-31 (×3): qty 1

## 2018-08-31 MED ORDER — METOPROLOL TARTRATE 12.5 MG HALF TABLET
12.5000 mg | ORAL_TABLET | Freq: Two times a day (BID) | ORAL | Status: DC
  Administered 2018-08-31 – 2018-09-03 (×6): 12.5 mg via ORAL
  Filled 2018-08-31 (×6): qty 1

## 2018-08-31 MED ORDER — TRANEXAMIC ACID 1000 MG/10ML IV SOLN
1000.0000 mg | INTRAVENOUS | Status: AC
  Administered 2018-08-31: 1000 mg via INTRAVENOUS
  Filled 2018-08-31: qty 10

## 2018-08-31 MED ORDER — MORPHINE SULFATE (PF) 2 MG/ML IV SOLN: 0.5000 mg | INTRAVENOUS | Status: DC | PRN

## 2018-08-31 MED ORDER — ONDANSETRON HCL 4 MG/2ML IJ SOLN
INTRAMUSCULAR | Status: AC
  Filled 2018-08-31: qty 2

## 2018-08-31 MED ORDER — METOCLOPRAMIDE HCL 5 MG PO TABS: 5.0000 mg | ORAL_TABLET | Freq: Three times a day (TID) | ORAL | Status: DC | PRN

## 2018-08-31 MED ORDER — STERILE WATER FOR IRRIGATION IR SOLN
Status: DC | PRN
  Administered 2018-08-31: 2000 mL

## 2018-08-31 MED ORDER — METHOCARBAMOL 500 MG IVPB - SIMPLE MED
500.0000 mg | Freq: Four times a day (QID) | INTRAVENOUS | Status: DC | PRN
  Filled 2018-08-31: qty 50

## 2018-08-31 MED ORDER — NITROGLYCERIN 0.4 MG SL SUBL: 0.4000 mg | SUBLINGUAL_TABLET | SUBLINGUAL | Status: DC | PRN

## 2018-08-31 MED ORDER — LACTATED RINGERS IV SOLN
INTRAVENOUS | Status: DC
  Administered 2018-08-31 (×2): via INTRAVENOUS

## 2018-08-31 MED ORDER — CHLORHEXIDINE GLUCONATE 4 % EX LIQD: 60.0000 mL | Freq: Once | CUTANEOUS | Status: DC

## 2018-08-31 MED ORDER — VITAMIN D3 25 MCG (1000 UNIT) PO TABS
1000.0000 [IU] | ORAL_TABLET | Freq: Every day | ORAL | Status: DC
  Administered 2018-09-01 – 2018-09-03 (×3): 1000 [IU] via ORAL
  Filled 2018-08-31 (×5): qty 1

## 2018-08-31 MED ORDER — TRAMADOL HCL 50 MG PO TABS
100.0000 mg | ORAL_TABLET | Freq: Three times a day (TID) | ORAL | Status: DC | PRN
  Administered 2018-08-31: 100 mg via ORAL
  Filled 2018-08-31: qty 2

## 2018-08-31 MED ORDER — CEFAZOLIN SODIUM-DEXTROSE 2-4 GM/100ML-% IV SOLN
2.0000 g | INTRAVENOUS | Status: AC
  Administered 2018-08-31: 2 g via INTRAVENOUS
  Filled 2018-08-31: qty 100

## 2018-08-31 MED ORDER — PROPOFOL 10 MG/ML IV BOLUS
INTRAVENOUS | Status: DC | PRN
  Administered 2018-08-31 (×9): 10 mg via INTRAVENOUS

## 2018-08-31 MED ORDER — CEFAZOLIN SODIUM-DEXTROSE 1-4 GM/50ML-% IV SOLN
1.0000 g | Freq: Four times a day (QID) | INTRAVENOUS | Status: AC
  Administered 2018-08-31 (×2): 1 g via INTRAVENOUS
  Filled 2018-08-31 (×2): qty 50

## 2018-08-31 MED ORDER — OXYCODONE HCL 5 MG PO TABS
5.0000 mg | ORAL_TABLET | ORAL | Status: DC | PRN
  Administered 2018-08-31 – 2018-09-02 (×3): 5 mg via ORAL
  Administered 2018-09-02: 10 mg via ORAL
  Administered 2018-09-03: 5 mg via ORAL
  Filled 2018-08-31: qty 2
  Filled 2018-08-31 (×5): qty 1

## 2018-08-31 MED ORDER — 0.9 % SODIUM CHLORIDE (POUR BTL) OPTIME
TOPICAL | Status: DC | PRN
  Administered 2018-08-31: 1000 mL

## 2018-08-31 MED ORDER — ASPIRIN 81 MG PO CHEW
81.0000 mg | CHEWABLE_TABLET | Freq: Two times a day (BID) | ORAL | Status: DC
  Administered 2018-08-31 – 2018-09-03 (×6): 81 mg via ORAL
  Filled 2018-08-31 (×6): qty 1

## 2018-08-31 MED ORDER — ACETAMINOPHEN 325 MG PO TABS
325.0000 mg | ORAL_TABLET | Freq: Four times a day (QID) | ORAL | Status: DC | PRN
  Administered 2018-09-01 – 2018-09-03 (×3): 650 mg via ORAL
  Filled 2018-08-31 (×3): qty 2

## 2018-08-31 MED ORDER — PRAVASTATIN SODIUM 40 MG PO TABS
40.0000 mg | ORAL_TABLET | Freq: Every day | ORAL | Status: DC
  Administered 2018-08-31 – 2018-09-02 (×3): 40 mg via ORAL
  Filled 2018-08-31 (×2): qty 2
  Filled 2018-08-31: qty 1
  Filled 2018-08-31: qty 2
  Filled 2018-08-31 (×2): qty 1

## 2018-08-31 MED ORDER — SODIUM CHLORIDE 0.9 % IV SOLN
INTRAVENOUS | Status: DC
  Administered 2018-08-31 – 2018-09-01 (×2): via INTRAVENOUS

## 2018-08-31 MED ORDER — LORATADINE 10 MG PO TABS
10.0000 mg | ORAL_TABLET | Freq: Every day | ORAL | Status: DC
  Administered 2018-09-01 – 2018-09-03 (×3): 10 mg via ORAL
  Filled 2018-08-31 (×3): qty 1

## 2018-08-31 MED ORDER — ALUM & MAG HYDROXIDE-SIMETH 200-200-20 MG/5ML PO SUSP: 30.0000 mL | ORAL | Status: DC | PRN

## 2018-08-31 MED ORDER — SODIUM CHLORIDE 0.9 % IV SOLN
INTRAVENOUS | Status: DC | PRN
  Administered 2018-08-31: 20 ug/min via INTRAVENOUS

## 2018-08-31 MED ORDER — POLYETHYLENE GLYCOL 3350 17 G PO PACK
17.0000 g | PACK | Freq: Every day | ORAL | Status: DC | PRN
  Administered 2018-09-02: 17 g via ORAL
  Filled 2018-08-31: qty 1

## 2018-08-31 SURGICAL SUPPLY — 38 items
APL SKNCLS STERI-STRIP NONHPOA (GAUZE/BANDAGES/DRESSINGS)
BAG SPEC THK2 15X12 ZIP CLS (MISCELLANEOUS)
BAG ZIPLOCK 12X15 (MISCELLANEOUS) IMPLANT
BENZOIN TINCTURE PRP APPL 2/3 (GAUZE/BANDAGES/DRESSINGS) IMPLANT
BLADE SAW SGTL 18X1.27X75 (BLADE) ×2 IMPLANT
BLADE SAW SGTL 18X1.27X75MM (BLADE) ×1
CLOSURE WOUND 1/2 X4 (GAUZE/BANDAGES/DRESSINGS)
COVER PERINEAL POST (MISCELLANEOUS) ×3 IMPLANT
COVER SURGICAL LIGHT HANDLE (MISCELLANEOUS) ×3 IMPLANT
CUP SECTOR GRIPTON 50MM (Cup) ×2 IMPLANT
DRAPE STERI IOBAN 125X83 (DRAPES) ×3 IMPLANT
DRAPE U-SHAPE 47X51 STRL (DRAPES) ×6 IMPLANT
DRSG AQUACEL AG ADV 3.5X10 (GAUZE/BANDAGES/DRESSINGS) ×3 IMPLANT
DURAPREP 26ML APPLICATOR (WOUND CARE) ×3 IMPLANT
ELECT REM PT RETURN 15FT ADLT (MISCELLANEOUS) ×3 IMPLANT
GAUZE XEROFORM 1X8 LF (GAUZE/BANDAGES/DRESSINGS) ×2 IMPLANT
GLOVE BIO SURGEON STRL SZ7.5 (GLOVE) ×3 IMPLANT
GLOVE BIOGEL PI IND STRL 8 (GLOVE) ×2 IMPLANT
GLOVE BIOGEL PI INDICATOR 8 (GLOVE) ×4
GLOVE ECLIPSE 8.0 STRL XLNG CF (GLOVE) ×3 IMPLANT
GOWN STRL REUS W/TWL XL LVL3 (GOWN DISPOSABLE) ×6 IMPLANT
HANDPIECE INTERPULSE COAX TIP (DISPOSABLE) ×3
HEAD FEM STD 32X+1 STRL (Hips) ×2 IMPLANT
HOLDER FOLEY CATH W/STRAP (MISCELLANEOUS) ×3 IMPLANT
LINER ACETABULAR 32X50 (Liner) ×2 IMPLANT
PACK ANTERIOR HIP CUSTOM (KITS) ×3 IMPLANT
SET HNDPC FAN SPRY TIP SCT (DISPOSABLE) ×1 IMPLANT
STAPLER VISISTAT 35W (STAPLE) IMPLANT
STEM CORAIL KLA11 (Stem) ×2 IMPLANT
STRIP CLOSURE SKIN 1/2X4 (GAUZE/BANDAGES/DRESSINGS) IMPLANT
SUT ETHIBOND NAB CT1 #1 30IN (SUTURE) ×3 IMPLANT
SUT MNCRL AB 4-0 PS2 18 (SUTURE) IMPLANT
SUT VIC AB 0 CT1 36 (SUTURE) ×3 IMPLANT
SUT VIC AB 1 CT1 36 (SUTURE) ×3 IMPLANT
SUT VIC AB 2-0 CT1 27 (SUTURE) ×6
SUT VIC AB 2-0 CT1 TAPERPNT 27 (SUTURE) ×2 IMPLANT
TRAY FOLEY MTR SLVR 16FR STAT (SET/KITS/TRAYS/PACK) ×3 IMPLANT
YANKAUER SUCT BULB TIP 10FT TU (MISCELLANEOUS) ×3 IMPLANT

## 2018-08-31 NOTE — Anesthesia Postprocedure Evaluation (Signed)
Anesthesia Post Note  Patient: [name] French  Procedure(s) Performed: LEFT TOTAL HIP ARTHROPLASTY ANTERIOR APPROACH (Left Hip)     Patient location during evaluation: PACU Anesthesia Type: Spinal Level of consciousness: awake and alert, oriented and patient cooperative Pain management: pain level controlled Vital Signs Assessment: post-procedure vital signs reviewed and stable Respiratory status: spontaneous breathing, nonlabored ventilation and respiratory function stable Cardiovascular status: blood pressure returned to baseline and stable Postop Assessment: spinal receding, patient able to bend at knees and no apparent nausea or vomiting Anesthetic complications: no    Last Vitals:  Vitals:   08/31/18 1410 08/31/18 1511  BP: 133/65 138/83  Pulse: (!) 114 (!) 107  Resp: 16 15  Temp: 36.4 C   SpO2: 100% 99%    Last Pain:  Vitals:   08/31/18 1546  TempSrc:   PainSc: 1                  [name],[name]

## 2018-08-31 NOTE — Brief Op Note (Signed)
08/31/2018  10:21 AM  PATIENT:  Elisha Headland  84 y.o. female  PRE-OPERATIVE DIAGNOSIS:  osteoarthritis left hip  POST-OPERATIVE DIAGNOSIS:  osteoarthritis left hip  PROCEDURE:  Procedure(s): LEFT TOTAL HIP ARTHROPLASTY ANTERIOR APPROACH (Left)  SURGEON:  Surgeon(s) and Role:    Mcarthur Rossetti, MD - Primary  PHYSICIAN ASSISTANT: Benita Stabile, PA-C assisted  ANESTHESIA:   spinal  EBL:  100 mL   COUNTS:  YES  DICTATION: .Other Dictation: Dictation Number [id number]  PLAN OF CARE: Admit to inpatient   PATIENT DISPOSITION:  PACU - hemodynamically stable.   Delay start of Pharmacological VTE agent (>24hrs) due to surgical blood loss or risk of bleeding: no

## 2018-08-31 NOTE — Anesthesia Procedure Notes (Signed)
Spinal  Start time: 08/31/2018 9:00 AM End time: 08/31/2018 9:06 AM Staffing Anesthesiologist: Annye Asa, MD Resident/CRNA: Montel Clock, CRNA Performed: resident/CRNA  Preanesthetic Checklist Completed: patient identified, surgical consent, pre-op evaluation, timeout performed, IV checked, risks and benefits discussed and monitors and equipment checked Spinal Block Patient position: sitting Prep: DuraPrep Patient monitoring: heart rate, cardiac monitor, continuous pulse ox and blood pressure Approach: midline Location: L3-4 Injection technique: single-shot Needle Needle type: Pencan  Needle gauge: 24 G Needle length: 10 cm Needle insertion depth: 6.5 cm Assessment Sensory level: T6 Additional Notes Lot [id number] Expires 2019-09-18. Timeout performed. Positive CSF, negative heme/parasthesia. Patient tolerated well.

## 2018-08-31 NOTE — Evaluation (Signed)
Physical Therapy Evaluation Patient Details Name: [name] French MRN: [id number] DOB: 12/06/1926 Today's Date: 08/31/2018   History of Present Illness  Pt is a 84 YO female s/p L DA-THA on 08/31/18. PMH includes L pleural effusion 07/11/18, lung cancer 2018, breast cancer 2014. Past surgical history includes breast lumpectomy, cataracts.   Clinical Impression   Pt is s/p L DA-THA. Pt presents with difficulty performing mobility tasks, decreased activity tolerance for ambulation, L hip pain especially with mobility. Pt to benefit from acute PT to address deficits. Pt ambulated 60 ft with RW with min guard assist today. PT to continue to progress mobility and follow acutely.     Follow Up Recommendations Follow surgeon's recommendation for DC plan and follow-up therapies;Supervision for mobility/OOB(Pt states she is to d/c to SNF at [facility], surgeon H&P states HHPT)    Equipment Recommendations  Rolling walker with 5" wheels    Recommendations for Other Services       Precautions / Restrictions Precautions Precautions: Fall Restrictions Weight Bearing Restrictions: No Other Position/Activity Restrictions: WBAT       Mobility  Bed Mobility Overal bed mobility: Needs Assistance Bed Mobility: Supine to Sit     Supine to sit: Min assist     General bed mobility comments: Min assist for LLE management, pt completing scooting to EOB and steadying.   Transfers Overall transfer level: Needs assistance Equipment used: Rolling walker (2 wheeled) Transfers: Sit to/from Stand Sit to Stand: Min assist         General transfer comment: Min assist for power up, pt self-steadied upon standing. Verbal cuing for hand placement.   Ambulation/Gait Ambulation/Gait assistance: Min guard Gait Distance (Feet): 60 Feet Assistive device: Rolling walker (2 wheeled) Gait Pattern/deviations: Step-to pattern;Decreased stance time - left;Decreased weight shift to left;Antalgic Gait  velocity: decr    General Gait Details: pt with increased burning along L thigh with ambulation. Min guard for safety. Verbal cuing for sequencing.   Stairs            Wheelchair Mobility    Modified Rankin (Stroke Patients Only)       Balance Overall balance assessment: Mild deficits observed, not formally tested                                           Pertinent Vitals/Pain Pain Assessment: 0-10 Pain Score: 2  Pain Location: L hip  Pain Descriptors / Indicators: Burning;Sore Pain Intervention(s): Limited activity within patient's tolerance;Monitored during session;Ice applied;Repositioned    Home Living Family/patient expects to be discharged to:: Assisted living([facility]; Pt to be d/c to SNF part of community) Living Arrangements: Alone             Home Equipment: Cane - single point      Prior Function Level of Independence: Independent               Hand Dominance   Dominant Hand: Right    Extremity/Trunk Assessment   Upper Extremity Assessment Upper Extremity Assessment: Overall WFL for tasks assessed    Lower Extremity Assessment Lower Extremity Assessment: Overall WFL for tasks assessed;LLE deficits/detail LLE Deficits / Details: suspected post-surgical hip weakness; able to perform quad sets x5, ankle pumps  LLE Sensation: WNL    Cervical / Trunk Assessment Cervical / Trunk Assessment: Normal  Communication   Communication: HOH  Cognition Arousal/Alertness: Awake/alert Behavior During  Therapy: WFL for tasks assessed/performed Overall Cognitive Status: Within Functional Limits for tasks assessed                                        General Comments      Exercises Total Joint Exercises Ankle Circles/Pumps: AROM;Both;10 reps;Seated Quad Sets: AROM;Left;5 reps;Supine Heel Slides: AAROM;Left;Supine(3 reps )   Assessment/Plan    PT Assessment Patient needs continued  PT services  PT Problem List Decreased strength;Pain;Decreased range of motion;Decreased activity tolerance;Decreased knowledge of use of DME;Decreased balance;Decreased safety awareness;Decreased mobility       PT Treatment Interventions DME instruction;Therapeutic activities;Gait training;Therapeutic exercise;Patient/family education;Balance training;Functional mobility training    PT Goals (Current goals can be found in the Care Plan section)  Acute Rehab PT Goals PT Goal Formulation: With patient Time For Goal Achievement: 08/31/18 Potential to Achieve Goals: Good    Frequency 7X/week   Barriers to discharge        Co-evaluation               AM-PAC PT "6 Clicks" Daily Activity  Outcome Measure Difficulty turning over in bed (including adjusting bedclothes, sheets and blankets)?: Unable Difficulty moving from lying on back to sitting on the side of the bed? : Unable Difficulty sitting down on and standing up from a chair with arms (e.g., wheelchair, bedside commode, etc,.)?: Unable Help needed moving to and from a bed to chair (including a wheelchair)?: A Little Help needed walking in hospital room?: A Little Help needed climbing 3-5 steps with a railing? : A Little 6 Click Score: 12    End of Session Equipment Utilized During Treatment: Gait belt Activity Tolerance: Patient tolerated treatment well Patient left: in chair;with chair alarm set;with call bell/phone within reach;with SCD's reapplied Nurse Communication: Mobility status PT Visit Diagnosis: Unsteadiness on feet (R26.81);Difficulty in walking, not elsewhere classified (R26.2)    Time: 7048-8891 PT Time Calculation (min) (ACUTE ONLY): 28 min   Charges:   PT Evaluation $PT Eval Low Complexity: 1 Low PT Treatments $Gait Training: 8-22 mins       Julien Girt, PT Acute Rehabilitation Services Pager [phone]  Office [phone]  Roxine Caddy D Elonda Husky 08/31/2018, 6:28 PM

## 2018-08-31 NOTE — Op Note (Signed)
NAME: [name] French, BERKERY MEDICAL RECORD [id number] ACCOUNT [id number] DATE OF BIRTH:25-Oct-1926 FACILITY: [facility] LOCATION: [facility] PHYSICIAN:Jeffory Snelgrove [place], MD  OPERATIVE REPORT  DATE OF PROCEDURE:  08/31/2018  PREOPERATIVE DIAGNOSIS:  Primary osteoarthritis and degenerative joint disease, left hip.  POSTOPERATIVE DIAGNOSIS:  Primary osteoarthritis and degenerative joint disease, left hip.  PROCEDURE:  Left total hip arthroplasty through direct anterior approach.  IMPLANTS:  DePuy Sector Gription acetabular component size 50, size 32+0 neutral polyethylene liner, size 11 Corail femoral component with varus offset, size 32+1 metal hip ball.  SURGEON:  Lind Guest. Ninfa Linden, MD  ASSISTANT:  Erskine Emery, PA-C.  ANESTHESIA:  Spinal.  ANTIBIOTICS:  2 grams IV Ancef.  ESTIMATED BLOOD LOSS:  100 mL.  COMPLICATIONS:  None.  INDICATIONS:  The patient is an 84 year old very active female with debilitating arthritis involving her left hip.  I have seen her for a long period of time for this.  Her right hip is normal, but her left hip shows severe end-stage arthritis.  At this  point, she has tried and failed all forms of conservative treatment.  This has detrimentally affected activities of daily living, her quality of life and mobility to the point she does wish to proceed with a total hip arthroplasty.  She is cleared from a  medical standpoint.  We talked to her in length about the risk of acute blood loss anemia, nerve or vessel injury, fracture, infection, dislocation, DVT and implant failure.  She understands our goals are to decrease pain, improve mobility and overall  improve quality of life.  DESCRIPTION OF PROCEDURE:  After informed consent was obtained and appropriate left hip was marked.  She was brought to the operating room and sat up on a stretcher where spinal anesthesia was obtained.  She was then laid in supine position.  A Foley  catheter was placed and both  feet had traction boots applied to them.  Prior to putting the traction boots on, I did assess her leg length and she feels close to normal leg length.  We then placed her supine on the Hana fracture table with a perineal  post in place and both legs in line skeletal traction device and no traction applied.  Her left operative hip was prepped and draped with DuraPrep and sterile drapes.  A time-out was called to identify correct patient, correct left hip.  I then made an  incision just inferior and posterior to the anterior superior iliac spine and carried this obliquely down the leg.  I dissected down tensor fascia lata muscle.  Tensor fascia was then divided longitudinally to proceed with direct anterior approach to the  hip.  We identified and cauterized circumflex vessels and identified the hip capsule, opened the hip capsule in an L-type format finding a large joint effusion and significant arthritis throughout her left hip.  Cobra retractors were placed in and  around the medial and lateral femoral neck and we made our femoral neck cut with an oscillating saw and completed this with an osteotome.  We placed a corkscrew guide in the femoral head and removed the femoral head in its entirety and found a wide area  devoid of cartilage.  I then placed a bent Hohmann over the medial acetabular rim and removed remnants of the acetabular labrum and other debris.  We then began reaming under direct visualization from a size 43 reamer in stepwise increments up to a size  49 with all reamers under direct visualization, the last reamer under  direct fluoroscopy, so we could obtain our depth in reaming our inclination and anteversion.  I then placed the real DePuy Sector Gription acetabular component size 50 and a 32+0  neutral polyethylene liner for that size 50 acetabular component.  Attention was then turned to the femur.  With the leg externally rotated to 120 degrees, extended and adducted, we are to place a  Mueller retractor medially and a Hohmann retractor behind  the greater trochanter.  We released lateral joint capsule and used a box-cutting osteotome to enter the femoral canal and a rongeur to lateralize then began broaching from a size 8 broach using the Corail broaching system up to a size 11.  With a size  11 in place, we tried a varus offset femoral neck based off our templating anatomy on plain films and we placed a 32+1 trial hip ball, reduced this in the acetabulum and I was pleased with stability, leg length, offset and range of motion.  I dislocated  the hip and removed the trial components.  We placed the real Corail femoral component size 11 with varus offset and the real 32+1 metal hip ball and again reduced this in the acetabulum and it was stable.  We then irrigated the soft tissue with normal  saline solution using pulsatile lavage.  Closed the joint capsule with interrupted #1 Ethibond suture, followed by running 0 Vicryl and tensor fascia, 0 Vicryl in the deep tissue, 2-0 Vicryl subcutaneous tissue and interrupted staples on the skin.   Xeroform and Aquacel dressing was applied.  She was taken off the Hana table and taken to recovery room in stable condition.  All final counts were correct.  There were no complications noted.  Note Benita Stabile, PA-C, assisted the entire case.  Assistance  was crucial for facilitating all aspects of this case.  TN/NUANCE  D:08/31/2018 T:08/31/2018 JOB:[id number]

## 2018-08-31 NOTE — H&P (Signed)
TOTAL HIP ADMISSION H&P  Patient is admitted for left total hip arthroplasty.  Subjective:  Chief Complaint: left hip pain  HPI: [name] French, 84 y.o. female, has a history of pain and functional disability in the left hip(s) due to arthritis and patient has failed non-surgical conservative treatments for greater than 12 weeks to include NSAID's and/or analgesics, corticosteriod injections, use of assistive devices and activity modification.  Onset of symptoms was gradual starting 3 years ago with gradually worsening course since that time.The patient noted no past surgery on the left hip(s).  Patient currently rates pain in the left hip at 9 out of 10 with activity. Patient has night pain, worsening of pain with activity and weight bearing, trendelenberg gait, pain that interfers with activities of daily living and pain with passive range of motion. Patient has evidence of subchondral cysts, subchondral sclerosis, periarticular osteophytes and joint space narrowing by imaging studies. This condition presents safety issues increasing the risk of falls.  There is no current active infection.  Patient Active Problem List   Diagnosis Date Noted  . Unilateral primary osteoarthritis, left hip 08/06/2018  . Pleural effusion on left 07/11/2018  . Chest pain 12/25/2017  . Abnormal findings on radiological examination of gastrointestinal tract 11/16/2017  . Primary cancer of left lower lobe of lung ([place]) 11/15/2017  . Non-small cell cancer of right lung ([place]) 11/07/2017  . Lung mass 10/17/2017  . Breast cancer of upper-inner quadrant of right female breast ([place]) 06/10/2013  . Mass of breast, left 05/31/2013  . PERSONAL HISTORY OF COLONIC POLYPS 07/26/2010   Past Medical History:  Diagnosis Date  . Allergic rhinitis   . Anemia    during pregnancy  . Arthritis   . Breast cancer ([place]) 05/08/13   right upper inner, invasive mammary  . Breast cancer, right breast ([place]) 04/16/2013   Underwent  lumpectomy on 11/06/13. Path showed G1 ILC, 2.7 cm, neg margins, receptor+, Her2neg   . Diverticulosis   . 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 Hemorrhoids   . Hx of adenomatous colonic polyps 05/2002  . Lung cancer ([place])   . Lung cancer ([place]) 10/2017  . Meningioma ()   . Osteoporosis   . Pneumonia    walking pneumonia  . Rosacea   . 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 Use of letrozole ([place])    neoadjuvant antiestrogen therapy with letrozole 2.5 mg daily x 7 monhts    Past Surgical History:  Procedure Laterality Date  . BREAST BIOPSY Left 1960   lt br bx/benign  . BREAST EXCISIONAL BIOPSY Left   . BREAST LUMPECTOMY Right   . BREAST LUMPECTOMY WITH NEEDLE LOCALIZATION Bilateral 11/06/2013   Procedure: BREAST LUMPECTOMY WITH NEEDLE LOCALIZATION;  Surgeon: Haywood Lasso, MD;  Location: [facility];  Service: General;  Laterality: Bilateral;  . COLONOSCOPY    . EYE SURGERY     both cataracts  . MOHS SURGERY Right    nose basal/squamous  . VIDEO BRONCHOSCOPY WITH ENDOBRONCHIAL NAVIGATION N/A 11/01/2017   Procedure: VIDEO BRONCHOSCOPY WITH ENDOBRONCHIAL NAVIGATION;  Surgeon: [name], MD;  Location: [facility];  Service: Thoracic;  Laterality: N/A;  . WRIST SURGERY  1990   lt    Current Facility-Administered Medications  Medication Dose Route Frequency Provider Last Rate Last Dose  . ceFAZolin (ANCEF) IVPB 2g/100 mL premix  2 g Intravenous On Call to OR Pete Pelt, PA-C      . chlorhexidine (  HIBICLENS) 4 % liquid 4 application  60 mL Topical Once Erskine Emery W, PA-C      . lactated ringers infusion   Intravenous Continuous Annye Asa, MD      . phenylephrine (NEOSYNEPHRINE) 10-0.9 MG/250ML-% infusion           . tranexamic acid (CYKLOKAPRON) 1,000 mg in sodium chloride 0.9 % 100 mL IVPB  1,000 mg Intravenous To OR Pete Pelt, PA-C       Allergies  Allergen Reactions  . Codeine Itching    Insomnia   . Hydrocodone  Itching and Other (See Comments)    INTOLERANCE >  Insomnia  . Neosporin [Neomycin-Bacitracin Zn-Polymyx] Rash    Social History   Tobacco Use  . Smoking status: Former Smoker    Packs/day: 0.50    Years: 25.00    Pack years: 12.50    Types: Cigarettes    Last attempt to quit: 12/19/1978    Years since quitting: 39.7  . Smokeless tobacco: Never Used  Substance Use Topics  . Alcohol use: Yes    Alcohol/week: 4.0 standard drinks    Types: 4 Glasses of wine per week    Family History  Problem Relation Age of Onset  . Heart disease Mother   . Prostate cancer Father   . Lung cancer Sister      Review of Systems  Musculoskeletal: Positive for joint pain.  All other systems reviewed and are negative.   Objective:  Physical Exam  Constitutional: She is oriented to person, place, and time. She appears well-developed and well-nourished.  HENT:  Head: Normocephalic and atraumatic.  Eyes: Pupils are equal, round, and reactive to light. EOM are normal.  Neck: Normal range of motion. Neck supple.  Cardiovascular: Normal rate and regular rhythm.  Respiratory: Effort normal and breath sounds normal.  GI: Soft. Bowel sounds are normal.  Musculoskeletal:       Left hip: She exhibits decreased range of motion, decreased strength, tenderness and bony tenderness.  Neurological: She is alert and oriented to person, place, and time.  Skin: Skin is warm and dry.  Psychiatric: She has a normal mood and affect.    Vital signs in last 24 hours:    Labs:   Estimated body mass index is 22.38 kg/m as calculated from the following:   Height as of 08/24/18: 5\' 4"  (1.626 m).   Weight as of 08/24/18: 59.1 kg.   Imaging Review Plain radiographs demonstrate severe degenerative joint disease of the left hip(s). The bone quality appears to be good for age and reported activity level.    Preoperative templating of the joint replacement has been completed, documented, and submitted to the  Operating Room personnel in order to optimize intra-operative equipment management.     Assessment/Plan:  End stage arthritis, left hip(s)  The patient history, physical examination, clinical judgement of the provider and imaging studies are consistent with end stage degenerative joint disease of the left hip(s) and total hip arthroplasty is deemed medically necessary. The treatment options including medical management, injection therapy, arthroscopy and arthroplasty were discussed at length. The risks and benefits of total hip arthroplasty were presented and reviewed. The risks due to aseptic loosening, infection, stiffness, dislocation/subluxation,  thromboembolic complications and other imponderables were discussed.  The patient acknowledged the explanation, agreed to proceed with the plan and consent was signed. Patient is being admitted for inpatient treatment for surgery, pain control, PT, OT, prophylactic antibiotics, VTE prophylaxis, progressive ambulation and ADL's and discharge  planning.The patient is planning to be discharged home with home health services

## 2018-08-31 NOTE — Care Plan (Signed)
Spoke with patient prior to surgery. She lives in the [facility] community and will discharge to the healthcare setting for rehab prior to returning to her independent apartment. They will assess and order appropriate DME at time of discharge from this setting. I have spoken with Lorie Phenix, admissions, and they are expecting her. She will need non emergent EMS transport if family cannot take her. The facility cannot transport   Please contact Ladell Heads, [place] with questions.

## 2018-08-31 NOTE — Transfer of Care (Signed)
Immediate Anesthesia Transfer of Care Note  Patient: [name] French  Procedure(s) Performed: LEFT TOTAL HIP ARTHROPLASTY ANTERIOR APPROACH (Left Hip)  Patient Location: PACU  Anesthesia Type:Spinal  Level of Consciousness: awake, alert  and patient cooperative  Airway & Oxygen Therapy: Patient Spontanous Breathing and Patient connected to face mask oxygen  Post-op Assessment: Report given to RN and Post -op Vital signs reviewed and stable  Post vital signs: Reviewed and stable  Last Vitals:  Vitals Value Taken Time  BP 92/54 08/31/2018 10:34 AM  Temp    Pulse 31 08/31/2018 10:35 AM  Resp 17 08/31/2018 10:38 AM  SpO2 82 % 08/31/2018 10:35 AM  Vitals shown include unvalidated device data.  Last Pain:  Vitals:   08/31/18 0709  TempSrc: Oral  PainSc: 4       Patients Stated Pain Goal: 3 (69/67/89 3810)  Complications: No apparent anesthesia complications

## 2018-09-01 LAB — CBC
HCT: 34 % — ABNORMAL LOW (ref 36.0–46.0)
Hemoglobin: 11.2 g/dL — ABNORMAL LOW (ref 12.0–15.0)
MCH: 29.1 pg (ref 26.0–34.0)
MCHC: 32.9 g/dL (ref 30.0–36.0)
MCV: 88.3 fL (ref 78.0–100.0)
PLATELETS: 188 10*3/uL (ref 150–400)
RBC: 3.85 MIL/uL — ABNORMAL LOW (ref 3.87–5.11)
RDW: 14.3 % (ref 11.5–15.5)
WBC: 7.7 10*3/uL (ref 4.0–10.5)

## 2018-09-01 LAB — BASIC METABOLIC PANEL
ANION GAP: 9 (ref 5–15)
BUN: 10 mg/dL (ref 8–23)
CO2: 26 mmol/L (ref 22–32)
CREATININE: 0.66 mg/dL (ref 0.44–1.00)
Calcium: 9.1 mg/dL (ref 8.9–10.3)
Chloride: 106 mmol/L (ref 98–111)
GLUCOSE: 139 mg/dL — AB (ref 70–99)
Potassium: 4.5 mmol/L (ref 3.5–5.1)
Sodium: 141 mmol/L (ref 135–145)

## 2018-09-01 NOTE — Progress Notes (Signed)
Physical Therapy Treatment Patient Details Name: [name] French MRN: [id number] DOB: Jul 14, 1926 Today's Date: 09/01/2018    History of Present Illness Pt is a 84 YO female s/p L DA-THA on 08/31/18. PMH includes L pleural effusion 07/11/18, lung cancer 2018, breast cancer 2014. Past surgical history includes breast lumpectomy, cataracts.     PT Comments    Pt motivated and progressing well with mobility.   Follow Up Recommendations  Follow surgeon's recommendation for DC plan and follow-up therapies;Supervision for mobility/OOB     Equipment Recommendations  Rolling walker with 5" wheels    Recommendations for Other Services       Precautions / Restrictions Precautions Precautions: Fall Restrictions Weight Bearing Restrictions: No Other Position/Activity Restrictions: WBAT     Mobility  Bed Mobility Overal bed mobility: Needs Assistance Bed Mobility: Supine to Sit     Supine to sit: Min assist     General bed mobility comments: cues  for sequence and use of R LE to self assist  Transfers Overall transfer level: Needs assistance Equipment used: Rolling walker (2 wheeled) Transfers: Sit to/from Stand Sit to Stand: Min assist         General transfer comment: cues for LE management and use of UEs to self assist  Ambulation/Gait Ambulation/Gait assistance: Min assist;Min guard Gait Distance (Feet): 200 Feet Assistive device: Rolling walker (2 wheeled) Gait Pattern/deviations: Step-to pattern;Step-through pattern;Decreased step length - right;Decreased step length - left;Shuffle;Trunk flexed Gait velocity: decr    General Gait Details: cues for posture, position from RW and initial sequence   Stairs             Wheelchair Mobility    Modified Rankin (Stroke Patients Only)       Balance                                            Cognition Arousal/Alertness: Awake/alert Behavior During Therapy: WFL for tasks  assessed/performed Overall Cognitive Status: Within Functional Limits for tasks assessed                                        Exercises Total Joint Exercises Ankle Circles/Pumps: AROM;Both;Seated;15 reps Quad Sets: AROM;Left;Supine;10 reps Heel Slides: AAROM;Left;Supine;20 reps Hip ABduction/ADduction: AAROM;Left;15 reps;Supine    General Comments        Pertinent Vitals/Pain Pain Assessment: 0-10 Pain Score: 4  Pain Location: L hip Pain Descriptors / Indicators: Sore Pain Intervention(s): Limited activity within patient's tolerance;Monitored during session;Premedicated before session;Ice applied    Home Living Family/patient expects to be discharged to:: Assisted living([facility]; Pt to be d/c to SNF part of community) Living Arrangements: Alone           Home Equipment: Cane - single point      Prior Function Level of Independence: Independent          PT Goals (current goals can now be found in the care plan section) Acute Rehab PT Goals Patient Stated Goal: to return to independence. PT Goal Formulation: With patient Time For Goal Achievement: 09/07/18 Potential to Achieve Goals: Good Progress towards PT goals: Progressing toward goals    Frequency    7X/week      PT Plan Current plan remains appropriate    Co-evaluation  AM-PAC PT "6 Clicks" Daily Activity  Outcome Measure  Difficulty turning over in bed (including adjusting bedclothes, sheets and blankets)?: Unable Difficulty moving from lying on back to sitting on the side of the bed? : Unable Difficulty sitting down on and standing up from a chair with arms (e.g., wheelchair, bedside commode, etc,.)?: Unable Help needed moving to and from a bed to chair (including a wheelchair)?: A Little Help needed walking in hospital room?: A Little Help needed climbing 3-5 steps with a railing? : A Little 6 Click Score: 12    End of Session  Equipment Utilized During Treatment: Gait belt Activity Tolerance: Patient tolerated treatment well Patient left: Other (comment)(standing with OT) Nurse Communication: Mobility status PT Visit Diagnosis: Unsteadiness on feet (R26.81);Difficulty in walking, not elsewhere classified (R26.2)     Time: 1008-1040 PT Time Calculation (min) (ACUTE ONLY): 32 min  Charges:  $Gait Training: 8-22 mins $Therapeutic Exercise: 8-22 mins                     [place] Pager [phone] Office [phone]    Najae Rathert 09/01/2018, 12:54 PM

## 2018-09-01 NOTE — Clinical Social Work Note (Signed)
Clinical Social Work Assessment  Patient Details  Name: [name] French MRN: [id number] Date of Birth: [birth year]  Date of referral:  08/31/18               Reason for consult:  Facility Placement                Permission sought to share information with:  Facility Art therapist granted to share information::  Yes, Verbal Permission Granted  Name::     Cammie Christoper Fabian  Agency::  [facility]  Relationship::  Daughter  Contact Information:  [phone]  Housing/Transportation Living arrangements for the past 2 months:  [place] of Information:  Patient, Adult Children Patient Interpreter Needed:  None Criminal Activity/Legal Involvement Pertinent to Current Situation/Hospitalization:  No - Comment as needed Significant Relationships:  Adult Children Lives with:  Self Do you feel safe going back to the place where you live?  Yes Need for family participation in patient care:  No (Coment)  Care giving concerns:  Patient lives at [place] which offers a community of support. Patient is currently physically limited currently due to hip surgery.   Social Worker assessment / plan:  CSW met with patient and daughter, Cammie, at bedside to discuss role and discharge plans. Patient lives at [place] and plans to discharge to their rehab facility. Patient has already made [facility] aware of her surgery and impending discharge to their therapy program. PTA, patient was managing well at home although she had high levels of pain. Patient has supportive family and daughter lives ~15 mins away.   Patient stated she would like the [place] wheelchair accessible [facility]i to pick her up at discharge and for CSW to coordinate this with facility. CSW told patient we would speak with [facility] about transportation options. Patient's daughter stated she would also be able to transport her.  CSW will coordinate with [facility] for patient's  discharge. Plan for d/c Monday.   Employment status:  Retired Forensic scientist:  Medicare PT Recommendations:  [place] / Referral to community resources:  [place]  Patient/Family's Response to care:  Patient and family report positive experience thus far and know patient will benefit from rehab at d/c.  Patient/Family's Understanding of and Emotional Response to Diagnosis, Current Treatment, and Prognosis:  Patient and family understand SNF process and know patient will be returning to [facility] for rehab. They are appreciative of CSW discussion and look forward to patient regaining strength at in rehab.  Emotional Assessment Appearance:  Appears stated age Attitude/Demeanor/Rapport:    Affect (typically observed):  Appropriate, Pleasant Orientation:  Oriented to Place, Oriented to Self, Oriented to  Time, Oriented to Situation Alcohol / Substance use:  Not Applicable Psych involvement (Current and /or in the community):  No (Comment)  Discharge Needs  Concerns to be addressed:  Care Coordination Readmission within the last 30 days:  No Current discharge risk:  Physical Impairment Barriers to Discharge:  Continued Medical Work up   [employer], [place] 09/01/2018, 2:36 PM

## 2018-09-01 NOTE — Plan of Care (Signed)
  Problem: Education: Goal: Knowledge of General Education information will improve Description Including pain rating scale, medication(s)/side effects and non-pharmacologic comfort measures Outcome: Progressing   Problem: Clinical Measurements: Goal: Will remain free from infection Outcome: Progressing Goal: Respiratory complications will improve Outcome: Progressing   Problem: Coping: Goal: Level of anxiety will decrease Outcome: Progressing   Problem: Pain Managment: Goal: General experience of comfort will improve Outcome: Progressing   Problem: Education: Goal: Knowledge of the prescribed therapeutic regimen will improve Outcome: Progressing   Problem: Pain Management: Goal: Pain level will decrease with appropriate interventions Outcome: Progressing   Carlynn Herald, RN 09/01/18 1:43 AM

## 2018-09-01 NOTE — Evaluation (Signed)
Occupational Therapy Evaluation Patient Details Name: [name] French MRN: [id number] DOB: Feb 22, 1926 Today's Date: 09/01/2018    History of Present Illness Pt is a 84 YO female s/p L DA-THA on 08/31/18. PMH includes L pleural effusion 07/11/18, lung cancer 2018, breast cancer 2014. Past surgical history includes breast lumpectomy, cataracts.    Clinical Impression   Pt up to the bathroom to perform grooming tasks with OT and tolerated well. She is planning SNF at d/c at [facility]. Pt will benefit from OT to increase ADL independence and safety.  Will continue to follow to progress ADL independence for next venue.    Follow Up Recommendations  SNF;Supervision/Assistance - 24 hour    Equipment Recommendations  Other (comment)(defer to SNF)    Recommendations for Other Services       Precautions / Restrictions Precautions Precautions: Fall Restrictions Weight Bearing Restrictions: No Other Position/Activity Restrictions: WBAT       Mobility Bed Mobility               General bed mobility comments: pt with PT when OT arrived and OT assisted to bathroom.  Transfers Overall transfer level: Needs assistance Equipment used: Rolling walker (2 wheeled)             General transfer comment: stand to sit in recliner with min assist and cues for hand placement and LE management.    Balance                                           ADL either performed or assessed with clinical judgement   ADL Overall ADL's : Needs assistance/impaired Eating/Feeding: Independent;Sitting   Grooming: Minimal assistance;Standing   Upper Body Bathing: Set up;Sitting   Lower Body Bathing: Moderate assistance;Sit to/from stand   Upper Body Dressing : Set up;Sitting   Lower Body Dressing: Moderate assistance;Sit to/from stand   Toilet Transfer: Minimal assistance;Ambulation;RW   Toileting- Clothing Manipulation and Hygiene: Minimal assistance;Sit to/from stand          General ADL Comments: Pt stood to perform grooming at the sink including brushing her teeth and applying some makeup with good tolerance. She needed min cues for safety with walker especially for turns to exit bathroom safely with walker. She also needs cues for hand placement on recliner to help with descending to chair.      Vision Patient Visual Report: No change from baseline       Perception     Praxis      Pertinent Vitals/Pain Pain Score: 4  Pain Location: L hip Pain Descriptors / Indicators: Sore Pain Intervention(s): Monitored during session;Ice applied     Hand Dominance Right   Extremity/Trunk Assessment Upper Extremity Assessment Upper Extremity Assessment: Overall WFL for tasks assessed           Communication Communication Communication: HOH   Cognition Arousal/Alertness: Awake/alert Behavior During Therapy: WFL for tasks assessed/performed Overall Cognitive Status: Within Functional Limits for tasks assessed                                     General Comments       Exercises     Shoulder Instructions      Home Living Family/patient expects to be discharged to:: Assisted living([facility]; Pt to be d/c to SNF  part of community) Living Arrangements: Alone                           Home Equipment: Cane - single point          Prior Functioning/Environment Level of Independence: Independent                 OT Problem List: Decreased strength;Decreased knowledge of use of DME or AE      OT Treatment/Interventions: Self-care/ADL training;DME and/or AE instruction;Patient/family education;Therapeutic activities    OT Goals(Current goals can be found in the care plan section) Acute Rehab OT Goals Patient Stated Goal: to return to independence. OT Goal Formulation: With patient Time For Goal Achievement: 09/08/18 Potential to Achieve Goals: Good  OT Frequency: Min 2X/week    Barriers to D/C:            Co-evaluation              AM-PAC PT "6 Clicks" Daily Activity     Outcome Measure Help from another person eating meals?: None Help from another person taking care of personal grooming?: A Little Help from another person toileting, which includes using toliet, bedpan, or urinal?: A Little Help from another person bathing (including washing, rinsing, drying)?: A Little Help from another person to put on and taking off regular upper body clothing?: None Help from another person to put on and taking off regular lower body clothing?: A Lot 6 Click Score: 19   End of Session Equipment Utilized During Treatment: Rolling walker  Activity Tolerance: Patient tolerated treatment well Patient left: in chair;with call bell/phone within reach;with chair alarm set  OT Visit Diagnosis: Unsteadiness on feet (R26.81);Muscle weakness (generalized) (M62.81)                Time: 1040-1059 OT Time Calculation (min): 19 min Charges:  OT General Charges $OT Visit: 1 Visit OT Evaluation $OT Eval Low Complexity: 1 Low    Philippa Chester OTR/L Acute Rehabilitation Services [phone] 09/01/2018, 12:32 PM

## 2018-09-01 NOTE — Progress Notes (Signed)
Subjective: 1 Day Post-Op Procedure(s) (LRB): LEFT TOTAL HIP ARTHROPLASTY ANTERIOR APPROACH (Left) Patient reports pain as moderate.    Objective: Vital signs in last 24 hours: Temp:  [97.5 F (36.4 C)-98.2 F (36.8 C)] 98.1 F (36.7 C) (09/14 0915) Pulse Rate:  [85-114] 104 (09/14 0915) Resp:  [14-23] 14 (09/14 0915) BP: (92-143)/(51-85) 94/51 (09/14 0915) SpO2:  [92 %-100 %] 92 % (09/14 0915)  Intake/Output from previous day: 09/13 0701 - 09/14 0700 In: 4073.7 [P.O.:860; I.V.:3113.7; IV Piggyback:100] Out: 1925 [Urine:1825; Blood:100] Intake/Output this shift: Total I/O In: 240 [P.O.:240] Out: 600 [Urine:600]  Recent Labs    09/01/18 0354  HGB 11.2*   Recent Labs    09/01/18 0354  WBC 7.7  RBC 3.85*  HCT 34.0*  PLT 188   Recent Labs    09/01/18 0354  NA 141  K 4.5  CL 106  CO2 26  BUN 10  CREATININE 0.66  GLUCOSE 139*  CALCIUM 9.1   No results for input(s): LABPT, INR in the last 72 hours.  Sensation intact distally Intact pulses distally Dorsiflexion/Plantar flexion intact Incision: scant drainage  Assessment/Plan: 1 Day Post-Op Procedure(s) (LRB): LEFT TOTAL HIP ARTHROPLASTY ANTERIOR APPROACH (Left) Up with therapy Discharge to [facility] Monday.    Mcarthur Rossetti 09/01/2018, 10:25 AM

## 2018-09-01 NOTE — Progress Notes (Signed)
Physical Therapy Treatment Patient Details Name: [name] French MRN: [id number] DOB: 1926/10/24 Today's Date: 09/01/2018    History of Present Illness Pt is a 84 YO female s/p L DA-THA on 08/31/18. PMH includes L pleural effusion 07/11/18, lung cancer 2018, breast cancer 2014. Past surgical history includes breast lumpectomy, cataracts.     PT Comments    Pt motivated and progressing well with mobility.   Follow Up Recommendations  Follow surgeon's recommendation for DC plan and follow-up therapies;Supervision for mobility/OOB     Equipment Recommendations  Rolling walker with 5" wheels    Recommendations for Other Services       Precautions / Restrictions Precautions Precautions: Fall Restrictions Weight Bearing Restrictions: No Other Position/Activity Restrictions: WBAT     Mobility  Bed Mobility Overal bed mobility: Needs Assistance Bed Mobility: Sit to Supine     Supine to sit: Min assist Sit to supine: Min assist   General bed mobility comments: cues  for sequence and use of R LE to self assist  Transfers Overall transfer level: Needs assistance Equipment used: Rolling walker (2 wheeled) Transfers: Sit to/from Stand Sit to Stand: Min assist;Min guard         General transfer comment: cues for LE management and use of UEs to self assist  Ambulation/Gait Ambulation/Gait assistance: Min assist;Min guard Gait Distance (Feet): 250 Feet Assistive device: Rolling walker (2 wheeled) Gait Pattern/deviations: Step-to pattern;Step-through pattern;Decreased step length - right;Decreased step length - left;Shuffle;Trunk flexed Gait velocity: decr    General Gait Details: cues for posture, position from RW and initial sequence   Stairs             Wheelchair Mobility    Modified Rankin (Stroke Patients Only)       Balance Overall balance assessment: Mild deficits observed, not formally tested                                           Cognition Arousal/Alertness: Awake/alert Behavior During Therapy: WFL for tasks assessed/performed Overall Cognitive Status: Within Functional Limits for tasks assessed                                        Exercises Total Joint Exercises Ankle Circles/Pumps: AROM;Both;Seated;15 reps Quad Sets: AROM;Left;Supine;10 reps Heel Slides: AAROM;Left;Supine;20 reps Hip ABduction/ADduction: AAROM;Left;15 reps;Supine    General Comments        Pertinent Vitals/Pain Pain Assessment: 0-10 Pain Score: 4  Pain Location: L hip Pain Descriptors / Indicators: Sore Pain Intervention(s): Limited activity within patient's tolerance;Ice applied;Monitored during session    Home Living Family/patient expects to be discharged to:: Assisted living([facility]; Pt to be d/c to SNF part of community) Living Arrangements: Alone           Home Equipment: Cane - single point      Prior Function Level of Independence: Independent          PT Goals (current goals can now be found in the care plan section) Acute Rehab PT Goals Patient Stated Goal: to return to independence. PT Goal Formulation: With patient Time For Goal Achievement: 09/07/18 Potential to Achieve Goals: Good Progress towards PT goals: Progressing toward goals    Frequency    7X/week      PT Plan Current plan remains appropriate  Co-evaluation              AM-PAC PT "6 Clicks" Daily Activity  Outcome Measure  Difficulty turning over in bed (including adjusting bedclothes, sheets and blankets)?: Unable Difficulty moving from lying on back to sitting on the side of the bed? : Unable Difficulty sitting down on and standing up from a chair with arms (e.g., wheelchair, bedside commode, etc,.)?: Unable Help needed moving to and from a bed to chair (including a wheelchair)?: A Little Help needed walking in hospital room?: A Little Help needed climbing 3-5 steps with a  railing? : A Little 6 Click Score: 12    End of Session Equipment Utilized During Treatment: Gait belt Activity Tolerance: Patient tolerated treatment well Patient left: Other (comment)(standing with OT) Nurse Communication: Mobility status PT Visit Diagnosis: Unsteadiness on feet (R26.81);Difficulty in walking, not elsewhere classified (R26.2)     Time: 9150-4136 PT Time Calculation (min) (ACUTE ONLY): 20 min  Charges:  $Gait Training: 8-22 mins $Therapeutic Exercise: 8-22 mins                     [place] Pager [phone] Office [phone]    Astra Gregg 09/01/2018, 4:18 PM

## 2018-09-02 NOTE — Progress Notes (Signed)
Physical Therapy Treatment Patient Details Name: [name] French MRN: [id number] DOB: 1926/07/23 Today's Date: 09/02/2018    History of Present Illness Pt is a 84 YO female s/p L DA-THA on 08/31/18. PMH includes L pleural effusion 07/11/18, lung cancer 2018, breast cancer 2014. Past surgical history includes breast lumpectomy, cataracts.     PT Comments    Pt ambulated in hallway and performed LE exercises.  Pt progressing well with mobility and anticipates d/c tomorrow.   Follow Up Recommendations  Follow surgeon's recommendation for DC plan and follow-up therapies;Supervision for mobility/OOB     Equipment Recommendations  Rolling walker with 5" wheels    Recommendations for Other Services       Precautions / Restrictions Precautions Precautions: Fall Restrictions Other Position/Activity Restrictions: WBAT     Mobility  Bed Mobility Overal bed mobility: Needs Assistance Bed Mobility: Supine to Sit     Supine to sit: Min guard     General bed mobility comments: verbal cues for self assist  Transfers Overall transfer level: Needs assistance Equipment used: Rolling walker (2 wheeled) Transfers: Sit to/from Stand Sit to Stand: Min guard         General transfer comment: verbal cues for UE and LE positioning  Ambulation/Gait Ambulation/Gait assistance: Min guard Gait Distance (Feet): 240 Feet Assistive device: Rolling walker (2 wheeled) Gait Pattern/deviations: Step-through pattern;Trunk flexed;Decreased stride length     General Gait Details: verbal cues for RW positioning and posture   Stairs             Wheelchair Mobility    Modified Rankin (Stroke Patients Only)       Balance                                            Cognition Arousal/Alertness: Awake/alert Behavior During Therapy: WFL for tasks assessed/performed Overall Cognitive Status: Within Functional Limits for tasks assessed                                         Exercises Total Joint Exercises Ankle Circles/Pumps: AROM;Both;15 reps Quad Sets: AROM;Left;Supine;10 reps Heel Slides: AAROM;Left;Supine;15 reps Hip ABduction/ADduction: AAROM;Left;15 reps;Supine Long Arc Quad: AROM;10 reps;Seated;Left    General Comments        Pertinent Vitals/Pain Pain Assessment: 0-10 Pain Score: 4  Pain Location: L hip Pain Descriptors / Indicators: Sore Pain Intervention(s): Limited activity within patient's tolerance;Repositioned;Monitored during session;Ice applied    Home Living                      Prior Function            PT Goals (current goals can now be found in the care plan section) Progress towards PT goals: Progressing toward goals    Frequency    7X/week      PT Plan Current plan remains appropriate    Co-evaluation              AM-PAC PT "6 Clicks" Daily Activity  Outcome Measure  Difficulty turning over in bed (including adjusting bedclothes, sheets and blankets)?: A Lot Difficulty moving from lying on back to sitting on the side of the bed? : Unable Difficulty sitting down on and standing up from a chair with arms (e.g., wheelchair, bedside commode,  etc,.)?: Unable Help needed moving to and from a bed to chair (including a wheelchair)?: A Little Help needed walking in hospital room?: A Little Help needed climbing 3-5 steps with a railing? : A Little 6 Click Score: 13    End of Session   Activity Tolerance: Patient tolerated treatment well Patient left: with call bell/phone within reach;in chair;with chair alarm set Nurse Communication: Mobility status PT Visit Diagnosis: Unsteadiness on feet (R26.81);Difficulty in walking, not elsewhere classified (R26.2)     Time: 1245-8099 PT Time Calculation (min) (ACUTE ONLY): 23 min  Charges:  $Gait Training: 8-22 mins $Therapeutic Exercise: 8-22 mins                     Carmelia Bake, PT, DPT Acute Rehabilitation Services Office:  [phone] Pager: [phone]  Trena Platt 09/02/2018, 12:50 PM

## 2018-09-02 NOTE — Progress Notes (Signed)
Subjective: 2 Days Post-Op Procedure(s) (LRB): LEFT TOTAL HIP ARTHROPLASTY ANTERIOR APPROACH (Left) Patient reports pain as mild.  Patient doing excellent.  Mobilizing very well with PT  Objective: Vital signs in last 24 hours: Temp:  [97.6 F (36.4 C)-98.3 F (36.8 C)] 97.6 F (36.4 C) (09/15 0539) Pulse Rate:  [82-104] 82 (09/15 0539) Resp:  [14-16] 16 (09/15 0539) BP: (94-116)/(51-68) 110/68 (09/15 0539) SpO2:  [92 %-99 %] 93 % (09/15 0539)  Intake/Output from previous day: 09/14 0701 - 09/15 0700 In: 1471.8 [P.O.:880; I.V.:591.8] Out: 3000 [Urine:3000] Intake/Output this shift: No intake/output data recorded.  Recent Labs    09/01/18 0354  HGB 11.2*   Recent Labs    09/01/18 0354  WBC 7.7  RBC 3.85*  HCT 34.0*  PLT 188   Recent Labs    09/01/18 0354  NA 141  K 4.5  CL 106  CO2 26  BUN 10  CREATININE 0.66  GLUCOSE 139*  CALCIUM 9.1   No results for input(s): LABPT, INR in the last 72 hours.  Neurologically intact Neurovascular intact Sensation intact distally Intact pulses distally Dorsiflexion/Plantar flexion intact Incision: scant drainage No cellulitis present Compartment soft    Assessment/Plan: 2 Days Post-Op Procedure(s) (LRB): LEFT TOTAL HIP ARTHROPLASTY ANTERIOR APPROACH (Left) Advance diet Up with therapy Plan for discharge tomorrow to SNF WBAT LLE- no precautions ABLA-mild and stable     [name] French 09/02/2018, 8:18 AM

## 2018-09-02 NOTE — NC FL2 (Signed)
[place] MEDICAID FL2 LEVEL OF CARE SCREENING TOOL     IDENTIFICATION  Patient Name: [name] French Birthdate: 1926-12-14 Sex: female Admission Date (Current Location): 08/31/2018  [facility] and [place] Number:  Herbalist and Address:  [facility],  [address] [place], [place]      Provider Number: [id number]  Attending Physician Name and Address:  Mcarthur Rossetti,*  Relative Name and Phone Number:  Zada Finders: [phone]    Current Level of Care: SNF Recommended Level of Care: [place] Prior Approval Number:    Date Approved/Denied:   PASRR Number:    Discharge Plan: SNF    Current Diagnoses: Patient Active Problem List   Diagnosis Date Noted  . Status post total replacement of left hip 08/31/2018  . Unilateral primary osteoarthritis, left hip 08/06/2018  . Pleural effusion on left 07/11/2018  . Chest pain 12/25/2017  . Abnormal findings on radiological examination of gastrointestinal tract 11/16/2017  . Primary cancer of left lower lobe of lung ([place]) 11/15/2017  . Non-small cell cancer of right lung ([place]) 11/07/2017  . Lung mass 10/17/2017  . Breast cancer of upper-inner quadrant of right female breast ([place]) 06/10/2013  . Mass of breast, left 05/31/2013  . PERSONAL HISTORY OF COLONIC POLYPS 07/26/2010    Orientation RESPIRATION BLADDER Height & Weight     Self, Time, Situation, Place  Normal Continent Weight: 130 lb 6 oz (59.1 kg) Height:  5\' 4"  (162.6 cm)  BEHAVIORAL SYMPTOMS/MOOD NEUROLOGICAL BOWEL NUTRITION STATUS      Continent Diet(Regular)  AMBULATORY STATUS COMMUNICATION OF NEEDS Skin   Limited Assist Verbally Surgical wounds(L hip surgical incision)                       Personal Care Assistance Level of Assistance  Bathing, Feeding, Dressing Bathing Assistance: Limited assistance Feeding assistance: Independent Dressing Assistance: Limited assistance     Functional Limitations  Info  Sight, Hearing, Speech Sight Info: Adequate Hearing Info: Adequate Speech Info: Adequate    SPECIAL CARE FACTORS FREQUENCY  PT (By licensed PT), OT (By licensed OT)     PT Frequency: 5x/week OT Frequency: 5x/week            Contractures Contractures Info: Not present    Additional Factors Info  Allergies, Code Status Code Status Info: Full Allergies Info: CODEINE, HYDROCODONE, NEOSPORIN NEOMYCIN-BACITRACIN ZN-POLYMYX            Current Medications (09/02/2018):  This is the current hospital active medication list Current Facility-Administered Medications  Medication Dose Route Frequency Provider Last Rate Last Dose  . 0.9 %  sodium chloride infusion   Intravenous Continuous Mcarthur Rossetti, MD   Stopped at 09/01/18 1641  . acetaminophen (TYLENOL) tablet 325-650 mg  325-650 mg Oral Q6H PRN Mcarthur Rossetti, MD   650 mg at 09/01/18 1642  . alum & mag hydroxide-simeth (MAALOX/MYLANTA) 200-200-20 MG/5ML suspension 30 mL  30 mL Oral Q4H PRN Mcarthur Rossetti, MD      . aspirin chewable tablet 81 mg  81 mg Oral BID Mcarthur Rossetti, MD   81 mg at 09/02/18 0936  . cholecalciferol (VITAMIN D) tablet 1,000 Units  1,000 Units Oral Daily Mcarthur Rossetti, MD   1,000 Units at 09/02/18 0934  . diphenhydrAMINE (BENADRYL) 12.5 MG/5ML elixir 12.5-25 mg  12.5-25 mg Oral Q4H PRN Mcarthur Rossetti, MD      . docusate sodium (COLACE) capsule 100 mg  100 mg Oral BID Mcarthur Rossetti, MD   100 mg at 09/02/18 0936  . famotidine (PEPCID) tablet 10 mg  10 mg Oral BID Mcarthur Rossetti, MD   10 mg at 09/02/18 0756  . loratadine (CLARITIN) tablet 10 mg  10 mg Oral Daily Mcarthur Rossetti, MD   10 mg at 09/02/18 0934  . menthol-cetylpyridinium (CEPACOL) lozenge 3 mg  1 lozenge Oral PRN Mcarthur Rossetti, MD       Or  . phenol (CHLORASEPTIC) mouth spray 1 spray  1 spray Mouth/Throat PRN Mcarthur Rossetti, MD      .  methocarbamol (ROBAXIN) tablet 500 mg  500 mg Oral Q6H PRN Mcarthur Rossetti, MD   500 mg at 09/02/18 0007   Or  . methocarbamol (ROBAXIN) 500 mg in dextrose 5 % 50 mL IVPB  500 mg Intravenous Q6H PRN Mcarthur Rossetti, MD      . metoCLOPramide (REGLAN) tablet 5-10 mg  5-10 mg Oral Q8H PRN Mcarthur Rossetti, MD       Or  . metoCLOPramide (REGLAN) injection 5-10 mg  5-10 mg Intravenous Q8H PRN Mcarthur Rossetti, MD      . metoprolol tartrate (LOPRESSOR) tablet 12.5 mg  12.5 mg Oral BID Mcarthur Rossetti, MD   12.5 mg at 09/02/18 0934  . morphine 2 MG/ML injection 0.5-1 mg  0.5-1 mg Intravenous Q2H PRN Mcarthur Rossetti, MD      . naphazoline-pheniramine (NAPHCON-A) 0.025-0.3 % ophthalmic solution 1-2 drop  1-2 drop Both Eyes PRN Mcarthur Rossetti, MD      . nitroGLYCERIN (NITROSTAT) SL tablet 0.4 mg  0.4 mg Sublingual Q5 min PRN Mcarthur Rossetti, MD      . ondansetron [facility]) tablet 4 mg  4 mg Oral Q6H PRN Mcarthur Rossetti, MD       Or  . ondansetron [facility]) injection 4 mg  4 mg Intravenous Q6H PRN Mcarthur Rossetti, MD      . oxyCODONE (Oxy IR/ROXICODONE) immediate release tablet 5-10 mg  5-10 mg Oral Q4H PRN Pete Pelt, PA-C   10 mg at 09/02/18 0007  . polyethylene glycol (MIRALAX / GLYCOLAX) packet 17 g  17 g Oral Daily PRN Mcarthur Rossetti, MD   17 g at 09/02/18 0757  . pravastatin (PRAVACHOL) tablet 40 mg  40 mg Oral QHS Mcarthur Rossetti, MD   40 mg at 09/01/18 2211  . traMADol (ULTRAM) tablet 100 mg  100 mg Oral Q8H PRN Mcarthur Rossetti, MD   100 mg at 08/31/18 1244  . zolpidem (AMBIEN) tablet 5 mg  5 mg Oral QHS PRN Mcarthur Rossetti, MD   5 mg at 09/01/18 2213     Discharge Medications: Please see discharge summary for a list of discharge medications.  Relevant Imaging Results:  Relevant Lab Results:   Additional Information SSN: [id number]  Pricilla Holm, [place]

## 2018-09-03 ENCOUNTER — Encounter (HOSPITAL_COMMUNITY): Payer: Self-pay | Admitting: Orthopaedic Surgery

## 2018-09-03 MED ORDER — OXYCODONE HCL 5 MG PO TABS: 5.0000 mg | ORAL_TABLET | ORAL | 0 refills | Status: DC | PRN

## 2018-09-03 MED ORDER — ASPIRIN 81 MG PO CHEW: 81.0000 mg | CHEWABLE_TABLET | Freq: Two times a day (BID) | ORAL | 0 refills | Status: DC

## 2018-09-03 MED ORDER — METHOCARBAMOL 500 MG PO TABS: 500.0000 mg | ORAL_TABLET | Freq: Four times a day (QID) | ORAL | 0 refills | Status: DC | PRN

## 2018-09-03 NOTE — Progress Notes (Signed)
Pt's heart rate tachy. Pt reports it has been this way since her lung cancer diagnosis last year. "This is why I take metoprolol." Pt reports no new symptoms. No acute changes this hospitalization.

## 2018-09-03 NOTE — Progress Notes (Signed)
Patient ID: [name] French, female   DOB: [birth year], 84 y.o.   MRN: [id number] No acute changes.  Slightly tachy, but vitals otherwise stable.  Reports feeling fine.  Hip is stable.  Can be discharged to skilled nursing today.

## 2018-09-03 NOTE — Care Management Important Message (Signed)
Important Message  Patient Details  Name: [name] French MRN: [id number] Date of Birth: [birth year]   Medicare Important Message Given:  Yes    Kerin Salen 09/03/2018, 12:21 [place] Message  Patient Details  Name: [name] French MRN: [id number] Date of Birth: [birth year]   Medicare Important Message Given:  Yes    Kerin Salen 09/03/2018, 12:20 PM

## 2018-09-03 NOTE — Plan of Care (Signed)

## 2018-09-03 NOTE — Discharge Instructions (Signed)

## 2018-09-03 NOTE — Progress Notes (Signed)
Physical Therapy Treatment Patient Details Name: [name] French MRN: [id number] DOB: Oct 22, 1926 Today's Date: 09/03/2018    History of Present Illness Pt is a 84 YO female s/p L DA-THA on 08/31/18. PMH includes L pleural effusion 07/11/18, lung cancer 2018, breast cancer 2014. Past surgical history includes breast lumpectomy, cataracts.     PT Comments    Pt ambulated in hallway and performed LE exercises.  Pt plans to d/c to SNF today.  Follow Up Recommendations  Follow surgeon's recommendation for DC plan and follow-up therapies;Supervision for mobility/OOB     Equipment Recommendations  Rolling walker with 5" wheels    Recommendations for Other Services       Precautions / Restrictions Precautions Precautions: Fall Restrictions Other Position/Activity Restrictions: WBAT     Mobility  Bed Mobility Overal bed mobility: Needs Assistance Bed Mobility: Supine to Sit     Supine to sit: Supervision        Transfers Overall transfer level: Needs assistance Equipment used: Rolling walker (2 wheeled) Transfers: Sit to/from Stand Sit to Stand: Min guard         General transfer comment: verbal cues for UE and LE positioning  Ambulation/Gait Ambulation/Gait assistance: Min guard;Supervision Gait Distance (Feet): 240 Feet Assistive device: Rolling walker (2 wheeled) Gait Pattern/deviations: Step-through pattern;Trunk flexed;Decreased stride length Gait velocity: decr    General Gait Details: verbal cues for RW positioning and posture   Stairs             Wheelchair Mobility    Modified Rankin (Stroke Patients Only)       Balance                                            Cognition Arousal/Alertness: Awake/alert Behavior During Therapy: WFL for tasks assessed/performed Overall Cognitive Status: Within Functional Limits for tasks assessed                                        Exercises Total Joint  Exercises Quad Sets: AROM;Left;Supine;10 reps Heel Slides: AAROM;Left;Supine;15 reps Hip ABduction/ADduction: AAROM;Left;15 reps;Supine Long Arc Quad: AROM;10 reps;Seated;Left    General Comments        Pertinent Vitals/Pain Pain Assessment: 0-10 Pain Score: 3  Pain Location: L hip Pain Descriptors / Indicators: Sore Pain Intervention(s): Limited activity within patient's tolerance;Repositioned;Monitored during session    Home Living                      Prior Function            PT Goals (current goals can now be found in the care plan section) Progress towards PT goals: Progressing toward goals    Frequency    7X/week      PT Plan Current plan remains appropriate    Co-evaluation              AM-PAC PT "6 Clicks" Daily Activity  Outcome Measure  Difficulty turning over in bed (including adjusting bedclothes, sheets and blankets)?: A Little Difficulty moving from lying on back to sitting on the side of the bed? : A Lot Difficulty sitting down on and standing up from a chair with arms (e.g., wheelchair, bedside commode, etc,.)?: Unable Help needed moving to and from a bed to chair (including  a wheelchair)?: A Little Help needed walking in hospital room?: A Little Help needed climbing 3-5 steps with a railing? : A Little 6 Click Score: 15    End of Session   Activity Tolerance: Patient tolerated treatment well Patient left: with call bell/phone within reach;in chair Nurse Communication: Mobility status PT Visit Diagnosis: Difficulty in walking, not elsewhere classified (R26.2)     Time: 3612-2449 PT Time Calculation (min) (ACUTE ONLY): 18 min  Charges:  $Gait Training: 8-22 mins                     Carmelia Bake, PT, DPT Acute Rehabilitation Services Office: [phone] Pager: [phone]  Trena Platt 09/03/2018, 12:45 PM

## 2018-09-03 NOTE — Clinical Social Work Placement (Signed)
D/C summary sent.  [facility] to transport.   CLINICAL SOCIAL WORK PLACEMENT  NOTE  Date:  09/03/2018  Patient Details  Name: [name] French MRN: [id number] Date of Birth: 1926-10-23  Clinical Social Work is seeking post-discharge placement for this patient at the [place] level of care (*CSW will initial, date and re-position this form in  chart as items are completed):  Yes   Patient/family provided with [place] Work Department's list of facilities offering this level of care within the geographic area requested by the patient (or if unable, by the patient's family).  Yes   Patient/family informed of their freedom to choose among providers that offer the needed level of care, that participate in Medicare, Medicaid or managed care program needed by the patient, have an available bed and are willing to accept the patient.  Yes   Patient/family informed of [facility]'s ownership interest in [facility] and [facility], as well as of the fact that they are under no obligation to receive care at these facilities.  PASRR submitted to EDS on       PASRR number received on       Existing PASRR number confirmed on       FL2 transmitted to all facilities in geographic area requested by pt/family on       FL2 transmitted to all facilities within larger geographic area on       Patient informed that his/her managed care company has contracts with or will negotiate with certain facilities, including the following:  [facility]     Yes   Patient/family informed of bed offers received.  Patient chooses bed at [facility]     Physician recommends and patient chooses bed at      Patient to be transferred to [facility] on 09/03/18.  Patient to be transferred to facility by [facility].      Patient family notified on 09/03/18 of transfer.  Name of family member notified:  Patient to notify daughter.       PHYSICIAN       Additional Comment:    _______________________________________________ Lia Hopping, LCSW 09/03/2018, 10:39 AM

## 2018-09-03 NOTE — Discharge Summary (Signed)
Patient ID: [name] French MRN: [id number] DOB/AGE: [birth year] 84 y.o.  Admit date: 08/31/2018 Discharge date: 09/03/2018  Admission Diagnoses:  Principal Problem:   Unilateral primary osteoarthritis, left hip Active Problems:   Status post total replacement of left hip   Discharge Diagnoses:  Same  Past Medical History:  Diagnosis Date  . Allergic rhinitis   . Anemia    during pregnancy  . Arthritis   . Breast cancer ([place]) 05/08/13   right upper inner, invasive mammary  . Breast cancer, right breast ([place]) 04/16/2013   Underwent lumpectomy on 11/06/13. Path showed G1 ILC, 2.7 cm, neg margins, receptor+, Her2neg   . Diverticulosis   . 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 Hemorrhoids   . Hx of adenomatous colonic polyps 05/2002  . Lung cancer ([place])   . Lung cancer ([place]) 10/2017  . Meningioma ([place])   . Osteoporosis   . Pneumonia    walking pneumonia  . Rosacea   . 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 Use of letrozole ([place])    neoadjuvant antiestrogen therapy with letrozole 2.5 mg daily x 7 monhts    Surgeries: Procedure(s): LEFT TOTAL HIP ARTHROPLASTY ANTERIOR APPROACH on 08/31/2018   Consultants:   Discharged Condition: Improved  Hospital Course: ARIELE VIDRIO is an 84 y.o. female who was admitted 08/31/2018 for operative treatment ofUnilateral primary osteoarthritis, left hip. Patient has severe unremitting French that affects sleep, daily activities, and work/hobbies. After pre-op clearance the patient was taken to the operating room on 08/31/2018 and underwent  Procedure(s): LEFT TOTAL HIP ARTHROPLASTY ANTERIOR APPROACH.    Patient was given perioperative antibiotics:  Anti-infectives (From admission, onward)   Start     Dose/Rate Route Frequency Ordered Stop   08/31/18 1400  ceFAZolin (ANCEF) IVPB 1 g/50 mL premix     1 g 100 mL/hr over 30 Minutes Intravenous Every 6 hours 08/31/18 1128 08/31/18 2131   08/31/18 0715  ceFAZolin (ANCEF) IVPB 2g/100 mL  premix     2 g 200 mL/hr over 30 Minutes Intravenous On call to O.R. 08/31/18 [id number] 08/31/18 0913       Patient was given sequential compression devices, early ambulation, and chemoprophylaxis to prevent DVT.  Patient benefited maximally from hospital stay and there were no complications.    Recent vital signs:  Patient Vitals for the past 24 hrs:  BP Temp Temp src Pulse Resp SpO2  09/03/18 0548 131/72 98.5 F (36.9 C) Oral (!) 130 16 98 %  09/03/18 0547 131/72 98.5 F (36.9 C) Oral (!) 130 16 -  09/02/18 2154 137/78 98.5 F (36.9 C) Oral (!) 120 16 91 %  09/02/18 2154 137/78 98.5 F (36.9 C) Oral (!) 122 16 91 %  09/02/18 1333 (!) 101/46 97.9 F (36.6 C) Oral (!) 102 16 95 %     Recent laboratory studies:  Recent Labs    09/01/18 0354  WBC 7.7  HGB 11.2*  HCT 34.0*  PLT 188  NA 141  K 4.5  CL 106  CO2 26  BUN 10  CREATININE 0.66  GLUCOSE 139*  CALCIUM 9.1     Discharge Medications:   Allergies as of 09/03/2018      Reactions   Codeine Itching   Insomnia    Hydrocodone Itching, Other (See Comments)   INTOLERANCE >  Insomnia   Neosporin [neomycin-bacitracin Zn-polymyx] Rash      Medication List    STOP taking these medications   aspirin EC  81 MG tablet Replaced by:  aspirin 81 MG chewable tablet   naproxen sodium 220 MG tablet Commonly known as:  ALEVE     TAKE these medications   ALIGN 4 MG Caps Take 4 mg daily by mouth.   aspirin 81 MG chewable tablet Chew 1 tablet (81 mg total) by mouth 2 (two) times daily. Replaces:  aspirin EC 81 MG tablet   BIOFREEZE EX Apply 1 application topically daily as needed (leg French).   BION TEARS OP Place 1-2 drops into both eyes as needed (dry eyes).   cetirizine 10 MG tablet Commonly known as:  ZYRTEC Take 10 mg daily by mouth.   GLUCOSAMINE CHOND COMPLEX/MSM PO Take by mouth daily.   methocarbamol 500 MG tablet Commonly known as:  ROBAXIN Take 1 tablet (500 mg total) by mouth every 6 (six) hours as  needed for muscle spasms.   metoprolol tartrate 25 MG tablet Commonly known as:  LOPRESSOR Take 12.5 mg by mouth 2 (two) times daily.   naphazoline-pheniramine 0.025-0.3 % ophthalmic solution Commonly known as:  NAPHCON-A Place 1-2 drops into both eyes as needed for eye irritation or allergies.   nitroGLYCERIN 0.4 MG SL tablet Commonly known as:  NITROSTAT Place 1 tablet (0.4 mg total) under the tongue every 5 (five) minutes as needed for chest French.   oxyCODONE 5 MG immediate release tablet Commonly known as:  Oxy IR/ROXICODONE Take 1-2 tablets (5-10 mg total) by mouth every 4 (four) hours as needed for moderate French.   pravastatin 40 MG tablet Commonly known as:  PRAVACHOL Take 40 mg daily by mouth.   ranitidine 150 MG tablet Commonly known as:  ZANTAC Take 150 mg by mouth 2 (two) times daily.   ranitidine 75 MG tablet Commonly known as:  ZANTAC Take 1 tablet (75 mg total) by mouth 2 (two) times daily.   SUPER B COMPLEX & C Tabs Take 1 tablet by mouth daily.   UNISOM 25 MG tablet Generic drug:  doxylamine (Sleep) Take 25 mg at bedtime by mouth.   Vitamin D-3 1000 units Caps Take 1,000 Units daily by mouth.   zolpidem 5 MG tablet Commonly known as:  AMBIEN Take 5 mg at bedtime as needed by mouth (for sleep.).            Durable Medical Equipment  (From admission, onward)         Start     Ordered   08/31/18 1128  DME 3 n 1  Once     08/31/18 1128   08/31/18 1128  DME Walker rolling  Once    Question:  Patient needs a walker to treat with the following condition  Answer:  Status post total replacement of left hip   08/31/18 1128          Diagnostic Studies: Dg Pelvis Portable  Result Date: 08/31/2018 CLINICAL DATA:  Status post left total hip replacement. EXAM: PORTABLE PELVIS 1-2 VIEWS COMPARISON:  Radiographs of January 12, 2017. FINDINGS: Status post left total hip arthroplasty. The femoral and acetabular components appear to be well situated.  Expected postoperative changes are noted in the surrounding soft tissues. IMPRESSION: Status post left total hip arthroplasty. Electronically Signed   By: Marijo Conception, M.D.   On: 08/31/2018 10:50   Dg C-arm 1-60 Min-no Report  Result Date: 08/31/2018 Fluoroscopy was utilized by the requesting physician.  No radiographic interpretation.    Disposition: Discharge disposition: 03-Skilled [place]  Discharge Instructions    Discharge patient   Complete by:  As directed    Discharge disposition:  03-Skilled [place]   Discharge patient date:  09/03/2018       Contact information for follow-up providers    Mcarthur Rossetti, MD Follow up in 2 week(s).   Specialty:  Orthopedic Surgery Contact information: [place] [address] [phone]            Contact information for after-discharge care    Destination    [facility] .   Service:  Skilled Nursing Contact information: [address] [place] [phone]                   Signed: Mcarthur Rossetti 09/03/2018, 7:36 AM

## 2018-09-04 ENCOUNTER — Non-Acute Institutional Stay (SKILLED_NURSING_FACILITY): Payer: Medicare Other | Admitting: Internal Medicine

## 2018-09-04 ENCOUNTER — Encounter: Payer: Self-pay | Admitting: Internal Medicine

## 2018-09-04 ENCOUNTER — Other Ambulatory Visit: Payer: Self-pay | Admitting: Internal Medicine

## 2018-09-04 DIAGNOSIS — J90 Pleural effusion, not elsewhere classified: Secondary | ICD-10-CM | POA: Diagnosis not present

## 2018-09-04 DIAGNOSIS — C3432 Malignant neoplasm of lower lobe, left bronchus or lung: Secondary | ICD-10-CM | POA: Diagnosis not present

## 2018-09-04 DIAGNOSIS — Z7189 Other specified counseling: Secondary | ICD-10-CM

## 2018-09-04 DIAGNOSIS — M1612 Unilateral primary osteoarthritis, left hip: Secondary | ICD-10-CM

## 2018-09-04 DIAGNOSIS — M62552 Muscle wasting and atrophy, not elsewhere classified, left thigh: Secondary | ICD-10-CM | POA: Diagnosis not present

## 2018-09-04 DIAGNOSIS — G479 Sleep disorder, unspecified: Secondary | ICD-10-CM | POA: Diagnosis not present

## 2018-09-04 DIAGNOSIS — R278 Other lack of coordination: Secondary | ICD-10-CM | POA: Diagnosis not present

## 2018-09-04 DIAGNOSIS — Z96642 Presence of left artificial hip joint: Secondary | ICD-10-CM | POA: Diagnosis not present

## 2018-09-04 DIAGNOSIS — M25552 Pain in left hip: Secondary | ICD-10-CM | POA: Diagnosis not present

## 2018-09-04 DIAGNOSIS — M63852 Disorders of muscle in diseases classified elsewhere, left thigh: Secondary | ICD-10-CM | POA: Diagnosis not present

## 2018-09-04 DIAGNOSIS — Z471 Aftercare following joint replacement surgery: Secondary | ICD-10-CM | POA: Diagnosis not present

## 2018-09-04 DIAGNOSIS — Z853 Personal history of malignant neoplasm of breast: Secondary | ICD-10-CM

## 2018-09-04 DIAGNOSIS — M810 Age-related osteoporosis without current pathological fracture: Secondary | ICD-10-CM

## 2018-09-04 DIAGNOSIS — Z4732 Aftercare following explantation of hip joint prosthesis: Secondary | ICD-10-CM | POA: Diagnosis not present

## 2018-09-04 DIAGNOSIS — R2689 Other abnormalities of gait and mobility: Secondary | ICD-10-CM | POA: Diagnosis not present

## 2018-09-04 NOTE — Progress Notes (Signed)
Patient ID: [name] French, female   DOB: 1926/02/14, 84 y.o.   MRN: [id number]  Provider:  Rexene Edison. Mariea Clonts, D.O., C.M.D. Location:  [place] Room Number: [place] of Service:  SNF (31)  PCP: Tisovec, Fransico Him, MD Patient Care Team: Tisovec, Fransico Him, MD as PCP - General (Internal Medicine) Marcy Panning, MD as Consulting Physician (Oncology)  Extended Emergency Contact Information Primary Emergency Contact: Eloy [place] of [place] Phone: [phone] Relation: Daughter Secondary Emergency Contact: Miles Costain [place] of [place] Phone: [phone] Mobile Phone: [phone] Relation: Son  Code Status: DNR Goals of Care: Advanced Directive information Advanced Directives 09/04/2018  Does Patient Have a Medical Advance Directive? Yes  Type of Advance Directive [place]  Does patient want to make changes to medical advance directive? No - Patient declined  Copy of [place] in Chart? Yes   Chief Complaint  Patient presents with  . New Admit To SNF    Rehab admission    HPI: Patient is a 84 y.o. female seen today for admission to [facility] re[facility] s/p left total hip replacement on 08/31/18.  She has a h/o breast cancer s/p lumpectomy on the right, but most recently lung cancer with left pleural effusion s/p XRT.  She's also had osteopenia, osteoarthritis, insomnia, CKD3, colon polyps.  She reports being in good health prior to these recent events. She remains quite active.  She does use unisom scheduled each night and prn ambien, but did not take the [place] often.  She is on oxycodone 5-10 mg for moderate or severe pain every 4 hrs as needed postoperatively.  She reports her pain as very well controlled and she's not using much of this medication.  She also has robaxin from orthopedics.  She is WBAT and working with PT, OT, doing very well.  She's on asa 81 mg po  bid for 4 weeks then returning to daily.    She had no other concerns at her visit.  We discussed code status.  She has a HCPOA in place and on file, and thought that covered her, but we discussed that the DNR form is a physician's order.  She is clear that she would not want CPR or intubation and mechanical ventilation if she had a cardiopulmonary arrest.  Gold form was completed.  Copy made to be scanned and original placed in paper rehab chart.    Past Medical History:  Diagnosis Date  . Allergic rhinitis   . Anemia    during pregnancy  . Arthritis   . Breast cancer ([place]) 05/08/13   right upper inner, invasive mammary  . Breast cancer, right breast ([place]) 04/16/2013   Underwent lumpectomy on 11/06/13. Path showed G1 ILC, 2.7 cm, neg margins, receptor+, Her2neg   . Diverticulosis   . 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 Hemorrhoids   . Hx of adenomatous colonic polyps 05/2002  . Lung cancer ([place])   . Lung cancer ([place]) 10/2017  . Meningioma ([place])   . Osteoporosis   . Pneumonia    walking pneumonia  . Rosacea   . 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 Use of letrozole ([place])    neoadjuvant antiestrogen therapy with letrozole 2.5 mg daily x 7 monhts   Past Surgical History:  Procedure Laterality Date  . BREAST BIOPSY Left 1960   lt br bx/benign  . BREAST EXCISIONAL BIOPSY Left   . BREAST  LUMPECTOMY Right   . BREAST LUMPECTOMY WITH NEEDLE LOCALIZATION Bilateral 11/06/2013   Procedure: BREAST LUMPECTOMY WITH NEEDLE LOCALIZATION;  Surgeon: Haywood Lasso, MD;  Location: [facility];  Service: General;  Laterality: Bilateral;  . COLONOSCOPY    . EYE SURGERY     both cataracts  . MOHS SURGERY Right    nose basal/squamous  . TOTAL HIP ARTHROPLASTY Left 08/31/2018   Procedure: LEFT TOTAL HIP ARTHROPLASTY ANTERIOR APPROACH;  Surgeon: Mcarthur Rossetti, MD;  Location: [facility];  Service: Orthopedics;  Laterality: Left;  Marland Kitchen VIDEO BRONCHOSCOPY WITH ENDOBRONCHIAL  NAVIGATION N/A 11/01/2017   Procedure: VIDEO BRONCHOSCOPY WITH ENDOBRONCHIAL NAVIGATION;  Surgeon: [name], MD;  Location: [facility];  Service: Thoracic;  Laterality: N/A;  . WRIST SURGERY  1990   lt    reports that she quit smoking about 39 years ago. Her smoking use included cigarettes. She has a 12.50 pack-year smoking history. She has never used smokeless tobacco. She reports that she drinks about 4.0 standard drinks of alcohol per week. She reports that she does not use drugs. Social History   Socioeconomic History  . Marital status: Divorced    Spouse name: Not on file  . Number of children: 2  . Years of education: Not on file  . Highest education level: Not on file  Occupational History  . Occupation: Retired  Scientific laboratory technician  . Financial resource strain: Not on file  . Food insecurity:    Worry: Not on file    Inability: Not on file  . Transportation needs:    Medical: Not on file    Non-medical: Not on file  Tobacco Use  . Smoking status: Former Smoker    Packs/day: 0.50    Years: 25.00    Pack years: 12.50    Types: Cigarettes    Last attempt to quit: 12/19/1978    Years since quitting: 39.7  . Smokeless tobacco: Never Used  Substance and Sexual Activity  . Alcohol use: Yes    Alcohol/week: 4.0 standard drinks    Types: 4 Glasses of wine per week  . Drug use: No  . Sexual activity: Not Currently    Comment: menarche at age14, menopause in 77, HRT x 8, age at first live birth 89's, G74 P3  Lifestyle  . Physical activity:    Days per week: Not on file    Minutes per session: Not on file  . Stress: Not on file  Relationships  . Social connections:    Talks on phone: Not on file    Gets together: Not on file    Attends religious service: Not on file    Active member of club or organization: Not on file    Attends meetings of clubs or organizations: Not on file    Relationship status: Not on file  . Intimate partner violence:    Fear of current or ex  partner: Not on file    Emotionally abused: Not on file    Physically abused: Not on file    Forced sexual activity: Not on file  Other Topics Concern  . Not on file  Social History Narrative  . Not on file    Functional Status Survey:    Family History  Problem Relation Age of Onset  . Heart disease Mother   . Prostate cancer Father   . Lung cancer Sister     Health Maintenance  Topic Date Due  . Samul Dada  11/[birth year]/1947  .  DEXA SCAN  11/22/1993  . PNA vac Low Risk Adult (1 of 2 - PCV13) 11/22/1993  . COLONOSCOPY  12/07/2014  . INFLUENZA VACCINE  07/19/2018    Allergies  Allergen Reactions  . Codeine Itching    Insomnia   . Hydrocodone Itching and Other (See Comments)    INTOLERANCE >  Insomnia  . Neosporin [Neomycin-Bacitracin Zn-Polymyx] Rash    Outpatient Encounter Medications as of 09/04/2018  Medication Sig  . Artificial Tear Solution (BION TEARS OP) Place 1-2 drops into both eyes as needed (dry eyes).   Marland Kitchen aspirin 81 MG chewable tablet Chew 1 tablet (81 mg total) by mouth 2 (two) times daily.  . cetirizine (ZYRTEC) 10 MG tablet Take 10 mg daily by mouth.   . Cholecalciferol (VITAMIN D-3) 1000 UNITS CAPS Take 1,000 Units daily by mouth.   . doxylamine, Sleep, (UNISOM) 25 MG tablet Take 25 mg at bedtime by mouth.  Marland Kitchen Glucosamine-Chondroit-MSM-C-Mn CAPS Take 1 capsule by mouth daily.  . Menthol, Topical Analgesic, (BIOFREEZE EX) Apply 1 application topically daily as needed (leg pain).  . methocarbamol (ROBAXIN) 500 MG tablet Take 1 tablet (500 mg total) by mouth every 6 (six) hours as needed for muscle spasms.  . metoprolol tartrate (LOPRESSOR) 25 MG tablet Take 12.5 mg by mouth 2 (two) times daily.  . naphazoline-pheniramine (NAPHCON-A) 0.025-0.3 % ophthalmic solution Place 1-2 drops into both eyes as needed for eye irritation or allergies.   . nitroGLYCERIN (NITROSTAT) 0.4 MG SL tablet Place 1 tablet (0.4 mg total) under the tongue every 5 (five) minutes as  needed for chest pain.  Marland Kitchen oxyCODONE (OXY IR/ROXICODONE) 5 MG immediate release tablet Take 1-2 tablets (5-10 mg total) by mouth every 4 (four) hours as needed for moderate pain.  . pravastatin (PRAVACHOL) 40 MG tablet Take 40 mg daily by mouth.   . Probiotic Product (ALIGN) 4 MG CAPS Take 4 mg daily by mouth.  . ranitidine (ZANTAC 75) 75 MG tablet Take 1 tablet (75 mg total) by mouth 2 (two) times daily.  . SUPER B COMPLEX & C TABS Take 1 tablet by mouth daily.  Marland Kitchen zolpidem (AMBIEN) 5 MG tablet Take 5 mg at bedtime as needed by mouth (for sleep.).   Marland Kitchen Misc Natural Products (GLUCOSAMINE CHOND COMPLEX/MSM PO) Take by mouth daily.   Facility-Administered Encounter Medications as of 09/04/2018  Medication  . 0.9 %  sodium chloride infusion    Review of Systems  Constitutional: Negative for activity change, appetite change, chills and fever.  HENT: Negative for congestion and trouble swallowing.   Eyes: Negative for visual disturbance.  Respiratory: Negative for chest tightness and shortness of breath.   Cardiovascular: Negative for chest pain, palpitations and leg swelling.  Gastrointestinal: Negative for abdominal pain, constipation, diarrhea, nausea and vomiting.  Genitourinary: Negative for dysuria.  Musculoskeletal: Positive for arthralgias. Negative for gait problem and joint swelling.  Neurological: Negative for dizziness and weakness.  Hematological: Does not bruise/bleed easily.  Psychiatric/Behavioral: Positive for sleep disturbance. Negative for confusion. The patient is not nervous/anxious.     Vitals:   09/04/18 1057  BP: 94/60  Pulse: (!) 126  Resp: 20  Temp: 97.7 F (36.5 C)  TempSrc: Oral  SpO2: 96%  Weight: 133 lb (60.3 kg)  Height: _0  (1.626 m)   Body mass index is 22.83 kg/m. Physical Exam  Constitutional: She is oriented to person, place, and time. She appears well-developed and well-nourished. No distress.  HENT:  Head: Normocephalic and atraumatic.  Right Ear: External ear normal.  Left Ear: External ear normal.  Nose: Nose normal.  Mouth/Throat: Oropharynx is clear and moist.  Eyes: Pupils are equal, round, and reactive to light. EOM are normal.  Neck: Normal range of motion. Neck supple. No JVD present.  Cardiovascular: Normal rate, regular rhythm, normal heart sounds and intact distal pulses.  Pulmonary/Chest: Effort normal and breath sounds normal. No respiratory distress. She has no wheezes. She has no rales.  Abdominal: Soft. Bowel sounds are normal. She exhibits no distension. There is no tenderness.  Musculoskeletal: Normal range of motion. She exhibits no tenderness.  Neurological: She is alert and oriented to person, place, and time. No cranial nerve deficit.  Skin: Capillary refill takes less than 2 seconds.  No significant erythema, warmth, swelling, or visible drainage outside of the dressings that are to be left intact per ortho  Psychiatric: She has a normal mood and affect.    Labs reviewed: Basic Metabolic Panel: Recent Labs    02/06/18 1610 07/09/18 1128 08/24/18 1429 09/01/18 0354  NA 131*  --  141 141  K 4.0  --  4.6 4.5  CL 94*  --  104 106  CO2 25  --  29 26  GLUCOSE 105*  --  91 139*  BUN 10  --  14 10  CREATININE 0.66 0.70 0.76 0.66  CALCIUM 9.6  --  9.6 9.1   Liver Function Tests: Recent Labs    11/01/17 1200  AST 19  ALT 12*  ALKPHOS 63  BILITOT 0.8  PROT 7.0  ALBUMIN 4.0   No results for input(s): LIPASE, AMYLASE in the last 8760 hours. No results for input(s): AMMONIA in the last 8760 hours. CBC: Recent Labs    02/06/18 1610 08/24/18 1429 09/01/18 0354  WBC 6.4 5.9 7.7  NEUTROABS 5.3  --   --   HGB 12.5 13.8 11.2*  HCT 37.4 42.2 34.0*  MCV 81.5 88.3 88.3  PLT 215 222 188   Cardiac Enzymes: Recent Labs    12/25/17 2115 12/26/17 0025 02/06/18 1610  TROPONINI <0.03 <0.03 <0.03   BNP: Invalid input(s): POCBNP Lab Results  Component Value Date   HGBA1C 5.6 12/25/2017    Lab Results  Component Value Date   TSH 2.780 03/12/2018   Assessment/Plan 1. Unilateral primary osteoarthritis, left hip -s/p total hip replacement on 9/13 -doing very well WBAT with PT, OT -is gradually requiring less oxycodone and robaxin -cont asa dvt prophylaxis bid for 4 wks, then back to 74m daily--admission note from the hospital does not include medications  2. Status post total replacement of left hip -by Dr. BNinfa Lindenon 08/31/18, cont PT, OT as above  3. History of breast cancer -2014--right grade 1-2 invasive mammary ca ER+PR+, HER2-neu neg treated with lumpectomies and letrozole which she stopped due to arthralgias, myalgias, fatigue and hair loss (see full details in note Dr. GLindi Adie3/5/19), radiation was declined  4. Primary cancer of left lower lobe of lung ([place] -NSCLC right lung 10/20/17 before treated with radiation -then had radiation for LLL lesion 12/18-1/19, then for serial CTs every 2-3 mos  5. Pleural effusion on left -had been noted--moderate left pleural effusion in July of '19  6. Senile osteoporosis -continues on vitamin D, does not appear to be on other txs at this time, high fx risk after letrozole use  7. Sleep disturbances -continues on regular unisom and prn ambien, risks of sleeping pills with age discussed  843  ACP:  Last ACP Note 06/16/2018 to 09/14/2018       ACP (Advance Care Planning) by Gayland Curry, DO at 09/04/2018 10:56 AM    Date of Service   Author Author Type Status Note Type File Time  09/04/2018 Addend Gayland Curry, DO Physician Signed ACP (Advance Care Planning) 09/07/2018              We discussed code status.  She has a HCPOA in place and on file, and thought that covered her, but we discussed that the DNR form is a physician's order.  She is clear that she would not want CPR or intubation and mechanical ventilation if she had a cardiopulmonary arrest.  Gold form was completed.  Copy made to be scanned and original  placed in paper rehab chart.                  Family/ staff Communication:  Discussed with rehab nurse and nurse manager  Labs/tests ordered:  No new   Angelissa Supan L. Eufelia Veno, D.O. [facility] [address], [place] [address] Cell Phone (Mon-Fri 8am-5pm):  [phone] On Call:  [phone] & follow prompts after 5pm & weekends Office Phone:  [phone] Office Fax:  [phone]

## 2018-09-05 DIAGNOSIS — M25552 Pain in left hip: Secondary | ICD-10-CM | POA: Diagnosis not present

## 2018-09-05 DIAGNOSIS — M63852 Disorders of muscle in diseases classified elsewhere, left thigh: Secondary | ICD-10-CM | POA: Diagnosis not present

## 2018-09-05 DIAGNOSIS — M62552 Muscle wasting and atrophy, not elsewhere classified, left thigh: Secondary | ICD-10-CM | POA: Diagnosis not present

## 2018-09-05 DIAGNOSIS — R2689 Other abnormalities of gait and mobility: Secondary | ICD-10-CM | POA: Diagnosis not present

## 2018-09-05 DIAGNOSIS — M1612 Unilateral primary osteoarthritis, left hip: Secondary | ICD-10-CM | POA: Diagnosis not present

## 2018-09-05 DIAGNOSIS — R278 Other lack of coordination: Secondary | ICD-10-CM | POA: Diagnosis not present

## 2018-09-06 DIAGNOSIS — M63852 Disorders of muscle in diseases classified elsewhere, left thigh: Secondary | ICD-10-CM | POA: Diagnosis not present

## 2018-09-06 DIAGNOSIS — R278 Other lack of coordination: Secondary | ICD-10-CM | POA: Diagnosis not present

## 2018-09-06 DIAGNOSIS — R2689 Other abnormalities of gait and mobility: Secondary | ICD-10-CM | POA: Diagnosis not present

## 2018-09-06 DIAGNOSIS — M1612 Unilateral primary osteoarthritis, left hip: Secondary | ICD-10-CM | POA: Diagnosis not present

## 2018-09-06 DIAGNOSIS — M62552 Muscle wasting and atrophy, not elsewhere classified, left thigh: Secondary | ICD-10-CM | POA: Diagnosis not present

## 2018-09-06 DIAGNOSIS — M25552 Pain in left hip: Secondary | ICD-10-CM | POA: Diagnosis not present

## 2018-09-07 DIAGNOSIS — M1612 Unilateral primary osteoarthritis, left hip: Secondary | ICD-10-CM | POA: Diagnosis not present

## 2018-09-07 DIAGNOSIS — R278 Other lack of coordination: Secondary | ICD-10-CM | POA: Diagnosis not present

## 2018-09-07 DIAGNOSIS — M25552 Pain in left hip: Secondary | ICD-10-CM | POA: Diagnosis not present

## 2018-09-07 DIAGNOSIS — M62552 Muscle wasting and atrophy, not elsewhere classified, left thigh: Secondary | ICD-10-CM | POA: Diagnosis not present

## 2018-09-07 DIAGNOSIS — M63852 Disorders of muscle in diseases classified elsewhere, left thigh: Secondary | ICD-10-CM | POA: Diagnosis not present

## 2018-09-07 DIAGNOSIS — R2689 Other abnormalities of gait and mobility: Secondary | ICD-10-CM | POA: Diagnosis not present

## 2018-09-07 NOTE — ACP (Advance Care Planning) (Signed)
  We discussed code status.  She has a HCPOA in place and on file, and thought that covered her, but we discussed that the DNR form is a physician's order.  She is clear that she would not want CPR or intubation and mechanical ventilation if she had a cardiopulmonary arrest.  Gold form was completed.  Copy made to be scanned and original placed in paper rehab chart.

## 2018-09-10 DIAGNOSIS — M25552 Pain in left hip: Secondary | ICD-10-CM | POA: Diagnosis not present

## 2018-09-10 DIAGNOSIS — M63852 Disorders of muscle in diseases classified elsewhere, left thigh: Secondary | ICD-10-CM | POA: Diagnosis not present

## 2018-09-10 DIAGNOSIS — M62552 Muscle wasting and atrophy, not elsewhere classified, left thigh: Secondary | ICD-10-CM | POA: Diagnosis not present

## 2018-09-10 DIAGNOSIS — M1612 Unilateral primary osteoarthritis, left hip: Secondary | ICD-10-CM | POA: Diagnosis not present

## 2018-09-10 DIAGNOSIS — R278 Other lack of coordination: Secondary | ICD-10-CM | POA: Diagnosis not present

## 2018-09-10 DIAGNOSIS — R2689 Other abnormalities of gait and mobility: Secondary | ICD-10-CM | POA: Diagnosis not present

## 2018-09-11 DIAGNOSIS — R2689 Other abnormalities of gait and mobility: Secondary | ICD-10-CM | POA: Diagnosis not present

## 2018-09-11 DIAGNOSIS — M25552 Pain in left hip: Secondary | ICD-10-CM | POA: Diagnosis not present

## 2018-09-11 DIAGNOSIS — M62552 Muscle wasting and atrophy, not elsewhere classified, left thigh: Secondary | ICD-10-CM | POA: Diagnosis not present

## 2018-09-11 DIAGNOSIS — M1612 Unilateral primary osteoarthritis, left hip: Secondary | ICD-10-CM | POA: Diagnosis not present

## 2018-09-11 DIAGNOSIS — R278 Other lack of coordination: Secondary | ICD-10-CM | POA: Diagnosis not present

## 2018-09-11 DIAGNOSIS — M63852 Disorders of muscle in diseases classified elsewhere, left thigh: Secondary | ICD-10-CM | POA: Diagnosis not present

## 2018-09-12 ENCOUNTER — Ambulatory Visit
Admission: RE | Admit: 2018-09-12 | Discharge: 2018-09-12 | Disposition: A | Discharge status: Home / Self Care | Payer: Medicare Other | Source: Ambulatory Visit | Attending: Urology | Admitting: Urology

## 2018-09-12 DIAGNOSIS — M63852 Disorders of muscle in diseases classified elsewhere, left thigh: Secondary | ICD-10-CM | POA: Diagnosis not present

## 2018-09-12 DIAGNOSIS — R2689 Other abnormalities of gait and mobility: Secondary | ICD-10-CM | POA: Diagnosis not present

## 2018-09-12 DIAGNOSIS — M1612 Unilateral primary osteoarthritis, left hip: Secondary | ICD-10-CM | POA: Diagnosis not present

## 2018-09-12 DIAGNOSIS — R278 Other lack of coordination: Secondary | ICD-10-CM | POA: Diagnosis not present

## 2018-09-12 DIAGNOSIS — M25552 Pain in left hip: Secondary | ICD-10-CM | POA: Diagnosis not present

## 2018-09-12 DIAGNOSIS — M62552 Muscle wasting and atrophy, not elsewhere classified, left thigh: Secondary | ICD-10-CM | POA: Diagnosis not present

## 2018-09-13 ENCOUNTER — Encounter (INDEPENDENT_AMBULATORY_CARE_PROVIDER_SITE_OTHER): Payer: Self-pay | Admitting: Orthopaedic Surgery

## 2018-09-13 ENCOUNTER — Ambulatory Visit (INDEPENDENT_AMBULATORY_CARE_PROVIDER_SITE_OTHER): Payer: Medicare Other | Admitting: Orthopaedic Surgery

## 2018-09-13 DIAGNOSIS — M25552 Pain in left hip: Secondary | ICD-10-CM | POA: Diagnosis not present

## 2018-09-13 DIAGNOSIS — R278 Other lack of coordination: Secondary | ICD-10-CM | POA: Diagnosis not present

## 2018-09-13 DIAGNOSIS — M62552 Muscle wasting and atrophy, not elsewhere classified, left thigh: Secondary | ICD-10-CM | POA: Diagnosis not present

## 2018-09-13 DIAGNOSIS — R2689 Other abnormalities of gait and mobility: Secondary | ICD-10-CM | POA: Diagnosis not present

## 2018-09-13 DIAGNOSIS — Z96642 Presence of left artificial hip joint: Secondary | ICD-10-CM

## 2018-09-13 DIAGNOSIS — M63852 Disorders of muscle in diseases classified elsewhere, left thigh: Secondary | ICD-10-CM | POA: Diagnosis not present

## 2018-09-13 DIAGNOSIS — M1612 Unilateral primary osteoarthritis, left hip: Secondary | ICD-10-CM | POA: Diagnosis not present

## 2018-09-13 NOTE — Progress Notes (Signed)
Patient is a very pleasant 84 year old female who is 2 weeks tomorrow status post a left total hip arthroplasty.  She is staying at [facility] in the skilled nursing section she is about ready to transition back to her independent living.  She is doing well overall.  On exam her incision looks good on the left side.  I removed staples and placed Steri-Strips.  Her leg lengths are equal.  She tolerates me putting her hip the range of motion.  This point she will increase her activities as comfort allows.  I have given instructions to the skilled nursing facility to have her go back to her once daily aspirin that she was on before this.  She can stop wearing her TED hose since she is having no swelling.  She can even take Advil for pain if needed.  All question concerns were answered and addressed.  I would like to see her back in 4 weeks but no x-rays are needed.

## 2018-09-14 ENCOUNTER — Other Ambulatory Visit: Payer: Self-pay | Admitting: Orthopaedic Surgery

## 2018-09-14 DIAGNOSIS — M25552 Pain in left hip: Secondary | ICD-10-CM | POA: Diagnosis not present

## 2018-09-14 DIAGNOSIS — M810 Age-related osteoporosis without current pathological fracture: Secondary | ICD-10-CM | POA: Insufficient documentation

## 2018-09-14 DIAGNOSIS — R2689 Other abnormalities of gait and mobility: Secondary | ICD-10-CM | POA: Diagnosis not present

## 2018-09-14 DIAGNOSIS — M62552 Muscle wasting and atrophy, not elsewhere classified, left thigh: Secondary | ICD-10-CM | POA: Diagnosis not present

## 2018-09-14 DIAGNOSIS — Z853 Personal history of malignant neoplasm of breast: Secondary | ICD-10-CM | POA: Insufficient documentation

## 2018-09-14 DIAGNOSIS — R278 Other lack of coordination: Secondary | ICD-10-CM | POA: Diagnosis not present

## 2018-09-14 DIAGNOSIS — M1612 Unilateral primary osteoarthritis, left hip: Secondary | ICD-10-CM | POA: Diagnosis not present

## 2018-09-14 DIAGNOSIS — G479 Sleep disorder, unspecified: Secondary | ICD-10-CM | POA: Insufficient documentation

## 2018-09-14 DIAGNOSIS — Z7189 Other specified counseling: Secondary | ICD-10-CM | POA: Insufficient documentation

## 2018-09-14 DIAGNOSIS — M63852 Disorders of muscle in diseases classified elsewhere, left thigh: Secondary | ICD-10-CM | POA: Diagnosis not present

## 2018-09-17 DIAGNOSIS — R278 Other lack of coordination: Secondary | ICD-10-CM | POA: Diagnosis not present

## 2018-09-17 DIAGNOSIS — R2689 Other abnormalities of gait and mobility: Secondary | ICD-10-CM | POA: Diagnosis not present

## 2018-09-17 DIAGNOSIS — M1612 Unilateral primary osteoarthritis, left hip: Secondary | ICD-10-CM | POA: Diagnosis not present

## 2018-09-17 DIAGNOSIS — M63852 Disorders of muscle in diseases classified elsewhere, left thigh: Secondary | ICD-10-CM | POA: Diagnosis not present

## 2018-09-17 DIAGNOSIS — M62552 Muscle wasting and atrophy, not elsewhere classified, left thigh: Secondary | ICD-10-CM | POA: Diagnosis not present

## 2018-09-17 DIAGNOSIS — M25552 Pain in left hip: Secondary | ICD-10-CM | POA: Diagnosis not present

## 2018-09-18 DIAGNOSIS — M62552 Muscle wasting and atrophy, not elsewhere classified, left thigh: Secondary | ICD-10-CM | POA: Diagnosis not present

## 2018-09-18 DIAGNOSIS — M25552 Pain in left hip: Secondary | ICD-10-CM | POA: Diagnosis not present

## 2018-09-18 DIAGNOSIS — M63852 Disorders of muscle in diseases classified elsewhere, left thigh: Secondary | ICD-10-CM | POA: Diagnosis not present

## 2018-09-18 DIAGNOSIS — R278 Other lack of coordination: Secondary | ICD-10-CM | POA: Diagnosis not present

## 2018-09-18 DIAGNOSIS — R2689 Other abnormalities of gait and mobility: Secondary | ICD-10-CM | POA: Diagnosis not present

## 2018-09-18 DIAGNOSIS — M1612 Unilateral primary osteoarthritis, left hip: Secondary | ICD-10-CM | POA: Diagnosis not present

## 2018-09-18 DIAGNOSIS — Z4732 Aftercare following explantation of hip joint prosthesis: Secondary | ICD-10-CM | POA: Diagnosis not present

## 2018-09-18 DIAGNOSIS — Z471 Aftercare following joint replacement surgery: Secondary | ICD-10-CM | POA: Diagnosis not present

## 2018-09-19 DIAGNOSIS — M25552 Pain in left hip: Secondary | ICD-10-CM | POA: Diagnosis not present

## 2018-09-19 DIAGNOSIS — M63852 Disorders of muscle in diseases classified elsewhere, left thigh: Secondary | ICD-10-CM | POA: Diagnosis not present

## 2018-09-19 DIAGNOSIS — R2689 Other abnormalities of gait and mobility: Secondary | ICD-10-CM | POA: Diagnosis not present

## 2018-09-19 DIAGNOSIS — R278 Other lack of coordination: Secondary | ICD-10-CM | POA: Diagnosis not present

## 2018-09-19 DIAGNOSIS — M62552 Muscle wasting and atrophy, not elsewhere classified, left thigh: Secondary | ICD-10-CM | POA: Diagnosis not present

## 2018-09-19 DIAGNOSIS — M1612 Unilateral primary osteoarthritis, left hip: Secondary | ICD-10-CM | POA: Diagnosis not present

## 2018-09-20 ENCOUNTER — Other Ambulatory Visit: Payer: Self-pay | Admitting: Orthopaedic Surgery

## 2018-09-20 DIAGNOSIS — R278 Other lack of coordination: Secondary | ICD-10-CM | POA: Diagnosis not present

## 2018-09-20 DIAGNOSIS — M25552 Pain in left hip: Secondary | ICD-10-CM | POA: Diagnosis not present

## 2018-09-20 DIAGNOSIS — M1612 Unilateral primary osteoarthritis, left hip: Secondary | ICD-10-CM | POA: Diagnosis not present

## 2018-09-20 DIAGNOSIS — R2689 Other abnormalities of gait and mobility: Secondary | ICD-10-CM | POA: Diagnosis not present

## 2018-09-20 DIAGNOSIS — M62552 Muscle wasting and atrophy, not elsewhere classified, left thigh: Secondary | ICD-10-CM | POA: Diagnosis not present

## 2018-09-20 DIAGNOSIS — M63852 Disorders of muscle in diseases classified elsewhere, left thigh: Secondary | ICD-10-CM | POA: Diagnosis not present

## 2018-09-20 NOTE — Care Plan (Signed)
Spoke with patient. She has moved back to her apartment and is doing well. She is continuing with therapy. She is still using her walker and her goal is to be independent again as soon as she is strong enough. She is eating dinner in the dining room. She states no needs at this time

## 2018-09-21 DIAGNOSIS — M25552 Pain in left hip: Secondary | ICD-10-CM | POA: Diagnosis not present

## 2018-09-21 DIAGNOSIS — M63852 Disorders of muscle in diseases classified elsewhere, left thigh: Secondary | ICD-10-CM | POA: Diagnosis not present

## 2018-09-21 DIAGNOSIS — R278 Other lack of coordination: Secondary | ICD-10-CM | POA: Diagnosis not present

## 2018-09-21 DIAGNOSIS — R2689 Other abnormalities of gait and mobility: Secondary | ICD-10-CM | POA: Diagnosis not present

## 2018-09-21 DIAGNOSIS — M62552 Muscle wasting and atrophy, not elsewhere classified, left thigh: Secondary | ICD-10-CM | POA: Diagnosis not present

## 2018-09-21 DIAGNOSIS — M1612 Unilateral primary osteoarthritis, left hip: Secondary | ICD-10-CM | POA: Diagnosis not present

## 2018-09-25 DIAGNOSIS — M1612 Unilateral primary osteoarthritis, left hip: Secondary | ICD-10-CM | POA: Diagnosis not present

## 2018-09-25 DIAGNOSIS — M62552 Muscle wasting and atrophy, not elsewhere classified, left thigh: Secondary | ICD-10-CM | POA: Diagnosis not present

## 2018-09-25 DIAGNOSIS — R278 Other lack of coordination: Secondary | ICD-10-CM | POA: Diagnosis not present

## 2018-09-25 DIAGNOSIS — R2689 Other abnormalities of gait and mobility: Secondary | ICD-10-CM | POA: Diagnosis not present

## 2018-09-25 DIAGNOSIS — M63852 Disorders of muscle in diseases classified elsewhere, left thigh: Secondary | ICD-10-CM | POA: Diagnosis not present

## 2018-09-25 DIAGNOSIS — M25552 Pain in left hip: Secondary | ICD-10-CM | POA: Diagnosis not present

## 2018-09-26 ENCOUNTER — Telehealth: Payer: Self-pay | Admitting: *Deleted

## 2018-09-26 NOTE — Telephone Encounter (Signed)
CALLED PATIENT TO INFORM OF STAT LABS ON 10-03-18 @ 11:30 AM @ [facility] AND HER CT ON 10-03-18 - ARRIVAL TIME - 12:15 PM @ [facility], PT. TO HAVE WATER ONLY - 4 HRS. PRIOR TO TEST, PT. TO GET RESULTS FROM [facility]  [facility] ON 10-04-18 @ 9:30 AM FOR RESULTS, SPOKE WITH PATIENT AND SHE IS AWARE OF THESE APPTS.

## 2018-09-27 ENCOUNTER — Encounter (INDEPENDENT_AMBULATORY_CARE_PROVIDER_SITE_OTHER): Payer: Self-pay

## 2018-09-27 NOTE — Progress Notes (Signed)
[name] French 84 y.o. man withnewly diagnosed synchronous NSCLC, adenocarcinoma with a 6 cm LLL mass extending into the left hilum (stage IIB, T3N0M0) and a 2.4 cm RML mass (stage IB T2N0)completed radiation 01-18-18 review 10-03-18 CT chest with contrast FU.   Weight changes, if any: Wt Readings from Last 3 Encounters:  10/04/18 127 lb 9.6 oz (57.9 kg)  09/04/18 133 lb (60.3 kg)  08/31/18 130 lb 6 oz (59.1 kg)   Respiratory complaints, if any:SOB when up and moving around, dry cough nostril at times, denies wheezing. Swallowing Problems/Pain/Difficulty swallowing:No problems just can't eat too fast. Appetite :Fair Pain:No  Wt Readings from Last 3 Encounters:  10/04/18 127 lb 9.6 oz (57.9 kg)  09/04/18 133 lb (60.3 kg)  08/31/18 130 lb 6 oz (59.1 kg)  BP 100/86 (BP Location: Left Arm, Patient Position: Sitting)   Pulse 92   Temp 97.8 F (36.6 C) (Oral)   Resp 18   Ht 5\' 4"  (1.626 m)   Wt 127 lb 9.6 oz (57.9 kg)   SpO2 95%   BMI 21.90 kg/m

## 2018-09-28 DIAGNOSIS — R2689 Other abnormalities of gait and mobility: Secondary | ICD-10-CM | POA: Diagnosis not present

## 2018-09-28 DIAGNOSIS — M62552 Muscle wasting and atrophy, not elsewhere classified, left thigh: Secondary | ICD-10-CM | POA: Diagnosis not present

## 2018-09-28 DIAGNOSIS — M1612 Unilateral primary osteoarthritis, left hip: Secondary | ICD-10-CM | POA: Diagnosis not present

## 2018-09-28 DIAGNOSIS — M25552 Pain in left hip: Secondary | ICD-10-CM | POA: Diagnosis not present

## 2018-09-28 DIAGNOSIS — R278 Other lack of coordination: Secondary | ICD-10-CM | POA: Diagnosis not present

## 2018-09-28 DIAGNOSIS — M63852 Disorders of muscle in diseases classified elsewhere, left thigh: Secondary | ICD-10-CM | POA: Diagnosis not present

## 2018-10-03 ENCOUNTER — Ambulatory Visit (HOSPITAL_COMMUNITY)
Admission: RE | Admit: 2018-10-03 | Discharge: 2018-10-03 | Disposition: A | Discharge status: Home / Self Care | Payer: Medicare Other | Source: Ambulatory Visit | Attending: Urology | Admitting: Urology

## 2018-10-03 ENCOUNTER — Ambulatory Visit
Admission: RE | Admit: 2018-10-03 | Discharge: 2018-10-03 | Disposition: A | Discharge status: Home / Self Care | Payer: Medicare Other | Source: Ambulatory Visit | Attending: Urology | Admitting: Urology

## 2018-10-03 DIAGNOSIS — C3491 Malignant neoplasm of unspecified part of right bronchus or lung: Secondary | ICD-10-CM | POA: Insufficient documentation

## 2018-10-03 DIAGNOSIS — C3432 Malignant neoplasm of lower lobe, left bronchus or lung: Secondary | ICD-10-CM | POA: Insufficient documentation

## 2018-10-03 DIAGNOSIS — I700 Atherosclerosis of aorta: Secondary | ICD-10-CM | POA: Insufficient documentation

## 2018-10-03 DIAGNOSIS — I2510 Atherosclerotic heart disease of native coronary artery without angina pectoris: Secondary | ICD-10-CM | POA: Insufficient documentation

## 2018-10-03 DIAGNOSIS — C3490 Malignant neoplasm of unspecified part of unspecified bronchus or lung: Secondary | ICD-10-CM | POA: Diagnosis not present

## 2018-10-03 LAB — BASIC METABOLIC PANEL - CANCER CENTER ONLY
Anion gap: 11 (ref 5–15)
BUN: 9 mg/dL (ref 8–23)
CALCIUM: 10.2 mg/dL (ref 8.9–10.3)
CO2: 28 mmol/L (ref 22–32)
CREATININE: 0.75 mg/dL (ref 0.44–1.00)
Chloride: 99 mmol/L (ref 98–111)
GFR, Est AFR Am: >60 mL/min (ref >60)
GLUCOSE: 92 mg/dL (ref 70–99)
POTASSIUM: 4.3 mmol/L (ref 3.5–5.1)
SODIUM: 138 mmol/L (ref 135–145)

## 2018-10-03 MED ORDER — IOHEXOL 300 MG/ML  SOLN
75.0000 mL | Freq: Once | INTRAMUSCULAR | Status: AC | PRN
  Administered 2018-10-03: 75 mL via INTRAVENOUS

## 2018-10-03 MED ORDER — SODIUM CHLORIDE 0.9 % IJ SOLN
INTRAMUSCULAR | Status: AC
  Filled 2018-10-03: qty 50

## 2018-10-04 ENCOUNTER — Encounter: Payer: Self-pay | Admitting: Urology

## 2018-10-04 ENCOUNTER — Ambulatory Visit
Admission: RE | Admit: 2018-10-04 | Discharge: 2018-10-04 | Disposition: A | Discharge status: Home / Self Care | Payer: Medicare Other | Source: Ambulatory Visit | Attending: Urology | Admitting: Urology

## 2018-10-04 ENCOUNTER — Other Ambulatory Visit: Payer: Self-pay

## 2018-10-04 VITALS — BP 100/86 | HR 92 | Temp 97.8°F | Resp 18 | Ht 64.0 in | Wt 127.6 lb

## 2018-10-04 DIAGNOSIS — Z08 Encounter for follow-up examination after completed treatment for malignant neoplasm: Secondary | ICD-10-CM | POA: Diagnosis not present

## 2018-10-04 DIAGNOSIS — C3432 Malignant neoplasm of lower lobe, left bronchus or lung: Secondary | ICD-10-CM

## 2018-10-04 DIAGNOSIS — C3491 Malignant neoplasm of unspecified part of right bronchus or lung: Secondary | ICD-10-CM

## 2018-10-04 DIAGNOSIS — D330 Benign neoplasm of brain, supratentorial: Secondary | ICD-10-CM | POA: Diagnosis not present

## 2018-10-04 DIAGNOSIS — C342 Malignant neoplasm of middle lobe, bronchus or lung: Secondary | ICD-10-CM | POA: Diagnosis not present

## 2018-10-04 DIAGNOSIS — J90 Pleural effusion, not elsewhere classified: Secondary | ICD-10-CM

## 2018-10-04 DIAGNOSIS — R000 Tachycardia, unspecified: Secondary | ICD-10-CM | POA: Diagnosis not present

## 2018-10-04 NOTE — Progress Notes (Signed)
[name]  Radiation Oncology         [phone]) [phone] ________________________________  Name: [name] French MRN: [id number]  Date: 10/04/2018  DOB: 05/15/1926  Post Treatment Note  CC: Tisovec, Fransico Him, MD  Marcy Panning, MD  Diagnosis:   84 y.o. woman with newly diagnosed synchronous NSCLC, adenocarcinoma with a 6 cm LLL mass extending into the left hilum (stage IIB, T3N0M0) and a 2.4 cm RML mass (stage IB T2N0).     Interval Since Last Radiation:  9 months  11/28/17 - 01/18/18:  66 Gy directed to the LLL and RML lung lesions in 33 fractions of 2 Gy each.  Narrative:  In summary, She has a remote history of breast cancer diagnosed and treated in 2014, an was initially seen in consult on 11/15/18 at the request of Dr. Lindi Adie for newly diagnosed non-small cell lung cancer and incidental finding of meningioma. She presented to her PCP in October 2018 with persistent dry cough that led to an isolated episode of hemoptysis. She denied further episodes of hemoptysis since that time. She presented to her PCP for evaluation and was being worked up for possible pneumonia with a chest x-ray and a CT scan of the chest. CT done on 10/11/2017 showed a 6.0 cm LLL lung mass with additional lung nodules, including a 2.4 cm pulmonary nodule in the RML as well as 0.3 cm satellite nodules. There was also a hypodense 0.4 cm RLL lesion. A PET CT scan on 112/18 demonstrated a hypermetabolic LLL mass extending into the left hilum with SUV of 26.8 (stage IIb, T3N0M0) and a mildly hypermetabolic RML mass with SUV max of 1.9 (stage IB T2N0).  There were no hypermetabolic hilar or mediastinal nodes.  She was evaluated with Dr. Roxan Hockey and underwent bilateral navigational bronchoscopy with biopsies of the lung masses on 11/01/17 which revealed bilateral non-small cell lung cancer, poorly differentiated adenocarcinoma.   An MRI brain was performed on 10/20/2017 for disease staging and incidentally showed a right inferior  parietal convexity meningioma measuring 2.9 x 2.7 x 1.4 cm. There was no evidence of metastatic disease. Her imaging was reviewed at the multidisciplinary brain conference and consensus recommendation was to repeat a scan in 3 months to assess for progression/enlargement. Repeat brain MRI was performed on 01/24/2018 demonstrating a persistent right temporoparietal meningioma unchanged in size with only mild vasogenic edema which appears improved from her previous study.  There was no evidence of metastatic disease to the brain.  This study was reviewed at the multidisciplinary brain conference on 01/29/2018 and consensus recommendation was to only repeat brain imaging if the patient develops symptoms.  The patient returns today for routine follow-up and review of recent follow up CT Chest.  She has not received any systemic treatment thus far, as the feeling was that concurrent chemoradiation would be too toxic for the patient due to her age.  However, molecular studies were ordered to assess for a potential role of immunotherapy if she demonstrated PD1 expression greater than 50%.  Unfortunately, her molecular studies returned showing only a very low expression of PD 1 at 5%.  There were no other identifiable mutations noted.  She tolerated radiation treatment relatively well. She denied having any pain but did report mild dysphagia, a dry cough and sore throat, but no hoarseness noted. She reported having SOB with great exertion only and was hospitalized on 12/25/17 under observation for tachycardia, chest pain and increased SOB and discharged home on Metoprolol which she has continued  under the direction of her cardiologist, Dr. Peter Martinique. She recently underwent Left THA with Dr. Ninfa Linden and is recovering very well, making great progress with PT.                            On review of systems, the patient states that she is doing well overall. Currently, she denies increased shortness of breath, orthopnea,  chest pain, dysphagia, fever, chills, cough or hemoptysis.  She has baseline shortness of breath with exertion and fatigue which has remained unchanged since 10/2017.  She has continued to tolerate the dose adjusted metoprolol for tachycardia. She denies headaches, changes in visual or auditory acuity, difficulty with speech or word finding, imbalance, tinnitus, tremor or seizure activity.    ALLERGIES:  is allergic to codeine; hydrocodone; and neosporin [neomycin-bacitracin zn-polymyx].  Meds: Current Outpatient Medications  Medication Sig Dispense Refill  . Artificial Tear Solution (BION TEARS OP) Place 1-2 drops into both eyes as needed (dry eyes).     Marland Kitchen aspirin 81 MG chewable tablet Chew 1 tablet (81 mg total) by mouth 2 (two) times daily. 30 tablet 0  . cetirizine (ZYRTEC) 10 MG tablet Take 10 mg daily by mouth.     . Cholecalciferol (VITAMIN D-3) 1000 UNITS CAPS Take 1,000 Units daily by mouth.     Marland Kitchen Glucosamine-Chondroit-MSM-C-Mn CAPS Take 1 capsule by mouth daily.    . Menthol, Topical Analgesic, (BIOFREEZE EX) Apply 1 application topically daily as needed (leg pain).    . metoprolol tartrate (LOPRESSOR) 25 MG tablet Take 12.5 mg by mouth 2 (two) times daily.    . naphazoline-pheniramine (NAPHCON-A) 0.025-0.3 % ophthalmic solution Place 1-2 drops into both eyes as needed for eye irritation or allergies.     . pravastatin (PRAVACHOL) 40 MG tablet Take 40 mg daily by mouth.     . Probiotic Product (ALIGN) 4 MG CAPS Take 4 mg daily by mouth.    . ranitidine (ZANTAC 75) 75 MG tablet Take 1 tablet (75 mg total) by mouth 2 (two) times daily. 60 tablet 5  . SUPER B COMPLEX & C TABS Take 1 tablet by mouth daily.  0  . zolpidem (AMBIEN) 5 MG tablet Take 5 mg by mouth at bedtime as needed (for sleep.). Taking 1/2 tablet po at night  2  . doxylamine, Sleep, (UNISOM) 25 MG tablet Take 25 mg at bedtime by mouth.    . methocarbamol (ROBAXIN) 500 MG tablet Take 1 tablet (500 mg total) by mouth every 6  (six) hours as needed for muscle spasms. (Patient not taking: Reported on 10/04/2018) 30 tablet 0  . nitroGLYCERIN (NITROSTAT) 0.4 MG SL tablet Place 1 tablet (0.4 mg total) under the tongue every 5 (five) minutes as needed for chest pain. (Patient not taking: Reported on 10/04/2018) 90 tablet 3  . oxyCODONE (OXY IR/ROXICODONE) 5 MG immediate release tablet Take 1-2 tablets (5-10 mg total) by mouth every 4 (four) hours as needed for moderate pain. (Patient not taking: Reported on 10/04/2018) 30 tablet 0   Current Facility-Administered Medications  Medication Dose Route Frequency Provider Last Rate Last Dose  . 0.9 %  sodium chloride infusion  500 mL Intravenous Once Ladene Artist, MD        Physical Findings:  height is 5\' 4"  (1.626 m) and weight is 127 lb 9.6 oz (57.9 kg). Her oral temperature is 97.8 F (36.6 C). Her blood pressure is 100/86 and her pulse is  92. Her respiration is 18 and oxygen saturation is 95%.  Pain Assessment Pain Score: 0-No pain/10 In general this is a well appearing Caucasian female in no acute distress.  She's alert and oriented x4 and appropriate throughout the examination. Cardiopulmonary assessment is negative for acute distress and she exhibits normal effort.  Lungs are clear to auscultation bilaterally with mild expiratory wheezes bilaterally and decreased breath sounds in the LLL.  There is dullness to percussion in the LLL.  EOMs intact bilaterally and PERRLA.  She appears grossly neurologically intact.  Lab Findings: Lab Results  Component Value Date   WBC 7.7 09/01/2018   HGB 11.2 (L) 09/01/2018   HCT 34.0 (L) 09/01/2018   MCV 88.3 09/01/2018   PLT 188 09/01/2018     Radiographic Findings: Ct Chest W Contrast  Result Date: 10/03/2018 CLINICAL DATA:  Lung cancer, radiation therapy complete. Shortness of breath on exertion. EXAM: CT CHEST WITH CONTRAST TECHNIQUE: Multidetector CT imaging of the chest was performed during intravenous contrast  administration. CONTRAST:  20mL OMNIPAQUE IOHEXOL 300 MG/ML  SOLN COMPARISON:  07/09/2018. FINDINGS: Cardiovascular: Atherosclerotic calcification of the arterial vasculature, including coronary arteries. Heart size normal. No pericardial effusion. Mediastinum/Nodes: Mediastinal lymph nodes are not enlarged by CT size criteria. No hilar or axillary adenopathy. Esophagus is grossly unremarkable. Tiny hiatal hernia. Lungs/Pleura: Presumed post radiation confluent soft tissue, bronchiectasis and volume loss in the inferomedial left hemithorax. Heterogeneous collapse/consolidation in the left lower lobe with a large left pleural effusion, stable. Aerated left lung is otherwise unremarkable. Mild scarring and volume loss along the minor fissure. Subpleural nodules in the right lung measure 5 mm or less in size, stable. No right pleural fluid. Airway is unremarkable. Upper Abdomen: Visualized portion of the liver is unremarkable. There may be stones in the gallbladder, which is partially imaged. Right adrenal gland is unremarkable. Nodular left adrenal gland, unchanged. Visualized portion of the right kidney is unremarkable. There may be partially imaged left renal sinus cysts. Visualized portions of spleen, pancreas, stomach and bowel are unremarkable with exception of a tiny hiatal hernia. No upper abdominal adenopathy. Musculoskeletal: No worrisome lytic or sclerotic lesions. IMPRESSION: 1. Presumed post radiation changes in the inferomedial left hemithorax with persistent collapse/consolidation in the left lower lobe and a large left pleural effusion, stable. 2. Aortic atherosclerosis (ICD10-170.0). Coronary artery calcification. 3. Possible cholelithiasis. Electronically Signed   By: Lorin Picket M.D.   On: 10/03/2018 15:19    Impression/Plan: 1. 84 y.o. woman with synchronous NSCLC, adenocarcinoma with a 6 cm LLL mass extending into the left hilum (stage IIB, T3N0M0) and a 2.4 cm RML mass (stage IB T2N0).    She remains stable both clinically and radiographically.  Her most recent CT Chest from 10/03/18 shows stable appearance of presumed post radiation changes in the inferomedial left hemithorax with persistent collapse/consolidation in the left lower lobe and a large left pleural effusion, stable. She denies any progressive shortness of breath or cough. We will plan a follow up Chest CT in 3 months to further assess the LLL collapse/consolidation.  She knows to call immediately for any progressive SOB or concerning symptoms in the interval. She is reluctant to pursue thoracentesis for pleural effusion unless felt absolutely necessary since she remains relatively asymptomatic.  We discussed that should the fluid continue to accumulate at the time of next imaging, we would need to move forward with thoracentesis for therapeutic and diagnostic purposes.  She is in agreement with the stated plan.  2.  Meningioma. This was an incidental finding and patient is currently without neurologic or systemic complaints.  It does not appear to be causing a mass effect on any structures in the brain and remains stable on her most recent follow-up MRI brain from 01/24/2018. The consensus at brain conference from 01/29/2018 is to only repeat brain imaging if she becomes symptomatic.  Otherwise we will just follow this expectantly and only repeat further brain imaging should she develop concerning symptoms. She is in agreement with and is comfortable with this plan.  3. Tachycardia. Her heart rate is improved, at 105, today in the office on low dose Metoprolol.  She did not tolerate the side effects of full dose metoprolol well at all but seems to be tolerating her current low dose fairly well.  This is being managed by her cardiologist, Dr. Peter Martinique.  I will inform Dr. Martinique of recent findings of progressive pleural effusion on recent CT to see if he feels this is at all cardiac related.     Nicholos Johns,  PA-C

## 2018-10-04 NOTE — Addendum Note (Signed)
Encounter addended by: Malena Edman, RN on: 10/04/2018 10:58 AM  Actions taken: Charge Capture section accepted

## 2018-10-05 DIAGNOSIS — M63852 Disorders of muscle in diseases classified elsewhere, left thigh: Secondary | ICD-10-CM | POA: Diagnosis not present

## 2018-10-05 DIAGNOSIS — R278 Other lack of coordination: Secondary | ICD-10-CM | POA: Diagnosis not present

## 2018-10-05 DIAGNOSIS — M62552 Muscle wasting and atrophy, not elsewhere classified, left thigh: Secondary | ICD-10-CM | POA: Diagnosis not present

## 2018-10-05 DIAGNOSIS — M25552 Pain in left hip: Secondary | ICD-10-CM | POA: Diagnosis not present

## 2018-10-05 DIAGNOSIS — R2689 Other abnormalities of gait and mobility: Secondary | ICD-10-CM | POA: Diagnosis not present

## 2018-10-05 DIAGNOSIS — M1612 Unilateral primary osteoarthritis, left hip: Secondary | ICD-10-CM | POA: Diagnosis not present

## 2018-10-10 ENCOUNTER — Encounter (INDEPENDENT_AMBULATORY_CARE_PROVIDER_SITE_OTHER): Payer: Self-pay | Admitting: Orthopaedic Surgery

## 2018-10-10 ENCOUNTER — Ambulatory Visit (INDEPENDENT_AMBULATORY_CARE_PROVIDER_SITE_OTHER): Payer: Medicare Other | Admitting: Orthopaedic Surgery

## 2018-10-10 DIAGNOSIS — Z96642 Presence of left artificial hip joint: Secondary | ICD-10-CM

## 2018-10-10 NOTE — Progress Notes (Signed)
The patient is a very pleasant 83 year old female who is 6 weeks status post a left total hip arthroplasty through direct anterior approach.  She is using a rolling walker but wants to go ahead and get rid of that.  She is not using any type of assistive device before her surgery.  She is ready to drive she states.  She is also read to get in the pool for aquatic therapy.  She does report some burning in her incision but overall is been doing well.  Her incision looks great and I remove the last remaining Steri-Strips.  She tolerates range of motion of her hip on the left side easily.  She can perform straight leg raise on her own as well on the left side.  At this point she will transition from a walker to a cane to hopefully nothing over the next few weeks.  All question concerns were answered and addressed.  We will see her back in 3 months and just have a standing low AP pelvis at that visit.

## 2018-10-11 DIAGNOSIS — R2689 Other abnormalities of gait and mobility: Secondary | ICD-10-CM | POA: Diagnosis not present

## 2018-10-11 DIAGNOSIS — M1612 Unilateral primary osteoarthritis, left hip: Secondary | ICD-10-CM | POA: Diagnosis not present

## 2018-10-11 DIAGNOSIS — M25552 Pain in left hip: Secondary | ICD-10-CM | POA: Diagnosis not present

## 2018-10-11 DIAGNOSIS — M62552 Muscle wasting and atrophy, not elsewhere classified, left thigh: Secondary | ICD-10-CM | POA: Diagnosis not present

## 2018-10-11 DIAGNOSIS — M63852 Disorders of muscle in diseases classified elsewhere, left thigh: Secondary | ICD-10-CM | POA: Diagnosis not present

## 2018-10-11 DIAGNOSIS — R278 Other lack of coordination: Secondary | ICD-10-CM | POA: Diagnosis not present

## 2018-10-12 DIAGNOSIS — Z23 Encounter for immunization: Secondary | ICD-10-CM | POA: Diagnosis not present

## 2018-10-16 ENCOUNTER — Other Ambulatory Visit: Payer: Self-pay | Admitting: Orthopaedic Surgery

## 2018-10-18 DIAGNOSIS — R278 Other lack of coordination: Secondary | ICD-10-CM | POA: Diagnosis not present

## 2018-10-18 DIAGNOSIS — M63852 Disorders of muscle in diseases classified elsewhere, left thigh: Secondary | ICD-10-CM | POA: Diagnosis not present

## 2018-10-18 DIAGNOSIS — M62552 Muscle wasting and atrophy, not elsewhere classified, left thigh: Secondary | ICD-10-CM | POA: Diagnosis not present

## 2018-10-18 DIAGNOSIS — M25552 Pain in left hip: Secondary | ICD-10-CM | POA: Diagnosis not present

## 2018-10-18 DIAGNOSIS — R2689 Other abnormalities of gait and mobility: Secondary | ICD-10-CM | POA: Diagnosis not present

## 2018-10-18 DIAGNOSIS — M1612 Unilateral primary osteoarthritis, left hip: Secondary | ICD-10-CM | POA: Diagnosis not present

## 2018-10-24 ENCOUNTER — Telehealth (INDEPENDENT_AMBULATORY_CARE_PROVIDER_SITE_OTHER): Payer: Self-pay

## 2018-10-24 NOTE — Telephone Encounter (Signed)
Called and asked for Dr. Trevor Mace dental ABX protocol. Pt is s/p a total hip 08/2018 advised amoxicillin 500 mg 2 po 1 hr prior to procedure and then 2 po 6 hours after procedure. Will call with any other questions.

## 2018-10-30 ENCOUNTER — Other Ambulatory Visit: Payer: Self-pay | Admitting: Urology

## 2018-10-30 ENCOUNTER — Other Ambulatory Visit: Payer: Self-pay | Admitting: Radiation Oncology

## 2018-10-30 DIAGNOSIS — Z853 Personal history of malignant neoplasm of breast: Secondary | ICD-10-CM

## 2018-11-02 ENCOUNTER — Other Ambulatory Visit: Payer: Self-pay | Admitting: Orthopaedic Surgery

## 2018-11-02 NOTE — Progress Notes (Signed)
Cardiology Office Note   Date:  11/05/2018   ID:  [name] French, DOB [birth year], MRN [id number]  PCP:  Haywood Pao, MD  Cardiologist:   Camren Henthorn Martinique, MD   Chief Complaint  Patient presents with  . Palpitations      History of Present Illness: [name] French is a 84 y.o. female is seen for follow up  of tachycardia. She has no known history of cardiac disease. She has a history of Lung CA receiving RT. Not felt to be a candidate for chemo or surgery. She was admitted overnight 12/25/17 for evaluation of atypical chest pain. CT negative for PE. There was a left pleural effusion. troponins and Ecg were unremarkable. Echo was normal. She was noted to have PACs and mention of a run of NSVT. She was started on metoprolol. Her chest pain was felt to be esophageal.  She was seen by GI 01/25/18 for evaluation of a abnormal colon finding on PET scan. Evaluation never completed.  It was noted that her HR was elevated to ? 150. She had stopped taking metoprolol 2 days prior. No Ecg recorded.   She was  started  on metoprolol at 12.5 mg bid. Initially seemed to do well without symptoms of tachycardia. Since her last visit multiple phone calls stating metoprolol made her feel very fatigued. "I can't put one foot in front of the other" Attempts at tapering dose resulted in increase in HR. Now taking 12.5 mg daily but by next am HR in the 130s.  She did undergo a Myoview study in April 2019 due to chest pain and it was normal. Subsequent CT scans have shown left lung collapse with a large left effusion- stable.   In September she had a THR and this went very well.   On follow up today she is doing very well. Able to walk better now post THR. Notes pulse will be as high as 130 in the morning but slows down once she takes her metoprolol. Only gets SOB when she is carrying groceries.      Past Medical History:  Diagnosis Date  . Allergic rhinitis   . Anemia    during pregnancy  . Arthritis    . Breast cancer ([place]) 05/08/13   right upper inner, invasive mammary  . Breast cancer, right breast ([place]) 04/16/2013   Underwent lumpectomy on 11/06/13. Path showed G1 ILC, 2.7 cm, neg margins, receptor+, Her2neg   . Diverticulosis   . 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 Hemorrhoids   . Hx of adenomatous colonic polyps 05/2002  . Lung cancer ([place])   . Lung cancer ([place]) 10/2017  . Meningioma ([place])   . Osteoporosis   . Pneumonia    walking pneumonia  . Rosacea   . 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 Use of letrozole ([place])    neoadjuvant antiestrogen therapy with letrozole 2.5 mg daily x 7 monhts    Past Surgical History:  Procedure Laterality Date  . BREAST BIOPSY Left 1960   lt br bx/benign  . BREAST EXCISIONAL BIOPSY Left   . BREAST LUMPECTOMY Right   . BREAST LUMPECTOMY WITH NEEDLE LOCALIZATION Bilateral 11/06/2013   Procedure: BREAST LUMPECTOMY WITH NEEDLE LOCALIZATION;  Surgeon: Haywood Lasso, MD;  Location: [facility];  Service: General;  Laterality: Bilateral;  . COLONOSCOPY    . EYE SURGERY     both cataracts  . MOHS SURGERY Right    nose  basal/squamous  . TOTAL HIP ARTHROPLASTY Left 08/31/2018   Procedure: LEFT TOTAL HIP ARTHROPLASTY ANTERIOR APPROACH;  Surgeon: Mcarthur Rossetti, MD;  [facility];  Service: Orthopedics;  Laterality: Left;  Marland Kitchen VIDEO BRONCHOSCOPY WITH ENDOBRONCHIAL NAVIGATION N/A 11/01/2017   Procedure: VIDEO BRONCHOSCOPY WITH ENDOBRONCHIAL NAVIGATION;  Surgeon: [name], MD;  Location: [facility];  Service: Thoracic;  Laterality: N/A;  . WRIST SURGERY  1990   lt     Current Outpatient Medications  Medication Sig Dispense Refill  . Artificial Tear Solution (BION TEARS OP) Place 1-2 drops into both eyes as needed (dry eyes).     Marland Kitchen aspirin 81 MG tablet Take 81 mg by mouth daily.    . cetirizine (ZYRTEC) 10 MG tablet Take 10 mg daily by mouth.     . Cholecalciferol (VITAMIN D-3) 1000 UNITS CAPS Take 1,000  Units daily by mouth.     . doxylamine, Sleep, (UNISOM) 25 MG tablet Take 25 mg at bedtime by mouth.    Marland Kitchen Glucosamine-Chondroit-MSM-C-Mn CAPS Take 1 capsule by mouth daily.    . Menthol, Topical Analgesic, (BIOFREEZE EX) Apply 1 application topically daily as needed (leg pain).    . methocarbamol (ROBAXIN) 500 MG tablet Take 1 tablet (500 mg total) by mouth every 6 (six) hours as needed for muscle spasms. 30 tablet 0  . metoprolol tartrate (LOPRESSOR) 25 MG tablet Take 12.5 mg by mouth 2 (two) times daily.    . naphazoline-pheniramine (NAPHCON-A) 0.025-0.3 % ophthalmic solution Place 1-2 drops into both eyes as needed for eye irritation or allergies.     . nitroGLYCERIN (NITROSTAT) 0.4 MG SL tablet Place 1 tablet (0.4 mg total) under the tongue every 5 (five) minutes as needed for chest pain. 90 tablet 3  . oxyCODONE (OXY IR/ROXICODONE) 5 MG immediate release tablet Take 1-2 tablets (5-10 mg total) by mouth every 4 (four) hours as needed for moderate pain. 30 tablet 0  . pravastatin (PRAVACHOL) 40 MG tablet Take 40 mg daily by mouth.     . Probiotic Product (ALIGN) 4 MG CAPS Take 4 mg daily by mouth.    . ranitidine (ZANTAC 75) 75 MG tablet Take 1 tablet (75 mg total) by mouth 2 (two) times daily. 60 tablet 5  . SUPER B COMPLEX & C TABS Take 1 tablet by mouth daily.  0  . zolpidem (AMBIEN) 5 MG tablet Take 5 mg by mouth at bedtime as needed (for sleep.). Taking 1/2 tablet po at night  2   Current Facility-Administered Medications  Medication Dose Route Frequency Provider Last Rate Last Dose  . 0.9 %  sodium chloride infusion  500 mL Intravenous Once Ladene Artist, MD        Allergies:   Codeine; Hydrocodone; and Neosporin [neomycin-bacitracin zn-polymyx]    Social History:  The patient  reports that she quit smoking about 39 years ago. Her smoking use included cigarettes. She has a 12.50 pack-year smoking history. She has never used smokeless tobacco. She reports that she drinks about 4.0  standard drinks of alcohol per week. She reports that she does not use drugs.   Family History:  The patient's family history includes Heart disease in her mother; Lung cancer in her sister; Prostate cancer in her father.    ROS:  Please see the history of present illness.   Otherwise, review of systems are positive for none.   All other systems are reviewed and negative.    PHYSICAL EXAM: VS:  BP 106/60  Pulse 90   Ht 5\' 4"  (1.626 m)   Wt 126 lb (57.2 kg)   BMI 21.63 kg/m  , BMI Body mass index is 21.63 kg/m. GENERAL:  Well appearing EF in NAD HEENT:  PERRL, EOMI, sclera are clear. Oropharynx is clear. NECK:  No jugular venous distention, carotid upstroke brisk and symmetric, no bruits, no thyromegaly or adenopathy LUNGS:  Decreased BS left base.  CHEST:  Unremarkable HEART:  RRR,  PMI not displaced or sustained,S1 and S2 within normal limits, no S3, no S4: no clicks, no rubs, no murmurs ABD:  Soft, nontender. BS +, no masses or bruits. No hepatomegaly, no splenomegaly EXT:  2 + pulses throughout, no edema, no cyanosis no clubbing SKIN:  Warm and dry.  No rashes NEURO:  Alert and oriented x 3. Cranial nerves II through XII intact. PSYCH:  Cognitively intact   EKG:  EKG is not  ordered today.    Recent Labs: 03/12/2018: TSH 2.780 09/01/2018: Hemoglobin 11.2; Platelets 188 10/03/2018: BUN 9; Creatinine 0.75; Potassium 4.3; Sodium 138    Lipid Panel    Component Value Date/Time   CHOL 158 12/26/2017 0025   TRIG 167 (H) 12/26/2017 0025   HDL 51 12/26/2017 0025   CHOLHDL 3.1 12/26/2017 0025   VLDL 33 12/26/2017 0025   LDLCALC 74 12/26/2017 0025    Dated 04/02/18: cholesterol 188, triglycerides 146, HDL 48, LDL 111. CBC and chemistries normal.  Wt Readings from Last 3 Encounters:  11/05/18 126 lb (57.2 kg)  10/04/18 127 lb 9.6 oz (57.9 kg)  09/04/18 133 lb (60.3 kg)      Other studies Reviewed: Additional studies/ records that were reviewed today include:   Echo:  12/26/17:Study Conclusions  - Left ventricle: The cavity size was normal. Systolic function was   normal. The estimated ejection fraction was in the range of 60%   to 65%. Wall motion was normal; there were no regional wall   motion abnormalities. Left ventricular diastolic function   parameters were normal. - Aortic valve: There was mild regurgitation. - Atrial septum: No defect or patent foramen ovale was identified.   Myoview 03/23/18: Study Highlights    Nuclear stress EF: 56%.  Clinically and electrically negative for ischemia  Normal perfusiion No ischemia or scar  This is a low risk study.     EXAM: CT CHEST WITH CONTRAST  TECHNIQUE: Multidetector CT imaging of the chest was performed during intravenous contrast administration.  CONTRAST:  4mL OMNIPAQUE IOHEXOL 300 MG/ML  SOLN  COMPARISON:  07/09/2018.  FINDINGS: Cardiovascular: Atherosclerotic calcification of the arterial vasculature, including coronary arteries. Heart size normal. No pericardial effusion.  Mediastinum/Nodes: Mediastinal lymph nodes are not enlarged by CT size criteria. No hilar or axillary adenopathy. Esophagus is grossly unremarkable. Tiny hiatal hernia.  Lungs/Pleura: Presumed post radiation confluent soft tissue, bronchiectasis and volume loss in the inferomedial left hemithorax. Heterogeneous collapse/consolidation in the left lower lobe with a large left pleural effusion, stable. Aerated left lung is otherwise unremarkable. Mild scarring and volume loss along the minor fissure. Subpleural nodules in the right lung measure 5 mm or less in size, stable. No right pleural fluid. Airway is unremarkable.  Upper Abdomen: Visualized portion of the liver is unremarkable. There may be stones in the gallbladder, which is partially imaged. Right adrenal gland is unremarkable. Nodular left adrenal gland, unchanged. Visualized portion of the right kidney is unremarkable. There may be  partially imaged left renal sinus cysts. Visualized portions of spleen, pancreas, stomach and  bowel are unremarkable with exception of a tiny hiatal hernia. No upper abdominal adenopathy.  Musculoskeletal: No worrisome lytic or sclerotic lesions.  IMPRESSION: 1. Presumed post radiation changes in the inferomedial left hemithorax with persistent collapse/consolidation in the left lower lobe and a large left pleural effusion, stable. 2. Aortic atherosclerosis (ICD10-170.0). Coronary artery calcification. 3. Possible cholelithiasis.   Electronically Signed   By: Lorin Picket M.D.   On: 10/03/2018 15:19   ASSESSMENT AND PLAN:  1. Sinus tachycardia. Inappropriate. Patient had some rebound tachycardia after stopping metoprolol abruptly. HR improved on metoprolol 12.5 mg bid. Symptoms are improved.   Echo was OK. Continue current therapy. 2. Lung CA s/p RT. Stable by follow up CT in October. 3. HTN controlled.  4. Chest pain. Resolved.   She does have significant coronary Calcification noted on CT. Myoview study showed normal perfusion.    Current medicines are reviewed at length with the patient today.  The patient does not have concerns regarding medicines.   No orders of the defined types were placed in this encounter.    Disposition:   FU 6 months  Signed, Hiedi Touchton Martinique, MD  11/05/2018 10:42 AM    [facility] [address] [place], [place], [address] Phone [phone], Fax [phone]

## 2018-11-05 ENCOUNTER — Encounter: Payer: Self-pay | Admitting: Cardiology

## 2018-11-05 ENCOUNTER — Ambulatory Visit (INDEPENDENT_AMBULATORY_CARE_PROVIDER_SITE_OTHER): Payer: Medicare Other | Admitting: Cardiology

## 2018-11-05 VITALS — BP 106/60 | HR 90 | Ht 64.0 in | Wt 126.0 lb

## 2018-11-05 DIAGNOSIS — R0602 Shortness of breath: Secondary | ICD-10-CM

## 2018-11-05 DIAGNOSIS — R000 Tachycardia, unspecified: Secondary | ICD-10-CM | POA: Diagnosis not present

## 2018-11-05 NOTE — Patient Instructions (Addendum)
Continue your current therapy  Follow up in 6 months   

## 2018-11-19 ENCOUNTER — Other Ambulatory Visit: Payer: Self-pay | Admitting: Cardiology

## 2018-11-20 ENCOUNTER — Ambulatory Visit
Admission: RE | Admit: 2018-11-20 | Discharge: 2018-11-20 | Disposition: A | Discharge status: Home / Self Care | Payer: Medicare Other | Source: Ambulatory Visit | Attending: Urology | Admitting: Urology

## 2018-11-20 DIAGNOSIS — Z853 Personal history of malignant neoplasm of breast: Secondary | ICD-10-CM

## 2018-11-20 DIAGNOSIS — R922 Inconclusive mammogram: Secondary | ICD-10-CM | POA: Diagnosis not present

## 2018-12-14 ENCOUNTER — Other Ambulatory Visit: Payer: Self-pay | Admitting: Orthopaedic Surgery

## 2018-12-25 ENCOUNTER — Telehealth: Payer: Self-pay | Admitting: *Deleted

## 2018-12-25 NOTE — Telephone Encounter (Signed)
Returned patient's phone call, spoke with patient 

## 2019-01-03 DIAGNOSIS — H1033 Unspecified acute conjunctivitis, bilateral: Secondary | ICD-10-CM | POA: Diagnosis not present

## 2019-01-03 IMAGING — CT CT ANGIO CHEST
2 of 8 series · 15 of 46 positions shown · IV contrast (ISOVUE)
Comparison: Chest CT 10/11/2017 and PET-CT 10/20/2017

CLINICAL DATA: Intermittent chest pain since [REDACTED]. History of
lung cancer, undergoing radiation therapy.

EXAM:
CT ANGIOGRAPHY CHEST WITH CONTRAST
TECHNIQUE: Multidetector CT imaging of the chest was performed using the
standard protocol during bolus administration of intravenous
contrast. Multiplanar CT image reconstructions and MIPs were
obtained to evaluate the vascular anatomy.
CONTRAST:  75mL 4ZQTOI-V1J IOPAMIDOL (4ZQTOI-V1J) INJECTION 76%,
80mL 4ZQTOI-V1J IOPAMIDOL (4ZQTOI-V1J) INJECTION 76%

[Series 14: thins · axial · 0.62mm/px · z∈[+968,+1214]mm · 12 of 291 slices shown]
[im 23/291  lung]
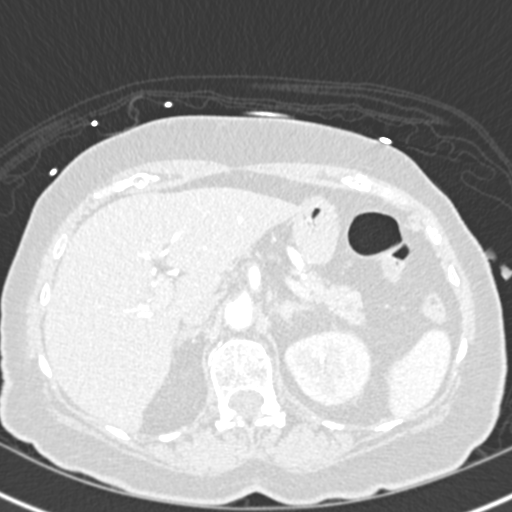
[im 45/291  soft-tissue]
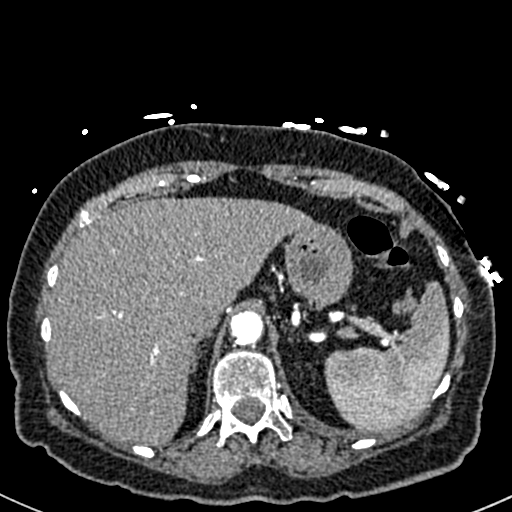
[im 67/291  lung]
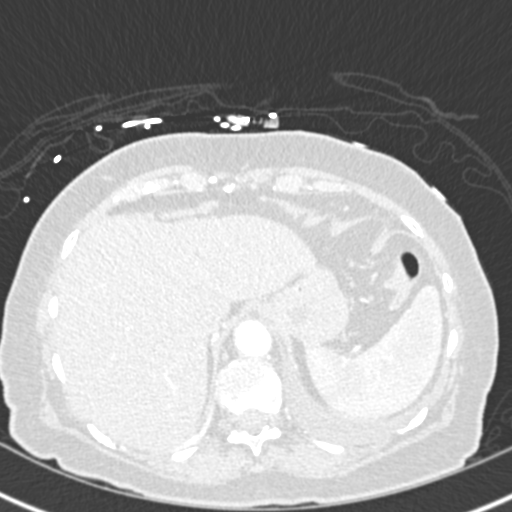
[im 90/291  soft-tissue]
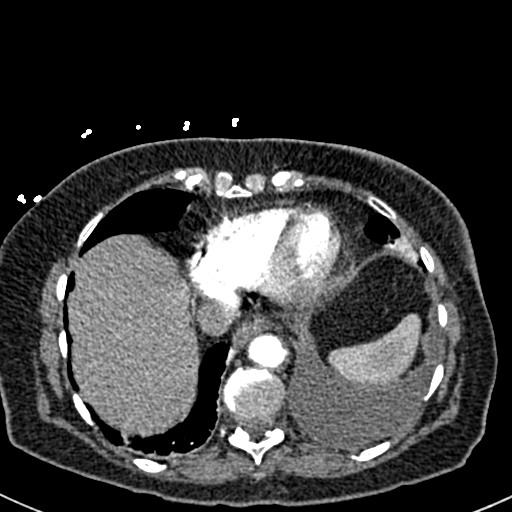
[im 112/291  lung]
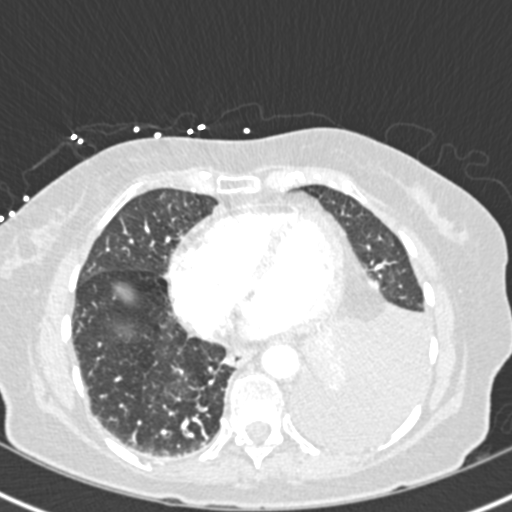
[im 134/291  soft-tissue]
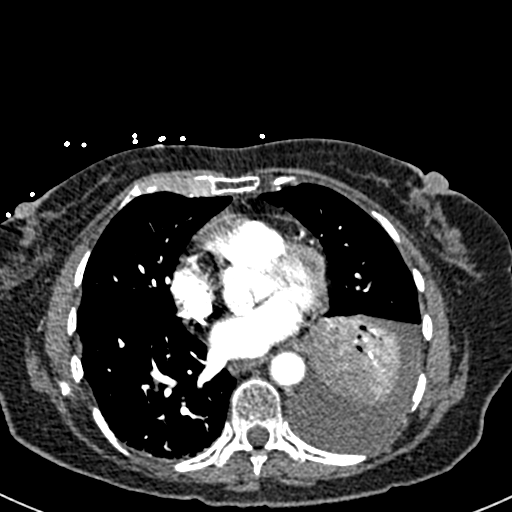
[im 157/291  lung]
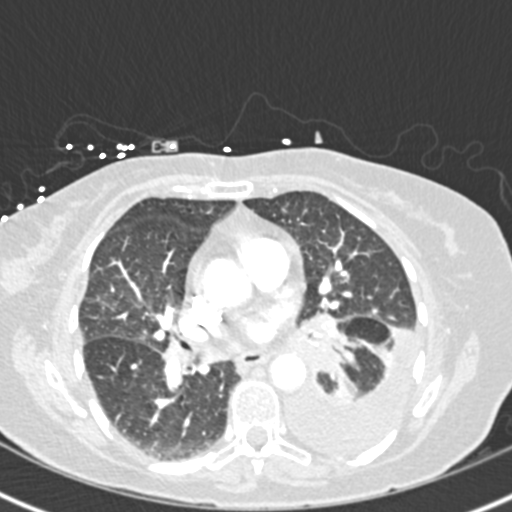
[im 179/291  soft-tissue]
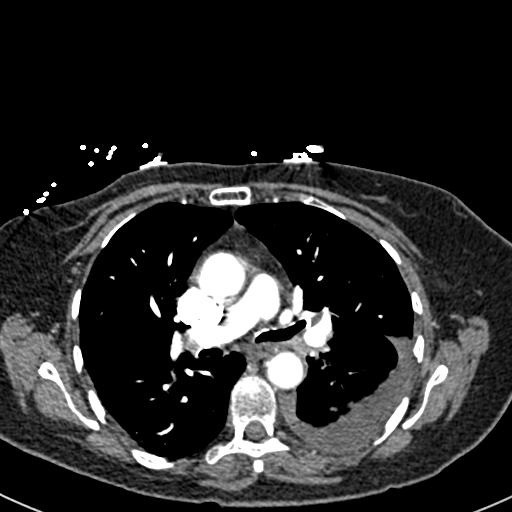
[im 201/291  lung]
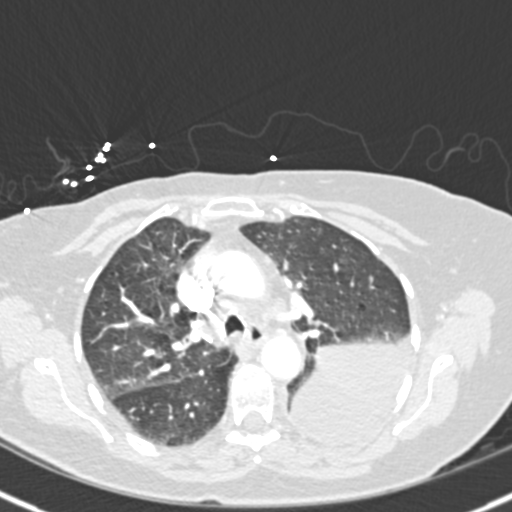
[im 224/291  soft-tissue]
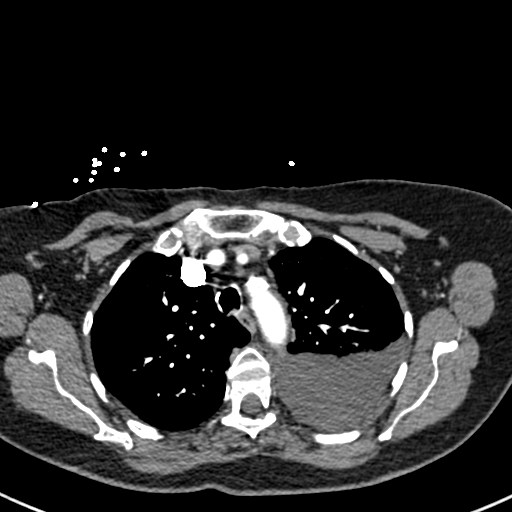
[im 246/291  lung]
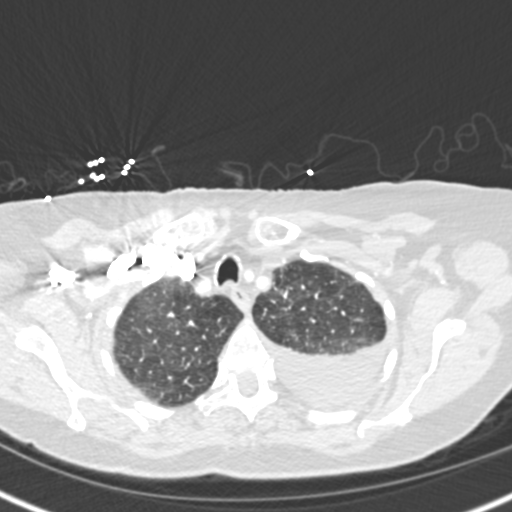
[im 268/291  soft-tissue]
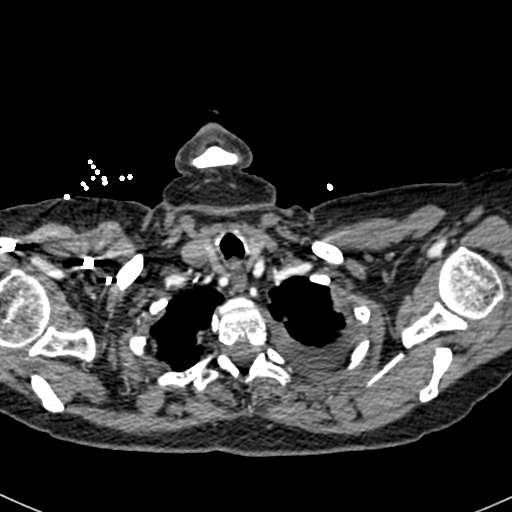

[Series 16: coronal mpr · coronal · 0.58mm/px · 3 of 130 slices shown]
[im 33/130  soft-tissue]
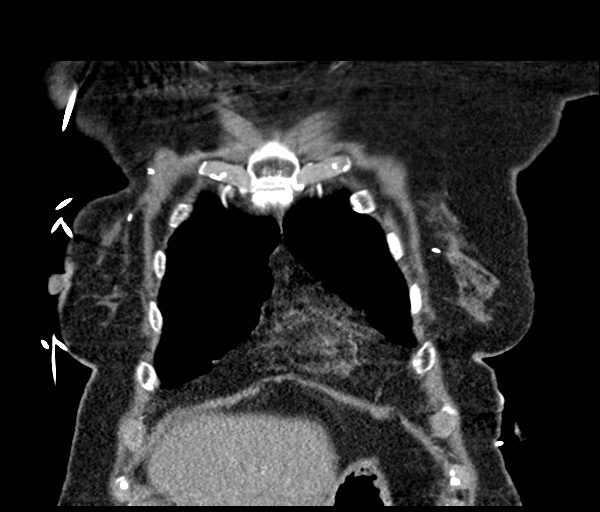
[im 65/130  soft-tissue]
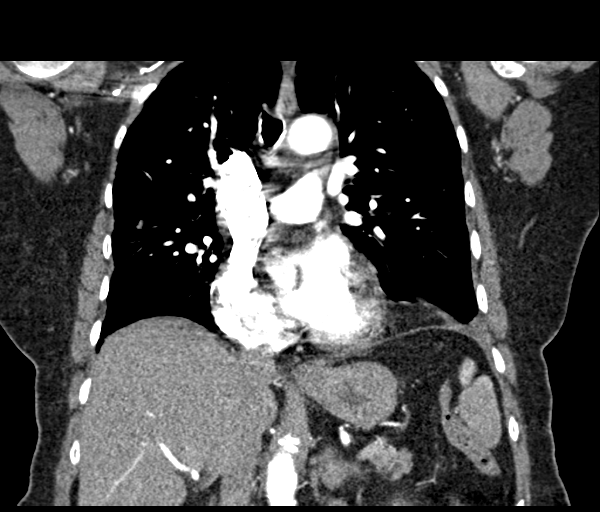
[im 97/130  soft-tissue]
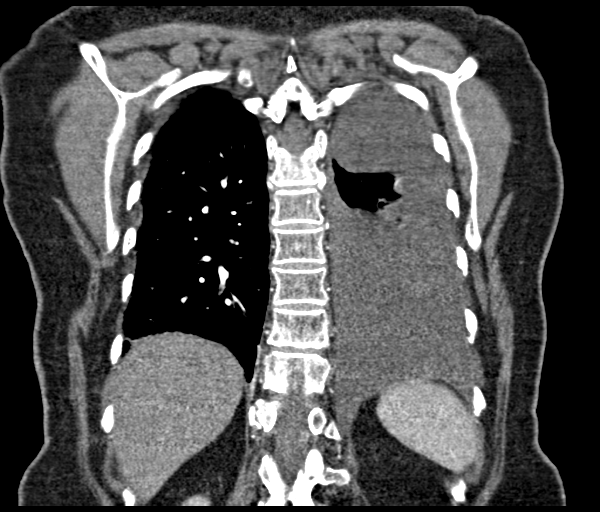

[15 of 46 positions shown; findings below may reference images not displayed]

FINDINGS: Cardiovascular: The heart is normal in size. No pericardial
effusion. Stable aortic and coronary artery calcifications.

The pulmonary arterial tree is suboptimally opacified but no obvious
large or central filling defects to suggest pulmonary embolism.

Mediastinum/Nodes: Stable small scattered mediastinal and hilar
lymph nodes. Prevascular lymph node on image number 33 measures 6 mm
hand precarinal lymph node on image number 33 measures 7 mm. The
esophagus is grossly normal.

Lungs/Pleura: Lesion left infrahilar mass is smaller. It measures
approximately 3.4 x 2.9 cm on image number 45. The previously
measured 5.4 x 4.8 cm. Persistence endobronchial soft tissue density
and mass effect on the lingula and lower lobe bronchi. Associated
left lower lobe atelectasis and a persistent small to moderate size
left pleural effusion.

14.5 x 10 mm right middle lobe nodular ground-glass opacity is new
since the prior study and this most likely inflammatory. No
worrisome pulmonary nodules to suggest pulmonary metastatic disease.

Upper Abdomen: No significant upper abdominal findings but very
little of the upper abdomen is image.

Musculoskeletal: No breast masses, supraclavicular or axillary
adenopathy. The thyroid gland appears normal.

The bony thorax is intact. No worrisome bone lesions or acute bony
findings.

Review of the MIP images confirms the above findings.
IMPRESSION: 1. No CT findings for pulmonary embolism but suboptimal
opacification of the pulmonary arterial tree.
2. Interval decrease in size of the left hilar/infrahilar lung mass.
3. Persistent left lower lobe atelectasis and small to moderate size
left pleural effusion.
4. New nodular air ground-glass opacity in the right middle lobe,
likely inflammatory but attention on future scans is suggested.
5. Stable small mediastinal and hilar lymph nodes.

Aortic Atherosclerosis (2WE3O-GV0.0) and Emphysema (2WE3O-T54.P).

## 2019-01-03 IMAGING — CR DG CHEST 2V
2 series · 2 of 2 positions shown · non-contrast
Comparison: 11/01/2017

CLINICAL DATA: Chest pain, shortness of breath.

EXAM:
CHEST  2 VIEW

[w chest pa]
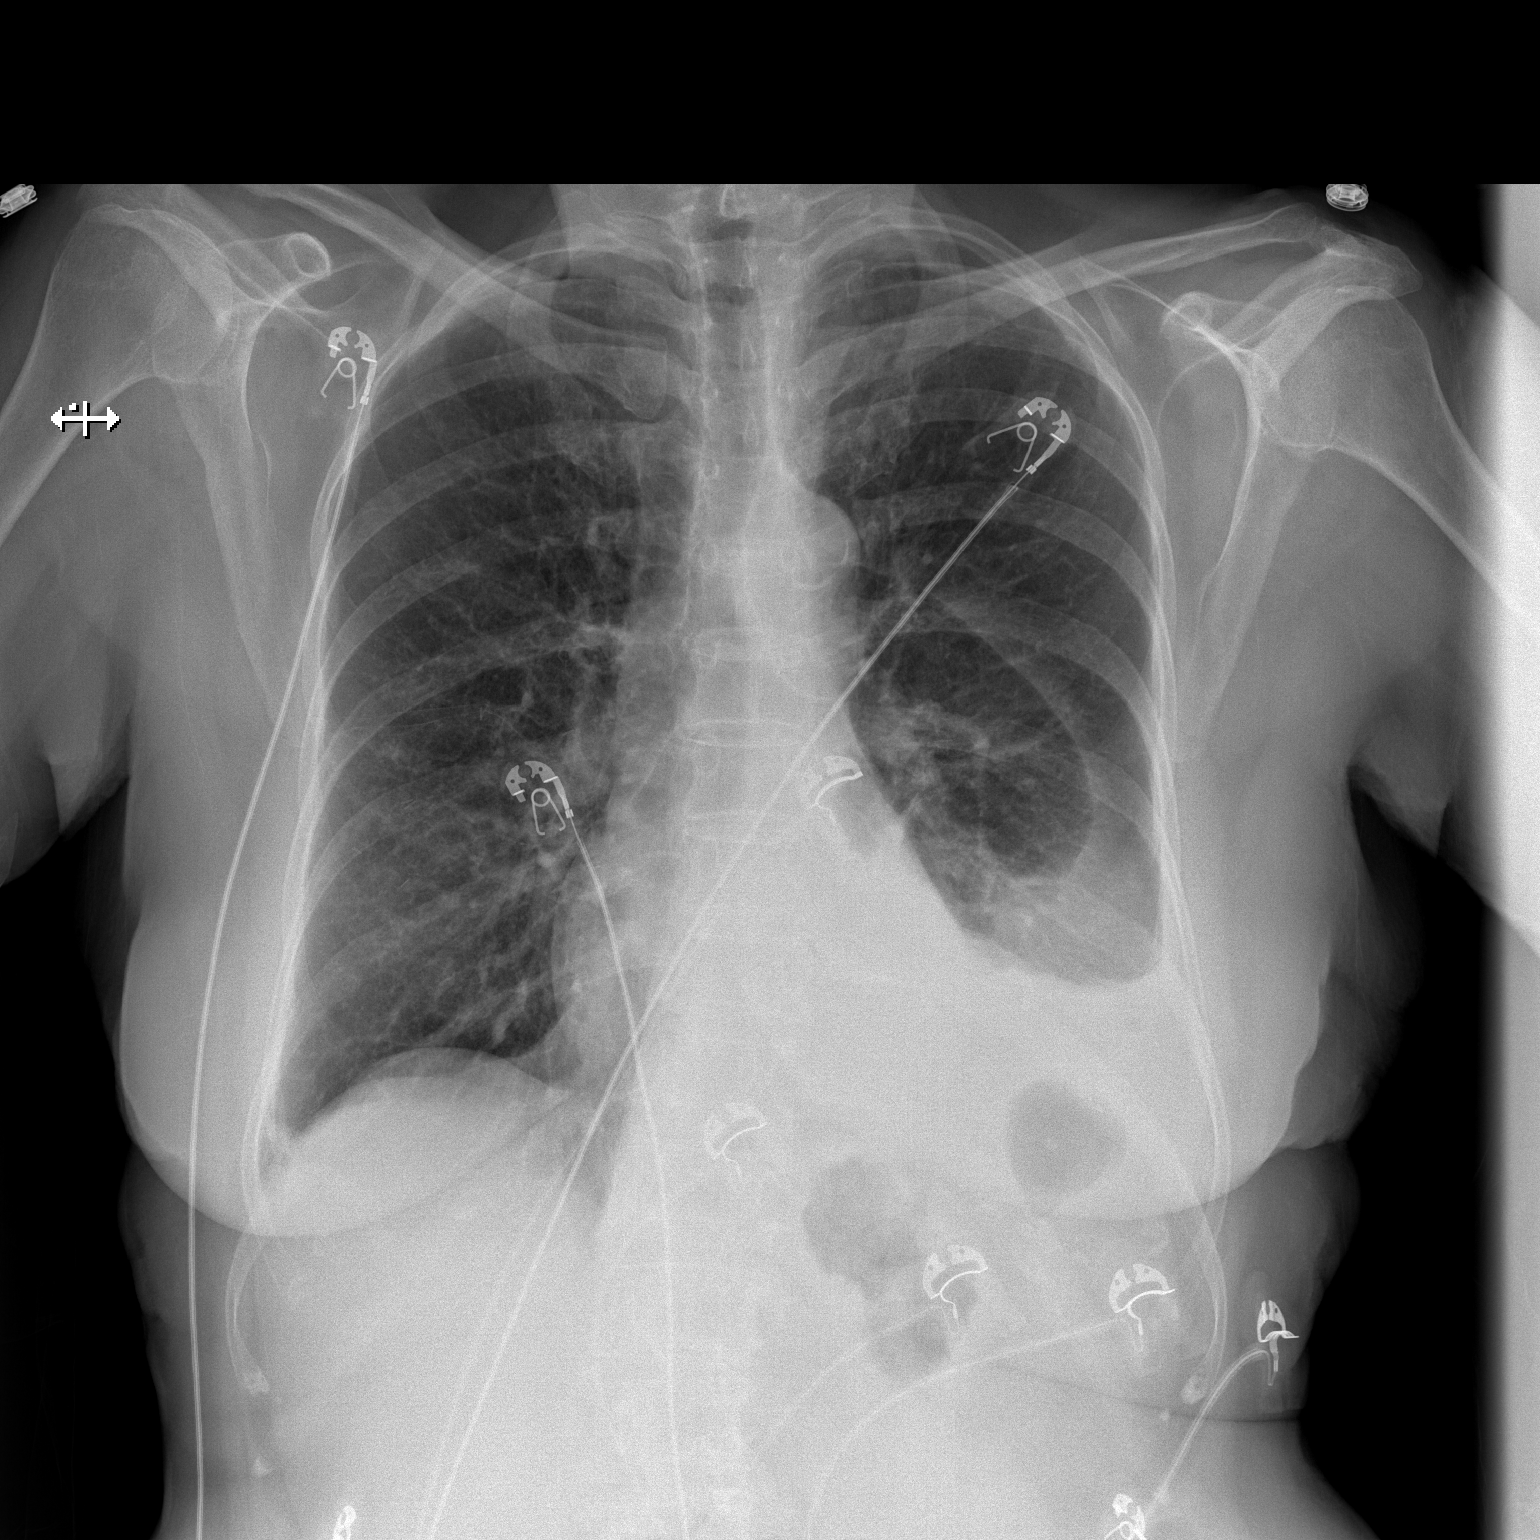

[w chest lat]
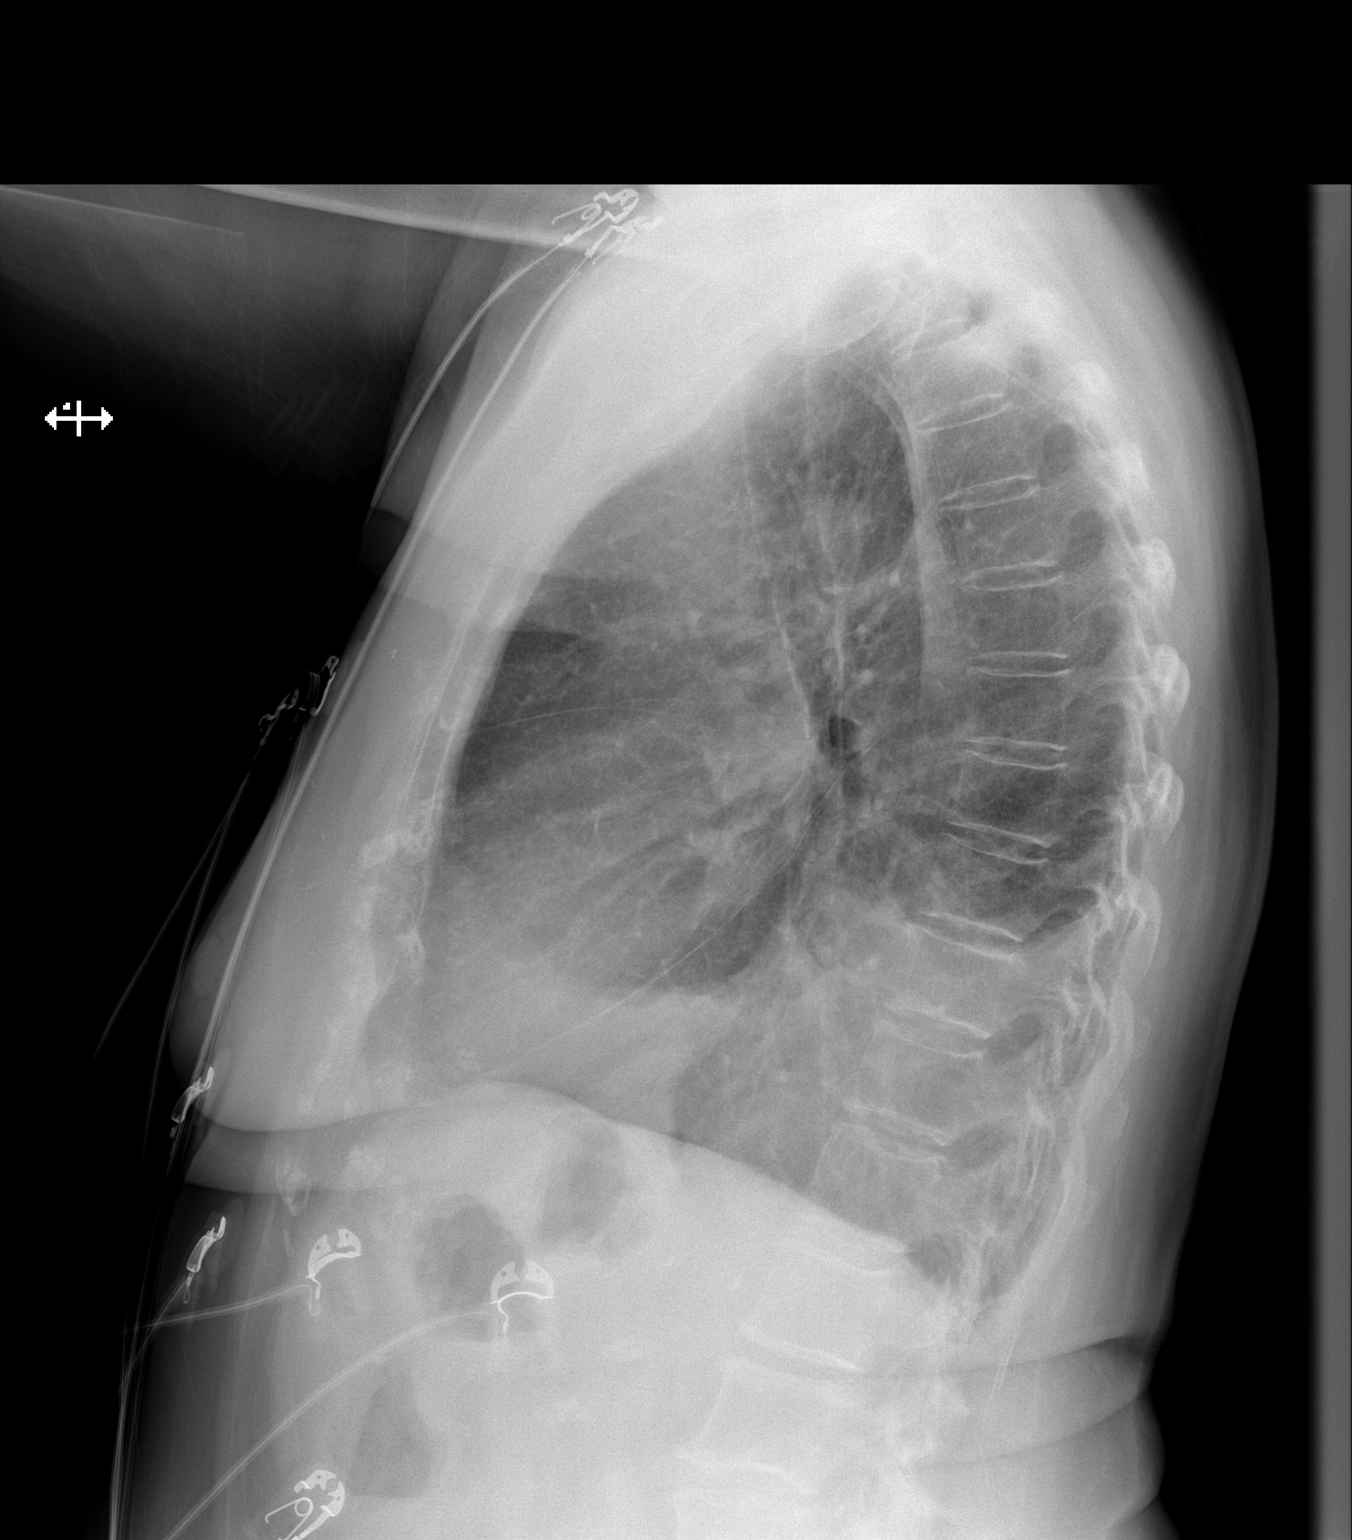

[2 of 2 positions shown; findings below may reference images not displayed]

FINDINGS: Moderate left pleural effusion with left lower lobe atelectasis. No
right effusion. Heart is normal size. No acute bony abnormality.
IMPRESSION: Moderate left pleural effusion with left lower lobe atelectasis.

## 2019-01-04 ENCOUNTER — Ambulatory Visit: Payer: Self-pay | Admitting: Urology

## 2019-01-08 ENCOUNTER — Telehealth: Payer: Self-pay | Admitting: *Deleted

## 2019-01-08 NOTE — Telephone Encounter (Signed)
CALLED PATIENT TO INFORM OF LAB FOR 01-16-19 @ 11:45 AM @ [facility] AND HER CT ON 01-16-19 - ARRIVAL TIME- 12:45 PM @ [facility], PT. TO HAVE WATER ONLY - 4 HRS. PRIOR TO TEST, AND PATIENT TO FU WITH ASHLYN BRUNING FOR RESULTS ON 01-17-19 @ 1:30 PM, SPOKE WITH PATIENT AND SHE IS AWARE OF THESE APPTS.

## 2019-01-09 ENCOUNTER — Encounter (INDEPENDENT_AMBULATORY_CARE_PROVIDER_SITE_OTHER): Payer: Self-pay | Admitting: Orthopaedic Surgery

## 2019-01-09 ENCOUNTER — Ambulatory Visit: Payer: Medicare Other | Admitting: Urology

## 2019-01-09 ENCOUNTER — Ambulatory Visit (INDEPENDENT_AMBULATORY_CARE_PROVIDER_SITE_OTHER): Payer: Medicare Other

## 2019-01-09 ENCOUNTER — Ambulatory Visit (INDEPENDENT_AMBULATORY_CARE_PROVIDER_SITE_OTHER): Payer: Medicare Other | Admitting: Orthopaedic Surgery

## 2019-01-09 DIAGNOSIS — Z96642 Presence of left artificial hip joint: Secondary | ICD-10-CM

## 2019-01-09 NOTE — Progress Notes (Signed)
Office Visit Note   Patient: [name] French           Date of Birth: [birth year]           MRN: [id number] Visit Date: 01/09/2019              Requested by: Haywood Pao, MD [address] [place], [place] [address] PCP: [name], MD   Assessment & Plan: Visit Diagnoses:  1. History of total hip arthroplasty, left     Plan: She will follow-up with [place] at 1 year postop sooner if there is any questions or concerns.  Questions were encouraged and answered today at length.  Follow-Up Instructions: Return in about 8 months (around 09/10/2019) for Radiographs.   Orders:  Orders Placed This Encounter  Procedures  . XR Pelvis 1-2 Views   No orders of the defined types were placed in this encounter.     Procedures: No procedures performed   Clinical Data: No additional findings.   Subjective: Chief Complaint  Patient presents with  . Left Hip - Follow-up    HPI [name] French is a 85 year old female comes in today status post left total hip arthroplasty now 4  months postop.  She states overall she is doing well she has good range of motion and strength of the hip.  States she has occasional burning about the incision but this is very rare.  She is using no assistive devices to ambulate. Review of Systems Please see HPI otherwise negative or noncontributory  Objective: Vital Signs: There were no vitals taken for this visit.  Physical Exam General: No acute distress.  Ortho Exam Left hip good range of motion without pain.  Calf supple nontender.  Ambulates with a nonantalgic gait without any assistive device. Specialty Comments:  No specialty comments available.  Imaging: No results found.   PMFS History: Patient Active Problem List   Diagnosis Date Noted  . Senile osteoporosis 09/14/2018  . Sleep disturbances 09/14/2018  . History of breast cancer 09/14/2018  . ACP (advance care planning) 09/14/2018  . Status post total replacement of left hip  08/31/2018  . Unilateral primary osteoarthritis, left hip 08/06/2018  . Pleural effusion on left 07/11/2018  . Chest pain 12/25/2017  . Abnormal findings on radiological examination of gastrointestinal tract 11/16/2017  . Primary cancer of left lower lobe of lung ([place]) 11/15/2017  . Non-small cell cancer of right lung ([place]) 11/07/2017  . Lung mass 10/17/2017  . Breast cancer of upper-inner quadrant of right female breast ([place]) 06/10/2013  . Mass of breast, left 05/31/2013  . PERSONAL HISTORY OF COLONIC POLYPS 07/26/2010   Past Medical History:  Diagnosis Date  . Allergic rhinitis   . Anemia    during pregnancy  . Arthritis   . Breast cancer ([place]) 05/08/13   right upper inner, invasive mammary  . Breast cancer, right breast ([place]) 04/16/2013   Underwent lumpectomy on 11/06/13. Path showed G1 ILC, 2.7 cm, neg margins, receptor+, Her2neg   . Diverticulosis   . 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 Hemorrhoids   . Hx of adenomatous colonic polyps 05/2002  . Lung cancer ([place])   . Lung cancer ([place]) 10/2017  . Meningioma ([place])   . Osteoporosis   . Pneumonia    walking pneumonia  . Rosacea   . 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 Use of letrozole ([place])    neoadjuvant antiestrogen therapy with letrozole 2.5 mg daily x 7  monhts    Family History  Problem Relation Age of Onset  . Heart disease Mother   . Prostate cancer Father   . Lung cancer Sister     Past Surgical History:  Procedure Laterality Date  . BREAST BIOPSY Left 1960   lt br bx/benign  . BREAST EXCISIONAL BIOPSY Left   . BREAST LUMPECTOMY Right   . BREAST LUMPECTOMY WITH NEEDLE LOCALIZATION Bilateral 11/06/2013   Procedure: BREAST LUMPECTOMY WITH NEEDLE LOCALIZATION;  Surgeon: Haywood Lasso, MD;  Location: [facility];  Service: General;  Laterality: Bilateral;  . COLONOSCOPY    . EYE SURGERY     both cataracts  . MOHS SURGERY Right    nose basal/squamous  . TOTAL HIP ARTHROPLASTY Left  08/31/2018   Procedure: LEFT TOTAL HIP ARTHROPLASTY ANTERIOR APPROACH;  Surgeon: Mcarthur Rossetti, MD;  Location[facility];  Service: Orthopedics;  Laterality: Left;  Marland Kitchen VIDEO BRONCHOSCOPY WITH ENDOBRONCHIAL NAVIGATION N/A 11/01/2017   Procedure: VIDEO BRONCHOSCOPY WITH ENDOBRONCHIAL NAVIGATION;  Surgeon: [name], MD;  Location: [facility];  Service: Thoracic;  Laterality: N/A;  . WRIST SURGERY  1990   lt   Social History   Occupational History  . Occupation: Retired  Tobacco Use  . Smoking status: Former Smoker    Packs/day: 0.50    Years: 25.00    Pack years: 12.50    Types: Cigarettes    Last attempt to quit: 12/19/1978    Years since quitting: 40.0  . Smokeless tobacco: Never Used  Substance and Sexual Activity  . Alcohol use: Yes    Alcohol/week: 4.0 standard drinks    Types: 4 Glasses of wine per week  . Drug use: No  . Sexual activity: Not Currently    Comment: menarche at age14, menopause in 83, HRT x 31, age at first live birth 74's, G73 P3

## 2019-01-14 NOTE — Progress Notes (Signed)
   [name] French is here for a follow-up appointment today.     Signs/Symptoms  Weight changes, None  Respiratory complaints, if any: Some sob with activity  Hemoptysis No  Pain issues, if any: No  Difficulty with swallowing No Vitals:   01/17/19 1333  BP: 116/69  Pulse: (!) 110  Resp: 20  Temp: 98.3 F (36.8 C)  TempSrc: Oral  SpO2: 94%  Weight: 126 lb 12.8 oz (57.5 kg)  Height: 5\' 5"  (1.651 m)   Wt Readings from Last 3 Encounters:  01/17/19 126 lb 12.8 oz (57.5 kg)  11/05/18 126 lb (57.2 kg)  10/04/18 127 lb 9.6 oz (57.9 kg)

## 2019-01-16 ENCOUNTER — Encounter (HOSPITAL_COMMUNITY): Payer: Self-pay

## 2019-01-16 ENCOUNTER — Ambulatory Visit
Admission: RE | Admit: 2019-01-16 | Discharge: 2019-01-16 | Disposition: A | Discharge status: Home / Self Care | Payer: Medicare Other | Source: Ambulatory Visit | Attending: Urology | Admitting: Urology

## 2019-01-16 ENCOUNTER — Ambulatory Visit (HOSPITAL_COMMUNITY)
Admission: RE | Admit: 2019-01-16 | Discharge: 2019-01-16 | Disposition: A | Discharge status: Home / Self Care | Payer: Medicare Other | Source: Ambulatory Visit | Attending: Urology | Admitting: Urology

## 2019-01-16 DIAGNOSIS — C3491 Malignant neoplasm of unspecified part of right bronchus or lung: Secondary | ICD-10-CM

## 2019-01-16 DIAGNOSIS — C3432 Malignant neoplasm of lower lobe, left bronchus or lung: Secondary | ICD-10-CM

## 2019-01-16 DIAGNOSIS — C3490 Malignant neoplasm of unspecified part of unspecified bronchus or lung: Secondary | ICD-10-CM | POA: Diagnosis not present

## 2019-01-16 LAB — BUN & CREATININE (CHCC)
BUN: 10 mg/dL (ref 8–23)
Creatinine: 0.75 mg/dL (ref 0.44–1.00)
GFR, Estimated: >60 mL/min (ref >60)

## 2019-01-16 MED ORDER — IOHEXOL 300 MG/ML  SOLN
75.0000 mL | Freq: Once | INTRAMUSCULAR | Status: AC | PRN
  Administered 2019-01-16: 75 mL via INTRAVENOUS

## 2019-01-16 MED ORDER — SODIUM CHLORIDE (PF) 0.9 % IJ SOLN
INTRAMUSCULAR | Status: AC
  Filled 2019-01-16: qty 50

## 2019-01-17 ENCOUNTER — Other Ambulatory Visit: Payer: Self-pay

## 2019-01-17 ENCOUNTER — Encounter: Payer: Self-pay | Admitting: Urology

## 2019-01-17 ENCOUNTER — Ambulatory Visit
Admission: RE | Admit: 2019-01-17 | Discharge: 2019-01-17 | Disposition: A | Discharge status: Home / Self Care | Payer: Medicare Other | Source: Ambulatory Visit | Attending: Urology | Admitting: Urology

## 2019-01-17 VITALS — BP 116/69 | HR 110 | Temp 98.3°F | Resp 20 | Ht 65.0 in | Wt 126.8 lb

## 2019-01-17 DIAGNOSIS — Z7982 Long term (current) use of aspirin: Secondary | ICD-10-CM | POA: Diagnosis not present

## 2019-01-17 DIAGNOSIS — D330 Benign neoplasm of brain, supratentorial: Secondary | ICD-10-CM | POA: Diagnosis not present

## 2019-01-17 DIAGNOSIS — Z923 Personal history of irradiation: Secondary | ICD-10-CM | POA: Insufficient documentation

## 2019-01-17 DIAGNOSIS — K449 Diaphragmatic hernia without obstruction or gangrene: Secondary | ICD-10-CM | POA: Insufficient documentation

## 2019-01-17 DIAGNOSIS — R000 Tachycardia, unspecified: Secondary | ICD-10-CM | POA: Insufficient documentation

## 2019-01-17 DIAGNOSIS — R55 Syncope and collapse: Secondary | ICD-10-CM | POA: Diagnosis not present

## 2019-01-17 DIAGNOSIS — Z79899 Other long term (current) drug therapy: Secondary | ICD-10-CM | POA: Insufficient documentation

## 2019-01-17 DIAGNOSIS — C342 Malignant neoplasm of middle lobe, bronchus or lung: Secondary | ICD-10-CM | POA: Diagnosis not present

## 2019-01-17 DIAGNOSIS — C3432 Malignant neoplasm of lower lobe, left bronchus or lung: Secondary | ICD-10-CM

## 2019-01-17 DIAGNOSIS — Z08 Encounter for follow-up examination after completed treatment for malignant neoplasm: Secondary | ICD-10-CM | POA: Diagnosis not present

## 2019-01-17 DIAGNOSIS — D320 Benign neoplasm of cerebral meninges: Secondary | ICD-10-CM | POA: Insufficient documentation

## 2019-01-17 DIAGNOSIS — I700 Atherosclerosis of aorta: Secondary | ICD-10-CM | POA: Insufficient documentation

## 2019-01-17 DIAGNOSIS — C3491 Malignant neoplasm of unspecified part of right bronchus or lung: Secondary | ICD-10-CM

## 2019-01-17 DIAGNOSIS — J90 Pleural effusion, not elsewhere classified: Secondary | ICD-10-CM | POA: Diagnosis not present

## 2019-01-17 DIAGNOSIS — R918 Other nonspecific abnormal finding of lung field: Secondary | ICD-10-CM | POA: Insufficient documentation

## 2019-01-17 NOTE — Progress Notes (Signed)
[name] French  Radiation Oncology         [phone]) [phone] ________________________________  Name: [name] French MRN: [id number]  Date: 01/17/2019  DOB: May 08, 1926  Post Treatment Note  CC: Tisovec, Fransico Him, MD  Marcy Panning, MD  Diagnosis:   85 y.o. woman with newly diagnosed synchronous NSCLC, adenocarcinoma with a 6 cm LLL mass extending into the left hilum (stage IIB, T3N0M0) and a 2.4 cm RML mass (stage IB T2N0).     Interval Since Last Radiation:  1 year 11/28/17 - 01/18/18:  66 Gy directed to the LLL and RML lung lesions in 33 fractions of 2 Gy each.  Narrative:  The patient returns today for routine follow-up and review of recent CT Chest from 01/16/19 which shows overall disease stability without evidence of disease progression or recurrence.  She has not received any systemic treatment thus far, as the feeling was that concurrent chemoradiation would be too toxic for the patient due to her age.  However, molecular studies were ordered to assess for a potential role of immunotherapy if she demonstrated PD1 expression greater than 50%.  Unfortunately, her molecular studies returned showing only a very low expression of PD 1 at 5%.  There were no other identifiable mutations noted.  She completed a 6.5 week course of radiotherapy which she tolerated relatively well and remains in observation with Dr. Julien Nordmann.        In summary, she has a remote history of breast cancer diagnosed and treated in 2014, an was initially seen in consult on 11/15/18 at the request of Dr. Lindi Adie for newly diagnosed non-small cell lung cancer and incidental finding of meningioma. She presented to her PCP in October 2018 with persistent dry cough that led to an isolated episode of hemoptysis. She denied further episodes of hemoptysis since that time. She presented to her PCP for evaluation and was being worked up for possible pneumonia with a chest x-ray and a CT scan of the chest. CT done on 10/11/2017 showed a 6.0 cm LLL  lung mass with additional lung nodules, including a 2.4 cm pulmonary nodule in the RML as well as 0.3 cm satellite nodules. There was also a hypodense 0.4 cm RLL lesion. A PET CT scan on 112/18 demonstrated a hypermetabolic LLL mass extending into the left hilum with SUV of 26.8 (stage IIb, T3N0M0) and a mildly hypermetabolic RML mass with SUV max of 1.9 (stage IB T2N0).  There were no hypermetabolic hilar or mediastinal nodes.  She was evaluated with Dr. Roxan Hockey and underwent bilateral navigational bronchoscopy with biopsies of the lung masses on 11/01/17 which revealed bilateral non-small cell lung cancer, poorly differentiated adenocarcinoma.   An MRI brain was performed on 10/20/2017 for disease staging and incidentally showed a right inferior parietal convexity meningioma measuring 2.9 x 2.7 x 1.4 cm. There was no evidence of metastatic disease. Her imaging was reviewed at the multidisciplinary brain conference and consensus recommendation was to repeat a scan in 3 months to assess for progression/enlargement. Repeat brain MRI was performed on 01/24/2018 demonstrating a persistent right temporoparietal meningioma unchanged in size with only mild vasogenic edema which appears improved from her previous study.  There was no evidence of metastatic disease to the brain.  This study was reviewed at the multidisciplinary brain conference on 01/29/2018 and consensus recommendation was to only repeat brain imaging if the patient develops symptoms.  On review of systems, the patient states that she is doing well overall. Currently, she denies increased shortness of breath, orthopnea, chest pain, dysphagia, fever, chills, cough or hemoptysis.  She has baseline shortness of breath with exertion and fatigue which has remained unchanged since 10/2017.  She has continued to tolerate the dose adjusted metoprolol for tachycardia. She denies headaches, changes in visual or auditory acuity, difficulty  with speech or word finding, imbalance, tinnitus, tremor or seizure activity.    ALLERGIES:  is allergic to codeine; hydrocodone; and neosporin [neomycin-bacitracin zn-polymyx].  Meds: Current Outpatient Medications  Medication Sig Dispense Refill  . Artificial Tear Solution (BION TEARS OP) Place 1-2 drops into both eyes as needed (dry eyes).     Marland Kitchen aspirin 81 MG tablet Take 81 mg by mouth daily.    . cetirizine (ZYRTEC) 10 MG tablet Take 10 mg daily by mouth.     . Cholecalciferol (VITAMIN D-3) 1000 UNITS CAPS Take 1,000 Units daily by mouth.     . doxylamine, Sleep, (UNISOM) 25 MG tablet Take 25 mg at bedtime by mouth.    Marland Kitchen Glucosamine-Chondroit-MSM-C-Mn CAPS Take 1 capsule by mouth daily.    . Menthol, Topical Analgesic, (BIOFREEZE EX) Apply 1 application topically daily as needed (leg pain).    . metoprolol tartrate (LOPRESSOR) 25 MG tablet Take 12.5 mg by mouth 2 (two) times daily.    . naphazoline-pheniramine (NAPHCON-A) 0.025-0.3 % ophthalmic solution Place 1-2 drops into both eyes as needed for eye irritation or allergies.     . pravastatin (PRAVACHOL) 40 MG tablet Take 40 mg daily by mouth.     . Probiotic Product (ALIGN) 4 MG CAPS Take 4 mg daily by mouth.    . SUPER B COMPLEX & C TABS Take 1 tablet by mouth daily.  0  . zolpidem (AMBIEN) 5 MG tablet Take 5 mg by mouth at bedtime as needed (for sleep.). Taking 1/2 tablet po at night  2  . methocarbamol (ROBAXIN) 500 MG tablet Take 1 tablet (500 mg total) by mouth every 6 (six) hours as needed for muscle spasms. (Patient not taking: Reported on 01/17/2019) 30 tablet 0  . metoprolol tartrate (LOPRESSOR) 25 MG tablet TAKE ONE TABLET TWICE DAILY (Patient not taking: Reported on 01/17/2019) 60 tablet 6  . nitroGLYCERIN (NITROSTAT) 0.4 MG SL tablet Place 1 tablet (0.4 mg total) under the tongue every 5 (five) minutes as needed for chest pain. 90 tablet 3  . oxyCODONE (OXY IR/ROXICODONE) 5 MG immediate release tablet Take 1-2 tablets (5-10 mg  total) by mouth every 4 (four) hours as needed for moderate pain. (Patient not taking: Reported on 01/17/2019) 30 tablet 0  . ranitidine (ZANTAC 75) 75 MG tablet Take 1 tablet (75 mg total) by mouth 2 (two) times daily. (Patient not taking: Reported on 01/17/2019) 60 tablet 5   Current Facility-Administered Medications  Medication Dose Route Frequency Provider Last Rate Last Dose  . 0.9 %  sodium chloride infusion  500 mL Intravenous Once Ladene Artist, MD        Physical Findings:  height is 5\' 5"  (1.651 m) and weight is 126 lb 12.8 oz (57.5 kg). Her oral temperature is 98.3 F (36.8 C). Her blood pressure is 116/69 and her pulse is 110 (abnormal). Her respiration is 20 and oxygen saturation is 94%.  Pain Assessment Pain Score: 0-No pain/10 In general this is a well appearing Caucasian female in no acute distress.  She's alert and oriented x4 and appropriate throughout  the examination. Cardiopulmonary assessment is negative for acute distress and she exhibits normal effort.  Lungs are clear to auscultation bilaterally with mild expiratory wheezes bilaterally and decreased breath sounds in the LLL.  There is dullness to percussion in the LLL.  EOMs intact bilaterally and PERRLA.  She appears grossly neurologically intact.  Lab Findings: Lab Results  Component Value Date   WBC 7.7 09/01/2018   HGB 11.2 (L) 09/01/2018   HCT 34.0 (L) 09/01/2018   MCV 88.3 09/01/2018   PLT 188 09/01/2018     Radiographic Findings: Ct Chest W Contrast  Result Date: 01/16/2019 CLINICAL DATA:  Non-small-cell lung cancer. EXAM: CT CHEST WITH CONTRAST TECHNIQUE: Multidetector CT imaging of the chest was performed during intravenous contrast administration. CONTRAST:  23mL OMNIPAQUE IOHEXOL 300 MG/ML  SOLN COMPARISON:  10/03/2018 FINDINGS: Cardiovascular: The heart size is normal. No substantial pericardial effusion. Coronary artery calcification is evident. Atherosclerotic calcification is noted in the wall of  the thoracic aorta. Mediastinum/Nodes: Small mediastinal lymph nodes are similar to prior. No right hilar lymphadenopathy. Soft tissue in the left hilum is similar to prior. Tiny hiatal hernia. The esophagus has normal imaging features. There is no axillary lymphadenopathy. Lungs/Pleura: The central tracheobronchial airways are patent. Platelike atelectasis or scarring in the right middle lobe along the minor fissure is stable. Moderate left pleural effusion is similar to prior. The left parahilar consolidative change is also similar likely reflecting radiation fibrosis. The left lower lobe collapse is similar. A new 3 mm subpleural left lung nodule is identified on 80/7. Upper Abdomen: Unremarkable. Musculoskeletal: No worrisome lytic or sclerotic osseous abnormality. IMPRESSION: 1. No substantial interval change in exam. 2. Collapse/consolidation in the parahilar left lung likely related to radiation therapy. This is similar to prior but continued attention on follow-up recommended. 3. Similar appearance of left lower lobe collapse. 4. New 3 mm subpleural nodule left upper lung, indeterminate but attention on follow-up recommended. 5.  Aortic Atherosclerois (ICD10-170.0) Electronically Signed   By: Misty Stanley M.D.   On: 01/16/2019 17:09    Impression/Plan: 1. 85 y.o. woman with synchronous NSCLC, adenocarcinoma with a 6 cm LLL mass extending into the left hilum (stage IIB, T3N0M0) and a 2.4 cm RML mass (stage IB T2N0).  She remains stable both clinically and radiographically.  Her most recent CT Chest from 01/16/19 shows stable appearance of presumed post radiation changes in the inferomedial left hemithorax with persistent collapse/consolidation in the left lower lobe and an unchanged moderate left pleural effusion, stable. We will plan a follow up Chest CT in 6 months to further assess the LLL collapse/consolidation and continue to monitor for any disease progression or recurrence.  She knows to call  immediately for any progressive SOB or concerning symptoms in the interval. She is reluctant to pursue thoracentesis for pleural effusion unless felt absolutely necessary since she remains relatively asymptomatic.  We discussed that should the fluid continue to accumulate, we would need to consider moving forward with thoracentesis for therapeutic and diagnostic purposes.  She is in agreement with the stated plan.  2.         Meningioma. This was an incidental finding and patient is currently without neurologic or systemic complaints.  It does not appear to be causing a mass effect on any structures in the brain and remains stable on MRI brain from 01/24/2018. The consensus at brain conference from 01/29/2018 is to only repeat brain imaging if she becomes symptomatic.  Otherwise we will just follow this expectantly  and only repeat further brain imaging should she develop concerning symptoms. She is in agreement with and is comfortable with this plan.  3. Tachycardia. Her heart rate is improved, at 110, today in the office on low dose Metoprolol.  She did not tolerate the side effects of full dose metoprolol well at all but seems to be tolerating her current low dose fairly well.  This is being managed by her cardiologist, Dr. Peter Martinique.     Nicholos Johns, PA-C

## 2019-01-18 DIAGNOSIS — H1033 Unspecified acute conjunctivitis, bilateral: Secondary | ICD-10-CM | POA: Diagnosis not present

## 2019-04-10 DIAGNOSIS — R82998 Other abnormal findings in urine: Secondary | ICD-10-CM | POA: Diagnosis not present

## 2019-04-10 DIAGNOSIS — E7800 Pure hypercholesterolemia, unspecified: Secondary | ICD-10-CM | POA: Diagnosis not present

## 2019-04-10 DIAGNOSIS — M859 Disorder of bone density and structure, unspecified: Secondary | ICD-10-CM | POA: Diagnosis not present

## 2019-04-10 DIAGNOSIS — Z79899 Other long term (current) drug therapy: Secondary | ICD-10-CM | POA: Diagnosis not present

## 2019-04-15 DIAGNOSIS — C3432 Malignant neoplasm of lower lobe, left bronchus or lung: Secondary | ICD-10-CM | POA: Diagnosis not present

## 2019-04-15 DIAGNOSIS — Z96642 Presence of left artificial hip joint: Secondary | ICD-10-CM | POA: Diagnosis not present

## 2019-04-15 DIAGNOSIS — C7989 Secondary malignant neoplasm of other specified sites: Secondary | ICD-10-CM | POA: Diagnosis not present

## 2019-04-15 DIAGNOSIS — M8580 Other specified disorders of bone density and structure, unspecified site: Secondary | ICD-10-CM | POA: Diagnosis not present

## 2019-04-15 DIAGNOSIS — H269 Unspecified cataract: Secondary | ICD-10-CM | POA: Diagnosis not present

## 2019-04-15 DIAGNOSIS — Z853 Personal history of malignant neoplasm of breast: Secondary | ICD-10-CM | POA: Diagnosis not present

## 2019-04-15 DIAGNOSIS — Z1331 Encounter for screening for depression: Secondary | ICD-10-CM | POA: Diagnosis not present

## 2019-04-15 DIAGNOSIS — G4700 Insomnia, unspecified: Secondary | ICD-10-CM | POA: Diagnosis not present

## 2019-04-15 DIAGNOSIS — E7800 Pure hypercholesterolemia, unspecified: Secondary | ICD-10-CM | POA: Diagnosis not present

## 2019-04-15 DIAGNOSIS — Z0000 Encounter for general adult medical examination without abnormal findings: Secondary | ICD-10-CM | POA: Diagnosis not present

## 2019-04-15 DIAGNOSIS — N183 Chronic kidney disease, stage 3 (moderate): Secondary | ICD-10-CM | POA: Diagnosis not present

## 2019-04-22 ENCOUNTER — Telehealth: Payer: Self-pay | Admitting: *Deleted

## 2019-04-22 NOTE — Telephone Encounter (Signed)
04/22/19 LMOM @ 12:19 pm.re: follow up appointment.

## 2019-05-07 ENCOUNTER — Telehealth: Payer: Self-pay | Admitting: Physician Assistant

## 2019-05-07 NOTE — Telephone Encounter (Signed)
Home phone/ consent/ my chart active/ pre reg completed

## 2019-05-08 ENCOUNTER — Encounter: Payer: Self-pay | Admitting: Physician Assistant

## 2019-05-08 ENCOUNTER — Telehealth (INDEPENDENT_AMBULATORY_CARE_PROVIDER_SITE_OTHER): Payer: Medicare Other | Admitting: Physician Assistant

## 2019-05-08 ENCOUNTER — Telehealth: Payer: Self-pay

## 2019-05-08 VITALS — BP 130/75 | HR 90 | Ht 65.0 in | Wt 127.0 lb

## 2019-05-08 DIAGNOSIS — C3490 Malignant neoplasm of unspecified part of unspecified bronchus or lung: Secondary | ICD-10-CM

## 2019-05-08 DIAGNOSIS — J90 Pleural effusion, not elsewhere classified: Secondary | ICD-10-CM | POA: Diagnosis not present

## 2019-05-08 DIAGNOSIS — R002 Palpitations: Secondary | ICD-10-CM | POA: Diagnosis not present

## 2019-05-08 DIAGNOSIS — C50919 Malignant neoplasm of unspecified site of unspecified female breast: Secondary | ICD-10-CM | POA: Diagnosis not present

## 2019-05-08 NOTE — Patient Instructions (Signed)
Medication Instructions:   Your physician recommends that you continue on your current medications as directed. Please refer to the Current Medication list given to you today.  If you need a refill on your cardiac medications before your next appointment, please call your pharmacy.   Lab work:  NONE ordered at this time of appointment   If you have labs (blood work) drawn today and your tests are completely normal, you will receive your results only by: Marland Kitchen MyChart Message (if you have MyChart) OR . A paper copy in the mail If you have any lab test that is abnormal or we need to change your treatment, we will call you to review the results.  Testing/Procedures:  NONE ordered at this time of appointment   Follow-Up: At [facility], you and your health needs are our priority.  As part of our continuing mission to provide you with exceptional heart care, we have created designated Provider Care Teams.  These Care Teams include your primary Cardiologist (physician) and Advanced Practice Providers (APPs -  Physician Assistants and Nurse Practitioners) who all work together to provide you with the care you need, when you need it. You will need a follow up appointment in 6 months.  Please call our office 2 months in advance to schedule this appointment.  You may see Peter Martinique, MD or one of the following Advanced Practice Providers on your designated Care Team: [place], [place] . Fabian Sharp, PA-C  Any Other Special Instructions Will Be Listed Below (If Applicable).

## 2019-05-08 NOTE — Progress Notes (Signed)
Virtual Visit via Telephone Note   This visit type was conducted due to national recommendations for restrictions regarding the COVID-19 Pandemic (e.g. social distancing) in an effort to limit this patient's exposure and mitigate transmission in our community.  Due to her co-morbid illnesses, this patient is at least at moderate risk for complications without adequate follow up.  This format is felt to be most appropriate for this patient at this time.  The patient did not have access to video technology/had technical difficulties with video requiring transitioning to audio format only (telephone).  All issues noted in this document were discussed and addressed.  No physical exam could be performed with this format.  Please refer to the patient's chart for her  consent to telehealth for [name] French.   Date:  05/08/2019   ID:  [name] French, DOB 06/15/1926, MRN [id number]  Patient Location: Home Provider Location: Home  PCP:  Tisovec, Fransico Him, MD  Cardiologist:  Peter Martinique, MD  Electrophysiologist:  None   Evaluation Performed:  Follow-Up Visit  Chief Complaint:  followup  History of Present Illness:    ARIETTA French is a 85 y.o. female with past medical history of breast cancer status post lumpectomy, lung cancer s/p radiation therapy (no chemotherapy given her advanced age) and history of palpitation.  Despite her advanced age, she does not have prior history of CAD.  She was admitted in January 2019 for atypical chest pain.  CT of the chest was negative for PE, although there was a left pleural effusion.  EKG was unchanged and a troponin was negative.  Echocardiogram obtained during the admission was normal.  She was noted to have PACs and runs of nonsustained VT.  She was placed on metoprolol for rhythm suppression.  Her chest pain was felt to be esophageal in nature.  Myoview obtained in April 2019 was normal.  Subsequent CT scan showed left lung collapse with large left  pleural effusion.  Patient was last seen by Dr. Martinique in November 2019, she reportedly had some rebound tachycardia after stopping the metoprolol, this improved on metoprolol 12.5 mg twice daily.  Patient also reported some fatigue and weakness on the same metoprolol dosage.  Repeat CT of the chest was done in January 2020 this revealed moderate left pleural effusion with collapsed lung, this appears to be stable when compared to the previous image and continued monitoring was recommended.  Patient currently resides in [facility] living facility.  She does have some dyspnea extreme exertion however able to complete everyday activity without too much issue.  She denies any recent chest discomfort.  She is currently on Toprol tartrate 12.5 mg twice daily and has not noticed any weakness on this dose nor has she had any recurrent palpitation.  Overall, I think she is doing well from cardiology perspective and can follow-up with Dr. Martinique in 6 months.  The patient does not have symptoms concerning for COVID-19 infection (fever, chills, cough, or new shortness of breath).    Past Medical History:  Diagnosis Date   Allergic rhinitis    Anemia    during pregnancy   Arthritis    Breast cancer ([place]) 05/08/13   right upper inner, invasive mammary   Breast cancer, right breast ([place]) 04/16/2013   Underwent lumpectomy on 11/06/13. 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   Use of letrozole ([place])    neoadjuvant antiestrogen therapy with letrozole 2.5 mg daily x 7 monhts   Past Surgical History:  Procedure Laterality Date   BREAST BIOPSY Left 1960   lt br bx/benign    BREAST EXCISIONAL BIOPSY Left    BREAST LUMPECTOMY Right    BREAST LUMPECTOMY WITH NEEDLE LOCALIZATION Bilateral 11/06/2013   Procedure: BREAST LUMPECTOMY WITH NEEDLE LOCALIZATION;  Surgeon: Haywood Lasso, MD;  Location: [facility];  Service: General;  Laterality: Bilateral;   COLONOSCOPY     EYE SURGERY     both cataracts   MOHS SURGERY Right    nose basal/squamous   TOTAL HIP ARTHROPLASTY Left 08/31/2018   Procedure: LEFT TOTAL HIP ARTHROPLASTY ANTERIOR APPROACH;  Surgeon: Mcarthur Rossetti, MD;  Location: [facility];  Service: Orthopedics;  Laterality: Left;   VIDEO BRONCHOSCOPY WITH ENDOBRONCHIAL NAVIGATION N/A 11/01/2017   Procedure: VIDEO BRONCHOSCOPY WITH ENDOBRONCHIAL NAVIGATION;  Surgeon: [name], MD;  Location: [facility];  Service: Thoracic;  Laterality: N/A;   WRIST SURGERY  1990   lt     Current Meds  Medication Sig   Artificial Tear Solution (BION TEARS OP) Place 1-2 drops into both eyes as needed (dry eyes).    aspirin 81 MG tablet Take 81 mg by mouth daily.   cetirizine (ZYRTEC) 10 MG tablet Take 10 mg daily by mouth.    Cholecalciferol (VITAMIN D-3) 1000 UNITS CAPS Take 1,000 Units daily by mouth.    doxylamine, Sleep, (UNISOM) 25 MG tablet Take 25 mg at bedtime by mouth.   Glucosamine-Chondroit-MSM-C-Mn CAPS Take 1 capsule by mouth daily.   Menthol, Topical Analgesic, (BIOFREEZE EX) Apply 1 application topically daily as needed (leg pain).   methocarbamol (ROBAXIN) 500 MG tablet Take 1 tablet (500 mg total) by mouth every 6 (six) hours as needed for muscle spasms.   metoprolol tartrate (LOPRESSOR) 25 MG tablet Take 12.5 mg by mouth 2 (two) times daily.   naphazoline-pheniramine (NAPHCON-A) 0.025-0.3 % ophthalmic solution Place 1-2 drops into both eyes as needed for eye irritation or allergies.    pravastatin (PRAVACHOL) 40 MG tablet Take 40 mg daily by mouth.    Probiotic Product (ALIGN) 4 MG CAPS Take 4 mg daily by  mouth.   SUPER B COMPLEX & C TABS Take 1 tablet by mouth daily.   zolpidem (AMBIEN) 5 MG tablet Take 5 mg by mouth at bedtime as needed (for sleep.). Taking 1/2 tablet po at night   Current Facility-Administered Medications for the 05/08/19 encounter (Telemedicine) with Almyra Deforest, PA  Medication   0.9 %  sodium chloride infusion     Allergies:   Codeine; Hydrocodone; and Neosporin [neomycin-bacitracin zn-polymyx]   Social History   Tobacco Use   Smoking status: Former Smoker    Packs/day: 0.50    Years: 25.00    Pack years: 12.50    Types: Cigarettes    Last attempt to quit: 12/19/1978    Years since quitting: 40.4   Smokeless tobacco: Never Used  Substance Use Topics   Alcohol use: Yes    Alcohol/week: 4.0 standard drinks    Types: 4 Glasses of wine per week   Drug use: No  Family Hx: The patient's family history includes Heart disease in her mother; Lung cancer in her sister; Prostate cancer in her father.  ROS:   Please see the history of present illness.     All other systems reviewed and are negative.   Prior CV studies:   The following studies were reviewed today:  Echo 12/26/2017 LV EF: 60% -   65%  Study Conclusions  - Left ventricle: The cavity size was normal. Systolic function was   normal. The estimated ejection fraction was in the range of 60%   to 65%. Wall motion was normal; there were no regional wall   motion abnormalities. Left ventricular diastolic function   parameters were normal. - Aortic valve: There was mild regurgitation. - Atrial septum: No defect or patent foramen ovale was identified.  Labs/Other Tests and Data Reviewed:    EKG:  An ECG dated 07/02/2018 was personally reviewed today and demonstrated:  Normal sinus rhythm without significant ST-T wave changes.  Recent Labs: 09/01/2018: Hemoglobin 11.2; Platelets 188 10/03/2018: Potassium 4.3; Sodium 138 01/16/2019: BUN 10; Creatinine 0.75   Recent Lipid Panel Lab Results    Component Value Date/Time   CHOL 158 12/26/2017 12:25 AM   TRIG 167 (H) 12/26/2017 12:25 AM   HDL 51 12/26/2017 12:25 AM   CHOLHDL 3.1 12/26/2017 12:25 AM   LDLCALC 74 12/26/2017 12:25 AM    Wt Readings from Last 3 Encounters:  05/08/19 127 lb (57.6 kg)  01/17/19 126 lb 12.8 oz (57.5 kg)  11/05/18 126 lb (57.2 kg)     Objective:    Vital Signs:  BP 130/75    Pulse 90    Ht 5\' 5"  (1.651 m)    Wt 127 lb (57.6 kg)    BMI 21.13 kg/m    VITAL SIGNS:  reviewed  ASSESSMENT & PLAN:    1. Palpitation: Well-controlled on metoprolol tartrate 12.5 mg twice daily  2. Left pleural effusion: Thought to be related to radiation therapy.  Stable on last repeat CT of the chest in January 2020  3. History of lung cancer status post radiation therapy  4. History of breast cancer status post lumpectomy  COVID-19 Education: The signs and symptoms of COVID-19 were discussed with the patient and how to seek care for testing (follow up with PCP or arrange E-visit).  The importance of social distancing was discussed today.  Time:   Today, I have spent 5 minutes with the patient with telehealth technology discussing the above problems.     Medication Adjustments/Labs and Tests Ordered: Current medicines are reviewed at length with the patient today.  Concerns regarding medicines are outlined above.   Tests Ordered: No orders of the defined types were placed in this encounter.   Medication Changes: No orders of the defined types were placed in this encounter.   Disposition:  Follow up in 6 month(s)  Signed, Almyra Deforest, [place]  05/08/2019 9:33 AM    [place]

## 2019-05-08 NOTE — Telephone Encounter (Signed)

## 2019-05-30 DIAGNOSIS — H26492 Other secondary cataract, left eye: Secondary | ICD-10-CM | POA: Diagnosis not present

## 2019-05-30 DIAGNOSIS — H524 Presbyopia: Secondary | ICD-10-CM | POA: Diagnosis not present

## 2019-07-04 ENCOUNTER — Telehealth: Payer: Self-pay | Admitting: *Deleted

## 2019-07-04 NOTE — Telephone Encounter (Signed)
CALLED PATIENT TO INFORM OF STAT LABS ON 07/16/19 @ 1:15 PM @ [facility] AND HER CT ON 07-16-19 - ARRIVAL TIME- 2:15 PM @ [facility], PATIENT TO HAVE WATER ONLY - 4 HRS. PRIOR TO TEST, PATIENT TO RECEIVE RESULTS ON 07-18-19 @ 1 PM FROM ASHLYN BRUNING, LVM FOR A RETURN CALL

## 2019-07-16 ENCOUNTER — Other Ambulatory Visit: Payer: Self-pay

## 2019-07-16 ENCOUNTER — Telehealth: Payer: Self-pay | Admitting: *Deleted

## 2019-07-16 ENCOUNTER — Ambulatory Visit (HOSPITAL_COMMUNITY)
Admission: RE | Admit: 2019-07-16 | Discharge: 2019-07-16 | Disposition: A | Discharge status: Home / Self Care | Payer: Medicare Other | Source: Ambulatory Visit | Attending: Urology | Admitting: Urology

## 2019-07-16 ENCOUNTER — Ambulatory Visit
Admission: RE | Admit: 2019-07-16 | Discharge: 2019-07-16 | Disposition: A | Discharge status: Home / Self Care | Payer: Medicare Other | Source: Ambulatory Visit | Attending: Urology | Admitting: Urology

## 2019-07-16 ENCOUNTER — Encounter (HOSPITAL_COMMUNITY): Payer: Self-pay

## 2019-07-16 DIAGNOSIS — C3432 Malignant neoplasm of lower lobe, left bronchus or lung: Secondary | ICD-10-CM | POA: Insufficient documentation

## 2019-07-16 DIAGNOSIS — J90 Pleural effusion, not elsewhere classified: Secondary | ICD-10-CM | POA: Diagnosis not present

## 2019-07-16 DIAGNOSIS — C3491 Malignant neoplasm of unspecified part of right bronchus or lung: Secondary | ICD-10-CM | POA: Diagnosis not present

## 2019-07-16 DIAGNOSIS — C3490 Malignant neoplasm of unspecified part of unspecified bronchus or lung: Secondary | ICD-10-CM | POA: Diagnosis not present

## 2019-07-16 LAB — BUN & CREATININE (CHCC)
BUN: 14 mg/dL (ref 8–23)
Creatinine: 0.78 mg/dL (ref 0.44–1.00)
GFR, Est AFR Am: >60 mL/min (ref >60)
GFR, Estimated: >60 mL/min (ref >60)

## 2019-07-16 MED ORDER — IOHEXOL 300 MG/ML  SOLN
75.0000 mL | Freq: Once | INTRAMUSCULAR | Status: AC | PRN
  Administered 2019-07-16: 15:00:00 75 mL via INTRAVENOUS

## 2019-07-16 MED ORDER — SODIUM CHLORIDE (PF) 0.9 % IJ SOLN
INTRAMUSCULAR | Status: AC
  Filled 2019-07-16: qty 50

## 2019-07-16 NOTE — Telephone Encounter (Signed)
Called patient to inform that fu appt. for 07-18-19 has been moved to 11 am , pre provider request, spoke with patient and she is aware of this

## 2019-07-18 ENCOUNTER — Other Ambulatory Visit: Payer: Self-pay

## 2019-07-18 ENCOUNTER — Ambulatory Visit
Admission: RE | Admit: 2019-07-18 | Discharge: 2019-07-18 | Disposition: A | Discharge status: Home / Self Care | Payer: Medicare Other | Source: Ambulatory Visit | Attending: Urology | Admitting: Urology

## 2019-07-18 ENCOUNTER — Encounter: Payer: Self-pay | Admitting: Urology

## 2019-07-18 VITALS — BP 133/74 | HR 100 | Temp 98.7°F | Resp 20 | Ht 65.0 in | Wt 130.2 lb

## 2019-07-18 DIAGNOSIS — I2510 Atherosclerotic heart disease of native coronary artery without angina pectoris: Secondary | ICD-10-CM | POA: Insufficient documentation

## 2019-07-18 DIAGNOSIS — J90 Pleural effusion, not elsewhere classified: Secondary | ICD-10-CM | POA: Diagnosis not present

## 2019-07-18 DIAGNOSIS — Z79899 Other long term (current) drug therapy: Secondary | ICD-10-CM | POA: Diagnosis not present

## 2019-07-18 DIAGNOSIS — D329 Benign neoplasm of meninges, unspecified: Secondary | ICD-10-CM | POA: Diagnosis not present

## 2019-07-18 DIAGNOSIS — C342 Malignant neoplasm of middle lobe, bronchus or lung: Secondary | ICD-10-CM | POA: Diagnosis not present

## 2019-07-18 DIAGNOSIS — R000 Tachycardia, unspecified: Secondary | ICD-10-CM | POA: Insufficient documentation

## 2019-07-18 DIAGNOSIS — C3432 Malignant neoplasm of lower lobe, left bronchus or lung: Secondary | ICD-10-CM | POA: Diagnosis not present

## 2019-07-18 DIAGNOSIS — Z923 Personal history of irradiation: Secondary | ICD-10-CM | POA: Diagnosis not present

## 2019-07-18 DIAGNOSIS — C3491 Malignant neoplasm of unspecified part of right bronchus or lung: Secondary | ICD-10-CM

## 2019-07-18 DIAGNOSIS — Z853 Personal history of malignant neoplasm of breast: Secondary | ICD-10-CM | POA: Diagnosis not present

## 2019-07-18 DIAGNOSIS — D320 Benign neoplasm of cerebral meninges: Secondary | ICD-10-CM | POA: Insufficient documentation

## 2019-07-18 NOTE — Progress Notes (Signed)
Ms [name] French is here for a  Follow-up appointment today.Patient denies any pain  Or fatigue. Patient states that she gets sob sometime.  Vitals:   07/18/19 1055  BP: 133/74  Pulse: 100  Resp: 20  Temp: 98.7 F (37.1 C)  TempSrc: Temporal  SpO2: 92%  Weight: 130 lb 4 oz (59.1 kg)  Height: 5\' 5"  (1.651 m)

## 2019-07-18 NOTE — Progress Notes (Signed)
[name] French  Radiation Oncology         [phone] ________________________________  Name: [name] French MRN: [id number]  Date: 07/18/2019  DOB: 01/25/1926  Post Treatment Note  CC: Tisovec, Fransico Him, MD  Marcy Panning, MD  Diagnosis:   85 y.o. woman with newly diagnosed synchronous NSCLC, adenocarcinoma with a 6 cm LLL mass extending into the left hilum (stage IIB, T3N0M0) and a 2.4 cm RML mass (stage IB T2N0).     Interval Since Last Radiation:  1.5 years 11/28/17 - 01/18/18:  66 Gy directed to the LLL and RML lung lesions in 33 fractions of 2 Gy each.  Narrative:  The patient returns today for routine follow-up and review of recent CT Chest from 07/16/19 which shows overall disease stability without evidence of disease progression or recurrence.  She has not received any systemic treatment thus far, as the feeling was that concurrent chemoradiation would be too toxic for the patient due to her age.  However, molecular studies were ordered to assess for a potential role of immunotherapy if she demonstrated PD1 expression greater than 50%.  Unfortunately, her molecular studies returned showing only a very low expression of PD1 at 5%.  There were no other identifiable mutations noted.  She completed a 6.5 week course of radiotherapy which she tolerated relatively well and remains in observation with Dr. Lindi Adie.        In summary, she has a remote history of breast cancer diagnosed and treated in 2014, an was initially seen in consult on 11/15/18 at the request of Dr. Lindi Adie for newly diagnosed non-small cell lung cancer and incidental finding of meningioma. She presented to her PCP in October 2018 with persistent dry cough that led to an isolated episode of hemoptysis. She denied further episodes of hemoptysis since that time. She presented to her PCP for evaluation and was being worked up for possible pneumonia with a chest x-ray and a CT scan of the chest. CT done on 10/11/2017 showed a 6.0 cm LLL  lung mass with additional lung nodules, including a 2.4 cm pulmonary nodule in the RML as well as 0.3 cm satellite nodules. There was also a hypodense 0.4 cm RLL lesion. A PET CT scan on 112/18 demonstrated a hypermetabolic LLL mass extending into the left hilum with SUV of 26.8 (stage IIb, T3N0M0) and a mildly hypermetabolic RML mass with SUV max of 1.9 (stage IB T2N0).  There were no hypermetabolic hilar or mediastinal nodes.  She was evaluated with Dr. Roxan Hockey and underwent bilateral navigational bronchoscopy with biopsies of the lung masses on 11/01/17 which revealed bilateral non-small cell lung cancer, poorly differentiated adenocarcinoma.   An MRI brain was performed on 10/20/2017 for disease staging and incidentally showed a right inferior parietal convexity meningioma measuring 2.9 x 2.7 x 1.4 cm. There was no evidence of metastatic disease. Her imaging was reviewed at the multidisciplinary brain conference and consensus recommendation was to repeat a scan in 3 months to assess for progression/enlargement. Repeat brain MRI was performed on 01/24/2018 demonstrating a persistent right temporoparietal meningioma unchanged in size with only mild vasogenic edema which appeared improved from her previous study.  There was no evidence of metastatic disease to the brain.  This study was again reviewed at the multidisciplinary brain conference on 01/29/2018 and consensus recommendation was to only repeat brain imaging if the patient develops symptoms.  On review of systems, the patient states that she is doing well overall. Currently, she denies orthopnea, chest pain, dysphagia, fever, chills, cough or hemoptysis. She has baseline shortness of breath with exertion and fatigue which had remained unchanged since 10/2017 but over the past 3-4 months, she has noticed this more with activity.  She denies dyspnea at rest and reports that the shortness of breath with exertion resolves quickly  with rest.  She has continued to tolerate the dose adjusted metoprolol for tachycardia. She denies headaches, changes in visual or auditory acuity, difficulty with speech or word finding, imbalance, tinnitus, tremor or seizure activity.    ALLERGIES:  is allergic to codeine; hydrocodone; and neosporin [neomycin-bacitracin zn-polymyx].  Meds: Current Outpatient Medications  Medication Sig Dispense Refill   Artificial Tear Solution (BION TEARS OP) Place 1-2 drops into both eyes as needed (dry eyes).      aspirin 81 MG tablet Take 81 mg by mouth daily.     cetirizine (ZYRTEC) 10 MG tablet Take 10 mg daily by mouth.      Cholecalciferol (VITAMIN D-3) 1000 UNITS CAPS Take 1,000 Units daily by mouth.      doxylamine, Sleep, (UNISOM) 25 MG tablet Take 25 mg at bedtime by mouth.     Glucosamine-Chondroit-MSM-C-Mn CAPS Take 1 capsule by mouth daily.     Menthol, Topical Analgesic, (BIOFREEZE EX) Apply 1 application topically daily as needed (leg pain).     methocarbamol (ROBAXIN) 500 MG tablet Take 1 tablet (500 mg total) by mouth every 6 (six) hours as needed for muscle spasms. 30 tablet 0   metoprolol tartrate (LOPRESSOR) 25 MG tablet Take 12.5 mg by mouth 2 (two) times daily.     naphazoline-pheniramine (NAPHCON-A) 0.025-0.3 % ophthalmic solution Place 1-2 drops into both eyes as needed for eye irritation or allergies.      nitroGLYCERIN (NITROSTAT) 0.4 MG SL tablet Place 1 tablet (0.4 mg total) under the tongue every 5 (five) minutes as needed for chest pain. 90 tablet 3   pravastatin (PRAVACHOL) 40 MG tablet Take 40 mg daily by mouth.      Probiotic Product (ALIGN) 4 MG CAPS Take 4 mg daily by mouth.     SUPER B COMPLEX & C TABS Take 1 tablet by mouth daily.  0   zolpidem (AMBIEN) 5 MG tablet Take 5 mg by mouth at bedtime as needed (for sleep.). Taking 1/2 tablet po at night  2   Current Facility-Administered Medications  Medication Dose Route Frequency Provider Last Rate Last Dose    0.9 %  sodium chloride infusion  500 mL Intravenous Once Ladene Artist, MD        Physical Findings:  vitals were not taken for this visit.   /10 In general this is a well appearing Caucasian female in no acute distress.  She's alert and oriented x4 and appropriate throughout the examination. Cardiopulmonary assessment is negative for acute distress and she exhibits normal effort.  Lungs are clear to auscultation bilaterally with mild expiratory wheezes bilaterally and decreased breath sounds in the LLL.  There is dullness to percussion in the LLL consistent with known pleural effusion.  EOMs are intact bilaterally and PERRLA.  She appears grossly neurologically intact.  Lab Findings: Lab Results  Component Value Date   WBC 7.7 09/01/2018   HGB 11.2 (L) 09/01/2018   HCT 34.0 (L) 09/01/2018   MCV 88.3 09/01/2018   PLT 188 09/01/2018     Radiographic Findings: Ct Chest W Contrast  Result Date: 07/17/2019 CLINICAL DATA:  Restaging non-small cell lung cancer. EXAM: CT CHEST WITH CONTRAST TECHNIQUE: Multidetector CT imaging of the chest was performed during intravenous contrast administration. CONTRAST:  37mL OMNIPAQUE IOHEXOL 300 MG/ML  SOLN COMPARISON:  01/16/2019 FINDINGS: Cardiovascular: Heart size appears within normal limits. Aortic atherosclerosis. Lad and RCA coronary artery calcifications. No pericardial effusion. Mediastinum/Nodes: Normal appearance of the thyroid gland. The trachea appears patent and is midline. Normal appearance of the esophagus. No enlarged axillary, supraclavicular, mediastinal or hilar lymph nodes. Lungs/Pleura: Moderate loculated left pleural effusion unchanged. Masslike architectural distortion within the perihilar left lung measures 7.2 x 4.2 cm, image 94/2. Previously this measured the same. Interval resolution of previously characterized 3 mm left lung subpleural nodule. Stable 3 mm subpleural nodule in the anterior right middle lobe. Chronic scarring  within the right middle lobe along the minor fissure is unchanged. Upper Abdomen: No acute abnormality. Musculoskeletal: Scoliosis with degenerative disc disease. No suspicious bone lesions. IMPRESSION: 1. No significant interval change compared with previous exam. 2. No change in size of masslike architectural distortion within the perihilar left lung which is likely related to external beam radiation. 3. Stable loculated right pleural effusion. 4. Resolution of previous 3 mm subpleural nodule in the left lung. 5. Aortic Atherosclerosis (ICD10-I70.0). Coronary artery calcifications. Electronically Signed   By: Kerby Moors M.D.   On: 07/17/2019 09:48    Impression/Plan: 1. 85 y.o. woman with synchronous NSCLC, adenocarcinoma with a 6 cm LLL mass extending into the left hilum (stage IIB, T3N0M0) and a 2.4 cm RML mass (stage IB T2N0).  She remains stable both clinically and radiographically.  Her most recent CT Chest from 07/16/19 shows stable appearance of presumed post radiation changes in the inferomedial left hemithorax with persistent collapse/consolidation in the left lower lobe and an unchanged moderate left pleural effusion, stable. We will plan a follow up Chest CT in 6 months to further assess the LLL collapse/consolidation and continue to monitor for any disease progression or recurrence.  She knows to call immediately for any progressive SOB or concerning symptoms in the interval. She is reluctant to pursue thoracentesis for pleural effusion unless felt absolutely necessary since she remains relatively asymptomatic.  We again discussed that should there be any progression in fluid accumulation or progressive shortness of breath with less exertion, we would need to consider moving forward with pulmonary evaluation and/or thoracentesis for therapeutic and diagnostic purposes.  She is in agreement with the stated plan.  2.         Meningioma. This was an incidental finding and patient is currently  without neurologic or systemic complaints.  It does not appear to be causing a mass effect on any structures in the brain and remains stable on MRI brain from 01/24/2018. The consensus at brain conference from 01/29/2018 is to only repeat brain imaging if she becomes symptomatic.  Otherwise we will just follow this expectantly and only repeat further brain imaging should she develop concerning symptoms. She is in agreement with and is comfortable with this plan.  3. Tachycardia. Her heart rate remains stable/improved, at 100, today in the office on low dose Metoprolol.  She did not tolerate the side effects of full dose metoprolol well at all but seems to be tolerating her current low dose fairly well.  This is being managed by her cardiologist, Dr. Peter Martinique.     Nicholos Johns, PA-C

## 2019-08-22 DIAGNOSIS — Z23 Encounter for immunization: Secondary | ICD-10-CM | POA: Diagnosis not present

## 2019-10-11 ENCOUNTER — Telehealth: Payer: Self-pay | Admitting: *Deleted

## 2019-10-11 ENCOUNTER — Telehealth: Payer: Self-pay | Admitting: Radiation Oncology

## 2019-10-11 DIAGNOSIS — R0609 Other forms of dyspnea: Secondary | ICD-10-CM | POA: Diagnosis not present

## 2019-10-11 DIAGNOSIS — J90 Pleural effusion, not elsewhere classified: Secondary | ICD-10-CM | POA: Diagnosis not present

## 2019-10-11 DIAGNOSIS — C3432 Malignant neoplasm of lower lobe, left bronchus or lung: Secondary | ICD-10-CM | POA: Diagnosis not present

## 2019-10-11 NOTE — Telephone Encounter (Signed)
RETURNED PATIENT'S PHONE CALL, LVM FOR A RETURN CALL 

## 2019-10-11 NOTE — Telephone Encounter (Signed)
-----   Message from Freeman Caldron, [place] sent at 10/11/2019  4:10 PM EDT ----- Regarding: FW: PHONE CALL Sam, Will you please call Ms. Life and let her know that I completely agree with her going to see her PCP to check out the "weird feeling" in her lung and she should feel free to check back in with [place] following her evaluation if there are any concerning findings that she wants to discuss further. -Ashlyn ----- Message ----- From: Kerri Perches Sent: 10/11/2019  11:45 AM EDT To: Freeman Caldron, PA-C Subject: PHONE CALL                                     Hi Ashlyn,   This patient has called in and is experiencing a weird feeling in her left lung, she is going to see her GP today.  Ms. Purdy wants to know what you think?  Ms. Cimmino phone number is [phone].  Thanks,  [employer]

## 2019-10-11 NOTE — Telephone Encounter (Signed)
Called patient as requested by [employer], PA-C. Patient reports she was evaluated in her PCP's office today and a chest xray was done. Patient reports her understanding that the fluid in her left lung has increased. Also, patient states the provider believes she needs to be seen by Ashlyn sooner than February. Patient understands this RN will inform Ashlyn of these findings and to expect a call from our office next week.

## 2019-10-14 ENCOUNTER — Telehealth: Payer: Self-pay | Admitting: *Deleted

## 2019-10-14 ENCOUNTER — Encounter: Payer: Self-pay | Admitting: *Deleted

## 2019-10-14 ENCOUNTER — Other Ambulatory Visit: Payer: Self-pay

## 2019-10-14 ENCOUNTER — Inpatient Hospital Stay: Payer: Medicare Other | Attending: Hematology and Oncology | Admitting: Hematology and Oncology

## 2019-10-14 DIAGNOSIS — Z853 Personal history of malignant neoplasm of breast: Secondary | ICD-10-CM | POA: Diagnosis not present

## 2019-10-14 DIAGNOSIS — Z79899 Other long term (current) drug therapy: Secondary | ICD-10-CM | POA: Insufficient documentation

## 2019-10-14 DIAGNOSIS — Z923 Personal history of irradiation: Secondary | ICD-10-CM | POA: Diagnosis not present

## 2019-10-14 DIAGNOSIS — C3491 Malignant neoplasm of unspecified part of right bronchus or lung: Secondary | ICD-10-CM

## 2019-10-14 DIAGNOSIS — C3402 Malignant neoplasm of left main bronchus: Secondary | ICD-10-CM | POA: Insufficient documentation

## 2019-10-14 DIAGNOSIS — Z7982 Long term (current) use of aspirin: Secondary | ICD-10-CM | POA: Insufficient documentation

## 2019-10-14 DIAGNOSIS — Z79811 Long term (current) use of aromatase inhibitors: Secondary | ICD-10-CM | POA: Insufficient documentation

## 2019-10-14 NOTE — Telephone Encounter (Signed)
Called patient to give her info. that Ashlyn left in an in-basket for me, spoke with patient and she verified understanding this

## 2019-10-14 NOTE — Progress Notes (Signed)
Patient Care Team: Tisovec, Fransico Him, MD as PCP - General (Internal Medicine) Martinique, Peter M, MD as PCP - Cardiology (Cardiology) Marcy Panning, MD as Consulting Physician (Oncology)  DIAGNOSIS:  Encounter Diagnosis  Name Primary?  . Non-small cell cancer of right lung ([place])     SUMMARY OF ONCOLOGIC HISTORY: Oncology History  Breast cancer of upper-inner quadrant of right female breast ([place])  05/08/2013 Initial Biopsy   Right: grade 1-2 invasive mammary carcinoma ER positive PR positive HER-2/neu negative with Ki-67 30% (MRI 2.8 cm)second smaller mass together 3.8cm   05/11/2013 - 11/04/2013 Anti-estrogen oral therapy   Letrozole 2.5 mg Neoadjuvant anti-estrogen therapy   11/06/2013 Surgery   Bilateral Lumpectomies: Left: sclerosing lesion with ALH fibrocystic changes with microcalcifications. Right: Grade 1 ILC 2.7 cm with LCIS    Radiation Therapy   Patient declined   11/19/2013 - 06/09/2015 Anti-estrogen oral therapy   Letrozole 2.5 mg (stopped for arthralgias and myalgias and fatigue accompanied with hair loss)   Non-small cell cancer of right lung ([place])  10/20/2017 PET scan   6 cm central left lower lobe pulmonary mass is markedly hypermetabolic with SUV max = 94.0 and extends into the left hilum.  The sub solid 2.4 cm pulmonary nodule in the right middle lobe also shows FDG accumulation with SUV max = 1.9. No evidence for hypermetabolic mediastinal or right hilar lymphadenopathy. L2 uptake degenerative.  LLL:T3N0M0 stage IIb clinical stage; RML: T2N0 Stage 1B   10/20/2017 Imaging   MRI brain: No metastatic disease, right inferior parietal convexity meningioma 2.9 x 2.7 x 1.4 cm.  This indents the brain and associated with mild brain edema   11/01/2017 Initial Diagnosis   Transbronchial needle aspiration right middle lobe and left lower lobe brushings: Both are positive for malignant cells consistent with non-small cell lung cancer    11/29/2017 - 01/18/2018 Radiation  Therapy   Radiation   12/01/2017 Pathology Results   Foundation 1:TPS score: 5%; MS-Stable, TMG High, AKT2 Amp, RB1, TP 53 (no mutations noted in EGFR, K-ras, Al, BRAF, RET, ERBB2, Ros 1)     CHIEF COMPLIANT: Follow-up after recent chest x-ray showed a left basal consolidation and left pleural effusion  INTERVAL HISTORY: [name] French is a 85 year old with above-mentioned history of breast cancer and also non-small cell lung cancer who has been under surveillance with radiation oncology with periodic CT scans who came in today because she had a chest x-ray done for slight left chest discomfort which showed moderate pleural effusion.  She tells me that the discomfort has dissipated and she feels fine.  She does have exertional shortness of breath but no difficulty breathing at rest.  REVIEW OF SYSTEMS:   Constitutional: Denies fevers, chills or abnormal weight loss Eyes: Denies blurriness of vision Ears, nose, mouth, throat, and face: Denies mucositis or sore throat Respiratory: Shortness of breath with exertion Cardiovascular: Denies palpitation, chest discomfort Gastrointestinal:  Denies nausea, heartburn or change in bowel habits Skin: Denies abnormal skin rashes Lymphatics: Denies new lymphadenopathy or easy bruising Neurological:Denies numbness, tingling or new weaknesses Behavioral/Psych: Mood is stable, no new changes  Extremities: No lower extremity edema  All other systems were reviewed with the patient and are negative.  I have reviewed the past medical history, past surgical history, social history and family history with the patient and they are unchanged from previous note.  ALLERGIES:  is allergic to codeine; hydrocodone; and neosporin [neomycin-bacitracin zn-polymyx].  MEDICATIONS:  Current Outpatient Medications  Medication Sig Dispense  Refill  . Artificial Tear Solution (BION TEARS OP) Place 1-2 drops into both eyes as needed (dry eyes).     Marland Kitchen aspirin 81 MG tablet  Take 81 mg by mouth daily.    . cetirizine (ZYRTEC) 10 MG tablet Take 10 mg daily by mouth.     . Cholecalciferol (VITAMIN D-3) 1000 UNITS CAPS Take 1,000 Units daily by mouth.     . doxylamine, Sleep, (UNISOM) 25 MG tablet Take 25 mg at bedtime by mouth.    Marland Kitchen Glucosamine-Chondroit-MSM-C-Mn CAPS Take 1 capsule by mouth daily.    . Menthol, Topical Analgesic, (BIOFREEZE EX) Apply 1 application topically daily as needed (leg pain).    . methocarbamol (ROBAXIN) 500 MG tablet Take 1 tablet (500 mg total) by mouth every 6 (six) hours as needed for muscle spasms. (Patient not taking: Reported on 07/18/2019) 30 tablet 0  . metoprolol tartrate (LOPRESSOR) 25 MG tablet Take 12.5 mg by mouth 2 (two) times daily.    . naphazoline-pheniramine (NAPHCON-A) 0.025-0.3 % ophthalmic solution Place 1-2 drops into both eyes as needed for eye irritation or allergies.     . nitroGLYCERIN (NITROSTAT) 0.4 MG SL tablet Place 1 tablet (0.4 mg total) under the tongue every 5 (five) minutes as needed for chest pain. 90 tablet 3  . pravastatin (PRAVACHOL) 40 MG tablet Take 40 mg daily by mouth.     . Probiotic Product (ALIGN) 4 MG CAPS Take 4 mg daily by mouth.    . SUPER B COMPLEX & C TABS Take 1 tablet by mouth daily.  0  . zolpidem (AMBIEN) 5 MG tablet Take 5 mg by mouth at bedtime as needed (for sleep.). Taking 1/2 tablet po at night  2   Current Facility-Administered Medications  Medication Dose Route Frequency Provider Last Rate Last Dose  . 0.9 %  sodium chloride infusion  500 mL Intravenous Once Ladene Artist, MD        PHYSICAL EXAMINATION: ECOG PERFORMANCE STATUS: 1 - Symptomatic but completely ambulatory  Vitals:   10/14/19 1504  BP: (!) 142/85  Pulse: (!) 110  Resp: 17  Temp: 98.7 F (37.1 C)  SpO2: 95%   Filed Weights   10/14/19 1504  Weight: 130 lb 6.4 oz (59.1 kg)    GENERAL:alert, no distress and comfortable SKIN: skin color, texture, turgor are normal, no rashes or significant lesions  EYES: normal, Conjunctiva are pink and non-injected, sclera clear OROPHARYNX:no exudate, no erythema and lips, buccal mucosa, and tongue normal  NECK: supple, thyroid normal size, non-tender, without nodularity LYMPH:  no palpable lymphadenopathy in the cervical, axillary or inguinal LUNGS: Diminished breath sounds of the left lung base HEART: regular rate & rhythm and no murmurs and no lower extremity edema ABDOMEN:abdomen soft, non-tender and normal bowel sounds MUSCULOSKELETAL:no cyanosis of digits and no clubbing  NEURO: alert & oriented x 3 with fluent speech, no focal motor/sensory deficits EXTREMITIES: No lower extremity edema    LABORATORY DATA:  I have reviewed the data as listed CMP Latest Ref Rng & Units 07/16/2019 01/16/2019 10/03/2018  Glucose 70 - 99 mg/dL - - 92  BUN 8 - 23 mg/dL '14 10 9  '$ Creatinine 0.44 - 1.00 mg/dL 0.78 0.75 0.75  Sodium 135 - 145 mmol/L - - 138  Potassium 3.5 - 5.1 mmol/L - - 4.3  Chloride 98 - 111 mmol/L - - 99  CO2 22 - 32 mmol/L - - 28  Calcium 8.9 - 10.3 mg/dL - - 10.2  Total  Protein 6.5 - 8.1 g/dL - - -  Total Bilirubin 0.3 - 1.2 mg/dL - - -  Alkaline Phos 38 - 126 U/L - - -  AST 15 - 41 U/L - - -  ALT 14 - 54 U/L - - -    Lab Results  Component Value Date   WBC 7.7 09/01/2018   HGB 11.2 (L) 09/01/2018   HCT 34.0 (L) 09/01/2018   MCV 88.3 09/01/2018   PLT 188 09/01/2018   NEUTROABS 5.3 02/06/2018    ASSESSMENT & PLAN:  Non-small cell cancer of right lung (H[facility]) 6 cm central left lower lobe pulmonary mass is markedly hypermetabolic with SUV max = 16.1 and extends into the left hilum. The sub solid 2.4 cm pulmonary nodule in the right middle lobe also shows FDG accumulation with SUV max = 1.9. No evidence for hypermetabolic mediastinal or right hilar lymphadenopathy. L2 uptake degenerative.  Staging:LLL:T3N0M0 stage IIb clinical stage; RML: T2N0 Stage 1B It is also possible that the patient may have stage IV lung cancer based upon  bilateral lung involvement TPS: 5% Foundation One: No mutations that have improved targeted therapies. TMB: High (29 mutations/Mb) microsatellite stable, AKT2 amplification, RB 1, TP 53  Treatment: 1.  Radiation therapy 11/29/2018-01/18/2018 2. current treatment: Observation ----------------------------------------------------------------- Chest x-ray 10/11/2019: Bibasilar consolidation with moderate left pleural effusion  Recommendation: CT chest with contrast in 3 months has been already scheduled. Patient does not need thoracentesis at this time. I reassured her that the chest x-ray is very similar to the CT chest in July. We do not need to repeat any urgent CT chest.  She will have her regularly scheduled CT chest in 3 months and follow-up after that with #11.   No orders of the defined types were placed in this encounter.  The patient has a good understanding of the overall plan. she agrees with it. she will call with any problems that may develop before the next visit here.   Harriette Ohara, MD 10/14/19

## 2019-10-14 NOTE — Assessment & Plan Note (Signed)
6 cm central left lower lobe pulmonary mass is markedly hypermetabolic with SUV max = 14.9 and extends into the left hilum. The sub solid 2.4 cm pulmonary nodule in the right middle lobe also shows FDG accumulation with SUV max = 1.9. No evidence for hypermetabolic mediastinal or right hilar lymphadenopathy. L2 uptake degenerative.  Staging:LLL:T3N0M0 stage IIb clinical stage; RML: T2N0 Stage 1B It is also possible that the patient may have stage IV lung cancer based upon bilateral lung involvement TPS: 5% Foundation One: No mutations that have improved targeted therapies. TMB: High (29 mutations/Mb) microsatellite stable, AKT2 amplification, RB 1, TP 53  Treatment: 1.  Radiation therapy 11/29/2018-01/18/2018 2. current treatment: Observation ----------------------------------------------------------------- Chest x-ray 10/11/2019: Bibasilar consolidation with moderate left pleural effusion  Recommendation: CT chest with contrast. Patient does not want undergo thoracentesis.

## 2019-10-24 ENCOUNTER — Other Ambulatory Visit: Payer: Self-pay | Admitting: Urology

## 2019-10-24 DIAGNOSIS — Z1231 Encounter for screening mammogram for malignant neoplasm of breast: Secondary | ICD-10-CM

## 2019-11-11 DIAGNOSIS — Z20828 Contact with and (suspected) exposure to other viral communicable diseases: Secondary | ICD-10-CM | POA: Diagnosis not present

## 2019-12-17 ENCOUNTER — Other Ambulatory Visit: Payer: Self-pay

## 2019-12-17 ENCOUNTER — Ambulatory Visit
Admission: RE | Admit: 2019-12-17 | Discharge: 2019-12-17 | Disposition: A | Discharge status: Home / Self Care | Payer: Medicare Other | Source: Ambulatory Visit | Attending: Urology | Admitting: Urology

## 2019-12-17 DIAGNOSIS — Z1231 Encounter for screening mammogram for malignant neoplasm of breast: Secondary | ICD-10-CM

## 2019-12-18 ENCOUNTER — Other Ambulatory Visit: Payer: Self-pay | Admitting: Urology

## 2019-12-18 DIAGNOSIS — R928 Other abnormal and inconclusive findings on diagnostic imaging of breast: Secondary | ICD-10-CM

## 2019-12-19 ENCOUNTER — Other Ambulatory Visit: Payer: Self-pay | Admitting: Hematology and Oncology

## 2019-12-19 DIAGNOSIS — R928 Other abnormal and inconclusive findings on diagnostic imaging of breast: Secondary | ICD-10-CM

## 2019-12-23 ENCOUNTER — Other Ambulatory Visit: Payer: Self-pay | Admitting: Hematology and Oncology

## 2019-12-23 ENCOUNTER — Ambulatory Visit
Admission: RE | Admit: 2019-12-23 | Discharge: 2019-12-23 | Disposition: A | Discharge status: Home / Self Care | Payer: Medicare Other | Source: Ambulatory Visit | Attending: Urology | Admitting: Urology

## 2019-12-23 ENCOUNTER — Other Ambulatory Visit: Payer: Self-pay

## 2019-12-23 DIAGNOSIS — N6310 Unspecified lump in the right breast, unspecified quadrant: Secondary | ICD-10-CM

## 2019-12-23 DIAGNOSIS — R928 Other abnormal and inconclusive findings on diagnostic imaging of breast: Secondary | ICD-10-CM

## 2019-12-31 DIAGNOSIS — Z23 Encounter for immunization: Secondary | ICD-10-CM | POA: Diagnosis not present

## 2019-12-31 NOTE — Progress Notes (Signed)
Cardiology Office Note   Date:  12/31/2019   ID:  [name] French, DOB [birth year], MRN [id number]  PCP:  Haywood Pao, MD  Cardiologist:   Norris Bodley Martin[place], MD   No chief complaint on file.     History of Present Illness: [name] French is a 86 y.o. female is seen for follow up  of tachycardia. She has no known history of cardiac disease. She has a history of Lung CA receiving RT. Not felt to be a candidate for chemo or surgery. She was admitted overnight 12/25/17 for evaluation of atypical chest pain. CT negative for PE. There was a left pleural effusion. troponins and Ecg were unremarkable. Echo was normal. She was noted to have PACs and mention of a run of NSVT. She was started on metoprolol. Her chest pain was felt to be esophageal.  She was seen by GI 01/25/18 for evaluation of a abnormal colon finding on PET scan. Evaluation never completed.  It was noted that her HR was elevated to ? 150. She had stopped taking metoprolol 2 days prior. No Ecg recorded.   She was  started  on metoprolol at 12.5 mg bid. Initially seemed to do well without symptoms of tachycardia. Since her last visit multiple phone calls stating metoprolol made her feel very fatigued. "I can't put one foot in front of the other" Attempts at tapering dose resulted in increase in HR. Now taking 12.5 mg daily but by next am HR in the 130s.  She did undergo a Myoview study in April 2019 due to chest pain and it was normal. Subsequent CT scans have shown left lung collapse with a large left effusion- stable.   In September she had a THR and this went very well.   On follow up today she reports she was diagnosed with recurrent breast Cancer on the right. Plans to have CTs done and surgical consultation. Feels well. No tachycardia, chest pain or dyspnea.    Past Medical History:  Diagnosis Date  . Allergic rhinitis   . Anemia    during pregnancy  . Arthritis   . Breast cancer ([place]) 05/08/13   right upper inner,  invasive mammary  . Breast cancer, right breast ([place]) 04/16/2013   Underwent lumpectomy on 11/06/13. Path showed G1 ILC, 2.7 cm, neg margins, receptor+, Her2neg   . Diverticulosis   . 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 Hemorrhoids   . Hx of adenomatous colonic polyps 05/2002  . Lung cancer ([place])   . Lung cancer ([place]) 10/2017  . Meningioma ([place])   . Osteoporosis   . Pneumonia    walking pneumonia  . Rosacea   . 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 Use of letrozole ([place])    neoadjuvant antiestrogen therapy with letrozole 2.5 mg daily x 7 monhts    Past Surgical History:  Procedure Laterality Date  . BREAST BIOPSY Left 1960   lt br bx/benign  . BREAST EXCISIONAL BIOPSY Left   . BREAST LUMPECTOMY Right   . BREAST LUMPECTOMY WITH NEEDLE LOCALIZATION Bilateral 11/06/2013   Procedure: BREAST LUMPECTOMY WITH NEEDLE LOCALIZATION;  Surgeon: Haywood Lasso, MD;  Location: [facility];  Service: General;  Laterality: Bilateral;  . COLONOSCOPY    . EYE SURGERY     both cataracts  . MOHS SURGERY Right    nose basal/squamous  . TOTAL HIP ARTHROPLASTY Left 08/31/2018   Procedure: LEFT TOTAL HIP ARTHROPLASTY ANTERIOR APPROACH;  Surgeon: Mcarthur Rossetti, MD;  Location: [facility];  Service: Orthopedics;  Laterality: Left;  Marland Kitchen VIDEO BRONCHOSCOPY WITH ENDOBRONCHIAL NAVIGATION N/A 11/01/2017   Procedure: VIDEO BRONCHOSCOPY WITH ENDOBRONCHIAL NAVIGATION;  Surgeon: [name], MD;  Location: [facility];  Service: Thoracic;  Laterality: N/A;  . WRIST SURGERY  1990   lt     Current Outpatient Medications  Medication Sig Dispense Refill  . Artificial Tear Solution (BION TEARS OP) Place 1-2 drops into both eyes as needed (dry eyes).     Marland Kitchen aspirin 81 MG tablet Take 81 mg by mouth daily.    . cetirizine (ZYRTEC) 10 MG tablet Take 10 mg daily by mouth.     . Cholecalciferol (VITAMIN D-3) 1000 UNITS CAPS Take 1,000 Units daily by mouth.     . doxylamine, Sleep,  (UNISOM) 25 MG tablet Take 25 mg at bedtime by mouth.    Marland Kitchen Glucosamine-Chondroit-MSM-C-Mn CAPS Take 1 capsule by mouth daily.    . Menthol, Topical Analgesic, (BIOFREEZE EX) Apply 1 application topically daily as needed (leg pain).    . methocarbamol (ROBAXIN) 500 MG tablet Take 1 tablet (500 mg total) by mouth every 6 (six) hours as needed for muscle spasms. (Patient not taking: Reported on 07/18/2019) 30 tablet 0  . metoprolol tartrate (LOPRESSOR) 25 MG tablet Take 12.5 mg by mouth 2 (two) times daily.    . naphazoline-pheniramine (NAPHCON-A) 0.025-0.3 % ophthalmic solution Place 1-2 drops into both eyes as needed for eye irritation or allergies.     . nitroGLYCERIN (NITROSTAT) 0.4 MG SL tablet Place 1 tablet (0.4 mg total) under the tongue every 5 (five) minutes as needed for chest pain. 90 tablet 3  . pravastatin (PRAVACHOL) 40 MG tablet Take 40 mg daily by mouth.     . Probiotic Product (ALIGN) 4 MG CAPS Take 4 mg daily by mouth.    . SUPER B COMPLEX & C TABS Take 1 tablet by mouth daily.  0  . zolpidem (AMBIEN) 5 MG tablet Take 5 mg by mouth at bedtime as needed (for sleep.). Taking 1/2 tablet po at night  2   Current Facility-Administered Medications  Medication Dose Route Frequency Provider Last Rate Last Admin  . 0.9 %  sodium chloride infusion  500 mL Intravenous Once Ladene Artist, MD        Allergies:   Codeine, Hydrocodone, and Neosporin [neomycin-bacitracin zn-polymyx]    Social History:  The patient  reports that she quit smoking about 41 years ago. Her smoking use included cigarettes. She has a 12.50 pack-year smoking history. She has never used smokeless tobacco. She reports current alcohol use of about 4.0 standard drinks of alcohol per week. She reports that she does not use drugs.   Family History:  The patient's family history includes Heart disease in her mother; Lung cancer in her sister; Prostate cancer in her father.    ROS:  Please see the history of present  illness.   Otherwise, review of systems are positive for none.   All other systems are reviewed and negative.    PHYSICAL EXAM: VS:  There were no vitals taken for this visit. , BMI There is no height or weight on file to calculate BMI. GENERAL:  Well appearing EF in NAD HEENT:  PERRL, EOMI, sclera are clear. Oropharynx is clear. NECK:  No jugular venous distention, carotid upstroke brisk and symmetric, no bruits, no thyromegaly or adenopathy LUNGS:  Decreased BS left base.  CHEST:  Unremarkable HEART:  RRR,  PMI not displaced or sustained,S1 and S2 within normal limits, no S3, no S4: no clicks, no rubs, no murmurs ABD:  Soft, nontender. BS +, no masses or bruits. No hepatomegaly, no splenomegaly EXT:  2 + pulses throughout, no edema, no cyanosis no clubbing SKIN:  Warm and dry.  No rashes NEURO:  Alert and oriented x 3. Cranial nerves II through XII intact. PSYCH:  Cognitively intact   EKG:  EKG is   ordered today. NSR rate 84. Normal. I have personally reviewed and interpreted this study.    Recent Labs: 07/16/2019: BUN 14; Creatinine 0.78    Lipid Panel    Component Value Date/Time   CHOL 158 12/26/2017 0025   TRIG 167 (H) 12/26/2017 0025   HDL 51 12/26/2017 0025   CHOLHDL 3.1 12/26/2017 0025   VLDL 33 12/26/2017 0025   LDLCALC 74 12/26/2017 0025    Dated 04/02/18: cholesterol 188, triglycerides 146, HDL 48, LDL 111. CBC and chemistries normal. Dated 04/10/19: cholesterol 171, triglycerides 72, HDL 52, LDL 105. CMET normal  Wt Readings from Last 3 Encounters:  10/14/19 130 lb 6.4 oz (59.1 kg)  07/18/19 130 lb 4 oz (59.1 kg)  05/08/19 127 lb (57.6 kg)      Other studies Reviewed: Additional studies/ records that were reviewed today include:   Echo: 12/26/17:Study Conclusions  - Left ventricle: The cavity size was normal. Systolic function was   normal. The estimated ejection fraction was in the range of 60%   to 65%. Wall motion was normal; there were no regional  wall   motion abnormalities. Left ventricular diastolic function   parameters were normal. - Aortic valve: There was mild regurgitation. - Atrial septum: No defect or patent foramen ovale was identified.   Myoview 03/23/18: Study Highlights    Nuclear stress EF: 56%.  Clinically and electrically negative for ischemia  Normal perfusiion No ischemia or scar  This is a low risk study.     CLINICAL DATA:  Restaging non-small cell lung cancer.  EXAM: CT CHEST WITH CONTRAST  TECHNIQUE: Multidetector CT imaging of the chest was performed during intravenous contrast administration.  CONTRAST:  41mL OMNIPAQUE IOHEXOL 300 MG/ML  SOLN  COMPARISON:  01/16/2019  FINDINGS: Cardiovascular: Heart size appears within normal limits. Aortic atherosclerosis. Lad and RCA coronary artery calcifications. No pericardial effusion.  Mediastinum/Nodes: Normal appearance of the thyroid gland. The trachea appears patent and is midline. Normal appearance of the esophagus. No enlarged axillary, supraclavicular, mediastinal or hilar lymph nodes.  Lungs/Pleura: Moderate loculated left pleural effusion unchanged. Masslike architectural distortion within the perihilar left lung measures 7.2 x 4.2 cm, image 94/2. Previously this measured the same. Interval resolution of previously characterized 3 mm left lung subpleural nodule. Stable 3 mm subpleural nodule in the anterior right middle lobe. Chronic scarring within the right middle lobe along the minor fissure is unchanged.  Upper Abdomen: No acute abnormality.  Musculoskeletal: Scoliosis with degenerative disc disease. No suspicious bone lesions.  IMPRESSION: 1. No significant interval change compared with previous exam. 2. No change in size of masslike architectural distortion within the perihilar left lung which is likely related to external beam radiation. 3. Stable loculated right pleural effusion. 4. Resolution of previous 3  mm subpleural nodule in the left lung. 5. Aortic Atherosclerosis (ICD10-I70.0). Coronary artery calcifications.   Electronically Signed   By: Kerby Moors M.D.   On: 07/17/2019 09:48   ASSESSMENT AND PLAN:  1. Sinus tachycardia. Inappropriate.  HR improved on  metoprolol 12.5 mg bid. Currently asymptomatic. 2. Lung CA s/p RT. Stable by follow up CT in July 2020 with chronic left pleural effusion 3. HTN controlled.  4. Coronary Calcification noted on CT. Myoview study showed normal perfusion.  5. Recurrent breast CA on right. She is cleared for surgery from a cardiac standpoint. If she does go on Herceptin will need to update Echo.    Current medicines are reviewed at length with the patient today.  The patient does not have concerns regarding medicines.   No orders of the defined types were placed in this encounter.    Disposition:   FU 6 months  Signed, Kamdyn Covel Martin[place], MD  12/31/2019 4:36 PM    [facility] [address] [place], [place], [address] Phone [phone], Fax [phone]

## 2020-01-01 ENCOUNTER — Ambulatory Visit
Admission: RE | Admit: 2020-01-01 | Discharge: 2020-01-01 | Disposition: A | Discharge status: Home / Self Care | Payer: Medicare Other | Source: Ambulatory Visit | Attending: Hematology and Oncology | Admitting: Hematology and Oncology

## 2020-01-01 ENCOUNTER — Other Ambulatory Visit: Payer: Self-pay

## 2020-01-01 DIAGNOSIS — R928 Other abnormal and inconclusive findings on diagnostic imaging of breast: Secondary | ICD-10-CM

## 2020-01-01 DIAGNOSIS — C50411 Malignant neoplasm of upper-outer quadrant of right female breast: Secondary | ICD-10-CM | POA: Diagnosis not present

## 2020-01-01 DIAGNOSIS — N6310 Unspecified lump in the right breast, unspecified quadrant: Secondary | ICD-10-CM

## 2020-01-01 DIAGNOSIS — N6311 Unspecified lump in the right breast, upper outer quadrant: Secondary | ICD-10-CM | POA: Diagnosis not present

## 2020-01-03 ENCOUNTER — Telehealth: Payer: Self-pay | Admitting: *Deleted

## 2020-01-03 ENCOUNTER — Telehealth: Payer: Self-pay | Admitting: Hematology

## 2020-01-03 ENCOUNTER — Other Ambulatory Visit: Payer: Self-pay | Admitting: Cardiology

## 2020-01-03 NOTE — Telephone Encounter (Signed)
CALLED PATIENT TO INFORM OF LAB, CT AND FU APPT., LVM FOR A RETURN CALL

## 2020-01-03 NOTE — Telephone Encounter (Signed)
CALLED PATIENT TO INFORM OF STAT LAB FOR 01-17-20 @ 1:45 PM @ [place] AND HER CT @ [facility]- ARRIVAL TIME- 2:45 PM , PATIENT TO HAVE WATER ONLY - 4 HRS. PRIOR TO TEST, PATIENT TO RECEIVE RESULTS ON 01-22-20@ 1 PM VIA TELEPHONE FROM [facility], LINE BUSY , WILL CALL LATER

## 2020-01-03 NOTE — Telephone Encounter (Signed)
Scheduled per 1/15 sch msg. Called and spoke with pt, confirmed 1/18 appt

## 2020-01-05 NOTE — Progress Notes (Signed)
Patient Care Team: Tisovec, Fransico Him, MD as PCP - General (Internal Medicine) Martinique, Peter M, MD as PCP - Cardiology (Cardiology) Marcy Panning, MD as Consulting Physician (Oncology)  DIAGNOSIS:    ICD-10-CM   1. Non-small cell cancer of right lung ([place])  C34.91   2. Malignant neoplasm of upper-inner quadrant of right breast in female, estrogen receptor positive ([place])  C50.211    Z17.0     SUMMARY OF ONCOLOGIC HISTORY: Oncology History  Breast cancer of upper-inner quadrant of right female breast ([place])  05/08/2013 Initial Biopsy   Right: grade 1-2 invasive mammary carcinoma ER positive PR positive HER-2/neu negative with Ki-67 30% (MRI 2.8 cm)second smaller mass together 3.8cm   05/11/2013 - 11/04/2013 Anti-estrogen oral therapy   Letrozole 2.5 mg Neoadjuvant anti-estrogen therapy   11/06/2013 Surgery   Bilateral Lumpectomies: Left: sclerosing lesion with ALH fibrocystic changes with microcalcifications. Right: Grade 1 ILC 2.7 cm with LCIS    Radiation Therapy   Patient declined   11/19/2013 - 06/09/2015 Anti-estrogen oral therapy   Letrozole 2.5 mg (stopped for arthralgias and myalgias and fatigue accompanied with hair loss)   01/01/2020 Relapse/Recurrence   Screening mammogram detected a right breast mass. Diagnostic mammogram showed a 4.2cm mass at the 11 o'clock position in the right breast. Biopsy showed invasive mammary carcinoma, grade 2, HER-2 + (3+), ER+ 95%, PR+ 90%, Ki67 20%.    Non-small cell cancer of right lung ([place])  10/20/2017 PET scan   6 cm central left lower lobe pulmonary mass is markedly hypermetabolic with SUV max = 97.0 and extends into the left hilum.  The sub solid 2.4 cm pulmonary nodule in the right middle lobe also shows FDG accumulation with SUV max = 1.9. No evidence for hypermetabolic mediastinal or right hilar lymphadenopathy. L2 uptake degenerative.  LLL:T3N0M0 stage IIb clinical stage; RML: T2N0 Stage 1B   10/20/2017 Imaging   MRI brain: No  metastatic disease, right inferior parietal convexity meningioma 2.9 x 2.7 x 1.4 cm.  This indents the brain and associated with mild brain edema   11/01/2017 Initial Diagnosis   Transbronchial needle aspiration right middle lobe and left lower lobe brushings: Both are positive for malignant cells consistent with non-small cell lung cancer    11/29/2017 - 01/18/2018 Radiation Therapy   Radiation   12/01/2017 Pathology Results   Foundation 1:TPS score: 5%; MS-Stable, TMG High, AKT2 Amp, RB1, TP 53 (no mutations noted in EGFR, K-ras, Al, BRAF, RET, ERBB2, Ros 1)     CHIEF COMPLIANT: Follow-up of newly recurrent breast cancer to discuss treatment plan  INTERVAL HISTORY: [name] French is a 86 y.o. with above-mentioned history of breast cancer and non-small cell lung cancer who has been under surveillance. Mammogram on 12/17/19 showed a possible mass in the right breast. Diagnostic mammogram on 12/23/19 showed a 4.2cm mass at the 11 o'clock position in the right breast. Biopsy on 01/01/20 showed invasive mammary carcinoma, grade 2, HER-2 positive (3+), ER+ 95%, PR+ 90%, Ki67 20%. She presents to the clinic today to discuss treatment options.   ALLERGIES:  is allergic to codeine; hydrocodone; and neosporin [neomycin-bacitracin zn-polymyx].  MEDICATIONS:  Current Outpatient Medications  Medication Sig Dispense Refill  . Artificial Tear Solution (BION TEARS OP) Place 1-2 drops into both eyes as needed (dry eyes).     Marland Kitchen aspirin 81 MG tablet Take 81 mg by mouth daily.    . cetirizine (ZYRTEC) 10 MG tablet Take 10 mg daily by mouth.     Marland Kitchen  Cholecalciferol (VITAMIN D-3) 1000 UNITS CAPS Take 1,000 Units daily by mouth.     . doxylamine, Sleep, (UNISOM) 25 MG tablet Take 25 mg at bedtime by mouth.    Marland Kitchen Glucosamine-Chondroit-MSM-C-Mn CAPS Take 1 capsule by mouth daily.    . Menthol, Topical Analgesic, (BIOFREEZE EX) Apply 1 application topically daily as needed (leg pain).    . methocarbamol (ROBAXIN)  500 MG tablet Take 1 tablet (500 mg total) by mouth every 6 (six) hours as needed for muscle spasms. (Patient not taking: Reported on 07/18/2019) 30 tablet 0  . metoprolol tartrate (LOPRESSOR) 25 MG tablet TAKE ONE TABLET TWICE DAILY 180 tablet 3  . naphazoline-pheniramine (NAPHCON-A) 0.025-0.3 % ophthalmic solution Place 1-2 drops into both eyes as needed for eye irritation or allergies.     . nitroGLYCERIN (NITROSTAT) 0.4 MG SL tablet Place 1 tablet (0.4 mg total) under the tongue every 5 (five) minutes as needed for chest pain. 90 tablet 3  . pravastatin (PRAVACHOL) 40 MG tablet Take 40 mg daily by mouth.     . Probiotic Product (ALIGN) 4 MG CAPS Take 4 mg daily by mouth.    . SUPER B COMPLEX & C TABS Take 1 tablet by mouth daily.  0  . zolpidem (AMBIEN) 5 MG tablet Take 5 mg by mouth at bedtime as needed (for sleep.). Taking 1/2 tablet po at night  2   Current Facility-Administered Medications  Medication Dose Route Frequency Provider Last Rate Last Admin  . 0.9 %  sodium chloride infusion  500 mL Intravenous Once Ladene Artist, MD        PHYSICAL EXAMINATION: ECOG PERFORMANCE STATUS: 1 - Symptomatic but completely ambulatory  Vitals:   01/06/20 1417  BP: (!) 141/65  Pulse: (!) 115  Resp: 18  Temp: 98.5 F (36.9 C)  SpO2: 99%   Filed Weights   01/06/20 1417  Weight: 129 lb 9.6 oz (58.8 kg)    LABORATORY DATA:  I have reviewed the data as listed CMP Latest Ref Rng & Units 07/16/2019 01/16/2019 10/03/2018  Glucose 70 - 99 mg/dL - - 92  BUN 8 - 23 mg/dL '14 10 9  '$ Creatinine 0.44 - 1.00 mg/dL 0.78 0.75 0.75  Sodium 135 - 145 mmol/L - - 138  Potassium 3.5 - 5.1 mmol/L - - 4.3  Chloride 98 - 111 mmol/L - - 99  CO2 22 - 32 mmol/L - - 28  Calcium 8.9 - 10.3 mg/dL - - 10.2  Total Protein 6.5 - 8.1 g/dL - - -  Total Bilirubin 0.3 - 1.2 mg/dL - - -  Alkaline Phos 38 - 126 U/L - - -  AST 15 - 41 U/L - - -  ALT 14 - 54 U/L - - -    Lab Results  Component Value Date   WBC 7.7  09/01/2018   HGB 11.2 (L) 09/01/2018   HCT 34.0 (L) 09/01/2018   MCV 88.3 09/01/2018   PLT 188 09/01/2018   NEUTROABS 5.3 02/06/2018    ASSESSMENT & PLAN:  Non-small cell cancer of right lung (HCC) 6 cm central left lower lobe pulmonary mass is markedly hypermetabolic with SUV max = 53.7 and extends into the left hilum. The sub solid 2.4 cm pulmonary nodule in the right middle lobe also shows FDG accumulation with SUV max = 1.9. No evidence for hypermetabolic mediastinal or right hilar lymphadenopathy. L2 uptake degenerative.  Staging:LLL:T3N0M0 stage IIb clinical stage; RML: T2N0 Stage 1B It is also possible that the  patient may have stage IV lung cancer based upon bilateral lung involvement TPS: 5% Foundation One: No mutations that have improved targeted therapies. TMB: High (29 mutations/Mb) microsatellite stable, AKT2 amplification, RB 1, TP 53  Treatment: 1.Radiation therapy 11/29/2018-01/18/2018 2.current treatment: Observation ----------------------------------------------------------------- Chest x-ray 10/11/2019: Bibasilar consolidation with moderate left pleural effusion CT chest 01/17/2020:  Breast cancer of upper-inner quadrant of right female breast 12/23/2019: Suspicious 4.2 cm mass in the right breast, no abnormal right axillary lymph nodes 01/03/2020: Right breast biopsy 11 o'clock position: Grade 2 invasive lobular carcinoma with LCIS ER 95%, PR 90%, Ki-67 20%, HER-2 3+ positive  Pathology review: I discussed with the patient the results of the recent biopsy showing recurrent breast cancer that is still ER/PR positive but HER-2 is also positive which is different than previous.  Recommendation:  1.  Patient has appointment to see Dr. Ninfa Linden to discuss mastectomy versus lumpectomy  2.  She is not interested in adjuvant radiation.   3.  Adjuvant treatment plan: Anastrozole with Herceptin subcutaneous  We will obtain CTs scans for staging. I would like to do a  telephone visit after the CT scans to go over the results. I will see her in person after the surgery to talk about adjuvant treatment.     No orders of the defined types were placed in this encounter.  The patient has a good understanding of the overall plan. she agrees with it. she will call with any problems that may develop before the next visit here.  Total time spent: 30 mins including face to face time and time spent for planning, charting and coordination of care  Nicholas Lose, MD 01/06/2020  I, Cloyde Reams Dorshimer, am acting as scribe for Dr. Nicholas Lose.  I have reviewed the above documentation for accuracy and completeness, and I agree with the above.

## 2020-01-06 ENCOUNTER — Inpatient Hospital Stay: Payer: Medicare Other | Attending: Hematology and Oncology | Admitting: Hematology and Oncology

## 2020-01-06 ENCOUNTER — Telehealth: Payer: Self-pay | Admitting: *Deleted

## 2020-01-06 ENCOUNTER — Other Ambulatory Visit: Payer: Self-pay | Admitting: Hematology and Oncology

## 2020-01-06 ENCOUNTER — Other Ambulatory Visit: Payer: Self-pay

## 2020-01-06 DIAGNOSIS — Z85118 Personal history of other malignant neoplasm of bronchus and lung: Secondary | ICD-10-CM | POA: Insufficient documentation

## 2020-01-06 DIAGNOSIS — Z7982 Long term (current) use of aspirin: Secondary | ICD-10-CM | POA: Insufficient documentation

## 2020-01-06 DIAGNOSIS — Z170 Estrogen receptor positive status [ER+]: Secondary | ICD-10-CM

## 2020-01-06 DIAGNOSIS — Z79899 Other long term (current) drug therapy: Secondary | ICD-10-CM | POA: Diagnosis not present

## 2020-01-06 DIAGNOSIS — Z79811 Long term (current) use of aromatase inhibitors: Secondary | ICD-10-CM | POA: Diagnosis not present

## 2020-01-06 DIAGNOSIS — C50211 Malignant neoplasm of upper-inner quadrant of right female breast: Secondary | ICD-10-CM | POA: Insufficient documentation

## 2020-01-06 DIAGNOSIS — Z923 Personal history of irradiation: Secondary | ICD-10-CM | POA: Insufficient documentation

## 2020-01-06 DIAGNOSIS — C3491 Malignant neoplasm of unspecified part of right bronchus or lung: Secondary | ICD-10-CM

## 2020-01-06 DIAGNOSIS — J90 Pleural effusion, not elsewhere classified: Secondary | ICD-10-CM | POA: Diagnosis not present

## 2020-01-06 NOTE — Assessment & Plan Note (Signed)
6 cm central left lower lobe pulmonary mass is markedly hypermetabolic with SUV max = 68.8 and extends into the left hilum. The sub solid 2.4 cm pulmonary nodule in the right middle lobe also shows FDG accumulation with SUV max = 1.9. No evidence for hypermetabolic mediastinal or right hilar lymphadenopathy. L2 uptake degenerative.  Staging:LLL:T3N0M0 stage IIb clinical stage; RML: T2N0 Stage 1B It is also possible that the patient may have stage IV lung cancer based upon bilateral lung involvement TPS: 5% Foundation One: No mutations that have improved targeted therapies. TMB: High (29 mutations/Mb) microsatellite stable, AKT2 amplification, RB 1, TP 53  Treatment: 1.Radiation therapy 11/29/2018-01/18/2018 2.current treatment: Observation ----------------------------------------------------------------- Chest x-ray 10/11/2019: Bibasilar consolidation with moderate left pleural effusion CT chest 01/17/2020:

## 2020-01-06 NOTE — Assessment & Plan Note (Addendum)
12/23/2019: Suspicious 4.2 cm mass in the right breast, no abnormal right axillary lymph nodes 01/03/2020: Right breast biopsy 11 o'clock position: Grade 2 invasive lobular carcinoma with LCIS ER 95%, PR 90%, Ki-67 20%, HER-2 3+ positive  Pathology review: I discussed with the patient the results of the recent biopsy showing recurrent breast cancer that is still ER/PR positive but HER-2 is also positive which is different than previous.  Recommendation: Anastrozole with Herceptin

## 2020-01-06 NOTE — Telephone Encounter (Signed)
Attempt x1 to contact pt, no answer, LVM regarding upcoming CT of chest, abdomen, pelvis on 01/17/2020 at 3pm.  Advised pt nothing to eat or drink after 11 am the day of and to drink the first bottle of oral contrast at 1 p.m and the second bottle of oral contrast at 2 p.m.

## 2020-01-07 ENCOUNTER — Ambulatory Visit (INDEPENDENT_AMBULATORY_CARE_PROVIDER_SITE_OTHER): Payer: Medicare Other | Admitting: Cardiology

## 2020-01-07 ENCOUNTER — Encounter: Payer: Self-pay | Admitting: Cardiology

## 2020-01-07 ENCOUNTER — Telehealth: Payer: Self-pay | Admitting: Hematology and Oncology

## 2020-01-07 VITALS — BP 119/71 | HR 78 | Temp 97.9°F | Ht 65.0 in | Wt 130.0 lb

## 2020-01-07 DIAGNOSIS — R000 Tachycardia, unspecified: Secondary | ICD-10-CM

## 2020-01-07 DIAGNOSIS — C3490 Malignant neoplasm of unspecified part of unspecified bronchus or lung: Secondary | ICD-10-CM | POA: Diagnosis not present

## 2020-01-07 DIAGNOSIS — C50919 Malignant neoplasm of unspecified site of unspecified female breast: Secondary | ICD-10-CM

## 2020-01-07 NOTE — Telephone Encounter (Signed)
I talk with patient regarding phone visit  °

## 2020-01-09 ENCOUNTER — Encounter: Payer: Self-pay | Admitting: *Deleted

## 2020-01-10 ENCOUNTER — Other Ambulatory Visit: Payer: Self-pay | Admitting: Surgery

## 2020-01-10 DIAGNOSIS — C50911 Malignant neoplasm of unspecified site of right female breast: Secondary | ICD-10-CM | POA: Diagnosis not present

## 2020-01-13 ENCOUNTER — Encounter: Payer: Self-pay | Admitting: Orthopaedic Surgery

## 2020-01-13 ENCOUNTER — Other Ambulatory Visit: Payer: Self-pay

## 2020-01-13 ENCOUNTER — Ambulatory Visit (INDEPENDENT_AMBULATORY_CARE_PROVIDER_SITE_OTHER): Payer: Medicare Other

## 2020-01-13 ENCOUNTER — Ambulatory Visit (INDEPENDENT_AMBULATORY_CARE_PROVIDER_SITE_OTHER): Payer: Medicare Other | Admitting: Orthopaedic Surgery

## 2020-01-13 DIAGNOSIS — Z96642 Presence of left artificial hip joint: Secondary | ICD-10-CM | POA: Diagnosis not present

## 2020-01-13 NOTE — Progress Notes (Signed)
The patient is a very pleasant and active 86 year old female who is about 16 months out from a left total hip arthroplasty.  She says the hip is doing well and she has no problems at all.  She does perform water aerobics and walks.  She denies any right hip pain at all.  On examination both hips move smoothly and fluidly without any issues at all.  Her leg lengths are equal.  An AP pelvis and lateral of the left hip shows a well-seated total hip arthroplasty with no complicating features.  At this point follow-up can be as needed since she is doing well.  If there is any issues at all with her hip she will let us know.  All question concerns were answered and addressed.

## 2020-01-17 ENCOUNTER — Other Ambulatory Visit: Payer: Self-pay

## 2020-01-17 ENCOUNTER — Ambulatory Visit (HOSPITAL_COMMUNITY)
Admission: RE | Admit: 2020-01-17 | Discharge: 2020-01-17 | Disposition: A | Discharge status: Home / Self Care | Payer: Medicare Other | Source: Ambulatory Visit | Attending: Urology | Admitting: Urology

## 2020-01-17 ENCOUNTER — Ambulatory Visit
Admission: RE | Admit: 2020-01-17 | Discharge: 2020-01-17 | Disposition: A | Discharge status: Home / Self Care | Payer: Medicare Other | Source: Ambulatory Visit | Attending: Urology | Admitting: Urology

## 2020-01-17 DIAGNOSIS — C3432 Malignant neoplasm of lower lobe, left bronchus or lung: Secondary | ICD-10-CM | POA: Insufficient documentation

## 2020-01-17 DIAGNOSIS — C50211 Malignant neoplasm of upper-inner quadrant of right female breast: Secondary | ICD-10-CM | POA: Insufficient documentation

## 2020-01-17 DIAGNOSIS — Z170 Estrogen receptor positive status [ER+]: Secondary | ICD-10-CM | POA: Insufficient documentation

## 2020-01-17 DIAGNOSIS — J90 Pleural effusion, not elsewhere classified: Secondary | ICD-10-CM | POA: Diagnosis not present

## 2020-01-17 DIAGNOSIS — C3490 Malignant neoplasm of unspecified part of unspecified bronchus or lung: Secondary | ICD-10-CM | POA: Diagnosis not present

## 2020-01-17 DIAGNOSIS — C3491 Malignant neoplasm of unspecified part of right bronchus or lung: Secondary | ICD-10-CM | POA: Insufficient documentation

## 2020-01-17 DIAGNOSIS — K8020 Calculus of gallbladder without cholecystitis without obstruction: Secondary | ICD-10-CM | POA: Diagnosis not present

## 2020-01-17 LAB — BUN & CREATININE (CHCC)
BUN: 11 mg/dL (ref 8–23)
Creatinine: 0.74 mg/dL (ref 0.44–1.00)
GFR, Est AFR Am: >60 mL/min (ref >60)
GFR, Estimated: >60 mL/min (ref >60)

## 2020-01-17 MED ORDER — SODIUM CHLORIDE (PF) 0.9 % IJ SOLN
INTRAMUSCULAR | Status: AC
  Filled 2020-01-17: qty 50

## 2020-01-17 MED ORDER — IOHEXOL 300 MG/ML  SOLN
100.0000 mL | Freq: Once | INTRAMUSCULAR | Status: AC | PRN
  Administered 2020-01-17: 80 mL via INTRAVENOUS

## 2020-01-19 NOTE — Progress Notes (Signed)
HEMATOLOGY-ONCOLOGY TELEPHONE VISIT PROGRESS NOTE  I connected with [name] French on 01/20/2020 at  2:15 PM EST by telephone and verified that I am speaking with the correct person using two identifiers.  I discussed the limitations, risks, security and privacy concerns of performing an evaluation and management service by telephone and the availability of in person appointments.  I also discussed with the patient that there may be a patient responsible charge related to this service. The patient expressed understanding and agreed to proceed.   History of Present Illness: [name] French is a 86 y.o. female with above-mentioned history of recurrent right breast cancer.  CT CAP on 01/16/19 showed a left pleural effusion, no new or suspicious pulmonary nodules, stable small mediastinal lymph nodes, and no signs of metastatic disease. She presents over the phone today to review her scans.   Oncology History  Breast cancer of upper-inner quadrant of right female breast ([place])  05/08/2013 Initial Biopsy   Right: grade 1-2 invasive mammary carcinoma ER positive PR positive HER-2/neu negative with Ki-67 30% (MRI 2.8 cm)second smaller mass together 3.8cm   05/11/2013 - 11/04/2013 Anti-estrogen oral therapy   Letrozole 2.5 mg Neoadjuvant anti-estrogen therapy   11/06/2013 Surgery   Bilateral Lumpectomies: Left: sclerosing lesion with ALH fibrocystic changes with microcalcifications. Right: Grade 1 ILC 2.7 cm with LCIS    Radiation Therapy   Patient declined   11/19/2013 - 06/09/2015 Anti-estrogen oral therapy   Letrozole 2.5 mg (stopped for arthralgias and myalgias and fatigue accompanied with hair loss)   01/01/2020 Relapse/Recurrence   Screening mammogram detected a right breast mass. Diagnostic mammogram showed a 4.2cm mass at the 11 o'clock position in the right breast. Biopsy showed invasive mammary carcinoma, grade 2, HER-2 + (3+), ER+ 95%, PR+ 90%, Ki67 20%.    Non-small cell cancer of right  lung ([place])  10/20/2017 PET scan   6 cm central left lower lobe pulmonary mass is markedly hypermetabolic with SUV max = 69.6 and extends into the left hilum.  The sub solid 2.4 cm pulmonary nodule in the right middle lobe also shows FDG accumulation with SUV max = 1.9. No evidence for hypermetabolic mediastinal or right hilar lymphadenopathy. L2 uptake degenerative.  LLL:T3N0M0 stage IIb clinical stage; RML: T2N0 Stage 1B   10/20/2017 Imaging   MRI brain: No metastatic disease, right inferior parietal convexity meningioma 2.9 x 2.7 x 1.4 cm.  This indents the brain and associated with mild brain edema   11/01/2017 Initial Diagnosis   Transbronchial needle aspiration right middle lobe and left lower lobe brushings: Both are positive for malignant cells consistent with non-small cell lung cancer    11/29/2017 - 01/18/2018 Radiation Therapy   Radiation   12/01/2017 Pathology Results   Foundation 1:TPS score: 5%; MS-Stable, TMG High, AKT2 Amp, RB1, TP 53 (no mutations noted in EGFR, K-ras, Al, BRAF, RET, ERBB2, Ros 1)     Observations/Objective:     Assessment Plan:  Breast cancer of upper-inner quadrant of right female breast 12/23/2019: Suspicious 4.2 cm mass in the right breast, no abnormal right axillary lymph nodes 01/03/2020: Right breast biopsy 11 o'clock position: Grade 2 invasive lobular carcinoma with LCIS ER 95%, PR 90%, Ki-67 20%, HER-2 3+ positive  CT CAP 01/17/20: Masslike consolidation following radiation measuring 6.8 x 3.6 cm as compared to 7.2 by 4.2 cm. Pleural thickening approximately 6 mm . No metastatic disease.  Recommendation:  1.  Patient has appointment to see Dr. Ninfa Linden to discuss mastectomy versus lumpectomy  2.  She is not interested in adjuvant radiation.   3.  Adjuvant treatment plan: Anastrozole with Herceptin subcutaneous  RTC after surgery  Non-small cell cancer of right lung ([facility]) 6 cm central left lower lobe pulmonary mass is markedly hypermetabolic  with SUV max = 75.6 and extends into the left hilum. The sub solid 2.4 cm pulmonary nodule in the right middle lobe also shows FDG accumulation with SUV max = 1.9. No evidence for hypermetabolic mediastinal or right hilar lymphadenopathy. L2 uptake degenerative.  Staging:LLL:T3N0M0 stage IIb clinical stage; RML: T2N0 Stage 1B It is also possible that the patient may have stage IV lung cancer based upon bilateral lung involvement TPS: 5% Foundation One: No mutations that have improved targeted therapies. TMB: High (29 mutations/Mb) microsatellite stable, AKT2 amplification, RB 1, TP 53  Treatment: 1.Radiation therapy 11/29/2018-01/18/2018 2.current treatment: Observation ----------------------------------------------------------------- Chest x-ray 10/11/2019: Bibasilar consolidation with moderate left pleural effusion CT chest 01/17/2020:CT CAP 01/17/20: Masslike consolidation following radiation measuring 6.8 x 3.6 cm as compared to 7.2 by 4.2 cm. Pleural thickening approximately 6 mm . No metastatic disease.  Recheck with CT chest in 3 months    I discussed the assessment and treatment plan with the patient. The patient was provided an opportunity to ask questions and all were answered. The patient agreed with the plan and demonstrated an understanding of the instructions. The patient was advised to call back or seek an in-person evaluation if the symptoms worsen or if the condition fails to improve as anticipated.   I provided 20 minutes of non-face-to-face time during this encounter.   Rulon Eisenmenger, MD 01/20/2020    I, Molly Dorshimer, am acting as scribe for Nicholas Lose, MD.  I have reviewed the above documentation for accuracy and completeness, and I agree with the above.

## 2020-01-20 ENCOUNTER — Telehealth: Payer: Self-pay | Admitting: *Deleted

## 2020-01-20 ENCOUNTER — Inpatient Hospital Stay: Payer: Medicare Other | Attending: Hematology and Oncology | Admitting: Hematology and Oncology

## 2020-01-20 DIAGNOSIS — Z791 Long term (current) use of non-steroidal anti-inflammatories (NSAID): Secondary | ICD-10-CM | POA: Insufficient documentation

## 2020-01-20 DIAGNOSIS — C3490 Malignant neoplasm of unspecified part of unspecified bronchus or lung: Secondary | ICD-10-CM | POA: Diagnosis not present

## 2020-01-20 DIAGNOSIS — Z853 Personal history of malignant neoplasm of breast: Secondary | ICD-10-CM | POA: Insufficient documentation

## 2020-01-20 DIAGNOSIS — C3491 Malignant neoplasm of unspecified part of right bronchus or lung: Secondary | ICD-10-CM | POA: Diagnosis not present

## 2020-01-20 DIAGNOSIS — Z170 Estrogen receptor positive status [ER+]: Secondary | ICD-10-CM | POA: Insufficient documentation

## 2020-01-20 DIAGNOSIS — C50211 Malignant neoplasm of upper-inner quadrant of right female breast: Secondary | ICD-10-CM | POA: Diagnosis not present

## 2020-01-20 DIAGNOSIS — C3432 Malignant neoplasm of lower lobe, left bronchus or lung: Secondary | ICD-10-CM | POA: Insufficient documentation

## 2020-01-20 DIAGNOSIS — Z79899 Other long term (current) drug therapy: Secondary | ICD-10-CM | POA: Insufficient documentation

## 2020-01-20 DIAGNOSIS — Z923 Personal history of irradiation: Secondary | ICD-10-CM | POA: Insufficient documentation

## 2020-01-20 DIAGNOSIS — Z7982 Long term (current) use of aspirin: Secondary | ICD-10-CM | POA: Insufficient documentation

## 2020-01-20 NOTE — Assessment & Plan Note (Addendum)
6 cm central left lower lobe pulmonary mass is markedly hypermetabolic with SUV max = 58.3 and extends into the left hilum. The sub solid 2.4 cm pulmonary nodule in the right middle lobe also shows FDG accumulation with SUV max = 1.9. No evidence for hypermetabolic mediastinal or right hilar lymphadenopathy. L2 uptake degenerative.  Staging:LLL:T3N0M0 stage IIb clinical stage; RML: T2N0 Stage 1B It is also possible that the patient may have stage IV lung cancer based upon bilateral lung involvement TPS: 5% Foundation One: No mutations that have improved targeted therapies. TMB: High (29 mutations/Mb) microsatellite stable, AKT2 amplification, RB 1, TP 53  Treatment: 1.Radiation therapy 11/29/2018-01/18/2018 2.current treatment: Observation ----------------------------------------------------------------- Chest x-ray 10/11/2019: Bibasilar consolidation with moderate left pleural effusion CT chest 01/17/2020:CT CAP 01/17/20: Masslike consolidation following radiation measuring 6.8 x 3.6 cm as compared to 7.2 by 4.2 cm. Pleural thickening approximately 6 mm . No metastatic disease.  Recheck with CT chest in 3 months

## 2020-01-20 NOTE — Assessment & Plan Note (Signed)
12/23/2019: Suspicious 4.2 cm mass in the right breast, no abnormal right axillary lymph nodes 01/03/2020: Right breast biopsy 11 o'clock position: Grade 2 invasive lobular carcinoma with LCIS ER 95%, PR 90%, Ki-67 20%, HER-2 3+ positive  CT CAP 01/17/20: Masslike consolidation following radiation measuring 6.8 x 3.6 cm as compared to 7.2 by 4.2 cm. Pleural thickening approximately 6 mm . No metastatic disease.  Recommendation:  1.  Patient has appointment to see Dr. Ninfa Linden to discuss mastectomy versus lumpectomy  2.  She is not interested in adjuvant radiation.   3.  Adjuvant treatment plan: Anastrozole with Herceptin subcutaneous  RTC after surgery

## 2020-01-20 NOTE — Telephone Encounter (Signed)
RETURNED PATIENT'S PHONE CALL, SPOKE WITH PATIENT. ?

## 2020-01-21 ENCOUNTER — Telehealth: Payer: Self-pay | Admitting: Hematology and Oncology

## 2020-01-21 NOTE — Telephone Encounter (Signed)
Patient stated that surgery was 2/18 so scheduled follow up after that

## 2020-01-22 ENCOUNTER — Ambulatory Visit
Admission: RE | Admit: 2020-01-22 | Discharge: 2020-01-22 | Disposition: A | Discharge status: Home / Self Care | Payer: Medicare Other | Source: Ambulatory Visit | Attending: Urology | Admitting: Urology

## 2020-01-22 ENCOUNTER — Encounter: Payer: Self-pay | Admitting: Urology

## 2020-01-22 ENCOUNTER — Other Ambulatory Visit: Payer: Self-pay

## 2020-01-22 DIAGNOSIS — Z08 Encounter for follow-up examination after completed treatment for malignant neoplasm: Secondary | ICD-10-CM | POA: Diagnosis not present

## 2020-01-22 DIAGNOSIS — C342 Malignant neoplasm of middle lobe, bronchus or lung: Secondary | ICD-10-CM | POA: Diagnosis not present

## 2020-01-22 DIAGNOSIS — C50911 Malignant neoplasm of unspecified site of right female breast: Secondary | ICD-10-CM | POA: Diagnosis not present

## 2020-01-22 DIAGNOSIS — C3491 Malignant neoplasm of unspecified part of right bronchus or lung: Secondary | ICD-10-CM

## 2020-01-22 DIAGNOSIS — D329 Benign neoplasm of meninges, unspecified: Secondary | ICD-10-CM | POA: Diagnosis not present

## 2020-01-22 DIAGNOSIS — Z170 Estrogen receptor positive status [ER+]: Secondary | ICD-10-CM | POA: Diagnosis not present

## 2020-01-22 DIAGNOSIS — C3432 Malignant neoplasm of lower lobe, left bronchus or lung: Secondary | ICD-10-CM

## 2020-01-22 NOTE — Progress Notes (Signed)
[name]  Radiation Oncology         [phone] ________________________________  Name: [name] French MRN: [id number]  Date: 01/22/2020  DOB: 08-29-1926  Post Treatment Note  CC: Tisovec, Fransico Him, MD  Marcy Panning, MD  Diagnosis:   86 y.o. woman with newly diagnosed synchronous NSCLC, adenocarcinoma with a 6 cm LLL mass extending into the left hilum (stage IIB, T3N0M0) and a 2.4 cm RML mass (stage IB T2N0).     Interval Since Last Radiation:  2 years 11/28/17 - 01/18/18:  66 Gy directed to the LLL and RML lung lesions in 33 fractions of 2 Gy each.  Narrative:  I spoke with the patient to conduct her routine scheduled 6 month follow up visit to review her recent follow-up CT chest results via telephone to spare the patient unnecessary potential exposure in the healthcare setting during the current COVID-19 pandemic.  The patient was notified in advance and gave permission to proceed with this visit format. Her most recent CT Chest from 01/17/20 shows overall disease stability without evidence of disease progression or recurrence.  She has not received any systemic treatment thus far, as the feeling was that concurrent chemoradiation would be too toxic for the patient due to her age.  However, molecular studies were ordered to assess for a potential role of immunotherapy if she demonstrated PD1 expression greater than 50%.  Unfortunately, her molecular studies returned showing only a very low expression of PD1 at 5%.  There were no other identifiable mutations noted.  She completed a 6.5 week course of radiotherapy which she tolerated relatively well and has remained in observation with Dr. Lindi Adie.   Unfortunately, she has recently been found to have a recurrence of her right breast cancer with a 4.2 cm mass in the right breast detected on diagnostic mammography on December 23, 2019 with confirmation of a grade 2 invasive lobular carcinoma with LCIS, ER/PR positive, and HER-2 neu positive via right  breast biopsy performed on 01/03/2020.  Fortunately, there was no evidence of distant metastatic disease on CT C/A/P 01/17/2020.  She is scheduled for a lumpectomy with Dr. Ninfa Linden on 02/06/20 but is adamantly not interested in adjuvant radiotherapy.  In summary, she has a remote history of breast cancer diagnosed and treated in 2014, an was initially seen in consult on 11/15/18 at the request of Dr. Lindi Adie for newly diagnosed non-small cell lung cancer and incidental finding of meningioma. She presented to her PCP in October 2018 with persistent dry cough that led to an isolated episode of hemoptysis. She denied further episodes of hemoptysis since that time. She presented to her PCP for evaluation and was being worked up for possible pneumonia with a chest x-ray and a CT scan of the chest. CT done on 10/11/2017 showed a 6.0 cm LLL lung mass with additional lung nodules, including a 2.4 cm pulmonary nodule in the RML as well as 0.3 cm satellite nodules. There was also a hypodense 0.4 cm RLL lesion. A PET CT scan on 112/18 demonstrated a hypermetabolic LLL mass extending into the left hilum with SUV of 26.8 (stage IIb, T3N0M0) and a mildly hypermetabolic RML mass with SUV max of 1.9 (stage IB T2N0).  There were no hypermetabolic hilar or mediastinal nodes.  She was evaluated with Dr. Roxan Hockey and underwent bilateral navigational bronchoscopy with biopsies of the lung masses on 11/01/17 which revealed bilateral non-small cell lung cancer, poorly differentiated adenocarcinoma.   An MRI brain was performed on 10/20/2017 for  disease staging and incidentally showed a right inferior parietal convexity meningioma measuring 2.9 x 2.7 x 1.4 cm. There was no evidence of metastatic disease. Her imaging was reviewed at the multidisciplinary brain conference and consensus recommendation was to repeat a scan in 3 months to assess for progression/enlargement. Repeat brain MRI was performed on 01/24/2018 demonstrating a  persistent right temporoparietal meningioma unchanged in size with only mild vasogenic edema which appeared improved from her previous study.  There was no evidence of metastatic disease to the brain.  This study was again reviewed at the multidisciplinary brain conference on 01/29/2018 and consensus recommendation was to only repeat brain imaging if the patient develops symptoms.                       On review of systems, the patient states that she is doing well overall. Currently, she denies orthopnea, chest pain, dysphagia, fever, chills, cough or hemoptysis. She has baseline shortness of breath with exertion and fatigue which had remained unchanged since 10/2017, slightly increased with activity but stable over the past year.  She denies dyspnea at rest and reports that the shortness of breath with exertion resolves quickly with rest.  She has continued to tolerate the dose adjusted metoprolol for tachycardia and is followed with Dr. Peter Martinique. She denies headaches, changes in visual or auditory acuity, difficulty with speech or word finding, imbalance, tinnitus, tremor or seizure activity.    ALLERGIES:  is allergic to codeine; hydrocodone; and neosporin [neomycin-bacitracin zn-polymyx].  Meds: Current Outpatient Medications  Medication Sig Dispense Refill  . Artificial Tear Solution (BION TEARS OP) Place 1-2 drops into both eyes as needed (dry eyes).     Marland Kitchen aspirin 81 MG tablet Take 81 mg by mouth daily.    . cetirizine (ZYRTEC) 10 MG tablet Take 10 mg daily by mouth.     . Cholecalciferol (VITAMIN D-3) 1000 UNITS CAPS Take 1,000 Units daily by mouth.     . doxylamine, Sleep, (UNISOM) 25 MG tablet Take 25 mg at bedtime by mouth.    Marland Kitchen Glucosamine-Chondroit-MSM-C-Mn CAPS Take 1 capsule by mouth daily.    . Menthol, Topical Analgesic, (BIOFREEZE EX) Apply 1 application topically daily as needed (leg pain).    . metoprolol tartrate (LOPRESSOR) 25 MG tablet TAKE ONE TABLET TWICE DAILY 180 tablet  3  . mupirocin ointment (BACTROBAN) 2 % APPLY TO INFECTED AREA TWICE DAILY    . naphazoline-pheniramine (NAPHCON-A) 0.025-0.3 % ophthalmic solution Place 1-2 drops into both eyes as needed for eye irritation or allergies.     . pravastatin (PRAVACHOL) 40 MG tablet Take 40 mg daily by mouth.     . Probiotic Product (ALIGN) 4 MG CAPS Take 4 mg daily by mouth.    . SUPER B COMPLEX & C TABS Take 1 tablet by mouth daily.  0  . nitroGLYCERIN (NITROSTAT) 0.4 MG SL tablet Place 1 tablet (0.4 mg total) under the tongue every 5 (five) minutes as needed for chest pain. 90 tablet 3  . zolpidem (AMBIEN) 5 MG tablet Take 5 mg by mouth at bedtime as needed (for sleep.). Taking 1/2 tablet po at night  2   Current Facility-Administered Medications  Medication Dose Route Frequency Provider Last Rate Last Admin  . 0.9 %  sodium chloride infusion  500 mL Intravenous Once Ladene Artist, MD        Physical Findings:  vitals were not taken for this visit.   /10 Unable to  assess due to telephone follow up visit format.  Lab Findings: Lab Results  Component Value Date   WBC 7.7 09/01/2018   HGB 11.2 (L) 09/01/2018   HCT 34.0 (L) 09/01/2018   MCV 88.3 09/01/2018   PLT 188 09/01/2018     Radiographic Findings: CT Chest W Contrast  Result Date: 01/17/2020 CLINICAL DATA:  Follow-up lung cancer diagnosed in 2018, completed XRT and chemotherapy in 2019. Post bilateral breast lumpectomy, history of breast cancer. EXAM: CT CHEST, ABDOMEN, AND PELVIS WITH CONTRAST TECHNIQUE: Multidetector CT imaging of the chest, abdomen and pelvis was performed following the standard protocol during bolus administration of intravenous contrast. CONTRAST:  80mL OMNIPAQUE IOHEXOL 300 MG/ML  SOLN COMPARISON:  07/16/2019 FINDINGS: CT CHEST FINDINGS Cardiovascular: Calcified and noncalcified atherosclerotic plaque throughout the thoracic aorta. No signs of aneurysm. Calcified coronary artery disease. Findings unchanged compared to the  previous exam. No pericardial effusion. Central pulmonary vasculature is normal. Mediastinum/Nodes: Thoracic inlet structures are normal. No signs of adenopathy in the chest. 4 mm high AP window/prevascular lymph node unchanged. Similar low-density subcarinal lymph node less than a cm unchanged. Esophagus mildly patulous. Small hiatal hernia. No internal mammary adenopathy. Lungs/Pleura: Complete collapse of left lower lobe with associated pleural effusion and pleural thickening with diminished left-sided pleural fluid compared to the prior study. Masslike consolidation following radiation measuring 6.8 x 3.6 cm as compared to 7.2 by 4.2 cm. Pleural thickening approximately 6 mm in greatest thickness, at a similar location in the chest on the prior study approximately 4-5 mm, more visible on today's exam, no signs of gas or other suspicious finding. Thickening along the pleura in the left upper chest is unchanged compatible with scarring. Right lung shows compensatory hyperinflation with persistent nodularity of the minor fissure. This feature is unchanged. Musculoskeletal: Postoperative changes in the right breast similar to the prior study. CT ABDOMEN PELVIS FINDINGS Hepatobiliary: Stable low-density lesions in the liver with benign appearance 1 in the left hepatic lobe (image 60, series 2) 3 mm likely small cyst. Cholelithiasis. Mild extrahepatic biliary ductal distension unchanged. Pancreas: Pancreas with fatty replacement of pancreatic head. No ductal dilation or peripancreatic atrophy. Spleen: Spleen is normal. Adrenals/Urinary Tract: Adrenal glands are normal. Kidneys with smooth contours, no signs of hydronephrosis. Streak artifact does not allow for adequate evaluation of distal ureters. Urinary bladder also with limited assessment, under distended likely associated with descent of pelvic floor structures. Stomach/Bowel: No signs of acute gastrointestinal process. Colonic diverticulosis. Vascular/Lymphatic:  Moderate calcified and noncalcified plaque throughout the thoracic and abdominal aorta. No signs of adenopathy. Scattered small lymph nodes throughout the retroperitoneum without pathologic enlargement. No sign of pelvic lymphadenopathy. Reproductive: Uterus remains in situ with engorgement of left gonadal vein mild narrowing of left renal vein. Findings are nonspecific. No adnexal mass. Pelvic structures limited assessment secondary to streak artifact from left total hip arthroplasty. Other: No abdominal wall hernia or abnormality. No abdominopelvic ascites. Musculoskeletal: Left hip arthroplasty with resultant streak artifact. No signs of acute bone finding or destructive bone process. IMPRESSION: 1. Masslike consolidation in the left lower lobe is slightly smaller than the prior study compatible with prior radiotherapy. 2. Left pleural effusion with more organized "rind like appearance" some of this could be due to differences in technique or increase in reactive changes associated with this effusion in the pleural space. Continued attention on follow-up is suggested, perhaps at 3 months, and correlation with any symptoms of pain or infection. 3. No new or suspicious pulmonary nodules. 4. Stable   small mediastinal lymph nodes. 5. No signs of metastatic disease to the abdomen or pelvis. 6. Signs of pelvic floor dysfunction and and findings that could be seen in the setting of pelvic congestion syndrome. Aortic Atherosclerosis (ICD10-I70.0). Electronically Signed   By: Zetta Bills M.D.   On: 01/17/2020 16:48   CT Abdomen Pelvis W Contrast  Result Date: 01/17/2020 CLINICAL DATA:  Follow-up lung cancer diagnosed in 2018, completed XRT and chemotherapy in 2019. Post bilateral breast lumpectomy, history of breast cancer. EXAM: CT CHEST, ABDOMEN, AND PELVIS WITH CONTRAST TECHNIQUE: Multidetector CT imaging of the chest, abdomen and pelvis was performed following the standard protocol during bolus administration of  intravenous contrast. CONTRAST:  32m OMNIPAQUE IOHEXOL 300 MG/ML  SOLN COMPARISON:  07/16/2019 FINDINGS: CT CHEST FINDINGS Cardiovascular: Calcified and noncalcified atherosclerotic plaque throughout the thoracic aorta. No signs of aneurysm. Calcified coronary artery disease. Findings unchanged compared to the previous exam. No pericardial effusion. Central pulmonary vasculature is normal. Mediastinum/Nodes: Thoracic inlet structures are normal. No signs of adenopathy in the chest. 4 mm high AP window/prevascular lymph node unchanged. Similar low-density subcarinal lymph node less than a cm unchanged. Esophagus mildly patulous. Small hiatal hernia. No internal mammary adenopathy. Lungs/Pleura: Complete collapse of left lower lobe with associated pleural effusion and pleural thickening with diminished left-sided pleural fluid compared to the prior study. Masslike consolidation following radiation measuring 6.8 x 3.6 cm as compared to 7.2 by 4.2 cm. Pleural thickening approximately 6 mm in greatest thickness, at a similar location in the chest on the prior study approximately 4-5 mm, more visible on today's exam, no signs of gas or other suspicious finding. Thickening along the pleura in the left upper chest is unchanged compatible with scarring. Right lung shows compensatory hyperinflation with persistent nodularity of the minor fissure. This feature is unchanged. Musculoskeletal: Postoperative changes in the right breast similar to the prior study. CT ABDOMEN PELVIS FINDINGS Hepatobiliary: Stable low-density lesions in the liver with benign appearance 1 in the left hepatic lobe (image 60, series 2) 3 mm likely small cyst. Cholelithiasis. Mild extrahepatic biliary ductal distension unchanged. Pancreas: Pancreas with fatty replacement of pancreatic head. No ductal dilation or peripancreatic atrophy. Spleen: Spleen is normal. Adrenals/Urinary Tract: Adrenal glands are normal. Kidneys with smooth contours, no signs of  hydronephrosis. Streak artifact does not allow for adequate evaluation of distal ureters. Urinary bladder also with limited assessment, under distended likely associated with descent of pelvic floor structures. Stomach/Bowel: No signs of acute gastrointestinal process. Colonic diverticulosis. Vascular/Lymphatic: Moderate calcified and noncalcified plaque throughout the thoracic and abdominal aorta. No signs of adenopathy. Scattered small lymph nodes throughout the retroperitoneum without pathologic enlargement. No sign of pelvic lymphadenopathy. Reproductive: Uterus remains in situ with engorgement of left gonadal vein mild narrowing of left renal vein. Findings are nonspecific. No adnexal mass. Pelvic structures limited assessment secondary to streak artifact from left total hip arthroplasty. Other: No abdominal wall hernia or abnormality. No abdominopelvic ascites. Musculoskeletal: Left hip arthroplasty with resultant streak artifact. No signs of acute bone finding or destructive bone process. IMPRESSION: 1. Masslike consolidation in the left lower lobe is slightly smaller than the prior study compatible with prior radiotherapy. 2. Left pleural effusion with more organized "rind like appearance" some of this could be due to differences in technique or increase in reactive changes associated with this effusion in the pleural space. Continued attention on follow-up is suggested, perhaps at 3 months, and correlation with any symptoms of pain or infection. 3. No new or  suspicious pulmonary nodules. 4. Stable small mediastinal lymph nodes. 5. No signs of metastatic disease to the abdomen or pelvis. 6. Signs of pelvic floor dysfunction and and findings that could be seen in the setting of pelvic congestion syndrome. Aortic Atherosclerosis (ICD10-I70.0). Electronically Signed   By: Zetta Bills M.D.   On: 01/17/2020 16:48   US BREAST LTD UNI RIGHT INC AXILLA  Result Date: 12/23/2019 CLINICAL DATA:  86 year old  female for further evaluation of possible RIGHT breast mass on screening study. History of RIGHT breast cancer and lumpectomy in 2014 EXAM: DIGITAL DIAGNOSTIC RIGHT MAMMOGRAM WITH TOMO ULTRASOUND RIGHT BREAST COMPARISON:  Previous exam(s). ACR Breast Density Category c: The breast tissue is heterogeneously dense, which may obscure small masses. FINDINGS: 2D/3D spot compression views of the RIGHT breast demonstrate a persistent ill-defined mass within the UPPER OUTER RIGHT breast. On physical exam, a firm mass at the 11 o'clock position of the RIGHT breast 3 cm from the nipple. Targeted ultrasound is performed, showing a 4.2 x 1.7 x 3.2 cm irregular hypoechoic mass at the 11 o'clock position of the RIGHT breast 3 cm from the nipple. The superolateral aspect of this mass appears more irregular. No abnormal RIGHT axillary lymph nodes are identified. IMPRESSION: 1. Suspicious 4.2 cm UPPER-OUTER RIGHT breast mass. Tissue sampling of the more superolateral aspect of the mass (more irregular area) is recommended. 2. No abnormal appearing RIGHT axillary lymph nodes. RECOMMENDATION: Ultrasound-guided RIGHT breast biopsy, which will be scheduled. I have discussed the findings and recommendations with the patient. If applicable, a reminder letter will be sent to the patient regarding the next appointment. BI-RADS CATEGORY  4: Suspicious. Electronically Signed   By: Margarette Canada M.D.   On: 12/23/2019 13:53   MM DIAG BREAST TOMO UNI RIGHT  Result Date: 12/23/2019 CLINICAL DATA:  86 year old female for further evaluation of possible RIGHT breast mass on screening study. History of RIGHT breast cancer and lumpectomy in 2014 EXAM: DIGITAL DIAGNOSTIC RIGHT MAMMOGRAM WITH TOMO ULTRASOUND RIGHT BREAST COMPARISON:  Previous exam(s). ACR Breast Density Category c: The breast tissue is heterogeneously dense, which may obscure small masses. FINDINGS: 2D/3D spot compression views of the RIGHT breast demonstrate a persistent ill-defined  mass within the UPPER OUTER RIGHT breast. On physical exam, a firm mass at the 11 o'clock position of the RIGHT breast 3 cm from the nipple. Targeted ultrasound is performed, showing a 4.2 x 1.7 x 3.2 cm irregular hypoechoic mass at the 11 o'clock position of the RIGHT breast 3 cm from the nipple. The superolateral aspect of this mass appears more irregular. No abnormal RIGHT axillary lymph nodes are identified. IMPRESSION: 1. Suspicious 4.2 cm UPPER-OUTER RIGHT breast mass. Tissue sampling of the more superolateral aspect of the mass (more irregular area) is recommended. 2. No abnormal appearing RIGHT axillary lymph nodes. RECOMMENDATION: Ultrasound-guided RIGHT breast biopsy, which will be scheduled. I have discussed the findings and recommendations with the patient. If applicable, a reminder letter will be sent to the patient regarding the next appointment. BI-RADS CATEGORY  4: Suspicious. Electronically Signed   By: Margarette Canada M.D.   On: 12/23/2019 13:53   MM CLIP PLACEMENT RIGHT  Result Date: 01/01/2020 CLINICAL DATA:  Status post ultrasound-guided core needle biopsy of the recently demonstrated 4.2 cm mass in the 11 o'clock position of the right breast. EXAM: DIAGNOSTIC RIGHT MAMMOGRAM POST ULTRASOUND BIOPSY COMPARISON:  Previous exam(s). FINDINGS: Mammographic images were obtained following ultrasound guided biopsy of the recently demonstrated 4.2 cm mass in the  11 o'clock position of the right breast. The biopsy marking clip is in expected position at the site of biopsy. IMPRESSION: Appropriate positioning of the ribbon shaped biopsy marking clip at the site of biopsy in the mass centered in the 11 o'clock position of the right breast. The clip is in the posterior, superior, medial aspect of the mass, in the 12:30 o'clock position. Final Assessment: Post Procedure Mammograms for Marker Placement Electronically Signed   By: Steven  Reid M.D.   On: 01/01/2020 15:00   XR HIP UNILAT W OR W/O PELVIS 2-3  VIEWS LEFT  Result Date: 01/13/2020 A low AP pelvis and lateral of the left hip shows a well-seated total hip arthroplasty with no complicating features.  On the AP view the right hip shows mild arthritic changes.  US RT BREAST BX W LOC DEV 1ST LESION IMG BX SPEC US GUIDE  Addendum Date: 01/07/2020   ADDENDUM REPORT: 01/07/2020 07:08 ADDENDUM: Surgical consultation has been arranged, at the request of Dr. Vinay Gudena, with Dr. Doug Blackman at [facility] on January 10, 2020. Lynne Bailey, RN on 01/07/2020. Electronically Signed   By: Steven  Reid M.D.   On: 01/07/2020 07:08   Addendum Date: 01/02/2020   ADDENDUM REPORT: 01/02/2020 14:05 ADDENDUM: Pathology revealed GRADE II INVASIVE MAMMARY CARCINOMA, MAMMARY CARCINOMA IN SITU of the Right breast, 11 o'clock. This was found to be concordant by Dr. Steven Reid. Pathology results were discussed with the patient by telephone. The patient reported doing well after the biopsy with tenderness at the site. Post biopsy instructions and care were reviewed and questions were answered. The patient was encouraged to call [facility] for any additional concerns. Pathology results were called to Dawn Stuart, RN, BSN at [facility] on January 02, 2020. She will arrange an appointment with Dr. Vinay Gudena, Medical Oncologist, and contact the patient. Pathology results reported by Lynne Bailey, RN on 01/02/2020. Electronically Signed   By: Steven  Reid M.D.   On: 01/02/2020 14:05   Result Date: 01/07/2020 CLINICAL DATA:  4.2 cm mass in the 11 o'clock position of the right breast at recent mammography and ultrasound. Status post right lumpectomy breast cancer in 2014. She did not have radiation therapy. EXAM: ULTRASOUND GUIDED RIGHT BREAST CORE NEEDLE BIOPSY COMPARISON:  Previous exam(s). FINDINGS: I met with the patient and we discussed the procedure of ultrasound-guided biopsy, including benefits and alternatives. We  discussed the high likelihood of a successful procedure. We discussed the risks of the procedure, including infection, bleeding, tissue injury, clip migration, and inadequate sampling. Informed written consent was given. The usual time-out protocol was performed immediately prior to the procedure. Lesion quadrant: Upper outer quadrant Using sterile technique and 1% Lidocaine as local anesthetic, under direct ultrasound visualization, a 12 gauge spring-loaded device was used to perform biopsy of the recently demonstrated 4.2 cm mass in the 11 o'clock position of the right breast using a inferolateral approach. At the conclusion of the procedure ribbon shaped tissue marker clip was deployed into the biopsy cavity. Follow up 2 view mammogram was performed and dictated separately. IMPRESSION: Ultrasound guided biopsy of the recently demonstrated 4.2 cm mass in the 11 o'clock position of the right breast. No apparent complications. Electronically Signed: By: Steven  Reid M.D. On: 01/01/2020 14:47    Impression/Plan: 1. 86 y.o. female with synchronous NSCLC, adenocarcinoma with a 6 cm LLL mass extending into the left hilum (stage IIB, T3N0M0) and a 2.4 cm RML   mass (stage IB T2N0).  She remains stable both clinically and radiographically with regards to the lung cancer.  Her most recent CT Chest from 01/17/20 shows a stable appearance of presumed post radiation changes in the inferomedial left hemithorax with persistent collapse/consolidation in the left lower lobe and an unchanged moderate left pleural effusion, stable.  This will continue to be followed in observation with Dr. Gudena. Unfortunately, she has recently been found to have a recurrence of her right breast cancer and is scheduled for a lumpectomy with Dr. Blackman on 02/06/20.  She is adamantly not interested in adjuvant radiotherapy.  We will plan a follow up Chest CT in 3-4 months to  continue to monitor for any disease progression or recurrence  regarding her lung cancer.  She knows to call immediately for any progressive SOB or concerning symptoms in the interval. She is reluctant to pursue thoracentesis for pleural effusion unless felt absolutely necessary since she remains relatively asymptomatic.  We again discussed that should there be any progression in fluid accumulation or progressive shortness of breath with less exertion, we would need to consider moving forward with pulmonary evaluation and/or thoracentesis for therapeutic and diagnostic purposes.  She is in agreement with the stated plan.  2.         Meningioma. This was an incidental finding and patient is currently without neurologic or systemic complaints.  It does not appear to be causing a mass effect on any structures in the brain and remains stable on MRI brain from 01/24/2018. The consensus at brain conference from 01/29/2018 is to only repeat brain imaging if she becomes symptomatic.  Otherwise we will just follow this expectantly and only repeat further brain imaging should she develop concerning symptoms. She is in agreement with and is comfortable with this plan.  3. Tachycardia.  This is being managed by her cardiologist, Dr. Peter Jordan.  4. Recurrent right breast cancer. Recently found to have a recurrence of her right breast cancer with a 4.2 cm mass in the right breast detected on diagnostic mammography on December 23, 2019 with confirmation of a grade 2 invasive lobular carcinoma with LCIS, ER/PR positive, and HER-2 neu positive via right breast biopsy performed on 01/03/2020.  Fortunately, there was no evidence of distant metastatic disease on CT C/A/P 01/17/2020.  She is scheduled for a lumpectomy with Dr. Blackman on 02/06/20 but is adamantly not interested in adjuvant radiotherapy.  She will continue in routine follow up for continued systemic disease management with Dr. Gudena.      [name] W. Bruning, PA-C 

## 2020-01-23 ENCOUNTER — Other Ambulatory Visit (HOSPITAL_COMMUNITY): Payer: Medicare Other

## 2020-01-28 DIAGNOSIS — Z23 Encounter for immunization: Secondary | ICD-10-CM | POA: Diagnosis not present

## 2020-01-30 ENCOUNTER — Encounter (HOSPITAL_BASED_OUTPATIENT_CLINIC_OR_DEPARTMENT_OTHER): Payer: Self-pay | Admitting: Surgery

## 2020-01-30 ENCOUNTER — Other Ambulatory Visit: Payer: Self-pay

## 2020-02-03 ENCOUNTER — Other Ambulatory Visit (HOSPITAL_COMMUNITY)
Admission: RE | Admit: 2020-02-03 | Discharge: 2020-02-03 | Disposition: A | Discharge status: Home / Self Care | Payer: Medicare Other | Source: Ambulatory Visit | Attending: Surgery | Admitting: Surgery

## 2020-02-03 DIAGNOSIS — Z01812 Encounter for preprocedural laboratory examination: Secondary | ICD-10-CM | POA: Insufficient documentation

## 2020-02-03 DIAGNOSIS — Z20822 Contact with and (suspected) exposure to covid-19: Secondary | ICD-10-CM | POA: Diagnosis not present

## 2020-02-03 LAB — SARS CORONAVIRUS 2 (TAT 6-24 HRS): SARS Coronavirus 2: NEGATIVE

## 2020-02-03 NOTE — Progress Notes (Signed)

## 2020-02-07 ENCOUNTER — Other Ambulatory Visit: Payer: Self-pay

## 2020-02-07 ENCOUNTER — Other Ambulatory Visit (HOSPITAL_COMMUNITY): Payer: Medicare Other

## 2020-02-07 ENCOUNTER — Encounter (HOSPITAL_COMMUNITY): Payer: Self-pay | Admitting: Surgery

## 2020-02-07 ENCOUNTER — Other Ambulatory Visit (HOSPITAL_COMMUNITY)
Admission: RE | Admit: 2020-02-07 | Discharge: 2020-02-07 | Disposition: A | Discharge status: Home / Self Care | Payer: Medicare Other | Source: Ambulatory Visit | Attending: Surgery | Admitting: Surgery

## 2020-02-07 DIAGNOSIS — Z01812 Encounter for preprocedural laboratory examination: Secondary | ICD-10-CM | POA: Insufficient documentation

## 2020-02-07 DIAGNOSIS — Z20822 Contact with and (suspected) exposure to covid-19: Secondary | ICD-10-CM | POA: Diagnosis not present

## 2020-02-07 LAB — SARS CORONAVIRUS 2 (TAT 6-24 HRS): SARS Coronavirus 2: NEGATIVE

## 2020-02-07 NOTE — Progress Notes (Addendum)
[name] French denies chest pain or shortness of breath.  Patient denies any S/S of Covid, [name] French will be tested today for Covid.  [name] French was diagnosed with Sinus Tachycardia this year and was started on Metoprolol, EKG from 01/06/2019 shows NSR.  Dr. Neita Garnet , patients cardiologist has given clearance for surgery.  [name] French has stopped NSAIDs, Vitamins and Herbals products.  [name] French was scheduled to  Have surgery at [facility]- patient was given instructions there on ERAS an gave patient a Pre- Surgery Ensure. I instructed patient to not drink anything after 1000.

## 2020-02-07 NOTE — Anesthesia Preprocedure Evaluation (Addendum)
Anesthesia Evaluation  Patient identified by MRN, date of birth, ID band Patient awake    Reviewed: Allergy & Precautions, NPO status , Patient's Chart, lab work & pertinent test results  History of Anesthesia Complications Negative for: history of anesthetic complications  Airway Mallampati: II  TM Distance: >3 FB Neck ROM: Full    Dental  (+) Teeth Intact   Pulmonary shortness of breath and with exertion, former smoker,   lung cancer (poorly differentiated adenocarcinoma RML, LLL 10/31/17; s/p radiation 11/28/17-01/18/18 to LLL/RML lung lesions)   Pulmonary exam normal        Cardiovascular Normal cardiovascular exam+ dysrhythmias Supra Ventricular Tachycardia      Neuro/Psych Meningioma followed expectantly negative psych ROS   GI/Hepatic negative GI ROS, Neg liver ROS,   Endo/Other  negative endocrine ROS  Renal/GU negative Renal ROS  negative genitourinary   Musculoskeletal negative musculoskeletal ROS (+)   Abdominal   Peds  Hematology negative hematology ROS (+)   Anesthesia Other Findings  R breast cancer recurrence  Recent evaluation by cardiologist Dr. Martinique in January 2021 for inappropriate sinus tachycardia that improved on low dose beta-blocker therapy.  He wrote "She is cleared for surgery from a cardiac standpoint.  If she does go on Herceptin will need to update Echo."  Nuclear stress test 03/23/18:  Nuclear stress EF: 56%.  Clinically and electrically negative for ischemia  Normal perfusiion No ischemia or scar  This is a low risk study.   Echo 12/26/17: Study Conclusions  - Left ventricle: The cavity size was normal. Systolic function was  normal. The estimated ejection fraction was in the range of 60%  to 65%. Wall motion was normal; there were no regional wall  motion abnormalities. Left ventricular diastolic function  parameters were normal.  - Aortic valve: There was mild  regurgitation.  - Atrial septum: No defect or patent foramen ovale was identified.   Reproductive/Obstetrics                           Anesthesia Physical Anesthesia Plan  ASA: III  Anesthesia Plan: General   Post-op Pain Management:    Induction: Intravenous  PONV Risk Score and Plan: 3 and Ondansetron, Dexamethasone, Midazolam and Treatment may vary due to age or medical condition  Airway Management Planned: LMA  Additional Equipment: None  Intra-op Plan:   Post-operative Plan: Extubation in OR  Informed Consent: I have reviewed the patients History and Physical, chart, labs and discussed the procedure including the risks, benefits and alternatives for the proposed anesthesia with the patient or authorized representative who has indicated his/her understanding and acceptance.     Dental advisory given  Plan Discussed with:   Anesthesia Plan Comments: (PAT note written 02/07/2020 by Myra Gianotti, PA-C. )      Anesthesia Quick Evaluation

## 2020-02-07 NOTE — Progress Notes (Signed)
Anesthesia Chart Review: [name] French   Case: [id number] Date/Time: 02/10/20 1245   Procedure: RIGHT BREAST LUMPECTOMY (Right Breast)   Anesthesia type: General   Pre-op diagnosis: RECURRENT RIGHT BREAST CANCER   Location: [place] OR ROOM 02 / [place] OR   Surgeons: Coralie Keens, MD      DISCUSSION: Patient is a 86 year old female scheduled for the above procedure. It appears she is a same day work-up because recently moved from [place] to [facility].   History includes former smoker (quit 1980), lung cancer (poorly differentiated adenocarcinoma RML, LLL 10/31/17; s/p radiation 11/28/17-01/18/18 to LLL/RML lung lesions), right breast cancer (05/08/13, s/p bilateral lumpectomies with LCIS on the right, declined radiation and treated with anti-estrogen therapy; recurrence 113/21), skin cancer, osteoporosis, exertional dyspnea, tachycardia, meningioma.  Recent evaluation by cardiologist Dr. Martinique in January 2021 for inappropriate sinus tachycardia that improved on low dose beta-blocker therapy.  He wrote "She is cleared for surgery from a cardiac standpoint.  If she does go on Herceptin will need to update Echo."  Preoperative COVID-19 test negative on 02/03/20. Since she is a same day work-up then she is for labs and anesthesia team evaluation on the day of surgery.    VS: Ht 5\' 5"  (1.651 m)   Wt 59 kg   BMI 21.63 kg/m  BP Readings from Last 3 Encounters:  01/07/20 119/71  01/06/20 (!) 141/65  10/14/19 (!) 142/85   Pulse Readings from Last 3 Encounters:  01/07/20 78  01/06/20 (!) 115  10/14/19 (!) 110    PROVIDERS: Tisovec, Fransico Him, MD is PCP  - Nicholas Lose, MD is HEM-ONC Tyler Pita, MD is RAD-ONC. Last evaluation 01/22/20 by Freeman Caldron, PA-C.  She was felt both clinically and radiographically stable in regards to her lung cancer.  Should there be any progression of fluid accumulation or progressive shortness of breath then would consider pulmonology evaluation and  thoracentesis in the future.  In regards to her meningioma, patient remained neurologically intact.  "The consensus at brain conference from 01/29/2018 is to only repeat brain imaging if she becomes symptomatic.  Otherwise we will just follow this expectantly and only repeat further brain imaging should she develop concerning symptoms."  Patient has declined adjuvant radiotherapy for recurrent breast cancer. - Martinique, Peter, MD is cardiologist   LABS: She is for updated labs on the day of surgery.     IMAGES: CT chest/abd/pelvis 01/17/20: IMPRESSION: 1. Masslike consolidation in the left lower lobe is slightly smaller than the prior study compatible with prior radiotherapy. 2. Left pleural effusion with more organized "rind like appearance" some of this could be due to differences in technique or increase in reactive changes associated with this effusion in the pleural space. Continued attention on follow-up is suggested, perhaps at 3 months, and correlation with any symptoms of pain or infection. 3. No new or suspicious pulmonary nodules. 4. Stable small mediastinal lymph nodes. 5. No signs of metastatic disease to the abdomen or pelvis. 6. Signs of pelvic floor dysfunction and and findings that could be seen in the setting of pelvic congestion syndrome. Aortic Atherosclerosis (ICD10-I70.0).  MRI Brain 01/24/18: IMPRESSION: Negative for metastatic disease to the brain.Right temporoparietal meningioma unchanged in size. (The enhancing mass measures approximately 26 x 14 x 29 mm unchanged.) Mild vasogenic edema in the adjacent white matter improved from the prior study.   EKG: 01/07/20: NSR   CV: Nuclear stress test 03/23/18:  Nuclear stress EF: 56%.  Clinically and electrically negative for ischemia  Normal perfusiion No ischemia or scar  This is a low risk study.   Echo 12/26/17: Study Conclusions  - Left ventricle: The cavity size was normal. Systolic function was  normal.  The estimated ejection fraction was in the range of 60%  to 65%. Wall motion was normal; there were no regional wall  motion abnormalities. Left ventricular diastolic function  parameters were normal.  - Aortic valve: There was mild regurgitation.  - Atrial septum: No defect or patent foramen ovale was identified.     Past Medical History:  Diagnosis Date  . Allergic rhinitis   . Anemia    during pregnancy  . Arthritis   . Breast cancer ([place]) 05/08/13   right upper inner, invasive mammary  . Breast cancer, right breast ([place]) 04/16/2013   Underwent lumpectomy on 11/06/13. Path showed G1 ILC, 2.7 cm, neg margins, receptor+, Her2neg   . Diverticulosis   . Dyspnea    when carry heavy packages  . Dysrhythmia    Sinus Tach- on Metoprolol  . 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 Hemorrhoids   . Hx of adenomatous colonic polyps 05/2002  . Lung cancer ([place])   . Lung cancer ([place]) 10/2017  . Meningioma ([place])   . Osteoporosis   . Pneumonia     x2 last time was 2019  . Rosacea   . Skin cancer   . Tachycardia   . 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 Use of letrozole ([place])    neoadjuvant antiestrogen therapy with letrozole 2.5 mg daily x 7 monhts    Past Surgical History:  Procedure Laterality Date  . BREAST BIOPSY Left 1960   lt br bx/benign  . BREAST LUMPECTOMY Right   . BREAST LUMPECTOMY WITH NEEDLE LOCALIZATION Bilateral 11/06/2013   Procedure: BREAST LUMPECTOMY WITH NEEDLE LOCALIZATION;  Surgeon: Haywood Lasso, MD;  Location: [place];  Service: General;  Laterality: Bilateral;  . COLONOSCOPY    . EYE SURGERY Bilateral    both cataracts  . MOHS SURGERY Right    nose basal/squamous  . TOTAL HIP ARTHROPLASTY Left 08/31/2018   Procedure: LEFT TOTAL HIP ARTHROPLASTY ANTERIOR APPROACH;  Surgeon: Mcarthur Rossetti, MD;  Location: [facility];  Service: Orthopedics;  Laterality: Left;  Marland Kitchen VIDEO BRONCHOSCOPY WITH ENDOBRONCHIAL NAVIGATION N/A  11/01/2017   Procedure: VIDEO BRONCHOSCOPY WITH ENDOBRONCHIAL NAVIGATION;  Surgeon: [name], MD;  Location: [place];  Service: Thoracic;  Laterality: N/A;  . WRIST SURGERY  1990   lt    MEDICATIONS: . 0.9 %  sodium chloride infusion   . acetaminophen (TYLENOL) 500 MG tablet  . Artificial Tear Solution (BION TEARS OP)  . aspirin 81 MG tablet  . cetirizine (ZYRTEC) 10 MG tablet  . Cholecalciferol (VITAMIN D-3) 1000 UNITS CAPS  . doxylamine, Sleep, (UNISOM) 25 MG tablet  . Glucosamine-Chondroit-MSM-C-Mn CAPS  . ibuprofen (ADVIL) 200 MG tablet  . Menthol, Topical Analgesic, (BIOFREEZE EX)  . metoprolol tartrate (LOPRESSOR) 25 MG tablet  . naphazoline-pheniramine (NAPHCON-A) 0.025-0.3 % ophthalmic solution  . nitroGLYCERIN (NITROSTAT) 0.4 MG SL tablet  . Polyethyl Glycol-Propyl Glycol (SYSTANE OP)  . pravastatin (PRAVACHOL) 40 MG tablet  . Probiotic Product (ALIGN) 4 MG CAPS  . SUPER B COMPLEX & C TABS  . zolpidem (AMBIEN) 5 MG tablet    Myra Gianotti, PA-C Surgical Short Stay/Anesthesiology [facility] Phone [phone] [facility] Phone [phone] 02/07/2020 1:02 PM

## 2020-02-09 NOTE — H&P (Signed)
[name] French  Location: [facility] Patient #: [id number] DOB: October 07, 1926 Single / Language: Cleophus Molt / Race: White Female   History of Present Illness (Greg Eckrich A. Ninfa Linden MD; 01/10/2020 10:03 AM) The patient is a 86 year old female who presents with breast cancer. She is here for evaluation of a recurrent right breast cancer. She has a history of lung cancer as well as a right breast cancer. Her last surgery was several years ago by Dr. Margot Chimes. She has been doing well. She, however, developed a mass in the area of the previous lumpectomy of the right breast. She underwent a biopsy showing it to be an invasive carcinoma which was ER and PR positive as well as HER-2 positive. She has already been cleared by cardiology for surgery. She is currently without complaints and is actually doing very well   Past Surgical History Andreas Blower, [place]; 01/10/2020 9:51 AM) Breast Biopsy  Right. Colon Polyp Removal - Colonoscopy  Hip Surgery  Left. Oral Surgery   Diagnostic Studies History Andreas Blower, [place]; 01/10/2020 9:51 AM) Colonoscopy  5-10 years ago Mammogram  within last year Pap Smear  1-5 years ago  Allergies Andreas Blower, CMA; 01/10/2020 9:53 AM) CODEINE  Neosporin *DERMATOLOGICALS*  Rash. HYDROCODONE   Medication History (Armen Ferguson, CMA; 01/10/2020 9:55 AM) Metoprolol Tartrate ('25MG'$  Tablet, Oral) Active. Mupirocin (2% Ointment, External) Active. Pravastatin Sodium ('40MG'$  Tablet, Oral) Active. Aspirin ('81MG'$  Tablet, Oral) Active. ZyrTEC ('10MG'$  Tablet, Oral) Active. Zolpidem Tartrate ('5MG'$  Tablet, Oral) Active. Medications Reconciled  Social History Andreas Blower, CMA; 01/10/2020 9:51 AM) Alcohol use  Moderate alcohol use. Tobacco use  Former smoker.  Family History Andreas Blower, [place]; 01/10/2020 9:51 AM) Arthritis  Father, Mother. Cerebrovascular Accident  Mother. Colon Polyps  Son. Depression  Mother. Heart Disease   Mother. Prostate Cancer  Father.  Pregnancy / Birth History Andreas Blower, [place]; 01/10/2020 9:51 AM) Age at menarche  62 years. Age of menopause  [age] Contraceptive History  Oral contraceptives. Gravida  2 Length (months) of breastfeeding  3-6 Para  3 Regular periods   Other Problems (Armen Ferguson, CMA; 01/10/2020 9:51 AM) Arthritis  Breast Cancer  Hypercholesterolemia  Lump In Breast  Lung Cancer     Review of Systems (Armen Ferguson CMA; 01/10/2020 9:51 AM) General Not Present- Appetite Loss, Chills, Fatigue, Fever, Night Sweats, Weight Gain and Weight Loss. Skin Not Present- Change in Wart/Mole, Dryness, Hives, Jaundice, New Lesions, Non-Healing Wounds, Rash and Ulcer. HEENT Present- Seasonal Allergies. Not Present- Earache, Hearing Loss, Hoarseness, Nose Bleed, Oral Ulcers, Ringing in the Ears, Sinus Pain, Sore Throat, Visual Disturbances, Wears glasses/contact lenses and Yellow Eyes. Breast Present- Breast Mass. Not Present- Breast Pain, Nipple Discharge and Skin Changes. Gastrointestinal Not Present- Abdominal Pain, Bloating, Bloody Stool, Change in Bowel Habits, Chronic diarrhea, Constipation, Difficulty Swallowing, Excessive gas, Gets full quickly at meals, Hemorrhoids, Indigestion, Nausea, Rectal Pain and Vomiting. Musculoskeletal Present- Joint Pain and Joint Stiffness. Not Present- Back Pain, Muscle Pain, Muscle Weakness and Swelling of Extremities. Neurological Not Present- Decreased Memory, Fainting, Headaches, Numbness, Seizures, Tingling, Tremor, Trouble walking and Weakness. Psychiatric Not Present- Anxiety, Bipolar, Change in Sleep Pattern, Depression, Fearful and Frequent crying. Endocrine Not Present- Cold Intolerance, Excessive Hunger, Hair Changes, Heat Intolerance, Hot flashes and New Diabetes. Hematology Not Present- Blood Thinners, Easy Bruising, Excessive bleeding, Gland problems, HIV and Persistent Infections.  Vitals (Armen Ferguson CMA;  01/10/2020 9:53 AM) 01/10/2020 9:52 AM Weight: 130.25 lb Height: 65in Body Surface Area: 1.65 m Body Mass Index: 21.67 kg/m  Temp.: 97.36F  Pulse: 78 (Regular)  P.OX: 95% (Room air) BP: 120/72 (Sitting, Left Arm, Standard)       Physical Exam ( The physical exam findings are as follows: Note:She looks well today.  On examination of her right breast, there is a large, 4-5 cm mass in the upper outer quadrant near her old scar at the 11:30 position. It is slightly mobile. There is no adenopathy. There are no skin changes.    Assessment & Plan   RECURRENT BREAST CANCER, RIGHT (C50.911) Impression: I had a discussion with the patient regarding the diagnosis. We discussed lumpectomy versus mastectomy on the right side. She is already decided that she was to proceed with just a right breast lumpectomy understanding there could be a cosmetic defect. I did discuss the risks of surgery which includes but is not limited to bleeding, infection, the need for further surgery margins are positive, cardiopulmonary issues, postoperative recovery, etc. She understands and wishes to proceed with a right breast lumpectomy. Surgery will be scheduled.

## 2020-02-10 ENCOUNTER — Ambulatory Visit (HOSPITAL_COMMUNITY): Payer: Medicare Other | Admitting: Vascular Surgery

## 2020-02-10 ENCOUNTER — Encounter (HOSPITAL_COMMUNITY): Payer: Self-pay | Admitting: Surgery

## 2020-02-10 ENCOUNTER — Other Ambulatory Visit: Payer: Self-pay

## 2020-02-10 ENCOUNTER — Ambulatory Visit (HOSPITAL_COMMUNITY)
Admission: RE | Admit: 2020-02-10 | Discharge: 2020-02-10 | Disposition: A | Discharge status: Home / Self Care | Payer: Medicare Other | Attending: Surgery | Admitting: Surgery

## 2020-02-10 ENCOUNTER — Encounter (HOSPITAL_COMMUNITY)
Admission: RE | Disposition: A | Discharge status: Home / Self Care | Payer: Self-pay | Source: Home / Self Care | Attending: Surgery

## 2020-02-10 DIAGNOSIS — Z923 Personal history of irradiation: Secondary | ICD-10-CM | POA: Insufficient documentation

## 2020-02-10 DIAGNOSIS — Z85118 Personal history of other malignant neoplasm of bronchus and lung: Secondary | ICD-10-CM | POA: Insufficient documentation

## 2020-02-10 DIAGNOSIS — N6489 Other specified disorders of breast: Secondary | ICD-10-CM | POA: Diagnosis not present

## 2020-02-10 DIAGNOSIS — C50211 Malignant neoplasm of upper-inner quadrant of right female breast: Secondary | ICD-10-CM | POA: Diagnosis not present

## 2020-02-10 DIAGNOSIS — Z79899 Other long term (current) drug therapy: Secondary | ICD-10-CM | POA: Insufficient documentation

## 2020-02-10 DIAGNOSIS — C50411 Malignant neoplasm of upper-outer quadrant of right female breast: Secondary | ICD-10-CM | POA: Diagnosis not present

## 2020-02-10 DIAGNOSIS — Z96642 Presence of left artificial hip joint: Secondary | ICD-10-CM | POA: Diagnosis not present

## 2020-02-10 DIAGNOSIS — Z170 Estrogen receptor positive status [ER+]: Secondary | ICD-10-CM | POA: Insufficient documentation

## 2020-02-10 DIAGNOSIS — E7800 Pure hypercholesterolemia, unspecified: Secondary | ICD-10-CM | POA: Insufficient documentation

## 2020-02-10 DIAGNOSIS — Z7982 Long term (current) use of aspirin: Secondary | ICD-10-CM | POA: Diagnosis not present

## 2020-02-10 DIAGNOSIS — Z85828 Personal history of other malignant neoplasm of skin: Secondary | ICD-10-CM | POA: Diagnosis not present

## 2020-02-10 DIAGNOSIS — Z87891 Personal history of nicotine dependence: Secondary | ICD-10-CM | POA: Diagnosis not present

## 2020-02-10 DIAGNOSIS — K648 Other hemorrhoids: Secondary | ICD-10-CM | POA: Diagnosis not present

## 2020-02-10 DIAGNOSIS — C50911 Malignant neoplasm of unspecified site of right female breast: Secondary | ICD-10-CM | POA: Diagnosis not present

## 2020-02-10 DIAGNOSIS — K5730 Diverticulosis of large intestine without perforation or abscess without bleeding: Secondary | ICD-10-CM | POA: Diagnosis not present

## 2020-02-10 DIAGNOSIS — C3432 Malignant neoplasm of lower lobe, left bronchus or lung: Secondary | ICD-10-CM | POA: Diagnosis not present

## 2020-02-10 HISTORY — PX: BREAST LUMPECTOMY: SHX2

## 2020-02-10 HISTORY — DX: Unspecified malignant neoplasm of skin, unspecified: C44.90

## 2020-02-10 HISTORY — DX: Cardiac arrhythmia, unspecified: I49.9

## 2020-02-10 HISTORY — DX: Dyspnea, unspecified: R06.00

## 2020-02-10 HISTORY — DX: Tachycardia, unspecified: R00.0

## 2020-02-10 LAB — CBC
HCT: 43 % (ref 36.0–46.0)
Hemoglobin: 13.5 g/dL (ref 12.0–15.0)
MCH: 27.1 pg (ref 26.0–34.0)
MCHC: 31.4 g/dL (ref 30.0–36.0)
MCV: 86.3 fL (ref 80.0–100.0)
Platelets: 227 10*3/uL (ref 150–400)
RBC: 4.98 MIL/uL (ref 3.87–5.11)
RDW: 15.1 % (ref 11.5–15.5)
WBC: 5.9 10*3/uL (ref 4.0–10.5)
nRBC: 0 % (ref 0.0–0.2)

## 2020-02-10 LAB — BASIC METABOLIC PANEL
Anion gap: 13 (ref 5–15)
BUN: 11 mg/dL (ref 8–23)
CO2: 25 mmol/L (ref 22–32)
Calcium: 9.6 mg/dL (ref 8.9–10.3)
Chloride: 100 mmol/L (ref 98–111)
Creatinine, Ser: 0.75 mg/dL (ref 0.44–1.00)
GFR calc Af Amer: >60 mL/min (ref >60)
GFR calc non Af Amer: >60 mL/min (ref >60)
Glucose, Bld: 136 mg/dL — ABNORMAL HIGH (ref 70–99)
Potassium: 4 mmol/L (ref 3.5–5.1)
Sodium: 138 mmol/L (ref 135–145)

## 2020-02-10 SURGERY — BREAST LUMPECTOMY
Anesthesia: General | Site: Breast | Laterality: Right

## 2020-02-10 MED ORDER — CEFAZOLIN SODIUM-DEXTROSE 2-4 GM/100ML-% IV SOLN
2.0000 g | INTRAVENOUS | Status: AC
  Administered 2020-02-10: 2 g via INTRAVENOUS
  Filled 2020-02-10: qty 100

## 2020-02-10 MED ORDER — LACTATED RINGERS IV SOLN: INTRAVENOUS | Status: DC

## 2020-02-10 MED ORDER — OXYCODONE HCL 5 MG/5ML PO SOLN: 5.0000 mg | Freq: Once | ORAL | Status: AC | PRN

## 2020-02-10 MED ORDER — CHLORHEXIDINE GLUCONATE CLOTH 2 % EX PADS: 6.0000 | MEDICATED_PAD | Freq: Once | CUTANEOUS | Status: DC

## 2020-02-10 MED ORDER — DEXAMETHASONE SODIUM PHOSPHATE 4 MG/ML IJ SOLN
INTRAMUSCULAR | Status: DC | PRN
  Administered 2020-02-10: 5 mg via INTRAVENOUS

## 2020-02-10 MED ORDER — BUPIVACAINE-EPINEPHRINE 0.25% -1:200000 IJ SOLN
INTRAMUSCULAR | Status: DC | PRN
  Administered 2020-02-10: 20 mL

## 2020-02-10 MED ORDER — ONDANSETRON HCL 4 MG/2ML IJ SOLN: 4.0000 mg | Freq: Once | INTRAMUSCULAR | Status: DC | PRN

## 2020-02-10 MED ORDER — PROPOFOL 10 MG/ML IV BOLUS
INTRAVENOUS | Status: DC | PRN
  Administered 2020-02-10: 80 mg via INTRAVENOUS

## 2020-02-10 MED ORDER — ONDANSETRON HCL 4 MG/2ML IJ SOLN
INTRAMUSCULAR | Status: DC | PRN
  Administered 2020-02-10: 4 mg via INTRAVENOUS

## 2020-02-10 MED ORDER — ONDANSETRON HCL 4 MG/2ML IJ SOLN
INTRAMUSCULAR | Status: AC
  Filled 2020-02-10: qty 2

## 2020-02-10 MED ORDER — FENTANYL CITRATE (PF) 250 MCG/5ML IJ SOLN
INTRAMUSCULAR | Status: AC
  Filled 2020-02-10: qty 5

## 2020-02-10 MED ORDER — PROPOFOL 10 MG/ML IV BOLUS
INTRAVENOUS | Status: AC
  Filled 2020-02-10: qty 20

## 2020-02-10 MED ORDER — LIDOCAINE 2% (20 MG/ML) 5 ML SYRINGE
INTRAMUSCULAR | Status: DC | PRN
  Administered 2020-02-10: 40 mg via INTRAVENOUS

## 2020-02-10 MED ORDER — OXYCODONE HCL 5 MG PO TABS
ORAL_TABLET | ORAL | Status: AC
  Filled 2020-02-10: qty 1

## 2020-02-10 MED ORDER — FENTANYL CITRATE (PF) 100 MCG/2ML IJ SOLN: 25.0000 ug | INTRAMUSCULAR | Status: DC | PRN

## 2020-02-10 MED ORDER — OXYCODONE HCL 5 MG PO TABS
5.0000 mg | ORAL_TABLET | Freq: Once | ORAL | Status: AC | PRN
  Administered 2020-02-10: 5 mg via ORAL

## 2020-02-10 MED ORDER — ACETAMINOPHEN 500 MG PO TABS
1000.0000 mg | ORAL_TABLET | ORAL | Status: AC
  Administered 2020-02-10: 1000 mg via ORAL
  Filled 2020-02-10: qty 2

## 2020-02-10 MED ORDER — DEXAMETHASONE SODIUM PHOSPHATE 10 MG/ML IJ SOLN
INTRAMUSCULAR | Status: AC
  Filled 2020-02-10: qty 1

## 2020-02-10 MED ORDER — GABAPENTIN 300 MG PO CAPS
300.0000 mg | ORAL_CAPSULE | ORAL | Status: AC
  Administered 2020-02-10: 11:00:00 300 mg via ORAL
  Filled 2020-02-10: qty 1

## 2020-02-10 MED ORDER — LIDOCAINE 2% (20 MG/ML) 5 ML SYRINGE
INTRAMUSCULAR | Status: AC
  Filled 2020-02-10: qty 5

## 2020-02-10 MED ORDER — FENTANYL CITRATE (PF) 100 MCG/2ML IJ SOLN
INTRAMUSCULAR | Status: DC | PRN
  Administered 2020-02-10: 50 ug via INTRAVENOUS

## 2020-02-10 MED ORDER — TRAMADOL HCL 50 MG PO TABS: 50.0000 mg | ORAL_TABLET | Freq: Four times a day (QID) | ORAL | 0 refills | Status: DC | PRN

## 2020-02-10 SURGICAL SUPPLY — 40 items
ADH SKN CLS APL DERMABOND .7 (GAUZE/BANDAGES/DRESSINGS) ×1
APL PRP STRL LF DISP 70% ISPRP (MISCELLANEOUS) ×1
BINDER BREAST LRG (GAUZE/BANDAGES/DRESSINGS) IMPLANT
BINDER BREAST MEDIUM (GAUZE/BANDAGES/DRESSINGS) ×2 IMPLANT
BINDER BREAST XLRG (GAUZE/BANDAGES/DRESSINGS) IMPLANT
CANISTER SUCT 3000ML PPV (MISCELLANEOUS) IMPLANT
CHLORAPREP W/TINT 26 (MISCELLANEOUS) ×3 IMPLANT
CNTNR URN SCR LID CUP LEK RST (MISCELLANEOUS) IMPLANT
CONT SPEC 4OZ STRL OR WHT (MISCELLANEOUS)
COVER SURGICAL LIGHT HANDLE (MISCELLANEOUS) ×3 IMPLANT
COVER WAND RF STERILE (DRAPES) ×1 IMPLANT
DECANTER SPIKE VIAL GLASS SM (MISCELLANEOUS) ×3 IMPLANT
DERMABOND ADVANCED (GAUZE/BANDAGES/DRESSINGS) ×2
DERMABOND ADVANCED .7 DNX12 (GAUZE/BANDAGES/DRESSINGS) ×1 IMPLANT
DEVICE DUBIN SPECIMEN MAMMOGRA (MISCELLANEOUS) IMPLANT
DRAPE LAPAROTOMY 100X72 PEDS (DRAPES) ×3 IMPLANT
ELECT REM PT RETURN 9FT ADLT (ELECTROSURGICAL) ×3
ELECTRODE REM PT RTRN 9FT ADLT (ELECTROSURGICAL) ×1 IMPLANT
GAUZE 4X4 16PLY RFD (DISPOSABLE) ×1 IMPLANT
GAUZE SPONGE 4X4 12PLY STRL LF (GAUZE/BANDAGES/DRESSINGS) ×2 IMPLANT
GLOVE SURG SIGNA 7.5 PF LTX (GLOVE) ×3 IMPLANT
GOWN STRL REUS W/ TWL LRG LVL3 (GOWN DISPOSABLE) ×1 IMPLANT
GOWN STRL REUS W/ TWL XL LVL3 (GOWN DISPOSABLE) ×1 IMPLANT
GOWN STRL REUS W/TWL LRG LVL3 (GOWN DISPOSABLE) ×6
GOWN STRL REUS W/TWL XL LVL3 (GOWN DISPOSABLE) ×3
KIT BASIN OR (CUSTOM PROCEDURE TRAY) ×3 IMPLANT
KIT MARKER MARGIN INK (KITS) ×2 IMPLANT
KIT TURNOVER KIT B (KITS) ×3 IMPLANT
NDL HYPO 25GX1X1/2 BEV (NEEDLE) ×1 IMPLANT
NEEDLE HYPO 25GX1X1/2 BEV (NEEDLE) ×3 IMPLANT
NS IRRIG 1000ML POUR BTL (IV SOLUTION) ×3 IMPLANT
PACK GENERAL/GYN (CUSTOM PROCEDURE TRAY) ×3 IMPLANT
PAD ARMBOARD 7.5X6 YLW CONV (MISCELLANEOUS) ×3 IMPLANT
PENCIL SMOKE EVACUATOR (MISCELLANEOUS) ×3 IMPLANT
SUT MNCRL AB 4-0 PS2 18 (SUTURE) ×3 IMPLANT
SUT VIC AB 3-0 SH 27 (SUTURE) ×3
SUT VIC AB 3-0 SH 27X BRD (SUTURE) ×1 IMPLANT
SYR CONTROL 10ML LL (SYRINGE) ×3 IMPLANT
TOWEL GREEN STERILE (TOWEL DISPOSABLE) ×3 IMPLANT
TOWEL GREEN STERILE FF (TOWEL DISPOSABLE) ×3 IMPLANT

## 2020-02-10 NOTE — Anesthesia Procedure Notes (Signed)
Procedure Name: LMA Insertion Date/Time: 02/10/2020 1:23 PM Performed by: Trinna Post., CRNA Pre-anesthesia Checklist: Patient identified, Emergency Drugs available, Suction available, Patient being monitored and Timeout performed Patient Re-evaluated:Patient Re-evaluated prior to induction Oxygen Delivery Method: Circle system utilized Preoxygenation: Pre-oxygenation with 100% oxygen Induction Type: IV induction LMA: LMA inserted LMA Size: 4.0 Number of attempts: 1 Placement Confirmation: ETT inserted through vocal cords under direct vision,  positive ETCO2 and breath sounds checked- equal and bilateral Tube secured with: Tape Dental Injury: Teeth and Oropharynx as per pre-operative assessment

## 2020-02-10 NOTE — Interval H&P Note (Signed)
History and Physical Interval Note:no change in H and P  02/10/2020 1:01 PM  [name] French  has presented today for surgery, with the diagnosis of RECURRENT RIGHT BREAST CANCER.  The various methods of treatment have been discussed with the patient and family. After consideration of risks, benefits and other options for treatment, the patient has consented to  Procedure(s): RIGHT BREAST LUMPECTOMY (Right) as a surgical intervention.  The patient's history has been reviewed, patient examined, no change in status, stable for surgery.  I have reviewed the patient's chart and labs.  Questions were answered to the patient's satisfaction.     Coralie Keens

## 2020-02-10 NOTE — Op Note (Signed)
   [name] French 02/10/2020   Pre-op Diagnosis: RECURRENT RIGHT BREAST CANCER     Post-op Diagnosis: same  Procedure(s): RIGHT BREAST PARTIAL MASTECTOMY  Surgeon(s): Coralie Keens, MD  Anesthesia: General  Staff:  Circulator: Cyd Silence, RN Scrub Person: Gleason, Chloe, RN; Dollene Cleveland T  Estimated Blood Loss: Minimal               Specimens: sent to path  Indications: This is a 86 year old female with a recurrent right breast cancer.  She is also undergoing treatment for lung cancer.  She has developed a recurrent mass in the upper outer quadrant of the left breast.  After mastectomy was recommended, she wishes to just undergo a partial mastectomy and no other treatment.  Procedure: The patient was brought to operating identifies correct patient.  She is placed upon the operating table general esthesia was induced.  Right breast was prepped and draped in usual sterile fashion.  There was a large, mobile mass in the upper outer quadrant of the breast near the nipple areolar complex and just inferior to her old incision.  I anesthetized skin with Marcaine and then made an elliptical incision longitudinally across the breast overlying the palpable mass.  I then dissected down into the breast tissue with electrocautery.  I then performed a wide partial mastectomy staying widely around the mass.  I undermined at the superior edge of the nipple areolar complex as well.  I then took this all the way down to the chest wall and excised the mass and overlying breast tissue off the pectoralis muscle.  Once the large mass was removed, I marked all margins with paint, and the specimen was sent to pathology for evaluation.  Hemostasis was achieved with cautery.  I anesthetized surrounding tissue further with Marcaine.  I then closed the incision in layers of 3-0 Vicryl sutures and then closed the skin with a running 4-0 Monocryl.  Dermabond was then applied.  The patient tolerated  the procedure well.  All the counts were correct at the end of the procedure.  The patient was then extubated in the operating room and taken in stable condition to the recovery room.          Coralie Keens   Date: 02/10/2020  Time: 1:57 PM

## 2020-02-10 NOTE — Discharge Instructions (Signed)
[place] Office Phone Number [phone]  BREAST BIOPSY/ PARTIAL MASTECTOMY: POST OP INSTRUCTIONS  Always review your discharge instruction sheet given to you by the facility where your surgery was performed.  IF YOU HAVE DISABILITY OR FAMILY LEAVE FORMS, YOU MUST BRING THEM TO THE OFFICE FOR PROCESSING.  DO NOT GIVE THEM TO YOUR DOCTOR.  1. A prescription for pain medication may be given to you upon discharge.  Take your pain medication as prescribed, if needed.  If narcotic pain medicine is not needed, then you may take acetaminophen (Tylenol) or ibuprofen (Advil) as needed. 2. Take your usually prescribed medications unless otherwise directed 3. If you need a refill on your pain medication, please contact your pharmacy.  They will contact our office to request authorization.  Prescriptions will not be filled after 5pm or on week-ends. 4. You should eat very light the first 24 hours after surgery, such as soup, crackers, pudding, etc.  Resume your normal diet the day after surgery. 5. Most patients will experience some swelling and bruising in the breast.  Ice packs and a good support bra will help.  Swelling and bruising can take several days to resolve.  6. It is common to experience some constipation if taking pain medication after surgery.  Increasing fluid intake and taking a stool softener will usually help or prevent this problem from occurring.  A mild laxative (Milk of Magnesia or Miralax) should be taken according to package directions if there are no bowel movements after 48 hours. 7. Unless discharge instructions indicate otherwise, you may remove your bandages 24-48 hours after surgery, and you may shower at that time.  You may have steri-strips (small skin tapes) in place directly over the incision.  These strips should be left on the skin for 7-10 days.  If your surgeon used skin glue on the incision, you may shower in 24 hours.  The glue will flake off over the  next 2-3 weeks.  Any sutures or staples will be removed at the office during your follow-up visit. 8. ACTIVITIES:  You may resume regular daily activities (gradually increasing) beginning the next day.  Wearing a good support bra or sports bra minimizes pain and swelling.  You may have sexual intercourse when it is comfortable. a. You may drive when you no longer are taking prescription pain medication, you can comfortably wear a seatbelt, and you can safely maneuver your car and apply brakes. b. RETURN TO WORK:  ______________________________________________________________________________________ 9. You should see your doctor in the office for a follow-up appointment approximately two weeks after your surgery.  Your doctor's nurse will typically make your follow-up appointment when she calls you with your pathology report.  Expect your pathology report 2-3 business days after your surgery.  You may call to check if you do not hear from [place] after three days. 10. OTHER INSTRUCTIONS:OK TO REMOVE THE BINDER AND SHOWER STARTING TOMORROW 11. ICE PACK, TYLENOL, IBUPROFEN ALSO FOR PAIN 12. NO VIGOROUS ACTIVITY OR SWIMMING FOR AT LEAST 1 WEEKS _______________________________________________________________________________________________ _____________________________________________________________________________________________________________________________________ _____________________________________________________________________________________________________________________________________ _____________________________________________________________________________________________________________________________________  WHEN TO CALL YOUR DOCTOR: 1. Fever over 101.0 2. Nausea and/or vomiting. 3. Extreme swelling or bruising. 4. Continued bleeding from incision. 5. Increased pain, redness, or drainage from the incision.  The clinic staff is available to answer your questions during regular  business hours.  Please don't hesitate to call and ask to speak to one of the nurses for clinical concerns.  If you have a medical emergency, go to the nearest  emergency room or call 911.  A surgeon from [facility] is always on call at the hospital.  For further questions, please visit [email or web address]

## 2020-02-10 NOTE — Interval H&P Note (Signed)
History and Physical Interval Note: no change in H and P  02/10/2020 10:49 AM  [name] French  has presented today for surgery, with the diagnosis of RECURRENT RIGHT BREAST CANCER.  The various methods of treatment have been discussed with the patient and family. After consideration of risks, benefits and other options for treatment, the patient has consented to  Procedure(s): RIGHT BREAST LUMPECTOMY (Right) as a surgical intervention.  The patient's history has been reviewed, patient examined, no change in status, stable for surgery.  I have reviewed the patient's chart and labs.  Questions were answered to the patient's satisfaction.     Coralie Keens

## 2020-02-10 NOTE — Transfer of Care (Signed)
Immediate Anesthesia Transfer of Care Note  Patient: [name] French  Procedure(s) Performed: RIGHT BREAST LUMPECTOMY (Right Breast)  Patient Location: PACU  Anesthesia Type:General  Level of Consciousness: awake, alert  and oriented  Airway & Oxygen Therapy: Patient Spontanous Breathing and Patient connected to nasal cannula oxygen  Post-op Assessment: Report given to RN and Post -op Vital signs reviewed and stable  Post vital signs: Reviewed and stable  Last Vitals:  Vitals Value Taken Time  BP 117/47 02/10/20 1408  Temp    Pulse 103 02/10/20 1409  Resp 15 02/10/20 1409  SpO2 98 % 02/10/20 1409  Vitals shown include unvalidated device data.  Last Pain:  Vitals:   02/10/20 1114  TempSrc:   PainSc: 0-No pain         Complications: No apparent anesthesia complications

## 2020-02-11 ENCOUNTER — Encounter: Payer: Self-pay | Admitting: *Deleted

## 2020-02-11 NOTE — Anesthesia Postprocedure Evaluation (Signed)
Anesthesia Post Note  Patient: [name] French  Procedure(s) Performed: RIGHT BREAST LUMPECTOMY (Right Breast)     Patient location during evaluation: PACU Anesthesia Type: General Level of consciousness: awake and alert Pain management: pain level controlled Vital Signs Assessment: post-procedure vital signs reviewed and stable Respiratory status: spontaneous breathing, nonlabored ventilation and respiratory function stable Cardiovascular status: blood pressure returned to baseline and stable Postop Assessment: no apparent nausea or vomiting Anesthetic complications: no    Last Vitals:  Vitals:   02/10/20 1440 02/10/20 1455  BP: 119/68 113/69  Pulse: (!) 101 99  Resp: 19 10  Temp:    SpO2: 96% 98%    Last Pain:  Vitals:   02/10/20 1410  TempSrc:   PainSc: 7                  [name] E Gal[place] Logie

## 2020-02-12 LAB — SURGICAL PATHOLOGY

## 2020-02-13 NOTE — Progress Notes (Signed)
Patient Care Team: Tisovec, Fransico Him, MD as PCP - General (Internal Medicine) [place]ique, Peter M, MD as PCP - Cardiology (Cardiology) Marcy Panning, MD as Consulting Physician (Oncology)  DIAGNOSIS:    ICD-10-CM   1. Malignant neoplasm of upper-inner quadrant of right breast in female, estrogen receptor positive ([place])  C50.211    Z17.0     SUMMARY OF ONCOLOGIC HISTORY: Oncology History  Breast cancer of upper-inner quadrant of right female breast ([place])  05/08/2013 Initial Biopsy   Right: grade 1-2 invasive mammary carcinoma ER positive PR positive HER-2/neu negative with Ki-67 30% (MRI 2.8 cm)second smaller mass together 3.8cm   05/11/2013 - 11/04/2013 Anti-estrogen oral therapy   Letrozole 2.5 mg Neoadjuvant anti-estrogen therapy   11/06/2013 Surgery   Bilateral Lumpectomies: Left: sclerosing lesion with ALH fibrocystic changes with microcalcifications. Right: Grade 1 ILC 2.7 cm with LCIS    Radiation Therapy   Patient declined   11/19/2013 - 06/09/2015 Anti-estrogen oral therapy   Letrozole 2.5 mg (stopped for arthralgias and myalgias and fatigue accompanied with hair loss)   01/01/2020 Relapse/Recurrence   Screening mammogram detected a right breast mass. Diagnostic mammogram showed a 4.2cm mass at the 11 o'clock position in the right breast. Biopsy showed invasive mammary carcinoma, grade 2, HER-2 + (3+), ER+ 95%, PR+ 90%, Ki67 20%.    02/10/2020 Surgery   Right lumpectomy Ninfa Linden): mammary carcinoma, grade 2, 3.3cm, clear margins.   Non-small cell cancer of right lung ([place])  10/20/2017 PET scan   6 cm central left lower lobe pulmonary mass is markedly hypermetabolic with SUV max = 40.9 and extends into the left hilum.  The sub solid 2.4 cm pulmonary nodule in the right middle lobe also shows FDG accumulation with SUV max = 1.9. No evidence for hypermetabolic mediastinal or right hilar lymphadenopathy. L2 uptake degenerative.  LLL:T3N0M0 stage IIb clinical stage; RML: T2N0  Stage 1B   10/20/2017 Imaging   MRI brain: No metastatic disease, right inferior parietal convexity meningioma 2.9 x 2.7 x 1.4 cm.  This indents the brain and associated with mild brain edema   11/01/2017 Initial Diagnosis   Transbronchial needle aspiration right middle lobe and left lower lobe brushings: Both are positive for malignant cells consistent with non-small cell lung cancer    11/29/2017 - 01/18/2018 Radiation Therapy   Radiation   12/01/2017 Pathology Results   Foundation 1:TPS score: 5%; MS-Stable, TMG High, AKT2 Amp, RB1, TP 53 (no mutations noted in EGFR, K-ras, Al, BRAF, RET, ERBB2, Ros 1)     CHIEF COMPLIANT: Follow-up s/p lumpectomy to review pathology  INTERVAL HISTORY: [name] French is a 86 y.o. with above-mentioned history of recurrent right breast cancer. She underwent a right lumpectomy on 02/10/20 with Dr. Ninfa Linden for which pathology showed mammary carcinoma, grade 2, 3.3cm, clear margins. She presents to the clinic today to review the pathology and discuss further treatment.  ALLERGIES:  is allergic to other; codeine; hydrocodone; and neosporin [neomycin-bacitracin zn-polymyx].  MEDICATIONS:  Current Outpatient Medications  Medication Sig Dispense Refill  . acetaminophen (TYLENOL) 500 MG tablet Take 500 mg by mouth every 6 (six) hours as needed for moderate pain or headache.    . Artificial Tear Solution (BION TEARS OP) Place 1-2 drops into both eyes as needed (dry eyes).     Marland Kitchen aspirin 81 MG tablet Take 81 mg by mouth daily.    . cetirizine (ZYRTEC) 10 MG tablet Take 10 mg daily by mouth.     . Cholecalciferol (VITAMIN D-3)  1000 UNITS CAPS Take 1,000 Units daily by mouth.     . doxylamine, Sleep, (UNISOM) 25 MG tablet Take 25 mg at bedtime by mouth.    Marland Kitchen Glucosamine-Chondroit-MSM-C-Mn CAPS Take 1 capsule by mouth daily.    Marland Kitchen ibuprofen (ADVIL) 200 MG tablet Take 200 mg by mouth every 6 (six) hours as needed for headache or moderate pain.    . Menthol, Topical  Analgesic, (BIOFREEZE EX) Apply 1 application topically daily as needed (leg pain).    . metoprolol tartrate (LOPRESSOR) 25 MG tablet TAKE ONE TABLET TWICE DAILY (Patient taking differently: Take 12.5 mg by mouth 2 (two) times daily. ) 180 tablet 3  . naphazoline-pheniramine (NAPHCON-A) 0.025-0.3 % ophthalmic solution Place 1-2 drops into both eyes as needed for eye irritation or allergies.     . nitroGLYCERIN (NITROSTAT) 0.4 MG SL tablet Place 1 tablet (0.4 mg total) under the tongue every 5 (five) minutes as needed for chest pain. 90 tablet 3  . Polyethyl Glycol-Propyl Glycol (SYSTANE OP) Place 1 drop into both eyes daily as needed (dry eyes).    . pravastatin (PRAVACHOL) 40 MG tablet Take 40 mg daily by mouth.     . Probiotic Product (ALIGN) 4 MG CAPS Take 4 mg daily by mouth.    . SUPER B COMPLEX & C TABS Take 1 tablet by mouth daily.  0  . traMADol (ULTRAM) 50 MG tablet Take 1 tablet (50 mg total) by mouth every 6 (six) hours as needed for moderate pain or severe pain. 20 tablet 0  . zolpidem (AMBIEN) 5 MG tablet Take 2.5 mg by mouth at bedtime as needed for sleep.   2   Current Facility-Administered Medications  Medication Dose Route Frequency Provider Last Rate Last Admin  . 0.9 %  sodium chloride infusion  500 mL Intravenous Once Ladene Artist, MD        PHYSICAL EXAMINATION: ECOG PERFORMANCE STATUS: 1 - Symptomatic but completely ambulatory  There were no vitals filed for this visit. There were no vitals filed for this visit.  LABORATORY DATA:  I have reviewed the data as listed CMP Latest Ref Rng & Units 02/10/2020 01/17/2020 07/16/2019  Glucose 70 - 99 mg/dL 136(H) - -  BUN 8 - 23 mg/dL '11 11 14  '$ Creatinine 0.44 - 1.00 mg/dL 0.75 0.74 0.78  Sodium 135 - 145 mmol/L 138 - -  Potassium 3.5 - 5.1 mmol/L 4.0 - -  Chloride 98 - 111 mmol/L 100 - -  CO2 22 - 32 mmol/L 25 - -  Calcium 8.9 - 10.3 mg/dL 9.6 - -  Total Protein 6.5 - 8.1 g/dL - - -  Total Bilirubin 0.3 - 1.2 mg/dL - -  -  Alkaline Phos 38 - 126 U/L - - -  AST 15 - 41 U/L - - -  ALT 14 - 54 U/L - - -    Lab Results  Component Value Date   WBC 5.9 02/10/2020   HGB 13.5 02/10/2020   HCT 43.0 02/10/2020   MCV 86.3 02/10/2020   PLT 227 02/10/2020   NEUTROABS 5.3 02/06/2018    ASSESSMENT & PLAN:  Breast cancer of upper-inner quadrant of right female breast 12/23/2019: Suspicious 4.2 cm mass in the right breast, no abnormal right axillary lymph nodes 01/03/2020:Right breast biopsy 11 o'clock position: Grade 2 invasive lobular carcinoma with LCIS ER 95%, PR 90%, Ki-67 20%, HER-2 3+ positive  CT CAP 01/17/20: Masslike consolidation following radiation measuring 6.8 x 3.6 cm as  compared to 7.2 by 4.2 cm. Pleural thickening approximately 6 mm . No metastatic disease  02/10/2020:Right lumpectomy Ninfa Linden): mammary carcinoma, grade 2, 3.3cm, clear margins.  Treatment plan: Adjuvant anastrozole with Herceptin subcutaneously for a year Patient will start anastrozole at half a tablet daily for 2 weeks and then increase it to full tablet if she tolerates it.  She will start this in 2 weeks.  We discussed the pros and cons of Herceptin.  She is willing to consider this.  She will need an echocardiogram.  We will plan to start this end of March.  I will see her every other Herceptin treatment for follow-ups. She understands fully well that if she does not tolerate the treatment then she will discontinue it.  Lung cancer: Patient will need surveillance for that as well.    No orders of the defined types were placed in this encounter.  The patient has a good understanding of the overall plan. she agrees with it. she will call with any problems that may develop before the next visit here.  Total time spent: 30 mins including face to face time and time spent for planning, charting and coordination of care  Nicholas Lose, MD 02/14/2020  I, Cloyde Reams Dorshimer, am acting as scribe for Dr. Nicholas Lose.  I have  reviewed the above documentation for accuracy and completeness, and I agree with the above.

## 2020-02-14 ENCOUNTER — Inpatient Hospital Stay (HOSPITAL_BASED_OUTPATIENT_CLINIC_OR_DEPARTMENT_OTHER): Payer: Medicare Other | Admitting: Hematology and Oncology

## 2020-02-14 ENCOUNTER — Other Ambulatory Visit: Payer: Self-pay

## 2020-02-14 VITALS — BP 104/57 | HR 79 | Temp 98.7°F | Resp 17 | Ht 65.0 in | Wt 130.4 lb

## 2020-02-14 DIAGNOSIS — C3432 Malignant neoplasm of lower lobe, left bronchus or lung: Secondary | ICD-10-CM | POA: Diagnosis not present

## 2020-02-14 DIAGNOSIS — I159 Secondary hypertension, unspecified: Secondary | ICD-10-CM | POA: Diagnosis not present

## 2020-02-14 DIAGNOSIS — C50211 Malignant neoplasm of upper-inner quadrant of right female breast: Secondary | ICD-10-CM | POA: Diagnosis not present

## 2020-02-14 DIAGNOSIS — Z853 Personal history of malignant neoplasm of breast: Secondary | ICD-10-CM | POA: Diagnosis not present

## 2020-02-14 DIAGNOSIS — Z79899 Other long term (current) drug therapy: Secondary | ICD-10-CM | POA: Diagnosis not present

## 2020-02-14 DIAGNOSIS — Z791 Long term (current) use of non-steroidal anti-inflammatories (NSAID): Secondary | ICD-10-CM | POA: Diagnosis not present

## 2020-02-14 DIAGNOSIS — Z7982 Long term (current) use of aspirin: Secondary | ICD-10-CM | POA: Diagnosis not present

## 2020-02-14 DIAGNOSIS — Z170 Estrogen receptor positive status [ER+]: Secondary | ICD-10-CM

## 2020-02-14 DIAGNOSIS — Z923 Personal history of irradiation: Secondary | ICD-10-CM | POA: Diagnosis not present

## 2020-02-14 MED ORDER — ANASTROZOLE 1 MG PO TABS: 1.0000 mg | ORAL_TABLET | Freq: Every day | ORAL | 3 refills | Status: DC

## 2020-02-14 NOTE — Assessment & Plan Note (Signed)
12/23/2019: Suspicious 4.2 cm mass in the right breast, no abnormal right axillary lymph nodes 01/03/2020:Right breast biopsy 11 o'clock position: Grade 2 invasive lobular carcinoma with LCIS ER 95%, PR 90%, Ki-67 20%, HER-2 3+ positive  CT CAP 01/17/20: Masslike consolidation following radiation measuring 6.8 x 3.6 cm as compared to 7.2 by 4.2 cm. Pleural thickening approximately 6 mm . No metastatic disease  02/10/2020:Right lumpectomy Ninfa Linden): mammary carcinoma, grade 2, 3.3cm, clear margins.  Treatment plan: Adjuvant anastrozole with Herceptin subcutaneously for a year

## 2020-02-17 ENCOUNTER — Telehealth: Payer: Self-pay | Admitting: Hematology and Oncology

## 2020-02-17 NOTE — Telephone Encounter (Signed)
I talk with patient regarding schedule  

## 2020-02-21 ENCOUNTER — Telehealth: Payer: Self-pay | Admitting: *Deleted

## 2020-02-21 NOTE — Telephone Encounter (Signed)
Received VM from pt, attempt x1 to return call, no answer, LVM to return call to the office.

## 2020-02-24 ENCOUNTER — Other Ambulatory Visit: Payer: Self-pay

## 2020-02-24 ENCOUNTER — Ambulatory Visit (HOSPITAL_COMMUNITY)
Admission: RE | Admit: 2020-02-24 | Discharge: 2020-02-24 | Disposition: A | Discharge status: Home / Self Care | Payer: Medicare Other | Source: Ambulatory Visit | Attending: Hematology and Oncology | Admitting: Hematology and Oncology

## 2020-02-24 DIAGNOSIS — Z87891 Personal history of nicotine dependence: Secondary | ICD-10-CM | POA: Insufficient documentation

## 2020-02-24 DIAGNOSIS — I080 Rheumatic disorders of both mitral and aortic valves: Secondary | ICD-10-CM | POA: Insufficient documentation

## 2020-02-24 DIAGNOSIS — I159 Secondary hypertension, unspecified: Secondary | ICD-10-CM | POA: Insufficient documentation

## 2020-02-24 DIAGNOSIS — I313 Pericardial effusion (noninflammatory): Secondary | ICD-10-CM | POA: Diagnosis not present

## 2020-02-24 DIAGNOSIS — C50919 Malignant neoplasm of unspecified site of unspecified female breast: Secondary | ICD-10-CM | POA: Diagnosis not present

## 2020-02-24 NOTE — Progress Notes (Signed)
  Echocardiogram 2D Echocardiogram has been performed.  [name] French 02/24/2020, 1:45 PM

## 2020-03-04 ENCOUNTER — Other Ambulatory Visit: Payer: Self-pay | Admitting: Hematology and Oncology

## 2020-03-04 DIAGNOSIS — C50211 Malignant neoplasm of upper-inner quadrant of right female breast: Secondary | ICD-10-CM

## 2020-03-04 DIAGNOSIS — Z170 Estrogen receptor positive status [ER+]: Secondary | ICD-10-CM

## 2020-03-04 NOTE — Progress Notes (Signed)
Pharmacist Chemotherapy Monitoring - Initial Assessment    Anticipated start date: 03/05/20   Regimen:  . Are orders appropriate based on the patient's diagnosis, regimen, and cycle? Yes . Does the plan date match the patient's scheduled date? Yes . Is the sequencing of drugs appropriate? Yes . Are the premedications appropriate for the patient's regimen? Yes . Prior Authorization for treatment is: Approved - confirmed with Velna Hatchet o If applicable, is the correct biosimilar selected based on the patient's insurance? not applicable  Organ Function and Labs: Marland Kitchen Are dose adjustments needed based on the patient's renal function, hepatic function, or hematologic function? No . Other organ system assessment, if indicated: trastuzumab: Echo/ MUGA  Dose Assessment: . Are the drug doses appropriate? Yes . Are the following correct: o Drug concentrations Yes o IV fluid compatible with drug Yes o Administration routes Yes o Timing of therapy Yes . If applicable, does the patient have documented access for treatment and/or plans for port-a-cath placement? not applicable . If applicable, have lifetime cumulative doses been properly documented and assessed? not applicable Lifetime Dose Tracking  No doses have been documented on this patient for the following tracked chemicals: Doxorubicin, Epirubicin, Idarubicin, Daunorubicin, Mitoxantrone, Bleomycin, Oxaliplatin, Carboplatin, Liposomal Doxorubicin  o   Toxicity Monitoring/Prevention: . The patient has the following take home antiemetics prescribed: N/A . The patient has the following take home medications prescribed: N/A . Medication allergies and previous infusion related reactions, if applicable, have been reviewed and addressed. Yes . The patient's current medication list has been assessed for drug-drug interactions with their chemotherapy regimen. no significant drug-drug interactions were identified on review.  Order Review: . Are the  treatment plan orders signed? No, message sent to MD to review and sign . Is the patient scheduled to see a provider prior to their treatment? No  I verify that I have reviewed each item in the above checklist and answered each question accordingly.  Norwood Levo [facility] 03/04/2020 12:01 PM

## 2020-03-05 ENCOUNTER — Other Ambulatory Visit: Payer: Self-pay

## 2020-03-05 ENCOUNTER — Inpatient Hospital Stay: Payer: Medicare Other | Attending: Hematology and Oncology

## 2020-03-05 VITALS — BP 129/77 | HR 100 | Temp 98.2°F | Resp 18 | Wt 132.0 lb

## 2020-03-05 DIAGNOSIS — C50211 Malignant neoplasm of upper-inner quadrant of right female breast: Secondary | ICD-10-CM

## 2020-03-05 DIAGNOSIS — C3432 Malignant neoplasm of lower lobe, left bronchus or lung: Secondary | ICD-10-CM | POA: Insufficient documentation

## 2020-03-05 DIAGNOSIS — Z5112 Encounter for antineoplastic immunotherapy: Secondary | ICD-10-CM | POA: Diagnosis not present

## 2020-03-05 MED ORDER — TRASTUZUMAB-HYALURONIDASE-OYSK 600-10000 MG-UNT/5ML ~~LOC~~ SOLN
600.0000 mg | Freq: Once | SUBCUTANEOUS | Status: AC
  Administered 2020-03-05: 600 mg via SUBCUTANEOUS
  Filled 2020-03-05: qty 5

## 2020-03-05 MED ORDER — ACETAMINOPHEN 325 MG PO TABS
650.0000 mg | ORAL_TABLET | Freq: Once | ORAL | Status: AC
  Administered 2020-03-05: 650 mg via ORAL

## 2020-03-05 MED ORDER — DIPHENHYDRAMINE HCL 25 MG PO CAPS
ORAL_CAPSULE | ORAL | Status: AC
  Filled 2020-03-05: qty 1

## 2020-03-05 MED ORDER — ACETAMINOPHEN 325 MG PO TABS
ORAL_TABLET | ORAL | Status: AC
  Filled 2020-03-05: qty 2

## 2020-03-05 MED ORDER — DIPHENHYDRAMINE HCL 25 MG PO CAPS
25.0000 mg | ORAL_CAPSULE | Freq: Once | ORAL | Status: AC
  Administered 2020-03-05: 25 mg via ORAL

## 2020-03-05 NOTE — Patient Instructions (Signed)
[facility] Discharge Instructions for Patients Receiving Chemotherapy  Today you received the following chemotherapy agents Trastuzumab-hyaluronidase-oysk (HERCEPTIN HYLECTA).  To help prevent nausea and vomiting after your treatment, we encourage you to take your nausea medication as prescribed.   If you develop nausea and vomiting that is not controlled by your nausea medication, call the clinic.   BELOW ARE SYMPTOMS THAT SHOULD BE REPORTED IMMEDIATELY:  *FEVER GREATER THAN 100.5 F  *CHILLS WITH OR WITHOUT FEVER  NAUSEA AND VOMITING THAT IS NOT CONTROLLED WITH YOUR NAUSEA MEDICATION  *UNUSUAL SHORTNESS OF BREATH  *UNUSUAL BRUISING OR BLEEDING  TENDERNESS IN MOUTH AND THROAT WITH OR WITHOUT PRESENCE OF ULCERS  *URINARY PROBLEMS  *BOWEL PROBLEMS  UNUSUAL RASH Items with * indicate a potential emergency and should be followed up as soon as possible.  Feel free to call the clinic should you have any questions or concerns. The clinic phone number is [phone].  Please show the [place] at check-in to the Emergency Department and triage nurse.  Trastuzumab; Hyaluronidase injection What is this medicine? TRASTUZUMAB; HYALURONIDASE (tras TOO zoo mab / hye al ur ON i dase) is used to treat breast cancer and stomach cancer. Trastuzumab is a monoclonal antibody. Hyaluronidase is used to improve the effects of trastuzumab. This medicine may be used for other purposes; ask your health care provider or pharmacist if you have questions. COMMON BRAND NAME(S): HERCEPTIN HYLECTA What should I tell my health care provider before I take this medicine? They need to know if you have any of these conditions:  heart disease  heart failure  lung or breathing disease, like asthma  an unusual or allergic reaction to trastuzumab, or other medications, foods, dyes, or preservatives  pregnant or trying to get pregnant  breast-feeding How should I use this  medicine? This medicine is for injection under the skin. It is given by a health care professional in a hospital or clinic setting. Talk to your pediatrician regarding the use of this medicine in children. This medicine is not approved for use in children. Overdosage: If you think you have taken too much of this medicine contact a poison control center or emergency room at once. NOTE: This medicine is only for you. Do not share this medicine with others. What if I miss a dose? It is important not to miss a dose. Call your doctor or health care professional if you are unable to keep an appointment. What may interact with this medicine? This medicine may interact with the following medications:  certain types of chemotherapy, such as daunorubicin, doxorubicin, epirubicin, and idarubicin This list may not describe all possible interactions. Give your health care provider a list of all the medicines, herbs, non-prescription drugs, or dietary supplements you use. Also tell them if you smoke, drink alcohol, or use illegal drugs. Some items may interact with your medicine. What should I watch for while using this medicine? Visit your doctor for checks on your progress. Report any side effects. Continue your course of treatment even though you feel ill unless your doctor tells you to stop. Call your doctor or health care professional for advice if you get a fever, chills or sore throat, or other symptoms of a cold or flu. Do not treat yourself. Try to avoid being around people who are sick. You may experience fever, chills and shaking during your first infusion. These effects are usually mild and can be treated with other medicines. Report any side effects during the  infusion to your health care professional. Fever and chills usually do not happen with later infusions. Do not become pregnant while taking this medicine or for 7 months after stopping it. Women should inform their doctor if they wish to become  pregnant or think they might be pregnant. Women of child-bearing potential will need to have a negative pregnancy test before starting this medicine. There is a potential for serious side effects to an unborn child. Talk to your health care professional or pharmacist for more information. Do not breast-feed an infant while taking this medicine or for 7 months after stopping it. What side effects may I notice from receiving this medicine? Side effects that you should report to your doctor or health care professional as soon as possible:  allergic reactions like skin rash, itching or hives, swelling of the face, lips, or tongue  breathing problems  chest pain or palpitations  cough  fever  general ill feeling or flu-like symptoms  signs of worsening heart failure like breathing problems; swelling in your legs and feet Side effects that usually do not require medical attention (report these to your doctor or health care professional if they continue or are bothersome):  bone pain  changes in taste  diarrhea  joint pain  nausea/vomiting  unusually weak or tired  weight loss This list may not describe all possible side effects. Call your doctor for medical advice about side effects. You may report side effects to FDA at 1-800-FDA-1088. Where should I keep my medicine? This drug is given in a hospital or clinic and will not be stored at home. NOTE: This sheet is a summary. It may not cover all possible information. If you have questions about this medicine, talk to your doctor, pharmacist, or health care provider.  2020 Elsevier/Gold Standard (2018-02-23 21:54:17)  Coronavirus (COVID-19) Are you at risk?  Are you at risk for the Coronavirus (COVID-19)?  To be considered HIGH RISK for Coronavirus (COVID-19), you have to meet the following criteria:  . Traveled to [place], [place], [place], [place] or [place]; or in the [place] to [place], [place], [place], or [place]; and have fever, cough, and shortness of breath within the last 2 weeks of travel OR . Been in close contact with a person diagnosed with COVID-19 within the last 2 weeks and have fever, cough, and shortness of breath . IF YOU DO NOT MEET THESE CRITERIA, YOU ARE CONSIDERED LOW RISK FOR COVID-19.  What to do if you are HIGH RISK for COVID-19?  Marland Kitchen If you are having a medical emergency, call 911. . Seek medical care right away. Before you go to a doctor's office, urgent care or emergency department, call ahead and tell them about your recent travel, contact with someone diagnosed with COVID-19, and your symptoms. You should receive instructions from your physician's office regarding next steps of care.  . When you arrive at healthcare provider, tell the healthcare staff immediately you have returned from visiting [place], [place], [place], [place] or [place]; or traveled in the [place] to [place], [place], [place], or [place]; in the last two weeks or you have been in close contact with a person diagnosed with COVID-19 in the last 2 weeks.   . Tell the health care staff about your symptoms: fever, cough and shortness of breath. . After you have been seen by a medical provider, you will be either: o Tested for (COVID-19) and discharged home on quarantine except to  seek medical care if symptoms worsen, and asked to  - Stay home and avoid contact with others until you get your results (4-5 days)  - Avoid travel on public transportation if possible (such as bus, train, or airplane) or o Sent to the Emergency Department by EMS for evaluation, COVID-19 testing, and possible admission depending on your condition and test results.  What to do if you are LOW RISK for COVID-19?  Reduce your risk of any infection by using the same precautions used for avoiding the common cold or flu:  Marland Kitchen Wash your hands often with soap and warm water for at least 20 seconds.  If soap and water are not readily  available, use an alcohol-based hand sanitizer with at least 60% alcohol.  . If coughing or sneezing, cover your mouth and nose by coughing or sneezing into the elbow areas of your shirt or coat, into a tissue or into your sleeve (not your hands). . Avoid shaking hands with others and consider head nods or verbal greetings only. . Avoid touching your eyes, nose, or mouth with unwashed hands.  . Avoid close contact with people who are sick. . Avoid places or events with large numbers of people in one location, like concerts or sporting events. . Carefully consider travel plans you have or are making. . If you are planning any travel outside or inside the [place], visit the CDC's Travelers' Health webpage for the latest health notices. . If you have some symptoms but not all symptoms, continue to monitor at home and seek medical attention if your symptoms worsen. . If you are having a medical emergency, call 911.   [place] / e-Visit: [email or web address]         [facility]: [facility]: [phone]                   [facility]: [phone]

## 2020-03-20 NOTE — Progress Notes (Signed)

## 2020-03-25 NOTE — Progress Notes (Signed)
Patient Care Team: Tisovec, Fransico Him, MD as PCP - General (Internal Medicine) Martinique, Peter M, MD as PCP - Cardiology (Cardiology) Marcy Panning, MD as Consulting Physician (Oncology)  DIAGNOSIS:    ICD-10-CM   1. Malignant neoplasm of upper-inner quadrant of right breast in female, estrogen receptor positive ([place])  C50.211    Z17.0     SUMMARY OF ONCOLOGIC HISTORY: Oncology History  Malignant neoplasm of upper-inner quadrant of right breast in female, estrogen receptor positive ([place])  05/08/2013 Initial Biopsy   Right: grade 1-2 invasive mammary carcinoma ER positive PR positive HER-2/neu negative with Ki-67 30% (MRI 2.8 cm)second smaller mass together 3.8cm   05/11/2013 - 11/04/2013 Anti-estrogen oral therapy   Letrozole 2.5 mg Neoadjuvant anti-estrogen therapy   11/06/2013 Surgery   Bilateral Lumpectomies: Left: sclerosing lesion with ALH fibrocystic changes with microcalcifications. Right: Grade 1 ILC 2.7 cm with LCIS    Radiation Therapy   Patient declined   11/19/2013 - 06/09/2015 Anti-estrogen oral therapy   Letrozole 2.5 mg (stopped for arthralgias and myalgias and fatigue accompanied with hair loss)   01/01/2020 Relapse/Recurrence   Screening mammogram detected a right breast mass. Diagnostic mammogram showed a 4.2cm mass at the 11 o'clock position in the right breast. Biopsy showed invasive mammary carcinoma, grade 2, HER-2 + (3+), ER+ 95%, PR+ 90%, Ki67 20%.    02/10/2020 Surgery   Right lumpectomy Ninfa Linden): mammary carcinoma, grade 2, 3.3cm, clear margins.   03/05/2020 -  Chemotherapy   The patient had trastuzumab-hyaluronidase-oysk (HERCEPTIN HYLECTA) 600-10000 MG-UNT/5ML chemo SQ injection 600 mg, 600 mg, Subcutaneous,  Once, 1 of 5 cycles Administration: 600 mg (03/05/2020)  for chemotherapy treatment.    Non-small cell cancer of right lung ([place])  10/20/2017 PET scan   6 cm central left lower lobe pulmonary mass is markedly hypermetabolic with SUV max = 96.7 and  extends into the left hilum.  The sub solid 2.4 cm pulmonary nodule in the right middle lobe also shows FDG accumulation with SUV max = 1.9. No evidence for hypermetabolic mediastinal or right hilar lymphadenopathy. L2 uptake degenerative.  LLL:T3N0M0 stage IIb clinical stage; RML: T2N0 Stage 1B   10/20/2017 Imaging   MRI brain: No metastatic disease, right inferior parietal convexity meningioma 2.9 x 2.7 x 1.4 cm.  This indents the brain and associated with mild brain edema   11/01/2017 Initial Diagnosis   Transbronchial needle aspiration right middle lobe and left lower lobe brushings: Both are positive for malignant cells consistent with non-small cell lung cancer    11/29/2017 - 01/18/2018 Radiation Therapy   Radiation   12/01/2017 Pathology Results   Foundation 1:TPS score: 5%; MS-Stable, TMG High, AKT2 Amp, RB1, TP 53 (no mutations noted in EGFR, K-ras, Al, BRAF, RET, ERBB2, Ros 1)     CHIEF COMPLIANT: Follow-up of recurrent right breast cancer on Herceptin mainenance  INTERVAL HISTORY: [name] French is a 86 y.o. with above-mentioned history of recurrent right breast cancer treated with lumpectomy and who is currently on Herceptin maintenance. Echo on 02/24/20 showed an ejection fraction of 65%. She presents to the clinic today for treatment.    ALLERGIES:  is allergic to other; codeine; hydrocodone; and neosporin [neomycin-bacitracin zn-polymyx].  MEDICATIONS:  Current Outpatient Medications  Medication Sig Dispense Refill  . acetaminophen (TYLENOL) 500 MG tablet Take 500 mg by mouth every 6 (six) hours as needed for moderate pain or headache.    . anastrozole (ARIMIDEX) 1 MG tablet Take 1 tablet (1 mg total) by mouth daily.  90 tablet 3  . Artificial Tear Solution (BION TEARS OP) Place 1-2 drops into both eyes as needed (dry eyes).     Marland Kitchen aspirin 81 MG tablet Take 81 mg by mouth daily.    . cetirizine (ZYRTEC) 10 MG tablet Take 10 mg daily by mouth.     . Cholecalciferol  (VITAMIN D-3) 1000 UNITS CAPS Take 1,000 Units daily by mouth.     . doxylamine, Sleep, (UNISOM) 25 MG tablet Take 25 mg at bedtime by mouth.    Marland Kitchen Glucosamine-Chondroit-MSM-C-Mn CAPS Take 1 capsule by mouth daily.    Marland Kitchen ibuprofen (ADVIL) 200 MG tablet Take 200 mg by mouth every 6 (six) hours as needed for headache or moderate pain.    . Menthol, Topical Analgesic, (BIOFREEZE EX) Apply 1 application topically daily as needed (leg pain).    . metoprolol tartrate (LOPRESSOR) 25 MG tablet TAKE ONE TABLET TWICE DAILY (Patient taking differently: Take 12.5 mg by mouth 2 (two) times daily. ) 180 tablet 3  . naphazoline-pheniramine (NAPHCON-A) 0.025-0.3 % ophthalmic solution Place 1-2 drops into both eyes as needed for eye irritation or allergies.     . nitroGLYCERIN (NITROSTAT) 0.4 MG SL tablet Place 1 tablet (0.4 mg total) under the tongue every 5 (five) minutes as needed for chest pain. 90 tablet 3  . Polyethyl Glycol-Propyl Glycol (SYSTANE OP) Place 1 drop into both eyes daily as needed (dry eyes).    . pravastatin (PRAVACHOL) 40 MG tablet Take 40 mg daily by mouth.     . Probiotic Product (ALIGN) 4 MG CAPS Take 4 mg daily by mouth.    . SUPER B COMPLEX & C TABS Take 1 tablet by mouth daily.  0  . traMADol (ULTRAM) 50 MG tablet Take 1 tablet (50 mg total) by mouth every 6 (six) hours as needed for moderate pain or severe pain. 20 tablet 0  . zolpidem (AMBIEN) 5 MG tablet Take 2.5 mg by mouth at bedtime as needed for sleep.   2   Current Facility-Administered Medications  Medication Dose Route Frequency Provider Last Rate Last Admin  . 0.9 %  sodium chloride infusion  500 mL Intravenous Once Ladene Artist, MD        PHYSICAL EXAMINATION: ECOG PERFORMANCE STATUS: 1 - Symptomatic but completely ambulatory  There were no vitals filed for this visit. There were no vitals filed for this visit.  LABORATORY DATA:  I have reviewed the data as listed CMP Latest Ref Rng & Units 02/10/2020 01/17/2020  07/16/2019  Glucose 70 - 99 mg/dL 136(H) - -  BUN 8 - 23 mg/dL '11 11 14  '$ Creatinine 0.44 - 1.00 mg/dL 0.75 0.74 0.78  Sodium 135 - 145 mmol/L 138 - -  Potassium 3.5 - 5.1 mmol/L 4.0 - -  Chloride 98 - 111 mmol/L 100 - -  CO2 22 - 32 mmol/L 25 - -  Calcium 8.9 - 10.3 mg/dL 9.6 - -  Total Protein 6.5 - 8.1 g/dL - - -  Total Bilirubin 0.3 - 1.2 mg/dL - - -  Alkaline Phos 38 - 126 U/L - - -  AST 15 - 41 U/L - - -  ALT 14 - 54 U/L - - -    Lab Results  Component Value Date   WBC 5.9 02/10/2020   HGB 13.5 02/10/2020   HCT 43.0 02/10/2020   MCV 86.3 02/10/2020   PLT 227 02/10/2020   NEUTROABS 5.3 02/06/2018    ASSESSMENT & PLAN:  Malignant  neoplasm of upper-inner quadrant of right breast in female, estrogen receptor positive ([place]) 12/23/2019: Suspicious 4.2 cm mass in the right breast, no abnormal right axillary lymph nodes 01/03/2020:Right breast biopsy 11 o'clock position: Grade 2 invasive lobular carcinoma with LCIS ER 95%, PR 90%, Ki-67 20%, HER-2 3+ positive  CT CAP 01/17/20:Masslike consolidation following radiation measuring 6.8 x 3.6 cm as compared to 7.2 by 4.2 cm. Pleural thickening approximately 6 mm. No metastatic disease  02/10/2020:Right lumpectomy Ninfa Linden): mammary carcinoma, grade 2, 3.3cm, clear margins.  Treatment plan: Adjuvant anastrozole with Herceptin subcutaneously for a year ------------------------------------------------------------------------------------------------------------------------------------------ Current treatment: Herceptin subcutaneous cycle 2 Anastrozole toxicities:  1.  Occasional loose stools: She took Imodium 2.  Chills after the infusion  so far she appears to be tolerating Herceptin and anastrozole very well. Return to clinic every 3 weeks for Herceptin every 6 weeks of follow-up with me. There is no need to perform lab work since he does not affect Herceptin dosing.     No orders of the defined types were placed in this  encounter.  The patient has a good understanding of the overall plan. she agrees with it. she will call with any problems that may develop before the next visit here.  Total time spent: 30 mins including face to face time and time spent for planning, charting and coordination of care  Nicholas Lose, MD 03/26/2020  I, Cloyde Reams Dorshimer, am acting as scribe for Dr. Nicholas Lose.  I have reviewed the above documentation for accuracy and completeness, and I agree with the above.

## 2020-03-26 ENCOUNTER — Inpatient Hospital Stay: Payer: Medicare Other | Attending: Hematology and Oncology | Admitting: Hematology and Oncology

## 2020-03-26 ENCOUNTER — Inpatient Hospital Stay: Payer: Medicare Other

## 2020-03-26 ENCOUNTER — Other Ambulatory Visit: Payer: Self-pay

## 2020-03-26 DIAGNOSIS — Z7982 Long term (current) use of aspirin: Secondary | ICD-10-CM | POA: Diagnosis not present

## 2020-03-26 DIAGNOSIS — Z85118 Personal history of other malignant neoplasm of bronchus and lung: Secondary | ICD-10-CM | POA: Diagnosis not present

## 2020-03-26 DIAGNOSIS — Z9221 Personal history of antineoplastic chemotherapy: Secondary | ICD-10-CM | POA: Diagnosis not present

## 2020-03-26 DIAGNOSIS — Z79899 Other long term (current) drug therapy: Secondary | ICD-10-CM | POA: Diagnosis not present

## 2020-03-26 DIAGNOSIS — Z5112 Encounter for antineoplastic immunotherapy: Secondary | ICD-10-CM | POA: Insufficient documentation

## 2020-03-26 DIAGNOSIS — C50211 Malignant neoplasm of upper-inner quadrant of right female breast: Secondary | ICD-10-CM

## 2020-03-26 DIAGNOSIS — Z170 Estrogen receptor positive status [ER+]: Secondary | ICD-10-CM

## 2020-03-26 DIAGNOSIS — Z923 Personal history of irradiation: Secondary | ICD-10-CM | POA: Insufficient documentation

## 2020-03-26 DIAGNOSIS — Z79811 Long term (current) use of aromatase inhibitors: Secondary | ICD-10-CM | POA: Diagnosis not present

## 2020-03-26 MED ORDER — TRASTUZUMAB-HYALURONIDASE-OYSK 600-10000 MG-UNT/5ML ~~LOC~~ SOLN
600.0000 mg | Freq: Once | SUBCUTANEOUS | Status: AC
  Administered 2020-03-26: 600 mg via SUBCUTANEOUS
  Filled 2020-03-26: qty 5

## 2020-03-26 MED ORDER — ACETAMINOPHEN 325 MG PO TABS
650.0000 mg | ORAL_TABLET | Freq: Once | ORAL | Status: AC
  Administered 2020-03-26: 650 mg via ORAL

## 2020-03-26 MED ORDER — DIPHENHYDRAMINE HCL 25 MG PO CAPS
ORAL_CAPSULE | ORAL | Status: AC
  Filled 2020-03-26: qty 1

## 2020-03-26 MED ORDER — DIPHENHYDRAMINE HCL 25 MG PO CAPS
25.0000 mg | ORAL_CAPSULE | Freq: Once | ORAL | Status: AC
  Administered 2020-03-26: 13:00:00 25 mg via ORAL

## 2020-03-26 MED ORDER — ACETAMINOPHEN 325 MG PO TABS
ORAL_TABLET | ORAL | Status: AC
  Filled 2020-03-26: qty 2

## 2020-03-26 NOTE — Patient Instructions (Signed)
Trastuzumab; Hyaluronidase injection What is this medicine? TRASTUZUMAB; HYALURONIDASE (tras TOO zoo mab / hye al ur ON i dase) is used to treat breast cancer and stomach cancer. Trastuzumab is a monoclonal antibody. Hyaluronidase is used to improve the effects of trastuzumab. This medicine may be used for other purposes; ask your health care provider or pharmacist if you have questions. COMMON BRAND NAME(S): HERCEPTIN HYLECTA What should I tell my health care provider before I take this medicine? They need to know if you have any of these conditions:  heart disease  heart failure  lung or breathing disease, like asthma  an unusual or allergic reaction to trastuzumab, or other medications, foods, dyes, or preservatives  pregnant or trying to get pregnant  breast-feeding How should I use this medicine? This medicine is for injection under the skin. It is given by a health care professional in a hospital or clinic setting. Talk to your pediatrician regarding the use of this medicine in children. This medicine is not approved for use in children. Overdosage: If you think you have taken too much of this medicine contact a poison control center or emergency room at once. NOTE: This medicine is only for you. Do not share this medicine with others. What if I miss a dose? It is important not to miss a dose. Call your doctor or health care professional if you are unable to keep an appointment. What may interact with this medicine? This medicine may interact with the following medications:  certain types of chemotherapy, such as daunorubicin, doxorubicin, epirubicin, and idarubicin This list may not describe all possible interactions. Give your health care provider a list of all the medicines, herbs, non-prescription drugs, or dietary supplements you use. Also tell them if you smoke, drink alcohol, or use illegal drugs. Some items may interact with your medicine. What should I watch for while  using this medicine? Visit your doctor for checks on your progress. Report any side effects. Continue your course of treatment even though you feel ill unless your doctor tells you to stop. Call your doctor or health care professional for advice if you get a fever, chills or sore throat, or other symptoms of a cold or flu. Do not treat yourself. Try to avoid being around people who are sick. You may experience fever, chills and shaking during your first infusion. These effects are usually mild and can be treated with other medicines. Report any side effects during the infusion to your health care professional. Fever and chills usually do not happen with later infusions. Do not become pregnant while taking this medicine or for 7 months after stopping it. Women should inform their doctor if they wish to become pregnant or think they might be pregnant. Women of child-bearing potential will need to have a negative pregnancy test before starting this medicine. There is a potential for serious side effects to an unborn child. Talk to your health care professional or pharmacist for more information. Do not breast-feed an infant while taking this medicine or for 7 months after stopping it. What side effects may I notice from receiving this medicine? Side effects that you should report to your doctor or health care professional as soon as possible:  allergic reactions like skin rash, itching or hives, swelling of the face, lips, or tongue  breathing problems  chest pain or palpitations  cough  fever  general ill feeling or flu-like symptoms  signs of worsening heart failure like breathing problems; swelling in your legs and  feet Side effects that usually do not require medical attention (report these to your doctor or health care professional if they continue or are bothersome):  bone pain  changes in taste  diarrhea  joint pain  nausea/vomiting  unusually weak or tired  weight loss This  list may not describe all possible side effects. Call your doctor for medical advice about side effects. You may report side effects to FDA at 1-800-FDA-1088. Where should I keep my medicine? This drug is given in a hospital or clinic and will not be stored at home. NOTE: This sheet is a summary. It may not cover all possible information. If you have questions about this medicine, talk to your doctor, pharmacist, or health care provider.  2020 Elsevier/Gold Standard (2018-02-23 21:54:17)

## 2020-03-26 NOTE — Assessment & Plan Note (Addendum)
12/23/2019: Suspicious 4.2 cm mass in the right breast, no abnormal right axillary lymph nodes 01/03/2020:Right breast biopsy 11 o'clock position: Grade 2 invasive lobular carcinoma with LCIS ER 95%, PR 90%, Ki-67 20%, HER-2 3+ positive  CT CAP 01/17/20:Masslike consolidation following radiation measuring 6.8 x 3.6 cm as compared to 7.2 by 4.2 cm. Pleural thickening approximately 6 mm. No metastatic disease  02/10/2020:Right lumpectomy Ninfa Linden): mammary carcinoma, grade 2, 3.3cm, clear margins.  Treatment plan: Adjuvant anastrozole with Herceptin subcutaneously for a year ------------------------------------------------------------------------------------------------------------------------------------------ Current treatment: Herceptin subcutaneous cycle 1 Anastrozole toxicities:  If she does not tolerate Herceptin, then it will be discontinued. Return to clinic every 3 weeks for Herceptin every 6 weeks of follow-up with me.

## 2020-04-02 ENCOUNTER — Telehealth: Payer: Self-pay | Admitting: Hematology and Oncology

## 2020-04-02 NOTE — Telephone Encounter (Signed)
Scheduled per 04/08 los, patient has been called and voicemail was left. 

## 2020-04-08 ENCOUNTER — Other Ambulatory Visit: Payer: Self-pay

## 2020-04-08 ENCOUNTER — Telehealth: Payer: Self-pay

## 2020-04-08 DIAGNOSIS — Z170 Estrogen receptor positive status [ER+]: Secondary | ICD-10-CM

## 2020-04-08 DIAGNOSIS — C50211 Malignant neoplasm of upper-inner quadrant of right female breast: Secondary | ICD-10-CM

## 2020-04-08 NOTE — Telephone Encounter (Signed)
RN returned call, left voicemail for return call.

## 2020-04-10 DIAGNOSIS — E7800 Pure hypercholesterolemia, unspecified: Secondary | ICD-10-CM | POA: Diagnosis not present

## 2020-04-10 DIAGNOSIS — M8589 Other specified disorders of bone density and structure, multiple sites: Secondary | ICD-10-CM | POA: Diagnosis not present

## 2020-04-10 DIAGNOSIS — M859 Disorder of bone density and structure, unspecified: Secondary | ICD-10-CM | POA: Diagnosis not present

## 2020-04-10 NOTE — Progress Notes (Signed)
Pharmacist Chemotherapy Monitoring - Follow Up Assessment    I verify that I have reviewed each item in the below checklist:  . Regimen for the patient is scheduled for the appropriate day and plan matches scheduled date. Marland Kitchen Appropriate non-routine labs are ordered dependent on drug ordered. . If applicable, additional medications reviewed and ordered per protocol based on lifetime cumulative doses and/or treatment regimen.   Plan for follow-up and/or issues identified: No . I-vent associated with next due treatment: No . MD and/or nursing notified: No  Lafayette Dunlevy D 04/10/2020 3:58 PM

## 2020-04-16 ENCOUNTER — Ambulatory Visit: Payer: Medicare Other

## 2020-04-16 ENCOUNTER — Other Ambulatory Visit: Payer: Self-pay

## 2020-04-16 ENCOUNTER — Inpatient Hospital Stay: Payer: Medicare Other

## 2020-04-16 VITALS — BP 119/68 | HR 103 | Temp 98.3°F | Resp 18 | Wt 133.5 lb

## 2020-04-16 DIAGNOSIS — Z79811 Long term (current) use of aromatase inhibitors: Secondary | ICD-10-CM | POA: Diagnosis not present

## 2020-04-16 DIAGNOSIS — Z170 Estrogen receptor positive status [ER+]: Secondary | ICD-10-CM

## 2020-04-16 DIAGNOSIS — C50211 Malignant neoplasm of upper-inner quadrant of right female breast: Secondary | ICD-10-CM | POA: Diagnosis not present

## 2020-04-16 DIAGNOSIS — Z5112 Encounter for antineoplastic immunotherapy: Secondary | ICD-10-CM | POA: Diagnosis not present

## 2020-04-16 DIAGNOSIS — Z9221 Personal history of antineoplastic chemotherapy: Secondary | ICD-10-CM | POA: Diagnosis not present

## 2020-04-16 DIAGNOSIS — Z923 Personal history of irradiation: Secondary | ICD-10-CM | POA: Diagnosis not present

## 2020-04-16 MED ORDER — DIPHENHYDRAMINE HCL 25 MG PO CAPS
ORAL_CAPSULE | ORAL | Status: AC
  Filled 2020-04-16: qty 1

## 2020-04-16 MED ORDER — TRASTUZUMAB-HYALURONIDASE-OYSK 600-10000 MG-UNT/5ML ~~LOC~~ SOLN
600.0000 mg | Freq: Once | SUBCUTANEOUS | Status: AC
  Administered 2020-04-16: 600 mg via SUBCUTANEOUS
  Filled 2020-04-16: qty 5

## 2020-04-16 MED ORDER — ACETAMINOPHEN 325 MG PO TABS
650.0000 mg | ORAL_TABLET | Freq: Once | ORAL | Status: AC
  Administered 2020-04-16: 11:00:00 650 mg via ORAL

## 2020-04-16 MED ORDER — DIPHENHYDRAMINE HCL 25 MG PO CAPS
25.0000 mg | ORAL_CAPSULE | Freq: Once | ORAL | Status: AC
  Administered 2020-04-16: 25 mg via ORAL

## 2020-04-16 MED ORDER — ACETAMINOPHEN 325 MG PO TABS
ORAL_TABLET | ORAL | Status: AC
  Filled 2020-04-16: qty 2

## 2020-04-16 NOTE — Progress Notes (Signed)
Per Dr. Lindi Adie, okay for patient to receive treatment today with HR 103.

## 2020-04-16 NOTE — Patient Instructions (Signed)
Trastuzumab; Hyaluronidase injection What is this medicine? TRASTUZUMAB; HYALURONIDASE (tras TOO zoo mab / hye al ur ON i dase) is used to treat breast cancer and stomach cancer. Trastuzumab is a monoclonal antibody. Hyaluronidase is used to improve the effects of trastuzumab. This medicine may be used for other purposes; ask your health care provider or pharmacist if you have questions. COMMON BRAND NAME(S): HERCEPTIN HYLECTA What should I tell my health care provider before I take this medicine? They need to know if you have any of these conditions:  heart disease  heart failure  lung or breathing disease, like asthma  an unusual or allergic reaction to trastuzumab, or other medications, foods, dyes, or preservatives  pregnant or trying to get pregnant  breast-feeding How should I use this medicine? This medicine is for injection under the skin. It is given by a health care professional in a hospital or clinic setting. Talk to your pediatrician regarding the use of this medicine in children. This medicine is not approved for use in children. Overdosage: If you think you have taken too much of this medicine contact a poison control center or emergency room at once. NOTE: This medicine is only for you. Do not share this medicine with others. What if I miss a dose? It is important not to miss a dose. Call your doctor or health care professional if you are unable to keep an appointment. What may interact with this medicine? This medicine may interact with the following medications:  certain types of chemotherapy, such as daunorubicin, doxorubicin, epirubicin, and idarubicin This list may not describe all possible interactions. Give your health care provider a list of all the medicines, herbs, non-prescription drugs, or dietary supplements you use. Also tell them if you smoke, drink alcohol, or use illegal drugs. Some items may interact with your medicine. What should I watch for while  using this medicine? Visit your doctor for checks on your progress. Report any side effects. Continue your course of treatment even though you feel ill unless your doctor tells you to stop. Call your doctor or health care professional for advice if you get a fever, chills or sore throat, or other symptoms of a cold or flu. Do not treat yourself. Try to avoid being around people who are sick. You may experience fever, chills and shaking during your first infusion. These effects are usually mild and can be treated with other medicines. Report any side effects during the infusion to your health care professional. Fever and chills usually do not happen with later infusions. Do not become pregnant while taking this medicine or for 7 months after stopping it. Women should inform their doctor if they wish to become pregnant or think they might be pregnant. Women of child-bearing potential will need to have a negative pregnancy test before starting this medicine. There is a potential for serious side effects to an unborn child. Talk to your health care professional or pharmacist for more information. Do not breast-feed an infant while taking this medicine or for 7 months after stopping it. What side effects may I notice from receiving this medicine? Side effects that you should report to your doctor or health care professional as soon as possible:  allergic reactions like skin rash, itching or hives, swelling of the face, lips, or tongue  breathing problems  chest pain or palpitations  cough  fever  general ill feeling or flu-like symptoms  signs of worsening heart failure like breathing problems; swelling in your legs and  feet Side effects that usually do not require medical attention (report these to your doctor or health care professional if they continue or are bothersome):  bone pain  changes in taste  diarrhea  joint pain  nausea/vomiting  unusually weak or tired  weight loss This  list may not describe all possible side effects. Call your doctor for medical advice about side effects. You may report side effects to FDA at 1-800-FDA-1088. Where should I keep my medicine? This drug is given in a hospital or clinic and will not be stored at home. NOTE: This sheet is a summary. It may not cover all possible information. If you have questions about this medicine, talk to your doctor, pharmacist, or health care provider.  2020 Elsevier/Gold Standard (2018-02-23 21:54:17)

## 2020-04-17 DIAGNOSIS — Z0000 Encounter for general adult medical examination without abnormal findings: Secondary | ICD-10-CM | POA: Diagnosis not present

## 2020-04-17 DIAGNOSIS — G4700 Insomnia, unspecified: Secondary | ICD-10-CM | POA: Diagnosis not present

## 2020-04-17 DIAGNOSIS — H269 Unspecified cataract: Secondary | ICD-10-CM | POA: Diagnosis not present

## 2020-04-17 DIAGNOSIS — M5416 Radiculopathy, lumbar region: Secondary | ICD-10-CM | POA: Diagnosis not present

## 2020-04-17 DIAGNOSIS — Z96642 Presence of left artificial hip joint: Secondary | ICD-10-CM | POA: Diagnosis not present

## 2020-04-17 DIAGNOSIS — C7989 Secondary malignant neoplasm of other specified sites: Secondary | ICD-10-CM | POA: Diagnosis not present

## 2020-04-17 DIAGNOSIS — M810 Age-related osteoporosis without current pathological fracture: Secondary | ICD-10-CM | POA: Diagnosis not present

## 2020-04-17 DIAGNOSIS — E7800 Pure hypercholesterolemia, unspecified: Secondary | ICD-10-CM | POA: Diagnosis not present

## 2020-04-17 DIAGNOSIS — R82998 Other abnormal findings in urine: Secondary | ICD-10-CM | POA: Diagnosis not present

## 2020-04-17 DIAGNOSIS — C50211 Malignant neoplasm of upper-inner quadrant of right female breast: Secondary | ICD-10-CM | POA: Diagnosis not present

## 2020-04-20 ENCOUNTER — Inpatient Hospital Stay: Payer: Medicare Other | Attending: Hematology and Oncology

## 2020-04-20 ENCOUNTER — Other Ambulatory Visit: Payer: Self-pay

## 2020-04-20 ENCOUNTER — Ambulatory Visit (HOSPITAL_COMMUNITY)
Admission: RE | Admit: 2020-04-20 | Discharge: 2020-04-20 | Disposition: A | Discharge status: Home / Self Care | Payer: Medicare Other | Source: Ambulatory Visit | Attending: Hematology and Oncology | Admitting: Hematology and Oncology

## 2020-04-20 DIAGNOSIS — Z170 Estrogen receptor positive status [ER+]: Secondary | ICD-10-CM | POA: Diagnosis not present

## 2020-04-20 DIAGNOSIS — C3490 Malignant neoplasm of unspecified part of unspecified bronchus or lung: Secondary | ICD-10-CM | POA: Insufficient documentation

## 2020-04-20 DIAGNOSIS — Z79899 Other long term (current) drug therapy: Secondary | ICD-10-CM | POA: Insufficient documentation

## 2020-04-20 DIAGNOSIS — Z923 Personal history of irradiation: Secondary | ICD-10-CM | POA: Diagnosis not present

## 2020-04-20 DIAGNOSIS — Z5112 Encounter for antineoplastic immunotherapy: Secondary | ICD-10-CM | POA: Diagnosis not present

## 2020-04-20 DIAGNOSIS — Z79811 Long term (current) use of aromatase inhibitors: Secondary | ICD-10-CM | POA: Insufficient documentation

## 2020-04-20 DIAGNOSIS — Z7982 Long term (current) use of aspirin: Secondary | ICD-10-CM | POA: Diagnosis not present

## 2020-04-20 DIAGNOSIS — R5383 Other fatigue: Secondary | ICD-10-CM | POA: Diagnosis not present

## 2020-04-20 DIAGNOSIS — C50211 Malignant neoplasm of upper-inner quadrant of right female breast: Secondary | ICD-10-CM | POA: Diagnosis not present

## 2020-04-20 DIAGNOSIS — Z9221 Personal history of antineoplastic chemotherapy: Secondary | ICD-10-CM | POA: Diagnosis not present

## 2020-04-20 LAB — CBC WITH DIFFERENTIAL (CANCER CENTER ONLY)
Abs Immature Granulocytes: 0.01 10*3/uL (ref 0.00–0.07)
Basophils Absolute: 0.1 10*3/uL (ref 0.0–0.1)
Basophils Relative: 1 %
Eosinophils Absolute: 0.2 10*3/uL (ref 0.0–0.5)
Eosinophils Relative: 3 %
HCT: 43.6 % (ref 36.0–46.0)
Hemoglobin: 13.8 g/dL (ref 12.0–15.0)
Immature Granulocytes: 0 %
Lymphocytes Relative: 18 %
Lymphs Abs: 1.1 10*3/uL (ref 0.7–4.0)
MCH: 28 pg (ref 26.0–34.0)
MCHC: 31.7 g/dL (ref 30.0–36.0)
MCV: 88.4 fL (ref 80.0–100.0)
Monocytes Absolute: 0.6 10*3/uL (ref 0.1–1.0)
Monocytes Relative: 10 %
Neutro Abs: 4 10*3/uL (ref 1.7–7.7)
Neutrophils Relative %: 68 %
Platelet Count: 210 10*3/uL (ref 150–400)
RBC: 4.93 MIL/uL (ref 3.87–5.11)
RDW: 15.2 % (ref 11.5–15.5)
WBC Count: 5.9 10*3/uL (ref 4.0–10.5)
nRBC: 0 % (ref 0.0–0.2)

## 2020-04-20 LAB — CMP (CANCER CENTER ONLY)
ALT: 31 U/L (ref 0–44)
AST: 20 U/L (ref 15–41)
Albumin: 3.8 g/dL (ref 3.5–5.0)
Alkaline Phosphatase: 85 U/L (ref 38–126)
Anion gap: 7 (ref 5–15)
BUN: 10 mg/dL (ref 8–23)
CO2: 30 mmol/L (ref 22–32)
Calcium: 9.9 mg/dL (ref 8.9–10.3)
Chloride: 100 mmol/L (ref 98–111)
Creatinine: 0.82 mg/dL (ref 0.44–1.00)
GFR, Est AFR Am: >60 mL/min (ref >60)
GFR, Estimated: >60 mL/min (ref >60)
Glucose, Bld: 105 mg/dL — ABNORMAL HIGH (ref 70–99)
Potassium: 5 mmol/L (ref 3.5–5.1)
Sodium: 137 mmol/L (ref 135–145)
Total Bilirubin: 0.8 mg/dL (ref 0.3–1.2)
Total Protein: 7.2 g/dL (ref 6.5–8.1)

## 2020-04-20 MED ORDER — IOHEXOL 300 MG/ML  SOLN
75.0000 mL | Freq: Once | INTRAMUSCULAR | Status: AC | PRN
  Administered 2020-04-20: 75 mL via INTRAVENOUS

## 2020-04-20 MED ORDER — SODIUM CHLORIDE (PF) 0.9 % IJ SOLN
INTRAMUSCULAR | Status: AC
  Filled 2020-04-20: qty 50

## 2020-04-23 ENCOUNTER — Ambulatory Visit: Payer: Medicare Other | Admitting: Hematology and Oncology

## 2020-04-23 DIAGNOSIS — Z1212 Encounter for screening for malignant neoplasm of rectum: Secondary | ICD-10-CM | POA: Diagnosis not present

## 2020-05-01 NOTE — Progress Notes (Signed)
Pharmacist Chemotherapy Monitoring - Follow Up Assessment    I verify that I have reviewed each item in the below checklist:  . Regimen for the patient is scheduled for the appropriate day and plan matches scheduled date. Marland Kitchen Appropriate non-routine labs are ordered dependent on drug ordered. . If applicable, additional medications reviewed and ordered per protocol based on lifetime cumulative doses and/or treatment regimen.   Plan for follow-up and/or issues identified: No . I-vent associated with next due treatment: No . MD and/or nursing notified: No  [name] French,[name] French 05/01/2020 9:18 AM

## 2020-05-06 NOTE — Progress Notes (Signed)
Patient Care Team: Tisovec, Fransico Him, MD as PCP - General (Internal Medicine) Martinique, Peter M, MD as PCP - Cardiology (Cardiology) Marcy Panning, MD as Consulting Physician (Oncology)  DIAGNOSIS:    ICD-10-CM   1. Malignant neoplasm of upper-inner quadrant of right breast in female, estrogen receptor positive ([place])  C50.211    Z17.0     SUMMARY OF ONCOLOGIC HISTORY: Oncology History  Malignant neoplasm of upper-inner quadrant of right breast in female, estrogen receptor positive ([place])  05/08/2013 Initial Biopsy   Right: grade 1-2 invasive mammary carcinoma ER positive PR positive HER-2/neu negative with Ki-67 30% (MRI 2.8 cm)second smaller mass together 3.8cm   05/11/2013 - 11/04/2013 Anti-estrogen oral therapy   Letrozole 2.5 mg Neoadjuvant anti-estrogen therapy   11/06/2013 Surgery   Bilateral Lumpectomies: Left: sclerosing lesion with ALH fibrocystic changes with microcalcifications. Right: Grade 1 ILC 2.7 cm with LCIS    Radiation Therapy   Patient declined   11/19/2013 - 06/09/2015 Anti-estrogen oral therapy   Letrozole 2.5 mg (stopped for arthralgias and myalgias and fatigue accompanied with hair loss)   01/01/2020 Relapse/Recurrence   Screening mammogram detected a right breast mass. Diagnostic mammogram showed a 4.2cm mass at the 11 o'clock position in the right breast. Biopsy showed invasive mammary carcinoma, grade 2, HER-2 + (3+), ER+ 95%, PR+ 90%, Ki67 20%.    02/10/2020 Surgery   Right lumpectomy Ninfa Linden): mammary carcinoma, grade 2, 3.3cm, clear margins.   03/05/2020 -  Chemotherapy   The patient had trastuzumab-hyaluronidase-oysk (HERCEPTIN HYLECTA) 600-10000 MG-UNT/5ML chemo SQ injection 600 mg, 600 mg, Subcutaneous,  Once, 3 of 5 cycles Administration: 600 mg (03/05/2020), 600 mg (03/26/2020), 600 mg (04/16/2020)  for chemotherapy treatment.    Non-small cell cancer of right lung ([place])  10/20/2017 PET scan   6 cm central left lower lobe pulmonary mass is markedly  hypermetabolic with SUV max = 62.3 and extends into the left hilum.  The sub solid 2.4 cm pulmonary nodule in the right middle lobe also shows FDG accumulation with SUV max = 1.9. No evidence for hypermetabolic mediastinal or right hilar lymphadenopathy. L2 uptake degenerative.  LLL:T3N0M0 stage IIb clinical stage; RML: T2N0 Stage 1B   10/20/2017 Imaging   MRI brain: No metastatic disease, right inferior parietal convexity meningioma 2.9 x 2.7 x 1.4 cm.  This indents the brain and associated with mild brain edema   11/01/2017 Initial Diagnosis   Transbronchial needle aspiration right middle lobe and left lower lobe brushings: Both are positive for malignant cells consistent with non-small cell lung cancer    11/29/2017 - 01/18/2018 Radiation Therapy   Radiation   12/01/2017 Pathology Results   Foundation 1:TPS score: 5%; MS-Stable, TMG High, AKT2 Amp, RB1, TP 53 (no mutations noted in EGFR, K-ras, Al, BRAF, RET, ERBB2, Ros 1)     CHIEF COMPLIANT: Follow-up of recurrent right breast cancer on Herceptin mainenance  INTERVAL HISTORY: [name] French is a 86 y.o. with above-mentioned history of recurrent right breast cancer treated with lumpectomy and who is currently on Herceptin maintenance. Chest CT on 04/20/20 showed no evidence of recurrent or metastatic disease. She presents to the clinic today for treatment.    ALLERGIES:  is allergic to other; codeine; hydrocodone; and neosporin [neomycin-bacitracin zn-polymyx].  MEDICATIONS:  Current Outpatient Medications  Medication Sig Dispense Refill  . acetaminophen (TYLENOL) 500 MG tablet Take 500 mg by mouth every 6 (six) hours as needed for moderate pain or headache.    . anastrozole (ARIMIDEX) 1 MG tablet  Take 1 tablet (1 mg total) by mouth daily. 90 tablet 3  . Artificial Tear Solution (BION TEARS OP) Place 1-2 drops into both eyes as needed (dry eyes).     Marland Kitchen aspirin 81 MG tablet Take 81 mg by mouth daily.    . cetirizine (ZYRTEC) 10 MG  tablet Take 10 mg daily by mouth.     . Cholecalciferol (VITAMIN D-3) 1000 UNITS CAPS Take 1,000 Units daily by mouth.     . doxylamine, Sleep, (UNISOM) 25 MG tablet Take 25 mg at bedtime by mouth.    Marland Kitchen Glucosamine-Chondroit-MSM-C-Mn CAPS Take 1 capsule by mouth daily.    Marland Kitchen ibuprofen (ADVIL) 200 MG tablet Take 200 mg by mouth every 6 (six) hours as needed for headache or moderate pain.    . Menthol, Topical Analgesic, (BIOFREEZE EX) Apply 1 application topically daily as needed (leg pain).    . metoprolol tartrate (LOPRESSOR) 25 MG tablet TAKE ONE TABLET TWICE DAILY (Patient taking differently: Take 12.5 mg by mouth 2 (two) times daily. ) 180 tablet 3  . naphazoline-pheniramine (NAPHCON-A) 0.025-0.3 % ophthalmic solution Place 1-2 drops into both eyes as needed for eye irritation or allergies.     . nitroGLYCERIN (NITROSTAT) 0.4 MG SL tablet Place 1 tablet (0.4 mg total) under the tongue every 5 (five) minutes as needed for chest pain. 90 tablet 3  . Polyethyl Glycol-Propyl Glycol (SYSTANE OP) Place 1 drop into both eyes daily as needed (dry eyes).    . pravastatin (PRAVACHOL) 40 MG tablet Take 40 mg daily by mouth.     . Probiotic Product (ALIGN) 4 MG CAPS Take 4 mg daily by mouth.    . SUPER B COMPLEX & C TABS Take 1 tablet by mouth daily.  0  . traMADol (ULTRAM) 50 MG tablet Take 1 tablet (50 mg total) by mouth every 6 (six) hours as needed for moderate pain or severe pain. 20 tablet 0  . zolpidem (AMBIEN) 5 MG tablet Take 2.5 mg by mouth at bedtime as needed for sleep.   2   Current Facility-Administered Medications  Medication Dose Route Frequency Provider Last Rate Last Admin  . 0.9 %  sodium chloride infusion  500 mL Intravenous Once Ladene Artist, MD        PHYSICAL EXAMINATION: ECOG PERFORMANCE STATUS: 1 - Symptomatic but completely ambulatory  There were no vitals filed for this visit. There were no vitals filed for this visit.  LABORATORY DATA:  I have reviewed the data as  listed CMP Latest Ref Rng & Units 04/20/2020 02/10/2020 01/17/2020  Glucose 70 - 99 mg/dL 105(H) 136(H) -  BUN 8 - 23 mg/dL _0 Creatinine 0.44 - 1.00 mg/dL 0.82 0.75 0.74  Sodium 135 - 145 mmol/L 137 138 -  Potassium 3.5 - 5.1 mmol/L 5.0 4.0 -  Chloride 98 - 111 mmol/L 100 100 -  CO2 22 - 32 mmol/L 30 25 -  Calcium 8.9 - 10.3 mg/dL 9.9 9.6 -  Total Protein 6.5 - 8.1 g/dL 7.2 - -  Total Bilirubin 0.3 - 1.2 mg/dL 0.8 - -  Alkaline Phos 38 - 126 U/L 85 - -  AST 15 - 41 U/L 20 - -  ALT 0 - 44 U/L 31 - -    Lab Results  Component Value Date   WBC 5.9 04/20/2020   HGB 13.8 04/20/2020   HCT 43.6 04/20/2020   MCV 88.4 04/20/2020   PLT 210 04/20/2020   NEUTROABS 4.0  04/20/2020    ASSESSMENT & PLAN:  Malignant neoplasm of upper-inner quadrant of right breast in female, estrogen receptor positive ([place]) 12/23/2019: Suspicious 4.2 cm mass in the right breast, no abnormal right axillary lymph nodes 01/03/2020:Right breast biopsy 11 o'clock position: Grade 2 invasive lobular carcinoma with LCIS ER 95%, PR 90%, Ki-67 20%, HER-2 3+ positive  CT CAP 01/17/20:Masslike consolidation following radiation measuring 6.8 x 3.6 cm as compared to 7.2 by 4.2 cm. Pleural thickening approximately 6 mm. No metastatic disease  02/10/2020:Right lumpectomy Ninfa Linden): mammary carcinoma, grade 2, 3.3cm, clear margins.  Treatment plan: Adjuvant anastrozole with Herceptin subcutaneously for a year ------------------------------------------------------------------------------------------------------------------------------------------ Current treatment: Herceptin subcutaneous cycle 4 Anastrozole toxicities:  1.  Occasional loose stools: She took Imodium  She takes half a tablet of anastrozole daily.  She does not report any side effects from anastrozole.  Fatigue: It could be from anastrozole or metoprolol.  so far she appears to be tolerating Herceptin and anastrozole very well. Return to clinic every  3 weeks for Herceptin every 6 weeks of follow-up with me.    No orders of the defined types were placed in this encounter.  The patient has a good understanding of the overall plan. she agrees with it. she will call with any problems that may develop before the next visit here.  Total time spent: 30 mins including face to face time and time spent for planning, charting and coordination of care  Nicholas Lose, MD 05/07/2020  I, Cloyde Reams Dorshimer, am acting as scribe for Dr. Nicholas Lose.  I have reviewed the above documentation for accuracy and completeness, and I agree with the above.

## 2020-05-07 ENCOUNTER — Other Ambulatory Visit: Payer: Medicare Other

## 2020-05-07 ENCOUNTER — Inpatient Hospital Stay: Payer: Medicare Other

## 2020-05-07 ENCOUNTER — Other Ambulatory Visit: Payer: Self-pay

## 2020-05-07 ENCOUNTER — Inpatient Hospital Stay (HOSPITAL_BASED_OUTPATIENT_CLINIC_OR_DEPARTMENT_OTHER): Payer: Medicare Other | Admitting: Hematology and Oncology

## 2020-05-07 DIAGNOSIS — Z79811 Long term (current) use of aromatase inhibitors: Secondary | ICD-10-CM | POA: Diagnosis not present

## 2020-05-07 DIAGNOSIS — Z170 Estrogen receptor positive status [ER+]: Secondary | ICD-10-CM

## 2020-05-07 DIAGNOSIS — Z5112 Encounter for antineoplastic immunotherapy: Secondary | ICD-10-CM | POA: Diagnosis not present

## 2020-05-07 DIAGNOSIS — C50211 Malignant neoplasm of upper-inner quadrant of right female breast: Secondary | ICD-10-CM | POA: Diagnosis not present

## 2020-05-07 DIAGNOSIS — Z923 Personal history of irradiation: Secondary | ICD-10-CM | POA: Diagnosis not present

## 2020-05-07 DIAGNOSIS — R5383 Other fatigue: Secondary | ICD-10-CM | POA: Diagnosis not present

## 2020-05-07 MED ORDER — ACETAMINOPHEN 325 MG PO TABS
ORAL_TABLET | ORAL | Status: AC
  Filled 2020-05-07: qty 2

## 2020-05-07 MED ORDER — ACETAMINOPHEN 325 MG PO TABS
650.0000 mg | ORAL_TABLET | Freq: Once | ORAL | Status: AC
  Administered 2020-05-07: 650 mg via ORAL

## 2020-05-07 MED ORDER — ANASTROZOLE 1 MG PO TABS
0.5000 mg | ORAL_TABLET | Freq: Every day | ORAL | 3 refills | Status: DC
Start: 2020-05-07 — End: 2021-03-15

## 2020-05-07 MED ORDER — DIPHENHYDRAMINE HCL 25 MG PO CAPS
25.0000 mg | ORAL_CAPSULE | Freq: Once | ORAL | Status: AC
  Administered 2020-05-07: 25 mg via ORAL

## 2020-05-07 MED ORDER — DIPHENHYDRAMINE HCL 25 MG PO CAPS
ORAL_CAPSULE | ORAL | Status: AC
  Filled 2020-05-07: qty 2

## 2020-05-07 MED ORDER — TRASTUZUMAB-HYALURONIDASE-OYSK 600-10000 MG-UNT/5ML ~~LOC~~ SOLN
600.0000 mg | Freq: Once | SUBCUTANEOUS | Status: AC
  Administered 2020-05-07: 600 mg via SUBCUTANEOUS
  Filled 2020-05-07: qty 5

## 2020-05-07 NOTE — Assessment & Plan Note (Signed)
12/23/2019: Suspicious 4.2 cm mass in the right breast, no abnormal right axillary lymph nodes 01/03/2020:Right breast biopsy 11 o'clock position: Grade 2 invasive lobular carcinoma with LCIS ER 95%, PR 90%, Ki-67 20%, HER-2 3+ positive  CT CAP 01/17/20:Masslike consolidation following radiation measuring 6.8 x 3.6 cm as compared to 7.2 by 4.2 cm. Pleural thickening approximately 6 mm. No metastatic disease  02/10/2020:Right lumpectomy Ninfa Linden): mammary carcinoma, grade 2, 3.3cm, clear margins.  Treatment plan: Adjuvant anastrozole with Herceptin subcutaneously for a year ------------------------------------------------------------------------------------------------------------------------------------------ Current treatment: Herceptin subcutaneous cycle 4 Anastrozole toxicities:  1.  Occasional loose stools: She took Imodium 2.  Chills after the infusion  so far she appears to be tolerating Herceptin and anastrozole very well. Return to clinic every 3 weeks for Herceptin every 6 weeks of follow-up with me.

## 2020-05-07 NOTE — Patient Instructions (Addendum)
[facility] Discharge Instructions for Patients Receiving Chemotherapy  Today you received the following chemotherapy agents:  Trastuzumab injection  To help prevent nausea and vomiting after your treatment, we encourage you to take your nausea medication as prescribed.  If you develop nausea and vomiting that is not controlled by your nausea medication, call the clinic.   BELOW ARE SYMPTOMS THAT SHOULD BE REPORTED IMMEDIATELY:  *FEVER GREATER THAN 100.5 F  *CHILLS WITH OR WITHOUT FEVER  NAUSEA AND VOMITING THAT IS NOT CONTROLLED WITH YOUR NAUSEA MEDICATION  *UNUSUAL SHORTNESS OF BREATH  *UNUSUAL BRUISING OR BLEEDING  TENDERNESS IN MOUTH AND THROAT WITH OR WITHOUT PRESENCE OF ULCERS  *URINARY PROBLEMS  *BOWEL PROBLEMS  UNUSUAL RASH Items with * indicate a potential emergency and should be followed up as soon as possible.  Feel free to call the clinic should you have any questions or concerns. The clinic phone number is [phone].  Please show the [facility] at check-in to the Emergency Department and triage nurse.

## 2020-05-22 ENCOUNTER — Other Ambulatory Visit: Payer: Self-pay

## 2020-05-22 ENCOUNTER — Telehealth: Payer: Self-pay | Admitting: *Deleted

## 2020-05-22 ENCOUNTER — Emergency Department (HOSPITAL_COMMUNITY): Payer: Medicare Other

## 2020-05-22 ENCOUNTER — Encounter (HOSPITAL_COMMUNITY): Payer: Self-pay | Admitting: Emergency Medicine

## 2020-05-22 ENCOUNTER — Inpatient Hospital Stay (HOSPITAL_COMMUNITY)
Admission: EM | Admit: 2020-05-22 | Discharge: 2020-05-29 | DRG: 643 | Disposition: A | Discharge status: Home Health | Payer: Medicare Other | Attending: Internal Medicine | Admitting: Internal Medicine

## 2020-05-22 DIAGNOSIS — E222 Syndrome of inappropriate secretion of antidiuretic hormone: Principal | ICD-10-CM | POA: Diagnosis present

## 2020-05-22 DIAGNOSIS — Z79811 Long term (current) use of aromatase inhibitors: Secondary | ICD-10-CM

## 2020-05-22 DIAGNOSIS — Z66 Do not resuscitate: Secondary | ICD-10-CM | POA: Diagnosis present

## 2020-05-22 DIAGNOSIS — E860 Dehydration: Secondary | ICD-10-CM | POA: Diagnosis present

## 2020-05-22 DIAGNOSIS — I10 Essential (primary) hypertension: Secondary | ICD-10-CM | POA: Diagnosis present

## 2020-05-22 DIAGNOSIS — E871 Hypo-osmolality and hyponatremia: Secondary | ICD-10-CM | POA: Diagnosis not present

## 2020-05-22 DIAGNOSIS — Z79899 Other long term (current) drug therapy: Secondary | ICD-10-CM

## 2020-05-22 DIAGNOSIS — J9811 Atelectasis: Secondary | ICD-10-CM | POA: Diagnosis not present

## 2020-05-22 DIAGNOSIS — Z8042 Family history of malignant neoplasm of prostate: Secondary | ICD-10-CM | POA: Diagnosis not present

## 2020-05-22 DIAGNOSIS — J90 Pleural effusion, not elsewhere classified: Secondary | ICD-10-CM | POA: Diagnosis not present

## 2020-05-22 DIAGNOSIS — R091 Pleurisy: Secondary | ICD-10-CM | POA: Diagnosis not present

## 2020-05-22 DIAGNOSIS — R5381 Other malaise: Secondary | ICD-10-CM | POA: Diagnosis present

## 2020-05-22 DIAGNOSIS — Z923 Personal history of irradiation: Secondary | ICD-10-CM | POA: Diagnosis not present

## 2020-05-22 DIAGNOSIS — Z9889 Other specified postprocedural states: Secondary | ICD-10-CM

## 2020-05-22 DIAGNOSIS — I9589 Other hypotension: Secondary | ICD-10-CM | POA: Diagnosis present

## 2020-05-22 DIAGNOSIS — M810 Age-related osteoporosis without current pathological fracture: Secondary | ICD-10-CM | POA: Diagnosis present

## 2020-05-22 DIAGNOSIS — C50211 Malignant neoplasm of upper-inner quadrant of right female breast: Secondary | ICD-10-CM | POA: Diagnosis not present

## 2020-05-22 DIAGNOSIS — I2721 Secondary pulmonary arterial hypertension: Secondary | ICD-10-CM | POA: Diagnosis present

## 2020-05-22 DIAGNOSIS — Z20822 Contact with and (suspected) exposure to covid-19: Secondary | ICD-10-CM | POA: Diagnosis present

## 2020-05-22 DIAGNOSIS — I5031 Acute diastolic (congestive) heart failure: Secondary | ICD-10-CM | POA: Diagnosis not present

## 2020-05-22 DIAGNOSIS — G4700 Insomnia, unspecified: Secondary | ICD-10-CM | POA: Diagnosis present

## 2020-05-22 DIAGNOSIS — E8770 Fluid overload, unspecified: Secondary | ICD-10-CM | POA: Diagnosis not present

## 2020-05-22 DIAGNOSIS — Z85828 Personal history of other malignant neoplasm of skin: Secondary | ICD-10-CM | POA: Diagnosis not present

## 2020-05-22 DIAGNOSIS — Z885 Allergy status to narcotic agent status: Secondary | ICD-10-CM

## 2020-05-22 DIAGNOSIS — J810 Acute pulmonary edema: Secondary | ICD-10-CM | POA: Diagnosis not present

## 2020-05-22 DIAGNOSIS — Z96642 Presence of left artificial hip joint: Secondary | ICD-10-CM | POA: Diagnosis present

## 2020-05-22 DIAGNOSIS — C50911 Malignant neoplasm of unspecified site of right female breast: Secondary | ICD-10-CM | POA: Diagnosis present

## 2020-05-22 DIAGNOSIS — Z883 Allergy status to other anti-infective agents status: Secondary | ICD-10-CM

## 2020-05-22 DIAGNOSIS — Z87891 Personal history of nicotine dependence: Secondary | ICD-10-CM

## 2020-05-22 DIAGNOSIS — R0902 Hypoxemia: Secondary | ICD-10-CM | POA: Diagnosis not present

## 2020-05-22 DIAGNOSIS — Z86011 Personal history of benign neoplasm of the brain: Secondary | ICD-10-CM

## 2020-05-22 DIAGNOSIS — R05 Cough: Secondary | ICD-10-CM | POA: Diagnosis not present

## 2020-05-22 DIAGNOSIS — Z801 Family history of malignant neoplasm of trachea, bronchus and lung: Secondary | ICD-10-CM | POA: Diagnosis not present

## 2020-05-22 DIAGNOSIS — R531 Weakness: Secondary | ICD-10-CM | POA: Diagnosis not present

## 2020-05-22 DIAGNOSIS — C3491 Malignant neoplasm of unspecified part of right bronchus or lung: Secondary | ICD-10-CM | POA: Diagnosis present

## 2020-05-22 DIAGNOSIS — E861 Hypovolemia: Secondary | ICD-10-CM | POA: Diagnosis present

## 2020-05-22 DIAGNOSIS — C3432 Malignant neoplasm of lower lobe, left bronchus or lung: Secondary | ICD-10-CM | POA: Diagnosis not present

## 2020-05-22 DIAGNOSIS — Z09 Encounter for follow-up examination after completed treatment for conditions other than malignant neoplasm: Secondary | ICD-10-CM

## 2020-05-22 DIAGNOSIS — R000 Tachycardia, unspecified: Secondary | ICD-10-CM | POA: Diagnosis not present

## 2020-05-22 DIAGNOSIS — R948 Abnormal results of function studies of other organs and systems: Secondary | ICD-10-CM

## 2020-05-22 DIAGNOSIS — Z7982 Long term (current) use of aspirin: Secondary | ICD-10-CM | POA: Diagnosis not present

## 2020-05-22 DIAGNOSIS — D638 Anemia in other chronic diseases classified elsewhere: Secondary | ICD-10-CM | POA: Diagnosis present

## 2020-05-22 DIAGNOSIS — J189 Pneumonia, unspecified organism: Secondary | ICD-10-CM | POA: Diagnosis not present

## 2020-05-22 DIAGNOSIS — R0602 Shortness of breath: Secondary | ICD-10-CM

## 2020-05-22 DIAGNOSIS — Z8249 Family history of ischemic heart disease and other diseases of the circulatory system: Secondary | ICD-10-CM | POA: Diagnosis not present

## 2020-05-22 DIAGNOSIS — J9601 Acute respiratory failure with hypoxia: Secondary | ICD-10-CM | POA: Diagnosis present

## 2020-05-22 DIAGNOSIS — Z170 Estrogen receptor positive status [ER+]: Secondary | ICD-10-CM

## 2020-05-22 DIAGNOSIS — R519 Headache, unspecified: Secondary | ICD-10-CM | POA: Diagnosis present

## 2020-05-22 LAB — SARS CORONAVIRUS 2 BY RT PCR (HOSPITAL ORDER, PERFORMED IN ~~LOC~~ HOSPITAL LAB): SARS Coronavirus 2: NEGATIVE

## 2020-05-22 LAB — BASIC METABOLIC PANEL
Anion gap: 10 (ref 5–15)
BUN: 9 mg/dL (ref 8–23)
CO2: 25 mmol/L (ref 22–32)
Calcium: 8.7 mg/dL — ABNORMAL LOW (ref 8.9–10.3)
Chloride: 85 mmol/L — ABNORMAL LOW (ref 98–111)
Creatinine, Ser: 0.51 mg/dL (ref 0.44–1.00)
GFR calc Af Amer: >60 mL/min (ref >60)
GFR calc non Af Amer: >60 mL/min (ref >60)
Glucose, Bld: 121 mg/dL — ABNORMAL HIGH (ref 70–99)
Potassium: 4.4 mmol/L (ref 3.5–5.1)
Sodium: 120 mmol/L — ABNORMAL LOW (ref 135–145)

## 2020-05-22 LAB — CBC WITH DIFFERENTIAL/PLATELET
Abs Immature Granulocytes: 0.02 10*3/uL (ref 0.00–0.07)
Basophils Absolute: 0 10*3/uL (ref 0.0–0.1)
Basophils Relative: 1 %
Eosinophils Absolute: 0 10*3/uL (ref 0.0–0.5)
Eosinophils Relative: 0 %
HCT: 37.8 % (ref 36.0–46.0)
Hemoglobin: 12.6 g/dL (ref 12.0–15.0)
Immature Granulocytes: 0 %
Lymphocytes Relative: 7 %
Lymphs Abs: 0.5 10*3/uL — ABNORMAL LOW (ref 0.7–4.0)
MCH: 28.1 pg (ref 26.0–34.0)
MCHC: 33.3 g/dL (ref 30.0–36.0)
MCV: 84.2 fL (ref 80.0–100.0)
Monocytes Absolute: 0.4 10*3/uL (ref 0.1–1.0)
Monocytes Relative: 7 %
Neutro Abs: 5.8 10*3/uL (ref 1.7–7.7)
Neutrophils Relative %: 85 %
Platelets: 177 10*3/uL (ref 150–400)
RBC: 4.49 MIL/uL (ref 3.87–5.11)
RDW: 14 % (ref 11.5–15.5)
WBC: 6.8 10*3/uL (ref 4.0–10.5)
nRBC: 0 % (ref 0.0–0.2)

## 2020-05-22 LAB — BRAIN NATRIURETIC PEPTIDE: B Natriuretic Peptide: 260.1 pg/mL — ABNORMAL HIGH (ref 0.0–100.0)

## 2020-05-22 MED ORDER — ONDANSETRON HCL 4 MG/2ML IJ SOLN
4.0000 mg | Freq: Once | INTRAMUSCULAR | Status: AC
  Administered 2020-05-22: 4 mg via INTRAVENOUS

## 2020-05-22 MED ORDER — ACETAMINOPHEN 325 MG PO TABS
650.0000 mg | ORAL_TABLET | Freq: Four times a day (QID) | ORAL | Status: DC | PRN
  Administered 2020-05-23 – 2020-05-24 (×3): 650 mg via ORAL
  Filled 2020-05-22 (×3): qty 2

## 2020-05-22 MED ORDER — SODIUM CHLORIDE 0.9 % IV SOLN: INTRAVENOUS | Status: DC

## 2020-05-22 MED ORDER — ONDANSETRON HCL 4 MG PO TABS: 4.0000 mg | ORAL_TABLET | Freq: Four times a day (QID) | ORAL | Status: DC | PRN

## 2020-05-22 MED ORDER — ENOXAPARIN SODIUM 40 MG/0.4ML ~~LOC~~ SOLN
40.0000 mg | Freq: Every day | SUBCUTANEOUS | Status: DC
  Administered 2020-05-23 – 2020-05-25 (×3): 40 mg via SUBCUTANEOUS
  Filled 2020-05-22 (×3): qty 0.4

## 2020-05-22 MED ORDER — ACETAMINOPHEN 650 MG RE SUPP: 650.0000 mg | Freq: Four times a day (QID) | RECTAL | Status: DC | PRN

## 2020-05-22 MED ORDER — ONDANSETRON HCL 4 MG/2ML IJ SOLN
4.0000 mg | Freq: Four times a day (QID) | INTRAMUSCULAR | Status: DC | PRN
  Administered 2020-05-23: 4 mg via INTRAVENOUS
  Filled 2020-05-22: qty 2

## 2020-05-22 NOTE — Progress Notes (Signed)
Pharmacist Chemotherapy Monitoring - Follow Up Assessment    I verify that I have reviewed each item in the below checklist:  . Regimen for the patient is scheduled for the appropriate day and plan matches scheduled date. Marland Kitchen Appropriate non-routine labs are ordered dependent on drug ordered. . If applicable, additional medications reviewed and ordered per protocol based on lifetime cumulative doses and/or treatment regimen.   Plan for follow-up and/or issues identified: No . I-vent associated with next due treatment: No . MD and/or nursing notified: No  Heaton Sarin K 05/22/2020 10:21 AM

## 2020-05-22 NOTE — H&P (Signed)
History and Physical   ENVY MENO ATF:[id number] DOB: Mar 09, 1926 DOA: 05/22/2020  Referring MD/NP/PA: Dr. Langston French  PCP: [name] Casco Fransico Him, MD   Patient coming from: Home  Chief Complaint: Hyponatremia  HPI: [name] French is a 86 y.o. female with medical history significant of lung cancer, breast cancer currently on chemotherapy, anemia of chronic disease, status post previous breast surgery, diverticulosis, history of meningioma.,  Osteoporosis who presented with upper respiratory-like infections symptoms in the last 3 to 4 days.  She was seen by her PCP and was given antibiotics.  She has been having dry hacking cough and sore throat.  COVID-19 screen so far negative.  No one else is sick around her.  She had a chest x-ray in the doctor's office showing no significant findings however her sodium was noted to be very low and sent to the hospital.  In the ER she reported some mild dizziness.  She is otherwise has loss of appetite in the last few days.  She has barely taken sips of soup.  She is nauseated but did not have any diarrhea and no vomiting.  Patient not currently on significant diuretics.  She was noted to have profound hyponatremia in the ER and patient is being admitted to the hospital for evaluation of symptomatic hyponatremia..  ED Course: Temperature is 98.3 blood pressure 155/68 pulse 115 respiratory of 31 oxygen sat 90% on room air.  Sodium is 120 chloride 85 calcium 8.7 glucose 121 the rest of the chemistry and CBC are within normal.  BNP of 260.  COVID-19 so far negative.  Chest x-ray showed marked opacification of the left lung base probably atelectasis versus infiltrates moderate-sized left pleural effusion with mild to moderate amount of left apical pleural fluid.  Patient being admitted with hyponatremia and possible pneumonia.  Review of Systems: As per HPI otherwise 10 point review of systems negative.    Past Medical History:  Diagnosis Date  . Allergic rhinitis     . Anemia    during pregnancy  . Arthritis   . Breast cancer ([place]) 05/08/13   right upper inner, invasive mammary  . Breast cancer, right breast ([place]) 04/16/2013   Underwent lumpectomy on 11/06/13. Path showed G1 ILC, 2.7 cm, neg margins, receptor+, Her2neg   . Diverticulosis   . Dyspnea    when carry heavy packages  . Dysrhythmia    Sinus Tach- on Metoprolol  . 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 Hemorrhoids   . Hx of adenomatous colonic polyps 05/2002  . Lung cancer ([place])   . Lung cancer ([place]) 10/2017  . Meningioma ([place])   . Osteoporosis   . Pneumonia     x2 last time was 2019  . Rosacea   . Skin cancer   . Tachycardia   . 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 Use of letrozole ([place])    neoadjuvant antiestrogen therapy with letrozole 2.5 mg daily x 7 monhts    Past Surgical History:  Procedure Laterality Date  . BREAST BIOPSY Left 1960   lt br bx/benign  . BREAST LUMPECTOMY Right   . BREAST LUMPECTOMY Right 02/10/2020   Procedure: RIGHT BREAST LUMPECTOMY;  Surgeon: [name] Keens, MD;  Location: [facility];  Service: General;  Laterality: Right;  . BREAST LUMPECTOMY WITH NEEDLE LOCALIZATION Bilateral 11/06/2013   Procedure: BREAST LUMPECTOMY WITH NEEDLE LOCALIZATION;  Surgeon: [name] Lasso, MD;  Location: [facility];  Service: General;  Laterality: Bilateral;  .  COLONOSCOPY    . EYE SURGERY Bilateral    both cataracts  . MOHS SURGERY Right    nose basal/squamous  . TOTAL HIP ARTHROPLASTY Left 08/31/2018   Procedure: LEFT TOTAL HIP ARTHROPLASTY ANTERIOR APPROACH;  Surgeon: [name] Rossetti, MD;  Location: [facility];  Service: Orthopedics;  Laterality: Left;  Marland Kitchen VIDEO BRONCHOSCOPY WITH ENDOBRONCHIAL NAVIGATION N/A 11/01/2017   Procedure: VIDEO BRONCHOSCOPY WITH ENDOBRONCHIAL NAVIGATION;  Surgeon: [name] Nakayama, MD;  Location: [facility];  Service: Thoracic;  Laterality: N/A;  . WRIST SURGERY  1990   lt     reports that she quit smoking  about 41 years ago. Her smoking use included cigarettes. She has a 12.50 pack-year smoking history. She has never used smokeless tobacco. She reports current alcohol use. She reports that she does not use drugs.  Allergies  Allergen Reactions  . Other Anaphylaxis    Lobster- "years ago"  . Codeine Itching    Insomnia   . Hydrocodone Itching and Other (See Comments)    Insomnia  . Neosporin [Neomycin-Bacitracin Zn-Polymyx] Rash    Family History  Problem Relation Age of Onset  . Heart disease Mother   . Prostate cancer Father   . Lung cancer Sister      Prior to Admission medications   Medication Sig Start Date End Date Taking? Authorizing Provider  acetaminophen (TYLENOL) 500 MG tablet Take 500 mg by mouth every 6 (six) hours as needed for moderate pain or headache.    [provider]  anastrozole (ARIMIDEX) 1 MG tablet Take 0.5 tablets (0.5 mg total) by mouth daily. 05/07/20   [name] Lose, MD  Artificial Tear Solution (BION TEARS OP) Place 1-2 drops into both eyes as needed (dry eyes).     [provider]  aspirin 81 MG tablet Take 81 mg by mouth daily.    [provider]  cetirizine (ZYRTEC) 10 MG tablet Take 10 mg daily by mouth.     [provider]  Cholecalciferol (VITAMIN D-3) 1000 UNITS CAPS Take 1,000 Units daily by mouth.     [provider]  doxylamine, Sleep, (UNISOM) 25 MG tablet Take 25 mg at bedtime by mouth.    [provider]  Glucosamine-Chondroit-MSM-C-Mn CAPS Take 1 capsule by mouth daily.    [provider]  ibuprofen (ADVIL) 200 MG tablet Take 200 mg by mouth every 6 (six) hours as needed for headache or moderate pain.    [provider]  Menthol, Topical Analgesic, (BIOFREEZE EX) Apply 1 application topically daily as needed (leg pain).    [provider]  metoprolol tartrate (LOPRESSOR) 25 MG tablet TAKE ONE TABLET TWICE DAILY Patient taking differently: Take 12.5 mg by mouth 2  (two) times daily.  01/03/20   French, [name] M, MD  naphazoline-pheniramine (NAPHCON-A) 0.025-0.3 % ophthalmic solution Place 1-2 drops into both eyes as needed for eye irritation or allergies.     [provider]  nitroGLYCERIN (NITROSTAT) 0.4 MG SL tablet Place 1 tablet (0.4 mg total) under the tongue every 5 (five) minutes as needed for chest pain. 07/02/18 02/05/20  French, [name] M, MD  Polyethyl Glycol-Propyl Glycol (SYSTANE OP) Place 1 drop into both eyes daily as needed (dry eyes).    [provider]  pravastatin (PRAVACHOL) 40 MG tablet Take 40 mg daily by mouth.  03/31/13   [provider]  Probiotic Product (ALIGN) 4 MG CAPS Take 4 mg daily by mouth.    [provider]  SUPER  B COMPLEX & C TABS Take 1 tablet by mouth daily. 10/17/16   [name] Lose, MD  traMADol (ULTRAM) 50 MG tablet Take 1 tablet (50 mg total) by mouth every 6 (six) hours as needed for moderate pain or severe pain. 02/10/20   [name] Keens, MD  zolpidem (AMBIEN) 5 MG tablet Take 2.5 mg by mouth at bedtime as needed for sleep.  03/26/15   [provider]    Physical Exam: Vitals:   05/22/20 2149 05/22/20 2229 05/22/20 2300 05/23/20 0029  BP: 123/70 136/83 131/84 (!) 155/68  Pulse: (!) 112 (!) 108 (!) 110 (!) 115  Resp: (!) 31 (!) 23 (!) 24 (!) 26  Temp:      TempSrc:      SpO2: 95% 95% 100% 97%  Weight:      Height:          Constitutional: Chronically ill looking no acute distress Vitals:   05/22/20 2149 05/22/20 2229 05/22/20 2300 05/23/20 0029  BP: 123/70 136/83 131/84 (!) 155/68  Pulse: (!) 112 (!) 108 (!) 110 (!) 115  Resp: (!) 31 (!) 23 (!) 24 (!) 26  Temp:      TempSrc:      SpO2: 95% 95% 100% 97%  Weight:      Height:       Eyes: PERRL, lids and conjunctivae normal ENMT: Mucous membranes are dry. Posterior pharynx clear of any exudate or lesions.Normal dentition.  Neck: normal, supple, no masses, no thyromegaly Respiratory: Coarse breath sounds  bilaterally, no wheezing, mild diffuse crackles. Normal respiratory effort. No accessory muscle use.  Cardiovascular: Sinus tachycardia, no murmurs / rubs / gallops. No extremity edema. 2+ pedal pulses. No carotid bruits.  Abdomen: no tenderness, no masses palpated. No hepatosplenomegaly. Bowel sounds positive.  Musculoskeletal: no clubbing / cyanosis. No joint deformity upper and lower extremities. Good ROM, no contractures. Normal muscle tone.  Skin: no rashes, lesions, ulcers. No induration Neurologic: CN 2-12 grossly intact. Sensation intact, DTR normal. Strength 5/5 in all 4.  Psychiatric: Normal judgment and insight. Alert and oriented x 3. Normal mood.     Labs on Admission: I have personally reviewed following labs and imaging studies  CBC: Recent Labs  Lab 05/22/20 1706  WBC 6.8  NEUTROABS 5.8  HGB 12.6  HCT 37.8  MCV 84.2  PLT 979   Basic Metabolic Panel: Recent Labs  Lab 05/22/20 1706  NA 120*  K 4.4  CL 85*  CO2 25  GLUCOSE 121*  BUN 9  CREATININE 0.51  CALCIUM 8.7*   GFR: Estimated Creatinine Clearance: 41.2 mL/min (by C-G formula based on SCr of 0.51 mg/dL). Liver Function Tests: No results for input(s): AST, ALT, ALKPHOS, BILITOT, PROT, ALBUMIN in the last 168 hours. No results for input(s): LIPASE, AMYLASE in the last 168 hours. No results for input(s): AMMONIA in the last 168 hours. Coagulation Profile: No results for input(s): INR, PROTIME in the last 168 hours. Cardiac Enzymes: No results for input(s): CKTOTAL, CKMB, CKMBINDEX, TROPONINI in the last 168 hours. BNP (last 3 results) No results for input(s): PROBNP in the last 8760 hours. HbA1C: No results for input(s): HGBA1C in the last 72 hours. CBG: No results for input(s): GLUCAP in the last 168 hours. Lipid Profile: No results for input(s): CHOL, HDL, LDLCALC, TRIG, CHOLHDL, LDLDIRECT in the last 72 hours. Thyroid Function Tests: No results for input(s): TSH, T4TOTAL, FREET4, T3FREE,  THYROIDAB in the last 72 hours. Anemia Panel: No results for input(s): VITAMINB12, FOLATE,  FERRITIN, TIBC, IRON, RETICCTPCT in the last 72 hours. Urine analysis:    Component Value Date/Time   COLORURINE STRAW (A) 02/06/2018 1702   APPEARANCEUR CLEAR 02/06/2018 1702   LABSPEC 1.001 (L) 02/06/2018 1702   PHURINE 7.0 02/06/2018 1702   GLUCOSEU NEGATIVE 02/06/2018 1702   HGBUR NEGATIVE 02/06/2018 1702   BILIRUBINUR NEGATIVE 02/06/2018 1702   KETONESUR 5 (A) 02/06/2018 1702   PROTEINUR NEGATIVE 02/06/2018 1702   NITRITE NEGATIVE 02/06/2018 1702   LEUKOCYTESUR NEGATIVE 02/06/2018 1702   Sepsis Labs: @LABRCNTIP (procalcitonin:4,lacticidven:4) ) Recent Results (from the past 240 hour(s))  SARS Coronavirus 2 by RT PCR (hospital order, performed in [facility] hospital lab) Nasopharyngeal Nasopharyngeal Swab     Status: None   Collection Time: 05/22/20  5:04 PM   Specimen: Nasopharyngeal Swab  Result Value Ref Range Status   SARS Coronavirus 2 NEGATIVE NEGATIVE Final    Comment: (NOTE) SARS-CoV-2 target nucleic acids are NOT DETECTED. The SARS-CoV-2 RNA is generally detectable in upper and lower respiratory specimens during the acute phase of infection. The lowest concentration of SARS-CoV-2 viral copies this assay can detect is 250 copies / mL. A negative result does not preclude SARS-CoV-2 infection and should not be used as the sole basis for treatment or other patient management decisions.  A negative result may occur with improper specimen collection / handling, submission of specimen other than nasopharyngeal swab, presence of viral mutation(s) within the areas targeted by this assay, and inadequate number of viral copies (<250 copies / mL). A negative result must be combined with clinical observations, patient history, and epidemiological information. Fact Sheet for Patients:   StrictlyIdeas.no Fact Sheet for Healthcare  Providers: BankingDealers.co.za This test is not yet approved or cleared  by the [place] FDA and has been authorized for detection and/or diagnosis of SARS-CoV-2 by FDA under an Emergency Use Authorization (EUA).  This EUA will remain in effect (meaning this test can be used) for the duration of the COVID-19 declaration under Section 564(b)(1) of the Act, 21 U.S.C. section 360bbb-3(b)(1), unless the authorization is terminated or revoked sooner. Performed at [facility], [place] [address], [place], [place] [address]      Radiological Exams on Admission: DG Chest Portable 1 View  Result Date: 05/22/2020 CLINICAL DATA:  Worsening cough. EXAM: PORTABLE CHEST 1 VIEW COMPARISON:  February 06, 2018 FINDINGS: The lungs are hyperinflated. Mild, diffuse, chronic appearing increased lung markings are seen. Mild linear scarring and/or atelectasis is again seen along the lateral aspect of the mid right lung. There is marked severity opacification of the left lung base. A moderate sized left-sided pleural effusion is noted. A mild-to-moderate amount of left apical pleural fluid is also seen. No pneumothorax is identified. The heart size and mediastinal contours are within normal limits. The visualized skeletal structures are unremarkable. IMPRESSION: 1. Marked severity opacification of the left lung base, likely consistent with atelectasis and/or infiltrate. 2. Moderate sized left pleural effusion with mild to moderate amount of left apical pleural fluid. 3. Mild, diffuse, chronic appearing increased lung markings. Electronically Signed   By: [place] French.D.   On: 05/22/2020 17:53    EKG: Independently reviewed.  Shows sinus tachycardia with borderline low voltage.  No ST changes.  Assessment/Plan Principal Problem:   Hyponatremia Active Problems:   Non-small cell cancer of right lung (HCC)   Pleural effusion on left     #1 hyponatremia: Most likely  secondary to dehydration and poor oral intake.  Early SIADH is  possible in the setting of known lung cancer.  Both sodium and chloride are down now.  I will presume initial dehydration.  Patient is slightly symptomatic.  I will initiate sodium chloride solution.  Monitor sodium level.  FENa could be measured if not responding.  Serum osmolarity will be checked.  Continue to monitor sodium slowly.  #2 history of lung cancer: Continue per oncology.  #3 possible pneumonia: Patient reporting congestion and a possible tract symptoms.  I would empirically treat with antibiotics while monitoring patient.  #4 history of breast cancer: Continue home regimen.  #5 suspected pleural effusion: Based on x-rays.  Probably malignant.  Patient not very symptomatic.  We will keep an eye on it.   DVT prophylaxis: Lovenox Code Status: DNR Family Communication: Daughter at bedside Disposition Plan: Home Consults called: None Admission status: Inpatient  Severity of Illness: The appropriate patient status for this patient is INPATIENT. Inpatient status is judged to be reasonable and necessary in order to provide the required intensity of service to ensure the patient's safety. The patient's presenting symptoms, physical exam findings, and initial radiographic and laboratory data in the context of their chronic comorbidities is felt to place them at high risk for further clinical deterioration. Furthermore, it is not anticipated that the patient will be medically stable for discharge from the hospital within 2 midnights of admission. The following factors support the patient status of inpatient.   " The patient's presenting symptoms include low sodium and cough. " The worrisome physical exam findings include chronically ill looking versus awake and alert. " The initial radiographic and laboratory data are worrisome because of sodium of 120. " The chronic co-morbidities include history of lung cancer.   * I  certify that at the point of admission it is my clinical judgment that the patient will require inpatient hospital care spanning beyond 2 midnights from the point of admission due to high intensity of service, high risk for further deterioration and high frequency of surveillance required.Barbette Merino MD [facility] Pager [phone]  If 7PM-7AM, please contact night-coverage [email or web address] Password [facility]  05/23/2020, 12:52 AM

## 2020-05-22 NOTE — ED Triage Notes (Signed)
Arrives via EMS from home, C/C hyponatremia. Has not been feeling well x3 days, had a cold/congestion and went to MD today, they did labs and found her sodium to be 120.

## 2020-05-22 NOTE — ED Provider Notes (Signed)
[facility] DEPT Provider Note   CSN: [id number] Arrival date & time: 05/22/20  1635     History Chief Complaint  Patient presents with  . hyponatremia    [name] French is a 86 y.o. female with a history of lung cancer, breast cancer, on chemotherapy, presented emergency department concern for hyponatremia.  Patient reports has been feeling unwell for the past 3 to 4 days.  She reports she has had a dry hacking cough for a few days.  Says she has had a sore throat.  Says she felt very tired and unwell.  She went to her primary care doctor today who did an x-ray in the office, the patient reports that the x-ray was "normal".  She says her doctor check blood tests and then she was contacted today telling her that her sodium was dangerously low.  She has never had a problem with this before.  She does report some dizziness and fatigue for several days.  She denies new medications but is currently taking oral chemo and 3 week injections for this.  She received both covid vaccines  Denies diarrhea, vomiting.  HPI     Past Medical History:  Diagnosis Date  . Allergic rhinitis   . Anemia    during pregnancy  . Arthritis   . Breast cancer ([place]) 05/08/13   right upper inner, invasive mammary  . Breast cancer, right breast ([place]) 04/16/2013   Underwent lumpectomy on 11/06/13. Path showed G1 ILC, 2.7 cm, neg margins, receptor+, Her2neg   . Diverticulosis   . Dyspnea    when carry heavy packages  . Dysrhythmia    Sinus Tach- on Metoprolol  . 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 Hemorrhoids   . Hx of adenomatous colonic polyps 05/2002  . Lung cancer ([place])   . Lung cancer ([place]) 10/2017  . Meningioma ([place])   . Osteoporosis   . Pneumonia     x2 last time was 2019  . Rosacea   . Skin cancer   . Tachycardia   . 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 Use of letrozole ([place])    neoadjuvant antiestrogen therapy with letrozole 2.5 mg daily x 7 monhts     Patient Active Problem List   Diagnosis Date Noted  . Hyponatremia 05/22/2020  . Senile osteoporosis 09/14/2018  . Sleep disturbances 09/14/2018  . History of breast cancer 09/14/2018  . ACP (advance care planning) 09/14/2018  . Status post total replacement of left hip 08/31/2018  . Unilateral primary osteoarthritis, left hip 08/06/2018  . Pleural effusion on left 07/11/2018  . Chest pain 12/25/2017  . Abnormal findings on radiological examination of gastrointestinal tract 11/16/2017  . Primary cancer of left lower lobe of lung ([place]) 11/15/2017  . Non-small cell cancer of right lung ([place]) 11/07/2017  . Lung mass 10/17/2017  . Malignant neoplasm of upper-inner quadrant of right breast in female, estrogen receptor positive ([place]) 06/10/2013  . Mass of breast, left 05/31/2013  . PERSONAL HISTORY OF COLONIC POLYPS 07/26/2010    Past Surgical History:  Procedure Laterality Date  . BREAST BIOPSY Left 1960   lt br bx/benign  . BREAST LUMPECTOMY Right   . BREAST LUMPECTOMY Right 02/10/2020   Procedure: RIGHT BREAST LUMPECTOMY;  Surgeon: Coralie Keens, MD;  Location: [place];  Service: General;  Laterality: Right;  . BREAST LUMPECTOMY WITH NEEDLE LOCALIZATION Bilateral 11/06/2013   Procedure: BREAST LUMPECTOMY WITH NEEDLE LOCALIZATION;  Surgeon: Haywood Lasso,  MD;  Location: [place];  Service: General;  Laterality: Bilateral;  . COLONOSCOPY    . EYE SURGERY Bilateral    both cataracts  . MOHS SURGERY Right    nose basal/squamous  . TOTAL HIP ARTHROPLASTY Left 08/31/2018   Procedure: LEFT TOTAL HIP ARTHROPLASTY ANTERIOR APPROACH;  Surgeon: Mcarthur Rossetti, MD;  Location: [facility];  Service: Orthopedics;  Laterality: Left;  Marland Kitchen VIDEO BRONCHOSCOPY WITH ENDOBRONCHIAL NAVIGATION N/A 11/01/2017   Procedure: VIDEO BRONCHOSCOPY WITH ENDOBRONCHIAL NAVIGATION;  Surgeon: [name], MD;  Location: [place];  Service: Thoracic;  Laterality: N/A;  . WRIST  SURGERY  1990   lt     OB History   No obstetric history on file.     Family History  Problem Relation Age of Onset  . Heart disease Mother   . Prostate cancer Father   . Lung cancer Sister     Social History   Tobacco Use  . Smoking status: Former Smoker    Packs/day: 0.50    Years: 25.00    Pack years: 12.50    Types: Cigarettes    Quit date: 12/19/1978    Years since quitting: 41.4  . Smokeless tobacco: Never Used  Substance Use Topics  . Alcohol use: Yes    Comment: "wine usually, no taste in the last in past few weeks- (02/07/2020)  . Drug use: No    Home Medications Prior to Admission medications   Medication Sig Start Date End Date Taking? Authorizing Provider  anastrozole (ARIMIDEX) 1 MG tablet Take 0.5 tablets (0.5 mg total) by mouth daily. 05/07/20  Yes Nicholas Lose, MD  Artificial Tear Solution (BION TEARS OP) Place 1-2 drops into both eyes as needed (dry eyes).    Yes [provider]  aspirin 81 MG tablet Take 81 mg by mouth daily.   Yes [provider]  cetirizine (ZYRTEC) 10 MG tablet Take 10 mg daily by mouth.    Yes [provider]  Cholecalciferol (VITAMIN D-3) 1000 UNITS CAPS Take 1,000 Units daily by mouth.    Yes [provider]  doxylamine, Sleep, (UNISOM) 25 MG tablet Take 25 mg at bedtime by mouth.   Yes [provider]  Glucosamine-Chondroit-MSM-C-Mn CAPS Take 1 capsule by mouth daily.   Yes [provider]  Menthol, Topical Analgesic, (BIOFREEZE EX) Apply 1 application topically daily as needed (leg pain).   Yes [provider]  metoprolol tartrate (LOPRESSOR) 25 MG tablet TAKE ONE TABLET TWICE DAILY Patient taking differently: Take 12.5 mg by mouth 2 (two) times daily.  01/03/20  Yes Martinique, Peter M, MD  naphazoline-pheniramine (NAPHCON-A) 0.025-0.3 % ophthalmic solution Place 1-2 drops into both eyes as needed for eye irritation or allergies.    Yes [provider]  Polyethyl  Glycol-Propyl Glycol (SYSTANE OP) Place 1 drop into both eyes daily as needed (dry eyes).   Yes [provider]  pravastatin (PRAVACHOL) 40 MG tablet Take 40 mg daily by mouth.  03/31/13  Yes [provider]  Probiotic Product (ALIGN) 4 MG CAPS Take 4 mg daily by mouth.   Yes [provider]  SUPER B COMPLEX & C TABS Take 1 tablet by mouth daily. 10/17/16  Yes Nicholas Lose, MD  traMADol (ULTRAM) 50 MG tablet Take 1 tablet (50 mg total) by mouth every 6 (six) hours as needed for moderate pain or severe pain. 02/10/20  Yes Coralie Keens, MD  zolpidem (AMBIEN) 5 MG tablet Take 2.5 mg by  mouth at bedtime as needed for sleep.  03/26/15  Yes [provider]  acetaminophen (TYLENOL) 500 MG tablet Take 500 mg by mouth every 6 (six) hours as needed for moderate pain or headache.    [provider]  ibuprofen (ADVIL) 200 MG tablet Take 200 mg by mouth every 6 (six) hours as needed for headache or moderate pain.    [provider]  nitroGLYCERIN (NITROSTAT) 0.4 MG SL tablet Place 1 tablet (0.4 mg total) under the tongue every 5 (five) minutes as needed for chest pain. Patient not taking: Reported on 05/22/2020 07/02/18 02/05/20  Martinique, Peter M, MD    Allergies    Other, Codeine, Hydrocodone, and Neosporin [neomycin-bacitracin zn-polymyx]  Review of Systems   Review of Systems  Constitutional: Positive for appetite change and fatigue. Negative for fever.  HENT: Positive for sore throat. Negative for ear pain.   Eyes: Negative for pain and visual disturbance.  Respiratory: Positive for cough and shortness of breath.   Cardiovascular: Negative for chest pain and palpitations.  Gastrointestinal: Negative for abdominal pain and vomiting.  Genitourinary: Negative for dysuria and hematuria.  Musculoskeletal: Negative for arthralgias and back pain.  Skin: Negative for color change and rash.  Neurological: Positive for light-headedness. Negative for syncope.    All other systems reviewed and are negative.   Physical Exam Updated Vital Signs BP (!) 155/68   Pulse (!) 115   Temp 98.3 F (36.8 C) (Oral)   Resp (!) 26   Ht 5\' 5"  (1.651 m)   Wt 60.3 kg   SpO2 97%   BMI 22.13 kg/m   Physical Exam Vitals and nursing note reviewed.  Constitutional:      General: She is not in acute distress.    Appearance: She is well-developed.  HENT:     Head: Normocephalic and atraumatic.  Eyes:     Conjunctiva/sclera: Conjunctivae normal.  Cardiovascular:     Rate and Rhythm: Normal rate and regular rhythm.     Pulses: Normal pulses.  Pulmonary:     Effort: Pulmonary effort is normal. No respiratory distress.     Comments: 92% on room air Abdominal:     Palpations: Abdomen is soft.     Tenderness: There is no abdominal tenderness.  Musculoskeletal:     Cervical back: Neck supple.  Skin:    General: Skin is warm and dry.  Neurological:     General: No focal deficit present.     Mental Status: She is alert and oriented to person, place, and time.  Psychiatric:        Mood and Affect: Mood normal.        Behavior: Behavior normal.     ED Results / Procedures / Treatments   Labs (all labs ordered are listed, but only abnormal results are displayed) Labs Reviewed  BASIC METABOLIC PANEL - Abnormal; Notable for the following components:      Result Value   Sodium 120 (*)    Chloride 85 (*)    Glucose, Bld 121 (*)    Calcium 8.7 (*)    All other components within normal limits  CBC WITH DIFFERENTIAL/PLATELET - Abnormal; Notable for the following components:   Lymphs Abs 0.5 (*)    All other components within normal limits  BRAIN NATRIURETIC PEPTIDE - Abnormal; Notable for the following components:   B Natriuretic Peptide 260.1 (*)    All other components within normal limits  SARS CORONAVIRUS 2 BY RT PCR [facility] ORDER,  [place] LAB)  URINALYSIS, ROUTINE W REFLEX MICROSCOPIC  COMPREHENSIVE METABOLIC PANEL   CBC    EKG EKG Interpretation  Date/Time:  Friday May 22 2020 16:51:59 EDT Ventricular Rate:  103 PR Interval:    QRS Duration: 86 QT Interval:  347 QTC Calculation: 455 R Axis:   81 Text Interpretation: Sinus tachycardia Borderline right axis deviation Low voltage, precordial leads No STEMI Confirmed by Octaviano Glow [phone]) on 05/22/2020 4:57:03 PM   Radiology DG Chest Portable 1 View  Result Date: 05/22/2020 CLINICAL DATA:  Worsening cough. EXAM: PORTABLE CHEST 1 VIEW COMPARISON:  February 06, 2018 FINDINGS: The lungs are hyperinflated. Mild, diffuse, chronic appearing increased lung markings are seen. Mild linear scarring and/or atelectasis is again seen along the lateral aspect of the mid right lung. There is marked severity opacification of the left lung base. A moderate sized left-sided pleural effusion is noted. A mild-to-moderate amount of left apical pleural fluid is also seen. No pneumothorax is identified. The heart size and mediastinal contours are within normal limits. The visualized skeletal structures are unremarkable. IMPRESSION: 1. Marked severity opacification of the left lung base, likely consistent with atelectasis and/or infiltrate. 2. Moderate sized left pleural effusion with mild to moderate amount of left apical pleural fluid. 3. Mild, diffuse, chronic appearing increased lung markings. Electronically Signed   By: Virgina Norfolk M.D.   On: 05/22/2020 17:53    Procedures Procedures (including critical care time)  Medications Ordered in ED Medications  enoxaparin (LOVENOX) injection 40 mg (has no administration in time range)  0.9 %  sodium chloride infusion ( Intravenous New Bag/Given 05/22/20 2301)  acetaminophen (TYLENOL) tablet 650 mg (650 mg Oral Given 05/23/20 0022)    Or  acetaminophen (TYLENOL) suppository 650 mg ( Rectal See Alternative 05/23/20 0022)  ondansetron (ZOFRAN) tablet 4 mg (has no administration in time range)    Or  ondansetron (ZOFRAN)  injection 4 mg (has no administration in time range)  levofloxacin (LEVAQUIN) IVPB 750 mg (has no administration in time range)  ondansetron (ZOFRAN) injection 4 mg (4 mg Intravenous Given 05/22/20 1806)    ED Course  I have reviewed the triage vital signs and the nursing notes.  Pertinent labs & imaging results that were available during my care of the patient were reviewed by me and considered in my medical decision making (see chart for details).  86 yo female here with concern for symptomatic hyponatremia, drop in Na to 120 from 137 one month ago.  Unclear etiology.  Possibly medication induced?  No evidence of seizures recently.  Mildly tachypneic on exam.  Xray demonstrated LLL focal lung collapse or consolidation noted on prior imaging.  No clear evidence of PNA at this time.    Covid test negative here.  Plan to admit.  BNP pending as well.  Patient stable on 2L [place] at the time of my admission.  Clinical Course as of May 24 107  Fri May 22, 2020  1747 Sodium(!): 120 [MT]  1835 Signed out to dr Jonelle Sidle hospitalist   [MT]    Clinical Course User Index [MT] Wyvonnia Dusky, MD   Final Clinical Impression(s) / ED Diagnoses Final diagnoses:  Hyponatremia    Rx / DC Orders ED Discharge Orders    None       Wyvonnia Dusky, MD 05/23/20 [phone]

## 2020-05-22 NOTE — Telephone Encounter (Signed)
Clinic nurse called stating pt has some congestion that started 3 days ago and wanted to call to see if something could be prescribed. Advised pt and clinic nurse that PCP can be called to prescribe something for congestion. Clinic nurse and pt verbalized understanding

## 2020-05-23 ENCOUNTER — Encounter (HOSPITAL_COMMUNITY): Payer: Self-pay | Admitting: Internal Medicine

## 2020-05-23 DIAGNOSIS — C3491 Malignant neoplasm of unspecified part of right bronchus or lung: Secondary | ICD-10-CM

## 2020-05-23 DIAGNOSIS — R948 Abnormal results of function studies of other organs and systems: Secondary | ICD-10-CM

## 2020-05-23 DIAGNOSIS — J90 Pleural effusion, not elsewhere classified: Secondary | ICD-10-CM

## 2020-05-23 LAB — COMPREHENSIVE METABOLIC PANEL
ALT: 41 U/L (ref 0–44)
ALT: 46 U/L — ABNORMAL HIGH (ref 0–44)
AST: 33 U/L (ref 15–41)
AST: 37 U/L (ref 15–41)
Albumin: 3.4 g/dL — ABNORMAL LOW (ref 3.5–5.0)
Albumin: 3.8 g/dL (ref 3.5–5.0)
Alkaline Phosphatase: 73 U/L (ref 38–126)
Alkaline Phosphatase: 79 U/L (ref 38–126)
Anion gap: 10 (ref 5–15)
Anion gap: 7 (ref 5–15)
BUN: 7 mg/dL — ABNORMAL LOW (ref 8–23)
BUN: 8 mg/dL (ref 8–23)
CO2: 23 mmol/L (ref 22–32)
CO2: 27 mmol/L (ref 22–32)
Calcium: 8.3 mg/dL — ABNORMAL LOW (ref 8.9–10.3)
Calcium: 8.6 mg/dL — ABNORMAL LOW (ref 8.9–10.3)
Chloride: 89 mmol/L — ABNORMAL LOW (ref 98–111)
Chloride: 88 mmol/L — ABNORMAL LOW (ref 98–111)
Creatinine, Ser: 0.62 mg/dL (ref 0.44–1.00)
Creatinine, Ser: 0.65 mg/dL (ref 0.44–1.00)
GFR calc Af Amer: >60 mL/min (ref >60)
GFR calc Af Amer: >60 mL/min (ref >60)
GFR calc non Af Amer: >60 mL/min (ref >60)
GFR calc non Af Amer: >60 mL/min (ref >60)
Glucose, Bld: 122 mg/dL — ABNORMAL HIGH (ref 70–99)
Glucose, Bld: 143 mg/dL — ABNORMAL HIGH (ref 70–99)
Potassium: 4.4 mmol/L (ref 3.5–5.1)
Potassium: 4.5 mmol/L (ref 3.5–5.1)
Sodium: 122 mmol/L — ABNORMAL LOW (ref 135–145)
Sodium: 122 mmol/L — ABNORMAL LOW (ref 135–145)
Total Bilirubin: 0.8 mg/dL (ref 0.3–1.2)
Total Bilirubin: 0.9 mg/dL (ref 0.3–1.2)
Total Protein: 6 g/dL — ABNORMAL LOW (ref 6.5–8.1)
Total Protein: 6.6 g/dL (ref 6.5–8.1)

## 2020-05-23 LAB — BASIC METABOLIC PANEL
Anion gap: 7 (ref 5–15)
Anion gap: 8 (ref 5–15)
BUN: 9 mg/dL (ref 8–23)
BUN: 9 mg/dL (ref 8–23)
CO2: 26 mmol/L (ref 22–32)
CO2: 25 mmol/L (ref 22–32)
Calcium: 8.5 mg/dL — ABNORMAL LOW (ref 8.9–10.3)
Calcium: 8.4 mg/dL — ABNORMAL LOW (ref 8.9–10.3)
Chloride: 88 mmol/L — ABNORMAL LOW (ref 98–111)
Chloride: 89 mmol/L — ABNORMAL LOW (ref 98–111)
Creatinine, Ser: 0.69 mg/dL (ref 0.44–1.00)
Creatinine, Ser: 0.55 mg/dL (ref 0.44–1.00)
GFR calc Af Amer: >60 mL/min (ref >60)
GFR calc Af Amer: >60 mL/min (ref >60)
GFR calc non Af Amer: >60 mL/min (ref >60)
GFR calc non Af Amer: >60 mL/min (ref >60)
Glucose, Bld: 154 mg/dL — ABNORMAL HIGH (ref 70–99)
Glucose, Bld: 124 mg/dL — ABNORMAL HIGH (ref 70–99)
Potassium: 4.7 mmol/L (ref 3.5–5.1)
Potassium: 4.5 mmol/L (ref 3.5–5.1)
Sodium: 121 mmol/L — ABNORMAL LOW (ref 135–145)
Sodium: 122 mmol/L — ABNORMAL LOW (ref 135–145)

## 2020-05-23 LAB — CBC
HCT: 33.2 % — ABNORMAL LOW (ref 36.0–46.0)
Hemoglobin: 11.1 g/dL — ABNORMAL LOW (ref 12.0–15.0)
MCH: 28.6 pg (ref 26.0–34.0)
MCHC: 33.4 g/dL (ref 30.0–36.0)
MCV: 85.6 fL (ref 80.0–100.0)
Platelets: 153 10*3/uL (ref 150–400)
RBC: 3.88 MIL/uL (ref 3.87–5.11)
RDW: 14.1 % (ref 11.5–15.5)
WBC: 5.2 10*3/uL (ref 4.0–10.5)
nRBC: 0 % (ref 0.0–0.2)

## 2020-05-23 LAB — CORTISOL: Cortisol, Plasma: 21.9 ug/dL

## 2020-05-23 LAB — SODIUM, URINE, RANDOM: Sodium, Ur: 29 mmol/L

## 2020-05-23 LAB — OSMOLALITY, URINE: Osmolality, Ur: 506 mOsm/kg (ref 300–900)

## 2020-05-23 LAB — OSMOLALITY: Osmolality: 262 mOsm/kg — ABNORMAL LOW (ref 275–295)

## 2020-05-23 LAB — TSH: TSH: 1.696 u[IU]/mL (ref 0.350–4.500)

## 2020-05-23 MED ORDER — ASPIRIN EC 81 MG PO TBEC
81.0000 mg | DELAYED_RELEASE_TABLET | Freq: Every day | ORAL | Status: DC
  Administered 2020-05-23 – 2020-05-29 (×7): 81 mg via ORAL
  Filled 2020-05-23 (×7): qty 1

## 2020-05-23 MED ORDER — SODIUM CHLORIDE 0.9 % IV SOLN: 500.0000 mL | Freq: Once | INTRAVENOUS | Status: DC

## 2020-05-23 MED ORDER — ZOLPIDEM TARTRATE 5 MG PO TABS
2.5000 mg | ORAL_TABLET | Freq: Every day | ORAL | Status: DC
  Administered 2020-05-23 – 2020-05-28 (×6): 2.5 mg via ORAL
  Filled 2020-05-23 (×6): qty 1

## 2020-05-23 MED ORDER — VITAMIN D 25 MCG (1000 UNIT) PO TABS
1000.0000 [IU] | ORAL_TABLET | Freq: Every day | ORAL | Status: DC
  Administered 2020-05-23 – 2020-05-29 (×7): 1000 [IU] via ORAL
  Filled 2020-05-23 (×7): qty 1

## 2020-05-23 MED ORDER — B COMPLEX-C PO TABS
1.0000 | ORAL_TABLET | Freq: Every day | ORAL | Status: DC
  Administered 2020-05-23 – 2020-05-29 (×7): 1 via ORAL
  Filled 2020-05-23 (×8): qty 1

## 2020-05-23 MED ORDER — LORATADINE 10 MG PO TABS
10.0000 mg | ORAL_TABLET | Freq: Every day | ORAL | Status: DC
  Administered 2020-05-23 – 2020-05-29 (×7): 10 mg via ORAL
  Filled 2020-05-23 (×7): qty 1

## 2020-05-23 MED ORDER — RISAQUAD PO CAPS
1.0000 | ORAL_CAPSULE | Freq: Every day | ORAL | Status: DC
  Administered 2020-05-23 – 2020-05-29 (×7): 1 via ORAL
  Filled 2020-05-23 (×8): qty 1

## 2020-05-23 MED ORDER — METOPROLOL TARTRATE 25 MG PO TABS
12.5000 mg | ORAL_TABLET | Freq: Two times a day (BID) | ORAL | Status: DC
  Administered 2020-05-23 – 2020-05-29 (×13): 12.5 mg via ORAL
  Filled 2020-05-23 (×13): qty 1

## 2020-05-23 MED ORDER — LIP MEDEX EX OINT: 1.0000 "application " | TOPICAL_OINTMENT | CUTANEOUS | Status: DC | PRN

## 2020-05-23 MED ORDER — BENZONATATE 100 MG PO CAPS
100.0000 mg | ORAL_CAPSULE | Freq: Three times a day (TID) | ORAL | Status: DC
  Administered 2020-05-23 – 2020-05-29 (×19): 100 mg via ORAL
  Filled 2020-05-23 (×19): qty 1

## 2020-05-23 MED ORDER — ZOLPIDEM TARTRATE 5 MG PO TABS: 2.5000 mg | ORAL_TABLET | Freq: Every evening | ORAL | Status: DC | PRN

## 2020-05-23 MED ORDER — IPRATROPIUM-ALBUTEROL 0.5-2.5 (3) MG/3ML IN SOLN
3.0000 mL | Freq: Two times a day (BID) | RESPIRATORY_TRACT | Status: DC
  Administered 2020-05-23 – 2020-05-29 (×11): 3 mL via RESPIRATORY_TRACT
  Filled 2020-05-23 (×10): qty 3

## 2020-05-23 MED ORDER — GLUCOSAMINE-CHONDROIT-MSM-C-MN PO CAPS: 1.0000 | ORAL_CAPSULE | Freq: Every day | ORAL | Status: DC

## 2020-05-23 MED ORDER — NAPHAZOLINE-PHENIRAMINE 0.025-0.3 % OP SOLN
1.0000 [drp] | OPHTHALMIC | Status: DC | PRN
  Filled 2020-05-23: qty 15

## 2020-05-23 MED ORDER — GUAIFENESIN-DM 100-10 MG/5ML PO SYRP
5.0000 mL | ORAL_SOLUTION | ORAL | Status: DC | PRN
  Administered 2020-05-23 – 2020-05-24 (×2): 5 mL via ORAL
  Filled 2020-05-23 (×2): qty 10

## 2020-05-23 MED ORDER — DOXYLAMINE SUCCINATE (SLEEP) 25 MG PO TABS
25.0000 mg | ORAL_TABLET | Freq: Every day | ORAL | Status: DC
  Administered 2020-05-25 – 2020-05-28 (×3): 25 mg via ORAL
  Filled 2020-05-23 (×7): qty 1

## 2020-05-23 MED ORDER — SODIUM CHLORIDE 0.9 % IV SOLN
500.0000 mg | INTRAVENOUS | Status: DC
  Administered 2020-05-23 – 2020-05-24 (×2): 500 mg via INTRAVENOUS
  Filled 2020-05-23 (×3): qty 500

## 2020-05-23 MED ORDER — SODIUM CHLORIDE 0.9 % IV SOLN
1.0000 g | INTRAVENOUS | Status: DC
  Administered 2020-05-23 – 2020-05-24 (×2): 1 g via INTRAVENOUS
  Filled 2020-05-23: qty 10
  Filled 2020-05-23: qty 1
  Filled 2020-05-23 (×2): qty 10

## 2020-05-23 MED ORDER — IPRATROPIUM-ALBUTEROL 0.5-2.5 (3) MG/3ML IN SOLN: 3.0000 mL | Freq: Three times a day (TID) | RESPIRATORY_TRACT | Status: DC

## 2020-05-23 MED ORDER — KETOROLAC TROMETHAMINE 15 MG/ML IJ SOLN: 15.0000 mg | Freq: Four times a day (QID) | INTRAMUSCULAR | Status: DC | PRN

## 2020-05-23 MED ORDER — GUAIFENESIN ER 600 MG PO TB12
1200.0000 mg | ORAL_TABLET | Freq: Two times a day (BID) | ORAL | Status: DC
  Administered 2020-05-23 – 2020-05-29 (×13): 1200 mg via ORAL
  Filled 2020-05-23 (×13): qty 2

## 2020-05-23 MED ORDER — ANASTROZOLE 1 MG PO TABS
0.5000 mg | ORAL_TABLET | Freq: Every day | ORAL | Status: DC
  Administered 2020-05-23 – 2020-05-29 (×7): 0.5 mg via ORAL
  Filled 2020-05-23 (×7): qty 1

## 2020-05-23 MED ORDER — METOPROLOL TARTRATE 25 MG PO TABS
12.5000 mg | ORAL_TABLET | Freq: Once | ORAL | Status: AC
  Administered 2020-05-23: 12.5 mg via ORAL
  Filled 2020-05-23: qty 1

## 2020-05-23 MED ORDER — PROCHLORPERAZINE EDISYLATE 10 MG/2ML IJ SOLN
10.0000 mg | Freq: Four times a day (QID) | INTRAMUSCULAR | Status: DC | PRN
  Administered 2020-05-23: 10 mg via INTRAVENOUS
  Filled 2020-05-23: qty 2

## 2020-05-23 MED ORDER — LEVOFLOXACIN IN D5W 750 MG/150ML IV SOLN
750.0000 mg | Freq: Every day | INTRAVENOUS | Status: DC
  Administered 2020-05-23: 750 mg via INTRAVENOUS
  Filled 2020-05-23: qty 150

## 2020-05-23 MED ORDER — POLYVINYL ALCOHOL 1.4 % OP SOLN
Freq: Every day | OPHTHALMIC | Status: DC | PRN
  Filled 2020-05-23: qty 15

## 2020-05-23 MED ORDER — PRAVASTATIN SODIUM 40 MG PO TABS
40.0000 mg | ORAL_TABLET | Freq: Every day | ORAL | Status: DC
  Administered 2020-05-23 – 2020-05-29 (×7): 40 mg via ORAL
  Filled 2020-05-23: qty 1
  Filled 2020-05-23 (×5): qty 2
  Filled 2020-05-23 (×2): qty 1

## 2020-05-23 NOTE — Progress Notes (Signed)
[facility]                                                                              Patient Demographics  [name] French, is a 86 y.o. female, DOB - July 23, 1926, KDT:[id number]  Admit date - 05/22/2020   Admitting Physician Elwyn Reach, MD  Outpatient Primary MD for the patient is Tisovec, Fransico Him, MD  Outpatient specialists:   LOS - 1  days   Medical records reviewed and are as summarized below:    Chief Complaint  Patient presents with  . hyponatremia       Brief summary   Patient is a 86 year old female with history of lung CA, breast CA, on chemotherapy, anemia of chronic disease, diverticulosis, history of meningioma, osteoporosis presented with upper respiratory symptoms in the last 3 to 4 days.  Patient was seen by her PCP and was given antibiotics.  Patient reported hacking cough with sore throat.  Chest x-ray showed no acute findings however sodium was noted to be very low and patient was sent to the hospital.  Patient also reported nausea, loss of appetite, poor p.o. intake in the last few days, mild dizziness.  She has been barely taking sips of soup. In ED, sodium was noted to be 120, chloride 85, calcium 8.7, glucose 121 O2 sats 90% on room air. BNP 260, COVID-19 test neg Chest x-ray showed opacification of the left lung base probably atelectasis versus infiltrate, moderate left-sided pleural effusion, left apical pleural fluid.   Assessment & Plan    Principal Problem: Acute hyponatremia -Patient presented with sodium of 120, sodium 137 on 04/20/2020 -Likely has some component of hypovolemia with poor p.o. intake, however could have underlying SIADH due to history of lung CA, respiratory issues -TSH 1.6, cortisol level pending, urine osmolality UNa pending  -Placed on IV fluid hydration, currently asymptomatic -Calculated serum osmolality 255, hypoosmolar hyponatremia -Recheck bmet q 4 hours, sodium 122 -Encourage oral diet, still  feeling nauseous, continue antiemetics  Active Problems:  NSCLC right lung  history of breast CA, right breast ER/PR positive, HER-2/neu negative -History of right lumpectomy -Continue management outpatient per oncology.  Currently on Herceptin, adjuvant anastrozole -Outpatient follow-up with Dr. Lindi Adie    Pleural effusion on left, possible left lower lung PNA -Per patient, having sore throat and congestion for the last 3 to 4 days PTA -On examination, has scattered rhonchi with wheezing -Continue IV Zithromax, Rocephin -Added Mucinex, duo nebs, Tessalon Perles -Per patient, she has had pleural effusion   Insomnia -Patient reports, was unable to sleep last night, placed on Ambien  History of sinus tachycardia, hypertension -Has seen Dr. Martinique outpatient, continue metoprolol  Code Status: DNR DVT Prophylaxis:  Lovenox Family Communication: Discussed all imaging results, lab results, explained to the patient    Disposition Plan:     Status is: Inpatient  Remains inpatient appropriate because:Inpatient level of care appropriate due to severity of illness   Dispo: The patient is from: Home              Anticipated d/c is to: Home  Anticipated d/c date is: 2 days              Patient currently is not medically stable to d/c.      Time Spent in minutes   35 minutes  Procedures:  None  Consultants:   None  Antimicrobials:   Anti-infectives (From admission, onward)   Start     Dose/Rate Route Frequency Ordered Stop   05/23/20 2200  azithromycin (ZITHROMAX) 500 mg in sodium chloride 0.9 % 250 mL IVPB     500 mg 250 mL/hr over 60 Minutes Intravenous Every 24 hours 05/23/20 0925     05/23/20 2000  cefTRIAXone (ROCEPHIN) 1 g in sodium chloride 0.9 % 100 mL IVPB     1 g 200 mL/hr over 30 Minutes Intravenous Every 24 hours 05/23/20 0925     05/23/20 0115  levofloxacin (LEVAQUIN) IVPB 750 mg  Status:  Discontinued     750 mg 100 mL/hr over 90 Minutes  Intravenous Daily at bedtime 05/23/20 0056 05/23/20 0923          Medications  Scheduled Meds: . acidophilus  1 capsule Oral Daily  . anastrozole  0.5 mg Oral Daily  . aspirin EC  81 mg Oral Daily  . B-complex with vitamin C  1 tablet Oral Daily  . benzonatate  100 mg Oral TID  . cholecalciferol  1,000 Units Oral Daily  . doxylamine (Sleep)  25 mg Oral QHS  . enoxaparin (LOVENOX) injection  40 mg Subcutaneous QHS  . guaiFENesin  1,200 mg Oral BID  . ipratropium-albuterol  3 mL Nebulization TID  . loratadine  10 mg Oral Daily  . metoprolol tartrate  12.5 mg Oral BID  . pravastatin  40 mg Oral Daily  . zolpidem  2.5 mg Oral QHS   Continuous Infusions: . sodium chloride 100 mL/hr at 05/22/20 2301  . sodium chloride Stopped (05/23/20 0330)  . azithromycin    . cefTRIAXone (ROCEPHIN)  IV     PRN Meds:.acetaminophen **OR** acetaminophen, guaiFENesin-dextromethorphan, ketorolac, lip balm, naphazoline-pheniramine, ondansetron **OR** ondansetron (ZOFRAN) IV, polyvinyl alcohol      Subjective:   [name] French was seen and examined today.  No acute complaints except tired, did not sleep well.  Still having some nausea, no vomiting or diarrhea this morning. Patient denies dizziness, chest pain, shortness of breath, abdominal pain.  No fevers.  Objective:   Vitals:   05/23/20 0600 05/23/20 0648 05/23/20 0800 05/23/20 0846  BP: 1'01/67 91/64 95/66 '$ 113/65  Pulse: (!) 102 (!) 102 (!) 104 100  Resp: 17 (!) '23 20 18  '$ Temp:    98 F (36.7 C)  TempSrc:    Oral  SpO2: 97% 98% 98% (!) 87%  Weight:      Height:        Intake/Output Summary (Last 24 hours) at 05/23/2020 1116 Last data filed at 05/23/2020 0423 Gross per 24 hour  Intake 147.68 ml  Output --  Net 147.68 ml     Wt Readings from Last 3 Encounters:  05/22/20 60.3 kg  05/07/20 60.3 kg  04/16/20 60.6 kg     Exam  General: Alert and oriented x 3, NAD  Cardiovascular: S1 S2 auscultated, no murmurs,  RRR  Respiratory: Scattered rhonchi with wheezing bilaterally, left > right   Gastrointestinal: Soft, nontender, nondistended, + bowel sounds  Ext: no pedal edema bilaterally  Neuro: No new deficits  Musculoskeletal: No digital cyanosis, clubbing  Skin: No rashes  Psych: Normal affect and demeanor, alert  and oriented x3    Data Reviewed:  I have personally reviewed following labs and imaging studies  Micro Results Recent Results (from the past 240 hour(s))  SARS Coronavirus 2 by RT PCR (hospital order, performed in [facility] hospital lab) Nasopharyngeal Nasopharyngeal Swab     Status: None   Collection Time: 05/22/20  5:04 PM   Specimen: Nasopharyngeal Swab  Result Value Ref Range Status   SARS Coronavirus 2 NEGATIVE NEGATIVE Final    Comment: (NOTE) SARS-CoV-2 target nucleic acids are NOT DETECTED. The SARS-CoV-2 RNA is generally detectable in upper and lower respiratory specimens during the acute phase of infection. The lowest concentration of SARS-CoV-2 viral copies this assay can detect is 250 copies / mL. A negative result does not preclude SARS-CoV-2 infection and should not be used as the sole basis for treatment or other patient management decisions.  A negative result may occur with improper specimen collection / handling, submission of specimen other than nasopharyngeal swab, presence of viral mutation(s) within the areas targeted by this assay, and inadequate number of viral copies (<250 copies / mL). A negative result must be combined with clinical observations, patient history, and epidemiological information. Fact Sheet for Patients:   StrictlyIdeas.no Fact Sheet for Healthcare Providers: BankingDealers.co.za This test is not yet approved or cleared  by the [place] FDA and has been authorized for detection and/or diagnosis of SARS-CoV-2 by FDA under an Emergency Use Authorization (EUA).  This EUA will  remain in effect (meaning this test can be used) for the duration of the COVID-19 declaration under Section 564(b)(1) of the Act, 21 U.S.C. section 360bbb-3(b)(1), unless the authorization is terminated or revoked sooner. Performed at [facility], [place] [address], [place], [place] [address]     Radiology Reports DG Chest Portable 1 View  Result Date: 05/22/2020 CLINICAL DATA:  Worsening cough. EXAM: PORTABLE CHEST 1 VIEW COMPARISON:  February 06, 2018 FINDINGS: The lungs are hyperinflated. Mild, diffuse, chronic appearing increased lung markings are seen. Mild linear scarring and/or atelectasis is again seen along the lateral aspect of the mid right lung. There is marked severity opacification of the left lung base. A moderate sized left-sided pleural effusion is noted. A mild-to-moderate amount of left apical pleural fluid is also seen. No pneumothorax is identified. The heart size and mediastinal contours are within normal limits. The visualized skeletal structures are unremarkable. IMPRESSION: 1. Marked severity opacification of the left lung base, likely consistent with atelectasis and/or infiltrate. 2. Moderate sized left pleural effusion with mild to moderate amount of left apical pleural fluid. 3. Mild, diffuse, chronic appearing increased lung markings. Electronically Signed   By: Virgina Norfolk M.D.   On: 05/22/2020 17:53    Lab Data:  CBC: Recent Labs  Lab 05/22/20 1706 05/23/20 0500  WBC 6.8 5.2  NEUTROABS 5.8  --   HGB 12.6 11.1*  HCT 37.8 33.2*  MCV 84.2 85.6  PLT 177 121   Basic Metabolic Panel: Recent Labs  Lab 05/22/20 1706 05/23/20 0500 05/23/20 0911  NA 120* 122* 122*  K 4.4 4.4 4.5  CL 85* 89* 88*  CO2 '25 23 27  '$ GLUCOSE 121* 122* 143*  BUN 9 7* 8  CREATININE 0.51 0.62 0.65  CALCIUM 8.7* 8.3* 8.6*   GFR: Estimated Creatinine Clearance: 41.2 mL/min (by C-G formula based on SCr of 0.65 mg/dL). Liver Function Tests: Recent Labs  Lab  05/23/20 0500 05/23/20 0911  AST 33 37  ALT 41 46*  ALKPHOS 73  79  BILITOT 0.8 0.9  PROT 6.0* 6.6  ALBUMIN 3.4* 3.8   No results for input(s): LIPASE, AMYLASE in the last 168 hours. No results for input(s): AMMONIA in the last 168 hours. Coagulation Profile: No results for input(s): INR, PROTIME in the last 168 hours. Cardiac Enzymes: No results for input(s): CKTOTAL, CKMB, CKMBINDEX, TROPONINI in the last 168 hours. BNP (last 3 results) No results for input(s): PROBNP in the last 8760 hours. HbA1C: No results for input(s): HGBA1C in the last 72 hours. CBG: No results for input(s): GLUCAP in the last 168 hours. Lipid Profile: No results for input(s): CHOL, HDL, LDLCALC, TRIG, CHOLHDL, LDLDIRECT in the last 72 hours. Thyroid Function Tests: Recent Labs    05/23/20 0911  TSH 1.696   Anemia Panel: No results for input(s): VITAMINB12, FOLATE, FERRITIN, TIBC, IRON, RETICCTPCT in the last 72 hours. Urine analysis:    Component Value Date/Time   COLORURINE STRAW (A) 02/06/2018 1702   APPEARANCEUR CLEAR 02/06/2018 1702   LABSPEC 1.001 (L) 02/06/2018 1702   PHURINE 7.0 02/06/2018 1702   GLUCOSEU NEGATIVE 02/06/2018 1702   HGBUR NEGATIVE 02/06/2018 1702   BILIRUBINUR NEGATIVE 02/06/2018 1702   KETONESUR 5 (A) 02/06/2018 1702   PROTEINUR NEGATIVE 02/06/2018 1702   NITRITE NEGATIVE 02/06/2018 1702   LEUKOCYTESUR NEGATIVE 02/06/2018 1702     Emrah Ariola M.D. [facility] 05/23/2020, 11:16 AM   Call night coverage person covering after 7pm

## 2020-05-23 NOTE — Progress Notes (Signed)
Pt arrived to room 1532 from ED. VS are stable. Pt is resting

## 2020-05-23 NOTE — ED Notes (Signed)
Patient requested something for headache. 650 Tylenol given

## 2020-05-24 ENCOUNTER — Encounter (HOSPITAL_COMMUNITY): Payer: Self-pay | Admitting: Internal Medicine

## 2020-05-24 ENCOUNTER — Inpatient Hospital Stay (HOSPITAL_COMMUNITY): Payer: Medicare Other

## 2020-05-24 LAB — BASIC METABOLIC PANEL
Anion gap: 5 (ref 5–15)
Anion gap: 4 — ABNORMAL LOW (ref 5–15)
BUN: 9 mg/dL (ref 8–23)
BUN: 9 mg/dL (ref 8–23)
CO2: 25 mmol/L (ref 22–32)
CO2: 27 mmol/L (ref 22–32)
Calcium: 8.3 mg/dL — ABNORMAL LOW (ref 8.9–10.3)
Calcium: 8.4 mg/dL — ABNORMAL LOW (ref 8.9–10.3)
Chloride: 91 mmol/L — ABNORMAL LOW (ref 98–111)
Chloride: 91 mmol/L — ABNORMAL LOW (ref 98–111)
Creatinine, Ser: 0.59 mg/dL (ref 0.44–1.00)
Creatinine, Ser: 0.51 mg/dL (ref 0.44–1.00)
GFR calc Af Amer: >60 mL/min (ref >60)
GFR calc Af Amer: >60 mL/min (ref >60)
GFR calc non Af Amer: >60 mL/min (ref >60)
GFR calc non Af Amer: >60 mL/min (ref >60)
Glucose, Bld: 129 mg/dL — ABNORMAL HIGH (ref 70–99)
Glucose, Bld: 126 mg/dL — ABNORMAL HIGH (ref 70–99)
Potassium: 4.6 mmol/L (ref 3.5–5.1)
Potassium: 4.7 mmol/L (ref 3.5–5.1)
Sodium: 121 mmol/L — ABNORMAL LOW (ref 135–145)
Sodium: 122 mmol/L — ABNORMAL LOW (ref 135–145)

## 2020-05-24 LAB — MRSA PCR SCREENING: MRSA by PCR: NEGATIVE

## 2020-05-24 MED ORDER — FUROSEMIDE 10 MG/ML IJ SOLN
20.0000 mg | Freq: Two times a day (BID) | INTRAMUSCULAR | Status: DC
  Administered 2020-05-24 – 2020-05-25 (×2): 20 mg via INTRAVENOUS
  Filled 2020-05-24 (×2): qty 2

## 2020-05-24 MED ORDER — SODIUM CHLORIDE 0.9 % IV SOLN: INTRAVENOUS | Status: DC

## 2020-05-24 MED ORDER — TOLVAPTAN 15 MG PO TABS
15.0000 mg | ORAL_TABLET | ORAL | Status: AC
  Administered 2020-05-24: 15 mg via ORAL
  Filled 2020-05-24: qty 1

## 2020-05-24 MED ORDER — CHLORHEXIDINE GLUCONATE CLOTH 2 % EX PADS
6.0000 | MEDICATED_PAD | Freq: Every day | CUTANEOUS | Status: DC
  Administered 2020-05-24 – 2020-05-26 (×3): 6 via TOPICAL

## 2020-05-24 NOTE — Progress Notes (Addendum)
[facility]                                                                              Patient Demographics  [name] French, is a 86 y.o. female, DOB - 1926-04-19, INO:[id number]  Admit date - 05/22/2020   Admitting Physician Elwyn Reach, MD  Outpatient Primary MD for the patient is Tisovec, Fransico Him, MD  Outpatient specialists:   LOS - 2  days   Medical records reviewed and are as summarized below:    Chief Complaint  Patient presents with  . hyponatremia       Brief summary   Patient is a 86 year old female with history of lung CA, breast CA, on chemotherapy, anemia of chronic disease, diverticulosis, history of meningioma, osteoporosis presented with upper respiratory symptoms in the last 3 to 4 days.  Patient was seen by her PCP and was given antibiotics.  Patient reported hacking cough with sore throat.  Chest x-ray showed no acute findings however sodium was noted to be very low and patient was sent to the hospital.  Patient also reported nausea, loss of appetite, poor p.o. intake in the last few days, mild dizziness.  She has been barely taking sips of soup. In ED, sodium was noted to be 120, chloride 85, calcium 8.7, glucose 121 O2 sats 90% on room air. BNP 260, COVID-19 test neg Chest x-ray showed opacification of the left lung base probably atelectasis versus infiltrate, moderate left-sided pleural effusion, left apical pleural fluid.   Assessment & Plan    Principal Problem: Acute hyponatremia -Patient presented with sodium of 120, sodium 137 on 04/20/2020 -Likely has some component of hypovolemia with poor p.o. intake, however could have underlying SIADH due to history of lung CA, respiratory issues -TSH 1.6, serum osmolality 262, TSH 1.696, cortisol level 21.9, urine osmolality 506, urine sodium 20 -Patient was placed on IV fluid hydration, due to initial hypovolemia with poor p.o. intake however no significant improvement, likely due to  SIADH - repeated labs again today, sodium trended down to 121 -DC IV fluids, will place on fluid restriction.  Discussed with nephrology, Dr. Jonnie Finner, salt tabs +lasix vs tolvaptan.  Dr. Jonnie Finner will see the patient for further recommendations Addendum: Discussed with Dr Jonnie Finner, starting on IV lasix and tolvaptan, recommended transfer to SDU until na stable and respiratory status improves  Active Problems:  NSCLC right lung  history of breast CA, right breast ER/PR positive, HER-2/neu negative -History of right lumpectomy -Continue management outpatient per oncology.  Currently on Herceptin, adjuvant anastrozole -Outpatient follow-up with Dr. Lindi Adie    Pleural effusion on left, possible left lower lung PNA -Per patient, having sore throat and congestion for the last 3 to 4 days PTA -Very congested, coarse breath sounds -Continue IV Zithromax, Rocephin, Mucinex, duo nebs, Tessalon - possible Left thoracentesis in am, if respiratory status not improving    Insomnia -Patient reports, was unable to sleep last night, placed on Ambien  History of sinus tachycardia, hypertension -Has seen Dr. Martinique outpatient, continue metoprolol  Code Status: DNR DVT Prophylaxis:  Lovenox Family Communication: Discussed all imaging results, lab results, explained to the  patient.  Called patient's daughter, unable to make contact.   Disposition Plan:     Status is: Inpatient  Remains inpatient appropriate because:Inpatient level of care appropriate due to severity of illness   Dispo: The patient is from: Home              Anticipated d/c is to: Home              Anticipated d/c date is: 2 days              Patient currently is not medically stable to d/c.      Time Spent in minutes 25 minutes  Procedures:  None  Consultants:   Nephrology, Dr. Shertz  Antimicrobials:   Anti-infectives (From admission, onward)   Start     Dose/Rate Route Frequency Ordered Stop   05/23/20 2200   azithromycin (ZITHROMAX) 500 mg in sodium chloride 0.9 % 250 mL IVPB     500 mg 250 mL/hr over 60 Minutes Intravenous Every 24 hours 05/23/20 0925     05/23/20 2000  cefTRIAXone (ROCEPHIN) 1 g in sodium chloride 0.9 % 100 mL IVPB     1 g 200 mL/hr over 30 Minutes Intravenous Every 24 hours 05/23/20 0925     05/23/20 0115  levofloxacin (LEVAQUIN) IVPB 750 mg  Status:  Discontinued     750 mg 100 mL/hr over 90 Minutes Intravenous Daily at bedtime 05/23/20 0056 05/23/20 0923         Medications  Scheduled Meds: . acidophilus  1 capsule Oral Daily  . anastrozole  0.5 mg Oral Daily  . aspirin EC  81 mg Oral Daily  . B-complex with vitamin C  1 tablet Oral Daily  . benzonatate  100 mg Oral TID  . cholecalciferol  1,000 Units Oral Daily  . doxylamine (Sleep)  25 mg Oral QHS  . enoxaparin (LOVENOX) injection  40 mg Subcutaneous QHS  . guaiFENesin  1,200 mg Oral BID  . ipratropium-albuterol  3 mL Nebulization BID  . loratadine  10 mg Oral Daily  . metoprolol tartrate  12.5 mg Oral BID  . pravastatin  40 mg Oral Daily  . zolpidem  2.5 mg Oral QHS   Continuous Infusions: . sodium chloride Stopped (05/23/20 0330)  . sodium chloride Stopped (05/24/20 1200)  . azithromycin 500 mg (05/23/20 2126)  . cefTRIAXone (ROCEPHIN)  IV 1 g (05/23/20 2040)   PRN Meds:.acetaminophen **OR** acetaminophen, guaiFENesin-dextromethorphan, ketorolac, lip balm, naphazoline-pheniramine, [DISCONTINUED] ondansetron **OR** ondansetron (ZOFRAN) IV, polyvinyl alcohol, prochlorperazine      Subjective:   [name] French was seen and examined today.  Congested, coughing but no other complaints.  No nausea vomiting or diarrhea. Patient denies dizziness, chest pain,  abdominal pain.  No fevers  Objective:   Vitals:   05/23/20 2043 05/23/20 2155 05/24/20 0550 05/24/20 0915  BP: 134/69  118/75   Pulse: (!) 114 (!) 102 (!) 106   Resp: 20  20   Temp: 98.4 F (36.9 C)  98.4 F (36.9 C)   TempSrc: Oral       SpO2: 97%  99% 97%  Weight:      Height:        Intake/Output Summary (Last 24 hours) at 05/24/2020 1353 Last data filed at 05/24/2020 0300 Gross per 24 hour  Intake 1590 ml  Output --  Net 1590 ml     Wt Readings from Last 3 Encounters:  05/22/20 60.3 kg  05/07/20 60.3 kg  04/16/20 60.6 kg     Physical Exam  General: Alert and oriented x 3, NAD  Cardiovascular: S1 S2 clear, RRR. No pedal edema b/l  Respiratory: Congested, bilateral rhonchi  Gastrointestinal: Soft, nontender, nondistended, NBS  Ext: no pedal edema bilaterally  Neuro: no new deficits  Musculoskeletal: No cyanosis, clubbing  Skin: No rashes  Psych: Normal affect and demeanor, alert and oriented x3    Data Reviewed:  I have personally reviewed following labs and imaging studies  Micro Results Recent Results (from the past 240 hour(s))  SARS Coronavirus 2 by RT PCR (hospital order, performed in [facility] hospital lab) Nasopharyngeal Nasopharyngeal Swab     Status: None   Collection Time: 05/22/20  5:04 PM   Specimen: Nasopharyngeal Swab  Result Value Ref Range Status   SARS Coronavirus 2 NEGATIVE NEGATIVE Final    Comment: (NOTE) SARS-CoV-2 target nucleic acids are NOT DETECTED. The SARS-CoV-2 RNA is generally detectable in upper and lower respiratory specimens during the acute phase of infection. The lowest concentration of SARS-CoV-2 viral copies this assay can detect is 250 copies / mL. A negative result does not preclude SARS-CoV-2 infection and should not be used as the sole basis for treatment or other patient management decisions.  A negative result may occur with improper specimen collection / handling, submission of specimen other than nasopharyngeal swab, presence of viral mutation(s) within the areas targeted by this assay, and inadequate number of viral copies (<250 copies / mL). A negative result must be combined with clinical observations, patient history, and epidemiological  information. Fact Sheet for Patients:   StrictlyIdeas.no Fact Sheet for Healthcare Providers: BankingDealers.co.za This test is not yet approved or cleared  by the [place] FDA and has been authorized for detection and/or diagnosis of SARS-CoV-2 by FDA under an Emergency Use Authorization (EUA).  This EUA will remain in effect (meaning this test can be used) for the duration of the COVID-19 declaration under Section 564(b)(1) of the Act, 21 U.S.C. section 360bbb-3(b)(1), unless the authorization is terminated or revoked sooner. Performed at [facility], [place] [address], [place], [place] [address]     Radiology Reports DG Chest Portable 1 View  Result Date: 05/22/2020 CLINICAL DATA:  Worsening cough. EXAM: PORTABLE CHEST 1 VIEW COMPARISON:  February 06, 2018 FINDINGS: The lungs are hyperinflated. Mild, diffuse, chronic appearing increased lung markings are seen. Mild linear scarring and/or atelectasis is again seen along the lateral aspect of the mid right lung. There is marked severity opacification of the left lung base. A moderate sized left-sided pleural effusion is noted. A mild-to-moderate amount of left apical pleural fluid is also seen. No pneumothorax is identified. The heart size and mediastinal contours are within normal limits. The visualized skeletal structures are unremarkable. IMPRESSION: 1. Marked severity opacification of the left lung base, likely consistent with atelectasis and/or infiltrate. 2. Moderate sized left pleural effusion with mild to moderate amount of left apical pleural fluid. 3. Mild, diffuse, chronic appearing increased lung markings. Electronically Signed   By: Virgina Norfolk M.D.   On: 05/22/2020 17:53    Lab Data:  CBC: Recent Labs  Lab 05/22/20 1706 05/23/20 0500  WBC 6.8 5.2  NEUTROABS 5.8  --   HGB 12.6 11.1*  HCT 37.8 33.2*  MCV 84.2 85.6  PLT 177 808   Basic Metabolic  Panel: Recent Labs  Lab 05/23/20 0500 05/23/20 0911 05/23/20 1303 05/23/20 1628 05/24/20 1132  NA 122* 122* 121* 122* 121*  K 4.4 4.5 4.7 4.5 4.6  CL 89*  88* 88* 89* 91*  CO2 23 27 26 25 25  GLUCOSE 122* 143* 154* 124* 129*  BUN 7* 8 9 9 9  CREATININE 0.62 0.65 0.69 0.55 0.59  CALCIUM 8.3* 8.6* 8.5* 8.4* 8.3*   GFR: Estimated Creatinine Clearance: 41.2 mL/min (by C-G formula based on SCr of 0.59 mg/dL). Liver Function Tests: Recent Labs  Lab 05/23/20 0500 05/23/20 0911  AST 33 37  ALT 41 46*  ALKPHOS 73 79  BILITOT 0.8 0.9  PROT 6.0* 6.6  ALBUMIN 3.4* 3.8   No results for input(s): LIPASE, AMYLASE in the last 168 hours. No results for input(s): AMMONIA in the last 168 hours. Coagulation Profile: No results for input(s): INR, PROTIME in the last 168 hours. Cardiac Enzymes: No results for input(s): CKTOTAL, CKMB, CKMBINDEX, TROPONINI in the last 168 hours. BNP (last 3 results) No results for input(s): PROBNP in the last 8760 hours. HbA1C: No results for input(s): HGBA1C in the last 72 hours. CBG: No results for input(s): GLUCAP in the last 168 hours. Lipid Profile: No results for input(s): CHOL, HDL, LDLCALC, TRIG, CHOLHDL, LDLDIRECT in the last 72 hours. Thyroid Function Tests: Recent Labs    05/23/20 0911  TSH 1.696   Anemia Panel: No results for input(s): VITAMINB12, FOLATE, FERRITIN, TIBC, IRON, RETICCTPCT in the last 72 hours. Urine analysis:    Component Value Date/Time   COLORURINE STRAW (A) 02/06/2018 1702   APPEARANCEUR CLEAR 02/06/2018 1702   LABSPEC 1.001 (L) 02/06/2018 1702   PHURINE 7.0 02/06/2018 1702   GLUCOSEU NEGATIVE 02/06/2018 1702   HGBUR NEGATIVE 02/06/2018 1702   BILIRUBINUR NEGATIVE 02/06/2018 1702   KETONESUR 5 (A) 02/06/2018 1702   PROTEINUR NEGATIVE 02/06/2018 1702   NITRITE NEGATIVE 02/06/2018 1702   LEUKOCYTESUR NEGATIVE 02/06/2018 1702     Ripudeep Rai M.D. [facility] 05/24/2020, 1:53 PM   Call night coverage  person covering after 7pm  

## 2020-05-24 NOTE — Consult Note (Addendum)
Renal Service Consult Note [facility]  [name] French 05/24/2020 Sol [place] Requesting Physician:  Dr Tana Coast  Reason for Consult: Hyponatremia HPI: The patient is a 86 y.o. year-old w/ hx of breast Ca and more recently NSCCa of the lung presenting w/ cold symptoms and just not feeling well. PCP labs showed Na 120 and pt sent to hospital for admission.  Here the pt got NS yest and this am and Na+ went up to 122 then down to 120.  Asked to see for hyponatremia.   Pt seen in room.  Having SOB and orthopnea, worse since admission, +coughing nonprod.  Trouble lying down.  CXR showed large L effusion, only comparison CXR is 2019 and looks worse.  Multiple chest CT's done to f/u lung Ca.    Had breast Ca 2014 rx'd w/ letrozole than bilat lumpectomy , per pt only the R lump was neoplastic. Then had recurrent R breast Ca and underwent another lumpectomy on the R sometime in 2019.    Was dx'd with non-small cell lung Ca in Nov 2018 w/ 6 cm LLL lung mass and another poss R sided lesion 2.4 cm w/ loer SUV max. She rec'd XRT only to L lung from Dec 2018- Jan 2019.  She has been followed w/ serial chest CT's, not on any chemoRx at this time.  F/b Dr Lindi Adie.    Denies any hx of CHF, renal failure , liver disease. No CP, abd pain, no voiding issues, normal UOP.  No dehydrating illness, no n/v/d.     ROS  denies CP  no joint pain   no HA  no blurry vision  no rash  no diarrhea  no nausea/ vomiting  no dysuria  no difficulty voiding  no change in urine color    Past Medical History  Past Medical History:  Diagnosis Date  . Allergic rhinitis   . Anemia    during pregnancy  . Arthritis   . Breast cancer ([place]) 05/08/13   right upper inner, invasive mammary  . Breast cancer, right breast ([place]) 04/16/2013   Underwent lumpectomy on 11/06/13. Path showed G1 ILC, 2.7 cm, neg margins, receptor+, Her2neg   . Diverticulosis   . Dyspnea    when carry heavy packages  . Dysrhythmia     Sinus Tach- on Metoprolol  . 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 Hemorrhoids   . Hx of adenomatous colonic polyps 05/2002  . Lung cancer ([place])   . Lung cancer ([place]) 10/2017  . Meningioma ([place])   . Osteoporosis   . Pneumonia     x2 last time was 2019  . Rosacea   . Skin cancer   . Tachycardia   . 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 Use of letrozole ([place])    neoadjuvant antiestrogen therapy with letrozole 2.5 mg daily x 7 monhts   Past Surgical History  Past Surgical History:  Procedure Laterality Date  . BREAST BIOPSY Left 1960   lt br bx/benign  . BREAST LUMPECTOMY Right   . BREAST LUMPECTOMY Right 02/10/2020   Procedure: RIGHT BREAST LUMPECTOMY;  Surgeon: Coralie Keens, MD;  Location: [facility];  Service: General;  Laterality: Right;  . BREAST LUMPECTOMY WITH NEEDLE LOCALIZATION Bilateral 11/06/2013   Procedure: BREAST LUMPECTOMY WITH NEEDLE LOCALIZATION;  Surgeon: Haywood Lasso, MD;  Location: [place];  Service: General;  Laterality: Bilateral;  . COLONOSCOPY    . EYE SURGERY Bilateral  both cataracts  . MOHS SURGERY Right    nose basal/squamous  . TOTAL HIP ARTHROPLASTY Left 08/31/2018   Procedure: LEFT TOTAL HIP ARTHROPLASTY ANTERIOR APPROACH;  Surgeon: Mcarthur Rossetti, MD;  Location: [facility];  Service: Orthopedics;  Laterality: Left;  Marland Kitchen VIDEO BRONCHOSCOPY WITH ENDOBRONCHIAL NAVIGATION N/A 11/01/2017   Procedure: VIDEO BRONCHOSCOPY WITH ENDOBRONCHIAL NAVIGATION;  Surgeon: [name], MD;  Location: [place];  Service: Thoracic;  Laterality: N/A;  . WRIST SURGERY  1990   lt   Family History  Family History  Problem Relation Age of Onset  . Heart disease Mother   . Prostate cancer Father   . Lung cancer Sister    Social History  reports that she quit smoking about 41 years ago. Her smoking use included cigarettes. She has a 12.50 pack-year smoking history. She has never used smokeless tobacco. She reports current alcohol  use. She reports that she does not use drugs. Allergies  Allergies  Allergen Reactions  . Other Anaphylaxis    Lobster- "years ago"  . Codeine Itching    Insomnia   . Hydrocodone Itching and Other (See Comments)    Insomnia  . Neosporin [Neomycin-Bacitracin Zn-Polymyx] Rash   Home medications Prior to Admission medications   Medication Sig Start Date End Date Taking? Authorizing Provider  anastrozole (ARIMIDEX) 1 MG tablet Take 0.5 tablets (0.5 mg total) by mouth daily. 05/07/20  Yes Nicholas Lose, MD  Artificial Tear Solution (BION TEARS OP) Place 1-2 drops into both eyes as needed (dry eyes).    Yes [provider]  aspirin 81 MG tablet Take 81 mg by mouth daily.   Yes [provider]  cetirizine (ZYRTEC) 10 MG tablet Take 10 mg daily by mouth.    Yes [provider]  Cholecalciferol (VITAMIN D-3) 1000 UNITS CAPS Take 1,000 Units daily by mouth.    Yes [provider]  doxylamine, Sleep, (UNISOM) 25 MG tablet Take 25 mg at bedtime by mouth.   Yes [provider]  Glucosamine-Chondroit-MSM-C-Mn CAPS Take 1 capsule by mouth daily.   Yes [provider]  Menthol, Topical Analgesic, (BIOFREEZE EX) Apply 1 application topically daily as needed (leg pain).   Yes [provider]  metoprolol tartrate (LOPRESSOR) 25 MG tablet TAKE ONE TABLET TWICE DAILY Patient taking differently: Take 12.5 mg by mouth 2 (two) times daily.  01/03/20  Yes Martinique, Peter M, MD  naphazoline-pheniramine (NAPHCON-A) 0.025-0.3 % ophthalmic solution Place 1-2 drops into both eyes as needed for eye irritation or allergies.    Yes [provider]  Polyethyl Glycol-Propyl Glycol (SYSTANE OP) Place 1 drop into both eyes daily as needed (dry eyes).   Yes [provider]  pravastatin (PRAVACHOL) 40 MG tablet Take 40 mg daily by mouth.  03/31/13  Yes [provider]  Probiotic Product (ALIGN) 4 MG CAPS Take 4 mg daily by mouth.   Yes  [provider]  SUPER B COMPLEX & C TABS Take 1 tablet by mouth daily. 10/17/16  Yes Nicholas Lose, MD  traMADol (ULTRAM) 50 MG tablet Take 1 tablet (50 mg total) by mouth every 6 (six) hours as needed for moderate pain or severe pain. 02/10/20  Yes Coralie Keens, MD  zolpidem (AMBIEN) 5 MG tablet Take 2.5 mg by mouth at bedtime as needed for sleep.  03/26/15  Yes [provider]  acetaminophen (TYLENOL) 500 MG tablet Take 500 mg by mouth every 6 (six) hours as needed for moderate pain or headache.  [provider]  ibuprofen (ADVIL) 200 MG tablet Take 200 mg by mouth every 6 (six) hours as needed for headache or moderate pain.    [provider]  nitroGLYCERIN (NITROSTAT) 0.4 MG SL tablet Place 1 tablet (0.4 mg total) under the tongue every 5 (five) minutes as needed for chest pain. Patient not taking: Reported on 05/22/2020 07/02/18 02/05/20  Martinique, Peter M, MD     Vitals:   05/23/20 2043 05/23/20 2155 05/24/20 0550 05/24/20 0915  BP: 134/69  118/75   Pulse: (!) 114 (!) 102 (!) 106   Resp: 20  20   Temp: 98.4 F (36.9 C)  98.4 F (36.9 C)   TempSrc: Oral     SpO2: 97%  99% 97%  Weight:      Height:       Exam Gen alert, coughing, uncomfortable No rash, cyanosis or gangrene Sclera anicteric, throat clear  No jvd or bruits Chest velcro rales L base 1/3 up, +rales R base 1/4 up, bilat +rhonchi RRR no MRG Abd soft ntnd no mass or ascites +bs GU defer MS no joint effusions or deformity Ext bilat R > L pretib and hip edema, 1-2+ mostly R leg, +mild sacral edema Neuro is alert, Ox 3 , nf    Home meds:  - aspirin 81/ metoprolol 12.5 bid/ pravastatin 40 qd/ sl ntg prn  - ibuprofen prn/ tramadol qid prn  - arimidiex 0.5 qd  - prn's/ vitamins/ supplements    CXR - large L effusion, chronic IS changes   UNa 29  Uosm 506    Assessment/ Plan: 1. Hyponatremia - in setting of what looks like volume excess on exam. No hx CHF/ liver / renal  disease. Creat wnl. Needs diuresis, will start w/ Lasix IV 20 bid and add tolvaptan 15mg  x 1 today.  Check labs about q 6 hrs. Would recommend SDU transfer until resp status more stable.  2. SOB/ vol excess - get ECHO, IV lasix, IVF's stopped earlier today 3. H/o breast Ca 2014, recur 2019- not active  4. H/o non-small cell lung cancer - sp XRT completed Jan 2019, f/b Dr Lindi Adie 5. L effusion - may be causing some of her resp symptoms, possibly consider thoracentesis if not improving w/ above      Kelly Splinter  MD 05/24/2020, 2:38 PM  Recent Labs  Lab 05/22/20 1706 05/23/20 0500  WBC 6.8 5.2  HGB 12.6 11.1*   Recent Labs  Lab 05/23/20 1628 05/24/20 1132  K 4.5 4.6  BUN 9 9  CREATININE 0.55 0.59  CALCIUM 8.4* 8.3*

## 2020-05-25 ENCOUNTER — Encounter (HOSPITAL_COMMUNITY): Payer: Self-pay | Admitting: Internal Medicine

## 2020-05-25 DIAGNOSIS — R000 Tachycardia, unspecified: Secondary | ICD-10-CM

## 2020-05-25 DIAGNOSIS — J9601 Acute respiratory failure with hypoxia: Secondary | ICD-10-CM

## 2020-05-25 LAB — SODIUM
Sodium: 132 mmol/L — ABNORMAL LOW (ref 135–145)
Sodium: 138 mmol/L (ref 135–145)
Sodium: 132 mmol/L — ABNORMAL LOW (ref 135–145)
Sodium: 131 mmol/L — ABNORMAL LOW (ref 135–145)
Sodium: 135 mmol/L (ref 135–145)

## 2020-05-25 LAB — BASIC METABOLIC PANEL
Anion gap: 8 (ref 5–15)
BUN: 9 mg/dL (ref 8–23)
CO2: 34 mmol/L — ABNORMAL HIGH (ref 22–32)
Calcium: 9.2 mg/dL (ref 8.9–10.3)
Chloride: 94 mmol/L — ABNORMAL LOW (ref 98–111)
Creatinine, Ser: 0.66 mg/dL (ref 0.44–1.00)
GFR calc Af Amer: >60 mL/min (ref >60)
GFR calc non Af Amer: >60 mL/min (ref >60)
Glucose, Bld: 109 mg/dL — ABNORMAL HIGH (ref 70–99)
Potassium: 4.1 mmol/L (ref 3.5–5.1)
Sodium: 136 mmol/L (ref 135–145)

## 2020-05-25 MED ORDER — AZITHROMYCIN 250 MG PO TABS
500.0000 mg | ORAL_TABLET | Freq: Every day | ORAL | Status: AC
  Administered 2020-05-25 – 2020-05-27 (×3): 500 mg via ORAL
  Filled 2020-05-25 (×3): qty 2

## 2020-05-25 MED ORDER — TRAZODONE HCL 50 MG PO TABS
50.0000 mg | ORAL_TABLET | Freq: Every day | ORAL | Status: DC
  Administered 2020-05-25 – 2020-05-28 (×4): 50 mg via ORAL
  Filled 2020-05-25 (×4): qty 1

## 2020-05-25 MED ORDER — DEXTROSE 5 % IV SOLN: INTRAVENOUS | Status: DC

## 2020-05-25 MED ORDER — DESMOPRESSIN ACETATE 4 MCG/ML IJ SOLN
2.0000 ug | Freq: Four times a day (QID) | INTRAMUSCULAR | Status: DC
  Administered 2020-05-25 – 2020-05-26 (×4): 2 ug via SUBCUTANEOUS
  Filled 2020-05-25 (×5): qty 1

## 2020-05-25 MED ORDER — CEFDINIR 125 MG/5ML PO SUSR
300.0000 mg | Freq: Two times a day (BID) | ORAL | Status: AC
  Administered 2020-05-25 – 2020-05-28 (×6): 300 mg via ORAL
  Filled 2020-05-25 (×2): qty 15
  Filled 2020-05-25: qty 6
  Filled 2020-05-25 (×4): qty 15

## 2020-05-25 NOTE — Progress Notes (Signed)
PHARMACIST - PHYSICIAN COMMUNICATION  CONCERNING: Antibiotic IV to Oral Route Change Policy  RECOMMENDATION: This patient is receiving azithromycin by the intravenous route.  Based on criteria approved by the Pharmacy and Therapeutics Committee, the antibiotic(s) is/are being converted to the equivalent oral dose form(s).   DESCRIPTION: These criteria include:  Patient being treated for a respiratory tract infection, urinary tract infection, cellulitis or clostridium difficile associated diarrhea if on metronidazole  The patient is not neutropenic and does not exhibit a GI malabsorption state  The patient is eating (either orally or via tube) and/or has been taking other orally administered medications for a least 24 hours  The patient is improving clinically and has a Tmax < 100.5  If you have questions about this conversion, please contact the Pharmacy Department  []  ( [phone] )  [place] []  ( [phone]  [facility] []  ( [phone]  [place] []  ( [phone] )  [facility] [x]  ( [phone] )  [facility]  

## 2020-05-25 NOTE — Progress Notes (Signed)
[facility] Progress Note   Subjective/Interval events:   I/Os yesterday 0.6 / 4950 with lasix 20 IV x2 and tolvaptan 15mg  x 1.  Last night serum sodium 132, this AM 136 and DDAVP and D5W 184mL/hr were rx'd.    Says feeling overall improved.  Still with some difficulty taking a deep breath.  No confusion, tremor.    Objective Vitals:   05/25/20 0700 05/25/20 0739 05/25/20 0800 05/25/20 1200  BP:      Pulse: (!) 116     Resp: 19     Temp:   98 F (36.7 C) 98.1 F (36.7 C)  TempSrc:   Oral Oral  SpO2: 97% 96%    Weight:      Height:       Physical Exam General: well appearing in side chair Heart: tachycardic, seems regular Lungs: dec L base, normal WOB, 95% on 1L.  I don't hear rales Abdomen: soft, nontender Extremities: no edema Neuro: oriented and conversant  Additional Objective Labs: Basic Metabolic Panel: Recent Labs  Lab 05/24/20 1132 05/24/20 1132 05/24/20 1518 05/24/20 1518 05/24/20 2309 05/25/20 0646 05/25/20 1102  NA 121*   < > 122*   < > 132* 136 138  K 4.6  --  4.7  --   --  4.1  --   CL 91*  --  91*  --   --  94*  --   CO2 25  --  27  --   --  34*  --   GLUCOSE 129*  --  126*  --   --  109*  --   BUN 9  --  9  --   --  9  --   CREATININE 0.59  --  0.51  --   --  0.66  --   CALCIUM 8.3*  --  8.4*  --   --  9.2  --    < > = values in this interval not displayed.   Liver Function Tests: Recent Labs  Lab 05/23/20 0500 05/23/20 0911  AST 33 37  ALT 41 46*  ALKPHOS 73 79  BILITOT 0.8 0.9  PROT 6.0* 6.6  ALBUMIN 3.4* 3.8   No results for input(s): LIPASE, AMYLASE in the last 168 hours. CBC: Recent Labs  Lab 05/22/20 1706 05/23/20 0500  WBC 6.8 5.2  NEUTROABS 5.8  --   HGB 12.6 11.1*  HCT 37.8 33.2*  MCV 84.2 85.6  PLT 177 153   Blood Culture No results found for: SDES, SPECREQUEST, CULT, REPTSTATUS  Cardiac Enzymes: No results for input(s): CKTOTAL, CKMB, CKMBINDEX, TROPONINI in the last 168 hours. CBG: No results  for input(s): GLUCAP in the last 168 hours. Iron Studies: No results for input(s): IRON, TIBC, TRANSFERRIN, FERRITIN in the last 72 hours. @lablastinr3 @ Studies/Results: DG CHEST PORT 1 VIEW  Result Date: 05/24/2020 CLINICAL DATA:  Increasing shortness of breath EXAM: PORTABLE CHEST 1 VIEW COMPARISON:  05/22/2020 FINDINGS: Single frontal view of the chest demonstrates persistent left pleural effusion and left lung consolidation. The right chest is clear. There is chronic central vascular congestion. No pneumothorax. No acute bony abnormalities. IMPRESSION: 1. Stable left pleural effusion and left lung consolidation. 2. Stable chronic central vascular congestion. Electronically Signed   By: Randa Ngo M.D.   On: 05/24/2020 17:59   Medications: . sodium chloride Stopped (05/23/20 0330)  . azithromycin Stopped (05/24/20 2219)  . cefTRIAXone (ROCEPHIN)  IV Stopped (05/24/20 2112)  . dextrose 125 mL/hr at 05/25/20  1140   . acidophilus  1 capsule Oral Daily  . anastrozole  0.5 mg Oral Daily  . aspirin EC  81 mg Oral Daily  . B-complex with vitamin C  1 tablet Oral Daily  . benzonatate  100 mg Oral TID  . Chlorhexidine Gluconate Cloth  6 each Topical Daily  . cholecalciferol  1,000 Units Oral Daily  . desmopressin  2 mcg Subcutaneous Q6H  . doxylamine (Sleep)  25 mg Oral QHS  . enoxaparin (LOVENOX) injection  40 mg Subcutaneous QHS  . furosemide  20 mg Intravenous Q12H  . guaiFENesin  1,200 mg Oral BID  . ipratropium-albuterol  3 mL Nebulization BID  . loratadine  10 mg Oral Daily  . metoprolol tartrate  12.5 mg Oral BID  . pravastatin  40 mg Oral Daily  . zolpidem  2.5 mg Oral QHS     Assessment/Plan: 1. Hyponatremia -  Serum sodium 122 on admission with Una 29, U osm 508, serum osm 262.  Initially she was felt to appear dry and was given a volume challenge without improvement in serum sodium which in fact went down to 121.  Yesterday on exam appeared volume overloaded and was treated  with 2 doses of loop diuretic + vaptan.  Had brisk diuresis + aquaresis with serum sodium up to 136 > 138 this AM.  Started DDAVP and D5W to lower serum sodium back down with goal of about 130 this afternoon.  Watch q4h sodium and titrate free water accordingly.   2. SOB/ vol excess - get ECHO (had in 02/2020 with grade 2 DD, EF 65%, normal R heart).  S/p brisk diuresis yesterday.  3. H/o breast Ca 2014, recur 2019- not active  4. H/o non-small cell lung cancer - sp XRT completed Jan 2019, f/b Dr Lindi Adie 5. L effusion - may be causing some of her resp symptoms, possibly consider thoracentesis if not improving w/ above  Jannifer Hick MD 05/25/2020, 1:23 PM  [facility] Pager: [phone]

## 2020-05-25 NOTE — TOC Initial Note (Signed)
Transition of Care [facility]) - Initial/Assessment Note    Patient Details  Name: [name] French MRN: [id number] Date of Birth: September 11, 1926  [facility]) CM/SW Contact:    Leeroy Cha, RN Phone Number: 05/25/2020, 9:21 AM  Clinical Narrative:                 hc of breat ca with metas to the lung, large pleural effusion, iv zithromax and rocephin Plan lives alone has an apartment, should be able to return to home.  Expected Discharge Plan: Home/Self Care Barriers to Discharge: Continued Medical Work up   Patient Goals and CMS Choice Patient states their goals for this hospitalization and ongoing recovery are:: so i can go home CMS Medicare.gov Compare Post Acute Care list provided to:: Patient    Expected Discharge Plan and Services Expected Discharge Plan: Home/Self Care   Discharge Planning Services: CM Consult   Living arrangements for the past 2 months: Apartment                                      Prior Living Arrangements/Services Living arrangements for the past 2 months: Apartment Lives with:: Self Patient language and need for interpreter reviewed:: No Do you feel safe going back to the place where you live?: Yes      Need for Family Participation in Patient Care: Yes (Comment) Care giver support system in place?: Yes (comment)   Criminal Activity/Legal Involvement Pertinent to Current Situation/Hospitalization: No - Comment as needed  Activities of Daily Living Home Assistive Devices/Equipment: None ADL Screening (condition at time of admission) Patient's cognitive ability adequate to safely complete daily activities?: Yes Is the patient deaf or have difficulty hearing?: No Does the patient have difficulty seeing, even when wearing glasses/contacts?: No Does the patient have difficulty concentrating, remembering, or making decisions?: No Patient able to express need for assistance with ADLs?: Yes Does the patient have difficulty  dressing or bathing?: No Independently performs ADLs?: Yes (appropriate for developmental age) Does the patient have difficulty walking or climbing stairs?: No Weakness of Legs: None Weakness of Arms/Hands: None  Permission Sought/Granted                  Emotional Assessment Appearance:: Appears stated age     Orientation: : Oriented to Self, Oriented to Place, Oriented to  Time, Oriented to Situation Alcohol / Substance Use: Not Applicable Psych Involvement: No (comment)  Admission diagnosis:  Hyponatremia [E87.1] Abnormal PET scan of colon [R94.8] Patient Active Problem List   Diagnosis Date Noted  . Hyponatremia 05/22/2020  . Senile osteoporosis 09/14/2018  . Sleep disturbances 09/14/2018  . History of breast cancer 09/14/2018  . ACP (advance care planning) 09/14/2018  . Status post total replacement of left hip 08/31/2018  . Unilateral primary osteoarthritis, left hip 08/06/2018  . Pleural effusion on left 07/11/2018  . Chest pain 12/25/2017  . Abnormal findings on radiological examination of gastrointestinal tract 11/16/2017  . Primary cancer of left lower lobe of lung ([place]) 11/15/2017  . Non-small cell cancer of right lung ([place]) 11/07/2017  . Lung mass 10/17/2017  . Malignant neoplasm of upper-inner quadrant of right breast in female, estrogen receptor positive ([place]) 06/10/2013  . Mass of breast, left 05/31/2013  . PERSONAL HISTORY OF COLONIC POLYPS 07/26/2010   PCP:  Haywood Pao, MD Pharmacy:   [place], [place] - [address] [place] [address] Phone: [phone] Fax: [phone]  [facility] [place], [place] - [address] [place] [address] Phone: [phone] Fax: [phone]  [facility] [address] - [place], [place] [facility] [address] AT [place] [place] [address] [place] [address] Phone: [phone] Fax: [phone]     Social Determinants of  Health (SDOH) Interventions    Readmission Risk Interventions No flowsheet data found.

## 2020-05-25 NOTE — Progress Notes (Addendum)
PROGRESS NOTE    [name] French  EOF:[id number]  DOB: 21-Dec-1925  PCP: Haywood Pao, MD Admit date:05/22/2020 86 year old female with history of lung CA, breast CA, on chemotherapy, anemia of chronic disease, diverticulosis, history of meningioma, osteoporosis presented with upper respiratory symptoms in the last 3 to 4 days. Patient was seen by her PCP and was given antibiotics. Patient reported hacking cough with sore throat. Chest x-ray showed no acute findings however sodium was noted to be very low and patient was sent to the hospital. Patient also reported nausea, loss of appetite, poor p.o. intake in the last few days, mild dizziness. She has been barely taking sips of soup. ED Course: Afebrile, sodium was noted to be 120, chloride 85, calcium 8.7, glucose 121 O2 sats 90% on room air.BNP 260, COVID-19 test neg Chest x-ray showed opacification of the left lung base probably atelectasis versus infiltrate, moderate left-sided pleural effusion, left apical pleural fluid. Hospital course: Patient admitted to [facility] for further evaluation and management. Subjective:  Patient now in SDU. States cough improved. Sitting in bedside chair. Still on 2lits o2  Objective: Vitals:   05/25/20 0700 05/25/20 0739 05/25/20 0800 05/25/20 1200  BP:      Pulse: (!) 116     Resp: 19     Temp:   98 F (36.7 C) 98.1 F (36.7 C)  TempSrc:   Oral Oral  SpO2: 97% 96%    Weight:      Height:        Intake/Output Summary (Last 24 hours) at 05/25/2020 1413 Last data filed at 05/25/2020 1354 Gross per 24 hour  Intake 600 ml  Output 4950 ml  Net -4350 ml   Filed Weights   05/22/20 1652 05/24/20 1822  Weight: 60.3 kg 65.2 kg    Physical Examination:  General exam: Appears calm and comfortable  Respiratory system:  Respiratory effort normal. Distant breath sounds, reduced air entry at left base Cardiovascular system: S1 & S2 heard, RRR. No JVD, murmurs, rubs, gallops or clicks. No pedal  edema. Gastrointestinal system: Abdomen is nondistended, soft and nontender. Normal bowel sounds heard. Central nervous system: Alert and oriented. No new focal neurological deficits. Extremities: No contractures, edema or joint deformities.  Skin: No rashes, lesions or ulcers Psychiatry: Judgement and insight appear normal. Mood & affect appropriate.   Data Reviewed: I have personally reviewed following labs and imaging studies  CBC: Recent Labs  Lab 05/22/20 1706 05/23/20 0500  WBC 6.8 5.2  NEUTROABS 5.8  --   HGB 12.6 11.1*  HCT 37.8 33.2*  MCV 84.2 85.6  PLT 177 254   Basic Metabolic Panel: Recent Labs  Lab 05/23/20 1303 05/23/20 1303 05/23/20 1628 05/23/20 1628 05/24/20 1132 05/24/20 1518 05/24/20 2309 05/25/20 0646 05/25/20 1102  NA 121*   < > 122*   < > 121* 122* 132* 136 138  K 4.7  --  4.5  --  4.6 4.7  --  4.1  --   CL 88*  --  89*  --  91* 91*  --  94*  --   CO2 26  --  25  --  25 27  --  34*  --   GLUCOSE 154*  --  124*  --  129* 126*  --  109*  --   BUN 9  --  9  --  9 9  --  9  --   CREATININE 0.69  --  0.55  --  0.59 0.51  --  0.66  --   CALCIUM 8.5*  --  8.4*  --  8.3* 8.4*  --  9.2  --    < > = values in this interval not displayed.   GFR: Estimated Creatinine Clearance: 41.2 mL/min (by C-G formula based on SCr of 0.66 mg/dL). Liver Function Tests: Recent Labs  Lab 05/23/20 0500 05/23/20 0911  AST 33 37  ALT 41 46*  ALKPHOS 73 79  BILITOT 0.8 0.9  PROT 6.0* 6.6  ALBUMIN 3.4* 3.8   No results for input(s): LIPASE, AMYLASE in the last 168 hours. No results for input(s): AMMONIA in the last 168 hours. Coagulation Profile: No results for input(s): INR, PROTIME in the last 168 hours. Cardiac Enzymes: No results for input(s): CKTOTAL, CKMB, CKMBINDEX, TROPONINI in the last 168 hours. BNP (last 3 results) No results for input(s): PROBNP in the last 8760 hours. HbA1C: No results for input(s): HGBA1C in the last 72 hours. CBG: No results for  input(s): GLUCAP in the last 168 hours. Lipid Profile: No results for input(s): CHOL, HDL, LDLCALC, TRIG, CHOLHDL, LDLDIRECT in the last 72 hours. Thyroid Function Tests: Recent Labs    05/23/20 0911  TSH 1.696   Anemia Panel: No results for input(s): VITAMINB12, FOLATE, FERRITIN, TIBC, IRON, RETICCTPCT in the last 72 hours. Sepsis Labs: No results for input(s): PROCALCITON, LATICACIDVEN in the last 168 hours.  Recent Results (from the past 240 hour(s))  SARS Coronavirus 2 by RT PCR (hospital order, performed in [facility] hospital lab) Nasopharyngeal Nasopharyngeal Swab     Status: None   Collection Time: 05/22/20  5:04 PM   Specimen: Nasopharyngeal Swab  Result Value Ref Range Status   SARS Coronavirus 2 NEGATIVE NEGATIVE Final    Comment: (NOTE) SARS-CoV-2 target nucleic acids are NOT DETECTED. The SARS-CoV-2 RNA is generally detectable in upper and lower respiratory specimens during the acute phase of infection. The lowest concentration of SARS-CoV-2 viral copies this assay can detect is 250 copies / mL. A negative result does not preclude SARS-CoV-2 infection and should not be used as the sole basis for treatment or other patient management decisions.  A negative result may occur with improper specimen collection / handling, submission of specimen other than nasopharyngeal swab, presence of viral mutation(s) within the areas targeted by this assay, and inadequate number of viral copies (<250 copies / mL). A negative result must be combined with clinical observations, patient history, and epidemiological information. Fact Sheet for Patients:   StrictlyIdeas.no Fact Sheet for Healthcare Providers: BankingDealers.co.za This test is not yet approved or cleared  by the [place] FDA and has been authorized for detection and/or diagnosis of SARS-CoV-2 by FDA under an Emergency Use Authorization (EUA).  This EUA will remain in  effect (meaning this test can be used) for the duration of the COVID-19 declaration under Section 564(b)(1) of the Act, 21 U.S.C. section 360bbb-3(b)(1), unless the authorization is terminated or revoked sooner. Performed at [facility], [place] [address] [place], [place] [address]   MRSA PCR Screening     Status: None   Collection Time: 05/24/20  5:51 PM   Specimen: Nasal Mucosa; Nasopharyngeal  Result Value Ref Range Status   MRSA by PCR NEGATIVE NEGATIVE Final    Comment:        The GeneXpert MRSA Assay (FDA approved for NASAL specimens only), is one component of a comprehensive MRSA colonization surveillance program. It is not intended to diagnose MRSA infection nor to guide or monitor treatment  for MRSA infections. Performed at [facility], [place] [address] [place], [place] [address]       Radiology Studies: DG CHEST PORT 1 VIEW  Result Date: 05/24/2020 CLINICAL DATA:  Increasing shortness of breath EXAM: PORTABLE CHEST 1 VIEW COMPARISON:  05/22/2020 FINDINGS: Single frontal view of the chest demonstrates persistent left pleural effusion and left lung consolidation. The right chest is clear. There is chronic central vascular congestion. No pneumothorax. No acute bony abnormalities. IMPRESSION: 1. Stable left pleural effusion and left lung consolidation. 2. Stable chronic central vascular congestion. Electronically Signed   By: Randa Ngo M.D.   On: 05/24/2020 17:59        Scheduled Meds: . acidophilus  1 capsule Oral Daily  . anastrozole  0.5 mg Oral Daily  . aspirin EC  81 mg Oral Daily  . B-complex with vitamin C  1 tablet Oral Daily  . benzonatate  100 mg Oral TID  . Chlorhexidine Gluconate Cloth  6 each Topical Daily  . cholecalciferol  1,000 Units Oral Daily  . desmopressin  2 mcg Subcutaneous Q6H  . doxylamine (Sleep)  25 mg Oral QHS  . enoxaparin (LOVENOX) injection  40 mg Subcutaneous QHS  . guaiFENesin  1,200 mg  Oral BID  . ipratropium-albuterol  3 mL Nebulization BID  . loratadine  10 mg Oral Daily  . metoprolol tartrate  12.5 mg Oral BID  . pravastatin  40 mg Oral Daily  . zolpidem  2.5 mg Oral QHS   Continuous Infusions: . sodium chloride Stopped (05/23/20 0330)  . azithromycin Stopped (05/24/20 2219)  . cefTRIAXone (ROCEPHIN)  IV Stopped (05/24/20 2112)  . dextrose 125 mL/hr at 05/25/20 1140     Assessment/Plan:  Acute hyponatremia-Patient presented with sodium of 120, ( last Na 137 on 5/3). Likely has some component of hypovolemia with poor p.o. 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 intake however no significant improvement, likely due to SIADH,repeat sodium trended down to 121. DCed IV fluids on 6/6, placed on fluid restriction. Seen by  nephrology, Dr. Jonnie Finner on 6/6, who started patient on IV lasix and tolvaptan, recommended transfer to SDU until Na stable and respiratory status improves. Patient had rapid correction of sodium level overnight with sodium 122->132->138. Nephrology started hypotonic fluids to reduce levels and ensure slow correction. Watch for worsening effusion  Acute hypoxic respiratory failure pleural effusion on left, possible left lower lung PNA-Per patient, was having sore throat and congestion for the last 3 to 4 days PTA,  had coarse breath sounds on exam upon admisison. recieveing IV Zithromax, Rocephin, Mucinex, duo nebs, Tessalon- cough improved but still requiring 2lits 02. Taper as tolerated. She is not on home o2 but reports chronic left pleural effusion since her breast surgery. Will repeat CXR in am and possible Left thoracentesis if still requiring o2 or effusion worsened with IVF. Change ABx to po to avoid fluid overload  NSCLC right  lung/history of breast CA, right breast : ER/PR positive, HER-2/neu negative. S/P  right lumpectomy-Currently on Herceptin, adjuvant anastrozole. Outpatient follow-up with Dr. Oran Rein reports, was unable to sleep last night, placed on Ambien  HTN/ sinus tachycardia-Has seen Dr. [place] as outpatient,continue metoprolol   DVT prophylaxis: lovenox Code Status: DNR Family / Patient Communication: d/w patient  Disposition Plan:  Status is: Inpatient  Remains inpatient appropriate because:Ongoing diagnostic testing needed not appropriate for outpatient work up. Still on O2   Dispo: The patient is from: Home  Anticipated d/c is to: Home  Anticipated d/c date is: 2 -3 days  Patient currently is not medically stable to d/c.    LOS: 3 days    Time spent: 35 minutes    [place]ilford Shi, MD [facility] Pager in [place]  If 7PM-7AM, please contact night-coverage [email or web address] 05/25/2020, 2:13 PM

## 2020-05-26 ENCOUNTER — Inpatient Hospital Stay (HOSPITAL_COMMUNITY): Payer: Medicare Other

## 2020-05-26 ENCOUNTER — Telehealth: Payer: Self-pay | Admitting: *Deleted

## 2020-05-26 DIAGNOSIS — C50911 Malignant neoplasm of unspecified site of right female breast: Secondary | ICD-10-CM

## 2020-05-26 DIAGNOSIS — C3432 Malignant neoplasm of lower lobe, left bronchus or lung: Secondary | ICD-10-CM

## 2020-05-26 LAB — BASIC METABOLIC PANEL
Anion gap: 10 (ref 5–15)
BUN: 14 mg/dL (ref 8–23)
CO2: 28 mmol/L (ref 22–32)
Calcium: 8.6 mg/dL — ABNORMAL LOW (ref 8.9–10.3)
Chloride: 92 mmol/L — ABNORMAL LOW (ref 98–111)
Creatinine, Ser: 0.5 mg/dL (ref 0.44–1.00)
GFR calc Af Amer: >60 mL/min (ref >60)
GFR calc non Af Amer: >60 mL/min (ref >60)
Glucose, Bld: 94 mg/dL (ref 70–99)
Potassium: 3.8 mmol/L (ref 3.5–5.1)
Sodium: 130 mmol/L — ABNORMAL LOW (ref 135–145)

## 2020-05-26 LAB — SODIUM: Sodium: 132 mmol/L — ABNORMAL LOW (ref 135–145)

## 2020-05-26 MED ORDER — SODIUM CHLORIDE (PF) 0.9 % IJ SOLN
INTRAMUSCULAR | Status: AC
  Filled 2020-05-26: qty 50

## 2020-05-26 MED ORDER — IOHEXOL 350 MG/ML SOLN
100.0000 mL | Freq: Once | INTRAVENOUS | Status: AC | PRN
  Administered 2020-05-26: 100 mL via INTRAVENOUS

## 2020-05-26 NOTE — Progress Notes (Signed)
[facility] Progress Note   Subjective/Interval events:   Says feeling remarkably better than admission.  Expectorating some sputum now which is helping. Ate 100% of her lunch.    Objective Vitals:   05/26/20 1345 05/26/20 1346 05/26/20 1349 05/26/20 1400  BP:      Pulse: (!) 121 (!) 122 (!) 121 (!) 121  Resp: 15 18 20  (!) 21  Temp:      TempSrc:      SpO2: 94% (!) 89% (!) 88% 98%  Weight:      Height:       Physical Exam General: well appearing in side chair Heart: tachycardic, seems regular Lungs: dec L base, normal WOB, on RA now.  I don't hear rales Abdomen: soft, nontender Extremities: no edema Neuro: oriented and conversant  Additional Objective Labs: Basic Metabolic Panel: Recent Labs  Lab 05/24/20 1518 05/24/20 2309 05/25/20 0646 05/25/20 1102 05/25/20 1830 05/25/20 2231 05/26/20 0228  NA 122*   < > 136   < > 131* 135 130*  132*  K 4.7  --  4.1  --   --   --  3.8  CL 91*  --  94*  --   --   --  92*  CO2 27  --  34*  --   --   --  28  GLUCOSE 126*  --  109*  --   --   --  94  BUN 9  --  9  --   --   --  14  CREATININE 0.51  --  0.66  --   --   --  0.50  CALCIUM 8.4*  --  9.2  --   --   --  8.6*   < > = values in this interval not displayed.   Liver Function Tests: Recent Labs  Lab 05/23/20 0500 05/23/20 0911  AST 33 37  ALT 41 46*  ALKPHOS 73 79  BILITOT 0.8 0.9  PROT 6.0* 6.6  ALBUMIN 3.4* 3.8   No results for input(s): LIPASE, AMYLASE in the last 168 hours. CBC: Recent Labs  Lab 05/22/20 1706 05/23/20 0500  WBC 6.8 5.2  NEUTROABS 5.8  --   HGB 12.6 11.1*  HCT 37.8 33.2*  MCV 84.2 85.6  PLT 177 153   Blood Culture No results found for: SDES, SPECREQUEST, CULT, REPTSTATUS  Cardiac Enzymes: No results for input(s): CKTOTAL, CKMB, CKMBINDEX, TROPONINI in the last 168 hours. CBG: No results for input(s): GLUCAP in the last 168 hours. Iron Studies: No results for input(s): IRON, TIBC, TRANSFERRIN, FERRITIN in the last  72 hours. @lablastinr3 @ Studies/Results: DG CHEST PORT 1 VIEW  Result Date: 05/24/2020 CLINICAL DATA:  Increasing shortness of breath EXAM: PORTABLE CHEST 1 VIEW COMPARISON:  05/22/2020 FINDINGS: Single frontal view of the chest demonstrates persistent left pleural effusion and left lung consolidation. The right chest is clear. There is chronic central vascular congestion. No pneumothorax. No acute bony abnormalities. IMPRESSION: 1. Stable left pleural effusion and left lung consolidation. 2. Stable chronic central vascular congestion. Electronically Signed   By: Randa Ngo M.D.   On: 05/24/2020 17:59   Medications:  . acidophilus  1 capsule Oral Daily  . anastrozole  0.5 mg Oral Daily  . aspirin EC  81 mg Oral Daily  . azithromycin  500 mg Oral QHS  . B-complex with vitamin C  1 tablet Oral Daily  . benzonatate  100 mg Oral TID  . cefdinir  300 mg  Oral BID  . Chlorhexidine Gluconate Cloth  6 each Topical Daily  . cholecalciferol  1,000 Units Oral Daily  . doxylamine (Sleep)  25 mg Oral QHS  . enoxaparin (LOVENOX) injection  40 mg Subcutaneous QHS  . guaiFENesin  1,200 mg Oral BID  . ipratropium-albuterol  3 mL Nebulization BID  . loratadine  10 mg Oral Daily  . metoprolol tartrate  12.5 mg Oral BID  . pravastatin  40 mg Oral Daily  . traZODone  50 mg Oral QHS  . zolpidem  2.5 mg Oral QHS     Assessment/Plan: 1. Hyponatremia -  Serum sodium 122 on admission with Una 29, U osm 508, serum osm 262.  Initially she was felt to appear dry and was given a volume challenge without improvement in serum sodium which in fact went down to 121.  At that point she appeared volume overloaded and was treated with loop diuretic  + vaptan.  Had brisk diuresis + aquaresis with serum sodium up to 136 > 138 this AM and required DDAVP and free water to lower Na back down to 130.  Free water stopped for concern for worsening effusion yesterday PM.  As of this AM sodium 130-132 and I have stopped DDAVP.   Check serum sodium in AM - I suspect it'll stay stable.  2. SOB/ vol excess - get ECHO (had in 02/2020 with grade 2 DD, EF 65%, normal R heart).  Improved. 3. H/o breast Ca 2014, recur 2019- not active  4. H/o non-small cell lung cancer - sp XRT completed Jan 2019, f/b Dr Lindi Adie 5. L effusion - may be causing some of her resp symptoms, do not think she needs thoracentesis at this time, defer to primary.  Jannifer Hick MD 05/26/2020, 2:35 PM  [facility] Pager: [phone]

## 2020-05-26 NOTE — Progress Notes (Signed)
   05/26/20 2326  Assess: MEWS Score  Temp 98.1 F (36.7 C)  BP (!) 85/55  Pulse Rate (!) 106  Resp 16  SpO2 92 %  O2 Device Nasal Cannula  O2 Flow Rate (L/min) 2 L/min  Assess: MEWS Score  MEWS Temp 0  MEWS Systolic 1  MEWS Pulse 1  MEWS RR 0  MEWS LOC 0  MEWS Score 2  MEWS Score Color Yellow  Assess: if the MEWS score is Yellow or Red  Were vital signs taken at a resting state? Yes  Focused Assessment Documented focused assessment  Early Detection of Sepsis Score *See Row Information* Low  MEWS guidelines implemented *See Row Information* Yes  Treat  MEWS Interventions Escalated (See documentation below)  Take Vital Signs  Increase Vital Sign Frequency  Yellow: Q 2hr X 2 then Q 4hr X 2, if remains yellow, continue Q 4hrs  Escalate  MEWS: Escalate Yellow: discuss with charge nurse/RN and consider discussing with provider and RRT  Notify: Charge Nurse/RN  Name of Charge Nurse/RN Notified Sharyn Lull  Date Charge Nurse/RN Notified 05/26/20  Time Charge Nurse/RN Notified 2330  Notify: Provider  Provider Name/Title Hal Hope  Date Provider Notified 05/26/20  Time Provider Notified 2330  Notification Type Page  Notification Reason Change in status (low BP)  Response See new orders  Date of Provider Response 05/26/20  Time of Provider Response 2336   Order for STAT lactic acid placed. Awaiting results for further interventions.

## 2020-05-26 NOTE — Telephone Encounter (Signed)
[name] French called to say she is currently hospitalized. Wants to know if she should keep her appt for herceptin on Thursday if discharged. Dr Lindi Adie states it is OK to skip this weeks treatment. Plan to come for next scheduled appt 6/29. Pt verbalized understanding

## 2020-05-26 NOTE — Progress Notes (Signed)
PROGRESS NOTE    [name] French  ZOX:[id number]  DOB: [birth year]  PCP: Haywood Pao, MD Admit date:05/22/2020 86 year old female with history of lung CA, breast CA, on chemotherapy, anemia of chronic disease, diverticulosis, history of meningioma, osteoporosis presented with upper respiratory symptoms in the last 3 to 4 days. Patient was seen by her PCP and was given antibiotics. Patient reported hacking cough with sore throat. Chest x-ray showed no acute findings however sodium was noted to be very low and patient was sent to the hospital. Patient also reported nausea, loss of appetite, poor p.o. intake in the last few days, mild dizziness. She has been barely taking sips of soup. ED Course: Afebrile, sodium was noted to be 120, chloride 85, calcium 8.7, glucose 121 O2 sats 90% on room air.BNP 260, COVID-19 test neg Chest x-ray showed opacification of the left lung base probably atelectasis versus infiltrate, moderate left-sided pleural effusion, left apical pleural fluid. Hospital course: Patient admitted to [facility] for further evaluation and management.  Hospital course complicated by rapid correction of sodium with intervention, new O2 requirement, CT showing new right pleural effusion Subjective:  Patient sitting in bedside chair. Still on 2lits o2.  Did not tolerate tapering attempt earlier today with drop in sats to 89%.  Objective: Vitals:   05/26/20 1400 05/26/20 1500 05/26/20 1700 05/26/20 1829  BP:    121/74  Pulse: (!) 121 (!) 125  (!) 122  Resp: (!) 21 (!) 22  17  Temp:   98.1 F (36.7 C) 98.6 F (37 C)  TempSrc:   Oral Oral  SpO2: 98% 100%  100%  Weight:      Height:        Intake/Output Summary (Last 24 hours) at 05/26/2020 1916 Last data filed at 05/26/2020 1200 Gross per 24 hour  Intake 540 ml  Output 450 ml  Net 90 ml   Filed Weights   05/22/20 1652 05/24/20 1822  Weight: 60.3 kg 65.2 kg    Physical Examination:  General exam: Appears calm and  comfortable  Respiratory system:  Respiratory effort normal. Distant breath sounds, reduced air entry at bilateral lung bases right greater (mid lung) than left with bronchial sounds Cardiovascular system: S1 & S2 heard, RRR. No JVD, murmurs, rubs, gallops or clicks. No pedal edema. Gastrointestinal system: Abdomen is nondistended, soft and nontender. Normal bowel sounds heard. Central nervous system: Alert and oriented. No new focal neurological deficits. Extremities: No contractures, edema or joint deformities.  Skin: No rashes, lesions or ulcers Psychiatry: Judgement and insight appear normal. Mood & affect appropriate.   Data Reviewed: I have personally reviewed following labs and imaging studies  CBC: Recent Labs  Lab 05/22/20 1706 05/23/20 0500  WBC 6.8 5.2  NEUTROABS 5.8  --   HGB 12.6 11.1*  HCT 37.8 33.2*  MCV 84.2 85.6  PLT 177 811   Basic Metabolic Panel: Recent Labs  Lab 05/23/20 1628 05/23/20 1628 05/24/20 1132 05/24/20 1132 05/24/20 1518 05/24/20 2309 05/25/20 0646 05/25/20 0646 05/25/20 1102 05/25/20 1439 05/25/20 1830 05/25/20 2231 05/26/20 0228  NA 122*   < > 121*   < > 122*   < > 136   < > 138 132* 131* 135 130*  132*  K 4.5  --  4.6  --  4.7  --  4.1  --   --   --   --   --  3.8  CL 89*  --  91*  --  91*  --  94*  --   --   --   --   --  92*  CO2 25  --  25  --  27  --  34*  --   --   --   --   --  28  GLUCOSE 124*  --  129*  --  126*  --  109*  --   --   --   --   --  94  BUN 9  --  9  --  9  --  9  --   --   --   --   --  14  CREATININE 0.55  --  0.59  --  0.51  --  0.66  --   --   --   --   --  0.50  CALCIUM 8.4*  --  8.3*  --  8.4*  --  9.2  --   --   --   --   --  8.6*   < > = values in this interval not displayed.   GFR: Estimated Creatinine Clearance: 41.2 mL/min (by C-G formula based on SCr of 0.5 mg/dL). Liver Function Tests: Recent Labs  Lab 05/23/20 0500 05/23/20 0911  AST 33 37  ALT 41 46*  ALKPHOS 73 79  BILITOT 0.8 0.9    PROT 6.0* 6.6  ALBUMIN 3.4* 3.8   No results for input(s): LIPASE, AMYLASE in the last 168 hours. No results for input(s): AMMONIA in the last 168 hours. Coagulation Profile: No results for input(s): INR, PROTIME in the last 168 hours. Cardiac Enzymes: No results for input(s): CKTOTAL, CKMB, CKMBINDEX, TROPONINI in the last 168 hours. BNP (last 3 results) No results for input(s): PROBNP in the last 8760 hours. HbA1C: No results for input(s): HGBA1C in the last 72 hours. CBG: No results for input(s): GLUCAP in the last 168 hours. Lipid Profile: No results for input(s): CHOL, HDL, LDLCALC, TRIG, CHOLHDL, LDLDIRECT in the last 72 hours. Thyroid Function Tests: No results for input(s): TSH, T4TOTAL, FREET4, T3FREE, THYROIDAB in the last 72 hours. Anemia Panel: No results for input(s): VITAMINB12, FOLATE, FERRITIN, TIBC, IRON, RETICCTPCT in the last 72 hours. Sepsis Labs: No results for input(s): PROCALCITON, LATICACIDVEN in the last 168 hours.  Recent Results (from the past 240 hour(s))  SARS Coronavirus 2 by RT PCR (hospital order, performed in [facility] hospital lab) Nasopharyngeal Nasopharyngeal Swab     Status: None   Collection Time: 05/22/20  5:04 PM   Specimen: Nasopharyngeal Swab  Result Value Ref Range Status   SARS Coronavirus 2 NEGATIVE NEGATIVE Final    Comment: (NOTE) SARS-CoV-2 target nucleic acids are NOT DETECTED. The SARS-CoV-2 RNA is generally detectable in upper and lower respiratory specimens during the acute phase of infection. The lowest concentration of SARS-CoV-2 viral copies this assay can detect is 250 copies / mL. A negative result does not preclude SARS-CoV-2 infection and should not be used as the sole basis for treatment or other patient management decisions.  A negative result may occur with improper specimen collection / handling, submission of specimen other than nasopharyngeal swab, presence of viral mutation(s) within the areas targeted by  this assay, and inadequate number of viral copies (<250 copies / mL). A negative result must be combined with clinical observations, patient history, and epidemiological information. Fact Sheet for Patients:   StrictlyIdeas.no Fact Sheet for Healthcare Providers: BankingDealers.co.za This test is not yet approved or cleared  by the [place] FDA and  has been authorized for detection and/or diagnosis of SARS-CoV-2 by FDA under an Emergency Use Authorization (EUA).  This EUA will remain in effect (meaning this test can be used) for the duration of the COVID-19 declaration under Section 564(b)(1) of the Act, 21 U.S.C. section 360bbb-3(b)(1), unless the authorization is terminated or revoked sooner. Performed at [facility], [place] [address], [place], [place] [address]   MRSA PCR Screening     Status: None   Collection Time: 05/24/20  5:51 PM   Specimen: Nasal Mucosa; Nasopharyngeal  Result Value Ref Range Status   MRSA by PCR NEGATIVE NEGATIVE Final    Comment:        The GeneXpert MRSA Assay (FDA approved for NASAL specimens only), is one component of a comprehensive MRSA colonization surveillance program. It is not intended to diagnose MRSA infection nor to guide or monitor treatment for MRSA infections. Performed at [facility], [place] [address], [place], [place] [address]       Radiology Studies: CT ANGIO CHEST PE W OR WO CONTRAST  Result Date: 05/26/2020 CLINICAL DATA:  86 year old with respiratory failure. Lung cancer status post radiation. New oxygen requirement. Shortness of breath. Pleural effusion versus pulmonary embolus. EXAM: CT ANGIOGRAPHY CHEST WITH CONTRAST TECHNIQUE: Multidetector CT imaging of the chest was performed using the standard protocol during bolus administration of intravenous contrast. Multiplanar CT image reconstructions and MIPs were obtained to evaluate the  vascular anatomy. CONTRAST:  156m OMNIPAQUE IOHEXOL 350 MG/ML SOLN COMPARISON:  Chest radiograph 05/24/2020. Most recent chest CT 04/20/2020 FINDINGS: Cardiovascular: No discrete filling defects in the pulmonary arteries to suggest pulmonary embolus. The left lower lobe pulmonary arteries are atretic but no filling defects are seen. Aortic atherosclerosis without aortic aneurysm. No aortic dissection. Upper normal heart size. Small pericardial effusion is unchanged. Mediastinum/Nodes: AP window lymph node measures 8 mm, series 4, image 41, previously 6 mm. Small lower paratracheal nodes, largest measuring 7 mm. No evidence of hilar adenopathy. No esophageal wall thickening. No thyroid nodule. Lungs/Pleura: Progressive narrowing with filling of the left lower lobe bronchus, increased bronchial filling from prior. Complete collapse of the left lower lobe again seen. Increased narrowing and short segment occlusion of the lingular bronchus with associated soft tissue density. Left upper lobe bronchus is patent. 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 Right pleural effusion, moderate in size, new from prior exam. No definite pleural thickening or enhancement. Adjacent compressive atelectasis. Chronic volume loss and nodular opacities abutting the right minor fissure, series 10 image 67, unchanged. Stable 5 mm pleural-based nodule in the right middle lobe, series 10, image 84. Mild mosaic pulmonary parenchymal attenuation is again seen. Upper Abdomen: No acute findings. Musculoskeletal: No acute osseous abnormality. No evidence of focal bone lesion. Review of the MIP images confirms the above findings. IMPRESSION: 1. No pulmonary embolus. 2. Right pleural effusion, moderate in size, new from prior exam. No definite pleural thickening or enhancement. 3. Progressive left lower lobe bronchial narrowing, with progressive bronchial filling. Increased narrowing of the lingular  bronchus with short segment occlusion. 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 Complete left lower lobe collapse/consolidation, unchanged. 4. Small mediastinal lymph nodes remain subcentimeter but slightly increased from prior exam, potentially reactive. Aortic Atherosclerosis (ICD10-I70.0). Electronically Signed   By: MKeith RakeM.D.   On: 05/26/2020 18:23        Scheduled Meds: . acidophilus  1 capsule Oral Daily  . anastrozole  0.5 mg  Oral Daily  . aspirin EC  81 mg Oral Daily  . azithromycin  500 mg Oral QHS  . B-complex with vitamin C  1 tablet Oral Daily  . benzonatate  100 mg Oral TID  . cefdinir  300 mg Oral BID  . Chlorhexidine Gluconate Cloth  6 each Topical Daily  . cholecalciferol  1,000 Units Oral Daily  . doxylamine (Sleep)  25 mg Oral QHS  . enoxaparin (LOVENOX) injection  40 mg Subcutaneous QHS  . guaiFENesin  1,200 mg Oral BID  . ipratropium-albuterol  3 mL Nebulization BID  . loratadine  10 mg Oral Daily  . metoprolol tartrate  12.5 mg Oral BID  . pravastatin  40 mg Oral Daily  . sodium chloride (PF)      . traZODone  50 mg Oral QHS  . zolpidem  2.5 mg Oral QHS   Continuous Infusions:    Assessment/Plan:  Acute hyponatremia-Patient presented with sodium of 120, ( last Na 137 on 5/3). Likely has some component of hypovolemia with poor p.o. 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 intake however no significant improvement, likely due to SIADH,repeat sodium trended down to 121. DCed IV fluids on 6/6, placed on fluid restriction. Seen by  nephrology, Dr. Jonnie Finner on 6/6, who started patient on IV lasix and tolvaptan, recommended transfer to SDU until Na stable and respiratory status improves.  Patient had rapid correction of sodium level overnight with sodium 122->132->138. Nephrology started hypotonic fluids briefly to reduce levels and ensure slow correction-discontinued later in concern for worsening effusion.  Sodium level at 130 today.  Appreciate nephrology input and follow-up.  Acute hypoxic respiratory failure, bilateral pleural effusions.  Patient admitted with IV Zithromax, Rocephin for possible left-sided pneumonia.  Her symptoms of cough improved with Mucinex, duo nebs, Tessalon-  but still requiring 2lits 02.  Attempted taper as tolerated. She is not on home o2 but reports chronic left pleural effusion since her breast surgery.  CT chest obtained today which shows "right pleural effusion, moderate in size, new from prior exam.  Progressive left lower lobe bronchial narrowing, with progressive bronchial filling. Increased narrowing of the lingular bronchus with short segment occlusion. 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 Complete left lower lobe collapse/consolidation, unchanged."  Patient already on antibiotics that should cover bronchitis/bronchiolitis.  Does not appear to have pneumonia on CT.  Will consult IR for right thoracentesis in a.m.  Hopefully can come off O2 after that.  Breast CA, right breast : ER/PR positive, HER-2/neu negative. S/P  right lumpectomy in 2019-Currently on chemo-Herceptin as well as adjuvant anastrozole.  Can follow-up with Dr. Oretha Caprice request him via secure chat to review CT.  Discussed briefly with oncology APP today.  History of non-small cell lung cancer: According to previous oncology notes patient diagnosed with left lower lobe stage IIb and right middle lobe T2N0 lung CA for which she received radiation therapy until January 2019.  CT chest obtained today which revealed chronic left-sided fibrothorax/collapse/pleural effusion/pleural thickening.  Not sure if CT findings of bronchial filling  concerning for malignancy versus infection.  No obvious pulmonary malignancy reported.  Insomnia-Patient reports, was unable to sleep last night, placed on Ambien  HTN/ sinus tachycardia-Has seen Dr. Martinique as outpatient,continue metoprolol   DVT prophylaxis: lovenox-hold for thoracentesis in  a.m. Code Status: DNR Family / Patient Communication: d/w patient  Disposition Plan:   Status is: Inpatient  Remains inpatient appropriate because:Ongoing diagnostic testing needed not appropriate for outpatient work up. Still on O2   Dispo: The patient is from: Home  Anticipated d/c is to: Home  Anticipated d/c date is: 2 -3 days  Patient currently is not medically stable to d/c.    LOS: 4 days    Time spent: 35 minutes    Guilford Shi, MD [facility] Pager in [place]  If 7PM-7AM, please contact night-coverage [email or web address] 05/26/2020, 7:16 PM

## 2020-05-27 ENCOUNTER — Ambulatory Visit: Payer: Self-pay | Admitting: Urology

## 2020-05-27 ENCOUNTER — Inpatient Hospital Stay (HOSPITAL_COMMUNITY): Payer: Medicare Other

## 2020-05-27 LAB — BASIC METABOLIC PANEL
Anion gap: 8 (ref 5–15)
BUN: 15 mg/dL (ref 8–23)
CO2: 29 mmol/L (ref 22–32)
Calcium: 8.6 mg/dL — ABNORMAL LOW (ref 8.9–10.3)
Chloride: 90 mmol/L — ABNORMAL LOW (ref 98–111)
Creatinine, Ser: 0.59 mg/dL (ref 0.44–1.00)
GFR calc Af Amer: >60 mL/min (ref >60)
GFR calc non Af Amer: >60 mL/min (ref >60)
Glucose, Bld: 132 mg/dL — ABNORMAL HIGH (ref 70–99)
Potassium: 3.8 mmol/L (ref 3.5–5.1)
Sodium: 127 mmol/L — ABNORMAL LOW (ref 135–145)

## 2020-05-27 LAB — BODY FLUID CELL COUNT WITH DIFFERENTIAL
Eos, Fluid: 1 %
Lymphs, Fluid: 68 %
Monocyte-Macrophage-Serous Fluid: 16 % — ABNORMAL LOW (ref 50–90)
Neutrophil Count, Fluid: 15 % (ref 0–25)
Total Nucleated Cell Count, Fluid: 616 cu mm (ref 0–1000)

## 2020-05-27 LAB — LACTIC ACID, PLASMA: Lactic Acid, Venous: 1.1 mmol/L (ref 0.5–1.9)

## 2020-05-27 LAB — OSMOLALITY, URINE: Osmolality, Ur: 419 mOsm/kg (ref 300–900)

## 2020-05-27 LAB — LACTATE DEHYDROGENASE, PLEURAL OR PERITONEAL FLUID: LD, Fluid: 55 U/L — ABNORMAL HIGH (ref 3–23)

## 2020-05-27 LAB — PROTEIN, PLEURAL OR PERITONEAL FLUID: Total protein, fluid: <3 g/dL

## 2020-05-27 LAB — ALBUMIN, PLEURAL OR PERITONEAL FLUID: Albumin, Fluid: 1.4 g/dL

## 2020-05-27 LAB — OSMOLALITY: Osmolality: 272 mOsm/kg — ABNORMAL LOW (ref 275–295)

## 2020-05-27 LAB — SODIUM, URINE, RANDOM: Sodium, Ur: 41 mmol/L

## 2020-05-27 MED ORDER — LIDOCAINE HCL 1 % IJ SOLN
INTRAMUSCULAR | Status: AC
  Filled 2020-05-27: qty 20

## 2020-05-27 MED ORDER — POLYETHYLENE GLYCOL 3350 17 G PO PACK
17.0000 g | PACK | Freq: Every day | ORAL | Status: DC
  Administered 2020-05-27 – 2020-05-29 (×3): 17 g via ORAL
  Filled 2020-05-27 (×3): qty 1

## 2020-05-27 MED ORDER — SENNOSIDES-DOCUSATE SODIUM 8.6-50 MG PO TABS
1.0000 | ORAL_TABLET | Freq: Two times a day (BID) | ORAL | Status: DC
  Administered 2020-05-27 – 2020-05-29 (×5): 1 via ORAL
  Filled 2020-05-27 (×5): qty 1

## 2020-05-27 NOTE — Care Management Important Message (Signed)
Important Message  Patient Details IM Letter given to Evette Cristal SW Case Manager to present to the Patient Name: [name] French MRN: [id number] Date of Birth: November 06, 1926   Medicare Important Message Given:  Yes     Kerin Salen 05/27/2020, 9:42 AM

## 2020-05-27 NOTE — Progress Notes (Signed)
[facility] Progress Note   Subjective/Interval events:   Moved to floor.  Had HR into 130s while brushing her teeth.  Serum sodium down to 127 this AM.  Unfortunately I/Os not documented since moving to floor, have requested.  She denies feeling very thirsty or dehydrated.  Denies nausea, vomiting, diarrhea, HA.   Objective Vitals:   05/26/20 2326 05/27/20 0139 05/27/20 0326 05/27/20 0804  BP: (!) 85/55 (!) 90/50 (!) 88/54 129/71  Pulse: (!) 106 (!) 102 (!) 111 (!) 125  Resp: 16 18 18 19   Temp: 98.1 F (36.7 C) 98.9 F (37.2 C) 98.4 F (36.9 C) 98.2 F (36.8 C)  TempSrc: Oral Oral Oral Oral  SpO2: 92% 97% 97% 98%  Weight:      Height:       Physical Exam General: well appearing in side chair  Heart: tachycardic, seems regular Lungs: dec L base, normal WOB, on RA now.  I don't hear rales Abdomen: soft, nontender Extremities: no edema Neuro: oriented and conversant  Additional Objective Labs: Basic Metabolic Panel: Recent Labs  Lab 05/25/20 0646 05/25/20 1102 05/25/20 2231 05/26/20 0228 05/27/20 0103  NA 136   < > 135 130*  132* 127*  K 4.1  --   --  3.8 3.8  CL 94*  --   --  92* 90*  CO2 34*  --   --  28 29  GLUCOSE 109*  --   --  94 132*  BUN 9  --   --  14 15  CREATININE 0.66  --   --  0.50 0.59  CALCIUM 9.2  --   --  8.6* 8.6*   < > = values in this interval not displayed.   Liver Function Tests: Recent Labs  Lab 05/23/20 0500 05/23/20 0911  AST 33 37  ALT 41 46*  ALKPHOS 73 79  BILITOT 0.8 0.9  PROT 6.0* 6.6  ALBUMIN 3.4* 3.8   No results for input(s): LIPASE, AMYLASE in the last 168 hours. CBC: Recent Labs  Lab 05/22/20 1706 05/23/20 0500  WBC 6.8 5.2  NEUTROABS 5.8  --   HGB 12.6 11.1*  HCT 37.8 33.2*  MCV 84.2 85.6  PLT 177 153   Blood Culture No results found for: SDES, SPECREQUEST, CULT, REPTSTATUS  Cardiac Enzymes: No results for input(s): CKTOTAL, CKMB, CKMBINDEX, TROPONINI in the last 168 hours. CBG: No  results for input(s): GLUCAP in the last 168 hours. Iron Studies: No results for input(s): IRON, TIBC, TRANSFERRIN, FERRITIN in the last 72 hours. @lablastinr3 @ Studies/Results: CT ANGIO CHEST PE W OR WO CONTRAST  Result Date: 05/26/2020 CLINICAL DATA:  86 year old with respiratory failure. Lung cancer status post radiation. New oxygen requirement. Shortness of breath. Pleural effusion versus pulmonary embolus. EXAM: CT ANGIOGRAPHY CHEST WITH CONTRAST TECHNIQUE: Multidetector CT imaging of the chest was performed using the standard protocol during bolus administration of intravenous contrast. Multiplanar CT image reconstructions and MIPs were obtained to evaluate the vascular anatomy. CONTRAST:  141mL OMNIPAQUE IOHEXOL 350 MG/ML SOLN COMPARISON:  Chest radiograph 05/24/2020. Most recent chest CT 04/20/2020 FINDINGS: Cardiovascular: No discrete filling defects in the pulmonary arteries to suggest pulmonary embolus. The left lower lobe pulmonary arteries are atretic but no filling defects are seen. Aortic atherosclerosis without aortic aneurysm. No aortic dissection. Upper normal heart size. Small pericardial effusion is unchanged. Mediastinum/Nodes: AP window lymph node measures 8 mm, series 4, image 41, previously 6 mm. Small lower paratracheal nodes, largest measuring 7 mm. No evidence  of hilar adenopathy. No esophageal wall thickening. No thyroid nodule. Lungs/Pleura: Progressive narrowing with filling of the left lower lobe bronchus, increased bronchial filling from prior. Complete collapse of the left lower lobe again seen. Increased narrowing and short segment occlusion of the lingular bronchus with associated soft tissue density. Left upper lobe bronchus is patent. 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 Right pleural effusion, moderate in size, new from prior exam. No definite pleural thickening or enhancement. Adjacent compressive atelectasis. Chronic volume  loss and nodular opacities abutting the right minor fissure, series 10 image 67, unchanged. Stable 5 mm pleural-based nodule in the right middle lobe, series 10, image 84. Mild mosaic pulmonary parenchymal attenuation is again seen. Upper Abdomen: No acute findings. Musculoskeletal: No acute osseous abnormality. No evidence of focal bone lesion. Review of the MIP images confirms the above findings. IMPRESSION: 1. No pulmonary embolus. 2. Right pleural effusion, moderate in size, new from prior exam. No definite pleural thickening or enhancement. 3. Progressive left lower lobe bronchial narrowing, with progressive bronchial filling. Increased narrowing of the lingular bronchus with short segment occlusion. 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 Complete left lower lobe collapse/consolidation, unchanged. 4. Small mediastinal lymph nodes remain subcentimeter but slightly increased from prior exam, potentially reactive. Aortic Atherosclerosis (ICD10-I70.0). Electronically Signed   By: Keith Rake M.D.   On: 05/26/2020 18:23   Medications:  . acidophilus  1 capsule Oral Daily  . anastrozole  0.5 mg Oral Daily  . aspirin EC  81 mg Oral Daily  . azithromycin  500 mg Oral QHS  . B-complex with vitamin C  1 tablet Oral Daily  . benzonatate  100 mg Oral TID  . cefdinir  300 mg Oral BID  . Chlorhexidine Gluconate Cloth  6 each Topical Daily  . cholecalciferol  1,000 Units Oral Daily  . doxylamine (Sleep)  25 mg Oral QHS  . guaiFENesin  1,200 mg Oral BID  . ipratropium-albuterol  3 mL Nebulization BID  . loratadine  10 mg Oral Daily  . metoprolol tartrate  12.5 mg Oral BID  . polyethylene glycol  17 g Oral Daily  . pravastatin  40 mg Oral Daily  . senna-docusate  1 tablet Oral BID  . traZODone  50 mg Oral QHS  . zolpidem  2.5 mg Oral QHS     Assessment/Plan: 1. Hyponatremia -  Serum sodium 122 on admission with Una 29, U osm 508, serum osm 262.   Initially she was felt to appear dry and was given a volume challenge without improvement in serum sodium which in fact went down to 121.  At that point she appeared volume overloaded and was treated with loop diuretic  + vaptan.   Had brisk diuresis + aquaresis with correction of serum sodium (actually too quick and required free water/DDAVP to lower).  Now she has serum sodium back to 127 without intervention.  I strongly suspect this is SIADH (has multiple risk factors particularly acute pulmonary issue).  Have requested repeat urine and serum osm today;  Strict I/Os, daily weights.  TSH and cortisol normal.   I think a 1561mL fluid restriction is appropriate at this time and will follow.  2. Hypoxic resp failure -  Multifactorial = had some volume excess and was diuresed Sun/Mon, now with persistent L pleural effusion with plans for thoracentesis today.  3. H/o breast Ca 2014, recur 2019- not active  4. H/o non-small cell lung cancer - sp  XRT completed Jan 2019, f/b Dr Lindi Adie 5. HFpEF: DD noted on 02/2020 TTE, diuresed sun/mon quite vigorously after IVF initially left her volume overloaded.     Jannifer Hick MD 05/27/2020, 1:03 PM  [facility] Pager: [phone]

## 2020-05-27 NOTE — Plan of Care (Signed)

## 2020-05-27 NOTE — Progress Notes (Signed)
This RN received call for [employer] regarding HR of 131 sustaining. Upon assessment, patient was found in bathroom with NT brushing her teeth. Assisted patient back to bed and obtained vitals. HR noted to be 131, patient with no complaints. 02 86% on RA, up to 57% with application of 2L [place].  Tawanna Solo, MD notified via [place]. Awaiting orders.

## 2020-05-27 NOTE — Progress Notes (Signed)
PT Cancellation Note  Patient Details Name: [name] French MRN: [id number] DOB: 1926-06-16   Cancelled Treatment:    Reason Eval/Treat Not Completed: Patient at procedure or test/unavailable(RN stated pt is about to leave floor for a thoracentesis. Will follow.)   Philomena Doheny PT 05/27/2020  [facility] Pager [phone] Office [phone]

## 2020-05-27 NOTE — Procedures (Addendum)
PROCEDURE SUMMARY:  Successful US guided right thoracentesis. Yielded 300 mL of orange fluid. Patient tolerated procedure well. No immediate complications. EBL = trace  Specimen was sent for labs.  Post procedure chest X-ray pending.  Patient would benefit from left thoracentesis tomorrow if clinically indicated.  Aryahi Denzler S Charese Abundis PA-C 05/27/2020 2:32 PM

## 2020-05-27 NOTE — Progress Notes (Addendum)
PROGRESS NOTE    [name] French  YIF:[id number] DOB: June 01, 1926 DOA: 05/22/2020 PCP: Haywood Pao, MD   Brief Narrative: 86 year old female with history of lung cancer, breast cancer, currently on chemotherapy, anemia of chronic disease, diverticulosis, history of meningioma, superficial presented with upper respiratory symptoms for 3 to 4 days.  Patient was seen by her PCP and was given antibiotics.  She was also having some cough with sore throat.  Chest x-ray did not show any acute findings.  Her sodium was noted to be very low and was sent to the emergency department by her PCP.  On presentation she was hemodynamically stable.  She was hyponatremic with sodium of 120.  Chest x-ray showed opacification of the left lung base 16 possible atelectasis versus infiltrate, moderate left-sided pleural effusion.  Hospital course complicated by rapid correction of sodium, new oxygen requirement.  CT confirmed new right pleural effusion.  Plan for thoracentesis today.  Requiring 2 L of oxygen per minute.  Assessment & Plan:   Principal Problem:   Hyponatremia Active Problems:   Non-small cell cancer of right lung (HCC)   Pleural effusion on left   Acute hyponatremia: Presented with sodium of 120.  Last blood work on 5/3 showed sodium of 137.  Could be assured with poor oral intake, underlying ascites due to history of lung cancer.  This is normal, normal cortisol level..  Started on fluid restriction.  Seen by nephrology who started on IV Lasix and tolvaptan.  Patient had rapid correction of sodium level overnight with sodium jumping from 1 22-1 38.  She was started on hypotonic fluids to reduce the levels and ensuring slow correction.  Sodium of 127 today.  Nephrology following.  Acute hypoxic respiratory failure/bilateral pleural effusion: Patient was found to have possible left-sided pneumonia on presentation.  Started on azithromycin and Rocephin.  Currently requiring 2 L of oxygen per minute.   She is not on home oxygen.  Continue to taper the oxygen. She has history of chronic left pleural effusion due to her breast surgery.  Chest CT showed right pleural effusion, moderate in size which is new finding.  Continue current antibiotics.  Plan for thoracentesis today.  Will check pleural fluid analysis.  History of non-small cell lung cancer:According to previous oncology notes patient diagnosed with left lower lobe stage IIb and right middle lobe T2N0 lung CA for which she received radiation therapy until January 2019.  CT chest obtained here revealed chronic left-sided fibrothorax/collapse/pleural effusion/pleural thickening.  Not sure if CT findings of bronchial filling concerning for malignancy versus infection.  No obvious pulmonary malignancy reported.  Right breast cancer: ER/PR positive, HER-2/neu negative. S/Pright lumpectomy in 2019-Currently on chemo-Herceptin as well as adjuvant anastrozole.  Can follow-up with Dr. Lindi Adie.  Insomnia: On Ambien  Hypertension: Continue current medications.  Monitor blood pressure  Sinus tachycardia: On metoprolol  Debility/deconditioning: We will request a physical therapy assessment.             DVT prophylaxis: Lovenox Code Status: DNR Family Communication: None Status is: Inpatient  Remains inpatient appropriate because:Ongoing diagnostic testing needed not appropriate for outpatient work up   Dispo: The patient is from: Home              Anticipated d/c is to: Home              Anticipated d/c date is: 1 day              Patient currently is not  medically stable to d/c.     Consultants: Nephrology  Procedures: Thoracentesis  Antimicrobials:  Anti-infectives (From admission, onward)   Start     Dose/Rate Route Frequency Ordered Stop   05/25/20 2200  azithromycin (ZITHROMAX) tablet 500 mg     500 mg Oral Daily at bedtime 05/25/20 1522 05/27/20 2359   05/25/20 2200  cefdinir (OMNICEF) 125 MG/5ML suspension 300 mg      300 mg Oral 2 times daily 05/25/20 1545 05/28/20 2159   05/23/20 2200  azithromycin (ZITHROMAX) 500 mg in sodium chloride 0.9 % 250 mL IVPB  Status:  Discontinued     500 mg 250 mL/hr over 60 Minutes Intravenous Every 24 hours 05/23/20 0925 05/25/20 1522   05/23/20 2000  cefTRIAXone (ROCEPHIN) 1 g in sodium chloride 0.9 % 100 mL IVPB  Status:  Discontinued     1 g 200 mL/hr over 30 Minutes Intravenous Every 24 hours 05/23/20 0925 05/25/20 1545   05/23/20 0115  levofloxacin (LEVAQUIN) IVPB 750 mg  Status:  Discontinued     750 mg 100 mL/hr over 90 Minutes Intravenous Daily at bedtime 05/23/20 0056 05/23/20 0923      Subjective: Patient seen and examined at the bedside this afternoon.  Hemodynamically stable.  Sitting on the chair.  Not in any kind of respiratory distress, no complaint of chest pain or shortness of breath or cough.  Little anxious about thoracentesis procedure.  Objective: Vitals:   05/26/20 2326 05/27/20 0139 05/27/20 0326 05/27/20 0804  BP: (!) 85/55 (!) 90/50 (!) 88/54 129/71  Pulse: (!) 106 (!) 102 (!) 111 (!) 125  Resp: _0 Temp: 98.1 F (36.7 C) 98.9 F (37.2 C) 98.4 F (36.9 C) 98.2 F (36.8 C)  TempSrc: Oral Oral Oral Oral  SpO2: 92% 97% 97% 98%  Weight:      Height:        Intake/Output Summary (Last 24 hours) at 05/27/2020 1255 Last data filed at 05/27/2020 1000 Gross per 24 hour  Intake 240 ml  Output --  Net 240 ml   Filed Weights   05/22/20 1652 05/24/20 1822  Weight: 60.3 kg 65.2 kg    Examination:  General exam: Pleasant elderly female HEENT:PERRL,Oral mucosa moist, Ear/Nose normal on gross exam Respiratory system: Bilateral diminished air entry, no obvious crackles or wheezes  cardiovascular system: S1 & S2 heard, RRR. No JVD, murmurs, rubs, gallops or clicks. No pedal edema. Gastrointestinal system: Abdomen is nondistended, soft and nontender. No organomegaly or masses felt. Normal bowel sounds heard. Central nervous  system: Alert and oriented. No focal neurological deficits. Extremities: No edema, no clubbing ,no cyanosis Skin: No rashes, lesions or ulcers,no icterus ,no pallor   Data Reviewed: I have personally reviewed following labs and imaging studies  CBC: Recent Labs  Lab 05/22/20 1706 05/23/20 0500  WBC 6.8 5.2  NEUTROABS 5.8  --   HGB 12.6 11.1*  HCT 37.8 33.2*  MCV 84.2 85.6  PLT 177 850   Basic Metabolic Panel: Recent Labs  Lab 05/24/20 1132 05/24/20 1132 05/24/20 1518 05/24/20 2309 05/25/20 0646 05/25/20 1102 05/25/20 1439 05/25/20 1830 05/25/20 2231 05/26/20 0228 05/27/20 0103  NA 121*   < > 122*   < > 136   < > 132* 131* 135 130*  132* 127*  K 4.6  --  4.7  --  4.1  --   --   --   --  3.8 3.8  CL 91*  --  91*  --  94*  --   --   --   --  92* 90*  CO2 25  --  27  --  34*  --   --   --   --  28 29  GLUCOSE 129*  --  126*  --  109*  --   --   --   --  94 132*  BUN 9  --  9  --  9  --   --   --   --  14 15  CREATININE 0.59  --  0.51  --  0.66  --   --   --   --  0.50 0.59  CALCIUM 8.3*  --  8.4*  --  9.2  --   --   --   --  8.6* 8.6*   < > = values in this interval not displayed.   GFR: Estimated Creatinine Clearance: 41.2 mL/min (by C-G formula based on SCr of 0.59 mg/dL). Liver Function Tests: Recent Labs  Lab 05/23/20 0500 05/23/20 0911  AST 33 37  ALT 41 46*  ALKPHOS 73 79  BILITOT 0.8 0.9  PROT 6.0* 6.6  ALBUMIN 3.4* 3.8   No results for input(s): LIPASE, AMYLASE in the last 168 hours. No results for input(s): AMMONIA in the last 168 hours. Coagulation Profile: No results for input(s): INR, PROTIME in the last 168 hours. Cardiac Enzymes: No results for input(s): CKTOTAL, CKMB, CKMBINDEX, TROPONINI in the last 168 hours. BNP (last 3 results) No results for input(s): PROBNP in the last 8760 hours. HbA1C: No results for input(s): HGBA1C in the last 72 hours. CBG: No results for input(s): GLUCAP in the last 168 hours. Lipid Profile: No results for  input(s): CHOL, HDL, LDLCALC, TRIG, CHOLHDL, LDLDIRECT in the last 72 hours. Thyroid Function Tests: No results for input(s): TSH, T4TOTAL, FREET4, T3FREE, THYROIDAB in the last 72 hours. Anemia Panel: No results for input(s): VITAMINB12, FOLATE, FERRITIN, TIBC, IRON, RETICCTPCT in the last 72 hours. Sepsis Labs: Recent Labs  Lab 05/27/20 0103  LATICACIDVEN 1.1    Recent Results (from the past 240 hour(s))  SARS Coronavirus 2 by RT PCR (hospital order, performed in [facility] hospital lab) Nasopharyngeal Nasopharyngeal Swab     Status: None   Collection Time: 05/22/20  5:04 PM   Specimen: Nasopharyngeal Swab  Result Value Ref Range Status   SARS Coronavirus 2 NEGATIVE NEGATIVE Final    Comment: (NOTE) SARS-CoV-2 target nucleic acids are NOT DETECTED. The SARS-CoV-2 RNA is generally detectable in upper and lower respiratory specimens during the acute phase of infection. The lowest concentration of SARS-CoV-2 viral copies this assay can detect is 250 copies / mL. A negative result does not preclude SARS-CoV-2 infection and should not be used as the sole basis for treatment or other patient management decisions.  A negative result may occur with improper specimen collection / handling, submission of specimen other than nasopharyngeal swab, presence of viral mutation(s) within the areas targeted by this assay, and inadequate number of viral copies (<250 copies / mL). A negative result must be combined with clinical observations, patient history, and epidemiological information. Fact Sheet for Patients:   StrictlyIdeas.no Fact Sheet for Healthcare Providers: BankingDealers.co.za This test is not yet approved or cleared  by the [place] FDA and has been authorized for detection and/or diagnosis of SARS-CoV-2 by FDA under an Emergency Use Authorization (EUA).  This EUA will remain in effect (meaning this test can be used) for the  duration  of the COVID-19 declaration under Section 564(b)(1) of the Act, 21 U.S.C. section 360bbb-3(b)(1), unless the authorization is terminated or revoked sooner. Performed at [facility] [place] [address], [place], [place] [address]   MRSA PCR Screening     Status: None   Collection Time: 05/24/20  5:51 PM   Specimen: Nasal Mucosa; Nasopharyngeal  Result Value Ref Range Status   MRSA by PCR NEGATIVE NEGATIVE Final    Comment:        The GeneXpert MRSA Assay (FDA approved for NASAL specimens only), is one component of a comprehensive MRSA colonization surveillance program. It is not intended to diagnose MRSA infection nor to guide or monitor treatment for MRSA infections. Performed at [facility], [place] [address] [place], [place] [address]          Radiology Studies: CT ANGIO CHEST PE W OR WO CONTRAST  Result Date: 05/26/2020 CLINICAL DATA:  86 year old with respiratory failure. Lung cancer status post radiation. New oxygen requirement. Shortness of breath. Pleural effusion versus pulmonary embolus. EXAM: CT ANGIOGRAPHY CHEST WITH CONTRAST TECHNIQUE: Multidetector CT imaging of the chest was performed using the standard protocol during bolus administration of intravenous contrast. Multiplanar CT image reconstructions and MIPs were obtained to evaluate the vascular anatomy. CONTRAST:  124m OMNIPAQUE IOHEXOL 350 MG/ML SOLN COMPARISON:  Chest radiograph 05/24/2020. Most recent chest CT 04/20/2020 FINDINGS: Cardiovascular: No discrete filling defects in the pulmonary arteries to suggest pulmonary embolus. The left lower lobe pulmonary arteries are atretic but no filling defects are seen. Aortic atherosclerosis without aortic aneurysm. No aortic dissection. Upper normal heart size. Small pericardial effusion is unchanged. Mediastinum/Nodes: AP window lymph node measures 8 mm, series 4, image 41, previously 6 mm. Small lower paratracheal nodes,  largest measuring 7 mm. No evidence of hilar adenopathy. No esophageal wall thickening. No thyroid nodule. Lungs/Pleura: Progressive narrowing with filling of the left lower lobe bronchus, increased bronchial filling from prior. Complete collapse of the left lower lobe again seen. Increased narrowing and short segment occlusion of the lingular bronchus with associated soft tissue density. Left upper lobe bronchus is patent. 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 Right pleural effusion, moderate in size, new from prior exam. No definite pleural thickening or enhancement. Adjacent compressive atelectasis. Chronic volume loss and nodular opacities abutting the right minor fissure, series 10 image 67, unchanged. Stable 5 mm pleural-based nodule in the right middle lobe, series 10, image 84. Mild mosaic pulmonary parenchymal attenuation is again seen. Upper Abdomen: No acute findings. Musculoskeletal: No acute osseous abnormality. No evidence of focal bone lesion. Review of the MIP images confirms the above findings. IMPRESSION: 1. No pulmonary embolus. 2. Right pleural effusion, moderate in size, new from prior exam. No definite pleural thickening or enhancement. 3. Progressive left lower lobe bronchial narrowing, with progressive bronchial filling. Increased narrowing of the lingular bronchus with short segment occlusion. 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 Complete left lower lobe collapse/consolidation, unchanged. 4. Small mediastinal lymph nodes remain subcentimeter but slightly increased from prior exam, potentially reactive. Aortic Atherosclerosis (ICD10-I70.0). Electronically Signed   By: MKeith RakeM.D.   On: 05/26/2020 18:23        Scheduled Meds: . acidophilus  1 capsule Oral Daily  . anastrozole  0.5 mg Oral Daily  . aspirin EC  81 mg Oral Daily  . azithromycin  500 mg Oral QHS  . B-complex with  vitamin C  1 tablet Oral Daily  .  benzonatate  100 mg Oral TID  . cefdinir  300 mg Oral BID  . Chlorhexidine Gluconate Cloth  6 each Topical Daily  . cholecalciferol  1,000 Units Oral Daily  . doxylamine (Sleep)  25 mg Oral QHS  . guaiFENesin  1,200 mg Oral BID  . ipratropium-albuterol  3 mL Nebulization BID  . loratadine  10 mg Oral Daily  . metoprolol tartrate  12.5 mg Oral BID  . polyethylene glycol  17 g Oral Daily  . pravastatin  40 mg Oral Daily  . senna-docusate  1 tablet Oral BID  . traZODone  50 mg Oral QHS  . zolpidem  2.5 mg Oral QHS   Continuous Infusions:   LOS: 5 days    Time spent: 25 min .More than 50% of that time was spent in counseling and/or coordination of care.      Shelly Coss, MD [facility] P6/08/2020, 12:55 PM

## 2020-05-28 ENCOUNTER — Ambulatory Visit: Payer: Medicare Other

## 2020-05-28 ENCOUNTER — Inpatient Hospital Stay (HOSPITAL_COMMUNITY): Payer: Medicare Other

## 2020-05-28 ENCOUNTER — Other Ambulatory Visit: Payer: Medicare Other

## 2020-05-28 ENCOUNTER — Other Ambulatory Visit (HOSPITAL_COMMUNITY): Payer: Medicare Other

## 2020-05-28 LAB — GRAM STAIN

## 2020-05-28 LAB — BASIC METABOLIC PANEL
Anion gap: 9 (ref 5–15)
BUN: 9 mg/dL (ref 8–23)
CO2: 30 mmol/L (ref 22–32)
Calcium: 9 mg/dL (ref 8.9–10.3)
Chloride: 93 mmol/L — ABNORMAL LOW (ref 98–111)
Creatinine, Ser: 0.55 mg/dL (ref 0.44–1.00)
GFR calc Af Amer: >60 mL/min (ref >60)
GFR calc non Af Amer: >60 mL/min (ref >60)
Glucose, Bld: 106 mg/dL — ABNORMAL HIGH (ref 70–99)
Potassium: 4.7 mmol/L (ref 3.5–5.1)
Sodium: 132 mmol/L — ABNORMAL LOW (ref 135–145)

## 2020-05-28 LAB — TSH: TSH: 1.89 u[IU]/mL (ref 0.350–4.500)

## 2020-05-28 LAB — PH, BODY FLUID: pH, Body Fluid: 7.4

## 2020-05-28 LAB — CYTOLOGY - NON PAP

## 2020-05-28 MED ORDER — FUROSEMIDE 20 MG PO TABS
20.0000 mg | ORAL_TABLET | Freq: Every day | ORAL | Status: DC
  Filled 2020-05-28: qty 1

## 2020-05-28 MED ORDER — LIDOCAINE HCL 1 % IJ SOLN
INTRAMUSCULAR | Status: AC
  Filled 2020-05-28: qty 20

## 2020-05-28 NOTE — Progress Notes (Signed)
PROGRESS NOTE    [name] French  UXN:[id number] DOB: 1926-01-13 DOA: 05/22/2020 PCP: Haywood Pao, MD   Brief Narrative: 86 year old female with history of lung cancer, breast cancer, currently on chemotherapy, anemia of chronic disease, diverticulosis, history of meningioma, superficial presented with upper respiratory symptoms for 3 to 4 days.  Patient was seen by her PCP and was given antibiotics.  She was also having some cough with sore throat.  Chest x-ray did not show any acute findings.  Her sodium was noted to be very low and was sent to the emergency department by her PCP.  On presentation she was hemodynamically stable.  She was hyponatremic with sodium of 120.  Chest x-ray showed opacification of the left lung base 16 possible atelectasis versus infiltrate, moderate left-sided pleural effusion.  Hospital course complicated by rapid correction of sodium, new oxygen requirement.  CT confirmed new right pleural effusion and moderate persistent left pleural effusion.  Underwent bilateral thoracentesis.  Hospital course remarkable for persistent sinus tachycardia, soft blood pressure.  Waiting for PT evaluation.  Assessment & Plan:   Principal Problem:   Hyponatremia Active Problems:   Non-small cell cancer of right lung (HCC)   Pleural effusion on left   Acute hyponatremia: Presented with sodium of 120.  Last blood work on 5/3 showed sodium of 137.  Could be assured with poor oral intake, underlying ascites due to history of lung cancer.  This is normal, normal cortisol level..  Started on fluid restriction.  Seen by nephrology who started on IV Lasix and tolvaptan.  Patient had rapid correction of sodium level overnight with sodium jumping from 122-138.  She was started on hypotonic fluids to reduce the levels and ensuring slow correction.  Sodium of 132  today.  Nephrology following.  Acute hypoxic respiratory failure/bilateral pleural effusion: Patient was found to have possible  left-sided pneumonia on presentation.  Started on azithromycin and Rocephin.  She required oxygen  supplementation.  She is not on home oxygen.  This morning she was on room air. She has history of chronic left pleural effusion due to her breast surgery.  Chest CT showed right pleural effusion, moderate in size which is new finding.She underwent bilateral thoracentesis during this hospitalization .Continue current antibiotics.  Pleural fluid analysis showed transudative nature.  Persistent sinus tachycardia: Blood pressure soft, she is on metoprolol.  Heart rate in the range of 120.  Will check echocardiogram.TSH is normal  History of non-small cell lung cancer:According to previous oncology notes patient diagnosed with left lower lobe stage IIb and right middle lobe T2N0 lung CA for which she received radiation therapy until January 2019.  CT chest obtained here revealed chronic left-sided fibrothorax/collapse/pleural effusion/pleural thickening.  Not sure if CT findings of bronchial filling concerning for malignancy versus infection.  No obvious pulmonary malignancy reported.  Right breast cancer: ER/PR positive, HER-2/neu negative. S/Pright lumpectomy in 2019-Currently on chemo-Herceptin as well as adjuvant anastrozole.  Can follow-up with Dr. Lindi Adie.  Insomnia: On Ambien  Hypertension: Continue current medications.  Monitor blood pressure  Sinus tachycardia: On metoprolol  Debility/deconditioning: We have requested a physical therapy assessment.             DVT prophylaxis: Lovenox Code Status: DNR Family Communication: daughter on phone on 05/28/20 Status is: Inpatient  Remains inpatient appropriate because:Ongoing diagnostic testing needed not appropriate for outpatient work up   Dispo: The patient is from: Home  Anticipated d/c is to: Home              Anticipated d/c date is: 1 day              Patient currently is not medically stable to d/c. Likely  tomorrow    Consultants: Nephrology  Procedures: Thoracentesis  Antimicrobials:  Anti-infectives (From admission, onward)   Start     Dose/Rate Route Frequency Ordered Stop   05/25/20 2200  azithromycin (ZITHROMAX) tablet 500 mg        500 mg Oral Daily at bedtime 05/25/20 1522 05/27/20 2242   05/25/20 2200  cefdinir (OMNICEF) 125 MG/5ML suspension 300 mg        300 mg Oral 2 times daily 05/25/20 1545 05/28/20 0926   05/23/20 2200  azithromycin (ZITHROMAX) 500 mg in sodium chloride 0.9 % 250 mL IVPB  Status:  Discontinued        500 mg 250 mL/hr over 60 Minutes Intravenous Every 24 hours 05/23/20 0925 05/25/20 1522   05/23/20 2000  cefTRIAXone (ROCEPHIN) 1 g in sodium chloride 0.9 % 100 mL IVPB  Status:  Discontinued        1 g 200 mL/hr over 30 Minutes Intravenous Every 24 hours 05/23/20 0925 05/25/20 1545   05/23/20 0115  levofloxacin (LEVAQUIN) IVPB 750 mg  Status:  Discontinued        750 mg 100 mL/hr over 90 Minutes Intravenous Daily at bedtime 05/23/20 0056 05/23/20 0923      Subjective: Patient seen and examined at the bedside this morning.  She looks comfortable today.  She was on room air during my evaluation.  Did not complain of any shortness of breath or chest pain.  Her blood pressure is soft and she is in persistent sinus tachycardia.  She underwent left-sided thoracentesis today.  She is waiting for physical therapy assessment.  Objective: Vitals:   05/27/20 2134 05/28/20 0601 05/28/20 0746 05/28/20 1229  BP: 113/61 103/78  (!) 91/57  Pulse: (!) 122 (!) 110  (!) 110  Resp: _0 Temp: 98.5 F (36.9 C) 98.5 F (36.9 C)  98.3 F (36.8 C)  TempSrc: Oral Oral  Oral  SpO2: (!) 86% 93% 93% 100%  Weight:  60.4 kg    Height:        Intake/Output Summary (Last 24 hours) at 05/28/2020 1352 Last data filed at 05/28/2020 7654 Gross per 24 hour  Intake 240 ml  Output 1450 ml  Net -1210 ml   Filed Weights   05/22/20 1652 05/24/20 1822 05/28/20 0601  Weight:  60.3 kg 65.2 kg 60.4 kg    Examination:  General exam: Pleasant elderly female, deconditioned, debilitated HEENT:PERRL,Oral mucosa moist, Ear/Nose normal on gross exam Respiratory system: Bilateral diminished air entry, no obvious crackles or wheezes  cardiovascular system: S1 & S2 heard, RRR. No JVD, murmurs, rubs, gallops or clicks. No pedal edema. Gastrointestinal system: Abdomen is nondistended, soft and nontender. No organomegaly or masses felt. Normal bowel sounds heard. Central nervous system: Alert and oriented. No focal neurological deficits. Extremities: No edema, no clubbing ,no cyanosis Skin: No rashes, lesions or ulcers,no icterus ,no pallor   Data Reviewed: I have personally reviewed following labs and imaging studies  CBC: Recent Labs  Lab 05/22/20 1706 05/23/20 0500  WBC 6.8 5.2  NEUTROABS 5.8  --   HGB 12.6 11.1*  HCT 37.8 33.2*  MCV 84.2 85.6  PLT 177 650   Basic Metabolic Panel: Recent Labs  Lab 05/24/20 1518 05/24/20 2309 05/25/20 0646 05/25/20 1102 05/25/20 1830 05/25/20 2231 05/26/20 0228 05/27/20 0103 05/28/20 0541  NA 122*   < > 136   < > 131* 135 130*  132* 127* 132*  K 4.7  --  4.1  --   --   --  3.8 3.8 4.7  CL 91*  --  94*  --   --   --  92* 90* 93*  CO2 27  --  34*  --   --   --  _0 GLUCOSE 126*  --  109*  --   --   --  94 132* 106*  BUN 9  --  9  --   --   --  _1 CREATININE 0.51  --  0.66  --   --   --  0.50 0.59 0.55  CALCIUM 8.4*  --  9.2  --   --   --  8.6* 8.6* 9.0   < > = values in this interval not displayed.   GFR: Estimated Creatinine Clearance: 41.2 mL/min (by C-G formula based on SCr of 0.55 mg/dL). Liver Function Tests: Recent Labs  Lab 05/23/20 0500 05/23/20 0911  AST 33 37  ALT 41 46*  ALKPHOS 73 79  BILITOT 0.8 0.9  PROT 6.0* 6.6  ALBUMIN 3.4* 3.8   No results for input(s): LIPASE, AMYLASE in the last 168 hours. No results for input(s): AMMONIA in the last 168 hours. Coagulation Profile: No  results for input(s): INR, PROTIME in the last 168 hours. Cardiac Enzymes: No results for input(s): CKTOTAL, CKMB, CKMBINDEX, TROPONINI in the last 168 hours. BNP (last 3 results) No results for input(s): PROBNP in the last 8760 hours. HbA1C: No results for input(s): HGBA1C in the last 72 hours. CBG: No results for input(s): GLUCAP in the last 168 hours. Lipid Profile: No results for input(s): CHOL, HDL, LDLCALC, TRIG, CHOLHDL, LDLDIRECT in the last 72 hours. Thyroid Function Tests: Recent Labs    05/28/20 1035  TSH 1.890   Anemia Panel: No results for input(s): VITAMINB12, FOLATE, FERRITIN, TIBC, IRON, RETICCTPCT in the last 72 hours. Sepsis Labs: Recent Labs  Lab 05/27/20 0103  LATICACIDVEN 1.1    Recent Results (from the past 240 hour(s))  SARS Coronavirus 2 by RT PCR (hospital order, performed in [facility] hospital lab) Nasopharyngeal Nasopharyngeal Swab     Status: None   Collection Time: 05/22/20  5:04 PM   Specimen: Nasopharyngeal Swab  Result Value Ref Range Status   SARS Coronavirus 2 NEGATIVE NEGATIVE Final    Comment: (NOTE) SARS-CoV-2 target nucleic acids are NOT DETECTED. The SARS-CoV-2 RNA is generally detectable in upper and lower respiratory specimens during the acute phase of infection. The lowest concentration of SARS-CoV-2 viral copies this assay can detect is 250 copies / mL. A negative result does not preclude SARS-CoV-2 infection and should not be used as the sole basis for treatment or other patient management decisions.  A negative result may occur with improper specimen collection / handling, submission of specimen other than nasopharyngeal swab, presence of viral mutation(s) within the areas targeted by this assay, and inadequate number of viral copies (<250 copies / mL). A negative result must be combined with clinical observations, patient history, and epidemiological information. Fact Sheet for Patients:     StrictlyIdeas.no Fact Sheet for Healthcare Providers: BankingDealers.co.za This test is not yet approved or cleared  by the [place] FDA and has been authorized  for detection and/or diagnosis of SARS-CoV-2 by FDA under an Emergency Use Authorization (EUA).  This EUA will remain in effect (meaning this test can be used) for the duration of the COVID-19 declaration under Section 564(b)(1) of the Act, 21 U.S.C. section 360bbb-3(b)(1), unless the authorization is terminated or revoked sooner. Performed at [facility], [place] [address], [place], [place] [address]   MRSA PCR Screening     Status: None   Collection Time: 05/24/20  5:51 PM   Specimen: Nasal Mucosa; Nasopharyngeal  Result Value Ref Range Status   MRSA by PCR NEGATIVE NEGATIVE Final    Comment:        The GeneXpert MRSA Assay (FDA approved for NASAL specimens only), is one component of a comprehensive MRSA colonization surveillance program. It is not intended to diagnose MRSA infection nor to guide or monitor treatment for MRSA infections. Performed at [facility], [place] [address], [place], [place] [address]   Culture, body fluid-bottle     Status: None (Preliminary result)   Collection Time: 05/27/20  2:48 PM   Specimen: Fluid  Result Value Ref Range Status   Specimen Description FLUID PLEURAL  Final   Special Requests BOTTLES DRAWN AEROBIC AND ANAEROBIC  Final   Culture   Final    NO GROWTH < 12 HOURS Performed at [facility], [place] [address] [place], [place] [address]    Report Status PENDING  Incomplete  Gram stain     Status: None   Collection Time: 05/27/20  2:48 PM   Specimen: Fluid  Result Value Ref Range Status   Specimen Description FLUID PLEURAL  Final   Special Requests NONE  Final   Gram Stain   Final    ABUNDANT WBC PRESENT, PREDOMINANTLY PMN NO ORGANISMS SEEN Performed at [facility],  [place] [address], [place], [place] [address]    Report Status 05/28/2020 FINAL  Final         Radiology Studies: DG Chest 1 View  Result Date: 05/27/2020 CLINICAL DATA:  Post thoracentesis EXAM: CHEST  1 VIEW COMPARISON:  05/24/2020 FINDINGS: Upper normal heart size. Mediastinal contours and pulmonary vascularity normal. Bronchitic changes with subsegmental atelectasis at minor fissure. Moderate LEFT pleural effusion and LEFT basilar atelectasis unchanged. No new infiltrate or pneumothorax. Bones demineralized. IMPRESSION: Persistent moderate LEFT pleural effusion and basilar atelectasis. Bronchitic changes with subsegmental atelectasis at minor fissure. No pneumothorax following thoracentesis. Electronically Signed   By: Lavonia Dana M.D.   On: 05/27/2020 15:25   CT ANGIO CHEST PE W OR WO CONTRAST  Result Date: 05/26/2020 CLINICAL DATA:  86 year old with respiratory failure. Lung cancer status post radiation. New oxygen requirement. Shortness of breath. Pleural effusion versus pulmonary embolus. EXAM: CT ANGIOGRAPHY CHEST WITH CONTRAST TECHNIQUE: Multidetector CT imaging of the chest was performed using the standard protocol during bolus administration of intravenous contrast. Multiplanar CT image reconstructions and MIPs were obtained to evaluate the vascular anatomy. CONTRAST:  115m OMNIPAQUE IOHEXOL 350 MG/ML SOLN COMPARISON:  Chest radiograph 05/24/2020. Most recent chest CT 04/20/2020 FINDINGS: Cardiovascular: No discrete filling defects in the pulmonary arteries to suggest pulmonary embolus. The left lower lobe pulmonary arteries are atretic but no filling defects are seen. Aortic atherosclerosis without aortic aneurysm. No aortic dissection. Upper normal heart size. Small pericardial effusion is unchanged. Mediastinum/Nodes: AP window lymph node measures 8 mm, series 4, image 41, previously 6 mm. Small lower paratracheal nodes, largest measuring 7 mm. No evidence of hilar adenopathy.  No esophageal  wall thickening. No thyroid nodule. Lungs/Pleura: Progressive narrowing with filling of the left lower lobe bronchus, increased bronchial filling from prior. Complete collapse of the left lower lobe again seen. Increased narrowing and short segment occlusion of the lingular bronchus with associated soft tissue density. Left upper lobe bronchus is patent. 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 Right pleural effusion, moderate in size, new from prior exam. No definite pleural thickening or enhancement. Adjacent compressive atelectasis. Chronic volume loss and nodular opacities abutting the right minor fissure, series 10 image 67, unchanged. Stable 5 mm pleural-based nodule in the right middle lobe, series 10, image 84. Mild mosaic pulmonary parenchymal attenuation is again seen. Upper Abdomen: No acute findings. Musculoskeletal: No acute osseous abnormality. No evidence of focal bone lesion. Review of the MIP images confirms the above findings. IMPRESSION: 1. No pulmonary embolus. 2. Right pleural effusion, moderate in size, new from prior exam. No definite pleural thickening or enhancement. 3. Progressive left lower lobe bronchial narrowing, with progressive bronchial filling. Increased narrowing of the lingular bronchus with short segment occlusion. 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 Complete left lower lobe collapse/consolidation, unchanged. 4. Small mediastinal lymph nodes remain subcentimeter but slightly increased from prior exam, potentially reactive. Aortic Atherosclerosis (ICD10-I70.0). Electronically Signed   By: Keith Rake M.D.   On: 05/26/2020 18:23   DG Chest Port 1 View  Result Date: 05/28/2020 CLINICAL DATA:  Status post left thoracentesis EXAM: PORTABLE CHEST 1 VIEW COMPARISON:  05/27/2020 FINDINGS: Cardiac shadow is stable. Mild volume loss on the left is noted. Interval thoracentesis has been  performed on the left. Decrease in left pleural effusion is noted. No pneumothorax is seen. Right lung remains clear. IMPRESSION: Decrease in left pleural effusion without evidence of pneumothorax following thoracentesis. Electronically Signed   By: Inez Catalina M.D.   On: 05/28/2020 12:14   US THORACENTESIS ASP PLEURAL SPACE W/IMG GUIDE  Result Date: 05/28/2020 INDICATION: Patient with history of lung cancer s/p radiation with dyspnea and bilateral pleural effusions s/p right thoracentesis in IR 05/27/2020 yielding 300 mL. Request is made for therapeutic left thoracentesis. EXAM: ULTRASOUND GUIDED THERAPEUTIC LEFT THORACENTESIS MEDICATIONS: 10 mL 1% lidocaine COMPLICATIONS: None immediate. PROCEDURE: An ultrasound guided thoracentesis was thoroughly discussed with the patient and questions answered. The benefits, risks, alternatives and complications were also discussed. The patient understands and wishes to proceed with the procedure. Written consent was obtained. Ultrasound was performed to localize and mark an adequate pocket of fluid in the left chest. The area was then prepped and draped in the normal sterile fashion. 1% Lidocaine was used for local anesthesia. Under ultrasound guidance a 6 Fr Safe-T-Centesis catheter was introduced. Thoracentesis was performed. The catheter was removed and a dressing applied. FINDINGS: A total of approximately 300 mL of hazy amber fluid was removed. Procedure was stopped after 300 mL due to patient symptoms (development of chest pain). IMPRESSION: Successful ultrasound guided left thoracentesis yielding 300 mL of pleural fluid. Read by: Earley Abide, PA-C Electronically Signed   By: Aletta Edouard M.D.   On: 05/28/2020 12:21   US THORACENTESIS ASP PLEURAL SPACE W/IMG GUIDE  Result Date: 05/27/2020 INDICATION: Patient with history of lung cancer status post radiation. New symptoms of shortness of breath. Bilateral pleural effusions. Request for therapeutic and  diagnostic right thoracentesis. EXAM: ULTRASOUND GUIDED RIGHT THORACENTESIS MEDICATIONS: 1% lidocaine 10 mL COMPLICATIONS: None immediate. PROCEDURE: An ultrasound guided thoracentesis was thoroughly discussed with the patient and  questions answered. The benefits, risks, alternatives and complications were also discussed. The patient understands and wishes to proceed with the procedure. Written consent was obtained. Ultrasound was performed to localize and mark an adequate pocket of fluid in the right chest. The area was then prepped and draped in the normal sterile fashion. 1% Lidocaine was used for local anesthesia. Under ultrasound guidance a 6 Fr Safe-T-Centesis catheter was introduced. Thoracentesis was performed. The catheter was removed and a dressing applied. FINDINGS: A total of approximately 300 mL of orange fluid was removed. Samples were sent to the laboratory as requested by the clinical team. IMPRESSION: Successful ultrasound guided right thoracentesis yielding 300 mL of pleural fluid. No pneumothorax on post-procedure chest x-ray. Patient also has left pleural effusion and could possibly benefit from left thoracentesis if clinically indicated. Read by: Gareth Eagle, PA-C Electronically Signed   By: Jacqulynn Cadet M.D.   On: 05/27/2020 15:28        Scheduled Meds: . acidophilus  1 capsule Oral Daily  . anastrozole  0.5 mg Oral Daily  . aspirin EC  81 mg Oral Daily  . B-complex with vitamin C  1 tablet Oral Daily  . benzonatate  100 mg Oral TID  . cholecalciferol  1,000 Units Oral Daily  . doxylamine (Sleep)  25 mg Oral QHS  . guaiFENesin  1,200 mg Oral BID  . ipratropium-albuterol  3 mL Nebulization BID  . loratadine  10 mg Oral Daily  . metoprolol tartrate  12.5 mg Oral BID  . polyethylene glycol  17 g Oral Daily  . pravastatin  40 mg Oral Daily  . senna-docusate  1 tablet Oral BID  . traZODone  50 mg Oral QHS  . zolpidem  2.5 mg Oral QHS   Continuous Infusions:   LOS: 6  days    Time spent: 25 min .More than 50% of that time was spent in counseling and/or coordination of care.      Shelly Coss, MD [facility] P6/09/2020, 1:52 PM

## 2020-05-28 NOTE — Progress Notes (Signed)
SATURATION QUALIFICATIONS: (This note is used to comply with regulatory documentation for home oxygen)  Patient Saturations on Room Air at Rest = 93%  Patient Saturations on Room Air while Ambulating = 75%  Patient Saturations on 2 Liters of oxygen while Ambulating = 99%  Please briefly explain why patient needs home oxygen:  Patient hypoxic on room air during ambulation.

## 2020-05-28 NOTE — TOC Progression Note (Signed)
Transition of Care [facility]) - Progression Note    Patient Details  Name: [name] French MRN: [id number] Date of Birth: 1926-01-10  [facility]) CM/SW Contact  Ross Ludwig, [place] Phone Number: 05/28/2020, 5:21 PM  Clinical Narrative:     Patient is from [place], Independent living apartments.  CSW talked to patient to discuss if she is interested in going to health care for a few days to gain her strength back, however she stated she does not want to go to [place] but would prefer to go to home health.  CSW discussed with her the advantages and disadvantages about going to SNF or home health.  Patient stated she understands, and still would rather go home with home health.  CSW asked her if she had a preference for an agency, she stated she did not.  CSW will look for home health agencies for patient.  Patient also qualified for oxygen, CSW contacted [employer] to order oxygen for patient once she is medically ready for discharge.  Patient states she has not had oxygen before.  Patient did not have any other concerns or issues at this time.  Patient is planning to go home with home health once she is medically ready for discharge back to her apartment.   Expected Discharge Plan: Home/Self Care Barriers to Discharge: Continued Medical Work up  Expected Discharge Plan and Services Expected Discharge Plan: Home/Self Care   Discharge Planning Services: CM Consult   Living arrangements for the past 2 months: Apartment                                       Social Determinants of Health (SDOH) Interventions    Readmission Risk Interventions No flowsheet data found.

## 2020-05-28 NOTE — Progress Notes (Signed)
   05/28/20 1229  Vitals  BP (!) 91/57  MAP (mmHg) 68  BP Location Left Arm  Patient Position (if appropriate) Sitting  Pulse Rate (!) 110   Notified Adhikari, MD of BP above. Holding PO lasix.

## 2020-05-28 NOTE — Procedures (Signed)
PROCEDURE SUMMARY:  Successful image-guided left thoracentesis. Yielded 300 milliliters of hazy amber fluid. Procedure was stopped after 300 mL secondary to patient symptoms (development of chest pain). Patient tolerated procedure well. No immediate complications. EBL = 0 mL.  Specimen was not sent for labs. CXR ordered.  Please see imaging section of Epic for full dictation.   Claris Pong Letroy Vazguez PA-C 05/28/2020 11:46 AM

## 2020-05-28 NOTE — Progress Notes (Signed)
[facility] Progress Note   Subjective/Interval events:   S/p 359mL thoracentesis this AM.  I/Os yesterday 480 / 1450.  Wt same as admit wt, down from peak 6/6 65.2 to 60.4kg.  She denies feeling very thirsty or dehydrated.  Denies nausea, vomiting, diarrhea, HA.   Objective Vitals:   05/27/20 2134 05/28/20 0601 05/28/20 0746 05/28/20 1229  BP: 113/61 103/78  (!) 91/57  Pulse: (!) 122 (!) 110  (!) 110  Resp: 20 20  20   Temp: 98.5 F (36.9 C) 98.5 F (36.9 C)  98.3 F (36.8 C)  TempSrc: Oral Oral  Oral  SpO2: (!) 86% 93% 93% 100%  Weight:  60.4 kg    Height:       Physical Exam General: well appearing in side chair  Heart: tachycardic, seems regular Lungs: improved air movement, normal WOB, on RA now.   Abdomen: soft, nontender Extremities: no edema Neuro: oriented and conversant  Additional Objective Labs: Basic Metabolic Panel: Recent Labs  Lab 05/26/20 0228 05/27/20 0103 05/28/20 0541  NA 130*  132* 127* 132*  K 3.8 3.8 4.7  CL 92* 90* 93*  CO2 28 29 30   GLUCOSE 94 132* 106*  BUN 14 15 9   CREATININE 0.50 0.59 0.55  CALCIUM 8.6* 8.6* 9.0   Liver Function Tests: Recent Labs  Lab 05/23/20 0500 05/23/20 0911  AST 33 37  ALT 41 46*  ALKPHOS 73 79  BILITOT 0.8 0.9  PROT 6.0* 6.6  ALBUMIN 3.4* 3.8   No results for input(s): LIPASE, AMYLASE in the last 168 hours. CBC: Recent Labs  Lab 05/22/20 1706 05/23/20 0500  WBC 6.8 5.2  NEUTROABS 5.8  --   HGB 12.6 11.1*  HCT 37.8 33.2*  MCV 84.2 85.6  PLT 177 153   Blood Culture    Component Value Date/Time   SDES FLUID PLEURAL 05/27/2020 1448   SDES FLUID PLEURAL 05/27/2020 1448   SPECREQUEST BOTTLES DRAWN AEROBIC AND ANAEROBIC 05/27/2020 1448   SPECREQUEST NONE 05/27/2020 1448   CULT  05/27/2020 1448    NO GROWTH < 12 HOURS Performed at [facility], [place] [address] [place], [place] [address]    REPTSTATUS PENDING 05/27/2020 1448   REPTSTATUS 05/28/2020 FINAL 05/27/2020 1448     Cardiac Enzymes: No results for input(s): CKTOTAL, CKMB, CKMBINDEX, TROPONINI in the last 168 hours. CBG: No results for input(s): GLUCAP in the last 168 hours. Iron Studies: No results for input(s): IRON, TIBC, TRANSFERRIN, FERRITIN in the last 72 hours. @lablastinr3 @ Studies/Results: DG Chest 1 View  Result Date: 05/27/2020 CLINICAL DATA:  Post thoracentesis EXAM: CHEST  1 VIEW COMPARISON:  05/24/2020 FINDINGS: Upper normal heart size. Mediastinal contours and pulmonary vascularity normal. Bronchitic changes with subsegmental atelectasis at minor fissure. Moderate LEFT pleural effusion and LEFT basilar atelectasis unchanged. No new infiltrate or pneumothorax. Bones demineralized. IMPRESSION: Persistent moderate LEFT pleural effusion and basilar atelectasis. Bronchitic changes with subsegmental atelectasis at minor fissure. No pneumothorax following thoracentesis. Electronically Signed   By: Lavonia Dana M.D.   On: 05/27/2020 15:25   CT ANGIO CHEST PE W OR WO CONTRAST  Result Date: 05/26/2020 CLINICAL DATA:  86 year old with respiratory failure. Lung cancer status post radiation. New oxygen requirement. Shortness of breath. Pleural effusion versus pulmonary embolus. EXAM: CT ANGIOGRAPHY CHEST WITH CONTRAST TECHNIQUE: Multidetector CT imaging of the chest was performed using the standard protocol during bolus administration of intravenous contrast. Multiplanar CT image reconstructions and MIPs were obtained to evaluate the vascular anatomy.  CONTRAST:  178mL OMNIPAQUE IOHEXOL 350 MG/ML SOLN COMPARISON:  Chest radiograph 05/24/2020. Most recent chest CT 04/20/2020 FINDINGS: Cardiovascular: No discrete filling defects in the pulmonary arteries to suggest pulmonary embolus. The left lower lobe pulmonary arteries are atretic but no filling defects are seen. Aortic atherosclerosis without aortic aneurysm. No aortic dissection. Upper normal heart size. Small pericardial effusion is unchanged.  Mediastinum/Nodes: AP window lymph node measures 8 mm, series 4, image 41, previously 6 mm. Small lower paratracheal nodes, largest measuring 7 mm. No evidence of hilar adenopathy. No esophageal wall thickening. No thyroid nodule. Lungs/Pleura: Progressive narrowing with filling of the left lower lobe bronchus, increased bronchial filling from prior. Complete collapse of the left lower lobe again seen. Increased narrowing and short segment occlusion of the lingular bronchus with associated soft tissue density. Left upper lobe bronchus is patent. 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 Right pleural effusion, moderate in size, new from prior exam. No definite pleural thickening or enhancement. Adjacent compressive atelectasis. Chronic volume loss and nodular opacities abutting the right minor fissure, series 10 image 67, unchanged. Stable 5 mm pleural-based nodule in the right middle lobe, series 10, image 84. Mild mosaic pulmonary parenchymal attenuation is again seen. Upper Abdomen: No acute findings. Musculoskeletal: No acute osseous abnormality. No evidence of focal bone lesion. Review of the MIP images confirms the above findings. IMPRESSION: 1. No pulmonary embolus. 2. Right pleural effusion, moderate in size, new from prior exam. No definite pleural thickening or enhancement. 3. Progressive left lower lobe bronchial narrowing, with progressive bronchial filling. Increased narrowing of the lingular bronchus with short segment occlusion. 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 Complete left lower lobe collapse/consolidation, unchanged. 4. Small mediastinal lymph nodes remain subcentimeter but slightly increased from prior exam, potentially reactive. Aortic Atherosclerosis (ICD10-I70.0). Electronically Signed   By: Keith Rake M.D.   On: 05/26/2020 18:23   DG Chest Port 1 View  Result Date: 05/28/2020 CLINICAL DATA:   Status post left thoracentesis EXAM: PORTABLE CHEST 1 VIEW COMPARISON:  05/27/2020 FINDINGS: Cardiac shadow is stable. Mild volume loss on the left is noted. Interval thoracentesis has been performed on the left. Decrease in left pleural effusion is noted. No pneumothorax is seen. Right lung remains clear. IMPRESSION: Decrease in left pleural effusion without evidence of pneumothorax following thoracentesis. Electronically Signed   By: Inez Catalina M.D.   On: 05/28/2020 12:14   US THORACENTESIS ASP PLEURAL SPACE W/IMG GUIDE  Result Date: 05/27/2020 INDICATION: Patient with history of lung cancer status post radiation. New symptoms of shortness of breath. Bilateral pleural effusions. Request for therapeutic and diagnostic right thoracentesis. EXAM: ULTRASOUND GUIDED RIGHT THORACENTESIS MEDICATIONS: 1% lidocaine 10 mL COMPLICATIONS: None immediate. PROCEDURE: An ultrasound guided thoracentesis was thoroughly discussed with the patient and questions answered. The benefits, risks, alternatives and complications were also discussed. The patient understands and wishes to proceed with the procedure. Written consent was obtained. Ultrasound was performed to localize and mark an adequate pocket of fluid in the right chest. The area was then prepped and draped in the normal sterile fashion. 1% Lidocaine was used for local anesthesia. Under ultrasound guidance a 6 Fr Safe-T-Centesis catheter was introduced. Thoracentesis was performed. The catheter was removed and a dressing applied. FINDINGS: A total of approximately 300 mL of orange fluid was removed. Samples were sent to the laboratory as requested by the clinical team. IMPRESSION: Successful ultrasound guided right thoracentesis yielding 300 mL of pleural fluid. No pneumothorax  on post-procedure chest x-ray. Patient also has left pleural effusion and could possibly benefit from left thoracentesis if clinically indicated. Read by: Gareth Eagle, PA-C Electronically Signed    By: Jacqulynn Cadet M.D.   On: 05/27/2020 15:28   Medications:    acidophilus  1 capsule Oral Daily   anastrozole  0.5 mg Oral Daily   aspirin EC  81 mg Oral Daily   B-complex with vitamin C  1 tablet Oral Daily   benzonatate  100 mg Oral TID   cholecalciferol  1,000 Units Oral Daily   doxylamine (Sleep)  25 mg Oral QHS   guaiFENesin  1,200 mg Oral BID   ipratropium-albuterol  3 mL Nebulization BID   loratadine  10 mg Oral Daily   metoprolol tartrate  12.5 mg Oral BID   polyethylene glycol  17 g Oral Daily   pravastatin  40 mg Oral Daily   senna-docusate  1 tablet Oral BID   traZODone  50 mg Oral QHS   zolpidem  2.5 mg Oral QHS     Assessment/Plan: Hyponatremia -  TSH and cortisol normal, urine indices c/w SIADH.  Her serum sodium worsened after receiving isotonic fluids and she was treated with loop + vaptan with improvement.  No interventions other than fluid restriction in past 48h and her sodium is 132 this AM.  I think a 154mL fluid restriction is appropriate at this time and would recommend to continue it.  I suspect her acute pulmonary process is the underlying cause of her SIADH.   Hypoxic resp failure -  Multifactorial = had some volume excess and was diuresed Sun/Mon, now with persistent L pleural effusion s/o thoracentesis today. H/o breast Ca 2014, recur 2019- not active  H/o non-small cell lung cancer - sp XRT completed Jan 2019, f/b Dr Lindi Adie HFpEF: DD noted on 02/2020 TTE, diuresed sun/mon quite vigorously after IVF initially left her volume overloaded.    Discussed with daughter who is bedside today.  She has my card.  Will sign off, please follow daily sodiums, continue fluid restriction.  Call if questions or concerns.  Her PCP can f/u her serum sodium and if it's normal relax her fluid restriction and monitor.  I'm happy to see her in clinic if issues persist.    Jannifer Hick MD 05/28/2020, 1:02 PM  [facility] Pager: [phone]

## 2020-05-28 NOTE — Evaluation (Signed)
Physical Therapy Evaluation Patient Details Name: [name] French MRN: [id number] DOB: 06-Jun-1926 Today's Date: 05/28/2020   History of Present Illness  86 year old female with history of lung cancer, breast cancer, currently on chemotherapy, anemia of chronic disease, diverticulosis, history of meningioma, superficial presented with upper respiratory symptoms for 3 to 4 days  Clinical Impression  Pt admitted with above diagnosis. Pt ambulated 120' with RW, no loss of balance. HR 128 max with ambulation. SaO2 89% ~1 minute after ambulation, monitor did not read during ambulation. SaO2 94% on room air ~3 minutes after ambulation. Will need to obtain walking SaO2 levels prior to DC, nursing aware. Issued incentive spirometer and instructed pt in pursed lip breathing.  Pt currently with functional limitations due to the deficits listed below (see PT Problem List). Pt will benefit from skilled PT to increase their independence and safety with mobility to allow discharge to the venue listed below.       Follow Up Recommendations Home health PT    Equipment Recommendations  Other (comment) (4 wheeled RW)    Recommendations for Other Services       Precautions / Restrictions Precautions Precautions: Other (comment) Precaution Comments: monitor HR/O2      Mobility  Bed Mobility               General bed mobility comments: up in recliner  Transfers Overall transfer level: Needs assistance Equipment used: None Transfers: Sit to/from Stand Sit to Stand: Supervision         General transfer comment: VCs hand placement  Ambulation/Gait Ambulation/Gait assistance: Min guard Gait Distance (Feet): 120 Feet Assistive device: Rolling walker (2 wheeled) Gait Pattern/deviations: Step-through pattern;Decreased stride length Gait velocity: WFL   General Gait Details: steady with RW, no loss of balance, HR 128 max walking, pulse oximeter did not give a reading during ambulation, ~1  minute after walking SaO2 was 89% on room air, then 94% on room air after ~3 minutes of seated rest. VCs for pursed lip breathing  Stairs            Wheelchair Mobility    Modified Rankin (Stroke Patients Only)       Balance Overall balance assessment: Modified Independent                                           Pertinent Vitals/Pain Pain Assessment: 0-10 Pain Score: 3  Pain Location: L lateral chest at thoracentesis site Pain Descriptors / Indicators: Sore Pain Intervention(s): Limited activity within patient's tolerance;Monitored during session    Home Living Family/patient expects to be discharged to:: Private residence Living Arrangements: Alone Available Help at Discharge: Family;Available PRN/intermittently Type of Home: Apartment Home Access: Level entry     Home Layout: One level   Additional Comments: pt is from ILF at [facility]    Prior Function Level of Independence: Independent               Hand Dominance        Extremity/Trunk Assessment   Upper Extremity Assessment Upper Extremity Assessment: Overall WFL for tasks assessed    Lower Extremity Assessment Lower Extremity Assessment: Overall WFL for tasks assessed    Cervical / Trunk Assessment Cervical / Trunk Assessment: Normal  Communication   Communication: HOH  Cognition Arousal/Alertness: Awake/alert Behavior During Therapy: WFL for tasks assessed/performed Overall Cognitive Status: Within Functional Limits for tasks assessed  General Comments      Exercises General Exercises - Lower Extremity Ankle Circles/Pumps: AROM;Both;10 reps;Seated Long Arc Quad: AROM;Both;10 reps;Seated Hip Flexion/Marching: AROM;Both;10 reps;Seated Shoulder Exercises Shoulder Flexion: AROM;Both;10 reps;Seated   Assessment/Plan    PT Assessment Patient needs continued PT services  PT Problem List Cardiopulmonary  status limiting activity;Decreased activity tolerance;Decreased mobility       PT Treatment Interventions DME instruction;Gait training;Therapeutic activities;Therapeutic exercise;Patient/family education    PT Goals (Current goals can be found in the Care Plan section)  Acute Rehab PT Goals Patient Stated Goal: return to water aerobics PT Goal Formulation: With patient/family Time For Goal Achievement: 06/11/20 Potential to Achieve Goals: Good    Frequency Min 3X/week   Barriers to discharge        Co-evaluation               AM-PAC PT "6 Clicks" Mobility  Outcome Measure Help needed turning from your back to your side while in a flat bed without using bedrails?: A Little Help needed moving from lying on your back to sitting on the side of a flat bed without using bedrails?: A Little Help needed moving to and from a bed to a chair (including a wheelchair)?: None Help needed standing up from a chair using your arms (e.g., wheelchair or bedside chair)?: None Help needed to walk in hospital room?: None Help needed climbing 3-5 steps with a railing? : A Little 6 Click Score: 21    End of Session Equipment Utilized During Treatment: Gait belt Activity Tolerance: Patient tolerated treatment well Patient left: in chair;with call bell/phone within reach;with family/visitor present Nurse Communication: Mobility status PT Visit Diagnosis: Difficulty in walking, not elsewhere classified (R26.2)    Time: 1340-1411 PT Time Calculation (min) (ACUTE ONLY): 31 min   Charges:   PT Evaluation $PT Eval Low Complexity: 1 Low PT Treatments $Gait Training: 8-22 mins        Blondell Reveal Kistler PT 05/28/2020  Acute Rehabilitation Services Pager [phone] Office [phone]

## 2020-05-29 ENCOUNTER — Inpatient Hospital Stay (HOSPITAL_COMMUNITY): Payer: Medicare Other

## 2020-05-29 DIAGNOSIS — I5031 Acute diastolic (congestive) heart failure: Secondary | ICD-10-CM

## 2020-05-29 LAB — ECHOCARDIOGRAM COMPLETE
Height: 65 in
Weight: 2099.2 oz

## 2020-05-29 LAB — BASIC METABOLIC PANEL
Anion gap: 9 (ref 5–15)
BUN: 16 mg/dL (ref 8–23)
CO2: 32 mmol/L (ref 22–32)
Calcium: 9.2 mg/dL (ref 8.9–10.3)
Chloride: 92 mmol/L — ABNORMAL LOW (ref 98–111)
Creatinine, Ser: 0.62 mg/dL (ref 0.44–1.00)
GFR calc Af Amer: >60 mL/min (ref >60)
GFR calc non Af Amer: >60 mL/min (ref >60)
Glucose, Bld: 108 mg/dL — ABNORMAL HIGH (ref 70–99)
Potassium: 4.6 mmol/L (ref 3.5–5.1)
Sodium: 133 mmol/L — ABNORMAL LOW (ref 135–145)

## 2020-05-29 MED ORDER — GUAIFENESIN-DM 100-10 MG/5ML PO SYRP: 5.0000 mL | ORAL_SOLUTION | ORAL | 1 refills | Status: DC | PRN

## 2020-05-29 NOTE — Progress Notes (Signed)
  Echocardiogram 2D Echocardiogram has been performed.  [name] French 05/29/2020, 10:04 AM

## 2020-05-29 NOTE — TOC Transition Note (Addendum)
Transition of Care [facility]) - CM/SW Discharge Note   Patient Details  Name: [name] French MRN: [id number] Date of Birth: 1926/04/03  Transition of Care [facility]) CM/SW Contact:  Ross Ludwig, LCSW Phone Number: 05/29/2020, 3:34 PM   Clinical Narrative:     PT recommended a rolling walker for patient, and she is also new oxygen.  CSW spoke to [employer] and they were able to provide rolling walker, and oxygen for patient.  Patient will be going home with home health through [place].  CSW signing off please reconsult with any other social work needs, home health agency has been notified of planned discharge.  Patient's daughter will be transporting back to [place].   Final next level of care: Other (comment) [facility]) Barriers to Discharge: Barriers Resolved   Patient Goals and CMS Choice Patient states their goals for this hospitalization and ongoing recovery are:: To return back home with home health services. CMS Medicare.gov Compare Post Acute Care list provided to:: Patient Choice offered to / list presented to : Patient  Discharge Placement  Patient discharging back to [place].                     Discharge Plan and Services   Discharge Planning Services: CM Consult            DME Arranged: Oxygen, Walker rolling DME Agency: [employer] Date DME Agency Contacted: 05/29/20 Time DME Agency Contacted: [id number] Representative spoke with at DME Agency: Thedore Mins HH Arranged: PT, OT [place] Agency: [place] Date [place]: 05/28/20 Time [place]: [id number] Representative spoke with at [place]: [place] ([place]) Interventions     Readmission Risk Interventions No flowsheet data found.

## 2020-05-29 NOTE — Discharge Summary (Signed)
Physician Discharge Summary  [name] French GGY:[id number] DOB: 1926-05-03 DOA: 05/22/2020  PCP: Haywood Pao, MD  Admit date: 05/22/2020 Discharge date: 05/29/2020  Admitted From: Home Disposition:  Home  Discharge Condition:Stable CODE STATUS:DNR Diet recommendation: Heart Healthy  Brief/Interim Summary:  86 year old female with history of lung cancer, breast cancer, currently on chemotherapy, anemia of chronic disease, diverticulosis, history of meningioma, superficial presented with upper respiratory symptoms for 3 to 4 days.  Patient was seen by her PCP and was given antibiotics.  She was also having some cough with sore throat.  Chest x-ray did not show any acute findings.  Her sodium was noted to be very low and was sent to the emergency department by her PCP.  On presentation she was hemodynamically stable.  She was hyponatremic with sodium of 120.  Chest x-ray showed opacification of the left lung base 16 possible atelectasis versus infiltrate, moderate left-sided pleural effusion.  Hospital course complicated by rapid correction of sodium, new oxygen requirement.  CT confirmed new right pleural effusion and moderate persistent left pleural effusion.  Underwent bilateral thoracentesis.  Hospital course remarkable for persistent sinus tachycardia, soft blood pressure.    She was seen by PT and recommended home health.  She is hemodynamically stable for discharge to home today.  Following problems were addressed during her hospitalization:  Acute hyponatremia: Presented with sodium of 120. 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 TSH is normal, normal cortisol level..  Started on fluid restriction.  Seen by nephrology who started on IV Lasix and tolvaptan.  Patient had rapid correction of sodium level overnight with sodium jumping from 122-138.  She was started on hypotonic fluids to reduce the levels and ensuring slow correction. Now  sodium level is stable.  Acute hypoxic respiratory failure/bilateral pleural effusion: Patient was found to have possible left-sided pneumonia on presentation.  Started on azithromycin and Rocephin.  She required oxygen  supplementation.  She is not on home oxygen.  She has history of chronic left pleural effusion due to her breast surgery.  Chest CT showed right pleural effusion, moderate in size which is new finding.She underwent bilateral thoracentesis during this hospitalization .She finished the course of  antibiotics.  Pleural fluid was transudative in nature.  She qualified for home oxygen.  Inappropriate sinus tachycardia:  She has history of inappropriate sinus tachycardia and was seen by cardiology before.  Blood pressure soft, she is on metoprolol.  Heart rate in the range of 120.  TSH is normal.  She is completely asymptomatic.  Echocardiogram showed normal left ventricular function, grade 2 diastolic dysfunction, moderate pulmonary artery hypertension.  She cannot tolerate any diuretics because of her chronic low blood pressure. She can follow-up with her cardiologist Dr. Peter Martinique as an outpatient  History of non-small cell lung cancer:According to previous oncology notes patient diagnosed with left lower lobe stage IIb and right middle lobe T2N0 lung CA for which she received radiation therapy until January 2019. CT chest obtained here revealed chronic left-sided fibrothorax/collapse/pleural effusion/pleural thickening. Follow-up with oncology as an outpatient.  Right breast cancer: ER/PR positive, HER-2/neu negative. S/Pright lumpectomyin 2019-Currently onchemo-Herceptinas well asadjuvant anastrozole.Canfollow-up with Dr. Lindi Adie.  Insomnia: On Ambien  Hypertension: BP soft, continue current medications  Debility/deconditioning: Physical therapy recommended home health on discharge.Marland Kitchen     Discharge Diagnoses:  Principal Problem:   Hyponatremia Active  Problems:   Non-small cell cancer of right lung (HCC)   Pleural effusion on  left    Discharge Instructions  Discharge Instructions    Diet - low sodium heart healthy   Complete by: As directed    Discharge instructions   Complete by: As directed    1)Continue your home medications. 2)Follow up with your PCP in a week 3)Follow up with cardiologist in 2 to 3 weeks.   Increase activity slowly   Complete by: As directed      Allergies as of 05/29/2020      Reactions   Other Anaphylaxis   Lobster- "years ago"   Codeine Itching   Insomnia    Hydrocodone Itching, Other (See Comments)   Insomnia   Neosporin [neomycin-bacitracin Zn-polymyx] Rash      Medication List    TAKE these medications   acetaminophen 500 MG tablet Commonly known as: TYLENOL Take 500 mg by mouth every 6 (six) hours as needed for moderate pain or headache.   Align 4 MG Caps Take 4 mg daily by mouth.   anastrozole 1 MG tablet Commonly known as: ARIMIDEX Take 0.5 tablets (0.5 mg total) by mouth daily.   aspirin 81 MG tablet Take 81 mg by mouth daily.   BIOFREEZE EX Apply 1 application topically daily as needed (leg pain).   BION TEARS OP Place 1-2 drops into both eyes as needed (dry eyes).   cetirizine 10 MG tablet Commonly known as: ZYRTEC Take 10 mg daily by mouth.   Glucosamine-Chondroit-MSM-C-Mn Caps Take 1 capsule by mouth daily.   guaiFENesin-dextromethorphan 100-10 MG/5ML syrup Commonly known as: ROBITUSSIN DM Take 5 mLs by mouth every 4 (four) hours as needed for cough (chest congestion).   ibuprofen 200 MG tablet Commonly known as: ADVIL Take 200 mg by mouth every 6 (six) hours as needed for headache or moderate pain.   metoprolol tartrate 25 MG tablet Commonly known as: LOPRESSOR TAKE ONE TABLET TWICE DAILY What changed: how much to take   naphazoline-pheniramine 0.025-0.3 % ophthalmic solution Commonly known as: NAPHCON-A Place 1-2 drops into both eyes as needed for eye  irritation or allergies.   nitroGLYCERIN 0.4 MG SL tablet Commonly known as: NITROSTAT Place 1 tablet (0.4 mg total) under the tongue every 5 (five) minutes as needed for chest pain.   pravastatin 40 MG tablet Commonly known as: PRAVACHOL Take 40 mg daily by mouth.   SUPER B COMPLEX & C Tabs Take 1 tablet by mouth daily.   SYSTANE OP Place 1 drop into both eyes daily as needed (dry eyes).   traMADol 50 MG tablet Commonly known as: ULTRAM Take 1 tablet (50 mg total) by mouth every 6 (six) hours as needed for moderate pain or severe pain.   Unisom 25 MG tablet Generic drug: doxylamine (Sleep) Take 25 mg at bedtime by mouth.   Vitamin D-3 25 MCG (1000 UT) Caps Take 1,000 Units daily by mouth.   zolpidem 5 MG tablet Commonly known as: AMBIEN Take 2.5 mg by mouth at bedtime as needed for sleep.            Durable Medical Equipment  (From admission, onward)         Start     Ordered   05/29/20 1246  For home use only DME oxygen  Once       Question Answer Comment  Length of Need Lifetime   Mode or (Route) Nasal cannula   Liters per Minute 2   Frequency Continuous (stationary and portable oxygen unit needed)   Oxygen delivery system Gas  05/29/20 1245          Follow-up Information    Tisovec, Fransico Him, MD. Schedule an appointment as soon as possible for a visit in 1 week(s).   Specialty: Internal Medicine Contact information: [place] [address] [phone]        Martinique, Peter M, MD. Schedule an appointment as soon as possible for a visit in 2 week(s).   Specialty: Cardiology Contact information: [address] [place] [address] [phone]              Allergies  Allergen Reactions  . Other Anaphylaxis    Lobster- "years ago"  . Codeine Itching    Insomnia   . Hydrocodone Itching and Other (See Comments)    Insomnia  . Neosporin [Neomycin-Bacitracin Zn-Polymyx] Rash     Consultations:  None   Procedures/Studies: DG Chest 1 View  Result Date: 05/27/2020 CLINICAL DATA:  Post thoracentesis EXAM: CHEST  1 VIEW COMPARISON:  05/24/2020 FINDINGS: Upper normal heart size. Mediastinal contours and pulmonary vascularity normal. Bronchitic changes with subsegmental atelectasis at minor fissure. Moderate LEFT pleural effusion and LEFT basilar atelectasis unchanged. No new infiltrate or pneumothorax. Bones demineralized. IMPRESSION: Persistent moderate LEFT pleural effusion and basilar atelectasis. Bronchitic changes with subsegmental atelectasis at minor fissure. No pneumothorax following thoracentesis. Electronically Signed   By: Lavonia Dana M.D.   On: 05/27/2020 15:25   CT ANGIO CHEST PE W OR WO CONTRAST  Result Date: 05/26/2020 CLINICAL DATA:  86 year old with respiratory failure. Lung cancer status post radiation. New oxygen requirement. Shortness of breath. Pleural effusion versus pulmonary embolus. EXAM: CT ANGIOGRAPHY CHEST WITH CONTRAST TECHNIQUE: Multidetector CT imaging of the chest was performed using the standard protocol during bolus administration of intravenous contrast. Multiplanar CT image reconstructions and MIPs were obtained to evaluate the vascular anatomy. CONTRAST:  135m OMNIPAQUE IOHEXOL 350 MG/ML SOLN COMPARISON:  Chest radiograph 05/24/2020. Most recent chest CT 04/20/2020 FINDINGS: Cardiovascular: No discrete filling defects in the pulmonary arteries to suggest pulmonary embolus. The left lower lobe pulmonary arteries are atretic but no filling defects are seen. Aortic atherosclerosis without aortic aneurysm. No aortic dissection. Upper normal heart size. Small pericardial effusion is unchanged. Mediastinum/Nodes: AP window lymph node measures 8 mm, series 4, image 41, previously 6 mm. Small lower paratracheal nodes, largest measuring 7 mm. No evidence of hilar adenopathy. No esophageal wall thickening. No thyroid nodule. Lungs/Pleura: Progressive  narrowing with filling of the left lower lobe bronchus, increased bronchial filling from prior. Complete collapse of the left lower lobe again seen. Increased narrowing and short segment occlusion of the lingular bronchus with associated soft tissue density. Left upper lobe bronchus is patent. 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 Right pleural effusion, moderate in size, new from prior exam. No definite pleural thickening or enhancement. Adjacent compressive atelectasis. Chronic volume loss and nodular opacities abutting the right minor fissure, series 10 image 67, unchanged. Stable 5 mm pleural-based nodule in the right middle lobe, series 10, image 84. Mild mosaic pulmonary parenchymal attenuation is again seen. Upper Abdomen: No acute findings. Musculoskeletal: No acute osseous abnormality. No evidence of focal bone lesion. Review of the MIP images confirms the above findings. IMPRESSION: 1. No pulmonary embolus. 2. Right pleural effusion, moderate in size, new from prior exam. No definite pleural thickening or enhancement. 3. Progressive left lower lobe bronchial narrowing, with progressive bronchial filling. Increased narrowing of the lingular bronchus with short segment occlusion. Stable chronic left lung  base pleural effusions/fibrothorax with pleural thickening. Complete left lower lobe collapse/consolidation, unchanged. 4. Small mediastinal lymph nodes remain subcentimeter but slightly increased from prior exam, potentially reactive. Aortic Atherosclerosis (ICD10-I70.0). Electronically Signed   By: Keith Rake M.D.   On: 05/26/2020 18:23   DG Chest Port 1 View  Result Date: 05/28/2020 CLINICAL DATA:  Status post left thoracentesis EXAM: PORTABLE CHEST 1 VIEW COMPARISON:  05/27/2020 FINDINGS: Cardiac shadow is stable. Mild volume loss on the left is noted. Interval thoracentesis has been performed on the left. Decrease in left pleural effusion is  noted. No pneumothorax is seen. Right lung remains clear. IMPRESSION: Decrease in left pleural effusion without evidence of pneumothorax following thoracentesis. Electronically Signed   By: Inez Catalina M.D.   On: 05/28/2020 12:14   DG CHEST PORT 1 VIEW  Result Date: 05/24/2020 CLINICAL DATA:  Increasing shortness of breath EXAM: PORTABLE CHEST 1 VIEW COMPARISON:  05/22/2020 FINDINGS: Single frontal view of the chest demonstrates persistent left pleural effusion and left lung consolidation. The right chest is clear. There is chronic central vascular congestion. No pneumothorax. No acute bony abnormalities. IMPRESSION: 1. Stable left pleural effusion and left lung consolidation. 2. Stable chronic central vascular congestion. Electronically Signed   By: Randa Ngo M.D.   On: 05/24/2020 17:59   DG Chest Portable 1 View  Result Date: 05/22/2020 CLINICAL DATA:  Worsening cough. EXAM: PORTABLE CHEST 1 VIEW COMPARISON:  February 06, 2018 FINDINGS: The lungs are hyperinflated. Mild, diffuse, chronic appearing increased lung markings are seen. Mild linear scarring and/or atelectasis is again seen along the lateral aspect of the mid right lung. There is marked severity opacification of the left lung base. A moderate sized left-sided pleural effusion is noted. A mild-to-moderate amount of left apical pleural fluid is also seen. No pneumothorax is identified. The heart size and mediastinal contours are within normal limits. The visualized skeletal structures are unremarkable. IMPRESSION: 1. Marked severity opacification of the left lung base, likely consistent with atelectasis and/or infiltrate. 2. Moderate sized left pleural effusion with mild to moderate amount of left apical pleural fluid. 3. Mild, diffuse, chronic appearing increased lung markings. Electronically Signed   By: Virgina Norfolk M.D.   On: 05/22/2020 17:53   ECHOCARDIOGRAM COMPLETE  Result Date: 05/29/2020    ECHOCARDIOGRAM REPORT   Patient  Name:   TERRION POBLANO Carpenito Date of Exam: 05/29/2020 Medical Rec #:  [id number]        Height:       65.0 in Accession #:    [id number]       Weight:       131.2 lb Date of Birth:  03-16-1926        BSA:          1.654 m Patient Age:    89 years         BP:           77/63 mmHg Patient Gender: F                HR:           124 bpm. Exam Location:  Inpatient Procedure: 2D Echo, Cardiac Doppler and Color Doppler Indications:    Z16.96 Acute diastolic (congestive) heart failure  History:        Patient has prior history of Echocardiogram examinations, most                 recent 02/24/2020. Abnormal ECG; Risk Factors:Former Smoker.  Tachycardia. History of cancer.  Sonographer:    Sabina Referring Phys: [id number] AMRIT ADHIKARI  Sonographer Comments: Technically difficult study due to poor echo windows. IMPRESSIONS  1. Left ventricular ejection fraction, by estimation, is 60 to 65%. The left ventricle has normal function. The left ventricle has no regional wall motion abnormalities. Left ventricular diastolic parameters are consistent with Grade II diastolic dysfunction (pseudonormalization).  2. Right ventricular systolic function is normal. The right ventricular size is mildly enlarged. There is moderately elevated pulmonary artery systolic pressure. The estimated right ventricular systolic pressure is 24.2 mmHg.  3. Right atrial size was moderately dilated.  4. The mitral valve is normal in structure. Mild mitral valve regurgitation. No evidence of mitral stenosis.  5. Tricuspid valve regurgitation is moderate.  6. The aortic valve is normal in structure. Aortic valve regurgitation is mild. No aortic stenosis is present.  7. The inferior vena cava is normal in size with greater than 50% respiratory variability, suggesting right atrial pressure of 3 mmHg. FINDINGS  Left Ventricle: Left ventricular ejection fraction, by estimation, is 60 to 65%. The left ventricle has normal function. The left ventricle  has no regional wall motion abnormalities. The left ventricular internal cavity size was normal in size. There is  no left ventricular hypertrophy. Left ventricular diastolic parameters are consistent with Grade II diastolic dysfunction (pseudonormalization). Right Ventricle: The right ventricular size is mildly enlarged. No increase in right ventricular wall thickness. Right ventricular systolic function is normal. There is moderately elevated pulmonary artery systolic pressure. The tricuspid regurgitant velocity is 3.00 m/s, and with an assumed right atrial pressure of 15 mmHg, the estimated right ventricular systolic pressure is 68.3 mmHg. Left Atrium: Left atrial size was normal in size. Right Atrium: Right atrial size was moderately dilated. Pericardium: There is no evidence of pericardial effusion. Mitral Valve: The mitral valve is normal in structure. Normal mobility of the mitral valve leaflets. Mild mitral valve regurgitation. No evidence of mitral valve stenosis. Tricuspid Valve: The tricuspid valve is normal in structure. Tricuspid valve regurgitation is moderate . No evidence of tricuspid stenosis. Aortic Valve: The aortic valve is normal in structure. Aortic valve regurgitation is mild. Aortic regurgitation PHT measures 230 msec. No aortic stenosis is present. Pulmonic Valve: The pulmonic valve was normal in structure. Pulmonic valve regurgitation is not visualized. No evidence of pulmonic stenosis. Aorta: The aortic root is normal in size and structure. Venous: The inferior vena cava is normal in size with greater than 50% respiratory variability, suggesting right atrial pressure of 3 mmHg. IAS/Shunts: No atrial level shunt detected by color flow Doppler.  LEFT VENTRICLE PLAX 2D LVIDd:         3.82 cm     Diastology LVIDs:         2.38 cm     LV e' lateral:   5.66 cm/s LV PW:         0.93 cm     LV E/e' lateral: 24.7 LV IVS:        1.00 cm     LV e' medial:    5.22 cm/s LVOT diam:     1.50 cm     LV  E/e' medial:  26.8 LV SV:         36 LV SV Index:   22 LVOT Area:     1.77 cm  LV Volumes (MOD) LV vol d, MOD A2C: 40.0 ml LV vol d, MOD A4C: 33.1 ml LV vol s, MOD A2C:  21.5 ml LV vol s, MOD A4C: 10.8 ml LV SV MOD A2C:     18.5 ml LV SV MOD A4C:     33.1 ml LV SV MOD BP:      21.0 ml RIGHT VENTRICLE            IVC RV S prime:     9.46 cm/s  IVC diam: 1.81 cm TAPSE (M-mode): 1.9 cm LEFT ATRIUM             Index       RIGHT ATRIUM           Index LA diam:        3.40 cm 2.06 cm/m  RA Area:     16.20 cm LA Vol (A2C):   37.6 ml 22.74 ml/m RA Volume:   48.30 ml  29.21 ml/m LA Vol (A4C):   37.8 ml 22.86 ml/m LA Biplane Vol: 40.3 ml 24.37 ml/m  AORTIC VALVE LVOT Vmax:   103.00 cm/s LVOT Vmean:  78.100 cm/s LVOT VTI:    0.202 m AI PHT:      230 msec  AORTA Ao Root diam: 2.90 cm Ao Asc diam:  2.80 cm MITRAL VALVE                TRICUSPID VALVE MV Area (PHT): 4.60 cm     TR Peak grad:   36.0 mmHg MV Decel Time: 165 msec     TR Vmax:        300.00 cm/s MV E velocity: 140.00 cm/s MV A velocity: 106.00 cm/s  SHUNTS MV E/A ratio:  1.32         Systemic VTI:  0.20 m                             Systemic Diam: 1.50 cm Candee Furbish MD Electronically signed by Candee Furbish MD Signature Date/Time: 05/29/2020/1:27:44 PM    Final    US THORACENTESIS ASP PLEURAL SPACE W/IMG GUIDE  Result Date: 05/28/2020 INDICATION: Patient with history of lung cancer s/p radiation with dyspnea and bilateral pleural effusions s/p right thoracentesis in IR 05/27/2020 yielding 300 mL. Request is made for therapeutic left thoracentesis. EXAM: ULTRASOUND GUIDED THERAPEUTIC LEFT THORACENTESIS MEDICATIONS: 10 mL 1% lidocaine COMPLICATIONS: None immediate. PROCEDURE: An ultrasound guided thoracentesis was thoroughly discussed with the patient and questions answered. The benefits, risks, alternatives and complications were also discussed. The patient understands and wishes to proceed with the procedure. Written consent was obtained. Ultrasound was  performed to localize and mark an adequate pocket of fluid in the left chest. The area was then prepped and draped in the normal sterile fashion. 1% Lidocaine was used for local anesthesia. Under ultrasound guidance a 6 Fr Safe-T-Centesis catheter was introduced. Thoracentesis was performed. The catheter was removed and a dressing applied. FINDINGS: A total of approximately 300 mL of hazy amber fluid was removed. Procedure was stopped after 300 mL due to patient symptoms (development of chest pain). IMPRESSION: Successful ultrasound guided left thoracentesis yielding 300 mL of pleural fluid. Read by: Earley Abide, PA-C Electronically Signed   By: Aletta Edouard M.D.   On: 05/28/2020 12:21   US THORACENTESIS ASP PLEURAL SPACE W/IMG GUIDE  Result Date: 05/27/2020 INDICATION: Patient with history of lung cancer status post radiation. New symptoms of shortness of breath. Bilateral pleural effusions. Request for therapeutic and diagnostic right thoracentesis. EXAM: ULTRASOUND GUIDED RIGHT THORACENTESIS MEDICATIONS: 1% lidocaine 10 mL COMPLICATIONS:  None immediate. PROCEDURE: An ultrasound guided thoracentesis was thoroughly discussed with the patient and questions answered. The benefits, risks, alternatives and complications were also discussed. The patient understands and wishes to proceed with the procedure. Written consent was obtained. Ultrasound was performed to localize and mark an adequate pocket of fluid in the right chest. The area was then prepped and draped in the normal sterile fashion. 1% Lidocaine was used for local anesthesia. Under ultrasound guidance a 6 Fr Safe-T-Centesis catheter was introduced. Thoracentesis was performed. The catheter was removed and a dressing applied. FINDINGS: A total of approximately 300 mL of orange fluid was removed. Samples were sent to the laboratory as requested by the clinical team. IMPRESSION: Successful ultrasound guided right thoracentesis yielding 300 mL of  pleural fluid. No pneumothorax on post-procedure chest x-ray. Patient also has left pleural effusion and could possibly benefit from left thoracentesis if clinically indicated. Read by: Gareth Eagle, PA-C Electronically Signed   By: Jacqulynn Cadet M.D.   On: 05/27/2020 15:28      Subjective:  Patient seen and examined at the bedside this morning.  Hemodynamically stable for discharge. Discharge Exam: Vitals:   05/29/20 0929 05/29/20 1302  BP: (!) 97/51 (!) 85/58  Pulse: (!) 125 (!) 104  Resp: 18 19  Temp: 98.4 F (36.9 C) 98.7 F (37.1 C)  SpO2: 99% 99%   Vitals:   05/29/20 0530 05/29/20 0744 05/29/20 0929 05/29/20 1302  BP:   (!) 97/51 (!) 85/58  Pulse: 100  (!) 125 (!) 104  Resp:   18 19  Temp:   98.4 F (36.9 C) 98.7 F (37.1 C)  TempSrc:   Oral Oral  SpO2:  98% 99% 99%  Weight:      Height:        General: Pt is alert, awake, not in acute distress Cardiovascular: RRR, S1/S2 +, no rubs, no gallops Respiratory: CTA bilaterally, no wheezing, no rhonchi Abdominal: Soft, NT, ND, bowel sounds + Extremities: no edema, no cyanosis    The results of significant diagnostics from this hospitalization (including imaging, microbiology, ancillary and laboratory) are listed below for reference.     Microbiology: Recent Results (from the past 240 hour(s))  SARS Coronavirus 2 by RT PCR (hospital order, performed in [facility] hospital lab) Nasopharyngeal Nasopharyngeal Swab     Status: None   Collection Time: 05/22/20  5:04 PM   Specimen: Nasopharyngeal Swab  Result Value Ref Range Status   SARS Coronavirus 2 NEGATIVE NEGATIVE Final    Comment: (NOTE) SARS-CoV-2 target nucleic acids are NOT DETECTED. The SARS-CoV-2 RNA is generally detectable in upper and lower respiratory specimens during the acute phase of infection. The lowest concentration of SARS-CoV-2 viral copies this assay can detect is 250 copies / mL. A negative result does not preclude SARS-CoV-2  infection and should not be used as the sole basis for treatment or other patient management decisions.  A negative result may occur with improper specimen collection / handling, submission of specimen other than nasopharyngeal swab, presence of viral mutation(s) within the areas targeted by this assay, and inadequate number of viral copies (<250 copies / mL). A negative result must be combined with clinical observations, patient history, and epidemiological information. Fact Sheet for Patients:   StrictlyIdeas.no Fact Sheet for Healthcare Providers: BankingDealers.co.za This test is not yet approved or cleared  by the [place] FDA and has been authorized for detection and/or diagnosis of SARS-CoV-2 by FDA under an Emergency Use Authorization (EUA).  This  EUA will remain in effect (meaning this test can be used) for the duration of the COVID-19 declaration under Section 564(b)(1) of the Act, 21 U.S.C. section 360bbb-3(b)(1), unless the authorization is terminated or revoked sooner. Performed at [facility], [place] [address] [place], [place] [address]   MRSA PCR Screening     Status: None   Collection Time: 05/24/20  5:51 PM   Specimen: Nasal Mucosa; Nasopharyngeal  Result Value Ref Range Status   MRSA by PCR NEGATIVE NEGATIVE Final    Comment:        The GeneXpert MRSA Assay (FDA approved for NASAL specimens only), is one component of a comprehensive MRSA colonization surveillance program. It is not intended to diagnose MRSA infection nor to guide or monitor treatment for MRSA infections. Performed at [facility], [place] [address], [place], [place] [address]   Culture, body fluid-bottle     Status: None (Preliminary result)   Collection Time: 05/27/20  2:48 PM   Specimen: Fluid  Result Value Ref Range Status   Specimen Description FLUID PLEURAL  Final   Special Requests BOTTLES DRAWN  AEROBIC AND ANAEROBIC  Final   Culture   Final    NO GROWTH 2 DAYS Performed at [facility], [address], [place], [place] [address]    Report Status PENDING  Incomplete  Gram stain     Status: None   Collection Time: 05/27/20  2:48 PM   Specimen: Fluid  Result Value Ref Range Status   Specimen Description FLUID PLEURAL  Final   Special Requests NONE  Final   Gram Stain   Final    ABUNDANT WBC PRESENT, PREDOMINANTLY PMN NO ORGANISMS SEEN Performed at [facility], [place] [address], [place], [place] [address]    Report Status 05/28/2020 FINAL  Final     Labs: BNP (last 3 results) Recent Labs    05/22/20 1823  BNP 917.9*   Basic Metabolic Panel: Recent Labs  Lab 05/25/20 0646 05/25/20 1102 05/25/20 2231 05/26/20 0228 05/27/20 0103 05/28/20 0541 05/29/20 0529  NA 136   < > 135 130*  132* 127* 132* 133*  K 4.1  --   --  3.8 3.8 4.7 4.6  CL 94*  --   --  92* 90* 93* 92*  CO2 34*  --   --  _0 32  GLUCOSE 109*  --   --  94 132* 106* 108*  BUN 9  --   --  _1 CREATININE 0.66  --   --  0.50 0.59 0.55 0.62  CALCIUM 9.2  --   --  8.6* 8.6* 9.0 9.2   < > = values in this interval not displayed.   Liver Function Tests: Recent Labs  Lab 05/23/20 0500 05/23/20 0911  AST 33 37  ALT 41 46*  ALKPHOS 73 79  BILITOT 0.8 0.9  PROT 6.0* 6.6  ALBUMIN 3.4* 3.8   No results for input(s): LIPASE, AMYLASE in the last 168 hours. No results for input(s): AMMONIA in the last 168 hours. CBC: Recent Labs  Lab 05/22/20 1706 05/23/20 0500  WBC 6.8 5.2  NEUTROABS 5.8  --   HGB 12.6 11.1*  HCT 37.8 33.2*  MCV 84.2 85.6  PLT 177 153   Cardiac Enzymes: No results for input(s): CKTOTAL, CKMB, CKMBINDEX, TROPONINI in the last 168 hours. BNP: Invalid input(s): POCBNP CBG: No results for input(s): GLUCAP in the last 168  hours. D-Dimer No results for input(s): DDIMER in the last 72 hours. Hgb A1c No results for input(s): HGBA1C in the last 72  hours. Lipid Profile No results for input(s): CHOL, HDL, LDLCALC, TRIG, CHOLHDL, LDLDIRECT in the last 72 hours. Thyroid function studies Recent Labs    05/28/20 1035  TSH 1.890   Anemia work up No results for input(s): VITAMINB12, FOLATE, FERRITIN, TIBC, IRON, RETICCTPCT in the last 72 hours. Urinalysis    Component Value Date/Time   COLORURINE STRAW (A) 02/06/2018 1702   APPEARANCEUR CLEAR 02/06/2018 1702   LABSPEC 1.001 (L) 02/06/2018 1702   PHURINE 7.0 02/06/2018 1702   GLUCOSEU NEGATIVE 02/06/2018 1702   HGBUR NEGATIVE 02/06/2018 1702   BILIRUBINUR NEGATIVE 02/06/2018 1702   KETONESUR 5 (A) 02/06/2018 1702   PROTEINUR NEGATIVE 02/06/2018 1702   NITRITE NEGATIVE 02/06/2018 1702   LEUKOCYTESUR NEGATIVE 02/06/2018 1702   Sepsis Labs Invalid input(s): PROCALCITONIN,  WBC,  LACTICIDVEN Microbiology Recent Results (from the past 240 hour(s))  SARS Coronavirus 2 by RT PCR (hospital order, performed in [place] hospital lab) Nasopharyngeal Nasopharyngeal Swab     Status: None   Collection Time: 05/22/20  5:04 PM   Specimen: Nasopharyngeal Swab  Result Value Ref Range Status   SARS Coronavirus 2 NEGATIVE NEGATIVE Final    Comment: (NOTE) SARS-CoV-2 target nucleic acids are NOT DETECTED. The SARS-CoV-2 RNA is generally detectable in upper and lower respiratory specimens during the acute phase of infection. The lowest concentration of SARS-CoV-2 viral copies this assay can detect is 250 copies / mL. A negative result does not preclude SARS-CoV-2 infection and should not be used as the sole basis for treatment or other patient management decisions.  A negative result may occur with improper specimen collection / handling, submission of specimen other than nasopharyngeal swab, presence of viral mutation(s) within the areas targeted by this assay, and inadequate number of viral copies (<250 copies / mL). A negative result must be combined with clinical observations, patient  history, and epidemiological information. Fact Sheet for Patients:   StrictlyIdeas.no Fact Sheet for Healthcare Providers: BankingDealers.co.za This test is not yet approved or cleared  by the [place] FDA and has been authorized for detection and/or diagnosis of SARS-CoV-2 by FDA under an Emergency Use Authorization (EUA).  This EUA will remain in effect (meaning this test can be used) for the duration of the COVID-19 declaration under Section 564(b)(1) of the Act, 21 U.S.C. section 360bbb-3(b)(1), unless the authorization is terminated or revoked sooner. Performed at [facility], [place] [address], [place], [place] [address]   MRSA PCR Screening     Status: None   Collection Time: 05/24/20  5:51 PM   Specimen: Nasal Mucosa; Nasopharyngeal  Result Value Ref Range Status   MRSA by PCR NEGATIVE NEGATIVE Final    Comment:        The GeneXpert MRSA Assay (FDA approved for NASAL specimens only), is one component of a comprehensive MRSA colonization surveillance program. It is not intended to diagnose MRSA infection nor to guide or monitor treatment for MRSA infections. Performed at [facility], [place] [address] [place], [place] [address]   Culture, body fluid-bottle     Status: None (Preliminary result)   Collection Time: 05/27/20  2:48 PM   Specimen: Fluid  Result Value Ref Range Status   Specimen Description FLUID PLEURAL  Final   Special Requests BOTTLES DRAWN AEROBIC AND ANAEROBIC  Final   Culture   Final  NO GROWTH 2 DAYS Performed at [facility], [place] [address], [place], [place] [address]    Report Status PENDING  Incomplete  Gram stain     Status: None   Collection Time: 05/27/20  2:48 PM   Specimen: Fluid  Result Value Ref Range Status   Specimen Description FLUID PLEURAL  Final   Special Requests NONE  Final   Gram Stain   Final    ABUNDANT WBC PRESENT, PREDOMINANTLY  PMN NO ORGANISMS SEEN Performed at [facility], [place] [address], [place], [place] [address]    Report Status 05/28/2020 FINAL  Final    Please note: You were cared for by a hospitalist during your hospital stay. Once you are discharged, your primary care physician will handle any further medical issues. Please note that NO REFILLS for any discharge medications will be authorized once you are discharged, as it is imperative that you return to your primary care physician (or establish a relationship with a primary care physician if you do not have one) for your post hospital discharge needs so that they can reassess your need for medications and monitor your lab values.    Time coordinating discharge: 40 minutes  SIGNED:   Shelly Coss, MD  [facility] 05/29/2020, 1:37 PM Pager [phone]  If 7PM-7AM, please contact night-coverage [email or web address] Password [email or web address]

## 2020-05-29 NOTE — Progress Notes (Signed)
Patient is discharged back to her assisted living facility. Discharge instructions is given to patient and appointment reviewed with patient. Patient was given oxygen portable and a container and walker to take home. Patient verbalizes understanding.

## 2020-05-29 NOTE — Progress Notes (Addendum)
   05/29/20 0929  Assess: MEWS Score  Temp 98.4 F (36.9 C)  BP (!) 97/51  Pulse Rate (!) 125  SpO2 99 %  O2 Device Nasal Cannula  O2 Flow Rate (L/min) 2 L/min  Assess: MEWS Score  MEWS Temp 0  MEWS Systolic 1  MEWS Pulse 2  MEWS RR 0  MEWS LOC 0  MEWS Score 3  MEWS Score Color Yellow  Assess: if the MEWS score is Yellow or Red  Were vital signs taken at a resting state? Yes  Focused Assessment Documented focused assessment  Early Detection of Sepsis Score *See Row Information* Low  MEWS guidelines implemented *See Row Information* No, previously yellow, continue vital signs every 4 hours  Treat  MEWS Interventions Administered scheduled meds/treatments  Escalate  MEWS: Escalate Yellow: discuss with charge nurse/RN and consider discussing with provider and RRT  Notify: Provider  Provider Name/Title Adhikari, MD  Date Provider Notified 05/29/20  Time Provider Notified 1000  Notification Type Face-to-face  Notification Reason Change in status (low BP, High HR)  Response No new orders  Date of Provider Response 05/29/20  Time of Provider Response 1000   Advised to proceed with AM dose of lopressor by MD.

## 2020-05-29 NOTE — Plan of Care (Signed)
  Problem: Health Behavior/Discharge Planning: Goal: Ability to manage health-related needs will improve Outcome: Completed/Met   Problem: Clinical Measurements: Goal: Ability to maintain clinical measurements within normal limits will improve Outcome: Completed/Met Goal: Will remain free from infection Outcome: Completed/Met Goal: Diagnostic test results will improve Outcome: Completed/Met Goal: Respiratory complications will improve Outcome: Completed/Met Goal: Cardiovascular complication will be avoided Outcome: Completed/Met   Problem: Activity: Goal: Risk for activity intolerance will decrease Outcome: Completed/Met   Problem: Nutrition: Goal: Adequate nutrition will be maintained Outcome: Completed/Met   Problem: Coping: Goal: Level of anxiety will decrease Outcome: Completed/Met   Problem: Pain Managment: Goal: General experience of comfort will improve Outcome: Completed/Met   Problem: Safety: Goal: Ability to remain free from injury will improve Outcome: Completed/Met   Problem: Skin Integrity: Goal: Risk for impaired skin integrity will decrease Outcome: Completed/Met

## 2020-06-01 LAB — CULTURE, BODY FLUID W GRAM STAIN -BOTTLE: Culture: NO GROWTH

## 2020-06-03 ENCOUNTER — Non-Acute Institutional Stay (SKILLED_NURSING_FACILITY): Payer: Medicare Other | Admitting: Internal Medicine

## 2020-06-03 ENCOUNTER — Encounter: Payer: Self-pay | Admitting: Internal Medicine

## 2020-06-03 DIAGNOSIS — M810 Age-related osteoporosis without current pathological fracture: Secondary | ICD-10-CM | POA: Diagnosis not present

## 2020-06-03 DIAGNOSIS — C50211 Malignant neoplasm of upper-inner quadrant of right female breast: Secondary | ICD-10-CM

## 2020-06-03 DIAGNOSIS — R531 Weakness: Secondary | ICD-10-CM

## 2020-06-03 DIAGNOSIS — J9601 Acute respiratory failure with hypoxia: Secondary | ICD-10-CM

## 2020-06-03 DIAGNOSIS — E871 Hypo-osmolality and hyponatremia: Secondary | ICD-10-CM

## 2020-06-03 DIAGNOSIS — Z170 Estrogen receptor positive status [ER+]: Secondary | ICD-10-CM

## 2020-06-03 DIAGNOSIS — G479 Sleep disorder, unspecified: Secondary | ICD-10-CM

## 2020-06-03 NOTE — Progress Notes (Signed)
Provider:  Rexene Edison. Mariea Clonts, D.O., C.M.D. Location:  [facility] Room Number: 159 A Place of Service:  SNF (31)  PCP: Tisovec, Fransico Him, MD Patient Care Team: Tisovec, Fransico Him, MD as PCP - General (Internal Medicine) Martinique, Peter M, MD as PCP - Cardiology (Cardiology)  Extended Emergency Contact Information Primary Emergency Contact: Eloy [place] of [place] Phone: [phone] Relation: Daughter Secondary Emergency Contact: Miles Costain [place] of [place] Phone: [phone] Mobile Phone: [phone] Relation: Son  Code Status: DNR (new form completed as not returned with resident from hospital)  Goals of Care: Advanced Directive information Advanced Directives 06/03/2020  Does Patient Have a Medical Advance Directive? Yes  Type of Advance Directive Out of facility DNR (pink MOST or yellow form)  Does patient want to make changes to medical advance directive? No - Guardian declined  Copy of [place] in Chart? -  Would patient like information on creating a medical advance directive? No - Patient declined  Pre-existing out of facility DNR order (yellow form or pink MOST form) Pink MOST form placed in chart (order not valid for inpatient use)   Chief Complaint  Patient presents with  . Readmit To SNF    Admission for Rehab    HPI: Patient is a 86 y.o. female with h/o lung and breast ca on chemo, anemia of chronic diseases, diverticulosis, meningioma seen today for admission to rehab s/p hospitalization 6/4-6/11/21 with hyponatremia, acute hypoxic respiratory failure with left pleural effusions, inappropriate sinus tachycardia, h/o of non-small cell lung ca of right lung, and right breast ca on herceptin and adjuvant anastrozole.    She'd been having respiratory symptoms for 3-4 days and given abx for cough and sore throat by PCP.  She had a negative chest xray outpatient.  When she was  admitted 6/4 to the hospital, she was found to have Na 120, chest xray with opacification of the left base vs inflitrate vs moderate pleural effusion.    When seen, she reported doing a lot better.  She is eager to get off oxygen and get home.  She has been walking around the unit and went to get her hair done.  She thinks she is just about recovered.   Past Medical History:  Diagnosis Date  . Allergic rhinitis   . Anemia    during pregnancy  . Arthritis   . Breast cancer ([place]) 05/08/13   right upper inner, invasive mammary  . Breast cancer, right breast ([place]) 04/16/2013   Underwent lumpectomy on 11/06/13. Path showed G1 ILC, 2.7 cm, neg margins, receptor+, Her2neg   . Diverticulosis   . Dyspnea    when carry heavy packages  . Dysrhythmia    Sinus Tach- on Metoprolol  . 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 Hemorrhoids   . Hx of adenomatous colonic polyps 05/2002  . Lung cancer ([place])   . Lung cancer ([place]) 10/2017  . Meningioma ([place])   . Osteoporosis   . Pneumonia     x2 last time was 2019  . Rosacea   . Skin cancer   . Tachycardia   . 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 Use of letrozole ([place])    neoadjuvant antiestrogen therapy with letrozole 2.5 mg daily x 7 monhts   Past Surgical History:  Procedure Laterality Date  . BREAST BIOPSY Left 1960   lt br bx/benign  . BREAST LUMPECTOMY Right   . BREAST LUMPECTOMY  Right 02/10/2020   Procedure: RIGHT BREAST LUMPECTOMY;  Surgeon: Coralie Keens, MD;  Location: [facility];  Service: General;  Laterality: Right;  . BREAST LUMPECTOMY WITH NEEDLE LOCALIZATION Bilateral 11/06/2013   Procedure: BREAST LUMPECTOMY WITH NEEDLE LOCALIZATION;  Surgeon: Haywood Lasso, MD;  Location: [facility];  Service: General;  Laterality: Bilateral;  . COLONOSCOPY    . EYE SURGERY Bilateral    both cataracts  . MOHS SURGERY Right    nose basal/squamous  . TOTAL HIP ARTHROPLASTY Left 08/31/2018   Procedure: LEFT TOTAL HIP  ARTHROPLASTY ANTERIOR APPROACH;  Surgeon: Mcarthur Rossetti, MD;  Location: [facility];  Service: Orthopedics;  Laterality: Left;  Marland Kitchen VIDEO BRONCHOSCOPY WITH ENDOBRONCHIAL NAVIGATION N/A 11/01/2017   Procedure: VIDEO BRONCHOSCOPY WITH ENDOBRONCHIAL NAVIGATION;  Surgeon: [name], MD;  Location: [facility];  Service: Thoracic;  Laterality: N/A;  . WRIST SURGERY  1990   lt    Social History   Socioeconomic History  . Marital status: Divorced    Spouse name: Not on file  . Number of children: 2  . Years of education: Not on file  . Highest education level: Not on file  Occupational History  . Occupation: Retired  Tobacco Use  . Smoking status: Former Smoker    Packs/day: 0.50    Years: 25.00    Pack years: 12.50    Types: Cigarettes    Quit date: 12/19/1978    Years since quitting: 41.5  . Smokeless tobacco: Never Used  Vaping Use  . Vaping Use: Never used  Substance and Sexual Activity  . Alcohol use: Yes    Comment: "wine usually, no taste in the last in past few weeks- (02/07/2020)  . Drug use: No  . Sexual activity: Not Currently    Comment: menarche at age14, menopause in 34, HRT x 15, age at first live birth 89's, G80 P3  Other Topics Concern  . Not on file  Social History Narrative  . Not on file   Social Determinants of Health   Financial Resource Strain:   . Difficulty of Paying Living Expenses:   Food Insecurity:   . Worried About Charity fundraiser in the Last Year:   . Arboriculturist in the Last Year:   Transportation Needs:   . Film/video editor (Medical):   Marland Kitchen Lack of Transportation (Non-Medical):   Physical Activity:   . Days of Exercise per Week:   . Minutes of Exercise per Session:   Stress:   . Feeling of Stress :   Social Connections:   . Frequency of Communication with Friends and Family:   . Frequency of Social Gatherings with Friends and Family:   . Attends Religious Services:   . Active Member of Clubs or Organizations:   .  Attends Archivist Meetings:   Marland Kitchen Marital Status:     reports that she quit smoking about 41 years ago. Her smoking use included cigarettes. She has a 12.50 pack-year smoking history. She has never used smokeless tobacco. She reports current alcohol use. She reports that she does not use drugs.  Functional Status Survey:    Family History  Problem Relation Age of Onset  . Heart disease Mother   . Prostate cancer Father   . Lung cancer Sister     Health Maintenance  Topic Date Due  . DEXA SCAN  Never done  . COLONOSCOPY  12/07/2014  . INFLUENZA VACCINE  07/19/2020  . TETANUS/TDAP  04/11/2028  . COVID-19 Vaccine  Completed  . PNA vac Low Risk Adult  Completed    Allergies  Allergen Reactions  . Other Anaphylaxis    Lobster- "years ago"  . Codeine Itching    Insomnia   . Hydrocodone Itching and Other (See Comments)    Insomnia  . Neosporin [Neomycin-Bacitracin Zn-Polymyx] Rash    Outpatient Encounter Medications as of 06/03/2020  Medication Sig  . acetaminophen (TYLENOL) 500 MG tablet Take 500 mg by mouth every 6 (six) hours as needed for moderate pain or headache.  . anastrozole (ARIMIDEX) 1 MG tablet Take 0.5 tablets (0.5 mg total) by mouth daily.  . Artificial Tear Solution (BION TEARS OP) Place 1-2 drops into both eyes as needed (dry eyes).   Marland Kitchen aspirin 81 MG tablet Take 81 mg by mouth daily.  . cetirizine (ZYRTEC) 10 MG tablet Take 10 mg daily by mouth.   . Cholecalciferol (VITAMIN D-3) 1000 UNITS CAPS Take 1,000 Units daily by mouth.   . doxylamine, Sleep, (UNISOM) 25 MG tablet Take 25 mg at bedtime by mouth.  Marland Kitchen Glucosamine-Chondroit-MSM-C-Mn CAPS Take 1 capsule by mouth daily.  Marland Kitchen guaiFENesin-dextromethorphan (ROBITUSSIN DM) 100-10 MG/5ML syrup Take 5 mLs by mouth every 4 (four) hours as needed for cough (chest congestion).  . Menthol, Topical Analgesic, (BIOFREEZE EX) Apply 1 application topically daily as needed (leg pain).  . metoprolol tartrate  (LOPRESSOR) 25 MG tablet TAKE ONE TABLET TWICE DAILY  . naphazoline-pheniramine (NAPHCON-A) 0.025-0.3 % ophthalmic solution Place 1-2 drops into both eyes as needed for eye irritation or allergies.   . nitroGLYCERIN (NITROSTAT) 0.4 MG SL tablet Place 0.4 mg under the tongue every 5 (five) minutes as needed for chest pain.  Vladimir Faster Glycol-Propyl Glycol (SYSTANE OP) Place 1 drop into both eyes daily as needed (dry eyes).  . pravastatin (PRAVACHOL) 40 MG tablet Take 40 mg daily by mouth.   . Probiotic Product (ALIGN) 4 MG CAPS Take 4 mg daily by mouth.  . SUPER B COMPLEX & C TABS Take 1 tablet by mouth daily.  . traMADol (ULTRAM) 50 MG tablet Take 1 tablet (50 mg total) by mouth every 6 (six) hours as needed for moderate pain or severe pain.  Marland Kitchen zolpidem (AMBIEN) 5 MG tablet Take 2.5 mg by mouth at bedtime as needed for sleep.   . [DISCONTINUED] ibuprofen (ADVIL) 200 MG tablet Take 200 mg by mouth every 6 (six) hours as needed for headache or moderate pain.  . [DISCONTINUED] nitroGLYCERIN (NITROSTAT) 0.4 MG SL tablet Place 1 tablet (0.4 mg total) under the tongue every 5 (five) minutes as needed for chest pain. (Patient not taking: Reported on 05/22/2020)   No facility-administered encounter medications on file as of 06/03/2020.    Review of Systems  Constitutional: Negative for chills and fever.  HENT: Negative for congestion and sore throat.   Respiratory: Positive for shortness of breath. Negative for cough, sputum production and wheezing.   Cardiovascular: Negative for chest pain, palpitations and leg swelling.  Gastrointestinal: Negative for abdominal pain, blood in stool, constipation, diarrhea and melena.  Genitourinary: Negative for dysuria.  Musculoskeletal: Negative for falls and joint pain.  Skin: Negative for rash.  Neurological: Negative for dizziness and loss of consciousness.  Endo/Heme/Allergies: Bruises/bleeds easily.  Psychiatric/Behavioral: Negative for depression and memory  loss. The patient is not nervous/anxious and does not have insomnia.     Vitals:   06/03/20 1540  BP: 99/65  Pulse: (!) 110  Temp: (!) 97.2 F (36.2  C)  SpO2: 97%  Weight: 137 lb (62.1 kg)  Height: 5\' 5"  (1.651 m)   Body mass index is 22.8 kg/m. Physical Exam Vitals reviewed.  Constitutional:      General: She is not in acute distress.    Appearance: Normal appearance. She is not toxic-appearing.  HENT:     Head: Normocephalic and atraumatic.     Right Ear: External ear normal.     Left Ear: External ear normal.     Nose: Nose normal.     Mouth/Throat:     Pharynx: Oropharynx is clear.  Eyes:     Extraocular Movements: Extraocular movements intact.     Conjunctiva/sclera: Conjunctivae normal.     Pupils: Pupils are equal, round, and reactive to light.  Cardiovascular:     Rate and Rhythm: Normal rate and regular rhythm.     Pulses: Normal pulses.     Heart sounds: Normal heart sounds.  Pulmonary:     Effort: Pulmonary effort is normal.     Breath sounds: Rhonchi present.  Abdominal:     General: Bowel sounds are normal.  Musculoskeletal:        General: Normal range of motion.     Cervical back: Neck supple.     Right lower leg: No edema.     Left lower leg: No edema.  Skin:    General: Skin is warm and dry.  Neurological:     General: No focal deficit present.     Mental Status: She is alert and oriented to person, place, and time.     Cranial Nerves: No cranial nerve deficit.     Motor: Weakness present.     Gait: Gait normal.  Psychiatric:        Mood and Affect: Mood normal.        Behavior: Behavior normal.        Thought Content: Thought content normal.        Judgment: Judgment normal.     Labs reviewed: Basic Metabolic Panel: Recent Labs    05/27/20 0103 05/28/20 0541 05/29/20 0529  NA 127* 132* 133*  K 3.8 4.7 4.6  CL 90* 93* 92*  CO2 29 30 32  GLUCOSE 132* 106* 108*  BUN 15 9 16   CREATININE 0.59 0.55 0.62  CALCIUM 8.6* 9.0 9.2    Liver Function Tests: Recent Labs    04/20/20 1032 05/23/20 0500 05/23/20 0911  AST 20 33 37  ALT 31 41 46*  ALKPHOS 85 73 79  BILITOT 0.8 0.8 0.9  PROT 7.2 6.0* 6.6  ALBUMIN 3.8 3.4* 3.8   No results for input(s): LIPASE, AMYLASE in the last 8760 hours. No results for input(s): AMMONIA in the last 8760 hours. CBC: Recent Labs    04/20/20 1032 05/22/20 1706 05/23/20 0500  WBC 5.9 6.8 5.2  NEUTROABS 4.0 5.8  --   HGB 13.8 12.6 11.1*  HCT 43.6 37.8 33.2*  MCV 88.4 84.2 85.6  PLT 210 177 153   Cardiac Enzymes: No results for input(s): CKTOTAL, CKMB, CKMBINDEX, TROPONINI in the last 8760 hours. BNP: Invalid input(s): POCBNP Lab Results  Component Value Date   HGBA1C 5.6 12/25/2017   Lab Results  Component Value Date   TSH 1.890 05/28/2020   No results found for: VITAMINB12 No results found for: FOLATE No results found for: IRON, TIBC, FERRITIN  Imaging and Procedures obtained prior to SNF admission: DG Chest Portable 1 View  Result Date: 05/22/2020 CLINICAL DATA:  Worsening cough.  EXAM: PORTABLE CHEST 1 VIEW COMPARISON:  February 06, 2018 FINDINGS: The lungs are hyperinflated. Mild, diffuse, chronic appearing increased lung markings are seen. Mild linear scarring and/or atelectasis is again seen along the lateral aspect of the mid right lung. There is marked severity opacification of the left lung base. A moderate sized left-sided pleural effusion is noted. A mild-to-moderate amount of left apical pleural fluid is also seen. No pneumothorax is identified. The heart size and mediastinal contours are within normal limits. The visualized skeletal structures are unremarkable. IMPRESSION: 1. Marked severity opacification of the left lung base, likely consistent with atelectasis and/or infiltrate. 2. Moderate sized left pleural effusion with mild to moderate amount of left apical pleural fluid. 3. Mild, diffuse, chronic appearing increased lung markings. Electronically Signed    By: Virgina Norfolk M.D.   On: 05/22/2020 17:53    Assessment/Plan 1. 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 Weakness generalized -here for PT, OT after hospitalization with goal to return home to [place]  3. Acute respiratory failure with hypoxia (HCC) -treated for left-sided pneumonia with recovery, remains on O2 and has chronic left pleural effusion due to her breast radiation--it was transudative when tapped  4. Senile osteoporosis -continues on vitamin D  5. Malignant neoplasm of upper-inner quadrant of right breast in female, estrogen receptor positive ([place]) -ER/PR positive, Her-2neu negative, s/p right lumpectomy 2019 -was actively on chemo prior to admission--taking herceptin and home adjuvant anastrazole, follows with Dr. Lindi Adie  6. Sleep disturbances -insists upon have her ambien to rest that she took at home and was given  Pharmacist, hospital Communication: discussed with son in room--goal for pt is to wean from O2 and go home  Labs/tests ordered:  Needs f/u bmp certainly before d/c home and f/u cardiology upon d/c from rehab  [place]. Sheena Simonis, D.O. [facility] [address], [place] [address] Cell Phone (Mon-Fri 8am-5pm):  [phone] On Call:  [phone] & follow prompts after 5pm & weekends Office Phone:  [phone] Office Fax:  [phone]

## 2020-06-10 NOTE — Progress Notes (Signed)
Pharmacist Chemotherapy Monitoring - Follow Up Assessment    I verify that I have reviewed each item in the below checklist:  . Regimen for the patient is scheduled for the appropriate day and plan matches scheduled date. Marland Kitchen Appropriate non-routine labs are ordered dependent on drug ordered. . If applicable, additional medications reviewed and ordered per protocol based on lifetime cumulative doses and/or treatment regimen.   Plan for follow-up and/or issues identified: Yes . I-vent associated with next due treatment: Yes . MD and/or nursing notified: Yes  Philomena Course 06/10/2020 10:44 AM

## 2020-06-11 ENCOUNTER — Non-Acute Institutional Stay (SKILLED_NURSING_FACILITY): Payer: Medicare Other | Admitting: Adult Health

## 2020-06-11 ENCOUNTER — Encounter: Payer: Self-pay | Admitting: Adult Health

## 2020-06-11 DIAGNOSIS — R531 Weakness: Secondary | ICD-10-CM | POA: Diagnosis not present

## 2020-06-11 DIAGNOSIS — C3491 Malignant neoplasm of unspecified part of right bronchus or lung: Secondary | ICD-10-CM

## 2020-06-11 DIAGNOSIS — E871 Hypo-osmolality and hyponatremia: Secondary | ICD-10-CM | POA: Diagnosis not present

## 2020-06-11 DIAGNOSIS — J9601 Acute respiratory failure with hypoxia: Secondary | ICD-10-CM | POA: Diagnosis not present

## 2020-06-11 DIAGNOSIS — R000 Tachycardia, unspecified: Secondary | ICD-10-CM

## 2020-06-11 DIAGNOSIS — Z170 Estrogen receptor positive status [ER+]: Secondary | ICD-10-CM

## 2020-06-11 DIAGNOSIS — J90 Pleural effusion, not elsewhere classified: Secondary | ICD-10-CM | POA: Diagnosis not present

## 2020-06-11 DIAGNOSIS — C50211 Malignant neoplasm of upper-inner quadrant of right female breast: Secondary | ICD-10-CM

## 2020-06-11 LAB — COMPREHENSIVE METABOLIC PANEL: Calcium: 9.9 (ref 8.7–10.7)

## 2020-06-11 LAB — BASIC METABOLIC PANEL
BUN: 15 (ref 4–21)
CO2: 27 — AB (ref 13–22)
Chloride: 94 — AB (ref 99–108)
Creatinine: 0.5 (ref 0.5–1.1)
Glucose: 110
Potassium: 5.1 (ref 3.4–5.3)
Sodium: 130 — AB (ref 137–147)

## 2020-06-11 NOTE — Progress Notes (Signed)
Location:  Occupational psychologist of Service:  SNF (31)  Provider:  Cindi Carbon, Noel [phone]   PCP: Haywood Pao, MD Patient Care Team: Tisovec, Fransico Him, MD as PCP - General (Internal Medicine) Martinique, Peter M, MD as PCP - Cardiology (Cardiology) Nicholas Lose, MD as Consulting Physician (Hematology and Oncology)  Extended Emergency Contact Information Primary Emergency Contact: Nyra Jabs States of [place] Phone: [phone] Relation: Daughter Secondary Emergency Contact: Miles Costain States of [place] Phone: [phone] Mobile Phone: [phone] Relation: Son  Code Status: DNR Goals of care:  Advanced Directive information Advanced Directives 06/03/2020  Does Patient Have a Medical Advance Directive? Yes  Type of Advance Directive Out of facility DNR (pink MOST or yellow form)  Does patient want to make changes to medical advance directive? No - Guardian declined  Copy of [place] in Chart? -  Would patient like information on creating a medical advance directive? No - Patient declined  Pre-existing out of facility DNR order (yellow form or pink MOST form) Pink MOST form placed in chart (order not valid for inpatient use)     Allergies  Allergen Reactions  . Other Anaphylaxis    Lobster- "years ago"  . Codeine Itching    Insomnia   . Hydrocodone Itching and Other (See Comments)    Insomnia  . Neosporin [Neomycin-Bacitracin Zn-Polymyx] Rash    Chief Complaint  Patient presents with  . Discharge Note    HPI:  86 y.o. female seen for discharge from [facility] to [facility] after a hospitalization 05/22/20-05/29/20 due to low sodium and acute hypoxic resp failure and bilateral pleural effusions. On admission her NA was 120 and treated with IV lasix, IVF, and tolvaptan for possible SIADH due to hx of lung ca. NA was discharge was 133 on 6/11.She was diagnosed  with pna and treated with azithromycin and rocephin. She required oxygen. She underwent bilateral thoracentesis on both sides and it was found to be transudative. She was discharged on 2 liters. Now she is 95% on room air and even with ambulation she remains within normal limits (above 90) except for 1 instance of 89% which came up with pursed lip breathing. She does not feel sob, does not have any dizziness or cp, and feels ready to go home. She has received PT and OT and is alert and oriented. She is still undergoing chemotherapy for breast cancer (ER/PR positive s/p right lumpectomy in 2019 and will have her next treatment on 6/29.  She has inappropriate tachycardia chronically with soft bp. HR 78 06/11/2020 but can range 102-104 with no symptoms and soft bp. She has seen Dr. Martinique in the past.   She has a hx of non small cell lung can and s/p radiation. She has chronic left sided fibrothorax with pleural thickening and decreased lung sounds.      Past Medical History:  Diagnosis Date  . Allergic rhinitis   . Anemia    during pregnancy  . Arthritis   . Breast cancer ([facility]) 05/08/13   right upper inner, invasive mammary  . Breast cancer, right breast ([facility]) 04/16/2013   Underwent lumpectomy on 11/06/13. Path showed G1 ILC, 2.7 cm, neg margins, receptor+, Her2neg   . Diverticulosis   . Dyspnea    when carry heavy packages  . Dysrhythmia    Sinus Tach- on Metoprolol  . 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 Hemorrhoids   .  Hx of adenomatous colonic polyps 05/2002  . Lung cancer ([place])   . Lung cancer ([facility]) 10/2017  . Meningioma ([place])   . Osteoporosis   . Pneumonia     x2 last time was 2019  . Rosacea   . Skin cancer   . Tachycardia   . 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 Use of letrozole ([place])    neoadjuvant antiestrogen therapy with letrozole 2.5 mg daily x 7 monhts    Past Surgical History:  Procedure Laterality Date  . BREAST BIOPSY Left 1960   lt br bx/benign  . BREAST  LUMPECTOMY Right   . BREAST LUMPECTOMY Right 02/10/2020   Procedure: RIGHT BREAST LUMPECTOMY;  Surgeon: Coralie Keens, MD;  Location: [facility];  Service: General;  Laterality: Right;  . BREAST LUMPECTOMY WITH NEEDLE LOCALIZATION Bilateral 11/06/2013   Procedure: BREAST LUMPECTOMY WITH NEEDLE LOCALIZATION;  Surgeon: Haywood Lasso, MD;  Location: [facility];  Service: General;  Laterality: Bilateral;  . COLONOSCOPY    . EYE SURGERY Bilateral    both cataracts  . MOHS SURGERY Right    nose basal/squamous  . TOTAL HIP ARTHROPLASTY Left 08/31/2018   Procedure: LEFT TOTAL HIP ARTHROPLASTY ANTERIOR APPROACH;  Surgeon: Mcarthur Rossetti, MD;  Location: [facility];  Service: Orthopedics;  Laterality: Left;  Marland Kitchen VIDEO BRONCHOSCOPY WITH ENDOBRONCHIAL NAVIGATION N/A 11/01/2017   Procedure: VIDEO BRONCHOSCOPY WITH ENDOBRONCHIAL NAVIGATION;  Surgeon: [name], MD;  Location: [facility];  Service: Thoracic;  Laterality: N/A;  . WRIST SURGERY  1990   lt      reports that she quit smoking about 41 years ago. Her smoking use included cigarettes. She has a 12.50 pack-year smoking history. She has never used smokeless tobacco. She reports current alcohol use. She reports that she does not use drugs. Social History   Socioeconomic History  . Marital status: Divorced    Spouse name: Not on file  . Number of children: 2  . Years of education: Not on file  . Highest education level: Not on file  Occupational History  . Occupation: Retired  Tobacco Use  . Smoking status: Former Smoker    Packs/day: 0.50    Years: 25.00    Pack years: 12.50    Types: Cigarettes    Quit date: 12/19/1978    Years since quitting: 41.5  . Smokeless tobacco: Never Used  Vaping Use  . Vaping Use: Never used  Substance and Sexual Activity  . Alcohol use: Yes    Comment: "wine usually, no taste in the last in past few weeks- (02/07/2020)  . Drug use: No  . Sexual activity: Not Currently    Comment:  menarche at age14, menopause in 89, HRT x 59, age at first live birth 30's, G45 P3  Other Topics Concern  . Not on file  Social History Narrative  . Not on file   Social Determinants of Health   Financial Resource Strain:   . Difficulty of Paying Living Expenses:   Food Insecurity:   . Worried About Charity fundraiser in the Last Year:   . Arboriculturist in the Last Year:   Transportation Needs:   . Film/video editor (Medical):   Marland Kitchen Lack of Transportation (Non-Medical):   Physical Activity:   . Days of Exercise per Week:   . Minutes of Exercise per Session:   Stress:   . Feeling of Stress :   Social Connections:   . Frequency  of Communication with Friends and Family:   . Frequency of Social Gatherings with Friends and Family:   . Attends Religious Services:   . Active Member of Clubs or Organizations:   . Attends Archivist Meetings:   Marland Kitchen Marital Status:   Intimate Partner Violence:   . Fear of Current or Ex-Partner:   . Emotionally Abused:   Marland Kitchen Physically Abused:   . Sexually Abused:    Functional Status Survey:    Allergies  Allergen Reactions  . Other Anaphylaxis    Lobster- "years ago"  . Codeine Itching    Insomnia   . Hydrocodone Itching and Other (See Comments)    Insomnia  . Neosporin [Neomycin-Bacitracin Zn-Polymyx] Rash    Pertinent  Health Maintenance Due  Topic Date Due  . DEXA SCAN  Never done  . COLONOSCOPY  12/07/2014  . INFLUENZA VACCINE  07/19/2020  . PNA vac Low Risk Adult  Completed    Medications: Outpatient Encounter Medications as of 06/11/2020  Medication Sig  . acetaminophen (TYLENOL) 500 MG tablet Take 500 mg by mouth every 6 (six) hours as needed for moderate pain or headache.  . anastrozole (ARIMIDEX) 1 MG tablet Take 0.5 tablets (0.5 mg total) by mouth daily.  . Artificial Tear Solution (BION TEARS OP) Place 1-2 drops into both eyes as needed (dry eyes).   Marland Kitchen aspirin 81 MG tablet Take 81 mg by mouth daily.  .  cetirizine (ZYRTEC) 10 MG tablet Take 10 mg daily by mouth.   . Cholecalciferol (VITAMIN D-3) 1000 UNITS CAPS Take 1,000 Units daily by mouth.   . doxylamine, Sleep, (UNISOM) 25 MG tablet Take 25 mg at bedtime by mouth.  Marland Kitchen Glucosamine-Chondroit-MSM-C-Mn CAPS Take 1 capsule by mouth daily.  Marland Kitchen guaiFENesin-dextromethorphan (ROBITUSSIN DM) 100-10 MG/5ML syrup Take 5 mLs by mouth every 4 (four) hours as needed for cough (chest congestion).  . Menthol, Topical Analgesic, (BIOFREEZE EX) Apply 1 application topically daily as needed (leg pain).  . metoprolol tartrate (LOPRESSOR) 25 MG tablet TAKE ONE TABLET TWICE DAILY  . naphazoline-pheniramine (NAPHCON-A) 0.025-0.3 % ophthalmic solution Place 1-2 drops into both eyes as needed for eye irritation or allergies.   . nitroGLYCERIN (NITROSTAT) 0.4 MG SL tablet Place 0.4 mg under the tongue every 5 (five) minutes as needed for chest pain.  Vladimir Faster Glycol-Propyl Glycol (SYSTANE OP) Place 1 drop into both eyes daily as needed (dry eyes).  . pravastatin (PRAVACHOL) 40 MG tablet Take 40 mg daily by mouth.   . Probiotic Product (ALIGN) 4 MG CAPS Take 4 mg daily by mouth.  . SUPER B COMPLEX & C TABS Take 1 tablet by mouth daily.  . traMADol (ULTRAM) 50 MG tablet Take 1 tablet (50 mg total) by mouth every 6 (six) hours as needed for moderate pain or severe pain.  Marland Kitchen zolpidem (AMBIEN) 5 MG tablet Take 2.5 mg by mouth at bedtime as needed for sleep.    No facility-administered encounter medications on file as of 06/11/2020.    Review of Systems  Constitutional: Negative for activity change, appetite change, chills, diaphoresis, fatigue, fever and unexpected weight change.  HENT: Negative for congestion.   Respiratory: Negative for cough, shortness of breath and wheezing.   Cardiovascular: Negative for chest pain, palpitations and leg swelling.  Gastrointestinal: Negative for abdominal distention, abdominal pain, constipation and diarrhea.  Genitourinary:  Negative for difficulty urinating and dysuria.  Musculoskeletal: Positive for gait problem. Negative for arthralgias, back pain, joint swelling and myalgias.  Neurological:  Negative for dizziness, tremors, seizures, syncope, facial asymmetry, speech difficulty, weakness, light-headedness, numbness and headaches.  Psychiatric/Behavioral: Negative for agitation, behavioral problems and confusion.    Vitals:   06/11/20 1523  BP: 122/66  Pulse: 78  Resp: 18  Temp: 97.7 F (36.5 C)  SpO2: 95%   There is no height or weight on file to calculate BMI. Physical Exam Vitals and nursing note reviewed.  Constitutional:      General: She is not in acute distress.    Appearance: She is not diaphoretic.  HENT:     Head: Normocephalic and atraumatic.  Neck:     Vascular: No JVD.  Cardiovascular:     Rate and Rhythm: Normal rate and regular rhythm.     Heart sounds: No murmur heard.   Pulmonary:     Effort: Pulmonary effort is normal. No respiratory distress.     Breath sounds: No wheezing.     Comments: Decreased left base Abdominal:     General: Bowel sounds are normal.     Palpations: Abdomen is soft.  Musculoskeletal:     Right lower leg: No edema.     Left lower leg: No edema.  Skin:    General: Skin is warm and dry.  Neurological:     Mental Status: She is alert and oriented to person, place, and time.  Psychiatric:        Mood and Affect: Mood normal.     Labs reviewed: Basic Metabolic Panel: Recent Labs    05/27/20 0103 05/28/20 0541 05/29/20 0529  NA 127* 132* 133*  K 3.8 4.7 4.6  CL 90* 93* 92*  CO2 29 30 32  GLUCOSE 132* 106* 108*  BUN 15 9 16   CREATININE 0.59 0.55 0.62  CALCIUM 8.6* 9.0 9.2   Liver Function Tests: Recent Labs    04/20/20 1032 05/23/20 0500 05/23/20 0911  AST 20 33 37  ALT 31 41 46*  ALKPHOS 85 73 79  BILITOT 0.8 0.8 0.9  PROT 7.2 6.0* 6.6  ALBUMIN 3.8 3.4* 3.8   No results for input(s): LIPASE, AMYLASE in the last 8760  hours. No results for input(s): AMMONIA in the last 8760 hours. CBC: Recent Labs    04/20/20 1032 05/22/20 1706 05/23/20 0500  WBC 5.9 6.8 5.2  NEUTROABS 4.0 5.8  --   HGB 13.8 12.6 11.1*  HCT 43.6 37.8 33.2*  MCV 88.4 84.2 85.6  PLT 210 177 153   Cardiac Enzymes: No results for input(s): CKTOTAL, CKMB, CKMBINDEX, TROPONINI in the last 8760 hours. BNP: Invalid input(s): POCBNP CBG: No results for input(s): GLUCAP in the last 8760 hours.  Procedures and Imaging Studies During Stay: DG Chest 1 View  Result Date: 05/27/2020 CLINICAL DATA:  Post thoracentesis EXAM: CHEST  1 VIEW COMPARISON:  05/24/2020 FINDINGS: Upper normal heart size. Mediastinal contours and pulmonary vascularity normal. Bronchitic changes with subsegmental atelectasis at minor fissure. Moderate LEFT pleural effusion and LEFT basilar atelectasis unchanged. No new infiltrate or pneumothorax. Bones demineralized. IMPRESSION: Persistent moderate LEFT pleural effusion and basilar atelectasis. Bronchitic changes with subsegmental atelectasis at minor fissure. No pneumothorax following thoracentesis. Electronically Signed   By: Lavonia Dana M.D.   On: 05/27/2020 15:25   CT ANGIO CHEST PE W OR WO CONTRAST  Result Date: 05/26/2020 CLINICAL DATA:  86 year old with respiratory failure. Lung cancer status post radiation. New oxygen requirement. Shortness of breath. Pleural effusion versus pulmonary embolus. EXAM: CT ANGIOGRAPHY CHEST WITH CONTRAST TECHNIQUE: Multidetector CT imaging of the chest  was performed using the standard protocol during bolus administration of intravenous contrast. Multiplanar CT image reconstructions and MIPs were obtained to evaluate the vascular anatomy. CONTRAST:  128mL OMNIPAQUE IOHEXOL 350 MG/ML SOLN COMPARISON:  Chest radiograph 05/24/2020. Most recent chest CT 04/20/2020 FINDINGS: Cardiovascular: No discrete filling defects in the pulmonary arteries to suggest pulmonary embolus. The left lower lobe  pulmonary arteries are atretic but no filling defects are seen. Aortic atherosclerosis without aortic aneurysm. No aortic dissection. Upper normal heart size. Small pericardial effusion is unchanged. Mediastinum/Nodes: AP window lymph node measures 8 mm, series 4, image 41, previously 6 mm. Small lower paratracheal nodes, largest measuring 7 mm. No evidence of hilar adenopathy. No esophageal wall thickening. No thyroid nodule. Lungs/Pleura: Progressive narrowing with filling of the left lower lobe bronchus, increased bronchial filling from prior. Complete collapse of the left lower lobe again seen. Increased narrowing and short segment occlusion of the lingular bronchus with associated soft tissue density. Left upper lobe bronchus is patent. 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 Right pleural effusion, moderate in size, new from prior exam. No definite pleural thickening or enhancement. Adjacent compressive atelectasis. Chronic volume loss and nodular opacities abutting the right minor fissure, series 10 image 67, unchanged. Stable 5 mm pleural-based nodule in the right middle lobe, series 10, image 84. Mild mosaic pulmonary parenchymal attenuation is again seen. Upper Abdomen: No acute findings. Musculoskeletal: No acute osseous abnormality. No evidence of focal bone lesion. Review of the MIP images confirms the above findings. IMPRESSION: 1. No pulmonary embolus. 2. Right pleural effusion, moderate in size, new from prior exam. No definite pleural thickening or enhancement. 3. Progressive left lower lobe bronchial narrowing, with progressive bronchial filling. Increased narrowing of the lingular bronchus with short segment occlusion. 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 Complete left lower lobe collapse/consolidation, unchanged. 4. Small mediastinal lymph nodes remain subcentimeter but slightly increased from prior exam,  potentially reactive. Aortic Atherosclerosis (ICD10-I70.0). Electronically Signed   By: Keith Rake M.D.   On: 05/26/2020 18:23   DG Chest Port 1 View  Result Date: 05/28/2020 CLINICAL DATA:  Status post left thoracentesis EXAM: PORTABLE CHEST 1 VIEW COMPARISON:  05/27/2020 FINDINGS: Cardiac shadow is stable. Mild volume loss on the left is noted. Interval thoracentesis has been performed on the left. Decrease in left pleural effusion is noted. No pneumothorax is seen. Right lung remains clear. IMPRESSION: Decrease in left pleural effusion without evidence of pneumothorax following thoracentesis. Electronically Signed   By: Inez Catalina M.D.   On: 05/28/2020 12:14   DG CHEST PORT 1 VIEW  Result Date: 05/24/2020 CLINICAL DATA:  Increasing shortness of breath EXAM: PORTABLE CHEST 1 VIEW COMPARISON:  05/22/2020 FINDINGS: Single frontal view of the chest demonstrates persistent left pleural effusion and left lung consolidation. The right chest is clear. There is chronic central vascular congestion. No pneumothorax. No acute bony abnormalities. IMPRESSION: 1. Stable left pleural effusion and left lung consolidation. 2. Stable chronic central vascular congestion. Electronically Signed   By: Randa Ngo M.D.   On: 05/24/2020 17:59   DG Chest Portable 1 View  Result Date: 05/22/2020 CLINICAL DATA:  Worsening cough. EXAM: PORTABLE CHEST 1 VIEW COMPARISON:  February 06, 2018 FINDINGS: The lungs are hyperinflated. Mild, diffuse, chronic appearing increased lung markings are seen. Mild linear scarring and/or atelectasis is again seen along the lateral aspect of the mid right lung. There is marked severity opacification of the left lung base. A moderate  sized left-sided pleural effusion is noted. A mild-to-moderate amount of left apical pleural fluid is also seen. No pneumothorax is identified. The heart size and mediastinal contours are within normal limits. The visualized skeletal structures are unremarkable.  IMPRESSION: 1. Marked severity opacification of the left lung base, likely consistent with atelectasis and/or infiltrate. 2. Moderate sized left pleural effusion with mild to moderate amount of left apical pleural fluid. 3. Mild, diffuse, chronic appearing increased lung markings. Electronically Signed   By: Virgina Norfolk M.D.   On: 05/22/2020 17:53   ECHOCARDIOGRAM COMPLETE  Result Date: 05/29/2020    ECHOCARDIOGRAM REPORT   Patient Name:   KAYELYNN ABDOU Henigan Date of Exam: 05/29/2020 Medical Rec #:  [id number]        Height:       65.0 in Accession #:    [id number]       Weight:       131.2 lb Date of Birth:  Dec 28, 1925        BSA:          1.654 m Patient Age:    41 years         BP:           77/63 mmHg Patient Gender: F                HR:           124 bpm. Exam Location:  Inpatient Procedure: 2D Echo, Cardiac Doppler and Color Doppler Indications:    J09.32 Acute diastolic (congestive) heart failure  History:        Patient has prior history of Echocardiogram examinations, most                 recent 02/24/2020. Abnormal ECG; Risk Factors:Former Smoker.                 Tachycardia. History of cancer.  Sonographer:    [place] Referring Phys: [id number] AMRIT ADHIKARI  Sonographer Comments: Technically difficult study due to poor echo windows. IMPRESSIONS  1. Left ventricular ejection fraction, by estimation, is 60 to 65%. The left ventricle has normal function. The left ventricle has no regional wall motion abnormalities. Left ventricular diastolic parameters are consistent with Grade II diastolic dysfunction (pseudonormalization).  2. Right ventricular systolic function is normal. The right ventricular size is mildly enlarged. There is moderately elevated pulmonary artery systolic pressure. The estimated right ventricular systolic pressure is 09.9 mmHg.  3. Right atrial size was moderately dilated.  4. The mitral valve is normal in structure. Mild mitral valve regurgitation. No evidence of mitral  stenosis.  5. Tricuspid valve regurgitation is moderate.  6. The aortic valve is normal in structure. Aortic valve regurgitation is mild. No aortic stenosis is present.  7. The inferior vena cava is normal in size with greater than 50% respiratory variability, suggesting right atrial pressure of 3 mmHg. FINDINGS  Left Ventricle: Left ventricular ejection fraction, by estimation, is 60 to 65%. The left ventricle has normal function. The left ventricle has no regional wall motion abnormalities. The left ventricular internal cavity size was normal in size. There is  no left ventricular hypertrophy. Left ventricular diastolic parameters are consistent with Grade II diastolic dysfunction (pseudonormalization). Right Ventricle: The right ventricular size is mildly enlarged. No increase in right ventricular wall thickness. Right ventricular systolic function is normal. There is moderately elevated pulmonary artery systolic pressure. The tricuspid regurgitant velocity is 3.00 m/s, and with an assumed right atrial pressure of  15 mmHg, the estimated right ventricular systolic pressure is 62.2 mmHg. Left Atrium: Left atrial size was normal in size. Right Atrium: Right atrial size was moderately dilated. Pericardium: There is no evidence of pericardial effusion. Mitral Valve: The mitral valve is normal in structure. Normal mobility of the mitral valve leaflets. Mild mitral valve regurgitation. No evidence of mitral valve stenosis. Tricuspid Valve: The tricuspid valve is normal in structure. Tricuspid valve regurgitation is moderate . No evidence of tricuspid stenosis. Aortic Valve: The aortic valve is normal in structure. Aortic valve regurgitation is mild. Aortic regurgitation PHT measures 230 msec. No aortic stenosis is present. Pulmonic Valve: The pulmonic valve was normal in structure. Pulmonic valve regurgitation is not visualized. No evidence of pulmonic stenosis. Aorta: The aortic root is normal in size and structure.  Venous: The inferior vena cava is normal in size with greater than 50% respiratory variability, suggesting right atrial pressure of 3 mmHg. IAS/Shunts: No atrial level shunt detected by color flow Doppler.  LEFT VENTRICLE PLAX 2D LVIDd:         3.82 cm     Diastology LVIDs:         2.38 cm     LV e' lateral:   5.66 cm/s LV PW:         0.93 cm     LV E/e' lateral: 24.7 LV IVS:        1.00 cm     LV e' medial:    5.22 cm/s LVOT diam:     1.50 cm     LV E/e' medial:  26.8 LV SV:         36 LV SV Index:   22 LVOT Area:     1.77 cm  LV Volumes (MOD) LV vol d, MOD A2C: 40.0 ml LV vol d, MOD A4C: 33.1 ml LV vol s, MOD A2C: 21.5 ml LV vol s, MOD A4C: 10.8 ml LV SV MOD A2C:     18.5 ml LV SV MOD A4C:     33.1 ml LV SV MOD BP:      21.0 ml RIGHT VENTRICLE            IVC RV S prime:     9.46 cm/s  IVC diam: 1.81 cm TAPSE (M-mode): 1.9 cm LEFT ATRIUM             Index       RIGHT ATRIUM           Index LA diam:        3.40 cm 2.06 cm/m  RA Area:     16.20 cm LA Vol (A2C):   37.6 ml 22.74 ml/m RA Volume:   48.30 ml  29.21 ml/m LA Vol (A4C):   37.8 ml 22.86 ml/m LA Biplane Vol: 40.3 ml 24.37 ml/m  AORTIC VALVE LVOT Vmax:   103.00 cm/s LVOT Vmean:  78.100 cm/s LVOT VTI:    0.202 m AI PHT:      230 msec  AORTA Ao Root diam: 2.90 cm Ao Asc diam:  2.80 cm MITRAL VALVE                TRICUSPID VALVE MV Area (PHT): 4.60 cm     TR Peak grad:   36.0 mmHg MV Decel Time: 165 msec     TR Vmax:        300.00 cm/s MV E velocity: 140.00 cm/s MV A velocity: 106.00 cm/s  SHUNTS MV E/A ratio:  1.32  Systemic VTI:  0.20 m                             Systemic Diam: 1.50 cm Candee Furbish MD Electronically signed by Candee Furbish MD Signature Date/Time: 05/29/2020/1:27:44 PM    Final    US THORACENTESIS ASP PLEURAL SPACE W/IMG GUIDE  Result Date: 05/28/2020 INDICATION: Patient with history of lung cancer s/p radiation with dyspnea and bilateral pleural effusions s/p right thoracentesis in IR 05/27/2020 yielding 300 mL. Request is made  for therapeutic left thoracentesis. EXAM: ULTRASOUND GUIDED THERAPEUTIC LEFT THORACENTESIS MEDICATIONS: 10 mL 1% lidocaine COMPLICATIONS: None immediate. PROCEDURE: An ultrasound guided thoracentesis was thoroughly discussed with the patient and questions answered. The benefits, risks, alternatives and complications were also discussed. The patient understands and wishes to proceed with the procedure. Written consent was obtained. Ultrasound was performed to localize and mark an adequate pocket of fluid in the left chest. The area was then prepped and draped in the normal sterile fashion. 1% Lidocaine was used for local anesthesia. Under ultrasound guidance a 6 Fr Safe-T-Centesis catheter was introduced. Thoracentesis was performed. The catheter was removed and a dressing applied. FINDINGS: A total of approximately 300 mL of hazy amber fluid was removed. Procedure was stopped after 300 mL due to patient symptoms (development of chest pain). IMPRESSION: Successful ultrasound guided left thoracentesis yielding 300 mL of pleural fluid. Read by: Earley Abide, PA-C Electronically Signed   By: Aletta Edouard M.D.   On: 05/28/2020 12:21   US THORACENTESIS ASP PLEURAL SPACE W/IMG GUIDE  Result Date: 05/27/2020 INDICATION: Patient with history of lung cancer status post radiation. New symptoms of shortness of breath. Bilateral pleural effusions. Request for therapeutic and diagnostic right thoracentesis. EXAM: ULTRASOUND GUIDED RIGHT THORACENTESIS MEDICATIONS: 1% lidocaine 10 mL COMPLICATIONS: None immediate. PROCEDURE: An ultrasound guided thoracentesis was thoroughly discussed with the patient and questions answered. The benefits, risks, alternatives and complications were also discussed. The patient understands and wishes to proceed with the procedure. Written consent was obtained. Ultrasound was performed to localize and mark an adequate pocket of fluid in the right chest. The area was then prepped and draped in  the normal sterile fashion. 1% Lidocaine was used for local anesthesia. Under ultrasound guidance a 6 Fr Safe-T-Centesis catheter was introduced. Thoracentesis was performed. The catheter was removed and a dressing applied. FINDINGS: A total of approximately 300 mL of orange fluid was removed. Samples were sent to the laboratory as requested by the clinical team. IMPRESSION: Successful ultrasound guided right thoracentesis yielding 300 mL of pleural fluid. No pneumothorax on post-procedure chest x-ray. Patient also has left pleural effusion and could possibly benefit from left thoracentesis if clinically indicated. Read by: Gareth Eagle, PA-C Electronically Signed   By: Jacqulynn Cadet M.D.   On: 05/27/2020 15:28    Assessment/Plan:    1. Hyponatremia Improved, check BMP today May d/c in am if normal  2. Acute respiratory failure with hypoxia ([place]) Resolved with no findings indicative of the need for 02. Educated her on pursed lip breathing and s/s of acute resp failure  3. Pleural effusion, bilateral Improved after thoracentesis. Educated the resident on s/s of recurrent effusion and to call MD if there is an chest pain, doe, or sob.   4. Weakness Improved with therapy and ready for discharge   5. Malignant neoplasm of upper-inner quadrant of right breast in female, estrogen receptor positive ([place]) S/p lumpectomy and chemotherapy. Due for  next dose 6/29 Followed by oncology  6. Inappropriate sinus tachycardia HR 78 today, other readings 102-104 Her BP is soft at times so I did not make any changes as she is not symptomatic Recommend  F/U with Dr Martinique in 2 weeks   7. Non-small cell cancer of right lung [facility]) S/p radiation   Discharge review, assessment, plan, and coordination took >30 min  F/U with Dr. Valere Dross in 2-4 weeks  Patient is being discharged with the following home health services:  NA  Patient is being discharged with the following durable medical equipment:   NA  Patient has been advised to f/u with their PCP in 1-2 weeks to for a transitions of care visit.  Social services at their facility was responsible for arranging this appointment.  Pt was provided with adequate prescriptions of noncontrolled medications to reach the scheduled appointment .  For controlled substances, a limited supply was provided as appropriate for the individual patient.  If the pt normally receives these medications from a pain clinic or has a contract with another physician, these medications should be received from that clinic or physician only).    Future labs/tests needed:  BMP

## 2020-06-15 ENCOUNTER — Telehealth: Payer: Self-pay | Admitting: Cardiology

## 2020-06-15 ENCOUNTER — Other Ambulatory Visit: Payer: Self-pay | Admitting: *Deleted

## 2020-06-15 DIAGNOSIS — C50211 Malignant neoplasm of upper-inner quadrant of right female breast: Secondary | ICD-10-CM

## 2020-06-15 NOTE — Progress Notes (Signed)
Patient Care Team: Tisovec, Fransico Him, MD as PCP - General (Internal Medicine) Martinique, Peter M, MD as PCP - Cardiology (Cardiology) Nicholas Lose, MD as Consulting Physician (Hematology and Oncology)  DIAGNOSIS:    ICD-10-CM   1. Non-small cell cancer of right lung ([place])  C34.91     SUMMARY OF ONCOLOGIC HISTORY: Oncology History  Malignant neoplasm of upper-inner quadrant of right breast in female, estrogen receptor positive ([place])  05/08/2013 Initial Biopsy   Right: grade 1-2 invasive mammary carcinoma ER positive PR positive HER-2/neu negative with Ki-67 30% (MRI 2.8 cm)second smaller mass together 3.8cm   05/11/2013 - 11/04/2013 Anti-estrogen oral therapy   Letrozole 2.5 mg Neoadjuvant anti-estrogen therapy   11/06/2013 Surgery   Bilateral Lumpectomies: Left: sclerosing lesion with ALH fibrocystic changes with microcalcifications. Right: Grade 1 ILC 2.7 cm with LCIS    Radiation Therapy   Patient declined   11/19/2013 - 06/09/2015 Anti-estrogen oral therapy   Letrozole 2.5 mg (stopped for arthralgias and myalgias and fatigue accompanied with hair loss)   01/01/2020 Relapse/Recurrence   Screening mammogram detected a right breast mass. Diagnostic mammogram showed a 4.2cm mass at the 11 o'clock position in the right breast. Biopsy showed invasive mammary carcinoma, grade 2, HER-2 + (3+), ER+ 95%, PR+ 90%, Ki67 20%.    02/10/2020 Surgery   Right lumpectomy Ninfa Linden): mammary carcinoma, grade 2, 3.3cm, clear margins.   03/05/2020 -  Chemotherapy   The patient had trastuzumab-hyaluronidase-oysk (HERCEPTIN HYLECTA) 600-10000 MG-UNT/5ML chemo SQ injection 600 mg, 600 mg, Subcutaneous,  Once, 4 of 5 cycles Administration: 600 mg (03/05/2020), 600 mg (03/26/2020), 600 mg (04/16/2020), 600 mg (05/07/2020)  for chemotherapy treatment.    Non-small cell cancer of right lung ([place])  10/20/2017 PET scan   6 cm central left lower lobe pulmonary mass is markedly hypermetabolic with SUV max = 53.2  and extends into the left hilum.  The sub solid 2.4 cm pulmonary nodule in the right middle lobe also shows FDG accumulation with SUV max = 1.9. No evidence for hypermetabolic mediastinal or right hilar lymphadenopathy. L2 uptake degenerative.  LLL:T3N0M0 stage IIb clinical stage; RML: T2N0 Stage 1B   10/20/2017 Imaging   MRI brain: No metastatic disease, right inferior parietal convexity meningioma 2.9 x 2.7 x 1.4 cm.  This indents the brain and associated with mild brain edema   11/01/2017 Initial Diagnosis   Transbronchial needle aspiration right middle lobe and left lower lobe brushings: Both are positive for malignant cells consistent with non-small cell lung cancer    11/29/2017 - 01/18/2018 Radiation Therapy   Radiation   12/01/2017 Pathology Results   Foundation 1:TPS score: 5%; MS-Stable, TMG High, AKT2 Amp, RB1, TP 53 (no mutations noted in EGFR, K-ras, Al, BRAF, RET, ERBB2, Ros 1)     CHIEF COMPLIANT: Follow-up of recurrent right breast cancer on Herceptin maintenance, recent hospitalization  INTERVAL HISTORY: [name] French is a 86 y.o. with above-mentioned history of recurrent right breast cancer treated with lumpectomy and who is currently on Herceptin maintenance.She was admitted from 05/22/20-05/29/20 after labs at her PCP showed low sodium. CXR and CT showed bilateral pleural effusions and she underwent bilateral thoracentesis.  Cytology was negative for malignancy.  She presents to the clinic todayfor Herceptin treatment. Her biggest complaint is that of decreased energy levels.  Her sodium continues to remain low at 132 today.  When she was admitted to the hospital her sodium was 127.  Her breathing is better.  She no longer requires oxygen.  ALLERGIES:  is allergic to other, codeine, hydrocodone, and neosporin [neomycin-bacitracin zn-polymyx].  MEDICATIONS:  Current Outpatient Medications  Medication Sig Dispense Refill  . acetaminophen (TYLENOL) 500 MG tablet Take  500 mg by mouth every 6 (six) hours as needed for moderate pain or headache.    . anastrozole (ARIMIDEX) 1 MG tablet Take 0.5 tablets (0.5 mg total) by mouth daily. 90 tablet 3  . Artificial Tear Solution (BION TEARS OP) Place 1-2 drops into both eyes as needed (dry eyes).     Marland Kitchen aspirin 81 MG tablet Take 81 mg by mouth daily.    . cetirizine (ZYRTEC) 10 MG tablet Take 10 mg daily by mouth.     . Cholecalciferol (VITAMIN D-3) 1000 UNITS CAPS Take 1,000 Units daily by mouth.     . doxylamine, Sleep, (UNISOM) 25 MG tablet Take 25 mg at bedtime by mouth.    Marland Kitchen Glucosamine-Chondroit-MSM-C-Mn CAPS Take 1 capsule by mouth daily.    Marland Kitchen guaiFENesin-dextromethorphan (ROBITUSSIN DM) 100-10 MG/5ML syrup Take 5 mLs by mouth every 4 (four) hours as needed for cough (chest congestion). 118 mL 1  . Menthol, Topical Analgesic, (BIOFREEZE EX) Apply 1 application topically daily as needed (leg pain).    . metoprolol tartrate (LOPRESSOR) 25 MG tablet TAKE ONE TABLET TWICE DAILY 180 tablet 3  . naphazoline-pheniramine (NAPHCON-A) 0.025-0.3 % ophthalmic solution Place 1-2 drops into both eyes as needed for eye irritation or allergies.     . nitroGLYCERIN (NITROSTAT) 0.4 MG SL tablet Place 0.4 mg under the tongue every 5 (five) minutes as needed for chest pain.    Vladimir Faster Glycol-Propyl Glycol (SYSTANE OP) Place 1 drop into both eyes daily as needed (dry eyes).    . pravastatin (PRAVACHOL) 40 MG tablet Take 40 mg daily by mouth.     . Probiotic Product (ALIGN) 4 MG CAPS Take 4 mg daily by mouth.    . SUPER B COMPLEX & C TABS Take 1 tablet by mouth daily.  0  . traMADol (ULTRAM) 50 MG tablet Take 1 tablet (50 mg total) by mouth every 6 (six) hours as needed for moderate pain or severe pain. 20 tablet 0  . zolpidem (AMBIEN) 5 MG tablet Take 2.5 mg by mouth at bedtime as needed for sleep.   2   No current facility-administered medications for this visit.    PHYSICAL EXAMINATION: ECOG PERFORMANCE STATUS: 1 -  Symptomatic but completely ambulatory  Vitals:   06/16/20 1113  BP: 137/85  Pulse: (!) 110  Resp: 16  Temp: 98.7 F (37.1 C)  SpO2: 96%   Filed Weights   06/16/20 1113  Weight: 133 lb 3.2 oz (60.4 kg)    LABORATORY DATA:  I have reviewed the data as listed CMP Latest Ref Rng & Units 06/16/2020 05/29/2020 05/28/2020  Glucose 70 - 99 mg/dL 102(H) 108(H) 106(H)  BUN 8 - 23 mg/dL '8 16 9  '$ Creatinine 0.44 - 1.00 mg/dL 0.74 0.62 0.55  Sodium 135 - 145 mmol/L 132(L) 133(L) 132(L)  Potassium 3.5 - 5.1 mmol/L 4.4 4.6 4.7  Chloride 98 - 111 mmol/L 96(L) 92(L) 93(L)  CO2 22 - 32 mmol/L 27 32 30  Calcium 8.9 - 10.3 mg/dL 9.8 9.2 9.0  Total Protein 6.5 - 8.1 g/dL 7.0 - -  Total Bilirubin 0.3 - 1.2 mg/dL 0.5 - -  Alkaline Phos 38 - 126 U/L 71 - -  AST 15 - 41 U/L 18 - -  ALT 0 - 44 U/L 19 - -  Lab Results  Component Value Date   WBC 6.0 06/16/2020   HGB 12.1 06/16/2020   HCT 38.2 06/16/2020   MCV 85.1 06/16/2020   PLT 226 06/16/2020   NEUTROABS 4.3 06/16/2020    ASSESSMENT & PLAN:  Non-small cell cancer of right lung ([facility]) 12/23/2019: Suspicious 4.2 cm mass in the right breast, no abnormal right axillary lymph nodes 01/03/2020:Right breast biopsy 11 o'clock position: Grade 2 invasive lobular carcinoma with LCIS ER 95%, PR 90%, Ki-67 20%, HER-2 3+ positive  CT CAP 01/17/20:Masslike consolidation following radiation measuring 6.8 x 3.6 cm as compared to 7.2 by 4.2 cm. Pleural thickening approximately 6 mm. No metastatic disease  02/10/2020:Right lumpectomy Ninfa Linden): mammary carcinoma, grade 2, 3.3cm, clear margins.  Treatment plan: Adjuvant anastrozole with Herceptin subcutaneously for a year ------------------------------------------------------------------------------------------------------------------------------------------ Current treatment: Herceptin subcutaneous cycle8 Anastrozole toxicities: 1.Occasional loose stools: She took Imodium  She takes half a tablet  of anastrozole daily.  She does not report any side effects from anastrozole.  Fatigue: It could be from anastrozole or metoprolol.  so far she appears to be tolerating Herceptin and anastrozole very well.  Hyponatremia: Today sodium is 132.  Monitoring closely. Return to clinic every 3 weeks for Herceptin every 6 weeks of follow-up with me.      No orders of the defined types were placed in this encounter.  The patient has a good understanding of the overall plan. she agrees with it. she will call with any problems that may develop before the next visit here.  Total time spent: 30 mins including face to face time and time spent for planning, charting and coordination of care  Nicholas Lose, MD 06/16/2020  I, Cloyde Reams Dorshimer, am acting as scribe for Dr. Nicholas Lose.  I have reviewed the above documentation for accuracy and completeness, and I agree with the above.

## 2020-06-15 NOTE — Telephone Encounter (Signed)
New Message:    Pt said she was in the hospital for a week and Rehab for 2 weeks. She was told to call and let Dr Martinique know this and see if he wanted to see her or talk to her.

## 2020-06-16 ENCOUNTER — Inpatient Hospital Stay: Payer: Medicare Other

## 2020-06-16 ENCOUNTER — Other Ambulatory Visit: Payer: Self-pay

## 2020-06-16 ENCOUNTER — Inpatient Hospital Stay (HOSPITAL_BASED_OUTPATIENT_CLINIC_OR_DEPARTMENT_OTHER): Payer: Medicare Other | Admitting: Hematology and Oncology

## 2020-06-16 ENCOUNTER — Inpatient Hospital Stay: Payer: Medicare Other | Attending: Hematology and Oncology

## 2020-06-16 VITALS — HR 89

## 2020-06-16 DIAGNOSIS — Z5112 Encounter for antineoplastic immunotherapy: Secondary | ICD-10-CM | POA: Insufficient documentation

## 2020-06-16 DIAGNOSIS — C3491 Malignant neoplasm of unspecified part of right bronchus or lung: Secondary | ICD-10-CM | POA: Diagnosis not present

## 2020-06-16 DIAGNOSIS — Z170 Estrogen receptor positive status [ER+]: Secondary | ICD-10-CM

## 2020-06-16 DIAGNOSIS — E871 Hypo-osmolality and hyponatremia: Secondary | ICD-10-CM | POA: Diagnosis not present

## 2020-06-16 DIAGNOSIS — Z79811 Long term (current) use of aromatase inhibitors: Secondary | ICD-10-CM | POA: Insufficient documentation

## 2020-06-16 DIAGNOSIS — C50211 Malignant neoplasm of upper-inner quadrant of right female breast: Secondary | ICD-10-CM | POA: Insufficient documentation

## 2020-06-16 DIAGNOSIS — Z7982 Long term (current) use of aspirin: Secondary | ICD-10-CM | POA: Insufficient documentation

## 2020-06-16 DIAGNOSIS — Z79899 Other long term (current) drug therapy: Secondary | ICD-10-CM | POA: Diagnosis not present

## 2020-06-16 DIAGNOSIS — Z9221 Personal history of antineoplastic chemotherapy: Secondary | ICD-10-CM | POA: Diagnosis not present

## 2020-06-16 DIAGNOSIS — Z923 Personal history of irradiation: Secondary | ICD-10-CM | POA: Insufficient documentation

## 2020-06-16 LAB — CBC WITH DIFFERENTIAL (CANCER CENTER ONLY)
Abs Immature Granulocytes: 0.02 10*3/uL (ref 0.00–0.07)
Basophils Absolute: 0.1 10*3/uL (ref 0.0–0.1)
Basophils Relative: 1 %
Eosinophils Absolute: 0.2 10*3/uL (ref 0.0–0.5)
Eosinophils Relative: 3 %
HCT: 38.2 % (ref 36.0–46.0)
Hemoglobin: 12.1 g/dL (ref 12.0–15.0)
Immature Granulocytes: 0 %
Lymphocytes Relative: 13 %
Lymphs Abs: 0.8 10*3/uL (ref 0.7–4.0)
MCH: 26.9 pg (ref 26.0–34.0)
MCHC: 31.7 g/dL (ref 30.0–36.0)
MCV: 85.1 fL (ref 80.0–100.0)
Monocytes Absolute: 0.7 10*3/uL (ref 0.1–1.0)
Monocytes Relative: 12 %
Neutro Abs: 4.3 10*3/uL (ref 1.7–7.7)
Neutrophils Relative %: 71 %
Platelet Count: 226 10*3/uL (ref 150–400)
RBC: 4.49 MIL/uL (ref 3.87–5.11)
RDW: 15 % (ref 11.5–15.5)
WBC Count: 6 10*3/uL (ref 4.0–10.5)
nRBC: 0 % (ref 0.0–0.2)

## 2020-06-16 LAB — CMP (CANCER CENTER ONLY)
ALT: 19 U/L (ref 0–44)
AST: 18 U/L (ref 15–41)
Albumin: 3.7 g/dL (ref 3.5–5.0)
Alkaline Phosphatase: 71 U/L (ref 38–126)
Anion gap: 9 (ref 5–15)
BUN: 8 mg/dL (ref 8–23)
CO2: 27 mmol/L (ref 22–32)
Calcium: 9.8 mg/dL (ref 8.9–10.3)
Chloride: 96 mmol/L — ABNORMAL LOW (ref 98–111)
Creatinine: 0.74 mg/dL (ref 0.44–1.00)
GFR, Est AFR Am: >60 mL/min (ref >60)
GFR, Estimated: >60 mL/min (ref >60)
Glucose, Bld: 102 mg/dL — ABNORMAL HIGH (ref 70–99)
Potassium: 4.4 mmol/L (ref 3.5–5.1)
Sodium: 132 mmol/L — ABNORMAL LOW (ref 135–145)
Total Bilirubin: 0.5 mg/dL (ref 0.3–1.2)
Total Protein: 7 g/dL (ref 6.5–8.1)

## 2020-06-16 MED ORDER — DIPHENHYDRAMINE HCL 25 MG PO CAPS
25.0000 mg | ORAL_CAPSULE | Freq: Once | ORAL | Status: AC
  Administered 2020-06-16: 25 mg via ORAL

## 2020-06-16 MED ORDER — TRASTUZUMAB-HYALURONIDASE-OYSK 600-10000 MG-UNT/5ML ~~LOC~~ SOLN
600.0000 mg | Freq: Once | SUBCUTANEOUS | Status: AC
  Administered 2020-06-16: 600 mg via SUBCUTANEOUS
  Filled 2020-06-16: qty 5

## 2020-06-16 MED ORDER — DIPHENHYDRAMINE HCL 25 MG PO CAPS
ORAL_CAPSULE | ORAL | Status: AC
  Filled 2020-06-16: qty 1

## 2020-06-16 MED ORDER — ACETAMINOPHEN 325 MG PO TABS
650.0000 mg | ORAL_TABLET | Freq: Once | ORAL | Status: AC
  Administered 2020-06-16: 650 mg via ORAL

## 2020-06-16 MED ORDER — ACETAMINOPHEN 325 MG PO TABS
ORAL_TABLET | ORAL | Status: AC
  Filled 2020-06-16: qty 2

## 2020-06-16 NOTE — Assessment & Plan Note (Signed)
12/23/2019: Suspicious 4.2 cm mass in the right breast, no abnormal right axillary lymph nodes 01/03/2020:Right breast biopsy 11 o'clock position: Grade 2 invasive lobular carcinoma with LCIS ER 95%, PR 90%, Ki-67 20%, HER-2 3+ positive  CT CAP 01/17/20:Masslike consolidation following radiation measuring 6.8 x 3.6 cm as compared to 7.2 by 4.2 cm. Pleural thickening approximately 6 mm. No metastatic disease  02/10/2020:Right lumpectomy [name] French): mammary carcinoma, grade 2, 3.3cm, clear margins.  Treatment plan: Adjuvant anastrozole with Herceptin subcutaneously for a year ------------------------------------------------------------------------------------------------------------------------------------------ Current treatment: Herceptin subcutaneous cycle8 Anastrozole toxicities: 1.Occasional loose stools: She took Imodium  She takes half a tablet of anastrozole daily.  She does not report any side effects from anastrozole.  Fatigue: It could be from anastrozole or metoprolol.  so far she appears to be tolerating Herceptin and anastrozole very well. Return to clinic every 3 weeks for Herceptin every 6 weeks of follow-up with me.

## 2020-06-16 NOTE — Patient Instructions (Signed)
Trastuzumab; Hyaluronidase injection What is this medicine? TRASTUZUMAB; HYALURONIDASE (tras TOO zoo mab / hye al ur ON i dase) is used to treat breast cancer and stomach cancer. Trastuzumab is a monoclonal antibody. Hyaluronidase is used to improve the effects of trastuzumab. This medicine may be used for other purposes; ask your health care provider or pharmacist if you have questions. COMMON BRAND NAME(S): HERCEPTIN HYLECTA What should I tell my health care provider before I take this medicine? They need to know if you have any of these conditions:  heart disease  heart failure  lung or breathing disease, like asthma  an unusual or allergic reaction to trastuzumab, or other medications, foods, dyes, or preservatives  pregnant or trying to get pregnant  breast-feeding How should I use this medicine? This medicine is for injection under the skin. It is given by a health care professional in a hospital or clinic setting. Talk to your pediatrician regarding the use of this medicine in children. This medicine is not approved for use in children. Overdosage: If you think you have taken too much of this medicine contact a poison control center or emergency room at once. NOTE: This medicine is only for you. Do not share this medicine with others. What if I miss a dose? It is important not to miss a dose. Call your doctor or health care professional if you are unable to keep an appointment. What may interact with this medicine? This medicine may interact with the following medications:  certain types of chemotherapy, such as daunorubicin, doxorubicin, epirubicin, and idarubicin This list may not describe all possible interactions. Give your health care provider a list of all the medicines, herbs, non-prescription drugs, or dietary supplements you use. Also tell them if you smoke, drink alcohol, or use illegal drugs. Some items may interact with your medicine. What should I watch for while  using this medicine? Visit your doctor for checks on your progress. Report any side effects. Continue your course of treatment even though you feel ill unless your doctor tells you to stop. Call your doctor or health care professional for advice if you get a fever, chills or sore throat, or other symptoms of a cold or flu. Do not treat yourself. Try to avoid being around people who are sick. You may experience fever, chills and shaking during your first infusion. These effects are usually mild and can be treated with other medicines. Report any side effects during the infusion to your health care professional. Fever and chills usually do not happen with later infusions. Do not become pregnant while taking this medicine or for 7 months after stopping it. Women should inform their doctor if they wish to become pregnant or think they might be pregnant. Women of child-bearing potential will need to have a negative pregnancy test before starting this medicine. There is a potential for serious side effects to an unborn child. Talk to your health care professional or pharmacist for more information. Do not breast-feed an infant while taking this medicine or for 7 months after stopping it. What side effects may I notice from receiving this medicine? Side effects that you should report to your doctor or health care professional as soon as possible:  allergic reactions like skin rash, itching or hives, swelling of the face, lips, or tongue  breathing problems  chest pain or palpitations  cough  fever  general ill feeling or flu-like symptoms  signs of worsening heart failure like breathing problems; swelling in your legs and  feet Side effects that usually do not require medical attention (report these to your doctor or health care professional if they continue or are bothersome):  bone pain  changes in taste  diarrhea  joint pain  nausea/vomiting  unusually weak or tired  weight loss This  list may not describe all possible side effects. Call your doctor for medical advice about side effects. You may report side effects to FDA at 1-800-FDA-1088. Where should I keep my medicine? This drug is given in a hospital or clinic and will not be stored at home. NOTE: This sheet is a summary. It may not cover all possible information. If you have questions about this medicine, talk to your doctor, pharmacist, or health care provider.  2020 Elsevier/Gold Standard (2018-02-23 21:54:17)

## 2020-06-16 NOTE — Telephone Encounter (Signed)
Spoke to patient 6/28 stated she has been in rehab since discharged from [facility] hospital.Stated she is feeling better.She is home now.Post hospital appointment scheduled with Dr.Jordan 7/27 at 10:40 am.

## 2020-06-18 ENCOUNTER — Inpatient Hospital Stay: Payer: Medicare Other

## 2020-06-18 ENCOUNTER — Telehealth: Payer: Self-pay

## 2020-06-18 ENCOUNTER — Inpatient Hospital Stay: Payer: Medicare Other | Admitting: Adult Health

## 2020-06-18 NOTE — Assessment & Plan Note (Deleted)
12/23/2019: Suspicious 4.2 cm mass in the right breast, no abnormal right axillary lymph nodes 01/03/2020:Right breast biopsy 11 o'clock position: Grade 2 invasive lobular carcinoma with LCIS ER 95%, PR 90%, Ki-67 20%, HER-2 3+ positive  CT CAP 01/17/20:Masslike consolidation following radiation measuring 6.8 x 3.6 cm as compared to 7.2 by 4.2 cm. Pleural thickening approximately 6 mm. No metastatic disease  02/10/2020:Right lumpectomy ([place]): mammary carcinoma, grade 2, 3.3cm, clear margins.  Treatment plan: Adjuvant anastrozole with Herceptin subcutaneously for a year ------------------------------------------------------------------------------------------------------------------------------------------ Current treatment: Herceptin subcutaneous cycle8 Anastrozole toxicities: 1.Occasional loose stools: She took Imodium  She takes half a tablet of anastrozole daily.  She does not report any side effects from anastrozole.  Fatigue: It could be from anastrozole or metoprolol.  so far she appears to be tolerating Herceptin and anastrozole very well. Return to clinic every 3 weeks for Herceptin every 6 weeks of follow-up with me.   

## 2020-06-18 NOTE — Telephone Encounter (Signed)
Attempted to call pt regarding appt as she is a [place]/NS. Pt did not answer and VM box is full.

## 2020-06-18 NOTE — Progress Notes (Deleted)
[facility] Cancer Follow up:    Tisovec, [name] Koh, MD [address] [place] [address]   DIAGNOSIS: Cancer Staging Malignant neoplasm of upper-inner quadrant of right breast in female, estrogen receptor positive (HCC) Staging form: Breast, AJCC 7th Edition - Pathologic: Stage Unknown (T2, NX, cM0) - Signed by Lurline Hare, MD on 11/27/2013   SUMMARY OF ONCOLOGIC HISTORY: Oncology History  Malignant neoplasm of upper-inner quadrant of right breast in female, estrogen receptor positive (HCC)  05/08/2013 Initial Biopsy   Right: grade 1-2 invasive mammary carcinoma ER positive PR positive HER-2/neu negative with Ki-67 30% (MRI 2.8 cm)second smaller mass together 3.8cm   05/11/2013 - 11/04/2013 Anti-estrogen oral therapy   Letrozole 2.5 mg Neoadjuvant anti-estrogen therapy   11/06/2013 Surgery   Bilateral Lumpectomies: Left: sclerosing lesion with ALH fibrocystic changes with microcalcifications. Right: Grade 1 ILC 2.7 cm with LCIS    Radiation Therapy   Patient declined   11/19/2013 - 06/09/2015 Anti-estrogen oral therapy   Letrozole 2.5 mg (stopped for arthralgias and myalgias and fatigue accompanied with hair loss)   01/01/2020 Relapse/Recurrence   Screening mammogram detected a right breast mass. Diagnostic mammogram showed a 4.2cm mass at the 11 o'clock position in the right breast. Biopsy showed invasive mammary carcinoma, grade 2, HER-2 + (3+), ER+ 95%, PR+ 90%, Ki67 20%.    02/10/2020 Surgery   Right lumpectomy Magnus Ivan): mammary carcinoma, grade 2, 3.3cm, clear margins.   03/05/2020 -  Chemotherapy   The patient had trastuzumab-hyaluronidase-oysk (HERCEPTIN HYLECTA) 600-10000 MG-UNT/5ML chemo SQ injection 600 mg, 600 mg, Subcutaneous,  Once, 5 of 5 cycles Administration: 600 mg (03/05/2020), 600 mg (03/26/2020), 600 mg (06/16/2020), 600 mg (04/16/2020), 600 mg (05/07/2020)  for chemotherapy treatment.    Non-small cell cancer of right lung (HCC)  10/20/2017  PET scan   6 cm central left lower lobe pulmonary mass is markedly hypermetabolic with SUV max = 26.8 and extends into the left hilum.  The sub solid 2.4 cm pulmonary nodule in the right middle lobe also shows FDG accumulation with SUV max = 1.9. No evidence for hypermetabolic mediastinal or right hilar lymphadenopathy. L2 uptake degenerative.  LLL:T3N0M0 stage IIb clinical stage; RML: T2N0 Stage 1B   10/20/2017 Imaging   MRI brain: No metastatic disease, right inferior parietal convexity meningioma 2.9 x 2.7 x 1.4 cm.  This indents the brain and associated with mild brain edema   11/01/2017 Initial Diagnosis   Transbronchial needle aspiration right middle lobe and left lower lobe brushings: Both are positive for malignant cells consistent with non-small cell lung cancer    11/29/2017 - 01/18/2018 Radiation Therapy   Radiation   12/01/2017 Pathology Results   Foundation 1:TPS score: 5%; MS-Stable, TMG High, AKT2 Amp, RB1, TP 53 (no mutations noted in EGFR, K-ras, Al, BRAF, RET, ERBB2, Ros 1)     CURRENT THERAPY:  INTERVAL HISTORY: [name] French 86 y.o. female returns for    Patient Active Problem List   Diagnosis Date Noted   Hyponatremia 05/22/2020   Senile osteoporosis 09/14/2018   Sleep disturbances 09/14/2018   History of breast cancer 09/14/2018   ACP (advance care planning) 09/14/2018   Status post total replacement of left hip 08/31/2018   Unilateral primary osteoarthritis, left hip 08/06/2018   Pleural effusion on left 07/11/2018   Chest pain 12/25/2017   Abnormal findings on radiological examination of gastrointestinal tract 11/16/2017   Primary cancer of left lower lobe of lung (HCC) 11/15/2017   Non-small cell cancer of  right lung (HCC) 11/07/2017   Lung mass 10/17/2017   Malignant neoplasm of upper-inner quadrant of right breast in female, estrogen receptor positive (HCC) 06/10/2013   Mass of breast, left 05/31/2013   PERSONAL HISTORY OF COLONIC  POLYPS 07/26/2010    is allergic to other, codeine, hydrocodone, and neosporin [neomycin-bacitracin zn-polymyx].  MEDICAL HISTORY: Past Medical History:  Diagnosis Date   Allergic rhinitis    Anemia    during pregnancy   Arthritis    Breast cancer (HCC) 05/08/13   right upper inner, invasive mammary   Breast cancer, right breast (HCC) 04/16/2013   Underwent lumpectomy on 11/06/13. 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   Use of letrozole (Femara)    neoadjuvant antiestrogen therapy with letrozole 2.5 mg daily x 7 monhts    SURGICAL HISTORY: Past Surgical History:  Procedure Laterality Date   BREAST BIOPSY Left 1960   lt br bx/benign   BREAST LUMPECTOMY Right    BREAST LUMPECTOMY Right 02/10/2020   Procedure: RIGHT BREAST LUMPECTOMY;  Surgeon: Abigail Miyamoto, MD;  Location: [facility];  Service: General;  Laterality: Right;   BREAST LUMPECTOMY WITH NEEDLE LOCALIZATION Bilateral 11/06/2013   Procedure: BREAST LUMPECTOMY WITH NEEDLE LOCALIZATION;  Surgeon: Currie Paris, MD;  Location: [facility];  Service: General;  Laterality: Bilateral;   COLONOSCOPY     EYE SURGERY Bilateral    both cataracts   MOHS SURGERY Right    nose basal/squamous   TOTAL HIP ARTHROPLASTY Left 08/31/2018   Procedure: LEFT TOTAL HIP ARTHROPLASTY ANTERIOR APPROACH;  Surgeon: Kathryne Hitch, MD;  Location: [facility];  Service: Orthopedics;  Laterality: Left;   VIDEO BRONCHOSCOPY WITH ENDOBRONCHIAL NAVIGATION N/A 11/01/2017    Procedure: VIDEO BRONCHOSCOPY WITH ENDOBRONCHIAL NAVIGATION;  Surgeon: Loreli Slot, MD;  Location: [facility];  Service: Thoracic;  Laterality: N/A;   WRIST SURGERY  1990   lt    SOCIAL HISTORY: Social History   Socioeconomic History   Marital status: Divorced    Spouse name: Not on file   Number of children: 2   Years of education: Not on file   Highest education level: Not on file  Occupational History   Occupation: Retired  Tobacco Use   Smoking status: Former Smoker    Packs/day: 0.50    Years: 25.00    Pack years: 12.50    Types: Cigarettes    Quit date: 12/19/1978    Years since quitting: 41.5   Smokeless tobacco: Never Used  Vaping Use   Vaping Use: Never used  Substance and Sexual Activity   Alcohol use: Yes    Comment: "wine usually, no taste in the last in past few weeks- (02/07/2020)   Drug use: No   Sexual activity: Not Currently    Comment: menarche at age14, menopause in 30, HRT x 69, age at first live birth 54's, G45 P3  Other  Topics Concern   Not on file  Social History Narrative   Not on file   Social Determinants of Health   Financial Resource Strain:    Difficulty of Paying Living Expenses:   Food Insecurity:    Worried About Charity fundraiser in the Last Year:    Arboriculturist in the Last Year:   Transportation Needs:    Film/video editor (Medical):    Lack of Transportation (Non-Medical):   Physical Activity:    Days of Exercise per Week:    Minutes of Exercise per Session:   Stress:    Feeling of Stress :   Social Connections:    Frequency of Communication with Friends and Family:    Frequency of Social Gatherings with Friends and Family:    Attends Religious Services:    Active Member of Clubs or Organizations:    Attends Music therapist:    Marital Status:   Intimate Partner Violence:    Fear of Current or Ex-Partner:    Emotionally Abused:    Physically Abused:    Sexually  Abused:     FAMILY HISTORY: Family History  Problem Relation Age of Onset   Heart disease Mother    Prostate cancer Father    Lung cancer Sister     Review of Systems - Oncology    PHYSICAL EXAMINATION  ECOG PERFORMANCE STATUS: {CHL ONC ECOG VV:[id number]}  There were no vitals filed for this visit.  Physical Exam  LABORATORY DATA:  CBC    Component Value Date/Time   WBC 6.0 06/16/2020 1050   WBC 5.2 05/23/2020 0500   RBC 4.49 06/16/2020 1050   HGB 12.1 06/16/2020 1050   HGB 14.4 04/13/2015 0946   HCT 38.2 06/16/2020 1050   HCT 44.2 04/13/2015 0946   PLT 226 06/16/2020 1050   PLT 227 04/13/2015 0946   MCV 85.1 06/16/2020 1050   MCV 90.2 04/13/2015 0946   MCH 26.9 06/16/2020 1050   MCHC 31.7 06/16/2020 1050   RDW 15.0 06/16/2020 1050   RDW 13.7 04/13/2015 0946   LYMPHSABS 0.8 06/16/2020 1050   LYMPHSABS 1.8 04/13/2015 0946   MONOABS 0.7 06/16/2020 1050   MONOABS 0.4 04/13/2015 0946   EOSABS 0.2 06/16/2020 1050   EOSABS 0.2 04/13/2015 0946   BASOSABS 0.1 06/16/2020 1050   BASOSABS 0.1 04/13/2015 0946    CMP     Component Value Date/Time   NA 132 (L) 06/16/2020 1050   NA 141 04/13/2015 0946   K 4.4 06/16/2020 1050   K 4.3 04/13/2015 0946   CL 96 (L) 06/16/2020 1050   CL 104 06/10/2013 1246   CO2 27 06/16/2020 1050   CO2 25 04/13/2015 0946   GLUCOSE 102 (H) 06/16/2020 1050   GLUCOSE 97 04/13/2015 0946   GLUCOSE 95 06/10/2013 1246   BUN 8 06/16/2020 1050   BUN 12.9 04/13/2015 0946   CREATININE 0.74 06/16/2020 1050   CREATININE 0.8 04/13/2015 0946   CALCIUM 9.8 06/16/2020 1050   CALCIUM 9.5 04/13/2015 0946   PROT 7.0 06/16/2020 1050   PROT 6.9 04/13/2015 0946   ALBUMIN 3.7 06/16/2020 1050   ALBUMIN 3.8 04/13/2015 0946   AST 18 06/16/2020 1050   AST 11 04/13/2015 0946   ALT 19 06/16/2020 1050   ALT 10 04/13/2015 0946   ALKPHOS 71 06/16/2020 1050   ALKPHOS 54 04/13/2015 0946   BILITOT 0.5 06/16/2020 1050   BILITOT 0.53 04/13/2015 0946    GFRNONAA >60  06/16/2020 1050   GFRAA >60 06/16/2020 1050       PENDING LABS:   RADIOGRAPHIC STUDIES:  No results found.   PATHOLOGY:     ASSESSMENT and THERAPY PLAN:   No problem-specific Assessment & Plan notes found for this encounter.   No orders of the defined types were placed in this encounter.   All questions were answered. The patient knows to call the clinic with any problems, questions or concerns. We can certainly see the patient much sooner if necessary. This note was electronically signed. Scot Dock, NP 06/18/2020

## 2020-06-18 NOTE — Telephone Encounter (Signed)
Spoke with pt who states appt for today was supposed to be cancelled at 06/16/20 visit. Pt states there was a "mix up" with appts and she was not supposed to be seen today for visit or infusion. Appts will be cancelled.

## 2020-06-19 DIAGNOSIS — R2689 Other abnormalities of gait and mobility: Secondary | ICD-10-CM | POA: Diagnosis not present

## 2020-06-19 DIAGNOSIS — M6389 Disorders of muscle in diseases classified elsewhere, multiple sites: Secondary | ICD-10-CM | POA: Diagnosis not present

## 2020-06-19 DIAGNOSIS — E871 Hypo-osmolality and hyponatremia: Secondary | ICD-10-CM | POA: Diagnosis not present

## 2020-06-19 DIAGNOSIS — J9601 Acute respiratory failure with hypoxia: Secondary | ICD-10-CM | POA: Diagnosis not present

## 2020-06-19 DIAGNOSIS — J90 Pleural effusion, not elsewhere classified: Secondary | ICD-10-CM | POA: Diagnosis not present

## 2020-06-19 DIAGNOSIS — R278 Other lack of coordination: Secondary | ICD-10-CM | POA: Diagnosis not present

## 2020-06-24 ENCOUNTER — Encounter: Payer: Self-pay | Admitting: Internal Medicine

## 2020-06-26 DIAGNOSIS — R278 Other lack of coordination: Secondary | ICD-10-CM | POA: Diagnosis not present

## 2020-06-26 DIAGNOSIS — E871 Hypo-osmolality and hyponatremia: Secondary | ICD-10-CM | POA: Diagnosis not present

## 2020-06-26 DIAGNOSIS — M6389 Disorders of muscle in diseases classified elsewhere, multiple sites: Secondary | ICD-10-CM | POA: Diagnosis not present

## 2020-06-26 DIAGNOSIS — R2689 Other abnormalities of gait and mobility: Secondary | ICD-10-CM | POA: Diagnosis not present

## 2020-06-26 DIAGNOSIS — J9601 Acute respiratory failure with hypoxia: Secondary | ICD-10-CM | POA: Diagnosis not present

## 2020-06-26 DIAGNOSIS — J90 Pleural effusion, not elsewhere classified: Secondary | ICD-10-CM | POA: Diagnosis not present

## 2020-07-09 ENCOUNTER — Other Ambulatory Visit: Payer: Self-pay

## 2020-07-09 ENCOUNTER — Other Ambulatory Visit: Payer: Self-pay | Admitting: Hematology and Oncology

## 2020-07-09 ENCOUNTER — Inpatient Hospital Stay: Payer: Medicare Other | Attending: Hematology and Oncology

## 2020-07-09 VITALS — BP 127/73 | HR 110 | Temp 97.7°F | Resp 18

## 2020-07-09 DIAGNOSIS — Z5112 Encounter for antineoplastic immunotherapy: Secondary | ICD-10-CM | POA: Insufficient documentation

## 2020-07-09 DIAGNOSIS — Z170 Estrogen receptor positive status [ER+]: Secondary | ICD-10-CM | POA: Insufficient documentation

## 2020-07-09 DIAGNOSIS — C50211 Malignant neoplasm of upper-inner quadrant of right female breast: Secondary | ICD-10-CM | POA: Insufficient documentation

## 2020-07-09 MED ORDER — ACETAMINOPHEN 325 MG PO TABS
650.0000 mg | ORAL_TABLET | Freq: Once | ORAL | Status: AC
  Administered 2020-07-09: 650 mg via ORAL

## 2020-07-09 MED ORDER — DIPHENHYDRAMINE HCL 25 MG PO CAPS
25.0000 mg | ORAL_CAPSULE | Freq: Once | ORAL | Status: AC
  Administered 2020-07-09: 25 mg via ORAL

## 2020-07-09 MED ORDER — TRASTUZUMAB-HYALURONIDASE-OYSK 600-10000 MG-UNT/5ML ~~LOC~~ SOLN
600.0000 mg | Freq: Once | SUBCUTANEOUS | Status: AC
  Administered 2020-07-09: 600 mg via SUBCUTANEOUS
  Filled 2020-07-09: qty 5

## 2020-07-09 NOTE — Progress Notes (Signed)
Per MD okay to treat with HR 110

## 2020-07-09 NOTE — Patient Instructions (Signed)
Trastuzumab; Hyaluronidase injection What is this medicine? TRASTUZUMAB; HYALURONIDASE (tras TOO zoo mab / hye al ur ON i dase) is used to treat breast cancer and stomach cancer. Trastuzumab is a monoclonal antibody. Hyaluronidase is used to improve the effects of trastuzumab. This medicine may be used for other purposes; ask your health care provider or pharmacist if you have questions. COMMON BRAND NAME(S): HERCEPTIN HYLECTA What should I tell my health care provider before I take this medicine? They need to know if you have any of these conditions:  heart disease  heart failure  lung or breathing disease, like asthma  an unusual or allergic reaction to trastuzumab, or other medications, foods, dyes, or preservatives  pregnant or trying to get pregnant  breast-feeding How should I use this medicine? This medicine is for injection under the skin. It is given by a health care professional in a hospital or clinic setting. Talk to your pediatrician regarding the use of this medicine in children. This medicine is not approved for use in children. Overdosage: If you think you have taken too much of this medicine contact a poison control center or emergency room at once. NOTE: This medicine is only for you. Do not share this medicine with others. What if I miss a dose? It is important not to miss a dose. Call your doctor or health care professional if you are unable to keep an appointment. What may interact with this medicine? This medicine may interact with the following medications:  certain types of chemotherapy, such as daunorubicin, doxorubicin, epirubicin, and idarubicin This list may not describe all possible interactions. Give your health care provider a list of all the medicines, herbs, non-prescription drugs, or dietary supplements you use. Also tell them if you smoke, drink alcohol, or use illegal drugs. Some items may interact with your medicine. What should I watch for while  using this medicine? Visit your doctor for checks on your progress. Report any side effects. Continue your course of treatment even though you feel ill unless your doctor tells you to stop. Call your doctor or health care professional for advice if you get a fever, chills or sore throat, or other symptoms of a cold or flu. Do not treat yourself. Try to avoid being around people who are sick. You may experience fever, chills and shaking during your first infusion. These effects are usually mild and can be treated with other medicines. Report any side effects during the infusion to your health care professional. Fever and chills usually do not happen with later infusions. Do not become pregnant while taking this medicine or for 7 months after stopping it. Women should inform their doctor if they wish to become pregnant or think they might be pregnant. Women of child-bearing potential will need to have a negative pregnancy test before starting this medicine. There is a potential for serious side effects to an unborn child. Talk to your health care professional or pharmacist for more information. Do not breast-feed an infant while taking this medicine or for 7 months after stopping it. What side effects may I notice from receiving this medicine? Side effects that you should report to your doctor or health care professional as soon as possible:  allergic reactions like skin rash, itching or hives, swelling of the face, lips, or tongue  breathing problems  chest pain or palpitations  cough  fever  general ill feeling or flu-like symptoms  signs of worsening heart failure like breathing problems; swelling in your legs and  feet Side effects that usually do not require medical attention (report these to your doctor or health care professional if they continue or are bothersome):  bone pain  changes in taste  diarrhea  joint pain  nausea/vomiting  unusually weak or tired  weight loss This  list may not describe all possible side effects. Call your doctor for medical advice about side effects. You may report side effects to FDA at 1-800-FDA-1088. Where should I keep my medicine? This drug is given in a hospital or clinic and will not be stored at home. NOTE: This sheet is a summary. It may not cover all possible information. If you have questions about this medicine, talk to your doctor, pharmacist, or health care provider.  2020 Elsevier/Gold Standard (2018-02-23 21:54:17)

## 2020-07-11 NOTE — Progress Notes (Signed)
Cardiology Office Note   Date:  07/14/2020   ID:  [name] French, DOB Feb 24, 1926, MRN [id number]  PCP:  Haywood Pao, MD  Cardiologist:   Chandlar Guice [place], MD   Chief Complaint  Patient presents with  . Tachycardia      History of Present Illness: [name] French is a 86 y.o. female is seen for follow up  of tachycardia. She has no known history of cardiac disease. She has a history of Lung CA receiving RT. Not felt to be a candidate for chemo or surgery. She was admitted overnight 12/25/17 for evaluation of atypical chest pain. CT negative for PE. There was a left pleural effusion. troponins and Ecg were unremarkable. Echo was normal. She was noted to have PACs and mention of a run of NSVT. She was started on metoprolol. Her chest pain was felt to be esophageal.  She was seen by GI 01/25/18 for evaluation of a abnormal colon finding on PET scan. Evaluation never completed.  It was noted that her HR was elevated to ? 150. She had stopped taking metoprolol 2 days prior. No Ecg recorded.   She was  started  on metoprolol at 12.5 mg bid. Initially seemed to do well without symptoms of tachycardia. Since her last visit multiple phone calls stating metoprolol made her feel very fatigued. "I can't put one foot in front of the other" Attempts at tapering dose resulted in increase in HR. Now taking 12.5 mg daily but by next am HR in the 130s.  She did undergo a Myoview study in April 2019 due to chest pain and it was normal. Subsequent CT scans have shown left lung collapse with a large left effusion- stable.   In September she had a THR and this went very well. In February 2021 she underwent right partial mastectomy for recurrent breast CA. Seen by oncology with plans for Adjuvant anastrozole with Herceptin subcutaneously for a year.  She was admitted in June 2021 with respiratory failure and severe hyponatremia. Hyponatremia improved with tolvaptan. 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 Fluid restriction. She had bilateral pleural effusions that were tapped and were transudative. Echo demonstrated moderate pulmonary HTN with normal LV and RV function. She was in Rehab for 2 weeks after.   On follow up today she reports her breathing is alright. She feels tense. States she doesn't feel like herself. Anxious. No chest pain or palpitations. Cardiac function was good by Echo last month.   Past Medical History:  Diagnosis Date  . Allergic rhinitis   . Anemia    during pregnancy  . Arthritis   . Breast cancer ([place]) 05/08/13   right upper inner, invasive mammary  . Breast cancer, right breast ([place]) 04/16/2013   Underwent lumpectomy on 11/06/13. Path showed G1 ILC, 2.7 cm, neg margins, receptor+, Her2neg   . Diverticulosis   . Dyspnea    when carry heavy packages  . Dysrhythmia    Sinus Tach- on Metoprolol  . 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 Hemorrhoids   . Hx of adenomatous colonic polyps 05/2002  . Lung cancer ([place])   . Lung cancer ([place]) 10/2017  . Meningioma ([place])   . Osteoporosis   . Pneumonia     x2 last time was 2019  . Rosacea   . Skin cancer   . Tachycardia   . 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 Use of letrozole (Femara)  neoadjuvant antiestrogen therapy with letrozole 2.5 mg daily x 7 monhts    Past Surgical History:  Procedure Laterality Date  . BREAST BIOPSY Left 1960   lt br bx/benign  . BREAST LUMPECTOMY Right   . BREAST LUMPECTOMY Right 02/10/2020   Procedure: RIGHT BREAST LUMPECTOMY;  Surgeon: Coralie Keens, MD;  Location: [facility];  Service: General;  Laterality: Right;  . BREAST LUMPECTOMY WITH NEEDLE LOCALIZATION Bilateral 11/06/2013   Procedure: BREAST LUMPECTOMY WITH NEEDLE LOCALIZATION;  Surgeon: Haywood Lasso, MD;  Location: [facility];  Service: General;  Laterality: Bilateral;  . COLONOSCOPY    . EYE SURGERY Bilateral    both cataracts  . MOHS SURGERY Right    nose basal/squamous  . TOTAL HIP  ARTHROPLASTY Left 08/31/2018   Procedure: LEFT TOTAL HIP ARTHROPLASTY ANTERIOR APPROACH;  Surgeon: Mcarthur Rossetti, MD;  Location: [facility];  Service: Orthopedics;  Laterality: Left;  Marland Kitchen VIDEO BRONCHOSCOPY WITH ENDOBRONCHIAL NAVIGATION N/A 11/01/2017   Procedure: VIDEO BRONCHOSCOPY WITH ENDOBRONCHIAL NAVIGATION;  Surgeon: [name], MD;  Location: [facility];  Service: Thoracic;  Laterality: N/A;  . WRIST SURGERY  1990   lt     Current Outpatient Medications  Medication Sig Dispense Refill  . acetaminophen (TYLENOL) 500 MG tablet Take 500 mg by mouth every 6 (six) hours as needed for moderate pain or headache.    . anastrozole (ARIMIDEX) 1 MG tablet Take 0.5 tablets (0.5 mg total) by mouth daily. 90 tablet 3  . Artificial Tear Solution (BION TEARS OP) Place 1-2 drops into both eyes as needed (dry eyes).     Marland Kitchen aspirin 81 MG tablet Take 81 mg by mouth daily.    . cetirizine (ZYRTEC) 10 MG tablet Take 10 mg daily by mouth.     . Cholecalciferol (VITAMIN D-3) 1000 UNITS CAPS Take 1,000 Units daily by mouth.     . doxylamine, Sleep, (UNISOM) 25 MG tablet Take 25 mg at bedtime by mouth.    Marland Kitchen Glucosamine-Chondroit-MSM-C-Mn CAPS Take 1 capsule by mouth daily.    Marland Kitchen guaiFENesin-dextromethorphan (ROBITUSSIN DM) 100-10 MG/5ML syrup Take 5 mLs by mouth every 4 (four) hours as needed for cough (chest congestion). 118 mL 1  . Menthol, Topical Analgesic, (BIOFREEZE EX) Apply 1 application topically daily as needed (leg pain).    . metoprolol tartrate (LOPRESSOR) 25 MG tablet TAKE ONE TABLET TWICE DAILY 180 tablet 3  . naphazoline-pheniramine (NAPHCON-A) 0.025-0.3 % ophthalmic solution Place 1-2 drops into both eyes as needed for eye irritation or allergies.     . nitroGLYCERIN (NITROSTAT) 0.4 MG SL tablet Place 0.4 mg under the tongue every 5 (five) minutes as needed for chest pain.    Vladimir Faster Glycol-Propyl Glycol (SYSTANE OP) Place 1 drop into both eyes daily as needed (dry eyes).    .  pravastatin (PRAVACHOL) 40 MG tablet Take 40 mg daily by mouth.     . Probiotic Product (ALIGN) 4 MG CAPS Take 4 mg daily by mouth.    . SUPER B COMPLEX & C TABS Take 1 tablet by mouth daily.  0  . traMADol (ULTRAM) 50 MG tablet Take 1 tablet (50 mg total) by mouth every 6 (six) hours as needed for moderate pain or severe pain. 20 tablet 0  . zolpidem (AMBIEN) 5 MG tablet Take 2.5 mg by mouth at bedtime as needed for sleep.   2   No current facility-administered medications for this visit.    Allergies:   Other, Codeine, Hydrocodone,  and Neosporin [neomycin-bacitracin zn-polymyx]    Social History:  The patient  reports that she quit smoking about 41 years ago. Her smoking use included cigarettes. She has a 12.50 pack-year smoking history. She has never used smokeless tobacco. She reports current alcohol use. She reports that she does not use drugs.   Family History:  The patient's family history includes Heart disease in her mother; Lung cancer in her sister; Prostate cancer in her father.    ROS:  Please see the history of present illness.   Otherwise, review of systems are positive for none.   All other systems are reviewed and negative.    PHYSICAL EXAM: VS:  BP 122/80   Pulse (!) 115   Ht 5\' 5"  (1.651 m)   Wt 133 lb (60.3 kg)   SpO2 91%   BMI 22.13 kg/m  , BMI Body mass index is 22.13 kg/m. GENERAL:  Well appearing EF in NAD HEENT:  PERRL, EOMI, sclera are clear. Oropharynx is clear. NECK:  No jugular venous distention, carotid upstroke brisk and symmetric, no bruits, no thyromegaly or adenopathy LUNGS:  Decreased BS left base. Clear on right. CHEST:  Unremarkable HEART:  RRR,  PMI not displaced or sustained,S1 and S2 within normal limits, no S3, no S4: no clicks, no rubs, no murmurs ABD:  Soft, nontender. BS +, no masses or bruits. No hepatomegaly, no splenomegaly EXT:  2 + pulses throughout, no edema, no cyanosis no clubbing SKIN:  Warm and dry.  No rashes NEURO:  Alert  and oriented x 3. Cranial nerves II through XII intact. PSYCH:  Cognitively intact   EKG:  EKG is not  ordered today.     Recent Labs: 05/22/2020: B Natriuretic Peptide 260.1 05/28/2020: TSH 1.890 06/16/2020: ALT 19; BUN 8; Creatinine 0.74; Hemoglobin 12.1; Platelet Count 226; Potassium 4.4; Sodium 132    Lipid Panel    Component Value Date/Time   CHOL 158 12/26/2017 0025   TRIG 167 (H) 12/26/2017 0025   HDL 51 12/26/2017 0025   CHOLHDL 3.1 12/26/2017 0025   VLDL 33 12/26/2017 0025   LDLCALC 74 12/26/2017 0025    Dated 04/02/18: cholesterol 188, triglycerides 146, HDL 48, LDL 111. CBC and chemistries normal. Dated 04/10/19: cholesterol 171, triglycerides 72, HDL 52, LDL 105. CMET normal Dated 04/10/20: cholesterol 157, triglycerides 67, HDL 61, LDL 83.   Wt Readings from Last 3 Encounters:  07/14/20 133 lb (60.3 kg)  06/16/20 133 lb 3.2 oz (60.4 kg)  06/03/20 137 lb (62.1 kg)      Other studies Reviewed: Additional studies/ records that were reviewed today include:   Echo: 12/26/17:Study Conclusions  - Left ventricle: The cavity size was normal. Systolic function was   normal. The estimated ejection fraction was in the range of 60%   to 65%. Wall motion was normal; there were no regional wall   motion abnormalities. Left ventricular diastolic function   parameters were normal. - Aortic valve: There was mild regurgitation. - Atrial septum: No defect or patent foramen ovale was identified.   Myoview 03/23/18: Study Highlights    Nuclear stress EF: 56%.  Clinically and electrically negative for ischemia  Normal perfusiion No ischemia or scar  This is a low risk study.     CLINICAL DATA:  Restaging non-small cell lung cancer.  EXAM: CT CHEST WITH CONTRAST  TECHNIQUE: Multidetector CT imaging of the chest was performed during intravenous contrast administration.  CONTRAST:  27mL OMNIPAQUE IOHEXOL 300 MG/ML  SOLN  COMPARISON:  01/16/2019  FINDINGS: Cardiovascular: Heart size appears within normal limits. Aortic atherosclerosis. Lad and RCA coronary artery calcifications. No pericardial effusion.  Mediastinum/Nodes: Normal appearance of the thyroid gland. The trachea appears patent and is midline. Normal appearance of the esophagus. No enlarged axillary, supraclavicular, mediastinal or hilar lymph nodes.  Lungs/Pleura: Moderate loculated left pleural effusion unchanged. Masslike architectural distortion within the perihilar left lung measures 7.2 x 4.2 cm, image 94/2. Previously this measured the same. Interval resolution of previously characterized 3 mm left lung subpleural nodule. Stable 3 mm subpleural nodule in the anterior right middle lobe. Chronic scarring within the right middle lobe along the minor fissure is unchanged.  Upper Abdomen: No acute abnormality.  Musculoskeletal: Scoliosis with degenerative disc disease. No suspicious bone lesions.  IMPRESSION: 1. No significant interval change compared with previous exam. 2. No change in size of masslike architectural distortion within the perihilar left lung which is likely related to external beam radiation. 3. Stable loculated right pleural effusion. 4. Resolution of previous 3 mm subpleural nodule in the left lung. 5. Aortic Atherosclerosis (ICD10-I70.0). Coronary artery calcifications.   Electronically Signed   By: Kerby Moors M.D.   On: 07/17/2019 09:48  Echo 05/29/20: IMPRESSIONS    1. Left ventricular ejection fraction, by estimation, is 60 to 65%. The  left ventricle has normal function. The left ventricle has no regional  wall motion abnormalities. Left ventricular diastolic parameters are  consistent with Grade II diastolic  dysfunction (pseudonormalization).  2. Right ventricular systolic function is normal. The right ventricular  size is mildly enlarged. There is moderately elevated pulmonary artery  systolic  pressure. The estimated right ventricular systolic pressure is  96.7 mmHg.  3. Right atrial size was moderately dilated.  4. The mitral valve is normal in structure. Mild mitral valve  regurgitation. No evidence of mitral stenosis.  5. Tricuspid valve regurgitation is moderate.  6. The aortic valve is normal in structure. Aortic valve regurgitation is  mild. No aortic stenosis is present.  7. The inferior vena cava is normal in size with greater than 50%  respiratory variability, suggesting right atrial pressure of 3 mmHg.    ASSESSMENT AND PLAN:  1. Sinus tachycardia. Inappropriate.  HR improved on metoprolol 25 mg bid. Currently asymptomatic. 2. Lung CA s/p RT. Stable by follow up CT in July 2020 with chronic left pleural effusion. S/p bilateral thoracentesis last month- transudative.  3. HTN controlled.  4. Coronary Calcification noted on CT. Myoview study showed normal perfusion. No chest pain 5. Recurrent breast CA on right. On chemo. Echo looked good on Herceptin.   Current medicines are reviewed at length with the patient today.  The patient does not have concerns regarding medicines.   No orders of the defined types were placed in this encounter.    Disposition:   FU 6 months  Signed, Jaselynn Tamas [place], MD  07/14/2020 10:51 AM    [facility] [address], [place], [place], [address] Phone [phone], Fax [phone]

## 2020-07-14 ENCOUNTER — Ambulatory Visit (INDEPENDENT_AMBULATORY_CARE_PROVIDER_SITE_OTHER): Payer: Medicare Other | Admitting: Cardiology

## 2020-07-14 ENCOUNTER — Other Ambulatory Visit: Payer: Self-pay

## 2020-07-14 ENCOUNTER — Encounter: Payer: Self-pay | Admitting: Cardiology

## 2020-07-14 VITALS — BP 122/80 | HR 115 | Ht 65.0 in | Wt 133.0 lb

## 2020-07-14 DIAGNOSIS — I2584 Coronary atherosclerosis due to calcified coronary lesion: Secondary | ICD-10-CM | POA: Diagnosis not present

## 2020-07-14 DIAGNOSIS — I159 Secondary hypertension, unspecified: Secondary | ICD-10-CM

## 2020-07-14 DIAGNOSIS — I2510 Atherosclerotic heart disease of native coronary artery without angina pectoris: Secondary | ICD-10-CM | POA: Diagnosis not present

## 2020-07-14 DIAGNOSIS — R000 Tachycardia, unspecified: Secondary | ICD-10-CM | POA: Diagnosis not present

## 2020-07-27 ENCOUNTER — Telehealth: Payer: Self-pay | Admitting: Cardiology

## 2020-07-27 NOTE — Telephone Encounter (Signed)
LVM for patient to return call to get follow up visit scheduled with Martinique from recall list

## 2020-07-29 NOTE — Progress Notes (Signed)
Patient Care Team: Tisovec, Fransico Him, MD as PCP - General (Internal Medicine) Martinique, Peter M, MD as PCP - Cardiology (Cardiology) Nicholas Lose, MD as Consulting Physician (Hematology and Oncology)  DIAGNOSIS:    ICD-10-CM   1. Malignant neoplasm of upper-inner quadrant of right breast in female, estrogen receptor positive ([place])  C50.211    Z17.0     SUMMARY OF ONCOLOGIC HISTORY: Oncology History  Malignant neoplasm of upper-inner quadrant of right breast in female, estrogen receptor positive ([place])  05/08/2013 Initial Biopsy   Right: grade 1-2 invasive mammary carcinoma ER positive PR positive HER-2/neu negative with Ki-67 30% (MRI 2.8 cm)second smaller mass together 3.8cm   05/11/2013 - 11/04/2013 Anti-estrogen oral therapy   Letrozole 2.5 mg Neoadjuvant anti-estrogen therapy   11/06/2013 Surgery   Bilateral Lumpectomies: Left: sclerosing lesion with ALH fibrocystic changes with microcalcifications. Right: Grade 1 ILC 2.7 cm with LCIS    Radiation Therapy   Patient declined   11/19/2013 - 06/09/2015 Anti-estrogen oral therapy   Letrozole 2.5 mg (stopped for arthralgias and myalgias and fatigue accompanied with hair loss)   01/01/2020 Relapse/Recurrence   Screening mammogram detected a right breast mass. Diagnostic mammogram showed a 4.2cm mass at the 11 o'clock position in the right breast. Biopsy showed invasive mammary carcinoma, grade 2, HER-2 + (3+), ER+ 95%, PR+ 90%, Ki67 20%.    02/10/2020 Surgery   Right lumpectomy Ninfa Linden): mammary carcinoma, grade 2, 3.3cm, clear margins.   03/05/2020 -  Chemotherapy   The patient had trastuzumab-hyaluronidase-oysk (HERCEPTIN HYLECTA) 600-10000 MG-UNT/5ML chemo SQ injection 600 mg, 600 mg, Subcutaneous,  Once, 6 of 12 cycles Administration: 600 mg (03/05/2020), 600 mg (03/26/2020), 600 mg (06/16/2020), 600 mg (04/16/2020), 600 mg (05/07/2020), 600 mg (07/09/2020)  for chemotherapy treatment.    Non-small cell cancer of right lung ([place])    10/20/2017 PET scan   6 cm central left lower lobe pulmonary mass is markedly hypermetabolic with SUV max = 44.0 and extends into the left hilum.  The sub solid 2.4 cm pulmonary nodule in the right middle lobe also shows FDG accumulation with SUV max = 1.9. No evidence for hypermetabolic mediastinal or right hilar lymphadenopathy. L2 uptake degenerative.  LLL:T3N0M0 stage IIb clinical stage; RML: T2N0 Stage 1B   10/20/2017 Imaging   MRI brain: No metastatic disease, right inferior parietal convexity meningioma 2.9 x 2.7 x 1.4 cm.  This indents the brain and associated with mild brain edema   11/01/2017 Initial Diagnosis   Transbronchial needle aspiration right middle lobe and left lower lobe brushings: Both are positive for malignant cells consistent with non-small cell lung cancer    11/29/2017 - 01/18/2018 Radiation Therapy   Radiation   12/01/2017 Pathology Results   Foundation 1:TPS score: 5%; MS-Stable, TMG High, AKT2 Amp, RB1, TP 53 (no mutations noted in EGFR, K-ras, Al, BRAF, RET, ERBB2, Ros 1)     CHIEF COMPLIANT:  Follow-up of recurrent right breast cancer on Herceptin maintenance  INTERVAL HISTORY: [name] French is a 86 y.o. with above-mentioned history of recurrent right breast cancer treated with lumpectomy and who is currently on Herceptin maintenance. She presents to the clinic todayfor treatment.   ALLERGIES:  is allergic to other, codeine, hydrocodone, and neosporin [neomycin-bacitracin zn-polymyx].  MEDICATIONS:  Current Outpatient Medications  Medication Sig Dispense Refill  . acetaminophen (TYLENOL) 500 MG tablet Take 500 mg by mouth every 6 (six) hours as needed for moderate pain or headache.    . anastrozole (ARIMIDEX) 1 MG tablet Take  0.5 tablets (0.5 mg total) by mouth daily. 90 tablet 3  . Artificial Tear Solution (BION TEARS OP) Place 1-2 drops into both eyes as needed (dry eyes).     Marland Kitchen aspirin 81 MG tablet Take 81 mg by mouth daily.    . cetirizine  (ZYRTEC) 10 MG tablet Take 10 mg daily by mouth.     . Cholecalciferol (VITAMIN D-3) 1000 UNITS CAPS Take 1,000 Units daily by mouth.     . doxylamine, Sleep, (UNISOM) 25 MG tablet Take 25 mg at bedtime by mouth.    Marland Kitchen Glucosamine-Chondroit-MSM-C-Mn CAPS Take 1 capsule by mouth daily.    Marland Kitchen guaiFENesin-dextromethorphan (ROBITUSSIN DM) 100-10 MG/5ML syrup Take 5 mLs by mouth every 4 (four) hours as needed for cough (chest congestion). 118 mL 1  . Menthol, Topical Analgesic, (BIOFREEZE EX) Apply 1 application topically daily as needed (leg pain).    . metoprolol tartrate (LOPRESSOR) 25 MG tablet TAKE ONE TABLET TWICE DAILY 180 tablet 3  . naphazoline-pheniramine (NAPHCON-A) 0.025-0.3 % ophthalmic solution Place 1-2 drops into both eyes as needed for eye irritation or allergies.     . nitroGLYCERIN (NITROSTAT) 0.4 MG SL tablet Place 0.4 mg under the tongue every 5 (five) minutes as needed for chest pain.    Vladimir Faster Glycol-Propyl Glycol (SYSTANE OP) Place 1 drop into both eyes daily as needed (dry eyes).    . pravastatin (PRAVACHOL) 40 MG tablet Take 40 mg daily by mouth.     . Probiotic Product (ALIGN) 4 MG CAPS Take 4 mg daily by mouth.    . SUPER B COMPLEX & C TABS Take 1 tablet by mouth daily.  0  . traMADol (ULTRAM) 50 MG tablet Take 1 tablet (50 mg total) by mouth every 6 (six) hours as needed for moderate pain or severe pain. 20 tablet 0  . zolpidem (AMBIEN) 5 MG tablet Take 2.5 mg by mouth at bedtime as needed for sleep.   2   No current facility-administered medications for this visit.    PHYSICAL EXAMINATION: ECOG PERFORMANCE STATUS: 1 - Symptomatic but completely ambulatory  There were no vitals filed for this visit. There were no vitals filed for this visit.  LABORATORY DATA:  I have reviewed the data as listed CMP Latest Ref Rng & Units 06/16/2020 06/11/2020 05/29/2020  Glucose 70 - 99 mg/dL 102(H) - 108(H)  BUN 8 - 23 mg/dL '8 15 16  '$ Creatinine 0.44 - 1.00 mg/dL 0.74 0.5 0.62    Sodium 135 - 145 mmol/L 132(L) 130(A) 133(L)  Potassium 3.5 - 5.1 mmol/L 4.4 5.1 4.6  Chloride 98 - 111 mmol/L 96(L) 94(A) 92(L)  CO2 22 - 32 mmol/L 27 27(A) 32  Calcium 8.9 - 10.3 mg/dL 9.8 9.9 9.2  Total Protein 6.5 - 8.1 g/dL 7.0 - -  Total Bilirubin 0.3 - 1.2 mg/dL 0.5 - -  Alkaline Phos 38 - 126 U/L 71 - -  AST 15 - 41 U/L 18 - -  ALT 0 - 44 U/L 19 - -    Lab Results  Component Value Date   WBC 6.0 06/16/2020   HGB 12.1 06/16/2020   HCT 38.2 06/16/2020   MCV 85.1 06/16/2020   PLT 226 06/16/2020   NEUTROABS 4.3 06/16/2020    ASSESSMENT & PLAN:  Malignant neoplasm of upper-inner quadrant of right breast in female, estrogen receptor positive ([place]) 12/23/2019: Suspicious 4.2 cm mass in the right breast, no abnormal right axillary lymph nodes 01/03/2020:Right breast biopsy 11 o'clock position:  Grade 2 invasive lobular carcinoma with LCIS ER 95%, PR 90%, Ki-67 20%, HER-2 3+ positive  CT CAP 01/17/20:Masslike consolidation following radiation measuring 6.8 x 3.6 cm as compared to 7.2 by 4.2 cm. Pleural thickening approximately 6 mm. No metastatic disease  02/10/2020:Right lumpectomy Ninfa Linden): mammary carcinoma, grade 2, 3.3cm, clear margins.  Treatment plan: Adjuvant anastrozole with Herceptin subcutaneously for a year ------------------------------------------------------------------------------------------------------------------------------------------ Current treatment: Herceptin subcutaneous cycle10 Anastrozole toxicities: 1.Occasional loose stools: She took Imodium 2. feeling of drowsiness or dullness in the head  She takes half a tablet of anastrozole daily.   Fatigue: It could be from anastrozole or metoprolol. Briefly she did not feel comfortable driving but she is now back to driving her car again. so far she appears to be tolerating Herceptin and anastrozole very well.  Hyponatremia: Rechecking her labs today Return to clinic every 3 weeks for  Herceptin every 6 weeks of follow-up with me.    No orders of the defined types were placed in this encounter.  The patient has a good understanding of the overall plan. she agrees with it. she will call with any problems that may develop before the next visit here.  Total time spent: 30 mins including face to face time and time spent for planning, charting and coordination of care  Nicholas Lose, MD 07/30/2020  I, Cloyde Reams Dorshimer, am acting as scribe for Dr. Nicholas Lose.  I have reviewed the above documentation for accuracy and completeness, and I agree with the above.

## 2020-07-30 ENCOUNTER — Inpatient Hospital Stay: Payer: Medicare Other | Attending: Hematology and Oncology | Admitting: Hematology and Oncology

## 2020-07-30 ENCOUNTER — Inpatient Hospital Stay: Payer: Medicare Other

## 2020-07-30 ENCOUNTER — Telehealth: Payer: Self-pay | Admitting: Hematology and Oncology

## 2020-07-30 ENCOUNTER — Other Ambulatory Visit: Payer: Self-pay

## 2020-07-30 DIAGNOSIS — Z7982 Long term (current) use of aspirin: Secondary | ICD-10-CM | POA: Diagnosis not present

## 2020-07-30 DIAGNOSIS — E871 Hypo-osmolality and hyponatremia: Secondary | ICD-10-CM | POA: Insufficient documentation

## 2020-07-30 DIAGNOSIS — Z923 Personal history of irradiation: Secondary | ICD-10-CM | POA: Insufficient documentation

## 2020-07-30 DIAGNOSIS — I2510 Atherosclerotic heart disease of native coronary artery without angina pectoris: Secondary | ICD-10-CM

## 2020-07-30 DIAGNOSIS — R5383 Other fatigue: Secondary | ICD-10-CM | POA: Insufficient documentation

## 2020-07-30 DIAGNOSIS — Z5112 Encounter for antineoplastic immunotherapy: Secondary | ICD-10-CM | POA: Diagnosis not present

## 2020-07-30 DIAGNOSIS — Z79811 Long term (current) use of aromatase inhibitors: Secondary | ICD-10-CM | POA: Diagnosis not present

## 2020-07-30 DIAGNOSIS — C50211 Malignant neoplasm of upper-inner quadrant of right female breast: Secondary | ICD-10-CM

## 2020-07-30 DIAGNOSIS — Z85118 Personal history of other malignant neoplasm of bronchus and lung: Secondary | ICD-10-CM | POA: Insufficient documentation

## 2020-07-30 DIAGNOSIS — Z9221 Personal history of antineoplastic chemotherapy: Secondary | ICD-10-CM | POA: Insufficient documentation

## 2020-07-30 DIAGNOSIS — I2584 Coronary atherosclerosis due to calcified coronary lesion: Secondary | ICD-10-CM | POA: Diagnosis not present

## 2020-07-30 DIAGNOSIS — Z79899 Other long term (current) drug therapy: Secondary | ICD-10-CM | POA: Diagnosis not present

## 2020-07-30 DIAGNOSIS — Z170 Estrogen receptor positive status [ER+]: Secondary | ICD-10-CM

## 2020-07-30 LAB — CBC WITH DIFFERENTIAL (CANCER CENTER ONLY)
Abs Immature Granulocytes: 0.01 10*3/uL (ref 0.00–0.07)
Basophils Absolute: 0.1 10*3/uL (ref 0.0–0.1)
Basophils Relative: 1 %
Eosinophils Absolute: 0.2 10*3/uL (ref 0.0–0.5)
Eosinophils Relative: 2 %
HCT: 40.5 % (ref 36.0–46.0)
Hemoglobin: 12.7 g/dL (ref 12.0–15.0)
Immature Granulocytes: 0 %
Lymphocytes Relative: 16 %
Lymphs Abs: 1.1 10*3/uL (ref 0.7–4.0)
MCH: 26.7 pg (ref 26.0–34.0)
MCHC: 31.4 g/dL (ref 30.0–36.0)
MCV: 85.1 fL (ref 80.0–100.0)
Monocytes Absolute: 0.8 10*3/uL (ref 0.1–1.0)
Monocytes Relative: 11 %
Neutro Abs: 4.9 10*3/uL (ref 1.7–7.7)
Neutrophils Relative %: 70 %
Platelet Count: 215 10*3/uL (ref 150–400)
RBC: 4.76 MIL/uL (ref 3.87–5.11)
RDW: 15.2 % (ref 11.5–15.5)
WBC Count: 7.1 10*3/uL (ref 4.0–10.5)
nRBC: 0 % (ref 0.0–0.2)

## 2020-07-30 LAB — CMP (CANCER CENTER ONLY)
ALT: 19 U/L (ref 0–44)
AST: 21 U/L (ref 15–41)
Albumin: 3.9 g/dL (ref 3.5–5.0)
Alkaline Phosphatase: 70 U/L (ref 38–126)
Anion gap: 8 (ref 5–15)
BUN: 11 mg/dL (ref 8–23)
CO2: 26 mmol/L (ref 22–32)
Calcium: 10.4 mg/dL — ABNORMAL HIGH (ref 8.9–10.3)
Chloride: 102 mmol/L (ref 98–111)
Creatinine: 0.79 mg/dL (ref 0.44–1.00)
GFR, Est AFR Am: >60 mL/min (ref >60)
GFR, Estimated: >60 mL/min (ref >60)
Glucose, Bld: 90 mg/dL (ref 70–99)
Potassium: 4.8 mmol/L (ref 3.5–5.1)
Sodium: 136 mmol/L (ref 135–145)
Total Bilirubin: 0.6 mg/dL (ref 0.3–1.2)
Total Protein: 7.3 g/dL (ref 6.5–8.1)

## 2020-07-30 MED ORDER — DIPHENHYDRAMINE HCL 25 MG PO CAPS
25.0000 mg | ORAL_CAPSULE | Freq: Once | ORAL | Status: AC
  Administered 2020-07-30: 25 mg via ORAL

## 2020-07-30 MED ORDER — ACETAMINOPHEN 325 MG PO TABS
650.0000 mg | ORAL_TABLET | Freq: Once | ORAL | Status: AC
  Administered 2020-07-30: 650 mg via ORAL

## 2020-07-30 MED ORDER — DIPHENHYDRAMINE HCL 25 MG PO CAPS
ORAL_CAPSULE | ORAL | Status: AC
  Filled 2020-07-30: qty 1

## 2020-07-30 MED ORDER — ACETAMINOPHEN 325 MG PO TABS
ORAL_TABLET | ORAL | Status: AC
  Filled 2020-07-30: qty 2

## 2020-07-30 MED ORDER — TRASTUZUMAB-HYALURONIDASE-OYSK 600-10000 MG-UNT/5ML ~~LOC~~ SOLN
600.0000 mg | Freq: Once | SUBCUTANEOUS | Status: AC
  Administered 2020-07-30: 600 mg via SUBCUTANEOUS
  Filled 2020-07-30: qty 5

## 2020-07-30 NOTE — Assessment & Plan Note (Signed)
12/23/2019: Suspicious 4.2 cm mass in the right breast, no abnormal right axillary lymph nodes 01/03/2020:Right breast biopsy 11 o'clock position: Grade 2 invasive lobular carcinoma with LCIS ER 95%, PR 90%, Ki-67 20%, HER-2 3+ positive  CT CAP 01/17/20:Masslike consolidation following radiation measuring 6.8 x 3.6 cm as compared to 7.2 by 4.2 cm. Pleural thickening approximately 6 mm. No metastatic disease  02/10/2020:Right lumpectomy [name] French): mammary carcinoma, grade 2, 3.3cm, clear margins.  Treatment plan: Adjuvant anastrozole with Herceptin subcutaneously for a year ------------------------------------------------------------------------------------------------------------------------------------------ Current treatment: Herceptin subcutaneous cycle10 Anastrozole toxicities: 1.Occasional loose stools: She took Imodium  She takes half a tablet of anastrozole daily. She does not report any side effects from anastrozole.  Fatigue: It could be from anastrozole or metoprolol.  so far she appears to be tolerating Herceptin and anastrozole very well.  Hyponatremia: Today sodium is 132.  Monitoring closely. Return to clinic every 3 weeks for Herceptin every 6 weeks of follow-up with me.

## 2020-07-30 NOTE — Telephone Encounter (Signed)
Scheduled appts per 8/12 los. Gave pt a print out of AVS.

## 2020-07-30 NOTE — Patient Instructions (Signed)
Trastuzumab; Hyaluronidase injection What is this medicine? TRASTUZUMAB; HYALURONIDASE (tras TOO zoo mab / hye al ur ON i dase) is used to treat breast cancer and stomach cancer. Trastuzumab is a monoclonal antibody. Hyaluronidase is used to improve the effects of trastuzumab. This medicine may be used for other purposes; ask your health care provider or pharmacist if you have questions. COMMON BRAND NAME(S): HERCEPTIN HYLECTA What should I tell my health care provider before I take this medicine? They need to know if you have any of these conditions:  heart disease  heart failure  lung or breathing disease, like asthma  an unusual or allergic reaction to trastuzumab, or other medications, foods, dyes, or preservatives  pregnant or trying to get pregnant  breast-feeding How should I use this medicine? This medicine is for injection under the skin. It is given by a health care professional in a hospital or clinic setting. Talk to your pediatrician regarding the use of this medicine in children. This medicine is not approved for use in children. Overdosage: If you think you have taken too much of this medicine contact a poison control center or emergency room at once. NOTE: This medicine is only for you. Do not share this medicine with others. What if I miss a dose? It is important not to miss a dose. Call your doctor or health care professional if you are unable to keep an appointment. What may interact with this medicine? This medicine may interact with the following medications:  certain types of chemotherapy, such as daunorubicin, doxorubicin, epirubicin, and idarubicin This list may not describe all possible interactions. Give your health care provider a list of all the medicines, herbs, non-prescription drugs, or dietary supplements you use. Also tell them if you smoke, drink alcohol, or use illegal drugs. Some items may interact with your medicine. What should I watch for while  using this medicine? Visit your doctor for checks on your progress. Report any side effects. Continue your course of treatment even though you feel ill unless your doctor tells you to stop. Call your doctor or health care professional for advice if you get a fever, chills or sore throat, or other symptoms of a cold or flu. Do not treat yourself. Try to avoid being around people who are sick. You may experience fever, chills and shaking during your first infusion. These effects are usually mild and can be treated with other medicines. Report any side effects during the infusion to your health care professional. Fever and chills usually do not happen with later infusions. Do not become pregnant while taking this medicine or for 7 months after stopping it. Women should inform their doctor if they wish to become pregnant or think they might be pregnant. Women of child-bearing potential will need to have a negative pregnancy test before starting this medicine. There is a potential for serious side effects to an unborn child. Talk to your health care professional or pharmacist for more information. Do not breast-feed an infant while taking this medicine or for 7 months after stopping it. What side effects may I notice from receiving this medicine? Side effects that you should report to your doctor or health care professional as soon as possible:  allergic reactions like skin rash, itching or hives, swelling of the face, lips, or tongue  breathing problems  chest pain or palpitations  cough  fever  general ill feeling or flu-like symptoms  signs of worsening heart failure like breathing problems; swelling in your legs and  feet Side effects that usually do not require medical attention (report these to your doctor or health care professional if they continue or are bothersome):  bone pain  changes in taste  diarrhea  joint pain  nausea/vomiting  unusually weak or tired  weight loss This  list may not describe all possible side effects. Call your doctor for medical advice about side effects. You may report side effects to FDA at 1-800-FDA-1088. Where should I keep my medicine? This drug is given in a hospital or clinic and will not be stored at home. NOTE: This sheet is a summary. It may not cover all possible information. If you have questions about this medicine, talk to your doctor, pharmacist, or health care provider.  2020 Elsevier/Gold Standard (2018-02-23 21:54:17)

## 2020-08-10 DIAGNOSIS — Z961 Presence of intraocular lens: Secondary | ICD-10-CM | POA: Diagnosis not present

## 2020-08-10 DIAGNOSIS — H26492 Other secondary cataract, left eye: Secondary | ICD-10-CM | POA: Diagnosis not present

## 2020-08-10 DIAGNOSIS — H524 Presbyopia: Secondary | ICD-10-CM | POA: Diagnosis not present

## 2020-08-20 ENCOUNTER — Inpatient Hospital Stay: Payer: Medicare Other | Attending: Hematology and Oncology

## 2020-08-20 ENCOUNTER — Other Ambulatory Visit: Payer: Self-pay

## 2020-08-20 VITALS — BP 131/70 | HR 85 | Temp 97.8°F | Resp 16 | Wt 133.5 lb

## 2020-08-20 DIAGNOSIS — Z7982 Long term (current) use of aspirin: Secondary | ICD-10-CM | POA: Insufficient documentation

## 2020-08-20 DIAGNOSIS — M545 Low back pain: Secondary | ICD-10-CM | POA: Insufficient documentation

## 2020-08-20 DIAGNOSIS — Z79811 Long term (current) use of aromatase inhibitors: Secondary | ICD-10-CM | POA: Insufficient documentation

## 2020-08-20 DIAGNOSIS — C50211 Malignant neoplasm of upper-inner quadrant of right female breast: Secondary | ICD-10-CM | POA: Insufficient documentation

## 2020-08-20 DIAGNOSIS — Z5112 Encounter for antineoplastic immunotherapy: Secondary | ICD-10-CM | POA: Diagnosis not present

## 2020-08-20 DIAGNOSIS — Z23 Encounter for immunization: Secondary | ICD-10-CM | POA: Insufficient documentation

## 2020-08-20 DIAGNOSIS — R5383 Other fatigue: Secondary | ICD-10-CM | POA: Insufficient documentation

## 2020-08-20 DIAGNOSIS — E871 Hypo-osmolality and hyponatremia: Secondary | ICD-10-CM | POA: Insufficient documentation

## 2020-08-20 DIAGNOSIS — Z170 Estrogen receptor positive status [ER+]: Secondary | ICD-10-CM | POA: Diagnosis not present

## 2020-08-20 DIAGNOSIS — Z79899 Other long term (current) drug therapy: Secondary | ICD-10-CM | POA: Insufficient documentation

## 2020-08-20 MED ORDER — ACETAMINOPHEN 325 MG PO TABS
650.0000 mg | ORAL_TABLET | Freq: Once | ORAL | Status: AC
  Administered 2020-08-20: 650 mg via ORAL

## 2020-08-20 MED ORDER — DIPHENHYDRAMINE HCL 25 MG PO CAPS
ORAL_CAPSULE | ORAL | Status: AC
  Filled 2020-08-20: qty 1

## 2020-08-20 MED ORDER — ACETAMINOPHEN 325 MG PO TABS
ORAL_TABLET | ORAL | Status: AC
  Filled 2020-08-20: qty 2

## 2020-08-20 MED ORDER — DIPHENHYDRAMINE HCL 25 MG PO CAPS
25.0000 mg | ORAL_CAPSULE | Freq: Once | ORAL | Status: AC
  Administered 2020-08-20: 25 mg via ORAL

## 2020-08-20 MED ORDER — TRASTUZUMAB-HYALURONIDASE-OYSK 600-10000 MG-UNT/5ML ~~LOC~~ SOLN
600.0000 mg | Freq: Once | SUBCUTANEOUS | Status: AC
  Administered 2020-08-20: 600 mg via SUBCUTANEOUS
  Filled 2020-08-20: qty 5

## 2020-08-20 NOTE — Patient Instructions (Signed)
Trastuzumab; Hyaluronidase injection What is this medicine? TRASTUZUMAB; HYALURONIDASE (tras TOO zoo mab / hye al ur ON i dase) is used to treat breast cancer and stomach cancer. Trastuzumab is a monoclonal antibody. Hyaluronidase is used to improve the effects of trastuzumab. This medicine may be used for other purposes; ask your health care provider or pharmacist if you have questions. COMMON BRAND NAME(S): HERCEPTIN HYLECTA What should I tell my health care provider before I take this medicine? They need to know if you have any of these conditions:  heart disease  heart failure  lung or breathing disease, like asthma  an unusual or allergic reaction to trastuzumab, or other medications, foods, dyes, or preservatives  pregnant or trying to get pregnant  breast-feeding How should I use this medicine? This medicine is for injection under the skin. It is given by a health care professional in a hospital or clinic setting. Talk to your pediatrician regarding the use of this medicine in children. This medicine is not approved for use in children. Overdosage: If you think you have taken too much of this medicine contact a poison control center or emergency room at once. NOTE: This medicine is only for you. Do not share this medicine with others. What if I miss a dose? It is important not to miss a dose. Call your doctor or health care professional if you are unable to keep an appointment. What may interact with this medicine? This medicine may interact with the following medications:  certain types of chemotherapy, such as daunorubicin, doxorubicin, epirubicin, and idarubicin This list may not describe all possible interactions. Give your health care provider a list of all the medicines, herbs, non-prescription drugs, or dietary supplements you use. Also tell them if you smoke, drink alcohol, or use illegal drugs. Some items may interact with your medicine. What should I watch for while  using this medicine? Visit your doctor for checks on your progress. Report any side effects. Continue your course of treatment even though you feel ill unless your doctor tells you to stop. Call your doctor or health care professional for advice if you get a fever, chills or sore throat, or other symptoms of a cold or flu. Do not treat yourself. Try to avoid being around people who are sick. You may experience fever, chills and shaking during your first infusion. These effects are usually mild and can be treated with other medicines. Report any side effects during the infusion to your health care professional. Fever and chills usually do not happen with later infusions. Do not become pregnant while taking this medicine or for 7 months after stopping it. Women should inform their doctor if they wish to become pregnant or think they might be pregnant. Women of child-bearing potential will need to have a negative pregnancy test before starting this medicine. There is a potential for serious side effects to an unborn child. Talk to your health care professional or pharmacist for more information. Do not breast-feed an infant while taking this medicine or for 7 months after stopping it. What side effects may I notice from receiving this medicine? Side effects that you should report to your doctor or health care professional as soon as possible:  allergic reactions like skin rash, itching or hives, swelling of the face, lips, or tongue  breathing problems  chest pain or palpitations  cough  fever  general ill feeling or flu-like symptoms  signs of worsening heart failure like breathing problems; swelling in your legs and  feet Side effects that usually do not require medical attention (report these to your doctor or health care professional if they continue or are bothersome):  bone pain  changes in taste  diarrhea  joint pain  nausea/vomiting  unusually weak or tired  weight loss This  list may not describe all possible side effects. Call your doctor for medical advice about side effects. You may report side effects to FDA at 1-800-FDA-1088. Where should I keep my medicine? This drug is given in a hospital or clinic and will not be stored at home. NOTE: This sheet is a summary. It may not cover all possible information. If you have questions about this medicine, talk to your doctor, pharmacist, or health care provider.  2020 Elsevier/Gold Standard (2018-02-23 21:54:17)

## 2020-09-11 ENCOUNTER — Other Ambulatory Visit: Payer: Self-pay | Admitting: *Deleted

## 2020-09-11 DIAGNOSIS — C50211 Malignant neoplasm of upper-inner quadrant of right female breast: Secondary | ICD-10-CM

## 2020-09-13 NOTE — Progress Notes (Signed)
Patient Care Team: Tisovec, Fransico Him, MD as PCP - General (Internal Medicine) Martinique, Peter M, MD as PCP - Cardiology (Cardiology) Nicholas Lose, MD as Consulting Physician (Hematology and Oncology)  DIAGNOSIS:    ICD-10-CM   1. Non-small cell cancer of right lung ([place])  C34.91   2. Malignant neoplasm of upper-inner quadrant of right breast in female, estrogen receptor positive ([place])  C50.211    Z17.0     SUMMARY OF ONCOLOGIC HISTORY: Oncology History  Malignant neoplasm of upper-inner quadrant of right breast in female, estrogen receptor positive ([place])  05/08/2013 Initial Biopsy   Right: grade 1-2 invasive mammary carcinoma ER positive PR positive HER-2/neu negative with Ki-67 30% (MRI 2.8 cm)second smaller mass together 3.8cm   05/11/2013 - 11/04/2013 Anti-estrogen oral therapy   Letrozole 2.5 mg Neoadjuvant anti-estrogen therapy   11/06/2013 Surgery   Bilateral Lumpectomies: Left: sclerosing lesion with ALH fibrocystic changes with microcalcifications. Right: Grade 1 ILC 2.7 cm with LCIS    Radiation Therapy   Patient declined   11/19/2013 - 06/09/2015 Anti-estrogen oral therapy   Letrozole 2.5 mg (stopped for arthralgias and myalgias and fatigue accompanied with hair loss)   01/01/2020 Relapse/Recurrence   Screening mammogram detected a right breast mass. Diagnostic mammogram showed a 4.2cm mass at the 11 o'clock position in the right breast. Biopsy showed invasive mammary carcinoma, grade 2, HER-2 + (3+), ER+ 95%, PR+ 90%, Ki67 20%.    02/10/2020 Surgery   Right lumpectomy Ninfa Linden): mammary carcinoma, grade 2, 3.3cm, clear margins.   02/14/2020 -  Anti-estrogen oral therapy   Anastrozole, half tablet daily   03/05/2020 -  Chemotherapy   The patient had trastuzumab-hyaluronidase-oysk (HERCEPTIN HYLECTA) 600-10000 MG-UNT/5ML chemo SQ injection 600 mg, 600 mg, Subcutaneous,  Once, 8 of 12 cycles Administration: 600 mg (03/05/2020), 600 mg (03/26/2020), 600 mg (06/16/2020), 600  mg (04/16/2020), 600 mg (05/07/2020), 600 mg (07/09/2020), 600 mg (07/30/2020), 600 mg (08/20/2020)  for chemotherapy treatment.    Non-small cell cancer of right lung ([place])  10/20/2017 PET scan   6 cm central left lower lobe pulmonary mass is markedly hypermetabolic with SUV max = 42.6 and extends into the left hilum.  The sub solid 2.4 cm pulmonary nodule in the right middle lobe also shows FDG accumulation with SUV max = 1.9. No evidence for hypermetabolic mediastinal or right hilar lymphadenopathy. L2 uptake degenerative.  LLL:T3N0M0 stage IIb clinical stage; RML: T2N0 Stage 1B   10/20/2017 Imaging   MRI brain: No metastatic disease, right inferior parietal convexity meningioma 2.9 x 2.7 x 1.4 cm.  This indents the brain and associated with mild brain edema   11/01/2017 Initial Diagnosis   Transbronchial needle aspiration right middle lobe and left lower lobe brushings: Both are positive for malignant cells consistent with non-small cell lung cancer    11/29/2017 - 01/18/2018 Radiation Therapy   Radiation   12/01/2017 Pathology Results   Foundation 1:TPS score: 5%; MS-Stable, TMG High, AKT2 Amp, RB1, TP 53 (no mutations noted in EGFR, K-ras, Al, BRAF, RET, ERBB2, Ros 1)     CHIEF COMPLIANT: Follow-up of recurrent right breast cancer on Herceptin maintenance  INTERVAL HISTORY: [name] French is a 86 y.o. with above-mentioned history of right breast cancer treated with lumpectomy and who is currently on Herceptin maintenance and antiestrogen therapy with anastrozole.She presents to the clinic todayfortreatment.   She tells me that she has been having memory issues as well as some fatigue and low back pain issues and therefore she wants  to know if she can stop the Herceptin treatments at this time because it is affecting her quality of life.  She has received 6 months of Herceptin.  ALLERGIES:  is allergic to other, codeine, hydrocodone, and neosporin [neomycin-bacitracin  zn-polymyx].  MEDICATIONS:  Current Outpatient Medications  Medication Sig Dispense Refill  . acetaminophen (TYLENOL) 500 MG tablet Take 500 mg by mouth every 6 (six) hours as needed for moderate pain or headache.    . anastrozole (ARIMIDEX) 1 MG tablet Take 0.5 tablets (0.5 mg total) by mouth daily. 90 tablet 3  . Artificial Tear Solution (BION TEARS OP) Place 1-2 drops into both eyes as needed (dry eyes).     Marland Kitchen aspirin 81 MG tablet Take 81 mg by mouth daily.    . cetirizine (ZYRTEC) 10 MG tablet Take 10 mg daily by mouth.     . Cholecalciferol (VITAMIN D-3) 1000 UNITS CAPS Take 1,000 Units daily by mouth.     . doxylamine, Sleep, (UNISOM) 25 MG tablet Take 25 mg at bedtime by mouth.    Marland Kitchen Glucosamine-Chondroit-MSM-C-Mn CAPS Take 1 capsule by mouth daily.    Marland Kitchen guaiFENesin-dextromethorphan (ROBITUSSIN DM) 100-10 MG/5ML syrup Take 5 mLs by mouth every 4 (four) hours as needed for cough (chest congestion). 118 mL 1  . Menthol, Topical Analgesic, (BIOFREEZE EX) Apply 1 application topically daily as needed (leg pain).    . metoprolol tartrate (LOPRESSOR) 25 MG tablet TAKE ONE TABLET TWICE DAILY 180 tablet 3  . naphazoline-pheniramine (NAPHCON-A) 0.025-0.3 % ophthalmic solution Place 1-2 drops into both eyes as needed for eye irritation or allergies.     . nitroGLYCERIN (NITROSTAT) 0.4 MG SL tablet Place 0.4 mg under the tongue every 5 (five) minutes as needed for chest pain.    Vladimir Faster Glycol-Propyl Glycol (SYSTANE OP) Place 1 drop into both eyes daily as needed (dry eyes).    . pravastatin (PRAVACHOL) 40 MG tablet Take 40 mg daily by mouth.     . Probiotic Product (ALIGN) 4 MG CAPS Take 4 mg daily by mouth.    . SUPER B COMPLEX & C TABS Take 1 tablet by mouth daily.  0  . traMADol (ULTRAM) 50 MG tablet Take 1 tablet (50 mg total) by mouth every 6 (six) hours as needed for moderate pain or severe pain. 20 tablet 0  . zolpidem (AMBIEN) 5 MG tablet Take 2.5 mg by mouth at bedtime as needed for  sleep.   2   No current facility-administered medications for this visit.    PHYSICAL EXAMINATION: ECOG PERFORMANCE STATUS: 1 - Symptomatic but completely ambulatory  Vitals:   09/14/20 1029  BP: 116/66  Pulse: 88  Resp: 18  Temp: 97.6 F (36.4 C)  SpO2: 99%   Filed Weights   09/14/20 1029  Weight: 133 lb 14.4 oz (60.7 kg)    LABORATORY DATA:  I have reviewed the data as listed CMP Latest Ref Rng & Units 09/14/2020 07/30/2020 06/16/2020  Glucose 70 - 99 mg/dL 95 90 102(H)  BUN 8 - 23 mg/dL _0 Creatinine 0.44 - 1.00 mg/dL 0.76 0.79 0.74  Sodium 135 - 145 mmol/L 136 136 132(L)  Potassium 3.5 - 5.1 mmol/L 4.3 4.8 4.4  Chloride 98 - 111 mmol/L 102 102 96(L)  CO2 22 - 32 mmol/L _1 Calcium 8.9 - 10.3 mg/dL 9.7 10.4(H) 9.8  Total Protein 6.5 - 8.1 g/dL 6.9 7.3 7.0  Total Bilirubin 0.3 - 1.2 mg/dL 0.4 0.6 0.5  Alkaline Phos 38 - 126 U/L 73 70 71  AST 15 - 41 U/L _0 ALT 0 - 44 U/L _1 Lab Results  Component Value Date   WBC 5.5 09/14/2020   HGB 12.9 09/14/2020   HCT 40.9 09/14/2020   MCV 85.0 09/14/2020   PLT 206 09/14/2020   NEUTROABS 3.8 09/14/2020    ASSESSMENT & PLAN:  Malignant neoplasm of upper-inner quadrant of right breast in female, estrogen receptor positive ([place]) 12/23/2019: Suspicious 4.2 cm mass in the right breast, no abnormal right axillary lymph nodes 01/03/2020:Right breast biopsy 11 o'clock position: Grade 2 invasive lobular carcinoma with LCIS ER 95%, PR 90%, Ki-67 20%, HER-2 3+ positive  CT CAP 01/17/20:Masslike consolidation following radiation measuring 6.8 x 3.6 cm as compared to 7.2 by 4.2 cm. Pleural thickening approximately 6 mm. No metastatic disease  02/10/2020:Right lumpectomy Ninfa Linden): mammary carcinoma, grade 2, 3.3cm, clear margins.  Treatment plan: Adjuvant anastrozole with Herceptin subcutaneously for a  year ------------------------------------------------------------------------------------------------------------------------------------------ Current treatment: Herceptin subcutaneous cycle9 started 03/05/2020 Anastrozole toxicities: 1.Occasional loose stools: She took Imodium 2. feeling of drowsiness or dullness in the head  She takes half a tablet of anastrozole daily.   Fatigue: It could be from anastrozole or metoprolol. Briefly she did not feel comfortable driving but she is now back to driving her car again. so far she appears to be tolerating Herceptin and anastrozole very well.  Herceptin maintenance: She would like to discontinue Herceptin at this time having received 6 months of anti-HER-2 therapy.  I believe that she did get the bulk of the benefit from anti-HER-2 therapy.  Based on her quality of life concerns we decided to stop it.  Hyponatremia:  Sodium is 136 today. Breast cancer surveillance: Mammogram to be obtained in January 2021.  Return to clinic in 6 months for follow-up and after that we can see her once a year.    No orders of the defined types were placed in this encounter.  The patient has a good understanding of the overall plan. she agrees with it. she will call with any problems that may develop before the next visit here.  Total time spent: 30 mins including face to face time and time spent for planning, charting and coordination of care  Nicholas Lose, MD 09/14/2020  I, Cloyde Reams Dorshimer, am acting as scribe for Dr. Nicholas Lose.  I have reviewed the above documentation for accuracy and completeness, and I agree with the above.

## 2020-09-14 ENCOUNTER — Other Ambulatory Visit: Payer: Self-pay

## 2020-09-14 ENCOUNTER — Inpatient Hospital Stay: Payer: Medicare Other

## 2020-09-14 ENCOUNTER — Inpatient Hospital Stay (HOSPITAL_BASED_OUTPATIENT_CLINIC_OR_DEPARTMENT_OTHER): Payer: Medicare Other | Admitting: Hematology and Oncology

## 2020-09-14 VITALS — BP 116/66 | HR 88 | Temp 97.6°F | Resp 18 | Ht 65.0 in | Wt 133.9 lb

## 2020-09-14 DIAGNOSIS — C3491 Malignant neoplasm of unspecified part of right bronchus or lung: Secondary | ICD-10-CM | POA: Diagnosis not present

## 2020-09-14 DIAGNOSIS — I2584 Coronary atherosclerosis due to calcified coronary lesion: Secondary | ICD-10-CM | POA: Diagnosis not present

## 2020-09-14 DIAGNOSIS — Z170 Estrogen receptor positive status [ER+]: Secondary | ICD-10-CM

## 2020-09-14 DIAGNOSIS — Z79811 Long term (current) use of aromatase inhibitors: Secondary | ICD-10-CM | POA: Diagnosis not present

## 2020-09-14 DIAGNOSIS — I2510 Atherosclerotic heart disease of native coronary artery without angina pectoris: Secondary | ICD-10-CM

## 2020-09-14 DIAGNOSIS — C50211 Malignant neoplasm of upper-inner quadrant of right female breast: Secondary | ICD-10-CM

## 2020-09-14 DIAGNOSIS — Z23 Encounter for immunization: Secondary | ICD-10-CM

## 2020-09-14 DIAGNOSIS — M545 Low back pain: Secondary | ICD-10-CM | POA: Diagnosis not present

## 2020-09-14 DIAGNOSIS — Z5112 Encounter for antineoplastic immunotherapy: Secondary | ICD-10-CM | POA: Diagnosis not present

## 2020-09-14 LAB — CBC WITH DIFFERENTIAL (CANCER CENTER ONLY)
Abs Immature Granulocytes: 0.01 10*3/uL (ref 0.00–0.07)
Basophils Absolute: 0.1 10*3/uL (ref 0.0–0.1)
Basophils Relative: 1 %
Eosinophils Absolute: 0.2 10*3/uL (ref 0.0–0.5)
Eosinophils Relative: 4 %
HCT: 40.9 % (ref 36.0–46.0)
Hemoglobin: 12.9 g/dL (ref 12.0–15.0)
Immature Granulocytes: 0 %
Lymphocytes Relative: 16 %
Lymphs Abs: 0.9 10*3/uL (ref 0.7–4.0)
MCH: 26.8 pg (ref 26.0–34.0)
MCHC: 31.5 g/dL (ref 30.0–36.0)
MCV: 85 fL (ref 80.0–100.0)
Monocytes Absolute: 0.5 10*3/uL (ref 0.1–1.0)
Monocytes Relative: 10 %
Neutro Abs: 3.8 10*3/uL (ref 1.7–7.7)
Neutrophils Relative %: 69 %
Platelet Count: 206 10*3/uL (ref 150–400)
RBC: 4.81 MIL/uL (ref 3.87–5.11)
RDW: 15.9 % — ABNORMAL HIGH (ref 11.5–15.5)
WBC Count: 5.5 10*3/uL (ref 4.0–10.5)
nRBC: 0 % (ref 0.0–0.2)

## 2020-09-14 LAB — CMP (CANCER CENTER ONLY)
ALT: 28 U/L (ref 0–44)
AST: 25 U/L (ref 15–41)
Albumin: 3.6 g/dL (ref 3.5–5.0)
Alkaline Phosphatase: 73 U/L (ref 38–126)
Anion gap: 5 (ref 5–15)
BUN: 12 mg/dL (ref 8–23)
CO2: 29 mmol/L (ref 22–32)
Calcium: 9.7 mg/dL (ref 8.9–10.3)
Chloride: 102 mmol/L (ref 98–111)
Creatinine: 0.76 mg/dL (ref 0.44–1.00)
GFR, Est AFR Am: >60 mL/min (ref >60)
GFR, Estimated: >60 mL/min (ref >60)
Glucose, Bld: 95 mg/dL (ref 70–99)
Potassium: 4.3 mmol/L (ref 3.5–5.1)
Sodium: 136 mmol/L (ref 135–145)
Total Bilirubin: 0.4 mg/dL (ref 0.3–1.2)
Total Protein: 6.9 g/dL (ref 6.5–8.1)

## 2020-09-14 MED ORDER — ACETAMINOPHEN 325 MG PO TABS
650.0000 mg | ORAL_TABLET | Freq: Once | ORAL | Status: AC
  Administered 2020-09-14: 650 mg via ORAL

## 2020-09-14 MED ORDER — INFLUENZA VAC A&B SA ADJ QUAD 0.5 ML IM PRSY
PREFILLED_SYRINGE | INTRAMUSCULAR | Status: AC
  Filled 2020-09-14: qty 0.5

## 2020-09-14 MED ORDER — TRASTUZUMAB-HYALURONIDASE-OYSK 600-10000 MG-UNT/5ML ~~LOC~~ SOLN
600.0000 mg | Freq: Once | SUBCUTANEOUS | Status: AC
  Administered 2020-09-14: 600 mg via SUBCUTANEOUS
  Filled 2020-09-14: qty 5

## 2020-09-14 MED ORDER — INFLUENZA VAC A&B SA ADJ QUAD 0.5 ML IM PRSY
0.5000 mL | PREFILLED_SYRINGE | Freq: Once | INTRAMUSCULAR | Status: AC
  Administered 2020-09-14: 0.5 mL via INTRAMUSCULAR

## 2020-09-14 MED ORDER — ACETAMINOPHEN 325 MG PO TABS
ORAL_TABLET | ORAL | Status: AC
  Filled 2020-09-14: qty 2

## 2020-09-14 MED ORDER — DIPHENHYDRAMINE HCL 25 MG PO CAPS
25.0000 mg | ORAL_CAPSULE | Freq: Once | ORAL | Status: AC
  Administered 2020-09-14: 25 mg via ORAL

## 2020-09-14 MED ORDER — DIPHENHYDRAMINE HCL 25 MG PO CAPS
ORAL_CAPSULE | ORAL | Status: AC
  Filled 2020-09-14: qty 1

## 2020-09-14 NOTE — Patient Instructions (Signed)
Trastuzumab; Hyaluronidase injection What is this medicine? TRASTUZUMAB; HYALURONIDASE (tras TOO zoo mab / hye al ur ON i dase) is used to treat breast cancer and stomach cancer. Trastuzumab is a monoclonal antibody. Hyaluronidase is used to improve the effects of trastuzumab. This medicine may be used for other purposes; ask your health care provider or pharmacist if you have questions. COMMON BRAND NAME(S): HERCEPTIN HYLECTA What should I tell my health care provider before I take this medicine? They need to know if you have any of these conditions:  heart disease  heart failure  lung or breathing disease, like asthma  an unusual or allergic reaction to trastuzumab, or other medications, foods, dyes, or preservatives  pregnant or trying to get pregnant  breast-feeding How should I use this medicine? This medicine is for injection under the skin. It is given by a health care professional in a hospital or clinic setting. Talk to your pediatrician regarding the use of this medicine in children. This medicine is not approved for use in children. Overdosage: If you think you have taken too much of this medicine contact a poison control center or emergency room at once. NOTE: This medicine is only for you. Do not share this medicine with others. What if I miss a dose? It is important not to miss a dose. Call your doctor or health care professional if you are unable to keep an appointment. What may interact with this medicine? This medicine may interact with the following medications:  certain types of chemotherapy, such as daunorubicin, doxorubicin, epirubicin, and idarubicin This list may not describe all possible interactions. Give your health care provider a list of all the medicines, herbs, non-prescription drugs, or dietary supplements you use. Also tell them if you smoke, drink alcohol, or use illegal drugs. Some items may interact with your medicine. What should I watch for while  using this medicine? Visit your doctor for checks on your progress. Report any side effects. Continue your course of treatment even though you feel ill unless your doctor tells you to stop. Call your doctor or health care professional for advice if you get a fever, chills or sore throat, or other symptoms of a cold or flu. Do not treat yourself. Try to avoid being around people who are sick. You may experience fever, chills and shaking during your first infusion. These effects are usually mild and can be treated with other medicines. Report any side effects during the infusion to your health care professional. Fever and chills usually do not happen with later infusions. Do not become pregnant while taking this medicine or for 7 months after stopping it. Women should inform their doctor if they wish to become pregnant or think they might be pregnant. Women of child-bearing potential will need to have a negative pregnancy test before starting this medicine. There is a potential for serious side effects to an unborn child. Talk to your health care professional or pharmacist for more information. Do not breast-feed an infant while taking this medicine or for 7 months after stopping it. What side effects may I notice from receiving this medicine? Side effects that you should report to your doctor or health care professional as soon as possible:  allergic reactions like skin rash, itching or hives, swelling of the face, lips, or tongue  breathing problems  chest pain or palpitations  cough  fever  general ill feeling or flu-like symptoms  signs of worsening heart failure like breathing problems; swelling in your legs and  feet Side effects that usually do not require medical attention (report these to your doctor or health care professional if they continue or are bothersome):  bone pain  changes in taste  diarrhea  joint pain  nausea/vomiting  unusually weak or tired  weight loss This  list may not describe all possible side effects. Call your doctor for medical advice about side effects. You may report side effects to FDA at 1-800-FDA-1088. Where should I keep my medicine? This drug is given in a hospital or clinic and will not be stored at home. NOTE: This sheet is a summary. It may not cover all possible information. If you have questions about this medicine, talk to your doctor, pharmacist, or health care provider.  2020 Elsevier/Gold Standard (2018-02-23 21:54:17)

## 2020-09-14 NOTE — Assessment & Plan Note (Signed)
12/23/2019: Suspicious 4.2 cm mass in the right breast, no abnormal right axillary lymph nodes 01/03/2020:Right breast biopsy 11 o'clock position: Grade 2 invasive lobular carcinoma with LCIS ER 95%, PR 90%, Ki-67 20%, HER-2 3+ positive  CT CAP 01/17/20:Masslike consolidation following radiation measuring 6.8 x 3.6 cm as compared to 7.2 by 4.2 cm. Pleural thickening approximately 6 mm. No metastatic disease  02/10/2020:Right lumpectomy Ninfa Linden): mammary carcinoma, grade 2, 3.3cm, clear margins.  Treatment plan: Adjuvant anastrozole with Herceptin subcutaneously for a year ------------------------------------------------------------------------------------------------------------------------------------------ Current treatment: Herceptin subcutaneous cycle9 started 03/05/2020 Anastrozole toxicities: 1.Occasional loose stools: She took Imodium 2. feeling of drowsiness or dullness in the head  She takes half a tablet of anastrozole daily.   Fatigue: It could be from anastrozole or metoprolol. Briefly she did not feel comfortable driving but she is now back to driving her car again. so far she appears to be tolerating Herceptin and anastrozole very well.  Hyponatremia: Rechecking her labs today Return to clinic every 3 weeks for Herceptin every 6 weeks of follow-up with me.

## 2020-09-15 ENCOUNTER — Telehealth: Payer: Self-pay | Admitting: Hematology and Oncology

## 2020-09-15 NOTE — Telephone Encounter (Signed)
Scheduled per 9/27 los. Called and spoke with pt, confirmed 3/28 appt

## 2020-09-16 ENCOUNTER — Ambulatory Visit: Payer: Medicare Other | Admitting: Cardiology

## 2020-10-08 ENCOUNTER — Ambulatory Visit: Payer: Medicare Other

## 2020-10-08 ENCOUNTER — Other Ambulatory Visit: Payer: Medicare Other

## 2020-10-27 DIAGNOSIS — C3491 Malignant neoplasm of unspecified part of right bronchus or lung: Secondary | ICD-10-CM | POA: Diagnosis not present

## 2020-10-27 DIAGNOSIS — C50211 Malignant neoplasm of upper-inner quadrant of right female breast: Secondary | ICD-10-CM | POA: Diagnosis not present

## 2020-10-27 DIAGNOSIS — Z853 Personal history of malignant neoplasm of breast: Secondary | ICD-10-CM | POA: Diagnosis not present

## 2020-10-27 DIAGNOSIS — E7800 Pure hypercholesterolemia, unspecified: Secondary | ICD-10-CM | POA: Diagnosis not present

## 2020-10-27 DIAGNOSIS — Z170 Estrogen receptor positive status [ER+]: Secondary | ICD-10-CM | POA: Diagnosis not present

## 2020-10-27 DIAGNOSIS — M5416 Radiculopathy, lumbar region: Secondary | ICD-10-CM | POA: Diagnosis not present

## 2020-12-17 ENCOUNTER — Ambulatory Visit
Admission: RE | Admit: 2020-12-17 | Discharge: 2020-12-17 | Disposition: A | Discharge status: Home / Self Care | Payer: Medicare Other | Source: Ambulatory Visit | Attending: Hematology and Oncology | Admitting: Hematology and Oncology

## 2020-12-17 ENCOUNTER — Other Ambulatory Visit: Payer: Self-pay

## 2020-12-17 DIAGNOSIS — R922 Inconclusive mammogram: Secondary | ICD-10-CM | POA: Diagnosis not present

## 2020-12-17 DIAGNOSIS — C3491 Malignant neoplasm of unspecified part of right bronchus or lung: Secondary | ICD-10-CM

## 2020-12-17 DIAGNOSIS — Z853 Personal history of malignant neoplasm of breast: Secondary | ICD-10-CM | POA: Diagnosis not present

## 2020-12-17 DIAGNOSIS — C50211 Malignant neoplasm of upper-inner quadrant of right female breast: Secondary | ICD-10-CM

## 2021-01-19 ENCOUNTER — Other Ambulatory Visit: Payer: Self-pay | Admitting: Urology

## 2021-01-19 DIAGNOSIS — C3432 Malignant neoplasm of lower lobe, left bronchus or lung: Secondary | ICD-10-CM

## 2021-01-19 DIAGNOSIS — C3491 Malignant neoplasm of unspecified part of right bronchus or lung: Secondary | ICD-10-CM

## 2021-01-20 ENCOUNTER — Telehealth: Payer: Self-pay | Admitting: *Deleted

## 2021-01-20 NOTE — Telephone Encounter (Signed)
CALLED PATIENT TO INFORM OF STAT LABS ON 02-01-21 @ 10:30 AM @ [facility] AND HER SCAN TO FOLLOW @ [facility] - ARRIVAL TIME- 11:15 AM, PATIENT TO  HAVE WATER ONLY - 4 HRS. PRIOR TO TEST, LVM FOR A RETURN CALL

## 2021-02-01 ENCOUNTER — Other Ambulatory Visit: Payer: Self-pay

## 2021-02-01 ENCOUNTER — Encounter (HOSPITAL_COMMUNITY): Payer: Self-pay

## 2021-02-01 ENCOUNTER — Ambulatory Visit
Admission: RE | Admit: 2021-02-01 | Discharge: 2021-02-01 | Disposition: A | Discharge status: Home / Self Care | Payer: Medicare Other | Source: Ambulatory Visit | Attending: Urology | Admitting: Urology

## 2021-02-01 ENCOUNTER — Ambulatory Visit (HOSPITAL_COMMUNITY)
Admission: RE | Admit: 2021-02-01 | Discharge: 2021-02-01 | Disposition: A | Discharge status: Home / Self Care | Payer: Medicare Other | Source: Ambulatory Visit | Attending: Urology | Admitting: Urology

## 2021-02-01 DIAGNOSIS — C3432 Malignant neoplasm of lower lobe, left bronchus or lung: Secondary | ICD-10-CM | POA: Insufficient documentation

## 2021-02-01 DIAGNOSIS — C3491 Malignant neoplasm of unspecified part of right bronchus or lung: Secondary | ICD-10-CM | POA: Insufficient documentation

## 2021-02-01 DIAGNOSIS — I2510 Atherosclerotic heart disease of native coronary artery without angina pectoris: Secondary | ICD-10-CM | POA: Diagnosis not present

## 2021-02-01 DIAGNOSIS — M419 Scoliosis, unspecified: Secondary | ICD-10-CM | POA: Diagnosis not present

## 2021-02-01 DIAGNOSIS — K449 Diaphragmatic hernia without obstruction or gangrene: Secondary | ICD-10-CM | POA: Diagnosis not present

## 2021-02-01 DIAGNOSIS — C3490 Malignant neoplasm of unspecified part of unspecified bronchus or lung: Secondary | ICD-10-CM | POA: Diagnosis not present

## 2021-02-01 LAB — BUN & CREATININE (CHCC)
BUN: 15 mg/dL (ref 8–23)
Creatinine: 0.82 mg/dL (ref 0.44–1.00)
GFR, Estimated: >60 mL/min (ref >60)

## 2021-02-01 MED ORDER — IOHEXOL 300 MG/ML  SOLN
75.0000 mL | Freq: Once | INTRAMUSCULAR | Status: AC | PRN
  Administered 2021-02-01: 75 mL via INTRAVENOUS

## 2021-02-05 ENCOUNTER — Telehealth: Payer: Self-pay | Admitting: Urology

## 2021-02-05 DIAGNOSIS — C3491 Malignant neoplasm of unspecified part of right bronchus or lung: Secondary | ICD-10-CM

## 2021-02-05 DIAGNOSIS — C3432 Malignant neoplasm of lower lobe, left bronchus or lung: Secondary | ICD-10-CM

## 2021-02-05 NOTE — Telephone Encounter (Signed)
I called and left a detailed message on the patient's home phone regarding her recent CT chest results.  I advised that the scan looks good with no evidence of any recurrent or progressive disease in the lungs and stable radiation changes.  I advised that she is welcome to call me back with any questions or concerns regarding the scan or her previous radiation but otherwise I will plan for a repeat CT chest scan in approximately 6 months and a follow-up phone call there after to review results and recommendations based on that scan.  Nicholos Johns, MMS, PA-C [facility] [place]: [phone]  Fax: [phone]

## 2021-03-14 NOTE — Progress Notes (Signed)
Patient Care Team: Tisovec, Fransico Him, MD as PCP - General (Internal Medicine) Martinique, Peter M, MD as PCP - Cardiology (Cardiology) Nicholas Lose, MD as Consulting Physician (Hematology and Oncology)  DIAGNOSIS:    ICD-10-CM   1. Primary cancer of left lower lobe of lung (HCC)  C34.32   2. Malignant neoplasm of upper-inner quadrant of right breast in female, estrogen receptor positive ([place])  C50.211    Z17.0     SUMMARY OF ONCOLOGIC HISTORY: Oncology History  Malignant neoplasm of upper-inner quadrant of right breast in female, estrogen receptor positive ([place])  05/08/2013 Initial Biopsy   Right: grade 1-2 invasive mammary carcinoma ER positive PR positive HER-2/neu negative with Ki-67 30% (MRI 2.8 cm)second smaller mass together 3.8cm   05/11/2013 - 11/04/2013 Anti-estrogen oral therapy   Letrozole 2.5 mg Neoadjuvant anti-estrogen therapy   11/06/2013 Surgery   Bilateral Lumpectomies: Left: sclerosing lesion with ALH fibrocystic changes with microcalcifications. Right: Grade 1 ILC 2.7 cm with LCIS    Radiation Therapy   Patient declined   11/19/2013 - 06/09/2015 Anti-estrogen oral therapy   Letrozole 2.5 mg (stopped for arthralgias and myalgias and fatigue accompanied with hair loss)   01/01/2020 Relapse/Recurrence   Screening mammogram detected a right breast mass. Diagnostic mammogram showed a 4.2cm mass at the 11 o'clock position in the right breast. Biopsy showed invasive mammary carcinoma, grade 2, HER-2 + (3+), ER+ 95%, PR+ 90%, Ki67 20%.    02/10/2020 Surgery   Right lumpectomy Ninfa Linden): mammary carcinoma, grade 2, 3.3cm, clear margins.   02/14/2020 -  Anti-estrogen oral therapy   Anastrozole, half tablet daily   03/05/2020 -  Chemotherapy   The patient had trastuzumab-hyaluronidase-oysk (HERCEPTIN HYLECTA) 600-10000 MG-UNT/5ML chemo SQ injection 600 mg, 600 mg, Subcutaneous,  Once, 9 of 12 cycles Administration: 600 mg (03/05/2020), 600 mg (03/26/2020), 600 mg (06/16/2020),  600 mg (04/16/2020), 600 mg (05/07/2020), 600 mg (07/09/2020), 600 mg (07/30/2020), 600 mg (08/20/2020), 600 mg (09/14/2020)  for chemotherapy treatment.    Non-small cell cancer of right lung ([place])  10/20/2017 PET scan   6 cm central left lower lobe pulmonary mass is markedly hypermetabolic with SUV max = 64.6 and extends into the left hilum.  The sub solid 2.4 cm pulmonary nodule in the right middle lobe also shows FDG accumulation with SUV max = 1.9. No evidence for hypermetabolic mediastinal or right hilar lymphadenopathy. L2 uptake degenerative.  LLL:T3N0M0 stage IIb clinical stage; RML: T2N0 Stage 1B   10/20/2017 Imaging   MRI brain: No metastatic disease, right inferior parietal convexity meningioma 2.9 x 2.7 x 1.4 cm.  This indents the brain and associated with mild brain edema   11/01/2017 Initial Diagnosis   Transbronchial needle aspiration right middle lobe and left lower lobe brushings: Both are positive for malignant cells consistent with non-small cell lung cancer    11/29/2017 - 01/18/2018 Radiation Therapy   Radiation   12/01/2017 Pathology Results   Foundation 1:TPS score: 5%; MS-Stable, TMG High, AKT2 Amp, RB1, TP 53 (no mutations noted in EGFR, K-ras, Al, BRAF, RET, ERBB2, Ros 1)     CHIEF COMPLIANT: Follow-up of recurrent right breast cancer on anastrozole  INTERVAL HISTORY: [name] French is a 87 y.o. with above-mentioned history of right breast cancer treated with lumpectomy, Herceptin maintenance, and who is currently on antiestrogen therapy with anastrozole.Mammogram on 12/17/20 showed no evidence of malignancy bilaterally. She presents to the clinic follow-up. Her major complaint today is shortness of breath to exertion especially if she is  lifting heavy objects.  She has been doing water aerobics and that does not seem to be affecting her.     ALLERGIES:  is allergic to other, codeine, hydrocodone, and neosporin [neomycin-bacitracin zn-polymyx].  MEDICATIONS:   Current Outpatient Medications  Medication Sig Dispense Refill  . acetaminophen (TYLENOL) 500 MG tablet Take 500 mg by mouth every 6 (six) hours as needed for moderate pain or headache.    . Artificial Tear Solution (BION TEARS OP) Place 1-2 drops into both eyes as needed (dry eyes).     Marland Kitchen aspirin 81 MG tablet Take 81 mg by mouth daily.    . cetirizine (ZYRTEC) 10 MG tablet Take 10 mg daily by mouth.     . Cholecalciferol (VITAMIN D-3) 1000 UNITS CAPS Take 1,000 Units daily by mouth.     . doxylamine, Sleep, (UNISOM) 25 MG tablet Take 25 mg at bedtime by mouth.    Marland Kitchen Glucosamine-Chondroit-MSM-C-Mn CAPS Take 1 capsule by mouth daily.    Marland Kitchen guaiFENesin-dextromethorphan (ROBITUSSIN DM) 100-10 MG/5ML syrup Take 5 mLs by mouth every 4 (four) hours as needed for cough (chest congestion). 118 mL 1  . Menthol, Topical Analgesic, (BIOFREEZE EX) Apply 1 application topically daily as needed (leg pain).    . metoprolol tartrate (LOPRESSOR) 25 MG tablet TAKE ONE TABLET TWICE DAILY 180 tablet 3  . naphazoline-pheniramine (NAPHCON-A) 0.025-0.3 % ophthalmic solution Place 1-2 drops into both eyes as needed for eye irritation or allergies.     . nitroGLYCERIN (NITROSTAT) 0.4 MG SL tablet Place 0.4 mg under the tongue every 5 (five) minutes as needed for chest pain.    Vladimir Faster Glycol-Propyl Glycol (SYSTANE OP) Place 1 drop into both eyes daily as needed (dry eyes).    . pravastatin (PRAVACHOL) 40 MG tablet Take 40 mg daily by mouth.     . Probiotic Product (ALIGN) 4 MG CAPS Take 4 mg daily by mouth.    . SUPER B COMPLEX & C TABS Take 1 tablet by mouth daily.  0  . traMADol (ULTRAM) 50 MG tablet Take 1 tablet (50 mg total) by mouth every 6 (six) hours as needed for moderate pain or severe pain. 20 tablet 0  . zolpidem (AMBIEN) 5 MG tablet Take 2.5 mg by mouth at bedtime as needed for sleep.   2   No current facility-administered medications for this visit.    PHYSICAL EXAMINATION: ECOG PERFORMANCE STATUS:  1 - Symptomatic but completely ambulatory  Vitals:   03/15/21 1109  BP: 124/62  Pulse: (!) 110  Resp: 19  Temp: 97.7 F (36.5 C)  SpO2: 99%   Filed Weights   03/15/21 1109  Weight: 137 lb 4.8 oz (62.3 kg)    BREAST: No palpable masses or nodules in either right or left breasts. No palpable axillary supraclavicular or infraclavicular adenopathy no breast tenderness or nipple discharge. (exam performed in the presence of a chaperone)  LABORATORY DATA:  I have reviewed the data as listed CMP Latest Ref Rng & Units 02/01/2021 09/14/2020 07/30/2020  Glucose 70 - 99 mg/dL - 95 90  BUN 8 - 23 mg/dL _0 Creatinine 0.44 - 1.00 mg/dL 0.82 0.76 0.79  Sodium 135 - 145 mmol/L - 136 136  Potassium 3.5 - 5.1 mmol/L - 4.3 4.8  Chloride 98 - 111 mmol/L - 102 102  CO2 22 - 32 mmol/L - 29 26  Calcium 8.9 - 10.3 mg/dL - 9.7 10.4(H)  Total Protein 6.5 - 8.1 g/dL - 6.9 7.3  Total Bilirubin 0.3 - 1.2 mg/dL - 0.4 0.6  Alkaline Phos 38 - 126 U/L - 73 70  AST 15 - 41 U/L - 25 21  ALT 0 - 44 U/L - 28 19    Lab Results  Component Value Date   WBC 5.5 09/14/2020   HGB 12.9 09/14/2020   HCT 40.9 09/14/2020   MCV 85.0 09/14/2020   PLT 206 09/14/2020   NEUTROABS 3.8 09/14/2020    ASSESSMENT & PLAN:  Malignant neoplasm of upper-inner quadrant of right breast in female, estrogen receptor positive ([place]) 12/23/2019: Suspicious 4.2 cm mass in the right breast, no abnormal right axillary lymph nodes 01/03/2020:Right breast biopsy 11 o'clock position: Grade 2 invasive lobular carcinoma with LCIS ER 95%, PR 90%, Ki-67 20%, HER-2 3+ positive  CT CAP 01/17/20:Masslike consolidation following radiation measuring 6.8 x 3.6 cm as compared to 7.2 by 4.2 cm. Pleural thickening approximately 6 mm. No metastatic disease  02/10/2020:Right lumpectomy Ninfa Linden): mammary carcinoma, grade 2, 3.3cm, clear margins. Adjuvant anastrozole with Herceptin subcutaneously started 03/05/2020 (discontinued by her preference  on 09/14/20)  ------------------------------------------------------------------------------------------------------------------------------------------ Breast Cancer Surveillance: 1. Breast Exam: 03/15/21: Benign 2. Mammogram: 12/17/20: Benign  Shortness of breath: On exam there is decreased breath sounds in the left lung base.  This is chronic in nature.  I reviewed the CT scan from February 2022.  There is not much we can do to improve her shortness of breath. She does water aerobics and appears to be handling that very well.  RTC in 1 year for follow up    No orders of the defined types were placed in this encounter.  The patient has a good understanding of the overall plan. she agrees with it. she will call with any problems that may develop before the next visit here.  Total time spent: 20 mins including face to face time and time spent for planning, charting and coordination of care  Rulon Eisenmenger, MD, MPH 03/15/2021  I, Cloyde Reams Dorshimer, am acting as scribe for Dr. Nicholas Lose.  I have reviewed the above documentation for accuracy and completeness, and I agree with the above.

## 2021-03-14 NOTE — Assessment & Plan Note (Signed)
12/23/2019: Suspicious 4.2 cm mass in the right breast, no abnormal right axillary lymph nodes 01/03/2020:Right breast biopsy 11 o'clock position: Grade 2 invasive lobular carcinoma with LCIS ER 95%, PR 90%, Ki-67 20%, HER-2 3+ positive  CT CAP 01/17/20:Masslike consolidation following radiation measuring 6.8 x 3.6 cm as compared to 7.2 by 4.2 cm. Pleural thickening approximately 6 mm. No metastatic disease  02/10/2020:Right lumpectomy [name] French): mammary carcinoma, grade 2, 3.3cm, clear margins. Adjuvant anastrozole with Herceptin subcutaneously started 03/05/2020 (discontinued by her preference on 09/14/20)  ------------------------------------------------------------------------------------------------------------------------------------------ Current treatment: Anastrozole 0.5 mg daily Anastrozole toxicities: 1.Fatigue: It could be from anastrozole or metoprolol.  Breast Cancer Surveillance: 1. Breast Exam: 03/15/21: Benign 2. Mammogram: 12/17/20: Benign  RTC in 1 year for follow up

## 2021-03-15 ENCOUNTER — Other Ambulatory Visit: Payer: Self-pay

## 2021-03-15 ENCOUNTER — Inpatient Hospital Stay: Payer: Medicare Other | Attending: Hematology and Oncology | Admitting: Hematology and Oncology

## 2021-03-15 VITALS — BP 124/62 | HR 110 | Temp 97.7°F | Resp 19 | Ht 65.0 in | Wt 137.3 lb

## 2021-03-15 DIAGNOSIS — R0602 Shortness of breath: Secondary | ICD-10-CM | POA: Diagnosis not present

## 2021-03-15 DIAGNOSIS — Z9221 Personal history of antineoplastic chemotherapy: Secondary | ICD-10-CM | POA: Insufficient documentation

## 2021-03-15 DIAGNOSIS — C3432 Malignant neoplasm of lower lobe, left bronchus or lung: Secondary | ICD-10-CM | POA: Diagnosis not present

## 2021-03-15 DIAGNOSIS — Z85118 Personal history of other malignant neoplasm of bronchus and lung: Secondary | ICD-10-CM | POA: Diagnosis not present

## 2021-03-15 DIAGNOSIS — Z79811 Long term (current) use of aromatase inhibitors: Secondary | ICD-10-CM | POA: Diagnosis not present

## 2021-03-15 DIAGNOSIS — Z79899 Other long term (current) drug therapy: Secondary | ICD-10-CM | POA: Insufficient documentation

## 2021-03-15 DIAGNOSIS — Z7982 Long term (current) use of aspirin: Secondary | ICD-10-CM | POA: Insufficient documentation

## 2021-03-15 DIAGNOSIS — Z923 Personal history of irradiation: Secondary | ICD-10-CM | POA: Diagnosis not present

## 2021-03-15 DIAGNOSIS — Z170 Estrogen receptor positive status [ER+]: Secondary | ICD-10-CM | POA: Diagnosis not present

## 2021-03-15 DIAGNOSIS — C50211 Malignant neoplasm of upper-inner quadrant of right female breast: Secondary | ICD-10-CM | POA: Diagnosis not present

## 2021-03-30 ENCOUNTER — Other Ambulatory Visit: Payer: Self-pay | Admitting: Cardiology

## 2021-04-15 DIAGNOSIS — E7800 Pure hypercholesterolemia, unspecified: Secondary | ICD-10-CM | POA: Diagnosis not present

## 2021-04-15 DIAGNOSIS — M810 Age-related osteoporosis without current pathological fracture: Secondary | ICD-10-CM | POA: Diagnosis not present

## 2021-04-22 DIAGNOSIS — C50211 Malignant neoplasm of upper-inner quadrant of right female breast: Secondary | ICD-10-CM | POA: Diagnosis not present

## 2021-04-22 DIAGNOSIS — C3491 Malignant neoplasm of unspecified part of right bronchus or lung: Secondary | ICD-10-CM | POA: Diagnosis not present

## 2021-04-22 DIAGNOSIS — Z96642 Presence of left artificial hip joint: Secondary | ICD-10-CM | POA: Diagnosis not present

## 2021-04-22 DIAGNOSIS — I700 Atherosclerosis of aorta: Secondary | ICD-10-CM | POA: Diagnosis not present

## 2021-04-22 DIAGNOSIS — Z853 Personal history of malignant neoplasm of breast: Secondary | ICD-10-CM | POA: Diagnosis not present

## 2021-04-22 DIAGNOSIS — Z1331 Encounter for screening for depression: Secondary | ICD-10-CM | POA: Diagnosis not present

## 2021-04-22 DIAGNOSIS — Z1339 Encounter for screening examination for other mental health and behavioral disorders: Secondary | ICD-10-CM | POA: Diagnosis not present

## 2021-04-22 DIAGNOSIS — M810 Age-related osteoporosis without current pathological fracture: Secondary | ICD-10-CM | POA: Diagnosis not present

## 2021-04-22 DIAGNOSIS — E7800 Pure hypercholesterolemia, unspecified: Secondary | ICD-10-CM | POA: Diagnosis not present

## 2021-04-22 DIAGNOSIS — Z170 Estrogen receptor positive status [ER+]: Secondary | ICD-10-CM | POA: Diagnosis not present

## 2021-04-22 DIAGNOSIS — Z0000 Encounter for general adult medical examination without abnormal findings: Secondary | ICD-10-CM | POA: Diagnosis not present

## 2021-04-22 DIAGNOSIS — R82998 Other abnormal findings in urine: Secondary | ICD-10-CM | POA: Diagnosis not present

## 2021-05-21 ENCOUNTER — Other Ambulatory Visit (HOSPITAL_BASED_OUTPATIENT_CLINIC_OR_DEPARTMENT_OTHER): Payer: Self-pay

## 2021-05-21 ENCOUNTER — Other Ambulatory Visit: Payer: Self-pay

## 2021-05-21 ENCOUNTER — Ambulatory Visit: Payer: Medicare Other | Attending: Internal Medicine

## 2021-05-21 ENCOUNTER — Encounter: Payer: Self-pay | Admitting: Hematology and Oncology

## 2021-05-21 DIAGNOSIS — Z23 Encounter for immunization: Secondary | ICD-10-CM

## 2021-05-21 MED ORDER — COVID-19 MRNA VACC (MODERNA) 100 MCG/0.5ML IM SUSP
INTRAMUSCULAR | 0 refills | Status: DC
  Filled 2021-05-21: qty 0.25, 1d supply, fill #0

## 2021-05-21 NOTE — Progress Notes (Signed)
   Covid-19 Vaccination Clinic  Name:  [name] French    MRN: [id number] DOB: Feb 15, 1926  05/21/2021  [name] was observed post Covid-19 immunization for 15 minutes without incident. She was provided with Vaccine Information Sheet and instruction to access the V-Safe system.   [name] was instructed to call 911 with any severe reactions post vaccine: Marland Kitchen Difficulty breathing  . Swelling of face and throat  . A fast heartbeat  . A bad rash all over body  . Dizziness and weakness   Immunizations Administered    Name Date Dose VIS Date Route   Moderna Covid-19 Booster Vaccine 05/21/2021 11:30 AM 0.25 mL 10/07/2020 Intramuscular   Manufacturer: Moderna   Lot: [id number]   [place]: 93810-175-10

## 2021-06-22 NOTE — Progress Notes (Signed)
Cardiology Clinic Note   Patient Name: [name] French Date of Encounter: 06/24/2021  Primary Care Provider:  Haywood Pao, MD Primary Cardiologist:  Peter Martinique, MD  Patient Profile    [name] French 87 year old female presents to the clinic today for follow-up evaluation of her tachycardia.  Past Medical History    Past Medical History:  Diagnosis Date   Allergic rhinitis    Anemia    during pregnancy   Arthritis    Breast cancer ([facility]) 05/08/13   right upper inner, invasive mammary   Breast cancer, right breast ([facility]) 04/16/2013   Underwent lumpectomy on 11/06/13. 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   Use of letrozole ([facility])    neoadjuvant antiestrogen therapy with letrozole 2.5 mg daily x 7 monhts   Past Surgical History:  Procedure Laterality Date   BREAST BIOPSY Left 1960   lt br bx/benign   BREAST LUMPECTOMY Right    BREAST LUMPECTOMY Right 02/10/2020   Procedure: RIGHT BREAST LUMPECTOMY;  Surgeon: Coralie Keens, MD;  Location: [facility];  Service: General;  Laterality: Right;   BREAST LUMPECTOMY WITH NEEDLE LOCALIZATION Bilateral 11/06/2013   Procedure: BREAST LUMPECTOMY WITH NEEDLE LOCALIZATION;  Surgeon: Haywood Lasso, MD;  Location: [facility];  Service: General;  Laterality: Bilateral;   COLONOSCOPY     EYE SURGERY Bilateral    both cataracts   MOHS SURGERY Right    nose basal/squamous   TOTAL HIP ARTHROPLASTY Left 08/31/2018   Procedure: LEFT TOTAL HIP ARTHROPLASTY ANTERIOR APPROACH;  Surgeon: Mcarthur Rossetti, MD;  Location: [facility];  Service: Orthopedics;  Laterality: Left;   VIDEO BRONCHOSCOPY WITH ENDOBRONCHIAL NAVIGATION N/A 11/01/2017   Procedure: VIDEO BRONCHOSCOPY WITH ENDOBRONCHIAL NAVIGATION;  Surgeon: [name], MD;  Location: [facility];  Service: Thoracic;  Laterality: N/A;   WRIST SURGERY  1990   lt    Allergies  Allergies  Allergen Reactions   Other Anaphylaxis    Lobster- "years ago"   Codeine Itching    Insomnia    Hydrocodone Itching and Other (See Comments)    Insomnia   Neosporin [Neomycin-Bacitracin Zn-Polymyx] Rash    History of Present Illness    [name] French has a PMH of secondary hypertension, coronary artery calcification, and tachycardia.  Her PMH also includes lung cancer with radiation therapy.  She was not felt to be a candidate for chemotherapy or surgery.  She was admitted overnight 12/25/2017 for evaluation of atypical chest pain.  Her CT chest was negative for PE.  She was noted to have left pleural effusion.  Her troponins and EKG were unremarkable.  Her echocardiogram was normal.  She was noted to have PACs and there was mention of a run of NSVT.  She was started on metoprolol.  Her chest pain was felt to be esophageal in nature.  She was seen by GI  01/25/2018 for evaluation of abnormal colon finding on her PET scan.  Follow-up evaluation was never completed.  It was noted that her heart rate was elevated to 150 bpm.  She had stopped taking her metoprolol 2 days prior to her evaluation.  No EKG was ordered at that time.  He was started back on her metoprolol 12.5 mg twice daily.  Initially she did well without side effects.  She did eventually report feeling very fatigued.  Attempts to taper Lyrica metoprolol resulted in increased heart rates.  She switched to 12.5 mg daily and she reported heart rates in the 130s in the a.m.'s.  She underwent nuclear stress test 4/19 for chest pain.  Her exam was unremarkable.  Subsequent CT scans showed left  lung collapse with a large left effusion.  She was admitted to the hospital 6/21 with respiratory failure and severe hyponatremia.  Her hyponatremia improved with tolvaptan.  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  She is placed on fluid restriction.  She was noted to have bilateral pleural effusions.  She underwent thoracentesis which produced transudative fluid.  An echocardiogram at that time showed moderate pulmonary hypertension, normal LV function, normal RV function.  She was transferred to inpatient rehab for 2 weeks after.  She was seen by Dr. Martinique on 07/14/2020.  During that time she reported that her breathing was okay.  She felt tense.  She reported not feeling like herself and anxious.  She denied chest pain or palpitations.  She presents the clinic today for follow-up evaluation states she continues to have shortness of breath with increased physical activity.  She is doing water aerobics 3 times a week for 45 minutes and continues to walk with a fair distance to her activities and the dining hall.  She is currently not receiving cancer treatment but continues to follow with oncology for her lung cancer and breast cancer.  She reports that she has daily phlegm in the morning that dissipates throughout the day.  She did have 1 episode of chest discomfort several months ago that dissipated without intervention.  She reports "she almost forgot about the episode. "  She has not had any recurrent chest discomfort and does not notice chest discomfort with increased physical activity.  We discussed the importance of deep breathing and coughing.  I will have her continue her physical activity, practice deep breathing and coughing 2-3 times per day, and follow-up in 12 months.  Today she denies chest pain, shortness of breath, lower extremity edema, fatigue, palpitations, melena, hematuria, hemoptysis, diaphoresis, weakness, presyncope, syncope, orthopnea, and PND.   Home  Medications    Prior to Admission medications   Medication Sig Start Date End Date Taking? Authorizing Provider  acetaminophen (TYLENOL) 500 MG tablet Take 500 mg by mouth every 6 (six) hours as needed for moderate pain or headache.    [provider]  Artificial Tear Solution (BION TEARS OP) Place 1-2 drops into both eyes as needed (dry eyes).     [provider]  aspirin 81 MG tablet Take 81 mg by mouth daily.    [provider]  cetirizine (ZYRTEC) 10 MG tablet Take 10 mg daily by mouth.     [provider]  Cholecalciferol (VITAMIN D-3) 1000 UNITS CAPS Take 1,000 Units daily by mouth.     [provider]  COVID-19 mRNA vaccine, Moderna, 100 MCG/0.5ML injection Inject into the muscle. 05/21/21   Carlyle Basques, MD  doxylamine,  Sleep, (UNISOM) 25 MG tablet Take 25 mg at bedtime by mouth.    [provider]  Glucosamine-Chondroit-MSM-C-Mn CAPS Take 1 capsule by mouth daily.    [provider]  guaiFENesin-dextromethorphan (ROBITUSSIN DM) 100-10 MG/5ML syrup Take 5 mLs by mouth every 4 (four) hours as needed for cough (chest congestion). 05/29/20   Shelly Coss, MD  Menthol, Topical Analgesic, (BIOFREEZE EX) Apply 1 application topically daily as needed (leg pain).    [provider]  metoprolol tartrate (LOPRESSOR) 25 MG tablet TAKE ONE TABLET TWICE DAILY 03/30/21   Martinique, Peter M, MD  naphazoline-pheniramine (NAPHCON-A) 0.025-0.3 % ophthalmic solution Place 1-2 drops into both eyes as needed for eye irritation or allergies.     [provider]  nitroGLYCERIN (NITROSTAT) 0.4 MG SL tablet Place 0.4 mg under the tongue every 5 (five) minutes as needed for chest pain.    [provider]  Polyethyl Glycol-Propyl Glycol (SYSTANE OP) Place 1 drop into both eyes daily as needed (dry eyes).    [provider]  pravastatin (PRAVACHOL) 40 MG tablet Take 40 mg daily by mouth.  03/31/13   [provider]  Probiotic Product (ALIGN) 4 MG CAPS Take 4 mg daily by mouth.    [provider]  SUPER B COMPLEX & C TABS Take 1 tablet by mouth daily. 10/17/16   Nicholas Lose, MD  traMADol (ULTRAM) 50 MG tablet Take 1 tablet (50 mg total) by mouth every 6 (six) hours as needed for moderate pain or severe pain. 02/10/20   Coralie Keens, MD  zolpidem (AMBIEN) 5 MG tablet Take 2.5 mg by mouth at bedtime as needed for sleep.  03/26/15   [provider]    Family History    Family History  Problem Relation Age of Onset   Heart disease Mother    Prostate cancer Father    Lung cancer Sister    She indicated that her mother is deceased. She indicated that her father is deceased. She indicated that one of her two sisters is deceased. She indicated that her maternal grandmother is deceased. She indicated that her maternal grandfather is deceased. She indicated that her paternal grandmother is deceased. She indicated that her paternal grandfather is deceased.  Social History    Social History   Socioeconomic History   Marital status: Divorced    Spouse name: Not on file   Number of children: 2   Years of education: Not on file   Highest education level: Not on file  Occupational History   Occupation: Retired  Tobacco Use   Smoking status: Former    Packs/day: 0.50    Years: 25.00    Pack years: 12.50    Types: Cigarettes    Quit date: 12/19/1978    Years since quitting: 42.5   Smokeless tobacco: Never  Vaping Use   Vaping Use: Never used  Substance and Sexual Activity   Alcohol use: Yes    Comment: "wine usually, no taste in the last in past few weeks- (02/07/2020)   Drug use: No   Sexual activity: Not Currently    Comment: menarche at age14, menopause in 55, HRT x 75, age at first live birth 16's, G59 P3  Other Topics Concern   Not on file  Social History Narrative   Not on file   Social Determinants of Health   Financial Resource Strain: Not on file  Food  Insecurity: Not on file  Transportation Needs: Not on file  Physical  Activity: Not on file  Stress: Not on file  Social Connections: Not on file  Intimate Partner Violence: Not on file     Review of Systems    General:  No chills, fever, night sweats or weight changes.  Cardiovascular:  No chest pain, dyspnea on exertion, edema, orthopnea, palpitations, paroxysmal nocturnal dyspnea. Dermatological: No rash, lesions/masses Respiratory: No cough, dyspnea Urologic: No hematuria, dysuria Abdominal:   No nausea, vomiting, diarrhea, bright red blood per rectum, melena, or hematemesis Neurologic:  No visual changes, wkns, changes in mental status. All other systems reviewed and are otherwise negative except as noted above.  Physical Exam    VS:  BP 128/68   Pulse 88   Ht 5\' 5"  (1.651 m)   Wt 136 lb 6.4 oz (61.9 kg)   SpO2 95%   BMI 22.70 kg/m  , BMI Body mass index is 22.7 kg/m. GEN: Well nourished, well developed, in no acute distress. HEENT: normal. Neck: Supple, no JVD, carotid bruits, or masses. Cardiac: RRR, no murmurs, rubs, or gallops. No clubbing, cyanosis, edema.  Radials/DP/PT 2+ and equal bilaterally.  Respiratory:  Respirations regular and unlabored, clear to auscultation bilaterally.  Diminished lung sounds left lower lobe GI: Soft, nontender, nondistended, BS + x 4. MS: no deformity or atrophy. Skin: warm and dry, no rash. Neuro:  Strength and sensation are intact. Psych: Normal affect.  Accessory Clinical Findings    Recent Labs: 09/14/2020: ALT 28; Hemoglobin 12.9; Platelet Count 206; Potassium 4.3; Sodium 136 02/01/2021: BUN 15; Creatinine 0.82   Recent Lipid Panel    Component Value Date/Time   CHOL 158 12/26/2017 0025   TRIG 167 (H) 12/26/2017 0025   HDL 51 12/26/2017 0025   CHOLHDL 3.1 12/26/2017 0025   VLDL 33 12/26/2017 0025   LDLCALC 74 12/26/2017 0025    ECG personally reviewed by me today-normal sinus rhythm no ST or T wave deviation 88  bpm  Nuclear stress test 03/23/2018 Nuclear stress EF: 56%. Clinically and electrically negative for ischemia Normal perfusiion No ischemia or scar This is a low risk study.     Echocardiogram 05/29/2020 IMPRESSIONS     1. Left ventricular ejection fraction, by estimation, is 60 to 65%. The  left ventricle has normal function. The left ventricle has no regional  wall motion abnormalities. Left ventricular diastolic parameters are  consistent with Grade II diastolic  dysfunction (pseudonormalization).   2. Right ventricular systolic function is normal. The right ventricular  size is mildly enlarged. There is moderately elevated pulmonary artery  systolic pressure. The estimated right ventricular systolic pressure is  10.2 mmHg.   3. Right atrial size was moderately dilated.   4. The mitral valve is normal in structure. Mild mitral valve  regurgitation. No evidence of mitral stenosis.   5. Tricuspid valve regurgitation is moderate.   6. The aortic valve is normal in structure. Aortic valve regurgitation is  mild. No aortic stenosis is present.   7. The inferior vena cava is normal in size with greater than 50%  respiratory variability, suggesting right atrial pressure of 3 mmHg.  Assessment & Plan   1.  Sinus tachycardia-EKG today shows normal sinus rhythm 88 bpm.  No recent episodes of increased or irregular heartbeats. Continue metoprolol Heart healthy low-sodium diet-salty 6 given Increase physical activity as tolerated  Essential hypertension-BP today 128/68.  Well-controlled at home. Continue metoprolol Heart healthy low-sodium diet-salty 6 given Increase physical activity as tolerated Repeat BMP  Coronary calcium-denies recent episodes of chest  discomfort.  Nuclear stress test showed normal perfusion. Continue aspirin, metoprolol, nitroglycerin, pravastatin Heart healthy low-sodium diet-salty 6 given Increase physical activity as tolerated  Lung cancer-status  postradiation therapy.  Follow-up CT showed stable findings 7/20.  Noted to have chronic left pleural effusion has done well since bilateral thoracentesis.  Breast cancer-completed chemotherapy.  There is a recurrent breast CA on the right. Follows with oncology  Disposition: Follow-up with Dr. Martinique in 12 months.  Jossie Ng. Zela Sobieski NP-C    06/24/2021, 12:00 PM [facility] [address] Office [phone] Fax [phone]  Notice: This dictation was prepared with Dragon dictation along with smaller phrase technology. Any transcriptional errors that result from this process are unintentional and may not be corrected upon review.  I spent 14 minutes examining this patient, reviewing medications, and using patient centered shared decision making involving her cardiac care.  Prior to her visit I spent greater than 20 minutes reviewing her past medical history,  medications, and prior cardiac tests.

## 2021-06-24 ENCOUNTER — Encounter: Payer: Self-pay | Admitting: General Practice

## 2021-06-24 ENCOUNTER — Ambulatory Visit (INDEPENDENT_AMBULATORY_CARE_PROVIDER_SITE_OTHER): Payer: Medicare Other | Admitting: General Practice

## 2021-06-24 ENCOUNTER — Other Ambulatory Visit: Payer: Self-pay

## 2021-06-24 VITALS — BP 128/68 | HR 88 | Ht 65.0 in | Wt 136.4 lb

## 2021-06-24 DIAGNOSIS — I159 Secondary hypertension, unspecified: Secondary | ICD-10-CM

## 2021-06-24 DIAGNOSIS — I2510 Atherosclerotic heart disease of native coronary artery without angina pectoris: Secondary | ICD-10-CM | POA: Diagnosis not present

## 2021-06-24 DIAGNOSIS — C3490 Malignant neoplasm of unspecified part of unspecified bronchus or lung: Secondary | ICD-10-CM

## 2021-06-24 DIAGNOSIS — I2584 Coronary atherosclerosis due to calcified coronary lesion: Secondary | ICD-10-CM

## 2021-06-24 DIAGNOSIS — C50919 Malignant neoplasm of unspecified site of unspecified female breast: Secondary | ICD-10-CM

## 2021-06-24 DIAGNOSIS — R000 Tachycardia, unspecified: Secondary | ICD-10-CM | POA: Diagnosis not present

## 2021-06-24 NOTE — Patient Instructions (Signed)
Medication Instructions:  The current medical regimen is effective;  continue present plan and medications as directed. Please refer to the Current Medication list given to you today.  *If you need a refill on your cardiac medications before your next appointment, please call your pharmacy*  Lab Work:   Testing/Procedures:  NONE    NONE  Special Instructions COUGH 2-3 TIMES A DAY/COUPLE TIMES A DAY  PLEASE MAINTAIN PHYSICAL ACTIVITY AS TOLERATED  Follow-Up: Your next appointment:  12 month(s) In Person with You may see Peter Martinique, MDIF UNAVAILABLE Coletta Memos, FNP-C  or one of the following Advanced Practice Providers on your designated Care Team:  Almyra Deforest, PA-C  Fabian Sharp, [place] or Roby Lofts, PA-C   Please call our office 2 months in advance to schedule this appointment   At [facility], you and your health needs are our priority.  As part of our continuing mission to provide you with exceptional heart care, we have created designated Provider Care Teams.  These Care Teams include your primary Cardiologist (physician) and Advanced Practice Providers (APPs -  Physician Assistants and Nurse Practitioners) who all work together to provide you with the care you need, when you need it.

## 2021-07-26 ENCOUNTER — Observation Stay (HOSPITAL_BASED_OUTPATIENT_CLINIC_OR_DEPARTMENT_OTHER)
Admission: EM | Admit: 2021-07-26 | Discharge: 2021-07-27 | Disposition: A | Discharge status: Home / Self Care | Payer: Medicare Other | Attending: Family Medicine | Admitting: Family Medicine

## 2021-07-26 ENCOUNTER — Emergency Department (HOSPITAL_BASED_OUTPATIENT_CLINIC_OR_DEPARTMENT_OTHER): Payer: Medicare Other

## 2021-07-26 ENCOUNTER — Other Ambulatory Visit: Payer: Self-pay

## 2021-07-26 ENCOUNTER — Encounter (HOSPITAL_BASED_OUTPATIENT_CLINIC_OR_DEPARTMENT_OTHER): Payer: Self-pay | Admitting: *Deleted

## 2021-07-26 DIAGNOSIS — Z7982 Long term (current) use of aspirin: Secondary | ICD-10-CM | POA: Insufficient documentation

## 2021-07-26 DIAGNOSIS — Z79899 Other long term (current) drug therapy: Secondary | ICD-10-CM | POA: Diagnosis not present

## 2021-07-26 DIAGNOSIS — I10 Essential (primary) hypertension: Secondary | ICD-10-CM | POA: Diagnosis not present

## 2021-07-26 DIAGNOSIS — Z96642 Presence of left artificial hip joint: Secondary | ICD-10-CM | POA: Diagnosis not present

## 2021-07-26 DIAGNOSIS — Z87442 Personal history of urinary calculi: Secondary | ICD-10-CM | POA: Diagnosis not present

## 2021-07-26 DIAGNOSIS — C50211 Malignant neoplasm of upper-inner quadrant of right female breast: Secondary | ICD-10-CM

## 2021-07-26 DIAGNOSIS — R000 Tachycardia, unspecified: Secondary | ICD-10-CM | POA: Diagnosis not present

## 2021-07-26 DIAGNOSIS — Z20822 Contact with and (suspected) exposure to covid-19: Secondary | ICD-10-CM | POA: Insufficient documentation

## 2021-07-26 DIAGNOSIS — Z170 Estrogen receptor positive status [ER+]: Secondary | ICD-10-CM | POA: Diagnosis not present

## 2021-07-26 DIAGNOSIS — R079 Chest pain, unspecified: Secondary | ICD-10-CM | POA: Diagnosis not present

## 2021-07-26 DIAGNOSIS — C3432 Malignant neoplasm of lower lobe, left bronchus or lung: Secondary | ICD-10-CM | POA: Diagnosis not present

## 2021-07-26 DIAGNOSIS — Z85118 Personal history of other malignant neoplasm of bronchus and lung: Secondary | ICD-10-CM | POA: Diagnosis not present

## 2021-07-26 DIAGNOSIS — R0789 Other chest pain: Principal | ICD-10-CM | POA: Insufficient documentation

## 2021-07-26 DIAGNOSIS — Z85828 Personal history of other malignant neoplasm of skin: Secondary | ICD-10-CM | POA: Insufficient documentation

## 2021-07-26 DIAGNOSIS — J90 Pleural effusion, not elsewhere classified: Secondary | ICD-10-CM | POA: Diagnosis not present

## 2021-07-26 DIAGNOSIS — Z853 Personal history of malignant neoplasm of breast: Secondary | ICD-10-CM | POA: Diagnosis not present

## 2021-07-26 DIAGNOSIS — I313 Pericardial effusion (noninflammatory): Secondary | ICD-10-CM | POA: Diagnosis not present

## 2021-07-26 DIAGNOSIS — R0602 Shortness of breath: Secondary | ICD-10-CM | POA: Diagnosis not present

## 2021-07-26 LAB — CBC
HCT: 42.2 % (ref 36.0–46.0)
Hemoglobin: 14 g/dL (ref 12.0–15.0)
MCH: 28.5 pg (ref 26.0–34.0)
MCHC: 33.2 g/dL (ref 30.0–36.0)
MCV: 85.8 fL (ref 80.0–100.0)
Platelets: 201 10*3/uL (ref 150–400)
RBC: 4.92 MIL/uL (ref 3.87–5.11)
RDW: 14.7 % (ref 11.5–15.5)
WBC: 10.1 10*3/uL (ref 4.0–10.5)
nRBC: 0 % (ref 0.0–0.2)

## 2021-07-26 LAB — RESP PANEL BY RT-PCR (FLU A&B, COVID) ARPGX2
Influenza A by PCR: NEGATIVE
Influenza B by PCR: NEGATIVE
SARS Coronavirus 2 by RT PCR: NEGATIVE

## 2021-07-26 LAB — HEPATIC FUNCTION PANEL
ALT: 9 U/L (ref 0–44)
AST: 12 U/L — ABNORMAL LOW (ref 15–41)
Albumin: 4.2 g/dL (ref 3.5–5.0)
Alkaline Phosphatase: 53 U/L (ref 38–126)
Bilirubin, Direct: 0.2 mg/dL (ref 0.0–0.2)
Indirect Bilirubin: 0.9 mg/dL (ref 0.3–0.9)
Total Bilirubin: 1.1 mg/dL (ref 0.3–1.2)
Total Protein: 6.9 g/dL (ref 6.5–8.1)

## 2021-07-26 LAB — BASIC METABOLIC PANEL
Anion gap: 8 (ref 5–15)
BUN: 16 mg/dL (ref 8–23)
CO2: 29 mmol/L (ref 22–32)
Calcium: 9.4 mg/dL (ref 8.9–10.3)
Chloride: 96 mmol/L — ABNORMAL LOW (ref 98–111)
Creatinine, Ser: 0.7 mg/dL (ref 0.44–1.00)
GFR, Estimated: >60 mL/min (ref >60)
Glucose, Bld: 108 mg/dL — ABNORMAL HIGH (ref 70–99)
Potassium: 4.2 mmol/L (ref 3.5–5.1)
Sodium: 133 mmol/L — ABNORMAL LOW (ref 135–145)

## 2021-07-26 LAB — LIPASE, BLOOD: Lipase: 17 U/L (ref 11–51)

## 2021-07-26 LAB — TROPONIN I (HIGH SENSITIVITY)
Troponin I (High Sensitivity): 5 ng/L (ref <18)
Troponin I (High Sensitivity): 5 ng/L (ref <18)

## 2021-07-26 MED ORDER — METOPROLOL TARTRATE 12.5 MG HALF TABLET
12.5000 mg | ORAL_TABLET | Freq: Two times a day (BID) | ORAL | Status: DC
  Administered 2021-07-26 – 2021-07-27 (×2): 12.5 mg via ORAL
  Filled 2021-07-26 (×2): qty 1

## 2021-07-26 MED ORDER — ZOLPIDEM TARTRATE 5 MG PO TABS
2.5000 mg | ORAL_TABLET | Freq: Every evening | ORAL | Status: DC | PRN
  Administered 2021-07-26: 2.5 mg via ORAL
  Filled 2021-07-26: qty 1

## 2021-07-26 MED ORDER — NITROGLYCERIN 0.4 MG SL SUBL: 0.4000 mg | SUBLINGUAL_TABLET | Freq: Once | SUBLINGUAL | Status: DC

## 2021-07-26 MED ORDER — IOHEXOL 350 MG/ML SOLN
60.0000 mL | Freq: Once | INTRAVENOUS | Status: AC | PRN
  Administered 2021-07-26: 60 mL via INTRAVENOUS

## 2021-07-26 MED ORDER — ACETAMINOPHEN 325 MG PO TABS: 650.0000 mg | ORAL_TABLET | ORAL | Status: DC | PRN

## 2021-07-26 MED ORDER — ONDANSETRON HCL 4 MG/2ML IJ SOLN: 4.0000 mg | Freq: Four times a day (QID) | INTRAMUSCULAR | Status: DC | PRN

## 2021-07-26 MED ORDER — ENOXAPARIN SODIUM 40 MG/0.4ML IJ SOSY
40.0000 mg | PREFILLED_SYRINGE | INTRAMUSCULAR | Status: DC
  Administered 2021-07-26: 40 mg via SUBCUTANEOUS
  Filled 2021-07-26: qty 0.4

## 2021-07-26 MED ORDER — ASPIRIN 81 MG PO CHEW
81.0000 mg | CHEWABLE_TABLET | Freq: Every day | ORAL | Status: DC
  Administered 2021-07-27: 81 mg via ORAL
  Filled 2021-07-26: qty 1

## 2021-07-26 NOTE — H&P (Signed)
History and Physical    [name] French DDU:[id number] DOB: 1926-02-25 DOA: 07/26/2021  PCP: Haywood Pao, MD  Patient coming from: Home  I have personally briefly reviewed patient's old medical records in [place]  Chief Complaint: CP  HPI: [name] French is a 87 y.o. female with medical history significant of HTN, s.tach, breast CA, NSCLC s/p radiation.   She was not felt to be a candidate for chemotherapy or surgery.  Pt takes metoprolol for SVT.  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  Stress test 4/19 - neg.  Pt has chronic LLL mass-like consolidation and collapse seen on all prior CTs.  Pt presents to ED at [facility] today with c/o CP.  Symptoms onset at rest about 12h PTA.  Currently CP free.  Nothing seems to make symptoms worse, symptoms improved with tylenol.  No abd pain, no cough, no fever, no SOB, no palpitations.   ED Course: Trop neg x2.  CT scan neg for PE, does show chronic LLL mass-like consolidation and LLL collapse.   Review of Systems: As per HPI, otherwise all review of systems negative.  Past Medical History:  Diagnosis Date   Allergic rhinitis    Anemia    during pregnancy   Arthritis    Breast cancer ([place]) 05/08/13   right upper inner, invasive mammary   Breast cancer, right breast ([place]) 04/16/2013   Underwent lumpectomy on 11/06/13. 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   Use of letrozole ([place])    neoadjuvant antiestrogen therapy with letrozole 2.5 mg daily x 7 monhts    Past Surgical  History:  Procedure Laterality Date   BREAST BIOPSY Left 1960   lt br bx/benign   BREAST LUMPECTOMY Right    BREAST LUMPECTOMY Right 02/10/2020   Procedure: RIGHT BREAST LUMPECTOMY;  Surgeon: Coralie Keens, MD;  Location: [place];  Service: General;  Laterality: Right;   BREAST LUMPECTOMY WITH NEEDLE LOCALIZATION Bilateral 11/06/2013   Procedure: BREAST LUMPECTOMY WITH NEEDLE LOCALIZATION;  Surgeon: Haywood Lasso, MD;  Location: [place];  Service: General;  Laterality: Bilateral;   COLONOSCOPY     EYE SURGERY Bilateral    both cataracts   MOHS SURGERY Right    nose basal/squamous   TOTAL HIP ARTHROPLASTY Left 08/31/2018   Procedure: LEFT TOTAL HIP ARTHROPLASTY ANTERIOR APPROACH;  Surgeon: Mcarthur Rossetti, MD;  Location: [facility];  Service: Orthopedics;  Laterality: Left;   VIDEO BRONCHOSCOPY WITH ENDOBRONCHIAL NAVIGATION N/A 11/01/2017   Procedure: VIDEO BRONCHOSCOPY WITH ENDOBRONCHIAL NAVIGATION;  Surgeon: [name], MD;  Location: [place];  Service: Thoracic;  Laterality: N/A;   [place]   lt     reports that she quit smoking about 42 years ago. Her smoking use included cigarettes. She has a 12.50 pack-year smoking history. She has never used smokeless tobacco. She  reports current alcohol use. She reports that she does not use drugs.  Allergies  Allergen Reactions   Other Anaphylaxis    Lobster- "years ago"   Codeine Itching    Insomnia    Hydrocodone Itching and Other (See Comments)    Insomnia   Neosporin [Neomycin-Bacitracin Zn-Polymyx] Rash    Family History  Problem Relation Age of Onset   Heart disease Mother    Prostate cancer Father    Lung cancer Sister      Prior to Admission medications   Medication Sig Start Date End Date Taking? Authorizing Provider  acetaminophen (TYLENOL) 500 MG tablet Take 500 mg by mouth every 6 (six) hours as needed for moderate pain or headache.    [provider]  Artificial  Tear Solution (BION TEARS OP) Place 1-2 drops into both eyes as needed (dry eyes).     [provider]  aspirin 81 MG tablet Take 81 mg by mouth daily.    [provider]  B Complex-C-Folic Acid (SUPER B COMPLEX/FA/VIT C) TABS Take by mouth.    [provider]  cetirizine (ZYRTEC) 10 MG tablet Take 10 mg by mouth daily.    [provider]  Cholecalciferol (VITAMIN D-3) 1000 UNITS CAPS Take 1,000 Units daily by mouth.     [provider]  doxylamine, Sleep, (UNISOM) 25 MG tablet Take 25 mg at bedtime by mouth.    [provider]  Glucosamine-Chondroit-MSM-C-Mn CAPS Take 1 capsule by mouth daily.    [provider]  guaiFENesin-dextromethorphan (ROBITUSSIN DM) 100-10 MG/5ML syrup Take 5 mLs by mouth every 4 (four) hours as needed for cough (chest congestion). 05/29/20   Shelly Coss, MD  Menthol, Topical Analgesic, (BIOFREEZE EX) Apply 1 application topically daily as needed (leg pain).    [provider]  metoprolol tartrate (LOPRESSOR) 25 MG tablet TAKE ONE TABLET TWICE DAILY 03/30/21   [place], Peter M, MD  naphazoline-pheniramine (NAPHCON-A) 0.025-0.3 % ophthalmic solution Place 1-2 drops into both eyes as needed for eye irritation or allergies.     [provider]  nitroGLYCERIN (NITROSTAT) 0.4 MG SL tablet Place 0.4 mg under the tongue every 5 (five) minutes as needed for chest pain.    [provider]  Polyethyl Glycol-Propyl Glycol (SYSTANE OP) Place 1 drop into both eyes daily as needed (dry eyes).    [provider]  pravastatin (PRAVACHOL) 40 MG tablet Take 40 mg daily by mouth.  03/31/13   [provider]  Probiotic Product (ALIGN) 4 MG CAPS Take 4 mg daily by mouth.    [provider]  sulfamethoxazole-trimethoprim (BACTRIM DS) 800-160 MG tablet Take 1 tablet by mouth 2 (two) times daily. 05/03/21   [provider]  traMADol (ULTRAM) 50 MG tablet Take 1 tablet (50 mg  total) by mouth every 6 (six) hours as needed for moderate pain or severe pain. 02/10/20   Coralie Keens, MD  zolpidem (AMBIEN) 5 MG tablet Take 2.5 mg by mouth at bedtime as needed for sleep.  03/26/15   [provider]    Physical Exam: Vitals:   07/26/21 1724 07/26/21 1800 07/26/21 1930 07/26/21 2052  BP: 100/67 (!) 106/55 129/65 128/68  Pulse: 93 (!) 103 (!) 111 (!) 108  Resp: (!) 27 (!) 23 (!) 25 19  Temp:    (!) 96 F (35.6 C)  TempSrc:    Axillary  SpO2: 95% 96% 93% 99%  Weight:      Height:  Constitutional: NAD, calm, comfortable Eyes: PERRL, lids and conjunctivae normal ENMT: Mucous membranes are moist. Posterior pharynx clear of any exudate or lesions.Normal dentition.  Neck: normal, supple, no masses, no thyromegaly Respiratory: Diminished left base Cardiovascular: Mild tachycardia Abdomen: no tenderness, no masses palpated. No hepatosplenomegaly. Bowel sounds positive.  Musculoskeletal: no clubbing / cyanosis. No joint deformity upper and lower extremities. Good ROM, no contractures. Normal muscle tone.  Skin: no rashes, lesions, ulcers. No induration Neurologic: CN 2-12 grossly intact. Sensation intact, DTR normal. Strength 5/5 in all 4.  Psychiatric: Normal judgment and insight. Alert and oriented x 3. Normal mood.    Labs on Admission: I have personally reviewed following labs and imaging studies  CBC: Recent Labs  Lab 07/26/21 1026  WBC 10.1  HGB 14.0  HCT 42.2  MCV 85.8  PLT 643   Basic Metabolic Panel: Recent Labs  Lab 07/26/21 1026  NA 133*  K 4.2  CL 96*  CO2 29  GLUCOSE 108*  BUN 16  CREATININE 0.70  CALCIUM 9.4   GFR: Estimated Creatinine Clearance: 40.4 mL/min (by C-G formula based on SCr of 0.7 mg/dL). Liver Function Tests: Recent Labs  Lab 07/26/21 1026  AST 12*  ALT 9  ALKPHOS 53  BILITOT 1.1  PROT 6.9  ALBUMIN 4.2   Recent Labs  Lab 07/26/21 1026  LIPASE 17   No results for input(s): AMMONIA in the  last 168 hours. Coagulation Profile: No results for input(s): INR, PROTIME in the last 168 hours. Cardiac Enzymes: No results for input(s): CKTOTAL, CKMB, CKMBINDEX, TROPONINI in the last 168 hours. BNP (last 3 results) No results for input(s): PROBNP in the last 8760 hours. HbA1C: No results for input(s): HGBA1C in the last 72 hours. CBG: No results for input(s): GLUCAP in the last 168 hours. Lipid Profile: No results for input(s): CHOL, HDL, LDLCALC, TRIG, CHOLHDL, LDLDIRECT in the last 72 hours. Thyroid Function Tests: No results for input(s): TSH, T4TOTAL, FREET4, T3FREE, THYROIDAB in the last 72 hours. Anemia Panel: No results for input(s): VITAMINB12, FOLATE, FERRITIN, TIBC, IRON, RETICCTPCT in the last 72 hours. Urine analysis:    Component Value Date/Time   COLORURINE STRAW (A) 02/06/2018 1702   APPEARANCEUR CLEAR 02/06/2018 1702   LABSPEC 1.001 (L) 02/06/2018 1702   PHURINE 7.0 02/06/2018 1702   GLUCOSEU NEGATIVE 02/06/2018 1702   HGBUR NEGATIVE 02/06/2018 1702   BILIRUBINUR NEGATIVE 02/06/2018 1702   KETONESUR 5 (A) 02/06/2018 1702   PROTEINUR NEGATIVE 02/06/2018 1702   NITRITE NEGATIVE 02/06/2018 1702   LEUKOCYTESUR NEGATIVE 02/06/2018 1702    Radiological Exams on Admission: CT Angio Chest PE W and/or Wo Contrast  Result Date: 07/26/2021 CLINICAL DATA:  Developed chest discomfort last night. Shortness of breath, history of breast cancer and lung cancer. EXAM: CT ANGIOGRAPHY CHEST WITH CONTRAST TECHNIQUE: Multidetector CT imaging of the chest was performed using the standard protocol during bolus administration of intravenous contrast. Multiplanar CT image reconstructions and MIPs were obtained to evaluate the vascular anatomy. CONTRAST:  5mL OMNIPAQUE IOHEXOL 350 MG/ML SOLN COMPARISON:  Same day chest radiograph, CT chest 02/01/2021 FINDINGS: Cardiovascular: Satisfactory opacification of the pulmonary arteries to the segmental level. No evidence of pulmonary embolism.  The left lower lobe pulmonary artery is occluded shortly after it enters the masslike consolidation in the left lower lobe, similar to the prior study. The heart is at the upper limits of normal for size. There is a small pericardial effusion, also present on the prior study of 02/01/2021. There is  aortic atherosclerosis and coronary artery calcification. Mediastinum/Nodes: There is a 1.1 cm subcarinal lymph node, nonspecific and not significantly changed in size. There is no pathologic mediastinal, hilar, or axillary lymphadenopathy. Lungs/Pleura: The trachea and central airways are patent. The left lower lobe bronchus is occluded within a masslike consolidation left lower lobe. The consolidation measures approximately 6.7 cm AP by 3.8 cm TV, overall similar to the prior study. There is no other focal consolidation. There is mosaic attenuation in the lung apices suggestive of air trapping/small airway disease. Pleural thickening along the left lateral chest wall is unchanged. There is a loculated left pleural effusion, similar to the prior study. There is no right pleural effusion. There is no pneumothorax. Upper Abdomen: The imaged portions of the upper abdominal viscera are unremarkable. Musculoskeletal: There is no acute osseous abnormality. Review of the MIP images confirms the above findings. IMPRESSION: 1. No evidence of acute or chronic pulmonary embolus. 2. Occlusion of the left lower lobe pulmonary artery within the masslike consolidation in the left lower lobe. The left lower lobe bronchus is also included within this consolidation. The appearance is similar to prior studies. 3. Unchanged small pericardial effusion. 4. Mosaic attenuation in the lungs suggests air trapping/small airway disease. 5. Aortic atherosclerosis and coronary artery calcifications, unchanged. Electronically Signed   By: Valetta Mole MD   On: 07/26/2021 12:14   DG Chest Portable 1 View  Result Date: 07/26/2021 CLINICAL DATA:   Chest pain and discomfort beginning yesterday. Shortness of breath. EXAM: PORTABLE CHEST 1 VIEW COMPARISON:  05/28/2020 FINDINGS: Chronic left effusion and volume loss in the left lower lung. No pleural effusion seen on the right. Mild chronic scarring in the right midlung. Very similar appearance to the comparison exam. IMPRESSION: Similar appearance to the study of June. Left effusion with chronic volume loss at the left lower lung. Electronically Signed   By: Nelson Chimes M.D.   On: 07/26/2021 10:24    EKG: Independently reviewed.  Assessment/Plan Principal Problem:   Chest pain Active Problems:   Malignant neoplasm of upper-inner quadrant of right breast in female, estrogen receptor positive ([place])   Primary cancer of left lower lobe of lung ([facility])   Sinus tachycardia    CP r/o - CP obs pathway Serial trops Tele monitor NPO after [place] Cards eval in AM S.Tach - Chronic Cont home metoprolol she takes for control NSCLC - Unchanged LLL findings on CT chest today Breast CA - Supposed to be on anastrozole according to onc March note but dont see it on med rec. Med rec still pending  DVT prophylaxis: Lovenox Code Status: DNR Family Communication: No family in room Disposition Plan: Home after cleared by cards Consults called: Message sent to [facility] for cards eval in AM Admission status: Place in [place]     Murial Beam, [place] Hospitalists  How to contact the [facility] Attending or Consulting provider [place] or covering provider during after hours [place], for this patient?  Check the care team in [facility] and look for a) attending/consulting [facility] provider listed and b) the [facility] team listed Log into [email or web address]  Amion Physician Scheduling and messaging for groups and whole hospitals  On call and physician scheduling software for group practices, residents, hospitalists and other medical providers for call, clinic, rotation and shift schedules. On[employer] is a hospital-wide  system for scheduling doctors and paging doctors on call. EasyPlot is for scientific plotting and data analysis.  [email or web address]  and use [facility]'s universal password to access. If you do not have the password, please contact the hospital operator.  Locate the [facility] provider you are looking for under [facility] and page to a number that you can be directly reached. If you still have difficulty reaching the provider, please page the [facility] (Director on Call) for the Hospitalists listed on amion for assistance.  07/26/2021, 9:16 PM

## 2021-07-26 NOTE — ED Provider Notes (Signed)
[facility] DEPT Provider Note   CSN: [id number] Arrival date & time: 07/26/21  1005     History Chief Complaint  Patient presents with   Chest Pain    [name] French is a 87 y.o. female.  The history is provided by the patient.  Chest Pain Pain location:  L chest and R chest Pain quality: aching   Pain radiates to:  Does not radiate Pain severity:  Mild Onset quality:  Gradual Duration:  12 hours Timing:  Constant Progression:  Improving Chronicity:  New Context: at rest   Relieved by: tylenol. Worsened by:  Nothing Associated symptoms: no abdominal pain, no back pain, no cough, no fever, no palpitations, no shortness of breath and no vomiting   Risk factors: high cholesterol   Risk factors: no coronary artery disease, no diabetes mellitus, no hypertension and no prior DVT/PE       Past Medical History:  Diagnosis Date   Allergic rhinitis    Anemia    during pregnancy   Arthritis    Breast cancer ([place]) 05/08/13   right upper inner, invasive mammary   Breast cancer, right breast ([place]) 04/16/2013   Underwent lumpectomy on 11/06/13. 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   Use of letrozole ([place])    neoadjuvant antiestrogen therapy with letrozole 2.5 mg daily x 7 monhts    Patient Active Problem List   Diagnosis Date Noted   Hyponatremia 05/22/2020   Senile osteoporosis 09/14/2018   Sleep disturbances 09/14/2018   History of breast cancer 09/14/2018   ACP (advance care planning) 09/14/2018   Status post total  replacement of left hip 08/31/2018   Unilateral primary osteoarthritis, left hip 08/06/2018   Pleural effusion on left 07/11/2018   Chest pain 12/25/2017   Abnormal findings on radiological examination of gastrointestinal tract 11/16/2017   Primary cancer of left lower lobe of lung ([place]) 11/15/2017   Non-small cell cancer of right lung ([place]) 11/07/2017   Lung mass 10/17/2017   Malignant neoplasm of upper-inner quadrant of right breast in female, estrogen receptor positive ([place]) 06/10/2013   Mass of breast, left 05/31/2013   PERSONAL HISTORY OF COLONIC POLYPS 07/26/2010    Past Surgical History:  Procedure Laterality Date   BREAST BIOPSY Left 1960   lt br bx/benign   BREAST LUMPECTOMY Right    BREAST LUMPECTOMY Right 02/10/2020   Procedure: RIGHT BREAST LUMPECTOMY;  Surgeon: Coralie Keens, MD;  Location: [facility];  Service: General;  Laterality: Right;   BREAST LUMPECTOMY WITH NEEDLE LOCALIZATION Bilateral 11/06/2013   Procedure: BREAST LUMPECTOMY WITH NEEDLE LOCALIZATION;  Surgeon: Haywood Lasso, MD;  Location: [facility];  Service: General;  Laterality: Bilateral;   COLONOSCOPY     EYE SURGERY Bilateral    both cataracts   MOHS SURGERY Right    nose basal/squamous   TOTAL HIP ARTHROPLASTY Left 08/31/2018  Procedure: LEFT TOTAL HIP ARTHROPLASTY ANTERIOR APPROACH;  Surgeon: Mcarthur Rossetti, MD;  Location: [facility];  Service: Orthopedics;  Laterality: Left;   VIDEO BRONCHOSCOPY WITH ENDOBRONCHIAL NAVIGATION N/A 11/01/2017   Procedure: VIDEO BRONCHOSCOPY WITH ENDOBRONCHIAL NAVIGATION;  Surgeon: [name], MD;  Location: [facility];  Service: Thoracic;  Laterality: N/A;   WRIST SURGERY  1990   lt     OB History   No obstetric history on file.     Family History  Problem Relation Age of Onset   Heart disease Mother    Prostate cancer Father    Lung cancer Sister     Social History   Tobacco Use   Smoking status: Former    Packs/day: 0.50     Years: 25.00    Pack years: 12.50    Types: Cigarettes    Quit date: 12/19/1978    Years since quitting: 42.6   Smokeless tobacco: Never  Vaping Use   Vaping Use: Never used  Substance Use Topics   Alcohol use: Yes    Comment: "wine usually, no taste in the last in past few weeks- (02/07/2020)   Drug use: No    Home Medications Prior to Admission medications   Medication Sig Start Date End Date Taking? Authorizing Provider  acetaminophen (TYLENOL) 500 MG tablet Take 500 mg by mouth every 6 (six) hours as needed for moderate pain or headache.    [provider]  Artificial Tear Solution (BION TEARS OP) Place 1-2 drops into both eyes as needed (dry eyes).     [provider]  aspirin 81 MG tablet Take 81 mg by mouth daily.    [provider]  B Complex-C-Folic Acid (SUPER B COMPLEX/FA/VIT C) TABS Take by mouth.    [provider]  cetirizine (ZYRTEC) 10 MG tablet Take 10 mg by mouth daily.    [provider]  Cholecalciferol (VITAMIN D-3) 1000 UNITS CAPS Take 1,000 Units daily by mouth.     [provider]  doxylamine, Sleep, (UNISOM) 25 MG tablet Take 25 mg at bedtime by mouth.    [provider]  Glucosamine-Chondroit-MSM-C-Mn CAPS Take 1 capsule by mouth daily.    [provider]  guaiFENesin-dextromethorphan (ROBITUSSIN DM) 100-10 MG/5ML syrup Take 5 mLs by mouth every 4 (four) hours as needed for cough (chest congestion). 05/29/20   Shelly Coss, MD  Menthol, Topical Analgesic, (BIOFREEZE EX) Apply 1 application topically daily as needed (leg pain).    [provider]  metoprolol tartrate (LOPRESSOR) 25 MG tablet TAKE ONE TABLET TWICE DAILY 03/30/21   Martinique, Peter M, MD  naphazoline-pheniramine (NAPHCON-A) 0.025-0.3 % ophthalmic solution Place 1-2 drops into both eyes as needed for eye irritation or allergies.     [provider]  nitroGLYCERIN (NITROSTAT) 0.4 MG SL tablet Place 0.4 mg under  the tongue every 5 (five) minutes as needed for chest pain.    [provider]  Polyethyl Glycol-Propyl Glycol (SYSTANE OP) Place 1 drop into both eyes daily as needed (dry eyes).    [provider]  pravastatin (PRAVACHOL) 40 MG tablet Take 40 mg daily by mouth.  03/31/13   [provider]  Probiotic Product (ALIGN) 4 MG CAPS Take 4 mg daily by mouth.    [provider]  sulfamethoxazole-trimethoprim (BACTRIM DS) 800-160 MG tablet Take 1 tablet by mouth 2 (two) times daily. 05/03/21   [provider]  traMADol (ULTRAM) 50 MG tablet Take 1 tablet (50 mg total)  by mouth every 6 (six) hours as needed for moderate pain or severe pain. 02/10/20   Coralie Keens, MD  zolpidem (AMBIEN) 5 MG tablet Take 2.5 mg by mouth at bedtime as needed for sleep.  03/26/15   [provider]    Allergies    Other, Codeine, Hydrocodone, and Neosporin [neomycin-bacitracin zn-polymyx]  Review of Systems   Review of Systems  Constitutional:  Negative for chills and fever.  HENT:  Negative for ear pain and sore throat.   Eyes:  Negative for pain and visual disturbance.  Respiratory:  Negative for cough and shortness of breath.   Cardiovascular:  Positive for chest pain. Negative for palpitations.  Gastrointestinal:  Negative for abdominal pain and vomiting.  Genitourinary:  Negative for dysuria and hematuria.  Musculoskeletal:  Negative for arthralgias and back pain.  Skin:  Negative for color change and rash.  Neurological:  Negative for seizures and syncope.  All other systems reviewed and are negative.  Physical Exam Updated Vital Signs BP 112/63   Pulse 96   Temp 98.5 F (36.9 C) (Oral)   Resp 19   Ht 5\' 5"  (1.651 m)   Wt 61.2 kg   SpO2 94%   BMI 22.47 kg/m   Physical Exam Vitals and nursing note reviewed.  Constitutional:      General: She is not in acute distress.    Appearance: She is well-developed. She is not ill-appearing.  HENT:      Head: Normocephalic and atraumatic.     Mouth/Throat:     Mouth: Mucous membranes are moist.  Eyes:     Extraocular Movements: Extraocular movements intact.     Conjunctiva/sclera: Conjunctivae normal.     Pupils: Pupils are equal, round, and reactive to light.  Cardiovascular:     Rate and Rhythm: Normal rate and regular rhythm.     Pulses: Normal pulses.          Radial pulses are 2+ on the right side and 2+ on the left side.     Heart sounds: Normal heart sounds. No murmur heard. Pulmonary:     Effort: Pulmonary effort is normal. No respiratory distress.     Breath sounds: Normal breath sounds. No decreased breath sounds, wheezing or rhonchi.  Chest:     Chest wall: No tenderness.  Abdominal:     Palpations: Abdomen is soft.     Tenderness: There is no abdominal tenderness.  Musculoskeletal:     Cervical back: Neck supple.  Skin:    General: Skin is warm and dry.     Capillary Refill: Capillary refill takes less than 2 seconds.  Neurological:     General: No focal deficit present.     Mental Status: She is alert.  Psychiatric:        Mood and Affect: Mood normal.    ED Results / Procedures / Treatments   Labs (all labs ordered are listed, but only abnormal results are displayed) Labs Reviewed  BASIC METABOLIC PANEL - Abnormal; Notable for the following components:      Result Value   Sodium 133 (*)    Chloride 96 (*)    Glucose, Bld 108 (*)    All other components within normal limits  HEPATIC FUNCTION PANEL - Abnormal; Notable for the following components:   AST 12 (*)    All other components within normal limits  CBC  LIPASE, BLOOD  TROPONIN I (HIGH SENSITIVITY)  TROPONIN I (HIGH SENSITIVITY)    EKG EKG  Interpretation  Date/Time:  Monday July 26 2021 10:14:39 EDT Ventricular Rate:  99 PR Interval:  152 QRS Duration: 82 QT Interval:  335 QTC Calculation: 430 R Axis:   77 Text Interpretation: Sinus rhythm Reconfirmed by Lennice Sites ([id number]) on  07/26/2021 10:16:29 AM  Radiology CT Angio Chest PE W and/or Wo Contrast  Result Date: 07/26/2021 CLINICAL DATA:  Developed chest discomfort last night. Shortness of breath, history of breast cancer and lung cancer. EXAM: CT ANGIOGRAPHY CHEST WITH CONTRAST TECHNIQUE: Multidetector CT imaging of the chest was performed using the standard protocol during bolus administration of intravenous contrast. Multiplanar CT image reconstructions and MIPs were obtained to evaluate the vascular anatomy. CONTRAST:  77mL OMNIPAQUE IOHEXOL 350 MG/ML SOLN COMPARISON:  Same day chest radiograph, CT chest 02/01/2021 FINDINGS: Cardiovascular: Satisfactory opacification of the pulmonary arteries to the segmental level. No evidence of pulmonary embolism. The left lower lobe pulmonary artery is occluded shortly after it enters the masslike consolidation in the left lower lobe, similar to the prior study. The heart is at the upper limits of normal for size. There is a small pericardial effusion, also present on the prior study of 02/01/2021. There is aortic atherosclerosis and coronary artery calcification. Mediastinum/Nodes: There is a 1.1 cm subcarinal lymph node, nonspecific and not significantly changed in size. There is no pathologic mediastinal, hilar, or axillary lymphadenopathy. Lungs/Pleura: The trachea and central airways are patent. The left lower lobe bronchus is occluded within a masslike consolidation left lower lobe. The consolidation measures approximately 6.7 cm AP by 3.8 cm TV, overall similar to the prior study. There is no other focal consolidation. There is mosaic attenuation in the lung apices suggestive of air trapping/small airway disease. Pleural thickening along the left lateral chest wall is unchanged. There is a loculated left pleural effusion, similar to the prior study. There is no right pleural effusion. There is no pneumothorax. Upper Abdomen: The imaged portions of the upper abdominal viscera are  unremarkable. Musculoskeletal: There is no acute osseous abnormality. Review of the MIP images confirms the above findings. IMPRESSION: 1. No evidence of acute or chronic pulmonary embolus. 2. Occlusion of the left lower lobe pulmonary artery within the masslike consolidation in the left lower lobe. The left lower lobe bronchus is also included within this consolidation. The appearance is similar to prior studies. 3. Unchanged small pericardial effusion. 4. Mosaic attenuation in the lungs suggests air trapping/small airway disease. 5. Aortic atherosclerosis and coronary artery calcifications, unchanged. Electronically Signed   By: Valetta Mole MD   On: 07/26/2021 12:14   DG Chest Portable 1 View  Result Date: 07/26/2021 CLINICAL DATA:  Chest pain and discomfort beginning yesterday. Shortness of breath. EXAM: PORTABLE CHEST 1 VIEW COMPARISON:  05/28/2020 FINDINGS: Chronic left effusion and volume loss in the left lower lung. No pleural effusion seen on the right. Mild chronic scarring in the right midlung. Very similar appearance to the comparison exam. IMPRESSION: Similar appearance to the study of June. Left effusion with chronic volume loss at the left lower lung. Electronically Signed   By: Nelson Chimes M.D.   On: 07/26/2021 10:24    Procedures Procedures   Medications Ordered in ED Medications  nitroGLYCERIN (NITROSTAT) SL tablet 0.4 mg (has no administration in time range)  iohexol (OMNIPAQUE) 350 MG/ML injection 60 mL (60 mLs Intravenous Contrast Given 07/26/21 1132)    ED Course  I have reviewed the triage vital signs and the nursing notes.  Pertinent labs & imaging results that were  available during my care of the patient were reviewed by me and considered in my medical decision making (see chart for details).    MDM Rules/Calculators/A&P                           [name] French is here with chest pain.  History of lung cancer, breast cancer, tachycardia, high cholesterol.  Started to  experience some chest discomfort overnight last night.  Got better with Tylenol but still has some mild pain and discomfort.  Not sure if it gets worse with movement or not or if it is related to eating.  She had cardiac stress test last year that was unremarkable.  Has no history of CAD and has never had a heart cath.  No history of PE.  Has chronic shortness of breath.  Has chronic left-sided pleural effusion that was seen on chest x-ray.  Does not appear worse.  We will get a CT scan to evaluate for blood clot.  EKG shows sinus rhythm.  First troponin within normal limits.  No significant anemia, electrolyte issue, kidney injury otherwise.  We will get second troponin.  CT scan of chest is overall unremarkable.  No acute findings.  Given her cardiac risk factors and chest discomfort will admit her for further care to medicine.  She did have 1 more episode of chest discomfort while ambulating to the bathroom but has resolved for the most part.  We will give her dose of nitroglycerin if pain continues.  Overall she is hemodynamically stable.  This chart was dictated using voice recognition software.  Despite best efforts to proofread,  errors can occur which can change the documentation meaning.   Final Clinical Impression(s) / ED Diagnoses Final diagnoses:  Chest pain, unspecified type    Rx / DC Orders ED Discharge Orders     None        Lennice Sites, DO 07/26/21 1244

## 2021-07-26 NOTE — ED Triage Notes (Signed)
Pt developed chest discomfort last night, no other symptoms associated with it. Pt is SHOB, haas history of breast cancer and lung cancer which SHOB is frequent.

## 2021-07-26 NOTE — ED Notes (Signed)
hospitalist paged via [place], spoke with phil

## 2021-07-26 NOTE — ED Notes (Signed)
Pt ambulated to bathroom with minimal assistance, gait steady.

## 2021-07-27 DIAGNOSIS — R072 Precordial pain: Secondary | ICD-10-CM

## 2021-07-27 DIAGNOSIS — R000 Tachycardia, unspecified: Secondary | ICD-10-CM | POA: Diagnosis not present

## 2021-07-27 DIAGNOSIS — C3432 Malignant neoplasm of lower lobe, left bronchus or lung: Secondary | ICD-10-CM | POA: Diagnosis not present

## 2021-07-27 DIAGNOSIS — C50211 Malignant neoplasm of upper-inner quadrant of right female breast: Secondary | ICD-10-CM | POA: Diagnosis not present

## 2021-07-27 DIAGNOSIS — R0789 Other chest pain: Secondary | ICD-10-CM | POA: Diagnosis not present

## 2021-07-27 DIAGNOSIS — Z170 Estrogen receptor positive status [ER+]: Secondary | ICD-10-CM | POA: Diagnosis not present

## 2021-07-27 DIAGNOSIS — R079 Chest pain, unspecified: Secondary | ICD-10-CM | POA: Diagnosis not present

## 2021-07-27 MED ORDER — METOPROLOL TARTRATE 25 MG PO TABS: 12.5000 mg | ORAL_TABLET | Freq: Two times a day (BID) | ORAL | 2 refills | Status: DC

## 2021-07-27 NOTE — Discharge Summary (Signed)
Physician Discharge Summary  [name] French YDX:[id number] DOB: 06/22/1926 DOA: 07/26/2021  PCP: Haywood Pao, MD  Admit date: 07/26/2021 Discharge date: 07/27/2021  Time spent: 60 minutes  Recommendations for Outpatient Follow-up:  Follow-up PCP in 2 weeks Follow-up cardiology as outpatient   Discharge Diagnoses:  Principal Problem:   Chest pain Active Problems:   Malignant neoplasm of upper-inner quadrant of right breast in female, estrogen receptor positive ([place])   Primary cancer of left lower lobe of lung ([place])   Sinus tachycardia   Discharge Condition: Stable  Diet recommendation: Heart healthy diet  Filed Weights   07/26/21 1013  Weight: 61.2 kg    History of present illness:  87 year old female with history of hypertension, sinus tachycardia, breast cancer, and is CLC status postradiation.  Not felt to be a candidate for chemotherapy or surgery.  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  She has chronic left lower lobe mass like consolidation collapse seen on prior CTs.  Presented to ED with complaints of chest pain.  Hospital Course:  Chest pain -Resolved -Troponin x2 were 5, 5 respectively -Cardiology was consulted for typical presentation of pressure-like chest pain -Cardiology has signed off, no further intervention recommended  Sinus tachycardia -Patient takes metoprolol 25 mg p.o. twice daily for sinus tachycardia -Blood pressure is soft, we will cut on the dose of metoprolol to 12.5 mg twice daily  Hypertension -Blood pressure is soft -Dose of metoprolol changed to 12.5 mg p.o. twice daily -Follow-up cardiology as outpatient  NSCLC -CT chest was negative for PE -Showed unchanged findings in left lower lobe from previous CT scans -Not a candidate for surgery or chemotherapy  Breast cancer -Followed by oncology as outpatient  Procedures:   Consultations: Cardiology  Discharge Exam: Vitals:    07/27/21 0839 07/27/21 1145  BP: (!) 115/55 (!) 109/53  Pulse: 100 91  Resp: 18 17  Temp: 97.6 F (36.4 C) (!) 97.5 F (36.4 C)  SpO2: 91% 91%    General: Appears in no acute distress Cardiovascular: S1-S2, regular, no murmur auscultated Respiratory: Clear to auscultation bilaterally  Discharge Instructions   Discharge Instructions     Diet - low sodium heart healthy   Complete by: As directed    Increase activity slowly   Complete by: As directed       Allergies as of 07/27/2021       Reactions   Other Anaphylaxis   Lobster- "years ago"   Codeine Itching   Insomnia    Hydrocodone Itching, Other (See Comments)   Insomnia   Neosporin [neomycin-bacitracin Zn-polymyx] Rash        Medication List     STOP taking these medications    sulfamethoxazole-trimethoprim 800-160 MG tablet Commonly known as: BACTRIM DS   zolpidem 5 MG tablet Commonly known as: AMBIEN       TAKE these medications    acetaminophen 500 MG tablet Commonly known as: TYLENOL Take 1,000 mg by mouth every 6 (six) hours as needed for moderate pain or headache.   Align 4 MG Caps Take 4 mg daily by mouth.   aspirin 81 MG tablet Take 81 mg by mouth daily.   BION TEARS OP Place 1-2 drops into both eyes as needed (dry eyes).   cetirizine 10 MG tablet Commonly known as: ZYRTEC Take 10 mg by mouth daily.   doxylamine (Sleep) 25 MG tablet Commonly known as: UNISOM Take 25 mg at bedtime by mouth.  famotidine 20 MG tablet Commonly known as: PEPCID Take 20 mg by mouth daily.   Glucosamine-Chondroit-MSM-C-Mn Caps Take 1 capsule by mouth daily.   metoprolol tartrate 25 MG tablet Commonly known as: LOPRESSOR Take 0.5 tablets (12.5 mg total) by mouth 2 (two) times daily. What changed: how much to take   nitroGLYCERIN 0.4 MG SL tablet Commonly known as: NITROSTAT Place 0.4 mg under the tongue every 5 (five) minutes as needed for chest pain.   pravastatin 40 MG tablet Commonly  known as: PRAVACHOL Take 40 mg daily by mouth.   Super B Complex/FA/Vit C Tabs Take by mouth.   Vitamin D-3 25 MCG (1000 UT) Caps Take 1,000 Units daily by mouth.       Allergies  Allergen Reactions   Other Anaphylaxis    Lobster- "years ago"   Codeine Itching    Insomnia    Hydrocodone Itching and Other (See Comments)    Insomnia   Neosporin [Neomycin-Bacitracin Zn-Polymyx] Rash      The results of significant diagnostics from this hospitalization (including imaging, microbiology, ancillary and laboratory) are listed below for reference.    Significant Diagnostic Studies: CT Angio Chest PE W and/or Wo Contrast  Result Date: 07/26/2021 CLINICAL DATA:  Developed chest discomfort last night. Shortness of breath, history of breast cancer and lung cancer. EXAM: CT ANGIOGRAPHY CHEST WITH CONTRAST TECHNIQUE: Multidetector CT imaging of the chest was performed using the standard protocol during bolus administration of intravenous contrast. Multiplanar CT image reconstructions and MIPs were obtained to evaluate the vascular anatomy. CONTRAST:  20mL OMNIPAQUE IOHEXOL 350 MG/ML SOLN COMPARISON:  Same day chest radiograph, CT chest 02/01/2021 FINDINGS: Cardiovascular: Satisfactory opacification of the pulmonary arteries to the segmental level. No evidence of pulmonary embolism. The left lower lobe pulmonary artery is occluded shortly after it enters the masslike consolidation in the left lower lobe, similar to the prior study. The heart is at the upper limits of normal for size. There is a small pericardial effusion, also present on the prior study of 02/01/2021. There is aortic atherosclerosis and coronary artery calcification. Mediastinum/Nodes: There is a 1.1 cm subcarinal lymph node, nonspecific and not significantly changed in size. There is no pathologic mediastinal, hilar, or axillary lymphadenopathy. Lungs/Pleura: The trachea and central airways are patent. The left lower lobe bronchus is  occluded within a masslike consolidation left lower lobe. The consolidation measures approximately 6.7 cm AP by 3.8 cm TV, overall similar to the prior study. There is no other focal consolidation. There is mosaic attenuation in the lung apices suggestive of air trapping/small airway disease. Pleural thickening along the left lateral chest wall is unchanged. There is a loculated left pleural effusion, similar to the prior study. There is no right pleural effusion. There is no pneumothorax. Upper Abdomen: The imaged portions of the upper abdominal viscera are unremarkable. Musculoskeletal: There is no acute osseous abnormality. Review of the MIP images confirms the above findings. IMPRESSION: 1. No evidence of acute or chronic pulmonary embolus. 2. Occlusion of the left lower lobe pulmonary artery within the masslike consolidation in the left lower lobe. The left lower lobe bronchus is also included within this consolidation. The appearance is similar to prior studies. 3. Unchanged small pericardial effusion. 4. Mosaic attenuation in the lungs suggests air trapping/small airway disease. 5. Aortic atherosclerosis and coronary artery calcifications, unchanged. Electronically Signed   By: Valetta Mole MD   On: 07/26/2021 12:14   DG Chest Portable 1 View  Result Date: 07/26/2021 CLINICAL DATA:  Chest pain and discomfort beginning yesterday. Shortness of breath. EXAM: PORTABLE CHEST 1 VIEW COMPARISON:  05/28/2020 FINDINGS: Chronic left effusion and volume loss in the left lower lung. No pleural effusion seen on the right. Mild chronic scarring in the right midlung. Very similar appearance to the comparison exam. IMPRESSION: Similar appearance to the study of June. Left effusion with chronic volume loss at the left lower lung. Electronically Signed   By: Nelson Chimes M.D.   On: 07/26/2021 10:24    Microbiology: Recent Results (from the past 240 hour(s))  Resp Panel by RT-PCR (Flu A&B, Covid) Nasopharyngeal Swab      Status: None   Collection Time: 07/26/21  6:41 PM   Specimen: Nasopharyngeal Swab; Nasopharyngeal(NP) swabs in vial transport medium  Result Value Ref Range Status   SARS Coronavirus 2 by RT PCR NEGATIVE NEGATIVE Final    Comment: (NOTE) SARS-CoV-2 target nucleic acids are NOT DETECTED.  The SARS-CoV-2 RNA is generally detectable in upper respiratory specimens during the acute phase of infection. The lowest concentration of SARS-CoV-2 viral copies this assay can detect is 138 copies/mL. A negative result does not preclude SARS-Cov-2 infection and should not be used as the sole basis for treatment or other patient management decisions. A negative result may occur with  improper specimen collection/handling, submission of specimen other than nasopharyngeal swab, presence of viral mutation(s) within the areas targeted by this assay, and inadequate number of viral copies(<138 copies/mL). A negative result must be combined with clinical observations, patient history, and epidemiological information. The expected result is Negative.  Fact Sheet for Patients:  EntrepreneurPulse.com.au  Fact Sheet for Healthcare Providers:  IncredibleEmployment.be  This test is no t yet approved or cleared by the [place] FDA and  has been authorized for detection and/or diagnosis of SARS-CoV-2 by FDA under an Emergency Use Authorization (EUA). This EUA will remain  in effect (meaning this test can be used) for the duration of the COVID-19 declaration under Section 564(b)(1) of the Act, 21 U.S.C.section 360bbb-3(b)(1), unless the authorization is terminated  or revoked sooner.       Influenza A by PCR NEGATIVE NEGATIVE Final   Influenza B by PCR NEGATIVE NEGATIVE Final    Comment: (NOTE) The Xpert Xpress SARS-CoV-2/FLU/RSV plus assay is intended as an aid in the diagnosis of influenza from Nasopharyngeal swab specimens and should not be used as a sole basis  for treatment. Nasal washings and aspirates are unacceptable for Xpert Xpress SARS-CoV-2/FLU/RSV testing.  Fact Sheet for Patients: EntrepreneurPulse.com.au  Fact Sheet for Healthcare Providers: IncredibleEmployment.be  This test is not yet approved or cleared by the [place] FDA and has been authorized for detection and/or diagnosis of SARS-CoV-2 by FDA under an Emergency Use Authorization (EUA). This EUA will remain in effect (meaning this test can be used) for the duration of the COVID-19 declaration under Section 564(b)(1) of the Act, 21 U.S.C. section 360bbb-3(b)(1), unless the authorization is terminated or revoked.  Performed at [facility], [address] [place], [place] [address]      Labs: Basic Metabolic Panel: Recent Labs  Lab 07/26/21 1026  NA 133*  K 4.2  CL 96*  CO2 29  GLUCOSE 108*  BUN 16  CREATININE 0.70  CALCIUM 9.4   Liver Function Tests: Recent Labs  Lab 07/26/21 1026  AST 12*  ALT 9  ALKPHOS 53  BILITOT 1.1  PROT 6.9  ALBUMIN 4.2   Recent Labs  Lab 07/26/21 1026  LIPASE 17  No results for input(s): AMMONIA in the last 168 hours. CBC: Recent Labs  Lab 07/26/21 1026  WBC 10.1  HGB 14.0  HCT 42.2  MCV 85.8  PLT 201    Signed:  Oswald Hillock MD.  [facility] 07/27/2021, 3:05 PM

## 2021-07-27 NOTE — Consult Note (Signed)
Cardiology Consultation:   Patient ID: [name] French MRN: [id number]; DOB: Nov 10, 1926  Admit date: 07/26/2021 Date of Consult: 07/27/2021  PCP:  Haywood Pao, MD   [facility] Providers Cardiologist:  [name] Martinique, MD   chest   Patient Profile:   [name] French is a 87 y.o. female with a hx of lung cancer, breast cancer, supraventricular tachycardia, hypertension who is being seen 07/27/2021 for the evaluation of chest discomfort at the request of Dr. Darrick Meigs.  History of Present Illness:   [name] French is a 87 year old, very pleasant female who is typically followed by Dr. Martinique.  She has known left-sided lung cancer as well as breast cancer.  She has a history of coronary artery calcifications.  She was recently seen by [name] Memos, NP for episodes of tachycardia.  Her heart rate was normal when she saw [name] French.  She has been on metoprolol.  She has occasional episodes of left-sided chest pain.  Sunday night she had an episode of upper mid chest pain that lasted for several hours.  The pain was improved with Tylenol. The pain returned the following morning and lasted for several hours.  She was seen by the nurse at [facility] and went to the ER at [facility] at [facility].  She was eventually transferred to the [place].  Her EKG remains on remarkable.  Troponins are negative. Chest CT angiogram was negative for pulmonary embolus.  She has a large left lower lobe pulmonary infiltrate with consolidation of the left lower lobe.  She feels well . No further cp    Past Medical History:  Diagnosis Date   Allergic rhinitis    Anemia    during pregnancy   Arthritis    Breast cancer ([place]) 05/08/13   right upper inner, invasive mammary   Breast cancer, right breast ([place]) 04/16/2013   Underwent lumpectomy on 11/06/13. 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   Use of letrozole ([place])    neoadjuvant antiestrogen therapy with letrozole 2.5 mg daily x 7 monhts    Past Surgical History:  Procedure Laterality Date   BREAST BIOPSY Left 1960   lt br bx/benign   BREAST LUMPECTOMY Right    BREAST LUMPECTOMY Right 02/10/2020   Procedure: RIGHT BREAST LUMPECTOMY;  Surgeon: Coralie Keens, MD;  Location: [place];  Service: General;  Laterality: Right;   BREAST LUMPECTOMY WITH NEEDLE LOCALIZATION Bilateral 11/06/2013   Procedure: BREAST LUMPECTOMY WITH NEEDLE LOCALIZATION;  Surgeon: Haywood Lasso, MD;  Location: [facility];  Service: General;  Laterality: Bilateral;   COLONOSCOPY     EYE SURGERY Bilateral    both cataracts   MOHS SURGERY Right    nose basal/squamous   TOTAL HIP ARTHROPLASTY Left 08/31/2018   Procedure: LEFT TOTAL HIP ARTHROPLASTY ANTERIOR APPROACH;  Surgeon: Mcarthur Rossetti, MD;  Location: [facility];  Service: Orthopedics;  Laterality: Left;  VIDEO BRONCHOSCOPY WITH ENDOBRONCHIAL NAVIGATION N/A 11/01/2017   Procedure: VIDEO BRONCHOSCOPY WITH ENDOBRONCHIAL NAVIGATION;  Surgeon: [name], MD;  Location: [place];  Service: Thoracic;  Laterality: N/A;   WRIST SURGERY  1990   lt     Home Medications:  Prior to Admission medications   Medication Sig Start Date End Date Taking? Authorizing Provider  acetaminophen (TYLENOL) 500 MG tablet Take 1,000 mg by mouth every 6 (six) hours as needed for moderate pain or headache.   Yes [provider]  Artificial Tear Solution (BION TEARS OP) Place 1-2 drops into both eyes as needed (dry eyes).    Yes [provider]  aspirin 81 MG  tablet Take 81 mg by mouth daily.   Yes [provider]  B Complex-C-Folic Acid (SUPER B COMPLEX/FA/VIT C) TABS Take by mouth.   Yes [provider]  cetirizine (ZYRTEC) 10 MG tablet Take 10 mg by mouth daily.   Yes [provider]  Cholecalciferol (VITAMIN D-3) 1000 UNITS CAPS Take 1,000 Units daily by mouth.    Yes [provider]  doxylamine, Sleep, (UNISOM) 25 MG tablet Take 25 mg at bedtime by mouth.   Yes [provider]  famotidine (PEPCID) 20 MG tablet Take 20 mg by mouth daily.   Yes [provider]  Glucosamine-Chondroit-MSM-C-Mn CAPS Take 1 capsule by mouth daily.   Yes [provider]  metoprolol tartrate (LOPRESSOR) 25 MG tablet TAKE ONE TABLET TWICE DAILY Patient taking differently: Take 25 mg by mouth 2 (two) times daily. 03/30/21  Yes French, [name] M, MD  nitroGLYCERIN (NITROSTAT) 0.4 MG SL tablet Place 0.4 mg under the tongue every 5 (five) minutes as needed for chest pain.   Yes [provider]  pravastatin (PRAVACHOL) 40 MG tablet Take 40 mg daily by mouth.  03/31/13  Yes [provider]  Probiotic Product (ALIGN) 4 MG CAPS Take 4 mg daily by mouth.   Yes [provider]  sulfamethoxazole-trimethoprim (BACTRIM DS) 800-160 MG tablet Take 1 tablet by mouth 2 (two) times daily. Patient not taking: Reported on 07/27/2021 05/03/21   [provider]  zolpidem (AMBIEN) 5 MG tablet Take 2.5 mg by mouth at bedtime as needed for sleep.  Patient not taking: Reported on 07/27/2021 03/26/15   [provider]    Inpatient Medications: Scheduled Meds:  aspirin  81 mg Oral Daily   enoxaparin (LOVENOX) injection  40 mg Subcutaneous Q24H   metoprolol tartrate  12.5 mg Oral BID   Continuous Infusions:  PRN Meds: acetaminophen, ondansetron (ZOFRAN) IV, zolpidem  Allergies:    Allergies  Allergen Reactions   Other Anaphylaxis    Lobster- "years ago"   Codeine Itching    Insomnia     Hydrocodone Itching and Other (See Comments)    Insomnia   Neosporin [Neomycin-Bacitracin Zn-Polymyx] Rash    Social History:   Social History   Socioeconomic History   Marital status: Divorced    Spouse name: Not on file   Number of children: 2   Years of education: Not on file   Highest education level: Not on file  Occupational History   Occupation: Retired  Tobacco Use   Smoking status: Former    Packs/day: 0.50    Years: 25.00    Pack years: 12.50    Types: Cigarettes    Quit date: 12/19/1978    Years since quitting: 42.6   Smokeless tobacco: Never  Vaping Use   Vaping Use: Never used  Substance and Sexual Activity   Alcohol use: Yes    Comment: "wine usually, no taste in the last in past few weeks- (02/07/2020)   Drug use: No   Sexual activity: Not Currently    Comment: menarche at age14, menopause in 49, HRT x 86, age at first live birth 3's, G68 P3  Other Topics Concern   Not on file  Social History Narrative   Not on file   Social Determinants of Health   Financial Resource Strain: Not on file  Food Insecurity: Not on file  Transportation Needs: Not on file  Physical Activity: Not on file  Stress: Not on file  Social Connections: Not on file  Intimate Partner Violence: Not on file    Family History:    Family History  Problem Relation Age of Onset   Heart disease Mother    Prostate cancer Father    Lung cancer Sister      ROS:  Please see the history of present illness.   All other ROS reviewed and negative.     Physical Exam/Data:   Vitals:   07/27/21 0055 07/27/21 0616 07/27/21 0839 07/27/21 1145  BP: (!) 91/49 (!) 105/37 (!) 115/55 (!) 109/53  Pulse: 95 98 100 91  Resp: 18 17 18 17   Temp: (!) 97.1 F (36.2 C) 97.6 F (36.4 C) 97.6 F (36.4 C) (!) 97.5 F (36.4 C)  TempSrc: Oral Oral Oral Oral  SpO2: 93% 94% 91% 91%  Weight:      Height:        Intake/Output Summary (Last 24 hours) at 07/27/2021 1418 Last data filed at 07/27/2021  1232 Gross per 24 hour  Intake 360 ml  Output --  Net 360 ml   Last 3 Weights 07/26/2021 06/24/2021 03/15/2021  Weight (lbs) 135 lb 136 lb 6.4 oz 137 lb 4.8 oz  Weight (kg) 61.236 kg 61.871 kg 62.279 kg     Body mass index is 22.47 kg/m.  General:  pleasant , elderly femal e HEENT: normal Lymph: no adenopathy Neck: no JVD Endocrine:  No thryomegaly Vascular: No carotid bruits; FA pulses 2+ bilaterally without bruits  Cardiac:  normal S1, S2; RRR; no murmur  Lungs:  iinspiratory squeek,  reduced breath sound left base  Abd: soft, nontender, no hepatomegaly  Ext: no edema Musculoskeletal:  No deformities, BUE and BLE strength normal and equal Skin: warm and dry  Neuro:  CNs 2-12 intact, no focal abnormalities noted Psych:  Normal affect   EKG:  The EKG was personally reviewed and demonstrates:    NSR at 99.  No ST or T wave changes.   Telemetry:  Telemetry was personally reviewed and demonstrates:  NSr / sinus tach  No ST or T wave changes.    Relevant CV Studies:   Laboratory Data:  High Sensitivity Troponin:   Recent Labs  Lab 07/26/21 1026 07/26/21 1238  TROPONINIHS 5 5     Chemistry Recent Labs  Lab 07/26/21 1026  NA 133*  K 4.2  CL 96*  CO2 29  GLUCOSE 108*  BUN 16  CREATININE 0.70  CALCIUM 9.4  GFRNONAA >60  ANIONGAP 8    Recent Labs  Lab 07/26/21 1026  PROT 6.9  ALBUMIN 4.2  AST 12*  ALT 9  ALKPHOS 53  BILITOT 1.1   Hematology Recent Labs  Lab 07/26/21 1026  WBC 10.1  RBC 4.92  HGB 14.0  HCT 42.2  MCV 85.8  MCH 28.5  MCHC 33.2  RDW 14.7  PLT 201   BNPNo results for input(s): BNP, PROBNP in the last 168 hours.  DDimer No results for input(s): DDIMER in the last 168 hours.   Radiology/Studies:  CT Angio Chest PE W and/or Wo Contrast  Result Date: 07/26/2021 CLINICAL DATA:  Developed chest discomfort last night. Shortness of breath, history of breast cancer and lung cancer. EXAM: CT ANGIOGRAPHY CHEST WITH CONTRAST TECHNIQUE:  Multidetector CT imaging of the chest was performed using the standard protocol during bolus administration of intravenous contrast. Multiplanar CT image reconstructions and MIPs were obtained to evaluate the vascular anatomy. CONTRAST:  15mL OMNIPAQUE IOHEXOL 350 MG/ML SOLN COMPARISON:  Same day chest radiograph, CT chest 02/01/2021 FINDINGS: Cardiovascular: Satisfactory opacification of the pulmonary arteries to the segmental level. No evidence of pulmonary embolism. The left lower lobe pulmonary artery is occluded shortly after it enters the masslike consolidation in the left lower lobe, similar to the prior study. The heart is at the upper limits of normal for size. There is a small pericardial effusion, also present on the prior study of 02/01/2021. There is aortic atherosclerosis and coronary artery calcification. Mediastinum/Nodes: There is a 1.1 cm subcarinal lymph node, nonspecific and not significantly changed in size. There is no pathologic mediastinal, hilar, or axillary lymphadenopathy. Lungs/Pleura: The trachea and central airways are patent. The left lower lobe bronchus is occluded within a masslike consolidation left lower lobe. The consolidation measures approximately 6.7 cm AP by 3.8 cm TV, overall similar to the prior study. There is no other focal consolidation. There is mosaic attenuation in the lung apices suggestive of air trapping/small airway disease. Pleural thickening along the left lateral chest wall is unchanged. There is a loculated left pleural effusion, similar to the prior study. There is no right pleural effusion. There is no pneumothorax. Upper Abdomen: The imaged portions of the upper abdominal viscera are unremarkable. Musculoskeletal: There is no acute osseous abnormality. Review of the MIP images confirms the above findings. IMPRESSION: 1. No evidence of acute or chronic pulmonary embolus. 2. Occlusion of the left lower lobe pulmonary artery within the masslike consolidation in  the left lower lobe. The left lower lobe bronchus is also included within this consolidation. The appearance is similar to prior studies. 3. Unchanged small pericardial effusion. 4. Mosaic attenuation in the lungs suggests air trapping/small airway disease. 5. Aortic atherosclerosis and coronary artery calcifications, unchanged. Electronically Signed   By: Valetta Mole MD   On: 07/26/2021 12:14   DG Chest Portable 1 View  Result Date: 07/26/2021 CLINICAL DATA:  Chest pain and discomfort beginning yesterday. Shortness of breath. EXAM: PORTABLE CHEST 1 VIEW COMPARISON:  05/28/2020 FINDINGS: Chronic left effusion and volume loss in the left lower lung. No pleural effusion seen on the right. Mild chronic scarring in the right midlung. Very similar appearance to the comparison exam. IMPRESSION: Similar appearance to the study of June. Left effusion with chronic volume loss at the left lower lung. Electronically Signed   By: Nelson Chimes M.D.   On: 07/26/2021 10:24     Assessment and Plan:   Chest pain:   atypical .   Work up has been negative.   Negative troponins, ECG shows no changes.   She has a large left lower lobe lung cancer with effusion .   I suspect this could be causing some cp.   Lung CTA was negative for pulmonary embous   She may be discharged today.  She will follow-up with Dr. Martinique or one  of her APP's in the next several weeks.   Risk Assessment/Risk Scores:          [facility] will sign off.   Medication Recommendations:    Other recommendations (labs, testing, etc):    Follow up as an outpatient:     For questions or updates, please contact [place] Please consult [email or web address] for contact info under    Signed, Mertie Moores, MD  07/27/2021 2:18 PM

## 2021-07-27 NOTE — Plan of Care (Signed)
  Problem: Education: Goal: Knowledge of General Education information will improve Description: Including pain rating scale, medication(s)/side effects and non-pharmacologic comfort measures 07/27/2021 1443 by Shelton Silvas, RN Outcome: Adequate for Discharge 07/27/2021 1443 by Shelton Silvas, RN Outcome: Adequate for Discharge   Problem: Health Behavior/Discharge Planning: Goal: Ability to manage health-related needs will improve 07/27/2021 1443 by Shelton Silvas, RN Outcome: Adequate for Discharge 07/27/2021 1443 by Shelton Silvas, RN Outcome: Adequate for Discharge   Problem: Clinical Measurements: Goal: Ability to maintain clinical measurements within normal limits will improve 07/27/2021 1443 by Shelton Silvas, RN Outcome: Adequate for Discharge 07/27/2021 1443 by Shelton Silvas, RN Outcome: Adequate for Discharge Goal: Diagnostic test results will improve 07/27/2021 1443 by Shelton Silvas, RN Outcome: Adequate for Discharge 07/27/2021 1443 by Shelton Silvas, RN Outcome: Adequate for Discharge Goal: Respiratory complications will improve 07/27/2021 1443 by Shelton Silvas, RN Outcome: Adequate for Discharge 07/27/2021 1443 by Shelton Silvas, RN Outcome: Adequate for Discharge Goal: Cardiovascular complication will be avoided 07/27/2021 1443 by Shelton Silvas, RN Outcome: Adequate for Discharge 07/27/2021 1443 by Shelton Silvas, RN Outcome: Adequate for Discharge   Problem: Activity: Goal: Risk for activity intolerance will decrease 07/27/2021 1443 by Shelton Silvas, RN Outcome: Adequate for Discharge 07/27/2021 1443 by Shelton Silvas, RN Outcome: Adequate for Discharge   Problem: Safety: Goal: Ability to remain free from injury will improve 07/27/2021 1443 by Shelton Silvas, RN Outcome: Adequate for Discharge 07/27/2021 1443 by Shelton Silvas, RN Outcome: Adequate for Discharge

## 2021-07-29 ENCOUNTER — Other Ambulatory Visit: Payer: Self-pay | Admitting: Urology

## 2021-07-29 ENCOUNTER — Telehealth: Payer: Self-pay | Admitting: *Deleted

## 2021-07-29 DIAGNOSIS — C3491 Malignant neoplasm of unspecified part of right bronchus or lung: Secondary | ICD-10-CM

## 2021-07-29 DIAGNOSIS — C3432 Malignant neoplasm of lower lobe, left bronchus or lung: Secondary | ICD-10-CM

## 2021-07-29 NOTE — Telephone Encounter (Signed)
CALLED PATIENT TO INFORM THAT SHE DOESN'T NEED TO HAVE SCAN DUE TO SCAN BEING DONE IN July 26 2021,AND HER TELEPHONE FU VISIT WITH ASHLYN BRUNING HAS BEEN CANCELLED PER ASHLYN [place], PATIENT WILL HAVE CT IN 01/2022 AND A TELEPHONE FU TO HAPPEN THERE AFTER, PATIENT VERIFIED UNDERSTANDING ALL THIS INFO.

## 2021-08-11 ENCOUNTER — Ambulatory Visit: Payer: Self-pay | Admitting: Urology

## 2021-08-13 DIAGNOSIS — H524 Presbyopia: Secondary | ICD-10-CM | POA: Diagnosis not present

## 2021-08-13 DIAGNOSIS — H26492 Other secondary cataract, left eye: Secondary | ICD-10-CM | POA: Diagnosis not present

## 2021-08-16 ENCOUNTER — Telehealth: Payer: Self-pay | Admitting: Cardiology

## 2021-08-16 NOTE — Telephone Encounter (Signed)
Spoke with pt and is SOB and this has changed recently with any activity gets SOB if just sitting breathing is normal Pt notes swelling to feet and ankles Per pt may have some abdomen distention  not sure Pt states has very little appetite Pt lives at [facility] and eats very little salt  Pt has appt on 08/19/21 with Coletta Memos NP  Will forward to [place] and Dr Martinique for review .Adonis Housekeeper

## 2021-08-16 NOTE — Telephone Encounter (Signed)
Pt c/o swelling: STAT is pt has developed SOB within 24 hours  How much weight have you gained and in what time span? Pt does not have any scales  If swelling, where is the swelling located?  Stomach, bilateral legs/feet  Are you currently taking a fluid pill? No  Are you currently SOB? Yes  Do you have a log of your daily weights (if so, list)? Pt does not have a scale  Have you gained 3 pounds in a day or 5 pounds in a week?   Have you traveled recently? No Pt c/o Shortness Of Breath: STAT if SOB developed within the last 24 hours or pt is noticeably SOB on the phone  1. Are you currently SOB (can you hear that pt is SOB on the phone)? yes  2. How long have you been experiencing SOB? Extreme SOB 1 week  3. Are you SOB when sitting or when up moving around? Moving around  4. Are you currently experiencing any other symptoms? Bilateral leg swelling

## 2021-08-16 NOTE — Telephone Encounter (Signed)
Agree with plans for evaluation in office  Adelai Achey Martinique MD, [facility]

## 2021-08-17 DIAGNOSIS — J90 Pleural effusion, not elsewhere classified: Secondary | ICD-10-CM | POA: Diagnosis not present

## 2021-08-17 DIAGNOSIS — R600 Localized edema: Secondary | ICD-10-CM | POA: Diagnosis not present

## 2021-08-17 DIAGNOSIS — R000 Tachycardia, unspecified: Secondary | ICD-10-CM | POA: Diagnosis not present

## 2021-08-17 DIAGNOSIS — R0609 Other forms of dyspnea: Secondary | ICD-10-CM | POA: Diagnosis not present

## 2021-08-17 DIAGNOSIS — I700 Atherosclerosis of aorta: Secondary | ICD-10-CM | POA: Diagnosis not present

## 2021-08-17 DIAGNOSIS — C3491 Malignant neoplasm of unspecified part of right bronchus or lung: Secondary | ICD-10-CM | POA: Diagnosis not present

## 2021-08-17 NOTE — Progress Notes (Signed)
Cardiology Clinic Note   Patient Name: [name] French Date of Encounter: 08/19/2021  Primary Care Provider:  Haywood Pao, MD Primary Cardiologist:  Peter Martinique, MD  Patient Profile    [name] French 87 year old female presents to the clinic today for evaluation of her shortness of breath.  Past Medical History    Past Medical History:  Diagnosis Date   Allergic rhinitis    Anemia    during pregnancy   Arthritis    Breast cancer ([place]) 05/08/13   right upper inner, invasive mammary   Breast cancer, right breast ([place]) 04/16/2013   Underwent lumpectomy on 11/06/13. 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   Use of letrozole ([place])    neoadjuvant antiestrogen therapy with letrozole 2.5 mg daily x 7 monhts   Past Surgical History:  Procedure Laterality Date   BREAST BIOPSY Left 1960   lt br bx/benign   BREAST LUMPECTOMY Right    BREAST LUMPECTOMY Right 02/10/2020   Procedure: RIGHT BREAST LUMPECTOMY;  Surgeon: Coralie Keens, MD;  Location: [facility];  Service: General;  Laterality: Right;   BREAST LUMPECTOMY WITH NEEDLE LOCALIZATION Bilateral 11/06/2013   Procedure: BREAST LUMPECTOMY WITH NEEDLE LOCALIZATION;  Surgeon: Haywood Lasso, MD;  Location: [facility];  Service: General;  Laterality: Bilateral;   COLONOSCOPY     EYE SURGERY Bilateral    both cataracts   MOHS SURGERY Right    nose basal/squamous   TOTAL HIP ARTHROPLASTY Left 08/31/2018   Procedure: LEFT TOTAL HIP ARTHROPLASTY ANTERIOR APPROACH;  Surgeon: Mcarthur Rossetti, MD;  Location: [facility];  Service: Orthopedics;  Laterality: Left;   VIDEO BRONCHOSCOPY WITH ENDOBRONCHIAL NAVIGATION N/A 11/01/2017   Procedure: VIDEO BRONCHOSCOPY WITH ENDOBRONCHIAL NAVIGATION;  Surgeon: [name], MD;  Location: [facility];  Service: Thoracic;  Laterality: N/A;   WRIST SURGERY  1990   lt    Allergies  Allergies  Allergen Reactions   Other Anaphylaxis    Lobster- "years ago"   Codeine Itching    Insomnia    Hydrocodone Itching and Other (See Comments)    Insomnia   Neosporin [Neomycin-Bacitracin Zn-Polymyx] Rash    History of Present Illness    [name] French has a PMH of secondary hypertension, coronary artery calcification, and tachycardia.  Her PMH also includes lung cancer with radiation therapy.  She was not felt to be a candidate for chemotherapy or surgery.  She was admitted overnight 12/25/2017 for evaluation of atypical chest pain.  Her CT chest was negative for PE.  She was noted to have left pleural effusion.  Her troponins and EKG were unremarkable.  Her echocardiogram was normal.  She was noted to have PACs and there was mention of a run of NSVT.  She was started on metoprolol.  Her chest pain was felt to be esophageal in nature.  She was seen by  GI 01/25/2018 for evaluation of abnormal colon finding on her PET scan.  Follow-up evaluation was never completed.  It was noted that her heart rate was elevated to 150 bpm.  She had stopped taking her metoprolol 2 days prior to her evaluation.  No EKG was ordered at that time.   He was started back on her metoprolol 12.5 mg twice daily.  Initially she did well without side effects.  She did eventually report feeling very fatigued.  Attempts to taper Lyrica metoprolol resulted in increased heart rates.  She switched to 12.5 mg daily and she reported heart rates in the 130s in the a.m.'s.  She underwent nuclear stress test 4/19 for chest pain.  Her exam was unremarkable.  Subsequent CT scans showed  left lung collapse with a large left effusion.   She was admitted to the hospital 6/21 with respiratory failure and severe hyponatremia.  Her hyponatremia improved with tolvaptan.  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  She is placed on fluid restriction.  She was noted to have bilateral pleural effusions.  She underwent thoracentesis which produced transudative fluid.  An echocardiogram at that time showed moderate pulmonary hypertension, normal LV function, normal RV function.  She was transferred to inpatient rehab for 2 weeks after.   She was seen by Dr. Martinique on 07/14/2020.  During that time she reported that her breathing was okay.  She felt tense.  She reported not feeling like herself and anxious.  She denied chest pain or palpitations.   She presented to the clinic 06/24/21 for follow-up evaluation stated she continued to have shortness of breath with increased physical activity.  She was doing water aerobics 3 times a week for 45 minutes and continued to walk with a fair distance to her activities and the dining hall.  She was not receiving cancer treatment but continued to follow with oncology for her lung cancer and breast cancer.  She reported that she had daily phlegm in the morning that dissipated throughout the day.  She did have 1 episode of chest discomfort several months ago that dissipated without intervention.  She reported "she almost forgot about the episode. "  She had not had any recurrent chest discomfort and did not notice chest discomfort with increased physical activity.  We discussed the importance of deep breathing and coughing.  I encouraged  continued physical activity,  deep breathing and coughing 2-3 times per day, and follow-up was planned for 12 months.  She presented to the emergency department on 07/26/2021 and was discharged on 07/27/2021 with complaints of chest discomfort.  Her troponins were 5 and 5, her chest pain resolved, her EKG showed sinus  rhythm 99 bpm.  Cardiology signed off without further plans for ischemic evaluation she was discharged in stable condition.  She contacted the nurse triage line on 08/16/2021 and reported lower extremity swelling and increased shortness of breath.  She presents the clinic today for evaluation and states she has noticed increased work of breathing and lower extremity swelling x1 month.  We reviewed her recent lab work and diagnostics.  We reviewed the process of/pathophysiology lower extremity edema.  She was at her PCP yesterday and started on furosemide 40 mg daily.  I will continue her furosemide for another 4 days and at supplemental potassium.  We will repeat her BMP on Tuesday and review her weights.  She was instructed to bring her weight log on that day.  I will have  her follow-up in 2 weeks at the [facility] location.  I have encouraged her to meet a low-sodium diet, elevate her lower extremities when not active, and weigh herself daily.  She presents to her visit today with her daughter.  We will repeat her echocardiogram.   Today she denies chest pain, shortness of breath, lower extremity edema, fatigue, palpitations, melena, hematuria, hemoptysis, diaphoresis, weakness, presyncope, syncope, orthopnea, and PND.  Home Medications    Prior to Admission medications   Medication Sig Start Date End Date Taking? Authorizing Provider  acetaminophen (TYLENOL) 500 MG tablet Take 1,000 mg by mouth every 6 (six) hours as needed for moderate pain or headache.    [provider]  Artificial Tear Solution (BION TEARS OP) Place 1-2 drops into both eyes as needed (dry eyes).     [provider]  aspirin 81 MG tablet Take 81 mg by mouth daily.    [provider]  B Complex-C-Folic Acid (SUPER B COMPLEX/FA/VIT C) TABS Take by mouth.    [provider]  cetirizine (ZYRTEC) 10 MG tablet Take 10 mg by mouth daily.    [provider]  Cholecalciferol (VITAMIN D-3)  1000 UNITS CAPS Take 1,000 Units daily by mouth.     [provider]  doxylamine, Sleep, (UNISOM) 25 MG tablet Take 25 mg at bedtime by mouth.    [provider]  famotidine (PEPCID) 20 MG tablet Take 20 mg by mouth daily.    [provider]  Glucosamine-Chondroit-MSM-C-Mn CAPS Take 1 capsule by mouth daily.    [provider]  metoprolol tartrate (LOPRESSOR) 25 MG tablet Take 0.5 tablets (12.5 mg total) by mouth 2 (two) times daily. 07/27/21   Oswald Hillock, MD  nitroGLYCERIN (NITROSTAT) 0.4 MG SL tablet Place 0.4 mg under the tongue every 5 (five) minutes as needed for chest pain.    [provider]  pravastatin (PRAVACHOL) 40 MG tablet Take 40 mg daily by mouth.  03/31/13   [provider]  Probiotic Product (ALIGN) 4 MG CAPS Take 4 mg daily by mouth.    [provider]    Family History    Family History  Problem Relation Age of Onset   Heart disease Mother    Prostate cancer Father    Lung cancer Sister    She indicated that her mother is deceased. She indicated that her father is deceased. She indicated that one of her two sisters is deceased. She indicated that her maternal grandmother is deceased. She indicated that her maternal grandfather is deceased. She indicated that her paternal grandmother is deceased. She indicated that her paternal grandfather is deceased.  Social History    Social History   Socioeconomic History   Marital status: Divorced    Spouse name: Not on file   Number of children: 2   Years of education: Not on file   Highest education level: Not on file  Occupational History   Occupation: Retired  Tobacco Use   Smoking status: Former    Packs/day: 0.50    Years: 25.00    Pack years: 12.50    Types: Cigarettes    Quit date: 12/19/1978    Years since quitting: 42.6   Smokeless tobacco: Never  Vaping Use   Vaping Use: Never used  Substance and Sexual Activity   Alcohol use: Yes    Comment:  "wine usually, no taste in the last in past few weeks- (02/07/2020)   Drug use: No   Sexual  activity: Not Currently    Comment: menarche at age14, menopause in 89, HRT x 3, age at first live birth 85's, G53 P3  Other Topics Concern   Not on file  Social History Narrative   Not on file   Social Determinants of Health   Financial Resource Strain: Not on file  Food Insecurity: Not on file  Transportation Needs: Not on file  Physical Activity: Not on file  Stress: Not on file  Social Connections: Not on file  Intimate Partner Violence: Not on file     Review of Systems    General:  No chills, fever, night sweats or weight changes.  Cardiovascular:  No chest pain, dyspnea on exertion, edema, orthopnea, palpitations, paroxysmal nocturnal dyspnea. Dermatological: No rash, lesions/masses Respiratory: No cough, dyspnea Urologic: No hematuria, dysuria Abdominal:   No nausea, vomiting, diarrhea, bright red blood per rectum, melena, or hematemesis Neurologic:  No visual changes, wkns, changes in mental status. All other systems reviewed and are otherwise negative except as noted above.  Physical Exam    VS:  BP 132/60 (BP Location: Right Arm, Patient Position: Sitting, Cuff Size: Normal)   Pulse 80   Ht 5\' 5"  (1.651 m)   Wt 146 lb 12.8 oz (66.6 kg)   SpO2 99%   BMI 24.43 kg/m  , BMI Body mass index is 24.43 kg/m. GEN: Well nourished, well developed, in no acute distress. HEENT: normal. Neck: Supple, no JVD, carotid bruits, or masses. Cardiac: Tachycardic, no murmurs, rubs, or gallops. No clubbing, cyanosis, edema.  Radials/DP/PT 2+ and equal bilaterally.  Respiratory:  Respirations regular and unlabored, clear to auscultation bilaterally. GI: Soft, nontender, nondistended, BS + x 4. MS: no deformity or atrophy. Skin: warm and dry, no rash. Neuro:  Strength and sensation are intact. Psych: Normal affect.  Accessory Clinical Findings    Recent Labs: 07/26/2021: ALT 9; BUN 16;  Creatinine, Ser 0.70; Hemoglobin 14.0; Platelets 201; Potassium 4.2; Sodium 133   Recent Lipid Panel    Component Value Date/Time   CHOL 158 12/26/2017 0025   TRIG 167 (H) 12/26/2017 0025   HDL 51 12/26/2017 0025   CHOLHDL 3.1 12/26/2017 0025   VLDL 33 12/26/2017 0025   LDLCALC 74 12/26/2017 0025    ECG personally reviewed by me today-sinus tachycardia with premature ventricular complexes nonspecific T wave abnormality 115 bpm- No acute changes  EKG 06/24/2021 -normal sinus rhythm no ST or T wave deviation 88 bpm   Nuclear stress test 03/23/2018 Nuclear stress EF: 56%. Clinically and electrically negative for ischemia Normal perfusiion No ischemia or scar This is a low risk study.       Echocardiogram 05/29/2020 IMPRESSIONS     1. Left ventricular ejection fraction, by estimation, is 60 to 65%. The  left ventricle has normal function. The left ventricle has no regional  wall motion abnormalities. Left ventricular diastolic parameters are  consistent with Grade II diastolic  dysfunction (pseudonormalization).   2. Right ventricular systolic function is normal. The right ventricular  size is mildly enlarged. There is moderately elevated pulmonary artery  systolic pressure. The estimated right ventricular systolic pressure is  32.6 mmHg.   3. Right atrial size was moderately dilated.   4. The mitral valve is normal in structure. Mild mitral valve  regurgitation. No evidence of mitral stenosis.   5. Tricuspid valve regurgitation is moderate.   6. The aortic valve is normal in structure. Aortic valve regurgitation is  mild. No aortic stenosis is present.  7. The inferior vena cava is normal in size with greater than 50%  respiratory variability, suggesting right atrial pressure of 3 mmHg.  Assessment & Plan   1.  Lower extremity edema/shortness of breath-generalized bilateral lower extremity nonpitting edema.  History of lung CA.  History of chronic left pleural  effusion. Continue furosemide x4 days and stop  Order 20 mEq of potassium x4 days then stop  Daily weights Elevate lower extremities when not active Increase physical activity as tolerated Heart healthy low-sodium diet Order BMP in 1 week  Essential hypertension-BP today 132/60.  Well-controlled at home. Continue metoprolol Heart healthy low-sodium diet-salty 6 given Increase physical activity as tolerated    Sinus tachycardia-heart rate today 80 bpm.  No recent episodes of increased or irregular heartbeats. Continue increase metoprolol to 25 mg a.m. and continue 12.5 mg in the p.m. Heart healthy low-sodium diet-salty 6 given Increase physical activity as tolerated  Coronary calcium-no chest pain today.  .  Nuclear stress test showed normal perfusion. Continue aspirin, metoprolol, nitroglycerin, pravastatin Heart healthy low-sodium diet-salty 6 given Increase physical activity as tolerated   Lung cancer-status postradiation therapy.  Follow-up CT showed stable findings 7/20.  Noted to have chronic left pleural effusion has done well since bilateral thoracentesis.   Breast cancer-completed chemotherapy.  There is a recurrent breast CA on the right. Follows with oncology     Disposition: Follow-up with Dr. Martinique or me in 2 weeks.  Jossie Ng. Henri Guedes NP-C    08/19/2021, 3:10 PM [facility] [address] Office [phone] Fax [phone]  Notice: This dictation was prepared with Dragon dictation along with smaller phrase technology. Any transcriptional errors that result from this process are unintentional and may not be corrected upon review.  I spent 15 minutes examining this patient, reviewing medications, and using patient centered shared decision making involving her cardiac care.  Prior to her visit I spent greater than 20 minutes reviewing her past medical history,  medications, and prior cardiac tests.

## 2021-08-18 ENCOUNTER — Telehealth: Payer: Self-pay | Admitting: *Deleted

## 2021-08-18 NOTE — Telephone Encounter (Signed)
[name] French states she has been having some shortness of breath. Went to see NP yesterday who prescribed lasix. Wants Dr Lindi Adie to review her CT from 8/8 and let her know if there was a "significant change", was concerned about going to IR as suggested by NP. "Should I take the lasix she prescribed?" Has an appt with Cardiologist tomorrow.  Dr Lindi Adie states there was no significant change on scan. Take lasix as prescribed by NP and see cardiologist tomorrow as scheduled.

## 2021-08-19 ENCOUNTER — Other Ambulatory Visit: Payer: Self-pay

## 2021-08-19 ENCOUNTER — Encounter: Payer: Self-pay | Admitting: General Practice

## 2021-08-19 ENCOUNTER — Ambulatory Visit (INDEPENDENT_AMBULATORY_CARE_PROVIDER_SITE_OTHER): Payer: Medicare Other | Admitting: General Practice

## 2021-08-19 VITALS — BP 132/60 | HR 80 | Ht 65.0 in | Wt 146.8 lb

## 2021-08-19 DIAGNOSIS — I2510 Atherosclerotic heart disease of native coronary artery without angina pectoris: Secondary | ICD-10-CM | POA: Diagnosis not present

## 2021-08-19 DIAGNOSIS — R600 Localized edema: Secondary | ICD-10-CM

## 2021-08-19 DIAGNOSIS — Z79899 Other long term (current) drug therapy: Secondary | ICD-10-CM | POA: Diagnosis not present

## 2021-08-19 DIAGNOSIS — I10 Essential (primary) hypertension: Secondary | ICD-10-CM | POA: Diagnosis not present

## 2021-08-19 DIAGNOSIS — C3490 Malignant neoplasm of unspecified part of unspecified bronchus or lung: Secondary | ICD-10-CM | POA: Diagnosis not present

## 2021-08-19 DIAGNOSIS — I2584 Coronary atherosclerosis due to calcified coronary lesion: Secondary | ICD-10-CM | POA: Diagnosis not present

## 2021-08-19 DIAGNOSIS — R000 Tachycardia, unspecified: Secondary | ICD-10-CM

## 2021-08-19 MED ORDER — FUROSEMIDE 40 MG PO TABS: 40.0000 mg | ORAL_TABLET | Freq: Every day | ORAL | 3 refills | Status: DC

## 2021-08-19 MED ORDER — POTASSIUM CHLORIDE CRYS ER 20 MEQ PO TBCR: 20.0000 meq | EXTENDED_RELEASE_TABLET | Freq: Every day | ORAL | 0 refills | Status: DC

## 2021-08-19 NOTE — Patient Instructions (Addendum)
Medication Instructions:  TAKE FUROSEMIDE 40MG  x4 DAYS THEN STOP  POTASSIUM 20 MEQx4 DAYS THEN STOP  TAKE METOPROLOL 25MG  AM THEN 12.5MG  IN THE PM(1/2TAB)  *If you need a refill on your cardiac medications before your next appointment, please call your pharmacy*  Lab Work: BMET ON Tuesday-THIS IS NOT FASTING If you have labs (blood work) drawn today and your tests are completely normal, you will receive your results only by:  [place] (if you have MyChart) OR A paper copy in the mail.  If you have any lab test that is abnormal or we need to change your treatment, we will call you to review the results. You may go to any [facility] that is convenient for you however, we do have a lab in our office that is able to assist you. You DO NOT need an appointment for our lab. The lab is open 8:00am and closes at 4:00pm. Lunch 12:45 - 1:45pm.  Testing/Procedures: Echocardiogram - Your physician has requested that you have an echocardiogram. Echocardiography is a painless test that uses sound waves to create images of your heart. It provides your doctor with information about the size and shape of your heart and how well your heart's chambers and valves are working. This procedure takes approximately one hour. There are no restrictions for this procedure. This will be performed at our [facility] location - [address].   Special Instructions TAKE AND LOG YOUR WEIGHT DAILY-BRING WITH YOU Tuesday WHEN YOU COME BACK FOR YOUR LABWORK  Follow-Up: Your next appointment:  09-01-21 @ 9:15AM THIS WILL BE AT [facility].   [address], [place], [place] [address]; Phone: [phone]  In Person with JESSE CLEAVER, FNP-C   At [facility], you and your health needs are our priority.  As part of our continuing mission to provide you with exceptional heart care, we have created designated Provider Care Teams.  These Care Teams include your primary Cardiologist  (physician) and Advanced Practice Providers (APPs -  Physician Assistants and Nurse Practitioners) who all work together to provide you with the care you need, when you need it.

## 2021-08-24 DIAGNOSIS — R600 Localized edema: Secondary | ICD-10-CM | POA: Diagnosis not present

## 2021-08-24 DIAGNOSIS — I2510 Atherosclerotic heart disease of native coronary artery without angina pectoris: Secondary | ICD-10-CM | POA: Diagnosis not present

## 2021-08-24 DIAGNOSIS — I2584 Coronary atherosclerosis due to calcified coronary lesion: Secondary | ICD-10-CM | POA: Diagnosis not present

## 2021-08-24 DIAGNOSIS — R000 Tachycardia, unspecified: Secondary | ICD-10-CM | POA: Diagnosis not present

## 2021-08-24 DIAGNOSIS — I10 Essential (primary) hypertension: Secondary | ICD-10-CM | POA: Diagnosis not present

## 2021-08-24 DIAGNOSIS — Z79899 Other long term (current) drug therapy: Secondary | ICD-10-CM | POA: Diagnosis not present

## 2021-08-24 DIAGNOSIS — C3490 Malignant neoplasm of unspecified part of unspecified bronchus or lung: Secondary | ICD-10-CM | POA: Diagnosis not present

## 2021-08-25 ENCOUNTER — Encounter: Payer: Self-pay | Admitting: Hematology and Oncology

## 2021-08-25 LAB — BASIC METABOLIC PANEL
BUN/Creatinine Ratio: 14 (ref 12–28)
BUN: 12 mg/dL (ref 10–36)
CO2: 29 mmol/L (ref 20–29)
Calcium: 9.6 mg/dL (ref 8.7–10.3)
Chloride: 95 mmol/L — ABNORMAL LOW (ref 96–106)
Creatinine, Ser: 0.86 mg/dL (ref 0.57–1.00)
Glucose: 79 mg/dL (ref 65–99)
Potassium: 4.9 mmol/L (ref 3.5–5.2)
Sodium: 137 mmol/L (ref 134–144)
eGFR: 63 mL/min/{1.73_m2} (ref >59)

## 2021-08-31 NOTE — Progress Notes (Signed)
Cardiology Office Note:    Date:  08/31/2021   ID:  JAIMEE French, DOB 28-Mar-1926, MRN [id number]  PCP:  Haywood Pao, MD   [facility] Providers Cardiologist:  Peter Martinique, MD      Referring MD: Haywood Pao, MD   Follow-up for lower extremity edema    History of Present Illness:    [name] French is a 87 y.o. female with a hx of secondary hypertension, coronary artery calcification, and tachycardia.  Her PMH also includes lung cancer with radiation therapy.  She was not felt to be a candidate for chemotherapy or surgery.  She was admitted overnight 12/25/2017 for evaluation of atypical chest pain.  Her CT chest was negative for PE.  She was noted to have left pleural effusion.  Her troponins and EKG were unremarkable.  Her echocardiogram was normal.  She was noted to have PACs and there was mention of a run of NSVT.  She was started on metoprolol.  Her chest pain was felt to be esophageal in nature.  She was seen by GI 01/25/2018 for evaluation of abnormal colon finding on her PET scan.  Follow-up evaluation was never completed.  It was noted that her heart rate was elevated to 150 bpm.  She had stopped taking her metoprolol 2 days prior to her evaluation.  No EKG was ordered at that time.   He was started back on her metoprolol 12.5 mg twice daily.  Initially she did well without side effects.  She did eventually report feeling very fatigued.  Attempts to taper Lyrica metoprolol resulted in increased heart rates.  She switched to 12.5 mg daily and she reported heart rates in the 130s in the a.m.'s.  She underwent nuclear stress test 4/19 for chest pain.  Her exam was unremarkable.  Subsequent CT scans showed left lung collapse with a large left effusion.   She was admitted to the hospital 6/21 with respiratory failure and severe hyponatremia.  Her hyponatremia improved with tolvaptan.  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  She is placed on fluid  restriction.  She was noted to have bilateral pleural effusions.  She underwent thoracentesis which produced transudative fluid.  An echocardiogram at that time showed moderate pulmonary hypertension, normal LV function, normal RV function.  She was transferred to inpatient rehab for 2 weeks after.   She was seen by Dr. Martinique on 07/14/2020.  During that time she reported that her breathing was okay.  She felt tense.  She reported not feeling like herself and anxious.  She denied chest pain or palpitations.   She presented to the clinic 06/24/21 for follow-up evaluation stated she continued to have shortness of breath with increased physical activity.  She was doing water aerobics 3 times a week for 45 minutes and continued to walk with a fair distance to her activities and the dining hall.  She was not receiving cancer treatment but continued to follow with oncology for her lung cancer and breast cancer.  She reported that she had daily phlegm in the morning that dissipated throughout the day.  She did have 1 episode of chest discomfort several months ago that dissipated without intervention.  She reported "she almost forgot about the episode. "  She had not had any recurrent chest discomfort and did not notice chest discomfort with increased physical activity.  We discussed the importance of deep breathing and coughing.  I encouraged  continued physical  activity,  deep breathing and coughing 2-3 times per day, and follow-up was planned for 12 months.   She presented to the emergency department on 07/26/2021 and was discharged on 07/27/2021 with complaints of chest discomfort.  Her troponins were 5 and 5, her chest pain resolved, her EKG showed sinus rhythm 99 bpm.  Cardiology signed off without further plans for ischemic evaluation she was discharged in stable condition.   She contacted the nurse triage line on 08/16/2021 and reported lower extremity swelling and increased shortness of breath.   She presented to  the clinic 08/19/21 for evaluation and stated she had noticed increased work of breathing and lower extremity swelling x1 month.  We reviewed her recent lab work and diagnostics.  We reviewed the process of/pathophysiology lower extremity edema.  She was at her PCP the previous day and started on furosemide 40 mg daily.  I will continued her furosemide for another 4 days and atdded supplemental potassium.  We planned repeat  BMP  and review of her weights.  She was instructed to bring her weight log on that day.  I planned follow-up in 2 weeks at the [facility] location.  I encouraged her to meet a low-sodium diet, elevate her lower extremities when not active, and weigh herself daily.  She presented to her visit today with her daughter.  We will repeat her echocardiogram.  Her lab work remained stable on 08/24/2021 and her weight log showed significant improvement.  She presents to the clinic today for follow-up evaluation states she is feeling somewhat better today.  Weight is down about 9 to 10 pounds.  We reviewed the importance of daily weights and low-sodium diet.  She expressed understanding.  She reports that she is not having as much trouble with her breathing with increased physical activity after her bout of Lasix.  She is ready to get back into her water aerobics.  Her echocardiogram has been scheduled.  I will give her the salty 6 diet sheet, have her continue to weigh her self daily and keep a weight log, have encouraged her to increase her physical activity as tolerated we will have her follow-up in 3 to 4 months.   Today she denies chest pain, shortness of breath, lower extremity edema, fatigue, palpitations, melena, hematuria, hemoptysis, diaphoresis, weakness, presyncope, syncope, orthopnea, and PND.  Past Medical History:  Diagnosis Date   Allergic rhinitis    Anemia    during pregnancy   Arthritis    Breast cancer ([place]) 05/08/13   right upper inner, invasive mammary   Breast cancer,  right breast ([place]) 04/16/2013   Underwent lumpectomy on 11/06/13. 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   Use of letrozole ([place])    neoadjuvant antiestrogen therapy with letrozole 2.5 mg daily x 7 monhts    Past Surgical History:  Procedure Laterality Date   BREAST BIOPSY Left 1960  lt br bx/benign   BREAST LUMPECTOMY Right    BREAST LUMPECTOMY Right 02/10/2020   Procedure: RIGHT BREAST LUMPECTOMY;  Surgeon: Coralie Keens, MD;  Location: [place];  Service: General;  Laterality: Right;   BREAST LUMPECTOMY WITH NEEDLE LOCALIZATION Bilateral 11/06/2013   Procedure: BREAST LUMPECTOMY WITH NEEDLE LOCALIZATION;  Surgeon: Haywood Lasso, MD;  Location: [place];  Service: General;  Laterality: Bilateral;   COLONOSCOPY     EYE SURGERY Bilateral    both cataracts   MOHS SURGERY Right    nose basal/squamous   TOTAL HIP ARTHROPLASTY Left 08/31/2018   Procedure: LEFT TOTAL HIP ARTHROPLASTY ANTERIOR APPROACH;  Surgeon: Mcarthur Rossetti, MD;  Location: [facility];  Service: Orthopedics;  Laterality: Left;   VIDEO BRONCHOSCOPY WITH ENDOBRONCHIAL NAVIGATION N/A 11/01/2017   Procedure: VIDEO BRONCHOSCOPY WITH ENDOBRONCHIAL NAVIGATION;  Surgeon: [name], MD;  Location: [place];  Service: Thoracic;  Laterality: N/A;   WRIST SURGERY  1990   lt    Current Medications: No outpatient medications have been marked as taking for the 09/01/21 encounter (Appointment) with Deberah Pelton, NP.     Allergies:   Other, Codeine, Hydrocodone, and  Neosporin [neomycin-bacitracin zn-polymyx]   Social History   Socioeconomic History   Marital status: Divorced    Spouse name: Not on file   Number of children: 2   Years of education: Not on file   Highest education level: Not on file  Occupational History   Occupation: Retired  Tobacco Use   Smoking status: Former    Packs/day: 0.50    Years: 25.00    Pack years: 12.50    Types: Cigarettes    Quit date: 12/19/1978    Years since quitting: 42.7   Smokeless tobacco: Never  Vaping Use   Vaping Use: Never used  Substance and Sexual Activity   Alcohol use: Yes    Comment: "wine usually, no taste in the last in past few weeks- (02/07/2020)   Drug use: No   Sexual activity: Not Currently    Comment: menarche at age14, menopause in 64, HRT x 9, age at first live birth 63's, G20 P3  Other Topics Concern   Not on file  Social History Narrative   Not on file   Social Determinants of Health   Financial Resource Strain: Not on file  Food Insecurity: Not on file  Transportation Needs: Not on file  Physical Activity: Not on file  Stress: Not on file  Social Connections: Not on file     Family History: The patient's family history includes Heart disease in her mother; Lung cancer in her sister; Prostate cancer in her father.  ROS:   Please see the history of present illness.     All other systems reviewed and are negative.   Risk Assessment/Calculations:           Physical Exam:    VS:  There were no vitals taken for this visit.    Wt Readings from Last 3 Encounters:  08/19/21 146 lb 12.8 oz (66.6 kg)  07/26/21 135 lb (61.2 kg)  06/24/21 136 lb 6.4 oz (61.9 kg)     GEN:  Well nourished, well developed in no acute distress HEENT: Normal NECK: No JVD; No carotid bruits LYMPHATICS: No lymphadenopathy CARDIAC: Tachycardic, no murmurs, rubs, gallops RESPIRATORY:  Clear to auscultation without rales, wheezing or rhonchi  ABDOMEN: Soft, non-tender,  non-distended MUSCULOSKELETAL:  No edema; No deformity  SKIN: Warm and dry NEUROLOGIC:  Alert and oriented x 3 PSYCHIATRIC:  Normal affect    EKGs/Labs/Other Studies Reviewed:    The following studies were reviewed today: Nuclear stress test 03/23/2018 Nuclear stress EF: 56%. Clinically and electrically negative for ischemia Normal perfusiion No ischemia or scar This is a low risk study.       Echocardiogram 05/29/2020 IMPRESSIONS     1. Left ventricular ejection fraction, by estimation, is 60 to 65%. The  left ventricle has normal function. The left ventricle has no regional  wall motion abnormalities. Left ventricular diastolic parameters are  consistent with Grade II diastolic  dysfunction (pseudonormalization).   2. Right ventricular systolic function is normal. The right ventricular  size is mildly enlarged. There is moderately elevated pulmonary artery  systolic pressure. The estimated right ventricular systolic pressure is  36.1 mmHg.   3. Right atrial size was moderately dilated.   4. The mitral valve is normal in structure. Mild mitral valve  regurgitation. No evidence of mitral stenosis.   5. Tricuspid valve regurgitation is moderate.   6. The aortic valve is normal in structure. Aortic valve regurgitation is  mild. No aortic stenosis is present.   7. The inferior vena cava is normal in size with greater than 50%  respiratory variability, suggesting right atrial pressure of 3 mmHg.  EKG: None today.  EKG 08/19/2021 sinus tachycardia with premature ventricular complexes nonspecific T wave abnormality 115 bpm- No acute changes   EKG 06/24/2021 -normal sinus rhythm no ST or T wave deviation 88 bpm  Recent Labs: 07/26/2021: ALT 9; Hemoglobin 14.0; Platelets 201 08/24/2021: BUN 12; Creatinine, Ser 0.86; Potassium 4.9; Sodium 137  Recent Lipid Panel    Component Value Date/Time   CHOL 158 12/26/2017 0025   TRIG 167 (H) 12/26/2017 0025   HDL 51 12/26/2017 0025    CHOLHDL 3.1 12/26/2017 0025   VLDL 33 12/26/2017 0025   LDLCALC 74 12/26/2017 0025    ASSESSMENT & PLAN    Lower extremity edema/shortness of breath-euvolemic today.  History of lung CA.  History of chronic left pleural effusion. Continue furosemide as needed for lower extremity edema Daily weights Elevate lower extremities when not active Increase physical activity as tolerated Heart healthy low-sodium diet Echocardiogram as scheduled.   Essential hypertension-BP today 126/72.  Well-controlled at home. Continue metoprolol Heart healthy low-sodium diet-salty 6 given Increase physical activity as tolerated     Sinus tachycardia-heart rate today 113 bpm.  Took her 25 mg of metoprolol just prior to her appointment today.  Denies episodes of  increased or irregular heartbeats. Continue metoprolol to 25 mg a.m. and continue 12.5 mg in the p.m. Heart healthy low-sodium diet-salty 6 given Increase physical activity as tolerated   Coronary calcium-no recent episodes of arm neck back or chest discomfort. Nuclear stress test showed normal perfusion. Continue aspirin, metoprolol, nitroglycerin, pravastatin Heart healthy low-sodium diet-salty 6 given Increase physical activity as tolerated   Lung cancer-status postradiation therapy.  Follow-up CT showed stable findings 7/20.  Noted to have chronic left pleural effusion has done well since bilateral thoracentesis.   Breast cancer-completed chemotherapy.  There is a recurrent breast CA on the right. Follows with oncology       Disposition: Follow-up with Dr. Martinique 3-4 months       Medication Adjustments/Labs and Tests Ordered: Current medicines are reviewed at length with the patient today.  Concerns regarding medicines are outlined above.  No orders of the defined types were placed in this encounter.  No orders of  the defined types were placed in this encounter.   There are no Patient Instructions on file for this visit.    Signed, Deberah Pelton, NP  08/31/2021 12:14 PM      Notice: This dictation was prepared with Dragon dictation along with smaller phrase technology. Any transcriptional errors that result from this process are unintentional and may not be corrected upon review.  I spent 13 minutes examining this patient, reviewing medications, and using patient centered shared decision making involving her cardiac care.  Prior to her visit I spent greater than 20 minutes reviewing her past medical history,  medications, and prior cardiac tests.

## 2021-09-01 ENCOUNTER — Encounter (HOSPITAL_BASED_OUTPATIENT_CLINIC_OR_DEPARTMENT_OTHER): Payer: Self-pay | Admitting: General Practice

## 2021-09-01 ENCOUNTER — Other Ambulatory Visit: Payer: Self-pay

## 2021-09-01 ENCOUNTER — Ambulatory Visit (INDEPENDENT_AMBULATORY_CARE_PROVIDER_SITE_OTHER): Payer: Medicare Other | Admitting: General Practice

## 2021-09-01 VITALS — BP 126/72 | HR 113 | Ht 65.0 in | Wt 136.4 lb

## 2021-09-01 DIAGNOSIS — I10 Essential (primary) hypertension: Secondary | ICD-10-CM

## 2021-09-01 DIAGNOSIS — R000 Tachycardia, unspecified: Secondary | ICD-10-CM

## 2021-09-01 DIAGNOSIS — C3490 Malignant neoplasm of unspecified part of unspecified bronchus or lung: Secondary | ICD-10-CM | POA: Diagnosis not present

## 2021-09-01 DIAGNOSIS — R600 Localized edema: Secondary | ICD-10-CM

## 2021-09-01 DIAGNOSIS — I2584 Coronary atherosclerosis due to calcified coronary lesion: Secondary | ICD-10-CM | POA: Diagnosis not present

## 2021-09-01 DIAGNOSIS — C50919 Malignant neoplasm of unspecified site of unspecified female breast: Secondary | ICD-10-CM

## 2021-09-01 DIAGNOSIS — I2510 Atherosclerotic heart disease of native coronary artery without angina pectoris: Secondary | ICD-10-CM | POA: Diagnosis not present

## 2021-09-01 NOTE — Patient Instructions (Signed)
Medication Instructions:  Your physician recommends that you continue on your current medications as directed. Please refer to the Current Medication list given to you today.  *If you need a refill on your cardiac medications before your next appointment, please call your pharmacy*   Follow-Up: At [facility], you and your health needs are our priority.  As part of our continuing mission to provide you with exceptional heart care, we have created designated Provider Care Teams.  These Care Teams include your primary Cardiologist (physician) and Advanced Practice Providers (APPs -  Physician Assistants and Nurse Practitioners) who all work together to provide you with the care you need, when you need it.  We recommend signing up for the patient portal called "MyChart".  Sign up information is provided on this After Visit Summary.  MyChart is used to connect with patients for Virtual Visits (Telemedicine).  Patients are able to view lab/test results, encounter notes, upcoming appointments, etc.  Non-urgent messages can be sent to your provider as well.   To learn more about what you can do with MyChart, go to [email or web address].    Your next appointment:   3-4 month(s)  The format for your next appointment:   In Person  Provider:   You may see Peter Martinique, MD or one of the following Advanced Practice Providers on your designated Care Team:   Almyra Deforest, PA-C Fabian Sharp, PA-C or  Roby Lofts, [place]   Other Instructions Coletta Memos, NP has recommended continuing a weight log. Please call our office if you have weight gain of 3lbs overnight or 5 lbs in a week.   He has also recommended increasing your physical activity as tolerated and eating a low salt diet.

## 2021-09-09 ENCOUNTER — Ambulatory Visit (HOSPITAL_COMMUNITY): Payer: Medicare Other | Attending: General Practice

## 2021-09-09 ENCOUNTER — Other Ambulatory Visit: Payer: Self-pay

## 2021-09-09 DIAGNOSIS — I2584 Coronary atherosclerosis due to calcified coronary lesion: Secondary | ICD-10-CM | POA: Diagnosis not present

## 2021-09-09 DIAGNOSIS — I10 Essential (primary) hypertension: Secondary | ICD-10-CM | POA: Insufficient documentation

## 2021-09-09 DIAGNOSIS — R0602 Shortness of breath: Secondary | ICD-10-CM

## 2021-09-09 DIAGNOSIS — R000 Tachycardia, unspecified: Secondary | ICD-10-CM | POA: Diagnosis not present

## 2021-09-09 DIAGNOSIS — I2510 Atherosclerotic heart disease of native coronary artery without angina pectoris: Secondary | ICD-10-CM | POA: Insufficient documentation

## 2021-09-09 DIAGNOSIS — R600 Localized edema: Secondary | ICD-10-CM | POA: Insufficient documentation

## 2021-09-09 DIAGNOSIS — C3490 Malignant neoplasm of unspecified part of unspecified bronchus or lung: Secondary | ICD-10-CM | POA: Insufficient documentation

## 2021-09-10 LAB — ECHOCARDIOGRAM COMPLETE
Area-P 1/2: 4.21 cm2
P 1/2 time: 167 msec
S' Lateral: 2.1 cm

## 2021-09-20 ENCOUNTER — Telehealth: Payer: Self-pay | Admitting: General Practice

## 2021-09-20 NOTE — Telephone Encounter (Signed)
Returned call to patient left message on personal voice mail one of our other nurses will call you back tomorrow to review echo results.

## 2021-09-20 NOTE — Telephone Encounter (Signed)
Patient is requesting a call back to review her recent echo results.

## 2021-09-21 NOTE — Telephone Encounter (Signed)
Spoke to patient, aware of echo results and recommendations. Verbalized understanding.

## 2021-09-29 ENCOUNTER — Other Ambulatory Visit (HOSPITAL_BASED_OUTPATIENT_CLINIC_OR_DEPARTMENT_OTHER): Payer: Self-pay

## 2021-09-29 MED ORDER — INFLUENZA VAC A&B SA ADJ QUAD 0.5 ML IM PRSY
PREFILLED_SYRINGE | INTRAMUSCULAR | 0 refills | Status: DC
  Filled 2021-09-29: qty 0.5, 1d supply, fill #0

## 2021-10-01 DIAGNOSIS — Z23 Encounter for immunization: Secondary | ICD-10-CM | POA: Diagnosis not present

## 2021-10-08 DIAGNOSIS — Z23 Encounter for immunization: Secondary | ICD-10-CM | POA: Diagnosis not present

## 2021-10-26 DIAGNOSIS — M810 Age-related osteoporosis without current pathological fracture: Secondary | ICD-10-CM | POA: Diagnosis not present

## 2021-10-26 DIAGNOSIS — Z170 Estrogen receptor positive status [ER+]: Secondary | ICD-10-CM | POA: Diagnosis not present

## 2021-10-26 DIAGNOSIS — Z96642 Presence of left artificial hip joint: Secondary | ICD-10-CM | POA: Diagnosis not present

## 2021-10-26 DIAGNOSIS — C50211 Malignant neoplasm of upper-inner quadrant of right female breast: Secondary | ICD-10-CM | POA: Diagnosis not present

## 2021-10-26 DIAGNOSIS — C3491 Malignant neoplasm of unspecified part of right bronchus or lung: Secondary | ICD-10-CM | POA: Diagnosis not present

## 2021-10-26 DIAGNOSIS — Z853 Personal history of malignant neoplasm of breast: Secondary | ICD-10-CM | POA: Diagnosis not present

## 2021-10-26 DIAGNOSIS — I700 Atherosclerosis of aorta: Secondary | ICD-10-CM | POA: Diagnosis not present

## 2021-10-26 DIAGNOSIS — E7800 Pure hypercholesterolemia, unspecified: Secondary | ICD-10-CM | POA: Diagnosis not present

## 2021-10-26 DIAGNOSIS — G4700 Insomnia, unspecified: Secondary | ICD-10-CM | POA: Diagnosis not present

## 2021-10-26 DIAGNOSIS — M5416 Radiculopathy, lumbar region: Secondary | ICD-10-CM | POA: Diagnosis not present

## 2022-01-17 ENCOUNTER — Telehealth: Payer: Self-pay | Admitting: *Deleted

## 2022-01-17 NOTE — Telephone Encounter (Signed)
CALLED PATIENT TO INFORM OF CT FOR 01-28-22- ARRIVAL TIME - 1 PM @ [facility], PATIENT TO HAVE WATER ONLY - 4 HRS. PRIOR TO TEST, PATIENT TO HAVE LABS DRAWN I-STAT IN RADIOLOGY, PATIENT TO RECEIVE RESULTS FROM ASHLYN BRUNING ON 02-02-22 @ 9 AM VIA TELEPHONE, SPOKE WITH PATIENT AND SHE VERIFIED UNDERSTANDING THESE APPTS.

## 2022-01-27 NOTE — Progress Notes (Signed)
Cardiology Office Note:    Date:  01/31/2022   ID:  TANEA MOGA, DOB May 17, 1926, MRN [id number]  PCP:  Haywood Pao, MD   [facility] Providers Cardiologist:  Alek Poncedeleon Martinique, MD      Referring MD: Haywood Pao, MD   Follow-up for lower extremity edema    History of Present Illness:    [name] French is a 88 y.o. female with a hx of secondary hypertension, coronary artery calcification, and tachycardia.  Her PMH also includes lung cancer with radiation therapy.  She was not felt to be a candidate for chemotherapy or surgery.  She was admitted overnight 12/25/2017 for evaluation of atypical chest pain.  Her CT chest was negative for PE.  She was noted to have left pleural effusion.  Her troponins and EKG were unremarkable.  Her echocardiogram was normal.  She was noted to have PACs and there was mention of a run of NSVT.  She was started on metoprolol.  Her chest pain was felt to be esophageal in nature.  She was seen by GI 01/25/2018 for evaluation of abnormal colon finding on her PET scan.  Follow-up evaluation was never completed.  It was noted that her heart rate was elevated to 150 bpm.  She had stopped taking her metoprolol 2 days prior to her evaluation.  No EKG was ordered at that time.   He was started back on her metoprolol 12.5 mg twice daily.  Initially she did well without side effects.  She did eventually report feeling very fatigued.  Attempts to taper Lyrica metoprolol resulted in increased heart rates.  She switched to 12.5 mg daily and she reported heart rates in the 130s in the a.m.'s.  She underwent nuclear stress test 4/19 for chest pain.  Her exam was unremarkable.  Subsequent CT scans showed left lung collapse with a large left effusion.   She was admitted to the hospital 6/21 with respiratory failure and severe hyponatremia.  Her hyponatremia improved with tolvaptan.  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  She is placed on fluid  restriction.  She was noted to have bilateral pleural effusions.  She underwent thoracentesis which produced transudative fluid.  An echocardiogram at that time showed moderate pulmonary hypertension, normal LV function, normal RV function.  She was transferred to inpatient rehab for 2 weeks after.    She presented to the emergency department on 07/26/2021 and was discharged on 07/27/2021 with complaints of chest discomfort.  Her troponins were 5 and 5, her chest pain resolved, her EKG showed sinus rhythm 99 bpm.  Cardiology signed off without further plans for ischemic evaluation she was discharged in stable condition.   She contacted the nurse triage line on 08/16/2021 and reported lower extremity swelling and increased shortness of breath.   She presented to the clinic 08/19/21 for evaluation and stated she had noticed increased work of breathing and lower extremity swelling x1 month. She was placed on lasix 40 mg daily.   Her lab work remained stable on 08/24/2021 and her weight log showed significant improvement. Repeat Echo in Sept 2022 showed normal LV function. Mild to mod MR, moderate AI, moderate Pulmonary HTN.  She is no longer on lasix. Swelling has not recurred. She has chronic SOB that is unchanged. CT of chest done yesterday was unchanged. No dizziness or syncope. No chest pain.   Past Medical History:  Diagnosis Date   Allergic rhinitis  Anemia    during pregnancy   Arthritis    Breast cancer ([place]) 05/08/13   right upper inner, invasive mammary   Breast cancer, right breast ([place]) 04/16/2013   Underwent lumpectomy on 11/06/13. 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   Use of letrozole ([place])    neoadjuvant antiestrogen therapy with letrozole 2.5 mg daily x 7 monhts    Past Surgical History:  Procedure Laterality Date   BREAST BIOPSY Left 1960   lt br bx/benign   BREAST LUMPECTOMY Right    BREAST LUMPECTOMY Right 02/10/2020   Procedure: RIGHT BREAST LUMPECTOMY;  Surgeon: Coralie Keens, MD;  Location: [facility];  Service: General;  Laterality: Right;   BREAST LUMPECTOMY WITH NEEDLE LOCALIZATION Bilateral 11/06/2013   Procedure: BREAST LUMPECTOMY WITH NEEDLE LOCALIZATION;  Surgeon: Haywood Lasso, MD;  Location: [facility];  Service: General;  Laterality: Bilateral;   COLONOSCOPY     EYE SURGERY Bilateral    both cataracts   MOHS SURGERY Right    nose basal/squamous   TOTAL HIP ARTHROPLASTY Left 08/31/2018   Procedure: LEFT TOTAL HIP ARTHROPLASTY ANTERIOR APPROACH;  Surgeon: Mcarthur Rossetti, MD;  Location: [facility];  Service: Orthopedics;  Laterality: Left;   VIDEO BRONCHOSCOPY WITH ENDOBRONCHIAL NAVIGATION N/A 11/01/2017   Procedure: VIDEO BRONCHOSCOPY WITH ENDOBRONCHIAL NAVIGATION;  Surgeon: [name], MD;  Location: [facility];  Service: Thoracic;  Laterality: N/A;   WRIST SURGERY  1990   lt    Current Medications: Current Meds  Medication Sig   acetaminophen (TYLENOL) 500 MG tablet Take 1,000 mg by mouth every 6 (six) hours as needed for moderate pain or headache.   Artificial Tear Solution (BION TEARS OP) Place 1-2 drops into both eyes as needed (dry eyes).    aspirin 81 MG tablet Take 81 mg by mouth daily.   B Complex-C-Folic Acid (SUPER B COMPLEX/FA/VIT C) TABS Take by mouth.   cetirizine (ZYRTEC) 10 MG tablet Take 10 mg by mouth daily.   Cholecalciferol (VITAMIN D-3) 1000 UNITS CAPS Take 1,000 Units daily by mouth.    doxylamine, Sleep, (UNISOM) 25 MG tablet Take 25 mg at bedtime by mouth.    famotidine (PEPCID) 20 MG tablet Take 20 mg by mouth daily.   Glucosamine-Chondroit-MSM-C-Mn CAPS Take 1 capsule by mouth daily.   metoprolol tartrate (LOPRESSOR) 25 MG tablet Take 0.5 tablets (12.5 mg total) by mouth 2 (two) times daily.   nitroGLYCERIN (NITROSTAT) 0.4 MG SL tablet Place 0.4 mg under the tongue every 5 (five) minutes as needed for chest pain.   pravastatin (PRAVACHOL) 40 MG tablet Take 40 mg daily by mouth.    Probiotic Product (ALIGN) 4 MG CAPS Take 4 mg daily by mouth.   zolpidem (AMBIEN) 5 MG tablet Take 5 mg by mouth at bedtime as needed.   [DISCONTINUED] furosemide (LASIX) 40 MG tablet Take 1 tablet (40 mg  total) by mouth daily. TAKE FOR 4 DAYS THEN STOP     Allergies:   Other, Shellfish allergy, Codeine, Hydrocodone, and Neosporin [neomycin-bacitracin zn-polymyx]   Social History   Socioeconomic History   Marital status: Divorced    Spouse name: Not on file   Number of children: 2   Years of education: Not on file   Highest education level: Not on file  Occupational History   Occupation: Retired  Tobacco Use   Smoking status: Former    Packs/day: 0.50    Years: 25.00    Pack years: 12.50    Types: Cigarettes    Quit date: 12/19/1978    Years since quitting: 43.1   Smokeless tobacco: Never  Vaping Use   Vaping Use: Never used  Substance and Sexual Activity   Alcohol use: Yes    Comment: "wine usually, no taste in the last in past few weeks- (02/07/2020)   Drug use: No   Sexual activity: Not Currently    Comment: menarche at age14, menopause in 12, HRT x 40, age at first live birth 58's, G50 P3  Other Topics Concern   Not on file  Social History Narrative   Not on file   Social Determinants of Health   Financial Resource Strain: Not on file  Food Insecurity: Not on file  Transportation Needs: Not on file  Physical Activity: Not on file  Stress: Not on file  Social Connections: Not on file     Family History: The patient's family history  includes Heart disease in her mother; Lung cancer in her sister; Prostate cancer in her father.  ROS:   Please see the history of present illness.     All other systems reviewed and are negative.   Risk Assessment/Calculations:           Physical Exam:    VS:  BP 126/70    Pulse (!) 106    Ht 5\' 5"  (1.651 m)    Wt 131 lb 6.4 oz (59.6 kg)    BMI 21.87 kg/m     Wt Readings from Last 3 Encounters:  01/31/22 131 lb 6.4 oz (59.6 kg)  09/01/21 136 lb 6.4 oz (61.9 kg)  08/19/21 146 lb 12.8 oz (66.6 kg)     GEN:  Well nourished, well developed in no acute distress HEENT: Normal NECK: No JVD; No carotid bruits LYMPHATICS: No lymphadenopathy CARDIAC: Tachycardic, no murmurs, rubs, gallops RESPIRATORY:  Clear to auscultation without rales, wheezing or rhonchi  ABDOMEN: Soft, non-tender, non-distended MUSCULOSKELETAL:  No edema; No deformity  SKIN: Warm and dry NEUROLOGIC:  Alert and oriented x 3 PSYCHIATRIC:  Normal affect    EKGs/Labs/Other Studies Reviewed:    The following studies were reviewed today: Nuclear stress test 03/23/2018 Nuclear stress EF: 56%. Clinically and electrically negative for ischemia Normal perfusiion No ischemia or scar This is a low risk study.       Echocardiogram 05/29/2020 IMPRESSIONS     1. Left ventricular ejection fraction, by estimation, is 60 to 65%. The  left ventricle has normal function. The left ventricle has no regional  wall motion abnormalities. Left ventricular diastolic parameters are  consistent with Grade II diastolic  dysfunction (pseudonormalization).   2. Right ventricular systolic function is normal. The right ventricular  size is mildly enlarged. There is moderately elevated pulmonary artery  systolic pressure. The estimated right ventricular systolic pressure is  69.6 mmHg.   3. Right atrial size was moderately dilated.   4. The  mitral valve is normal in structure. Mild mitral valve  regurgitation. No evidence of  mitral stenosis.   5. Tricuspid valve regurgitation is moderate.   6. The aortic valve is normal in structure. Aortic valve regurgitation is  mild. No aortic stenosis is present.   7. The inferior vena cava is normal in size with greater than 50%  respiratory variability, suggesting right atrial pressure of 3 mmHg.  Echo 09/09/21: IMPRESSIONS     1. Left ventricular ejection fraction, by estimation, is 65 to 70%. The  left ventricle has normal function. The left ventricle has no regional  wall motion abnormalities. Left ventricular diastolic parameters are  indeterminate. There is the  interventricular septum is flattened in systole, consistent with right  ventricular pressure overload.   2. Right ventricular systolic function is mildly reduced. The right  ventricular size is mildly enlarged. There is moderately elevated  pulmonary artery systolic pressure. The estimated right ventricular  systolic pressure is 20.9 mmHg.   3. Right atrial size was mildly dilated.   4. The mitral valve is normal in structure. Mild to moderate mitral valve  regurgitation. No evidence of mitral stenosis.   5. The aortic valve is grossly normal. Aortic valve regurgitation is  moderate. Mild aortic valve sclerosis is present, with no evidence of  aortic valve stenosis.   6. The inferior vena cava is normal in size with greater than 50%  respiratory variability, suggesting right atrial pressure of 3 mmHg.   Comparison(s): 05/29/20 60-65%, valve disease similar to slightly  progressed on current study without severe disease.   EKG: today. Sinus tachy rate 106, nonspecfic TWA. I have personally reviewed and interpreted this study.    Recent Labs: 07/26/2021: ALT 9; Hemoglobin 14.0; Platelets 201 08/24/2021: BUN 12; Potassium 4.9; Sodium 137 01/28/2022: Creatinine, Ser 0.80  Recent Lipid Panel    Component Value Date/Time   CHOL 158 12/26/2017 0025   TRIG 167 (H) 12/26/2017 0025   HDL 51 12/26/2017 0025    CHOLHDL 3.1 12/26/2017 0025   VLDL 33 12/26/2017 0025   LDLCALC 74 12/26/2017 0025   Dated 04/15/21: cholesterol 174, triglycerides 71, HDL 71, LDL 89.    ASSESSMENT & PLAN    Lower extremity edema/shortness of breath-euvolemic today. Edema resolved.  History of lung CA.  History of chronic left pleural effusion. Monitor Daily weights Heart healthy low-sodium diet Echocardiogram as noted above with normal LV function.   2. Essential hypertension- Well-controlled  Continue metoprolol    3. Sinus tachycardia-heart rate today 106 bpm.  chronic  Continue metoprolol to 25 mg a.m. and continue 12.5 mg in the p.m.   4. Coronary calcium-no recent episodes of arm neck back or chest discomfort. Nuclear stress test showed normal perfusion. Continue aspirin, metoprolol, nitroglycerin, pravastatin Heart healthy low-sodium diet-salty 6 given Increase physical activity as tolerated   5. Lung cancer-status postradiation therapy.  Follow-up CT showed stable findings this past Friday.  Noted to have chronic left pleural effusion has done well since bilateral thoracentesis.   6. Breast cancer-completed chemotherapy.  There is a recurrent breast CA on the right. Follows with oncology       Disposition: Follow-up in one year.       Medication Adjustments/Labs and Tests Ordered: Current medicines are reviewed at length with the patient today.  Concerns regarding medicines are outlined above.  No orders of the defined types were placed in this encounter.  No orders of the defined types were placed in this encounter.  There are no Patient Instructions on file for this visit.   Signed, Zayda Angell Martinique, MD  01/31/2022 9:51 AM

## 2022-01-28 ENCOUNTER — Ambulatory Visit (HOSPITAL_COMMUNITY)
Admission: RE | Admit: 2022-01-28 | Discharge: 2022-01-28 | Disposition: A | Discharge status: Home / Self Care | Payer: Medicare Other | Source: Ambulatory Visit | Attending: Urology | Admitting: Urology

## 2022-01-28 ENCOUNTER — Other Ambulatory Visit: Payer: Self-pay

## 2022-01-28 DIAGNOSIS — C3432 Malignant neoplasm of lower lobe, left bronchus or lung: Secondary | ICD-10-CM | POA: Insufficient documentation

## 2022-01-28 DIAGNOSIS — C3491 Malignant neoplasm of unspecified part of right bronchus or lung: Secondary | ICD-10-CM | POA: Diagnosis not present

## 2022-01-28 DIAGNOSIS — R918 Other nonspecific abnormal finding of lung field: Secondary | ICD-10-CM | POA: Diagnosis not present

## 2022-01-28 DIAGNOSIS — R911 Solitary pulmonary nodule: Secondary | ICD-10-CM | POA: Diagnosis not present

## 2022-01-28 DIAGNOSIS — C3490 Malignant neoplasm of unspecified part of unspecified bronchus or lung: Secondary | ICD-10-CM | POA: Diagnosis not present

## 2022-01-28 DIAGNOSIS — I700 Atherosclerosis of aorta: Secondary | ICD-10-CM | POA: Diagnosis not present

## 2022-01-28 DIAGNOSIS — J90 Pleural effusion, not elsewhere classified: Secondary | ICD-10-CM | POA: Diagnosis not present

## 2022-01-28 LAB — POCT I-STAT CREATININE: Creatinine, Ser: 0.8 mg/dL (ref 0.44–1.00)

## 2022-01-28 MED ORDER — SODIUM CHLORIDE (PF) 0.9 % IJ SOLN
INTRAMUSCULAR | Status: AC
  Filled 2022-01-28: qty 50

## 2022-01-28 MED ORDER — IOHEXOL 300 MG/ML  SOLN
75.0000 mL | Freq: Once | INTRAMUSCULAR | Status: AC | PRN
  Administered 2022-01-28: 75 mL via INTRAVENOUS

## 2022-01-31 ENCOUNTER — Other Ambulatory Visit: Payer: Self-pay

## 2022-01-31 ENCOUNTER — Ambulatory Visit (INDEPENDENT_AMBULATORY_CARE_PROVIDER_SITE_OTHER): Payer: Medicare Other | Admitting: Cardiology

## 2022-01-31 ENCOUNTER — Encounter: Payer: Self-pay | Admitting: Cardiology

## 2022-01-31 VITALS — BP 126/70 | HR 106 | Ht 65.0 in | Wt 131.4 lb

## 2022-01-31 DIAGNOSIS — I10 Essential (primary) hypertension: Secondary | ICD-10-CM | POA: Diagnosis not present

## 2022-01-31 DIAGNOSIS — I2584 Coronary atherosclerosis due to calcified coronary lesion: Secondary | ICD-10-CM | POA: Diagnosis not present

## 2022-01-31 DIAGNOSIS — I2510 Atherosclerotic heart disease of native coronary artery without angina pectoris: Secondary | ICD-10-CM | POA: Diagnosis not present

## 2022-01-31 DIAGNOSIS — R600 Localized edema: Secondary | ICD-10-CM | POA: Diagnosis not present

## 2022-02-01 ENCOUNTER — Encounter: Payer: Self-pay | Admitting: Urology

## 2022-02-01 NOTE — Progress Notes (Signed)
Spoke w/ patient, verified identity and begin nursing interview. Patient states "No symptoms to report at this time."  Meaningful use complete. Postmenopausal-No chances of pregnancy.  Patient notified of her 9:00am-02/02/22 telephone appointment w/ Ashlyn Bruning PA-C. I left my extension [phone] in case patient needs to call. Patient verbalized understanding of information.  Patient contact [phone]

## 2022-02-02 ENCOUNTER — Ambulatory Visit
Admission: RE | Admit: 2022-02-02 | Discharge: 2022-02-02 | Disposition: A | Discharge status: Home / Self Care | Payer: Medicare Other | Source: Ambulatory Visit | Attending: Urology | Admitting: Urology

## 2022-02-02 DIAGNOSIS — C3432 Malignant neoplasm of lower lobe, left bronchus or lung: Secondary | ICD-10-CM

## 2022-02-02 NOTE — Progress Notes (Signed)
[name] French  Radiation Oncology         [phone]) [phone] ________________________________  Name: [name] French MRN: [id number]  Date: 02/02/2022  DOB: 1926/07/25  Post Treatment Note  CC: Tisovec, Fransico Him, MD  Tisovec, Fransico Him, MD  Diagnosis:   88 y.o. woman with newly diagnosed synchronous NSCLC, adenocarcinoma with a 6 cm LLL mass extending into the left hilum (stage IIB, T3N0M0) and a 2.4 cm RML mass (stage IB T2N0).      Interval Since Last Radiation:  4 years 11/28/17 - 01/18/18:  66 Gy directed to the LLL and RML lung lesions in 33 fractions of 2 Gy each.  Narrative:  I spoke with the patient to conduct her routine scheduled 6 month follow up visit to review her recent follow-up CT chest results via telephone to spare the patient unnecessary potential exposure in the healthcare setting during the current COVID-19 pandemic.  The patient was notified in advance and gave permission to proceed with this visit format. Her most recent CT Chest rom 01/28/22 shows a stable appearance of the presumed post radiation changes in the inferomedial left hemithorax with dense, essentially complete consolidation and fibrosis of the left lower lobe, volume loss of the left hemithorax, and a stable, moderate loculated left pleural effusion. Additional bandlike scarring and fibrosis of the anterior right upper lobe, is somewhat increased compared to prior examination with an unchanged appearance of small pulmonary nodules in the right lower lobe and anterior right middle lobe.  No new nodules or findings to suggest disease recurrence or progression. She did not receive any systemic treatment for the lung cancer, as the feeling was that concurrent chemoradiation would be too toxic for the patient due to her age.  However, molecular studies were ordered to assess for a potential role of immunotherapy if she demonstrated PD1 expression greater than 50% but her molecular studies returned showing only a very low expression  of PD1 at 5%.  There were no other identifiable mutations noted.  She completed a 6.5 week course of radiotherapy which she tolerated relatively well and has remained in observation with Dr. Lindi Adie.   Unfortunately, she was found to have a recurrence of her right breast cancer with a 4.2 cm mass in the right breast detected on diagnostic mammography on December 23, 2019 with confirmation of a grade 2 invasive lobular carcinoma with LCIS, ER/PR positive, and HER-2 neu positive via right breast biopsy performed on 01/03/2020.  Fortunately, there was no evidence of distant metastatic disease on CT C/A/P 01/17/2020.  She had a lumpectomy with Dr. Ninfa Linden on 02/06/20 and has done very well with no evidence of recurrence on follow-up mammograms.  In summary, she has a remote history of breast cancer diagnosed and treated in 2014, an was initially seen in consult on 11/15/18 at the request of Dr. Lindi Adie for newly diagnosed non-small cell lung cancer and incidental finding of meningioma. She presented to her PCP in October 2018 with persistent dry cough that led to an isolated episode of hemoptysis. She denied further episodes of hemoptysis since that time. She presented to her PCP for evaluation and was being worked up for possible pneumonia with a chest x-ray and a CT scan of the chest. CT done on 10/11/2017 showed a 6.0 cm LLL lung mass with additional lung nodules, including a 2.4 cm pulmonary nodule in the RML as well as 0.3 cm satellite nodules. There was also a hypodense 0.4 cm RLL lesion. A PET CT scan on  112/18 demonstrated a hypermetabolic LLL mass extending into the left hilum with SUV of 26.8 (stage IIb, T3N0M0) and a mildly hypermetabolic RML mass with SUV max of 1.9 (stage IB T2N0).  There were no hypermetabolic hilar or mediastinal nodes.  She was evaluated with Dr. Roxan Hockey and underwent bilateral navigational bronchoscopy with biopsies of the lung masses on 11/01/17 which revealed bilateral non-small cell  lung cancer, poorly differentiated adenocarcinoma.    An MRI brain was performed on 10/20/2017 for disease staging and incidentally showed a right inferior parietal convexity meningioma measuring 2.9 x 2.7 x 1.4 cm. There was no evidence of metastatic disease. Her imaging was reviewed at the multidisciplinary brain conference and consensus recommendation was to repeat a scan in 3 months to assess for progression/enlargement. Repeat brain MRI was performed on 01/24/2018 demonstrating a persistent right temporoparietal meningioma unchanged in size with only mild vasogenic edema which appeared improved from her previous study.  There was no evidence of metastatic disease to the brain.  This study was again reviewed at the multidisciplinary brain conference on 01/29/2018 and consensus recommendation was to only repeat brain imaging if the patient develops symptoms.                       On review of systems, the patient states that she is doing well overall. Currently, she denies orthopnea, chest pain, dysphagia, fever, chills, cough or hemoptysis. She has persistent baseline shortness of breath with exertion and fatigue which has remained unchanged since 10/2017,  increased with activity but stable over the past year.  She denies dyspnea at rest and reports that the shortness of breath with exertion resolves quickly with rest.  She has continued to tolerate the dose adjusted metoprolol for tachycardia and is followed with Dr. Peter Martinique. She denies headaches, changes in visual or auditory acuity, difficulty with speech or word finding, imbalance, tinnitus, tremor or seizure activity.    ALLERGIES:  is allergic to other, shellfish allergy, codeine, hydrocodone, and neosporin [neomycin-bacitracin zn-polymyx].  Meds: Current Outpatient Medications  Medication Sig Dispense Refill   acetaminophen (TYLENOL) 500 MG tablet Take 1,000 mg by mouth every 6 (six) hours as needed for moderate pain or headache.      Artificial Tear Solution (BION TEARS OP) Place 1-2 drops into both eyes as needed (dry eyes).      aspirin 81 MG tablet Take 81 mg by mouth daily.     B Complex-C-Folic Acid (SUPER B COMPLEX/FA/VIT C) TABS Take by mouth.     cetirizine (ZYRTEC) 10 MG tablet Take 10 mg by mouth daily.     Cholecalciferol (VITAMIN D-3) 1000 UNITS CAPS Take 1,000 Units daily by mouth.      doxylamine, Sleep, (UNISOM) 25 MG tablet Take 25 mg at bedtime by mouth.     famotidine (PEPCID) 20 MG tablet Take 20 mg by mouth daily.     Glucosamine-Chondroit-MSM-C-Mn CAPS Take 1 capsule by mouth daily.     metoprolol tartrate (LOPRESSOR) 25 MG tablet Take 0.5 tablets (12.5 mg total) by mouth 2 (two) times daily. 30 tablet 2   nitroGLYCERIN (NITROSTAT) 0.4 MG SL tablet Place 0.4 mg under the tongue every 5 (five) minutes as needed for chest pain.     pravastatin (PRAVACHOL) 40 MG tablet Take 40 mg daily by mouth.      Probiotic Product (ALIGN) 4 MG CAPS Take 4 mg daily by mouth.     zolpidem (AMBIEN) 5 MG tablet Take 5 mg  by mouth at bedtime as needed.     No current facility-administered medications for this encounter.    Physical Findings:  vitals were not taken for this visit.  Pain Assessment Pain Score: 0-No pain/10 Unable to assess due to telephone follow up visit format.  Lab Findings: Lab Results  Component Value Date   WBC 10.1 07/26/2021   HGB 14.0 07/26/2021   HCT 42.2 07/26/2021   MCV 85.8 07/26/2021   PLT 201 07/26/2021     Radiographic Findings: CT Chest W Contrast  Result Date: 01/31/2022 CLINICAL DATA:  Left lower lobe lung cancer restaging EXAM: CT CHEST WITH CONTRAST TECHNIQUE: Multidetector CT imaging of the chest was performed during intravenous contrast administration. RADIATION DOSE REDUCTION: This exam was performed according to the departmental dose-optimization program which includes automated exposure control, adjustment of the mA and/or kV according to patient size and/or use of  iterative reconstruction technique. CONTRAST:  19m OMNIPAQUE IOHEXOL 300 MG/ML  SOLN COMPARISON:  CT chest angiogram, 07/26/2021, CT chest, 02/01/2021 FINDINGS: Cardiovascular: Aortic atherosclerosis. Normal heart size. Left and right coronary artery calcifications. No pericardial effusion. Mediastinum/Nodes: No enlarged mediastinal, hilar, or axillary lymph nodes. Thyroid gland, trachea, and esophagus demonstrate no significant findings. Lungs/Pleura: Unchanged post treatment appearance of the left chest, with dense essentially complete consolidation and fibrosis of the left lower lobe, volume loss of the left hemithorax, and a moderate, loculated left pleural effusion. Additional bandlike scarring and fibrosis of the anterior right upper lobe, somewhat increased compared to prior examination (series 7, image 49). Unchanged small pulmonary nodules for example a 0.4 cm subpleural nodule of the dependent right lower lobe (series 9, image 103) and a 0.4 cm subpleural nodule of the anterior right middle lobe (series 9, image 86). Upper Abdomen: No acute abnormality. Unchanged small left adrenal nodule, measuring 1.5 x 0.9 cm (series 4, image 151). Musculoskeletal: No chest wall abnormality. No suspicious osseous lesions identified. IMPRESSION: 1. Unchanged post treatment appearance of the left chest, with dense, essentially complete consolidation and fibrosis of the left lower lobe, volume loss of the left hemithorax, and a moderate, loculated left pleural effusion. 2. Additional bandlike scarring and fibrosis of the anterior right upper lobe, somewhat increased in density compared to prior examination, likely reflecting some degree of atelectasis superimposed upon radiation fibrosis. Attention on follow-up. 3. Stable small pulmonary nodules, most likely benign and incidental. Attention on follow-up. 4. Coronary artery disease. Aortic Atherosclerosis (ICD10-I70.0). Electronically Signed   By: ADelanna AhmadiM.D.   On:  01/31/2022 09:02     Impression/Plan: 1. 88y.o. female with synchronous NSCLC, adenocarcinoma with a 6 cm LLL mass extending into the left hilum (stage IIB, T3N0M0) and a 2.4 cm RML mass (stage IB T2N0).   She remains stable both clinically and radiographically with regards to the lung cancer.  Her most recent CT Chest from 01/28/22 shows a stable appearance of presumed post radiation changes in the inferomedial left hemithorax with dense, essentially complete consolidation and fibrosis of the left lower lobe, volume loss of the left hemithorax, and a stable, moderate loculated left pleural effusion. Additional bandlike scarring and fibrosis of the anterior right upper lobe, is somewhat increased compared to prior examination with an unchanged appearance of small pulmonary nodules in the right lower lobe and anterior right middle lobe.  No new nodules or findings to suggest disease recurrence or progression. We will continue with serial chest CT scans every 6 months for continued close monitoring and I will call her  following each scan to review results and recommendations.  She knows to call immediately for any progressive SOB or concerning symptoms in the interim. She had a therapeutic thoracentesis in June 2021 but is reluctant to pursue further thoracentesis for pleural effusion unless felt absolutely necessary since she remains relatively asymptomatic.  We again discussed that should there be any progression in fluid accumulation or progressive shortness of breath with less exertion, we would need to consider moving forward with pulmonary evaluation and/or thoracentesis for therapeutic and diagnostic purposes.  She is in agreement with the stated plan.  2.         Meningioma. This was an incidental finding and patient is currently without neurologic or systemic complaints.  It does not appear to be causing a mass effect on any structures in the brain and remains stable on MRI brain from 01/24/2018. The  consensus at brain conference from 01/29/2018 is to only repeat brain imaging if she becomes symptomatic.  Otherwise we will just follow this expectantly and only repeat further brain imaging should she develop concerning symptoms. She is in agreement with and is comfortable with this plan.  3. Tachycardia.  This is being managed by her cardiologist, Dr. Peter Martinique.  4. Recurrent right breast cancer. She was found to have a recurrence of her right breast cancer with a 4.2 cm mass in the right breast detected on diagnostic mammography on December 23, 2019 with confirmation of a grade 2 invasive lobular carcinoma with LCIS, ER/PR positive, and HER-2 neu positive via right breast biopsy performed on 01/03/2020.  Fortunately, there was no evidence of distant metastatic disease on CT C/A/P 01/17/2020.  She had a lumpectomy with Dr. Ninfa Linden on 02/06/20 but adamantly not interested in adjuvant radiotherapy.  She will continue in routine follow up for continued systemic disease management with Dr. Lindi Adie.      Nicholos Johns, PA-C

## 2022-02-03 ENCOUNTER — Other Ambulatory Visit: Payer: Self-pay | Admitting: Hematology and Oncology

## 2022-02-03 DIAGNOSIS — Z9889 Other specified postprocedural states: Secondary | ICD-10-CM

## 2022-03-03 ENCOUNTER — Other Ambulatory Visit: Payer: Self-pay | Admitting: Hematology and Oncology

## 2022-03-03 ENCOUNTER — Ambulatory Visit
Admission: RE | Admit: 2022-03-03 | Discharge: 2022-03-03 | Disposition: A | Discharge status: Home / Self Care | Payer: Medicare Other | Source: Ambulatory Visit | Attending: Hematology and Oncology | Admitting: Hematology and Oncology

## 2022-03-03 ENCOUNTER — Other Ambulatory Visit: Payer: Self-pay

## 2022-03-03 DIAGNOSIS — R922 Inconclusive mammogram: Secondary | ICD-10-CM | POA: Diagnosis not present

## 2022-03-15 ENCOUNTER — Inpatient Hospital Stay: Payer: Medicare Other | Admitting: Hematology and Oncology

## 2022-03-21 NOTE — Progress Notes (Signed)
? ?Patient Care Team: ?Tisovec, Fransico Him, MD as PCP - General (Internal Medicine) ?Martinique, Peter M, MD as PCP - Cardiology (Cardiology) ?Nicholas Lose, MD as Consulting Physician (Hematology and Oncology) ? ?DIAGNOSIS:  ?Encounter Diagnosis  ?Name Primary?  ? Malignant neoplasm of upper-inner quadrant of right breast in female, estrogen receptor positive ([place])   ? ? ?SUMMARY OF ONCOLOGIC HISTORY: ?Oncology History  ?Malignant neoplasm of upper-inner quadrant of right breast in female, estrogen receptor positive ([place])  ?05/08/2013 Initial Biopsy  ? Right: grade 1-2 invasive mammary carcinoma ER positive PR positive HER-2/neu negative with Ki-67 30% (MRI 2.8 cm)second smaller mass together 3.8cm ?  ?05/11/2013 - 11/04/2013 Anti-estrogen oral therapy  ? Letrozole 2.5 mg Neoadjuvant anti-estrogen therapy ?  ?11/06/2013 Surgery  ? Bilateral Lumpectomies: Left: sclerosing lesion with ALH fibrocystic changes with microcalcifications. Right: Grade 1 ILC 2.7 cm with LCIS ?  ? Radiation Therapy  ? Patient declined ?  ?11/19/2013 - 06/09/2015 Anti-estrogen oral therapy  ? Letrozole 2.5 mg (stopped for arthralgias and myalgias and fatigue accompanied with hair loss) ?  ?01/01/2020 Relapse/Recurrence  ? Screening mammogram detected a right breast mass. Diagnostic mammogram showed a 4.2cm mass at the 11 o'clock position in the right breast. Biopsy showed invasive mammary carcinoma, grade 2, HER-2 + (3+), ER+ 95%, PR+ 90%, Ki67 20%.  ?  ?02/10/2020 Surgery  ? Right lumpectomy Ninfa Linden): mammary carcinoma, grade 2, 3.3cm, clear margins. ?  ?02/14/2020 -  Anti-estrogen oral therapy  ? Anastrozole, half tablet daily ?  ?03/05/2020 -  Chemotherapy  ? The patient had trastuzumab-hyaluronidase-oysk (HERCEPTIN HYLECTA) 600-10000 MG-UNT/5ML chemo SQ injection 600 mg, 600 mg, Subcutaneous,  Once, 9 of 12 cycles ?Administration: 600 mg (03/05/2020), 600 mg (03/26/2020), 600 mg (06/16/2020), 600 mg (04/16/2020), 600 mg (05/07/2020), 600 mg (07/09/2020),  600 mg (07/30/2020), 600 mg (08/20/2020), 600 mg (09/14/2020) ? ? for chemotherapy treatment.  ? ?  ?Non-small cell cancer of right lung ([place])  ?10/20/2017 PET scan  ? 6 cm central left lower lobe pulmonary mass is markedly hypermetabolic with SUV max = 75.9 and extends into the left hilum.   ?The sub solid 2.4 cm pulmonary nodule in the right middle lobe also ?shows FDG accumulation with SUV max = 1.9. No evidence for hypermetabolic mediastinal or right hilar lymphadenopathy. L2 uptake degenerative.  LLL:T3N0M0 stage IIb clinical stage; RML: T2N0 Stage 1B ?  ?10/20/2017 Imaging  ? MRI brain: No metastatic disease, right inferior parietal convexity meningioma 2.9 x 2.7 x 1.4 cm.  This indents the brain and associated with mild brain edema ?  ?11/01/2017 Initial Diagnosis  ? Transbronchial needle aspiration right middle lobe and left lower lobe brushings: Both are positive for malignant cells consistent with non-small cell lung cancer  ?  ?11/29/2017 - 01/18/2018 Radiation Therapy  ? Radiation ?  ?12/01/2017 Pathology Results  ? Foundation 1:TPS score: 5%; MS-Stable, TMG High, AKT2 Amp, RB1, TP 53 (no mutations noted in EGFR, K-ras, Al, BRAF, RET, ERBB2, Ros 1) ?  ? ? ?CHIEF COMPLIANT: Follow-up of recurrent right breast cancer on anastrozole ? ?INTERVAL HISTORY: [name] French is a 88 y.o. with above-mentioned history of right breast cancer treated with lumpectomy, Herceptin maintenance, and who is currently on antiestrogen therapy with anastrozole. She presents to the clinic for follow-up. She states that she feels that she is in good spot. She complains of shortness of breath sometimes when she walks. ? ? ?ALLERGIES:  is allergic to other, shellfish allergy, codeine, hydrocodone, and neosporin [neomycin-bacitracin zn-polymyx]. ? ?  MEDICATIONS:  ?Current Outpatient Medications  ?Medication Sig Dispense Refill  ? acetaminophen (TYLENOL) 500 MG tablet Take 1,000 mg by mouth every 6 (six) hours as needed for moderate pain  or headache.    ? Artificial Tear Solution (BION TEARS OP) Place 1-2 drops into both eyes as needed (dry eyes).     ? aspirin 81 MG tablet Take 81 mg by mouth daily.    ? B Complex-C-Folic Acid (SUPER B COMPLEX/FA/VIT C) TABS Take by mouth.    ? cetirizine (ZYRTEC) 10 MG tablet Take 10 mg by mouth daily.    ? Cholecalciferol (VITAMIN D-3) 1000 UNITS CAPS Take 1,000 Units daily by mouth.     ? doxylamine, Sleep, (UNISOM) 25 MG tablet Take 25 mg at bedtime by mouth.    ? famotidine (PEPCID) 20 MG tablet Take 20 mg by mouth daily.    ? Glucosamine-Chondroit-MSM-C-Mn CAPS Take 1 capsule by mouth daily.    ? metoprolol tartrate (LOPRESSOR) 25 MG tablet Take 1 tablet (25 mg total) by mouth 2 (two) times daily. 1 tab in AM and 0.5 tab in PM 30 tablet 2  ? nitroGLYCERIN (NITROSTAT) 0.4 MG SL tablet Place 0.4 mg under the tongue every 5 (five) minutes as needed for chest pain.    ? pravastatin (PRAVACHOL) 40 MG tablet Take 40 mg daily by mouth.     ? Probiotic Product (ALIGN) 4 MG CAPS Take 4 mg daily by mouth.    ? zolpidem (AMBIEN) 5 MG tablet Take 5 mg by mouth at bedtime as needed.    ? ?No current facility-administered medications for this visit.  ? ? ?PHYSICAL EXAMINATION: ?ECOG PERFORMANCE STATUS: 1 - Symptomatic but completely ambulatory ? ?Vitals:  ? 03/22/22 1057  ?BP: 114/71  ?Pulse: 78  ?Resp: 18  ?Temp: (!) 97.2 ?F (36.2 ?C)  ?SpO2: 100%  ? ?Filed Weights  ? 03/22/22 1057  ?Weight: 133 lb 4.8 oz (60.5 kg)  ? ? ?BREAST: No palpable masses or nodules in either right or left breasts. No palpable axillary supraclavicular or infraclavicular adenopathy no breast tenderness or nipple discharge. (exam performed in the presence of a chaperone) ? ?LABORATORY DATA:  ?I have reviewed the data as listed ? ?  Latest Ref Rng & Units 01/28/2022  ?  2:06 PM 08/24/2021  ?  2:20 PM 07/26/2021  ? 10:26 AM  ?CMP  ?Glucose 65 - 99 mg/dL  79   108    ?BUN 10 - 36 mg/dL  12   16    ?Creatinine 0.44 - 1.00 mg/dL 0.80   0.86   0.70    ?Sodium  134 - 144 mmol/L  137   133    ?Potassium 3.5 - 5.2 mmol/L  4.9   4.2    ?Chloride 96 - 106 mmol/L  95   96    ?CO2 20 - 29 mmol/L  29   29    ?Calcium 8.7 - 10.3 mg/dL  9.6   9.4    ?Total Protein 6.5 - 8.1 g/dL   6.9    ?Total Bilirubin 0.3 - 1.2 mg/dL   1.1    ?Alkaline Phos 38 - 126 U/L   53    ?AST 15 - 41 U/L   12    ?ALT 0 - 44 U/L   9    ? ? ?Lab Results  ?Component Value Date  ? WBC 10.1 07/26/2021  ? HGB 14.0 07/26/2021  ? HCT 42.2 07/26/2021  ? MCV 85.8  07/26/2021  ? PLT 201 07/26/2021  ? NEUTROABS 3.8 09/14/2020  ? ? ?ASSESSMENT & PLAN:  ?Malignant neoplasm of upper-inner quadrant of right breast in female, estrogen receptor positive ([place]) ?12/23/2019: Suspicious 4.2 cm mass in the right breast, no abnormal right axillary lymph nodes ?01/03/2020: Right breast biopsy 11 o'clock position: Grade 2 invasive lobular carcinoma with LCIS ER 95%, PR 90%, Ki-67 20%, HER-2 3+ positive ?  ?CT CAP 01/17/20: Masslike consolidation following radiation measuring 6.8 x 3.6 cm as compared to 7.2 by 4.2 cm. Pleural thickening approximately 6 mm . No metastatic disease ?  ?02/10/2020:Right lumpectomy Ninfa Linden): mammary carcinoma, grade 2, 3.3cm, clear margins. ?Adjuvant anastrozole with Herceptin subcutaneously started 03/05/2020 (discontinued by her preference on 09/14/20) ?  ?------------------------------------------------------------------------------------------------------------------------------------------ ?Breast Cancer Surveillance: ?1. Breast Exam: 03/21/2022: Benign ?2. Mammogram and ultrasound: 03/03/2022: 3 tiny masses along the surgical scar in the retroareolar right breast which may represent fat necrosis.  36-month follow-up recommended (scheduled for September 2023) ?  ?Shortness of breath: Same as before ?She does water aerobics and appears to be handling that very well. ?  ?RTC in 1 year for follow up ? ? ? ?No orders of the defined types were placed in this encounter. ? ?The patient has a good understanding of  the overall plan. she agrees with it. she will call with any problems that may develop before the next visit here. ?Total time spent: 30 mins including face to face time and time spent for planning, c

## 2022-03-22 ENCOUNTER — Other Ambulatory Visit: Payer: Self-pay

## 2022-03-22 ENCOUNTER — Inpatient Hospital Stay: Payer: Medicare Other | Attending: Hematology and Oncology | Admitting: Hematology and Oncology

## 2022-03-22 DIAGNOSIS — R0602 Shortness of breath: Secondary | ICD-10-CM | POA: Insufficient documentation

## 2022-03-22 DIAGNOSIS — Z170 Estrogen receptor positive status [ER+]: Secondary | ICD-10-CM | POA: Insufficient documentation

## 2022-03-22 DIAGNOSIS — Z7982 Long term (current) use of aspirin: Secondary | ICD-10-CM | POA: Insufficient documentation

## 2022-03-22 DIAGNOSIS — Z79899 Other long term (current) drug therapy: Secondary | ICD-10-CM | POA: Insufficient documentation

## 2022-03-22 DIAGNOSIS — Z79811 Long term (current) use of aromatase inhibitors: Secondary | ICD-10-CM | POA: Diagnosis not present

## 2022-03-22 DIAGNOSIS — C50211 Malignant neoplasm of upper-inner quadrant of right female breast: Secondary | ICD-10-CM | POA: Insufficient documentation

## 2022-03-22 DIAGNOSIS — Z9221 Personal history of antineoplastic chemotherapy: Secondary | ICD-10-CM | POA: Insufficient documentation

## 2022-03-22 MED ORDER — METOPROLOL TARTRATE 25 MG PO TABS: 25.0000 mg | ORAL_TABLET | Freq: Two times a day (BID) | ORAL | 2 refills | Status: DC

## 2022-03-22 NOTE — Assessment & Plan Note (Addendum)
12/23/2019: Suspicious 4.2 cm mass in the right breast, no abnormal right axillary lymph nodes ?01/03/2020:?Right breast biopsy 11 o'clock position: Grade 2 invasive lobular carcinoma with LCIS ER 95%, PR 90%, Ki-67 20%, HER-2 3+ positive ?? ?CT CAP 01/17/20:?Masslike consolidation following radiation measuring 6.8 x 3.6 cm as compared to 7.2 by 4.2 cm. Pleural thickening approximately 6 mm?Marland Kitchen No metastatic disease ?? ?02/10/2020:Right lumpectomy Ninfa Linden): mammary carcinoma, grade 2, 3.3cm, clear margins. ?Adjuvant anastrozole with Herceptin subcutaneously started 03/05/2020 (discontinued by her preference on 09/14/20) ?? ?------------------------------------------------------------------------------------------------------------------------------------------ ?Breast Cancer Surveillance: ?1. Breast Exam: 03/21/2022: Benign ?2. Mammogram and ultrasound: 03/03/2022: 3 tiny masses along the surgical scar in the retroareolar right breast which may represent fat necrosis.  3-monthfollow-up recommended (scheduled for September 2023) ?? ?Shortness of breath: Same as before ?She does water aerobics and appears to be handling that very well. ?? ?RTC in 1 year for follow up ?

## 2022-05-02 DIAGNOSIS — E7800 Pure hypercholesterolemia, unspecified: Secondary | ICD-10-CM | POA: Diagnosis not present

## 2022-05-02 DIAGNOSIS — M810 Age-related osteoporosis without current pathological fracture: Secondary | ICD-10-CM | POA: Diagnosis not present

## 2022-05-02 DIAGNOSIS — I700 Atherosclerosis of aorta: Secondary | ICD-10-CM | POA: Diagnosis not present

## 2022-05-09 DIAGNOSIS — R82998 Other abnormal findings in urine: Secondary | ICD-10-CM | POA: Diagnosis not present

## 2022-05-09 DIAGNOSIS — C50211 Malignant neoplasm of upper-inner quadrant of right female breast: Secondary | ICD-10-CM | POA: Diagnosis not present

## 2022-05-09 DIAGNOSIS — Z1339 Encounter for screening examination for other mental health and behavioral disorders: Secondary | ICD-10-CM | POA: Diagnosis not present

## 2022-05-09 DIAGNOSIS — C3491 Malignant neoplasm of unspecified part of right bronchus or lung: Secondary | ICD-10-CM | POA: Diagnosis not present

## 2022-05-09 DIAGNOSIS — Z853 Personal history of malignant neoplasm of breast: Secondary | ICD-10-CM | POA: Diagnosis not present

## 2022-05-09 DIAGNOSIS — Z0000 Encounter for general adult medical examination without abnormal findings: Secondary | ICD-10-CM | POA: Diagnosis not present

## 2022-05-09 DIAGNOSIS — Z96642 Presence of left artificial hip joint: Secondary | ICD-10-CM | POA: Diagnosis not present

## 2022-05-09 DIAGNOSIS — Z170 Estrogen receptor positive status [ER+]: Secondary | ICD-10-CM | POA: Diagnosis not present

## 2022-05-09 DIAGNOSIS — I700 Atherosclerosis of aorta: Secondary | ICD-10-CM | POA: Diagnosis not present

## 2022-05-09 DIAGNOSIS — Z1331 Encounter for screening for depression: Secondary | ICD-10-CM | POA: Diagnosis not present

## 2022-05-09 DIAGNOSIS — M5416 Radiculopathy, lumbar region: Secondary | ICD-10-CM | POA: Diagnosis not present

## 2022-05-09 DIAGNOSIS — E7800 Pure hypercholesterolemia, unspecified: Secondary | ICD-10-CM | POA: Diagnosis not present

## 2022-05-09 DIAGNOSIS — G4700 Insomnia, unspecified: Secondary | ICD-10-CM | POA: Diagnosis not present

## 2022-05-30 ENCOUNTER — Other Ambulatory Visit: Payer: Self-pay | Admitting: Cardiology

## 2022-07-11 ENCOUNTER — Other Ambulatory Visit: Payer: Self-pay

## 2022-07-19 ENCOUNTER — Other Ambulatory Visit: Payer: Self-pay

## 2022-07-25 ENCOUNTER — Telehealth: Payer: Self-pay | Admitting: *Deleted

## 2022-07-25 NOTE — Telephone Encounter (Signed)
CALLED PATIENT TO INFORM OF CT FOR 08-02-22- ARRIVAL TIME- 4 PM  @ [facility], PATIENT TO HAVE LABS DRAWN I-STAT IN RADIOLOGY AND PATIENT TO HAVE WATER ONLY- 4 HRS. PRIOR TO TEST, PATIENT TO RECEIVE RESULTS FROM ASHLYN BRUNING ON 08-04-22 @ 11 AM VIA TELEPHONE, SPOKE WITH PATIENT AND SHE IS AWARE OF THESE APPTS. AND THE INSTRUCTIONS

## 2022-08-02 ENCOUNTER — Ambulatory Visit (HOSPITAL_COMMUNITY)
Admission: RE | Admit: 2022-08-02 | Discharge: 2022-08-02 | Disposition: A | Discharge status: Home / Self Care | Payer: Medicare Other | Source: Ambulatory Visit | Attending: Urology | Admitting: Urology

## 2022-08-02 DIAGNOSIS — C3490 Malignant neoplasm of unspecified part of unspecified bronchus or lung: Secondary | ICD-10-CM | POA: Diagnosis not present

## 2022-08-02 DIAGNOSIS — C3432 Malignant neoplasm of lower lobe, left bronchus or lung: Secondary | ICD-10-CM | POA: Diagnosis not present

## 2022-08-02 DIAGNOSIS — J90 Pleural effusion, not elsewhere classified: Secondary | ICD-10-CM | POA: Diagnosis not present

## 2022-08-02 DIAGNOSIS — R911 Solitary pulmonary nodule: Secondary | ICD-10-CM | POA: Diagnosis not present

## 2022-08-02 MED ORDER — IOHEXOL 300 MG/ML  SOLN
75.0000 mL | Freq: Once | INTRAMUSCULAR | Status: AC | PRN
Start: 2022-08-02 — End: 2022-08-02
  Administered 2022-08-02: 75 mL via INTRAVENOUS

## 2022-08-02 MED ORDER — SODIUM CHLORIDE (PF) 0.9 % IJ SOLN
INTRAMUSCULAR | Status: AC
  Filled 2022-08-02: qty 50

## 2022-08-03 ENCOUNTER — Encounter: Payer: Self-pay | Admitting: Urology

## 2022-08-03 ENCOUNTER — Telehealth: Payer: Self-pay

## 2022-08-03 NOTE — Telephone Encounter (Signed)
Voicemail left in reference to patient's 08/04/22 telephone appointment w/ Ashlyn Bruning PA-C. I left my extension [phone] and requested that patient return my call, so that I may complete the nursing portion of patient's telephone appointment.

## 2022-08-03 NOTE — Progress Notes (Signed)
Telephone appointment. I verified patient's identity and began nursing interview. Patient reports her usual, moderate SOB but is managing it. No other issues reported at this time.  Meaningful use complete.  Reminded patient of her 11:00am-08/04/22 telephone appointment w/ Ashlyn Bruning PA-C. I left my extension [phone] in case patient needs anything. Patient verbalized understanding.  Patient contact [phone] or [phone] (cell)

## 2022-08-04 ENCOUNTER — Ambulatory Visit
Admission: RE | Admit: 2022-08-04 | Discharge: 2022-08-04 | Disposition: A | Discharge status: Home / Self Care | Payer: Medicare Other | Source: Ambulatory Visit | Attending: Urology | Admitting: Urology

## 2022-08-04 DIAGNOSIS — C3432 Malignant neoplasm of lower lobe, left bronchus or lung: Secondary | ICD-10-CM | POA: Diagnosis not present

## 2022-08-04 DIAGNOSIS — C3491 Malignant neoplasm of unspecified part of right bronchus or lung: Secondary | ICD-10-CM

## 2022-08-04 DIAGNOSIS — C342 Malignant neoplasm of middle lobe, bronchus or lung: Secondary | ICD-10-CM | POA: Diagnosis not present

## 2022-08-04 NOTE — Progress Notes (Signed)
[name] French  Radiation Oncology         [phone] ________________________________  Name: [name] French MRN: [id number]  Date: 08/04/2022  DOB: Jul 11, 1926  Post Treatment Note  CC: Tisovec, Fransico Him, MD  Tisovec, Fransico Him, MD  Diagnosis:   88 y.o. woman with newly diagnosed synchronous NSCLC, adenocarcinoma with a 6 cm LLL mass extending into the left hilum (stage IIB, T3N0M0) and a 2.4 cm RML mass (stage IB T2N0).      Interval Since Last Radiation:  4.5 years 11/28/17 - 01/18/18:  66 Gy directed to the LLL and RML lung lesions in 33 fractions of 2 Gy each.  Narrative:  I spoke with the patient to conduct her routine scheduled 6 month follow up visit to review her recent follow-up CT chest results via telephone to spare the patient unnecessary potential exposure in the healthcare setting during the current COVID-19 pandemic.  The patient was notified in advance and gave permission to proceed with this visit format. Her most recent CT Chest from 08/02/22 shows a stable appearance of the presumed post radiation changes in the inferomedial left hemithorax with dense, essentially complete consolidation and fibrosis of the left lower lobe, volume loss of the left hemithorax, and a stable, moderate loculated left pleural effusion. Additional bandlike scarring and fibrosis of the anterior right upper lobe, is somewhat increased compared to prior examination with an unchanged appearance of small pulmonary nodules in the right lower lobe and anterior right middle lobe.  No new nodules or findings to suggest disease recurrence or progression. She did not receive any systemic treatment for the lung cancer, as the feeling was that concurrent chemoradiation would be too toxic for the patient due to her age.  However, molecular studies were ordered to assess for a potential role of immunotherapy if she demonstrated PD1 expression greater than 50% but her molecular studies returned showing only a very low  expression of PD1 at 5%.  There were no other identifiable mutations noted.  She completed a 6.5 week course of radiotherapy which she tolerated relatively well and has remained in observation with Dr. Lindi Adie.   Unfortunately, she was found to have a recurrence of her right breast cancer with a 4.2 cm mass in the right breast detected on diagnostic mammography on December 23, 2019 with confirmation of a grade 2 invasive lobular carcinoma with LCIS, ER/PR positive, and HER-2 neu positive via right breast biopsy performed on 01/03/2020.  Fortunately, there was no evidence of distant metastatic disease on CT C/A/P 01/17/2020.  She had a lumpectomy with Dr. Ninfa Linden on 02/06/20 and has done very well with no evidence of recurrence on follow-up mammograms.  In summary, she has a remote history of breast cancer diagnosed and treated in 2014, an was initially seen in consult on 11/15/18 at the request of Dr. Lindi Adie for newly diagnosed non-small cell lung cancer and incidental finding of meningioma. She presented to her PCP in October 2018 with persistent dry cough that led to an isolated episode of hemoptysis. She denied further episodes of hemoptysis since that time. She presented to her PCP for evaluation and was being worked up for possible pneumonia with a chest x-ray and a CT scan of the chest. CT done on 10/11/2017 showed a 6.0 cm LLL lung mass with additional lung nodules, including a 2.4 cm pulmonary nodule in the RML as well as 0.3 cm satellite nodules. There was also a hypodense 0.4 cm RLL lesion. A PET CT scan on  112/18 demonstrated a hypermetabolic LLL mass extending into the left hilum with SUV of 26.8 (stage IIb, T3N0M0) and a mildly hypermetabolic RML mass with SUV max of 1.9 (stage IB T2N0).  There were no hypermetabolic hilar or mediastinal nodes.  She was evaluated with Dr. Roxan Hockey and underwent bilateral navigational bronchoscopy with biopsies of the lung masses on 11/01/17 which revealed bilateral  non-small cell lung cancer, poorly differentiated adenocarcinoma.    An MRI brain was performed on 10/20/2017 for disease staging and incidentally showed a right inferior parietal convexity meningioma measuring 2.9 x 2.7 x 1.4 cm. There was no evidence of metastatic disease. Her imaging was reviewed at the multidisciplinary brain conference and consensus recommendation was to repeat a scan in 3 months to assess for progression/enlargement. Repeat brain MRI was performed on 01/24/2018 demonstrating a persistent right temporoparietal meningioma unchanged in size with only mild vasogenic edema which appeared improved from her previous study.  There was no evidence of metastatic disease to the brain.  This study was again reviewed at the multidisciplinary brain conference on 01/29/2018 and consensus recommendation was to only repeat brain imaging if the patient develops symptoms.                       On review of systems, the patient states that she is doing well overall. Currently, she denies orthopnea, chest pain, dysphagia, fever, chills, cough or hemoptysis. She has persistent baseline shortness of breath with exertion and fatigue which has remained unchanged since 10/2017,  increased with activity but stable over the past 2 years.  She denies dyspnea at rest and reports that the shortness of breath with exertion resolves quickly with rest.  She has continued to tolerate the dose adjusted metoprolol for tachycardia and is followed with Dr. Peter Martinique. She denies headaches, changes in visual or auditory acuity, difficulty with speech or word finding, imbalance, tinnitus, tremor or seizure activity.    ALLERGIES:  is allergic to other, shellfish allergy, codeine, hydrocodone, and neosporin [neomycin-bacitracin zn-polymyx].  Meds: Current Outpatient Medications  Medication Sig Dispense Refill   acetaminophen (TYLENOL) 500 MG tablet Take 1,000 mg by mouth every 6 (six) hours as needed for moderate pain or  headache.     Artificial Tear Solution (BION TEARS OP) Place 1-2 drops into both eyes as needed (dry eyes).      aspirin 81 MG tablet Take 81 mg by mouth daily.     B Complex-C-Folic Acid (SUPER B COMPLEX/FA/VIT C) TABS Take by mouth.     cetirizine (ZYRTEC) 10 MG tablet Take 10 mg by mouth daily.     Cholecalciferol (VITAMIN D-3) 1000 UNITS CAPS Take 1,000 Units daily by mouth.      doxylamine, Sleep, (UNISOM) 25 MG tablet Take 25 mg at bedtime by mouth.     famotidine (PEPCID) 20 MG tablet Take 20 mg by mouth daily.     Glucosamine-Chondroit-MSM-C-Mn CAPS Take 1 capsule by mouth daily.     metoprolol tartrate (LOPRESSOR) 25 MG tablet TAKE ONE TABLET TWICE DAILY 180 tablet 3   nitroGLYCERIN (NITROSTAT) 0.4 MG SL tablet Place 0.4 mg under the tongue every 5 (five) minutes as needed for chest pain.     pravastatin (PRAVACHOL) 40 MG tablet Take 40 mg daily by mouth.      Probiotic Product (ALIGN) 4 MG CAPS Take 4 mg daily by mouth.     zolpidem (AMBIEN) 5 MG tablet Take 5 mg by mouth at bedtime as needed.  No current facility-administered medications for this encounter.    Physical Findings:  vitals were not taken for this visit.  Pain Assessment Pain Score: 0-No pain/10 Unable to assess due to telephone follow up visit format.  Lab Findings: Lab Results  Component Value Date   WBC 10.1 07/26/2021   HGB 14.0 07/26/2021   HCT 42.2 07/26/2021   MCV 85.8 07/26/2021   PLT 201 07/26/2021     Radiographic Findings:  08/02/22: EXAM: CT CHEST WITH CONTRAST   TECHNIQUE: Multidetector CT imaging of the chest was performed during intravenous contrast administration.   RADIATION DOSE REDUCTION: This exam was performed according to the departmental dose-optimization program which includes automated exposure control, adjustment of the mA and/or kV according to patient size and/or use of iterative reconstruction technique.  CONTRAST:  15mL OMNIPAQUE IOHEXOL 300 MG/ML  SOLN    COMPARISON:  02/01/2021   FINDINGS: Cardiovascular: Heart size appears within normal limits. No pericardial effusion. The aortic atherosclerosis and coronary artery calcifications. Signs chronic embolus within the left lower lobe pulmonary artery which appears unchanged in appearance compared with 01/28/2022 and is favored to represent sequelae of chronic left lower lobe consolidation and volume loss.   Mediastinum/Nodes: Normal appearance of the thyroid gland. The trachea appears patent and is midline. Normal appearance of the esophagus.   No enlarged axillary, supraclavicular, mediastinal, or hilar adenopathy.   Lungs/Pleura: There is a moderate loculated left pleural effusion which appears unchanged in volume compared with the previous exam.   Dense low-attenuation consolidation involving the entire left lower lobe appears stable in the interval.   Left upper lobe perihilar masslike architectural distortion and fibrosis is unchanged in the interval.   Nodular pleural thickening with calcifications overlying the lateral left upper lobe is unchanged, image 27/5.   Soft tissue thickening along the minor fissure of the right lung is again noted extending into the right hilum, image 66/7. This measures 8 mm in maximum thickness, image 66/7. On the previous exam this measured the same.   Stable subpleural nodule overlying the anterior right middle lobe measuring 4 mm, image 82/5.   Subpleural nodule overlying the posterior right lower lobe is unchanged measuring 4 mm, image 107/5. No new pulmonary nodule or mass identified.   Upper Abdomen: No acute abnormality. Stable appearance of left adrenal gland nodule measuring 1.3 by 0.9 cm, image 152/2.   Musculoskeletal: No aggressive lytic or sclerotic bone lesions.   IMPRESSION: 1. Stable CT of the chest. Unchanged post treatment appearance of the left chest with dense, essentially complete consolidation of the left lower  lobe with volume loss of the left hemithorax. 2. Unchanged appearance of loculated left pleural effusion 3. Stable small pulmonary nodules, likely benign and incidental. 4. 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 5. Aortic Atherosclerosis (ICD10-I70.0). Electronically Signed By: Kerby Moors M.D. On: 08/02/2022 16:43   Impression/Plan: 1. 88 y.o. female with synchronous NSCLC, adenocarcinoma with a 6 cm LLL mass extending into the left hilum (stage IIB, T3N0M0) and a 2.4 cm RML mass (stage IB T2N0).   She remains stable both clinically and radiographically with regards to the lung cancer.  Her most recent CT Chest from 08/02/22 shows a stable appearance of presumed post radiation changes in the inferomedial left hemithorax with dense, essentially complete consolidation and fibrosis of the left lower lobe, volume loss of  the left hemithorax, and a stable, moderate loculated left pleural effusion. Additional bandlike scarring and fibrosis of the anterior right upper lobe, is somewhat increased compared to prior examination with an unchanged appearance of small pulmonary nodules in the right lower lobe and anterior right middle lobe.  No new nodules or findings to suggest disease recurrence or progression. We will continue with serial chest CT scans every 6 months for continued close monitoring and I will call her following each scan to review results and recommendations.  Once we reach the 5 year interval, we will transition to annual, low dose screening chest CT scans. She knows to call immediately for any progressive SOB or concerning symptoms in the interim. She had a therapeutic thoracentesis in June 2021 but is reluctant to pursue further thoracentesis for pleural effusion unless felt absolutely necessary since she remains  relatively asymptomatic.  We again discussed that should there be any progression in fluid accumulation or progressive shortness of breath with less exertion, we would need to consider moving forward with pulmonary evaluation and/or thoracentesis for therapeutic and diagnostic purposes.  She is in agreement with the stated plan.  2.         Meningioma. This was an incidental finding and patient is currently without neurologic or systemic complaints.  It does not appear to be causing a mass effect on any structures in the brain and remains stable on MRI brain from 01/24/2018. The consensus at brain conference from 01/29/2018 is to only repeat brain imaging if she becomes symptomatic.  Otherwise we will just follow this expectantly and only repeat further brain imaging should she develop concerning symptoms. She is in agreement with and is comfortable with this plan.  3. Tachycardia.  This is being managed by her cardiologist, Dr. Peter Martinique.  4. Recurrent right breast cancer. She was found to have a recurrence of her right breast cancer with a 4.2 cm mass in the right breast detected on diagnostic mammography on December 23, 2019 with confirmation of a grade 2 invasive lobular carcinoma with LCIS, ER/PR positive, and HER-2 neu positive via right breast biopsy performed on 01/03/2020.  Fortunately, there was no evidence of distant metastatic disease on CT C/A/P 01/17/2020.  She had a lumpectomy with Dr. Ninfa Linden on 02/06/20 but adamantly not interested in adjuvant radiotherapy.  She will continue in routine follow up for continued systemic disease management with Dr. Lindi Adie.      Nicholos Johns, PA-C

## 2022-08-16 DIAGNOSIS — Z961 Presence of intraocular lens: Secondary | ICD-10-CM | POA: Diagnosis not present

## 2022-08-16 DIAGNOSIS — H524 Presbyopia: Secondary | ICD-10-CM | POA: Diagnosis not present

## 2022-08-26 DIAGNOSIS — Z23 Encounter for immunization: Secondary | ICD-10-CM | POA: Diagnosis not present

## 2022-09-05 ENCOUNTER — Ambulatory Visit
Admission: RE | Admit: 2022-09-05 | Discharge: 2022-09-05 | Disposition: A | Discharge status: Home / Self Care | Payer: Medicare Other | Source: Ambulatory Visit | Attending: Hematology and Oncology | Admitting: Hematology and Oncology

## 2022-09-05 ENCOUNTER — Other Ambulatory Visit: Payer: Self-pay | Admitting: Hematology and Oncology

## 2022-09-05 DIAGNOSIS — N6310 Unspecified lump in the right breast, unspecified quadrant: Secondary | ICD-10-CM

## 2022-09-05 DIAGNOSIS — Z9889 Other specified postprocedural states: Secondary | ICD-10-CM

## 2022-09-05 DIAGNOSIS — N6489 Other specified disorders of breast: Secondary | ICD-10-CM | POA: Diagnosis not present

## 2022-09-05 DIAGNOSIS — R922 Inconclusive mammogram: Secondary | ICD-10-CM | POA: Diagnosis not present

## 2022-10-24 DIAGNOSIS — Z23 Encounter for immunization: Secondary | ICD-10-CM | POA: Diagnosis not present

## 2022-11-02 DIAGNOSIS — H3500 Unspecified background retinopathy: Secondary | ICD-10-CM | POA: Diagnosis not present

## 2022-11-02 DIAGNOSIS — H353212 Exudative age-related macular degeneration, right eye, with inactive choroidal neovascularization: Secondary | ICD-10-CM | POA: Diagnosis not present

## 2022-11-07 DIAGNOSIS — H43813 Vitreous degeneration, bilateral: Secondary | ICD-10-CM | POA: Diagnosis not present

## 2022-11-07 DIAGNOSIS — H353112 Nonexudative age-related macular degeneration, right eye, intermediate dry stage: Secondary | ICD-10-CM | POA: Diagnosis not present

## 2022-11-07 DIAGNOSIS — H353221 Exudative age-related macular degeneration, left eye, with active choroidal neovascularization: Secondary | ICD-10-CM | POA: Diagnosis not present

## 2022-11-17 DIAGNOSIS — M5416 Radiculopathy, lumbar region: Secondary | ICD-10-CM | POA: Diagnosis not present

## 2022-11-17 DIAGNOSIS — Z96642 Presence of left artificial hip joint: Secondary | ICD-10-CM | POA: Diagnosis not present

## 2022-11-17 DIAGNOSIS — Z170 Estrogen receptor positive status [ER+]: Secondary | ICD-10-CM | POA: Diagnosis not present

## 2022-11-17 DIAGNOSIS — E7800 Pure hypercholesterolemia, unspecified: Secondary | ICD-10-CM | POA: Diagnosis not present

## 2022-11-17 DIAGNOSIS — Z853 Personal history of malignant neoplasm of breast: Secondary | ICD-10-CM | POA: Diagnosis not present

## 2022-11-17 DIAGNOSIS — I700 Atherosclerosis of aorta: Secondary | ICD-10-CM | POA: Diagnosis not present

## 2022-11-17 DIAGNOSIS — C50211 Malignant neoplasm of upper-inner quadrant of right female breast: Secondary | ICD-10-CM | POA: Diagnosis not present

## 2022-11-17 DIAGNOSIS — H269 Unspecified cataract: Secondary | ICD-10-CM | POA: Diagnosis not present

## 2022-11-17 DIAGNOSIS — G4700 Insomnia, unspecified: Secondary | ICD-10-CM | POA: Diagnosis not present

## 2022-11-17 DIAGNOSIS — M810 Age-related osteoporosis without current pathological fracture: Secondary | ICD-10-CM | POA: Diagnosis not present

## 2022-11-17 DIAGNOSIS — C3491 Malignant neoplasm of unspecified part of right bronchus or lung: Secondary | ICD-10-CM | POA: Diagnosis not present

## 2022-12-05 DIAGNOSIS — H353211 Exudative age-related macular degeneration, right eye, with active choroidal neovascularization: Secondary | ICD-10-CM | POA: Diagnosis not present

## 2022-12-22 ENCOUNTER — Telehealth: Payer: Self-pay | Admitting: Cardiology

## 2022-12-22 ENCOUNTER — Ambulatory Visit: Payer: Medicare Other | Attending: Cardiology | Admitting: Cardiology

## 2022-12-22 ENCOUNTER — Encounter: Payer: Self-pay | Admitting: Cardiology

## 2022-12-22 VITALS — BP 128/82 | HR 105 | Ht 65.0 in | Wt 137.2 lb

## 2022-12-22 DIAGNOSIS — I2584 Coronary atherosclerosis due to calcified coronary lesion: Secondary | ICD-10-CM | POA: Insufficient documentation

## 2022-12-22 DIAGNOSIS — C50919 Malignant neoplasm of unspecified site of unspecified female breast: Secondary | ICD-10-CM | POA: Diagnosis not present

## 2022-12-22 DIAGNOSIS — I2510 Atherosclerotic heart disease of native coronary artery without angina pectoris: Secondary | ICD-10-CM | POA: Diagnosis not present

## 2022-12-22 DIAGNOSIS — R0609 Other forms of dyspnea: Secondary | ICD-10-CM | POA: Insufficient documentation

## 2022-12-22 DIAGNOSIS — C3490 Malignant neoplasm of unspecified part of unspecified bronchus or lung: Secondary | ICD-10-CM | POA: Diagnosis not present

## 2022-12-22 MED ORDER — FUROSEMIDE 20 MG PO TABS: 20.0000 mg | ORAL_TABLET | Freq: Every day | ORAL | 3 refills | Status: DC

## 2022-12-22 NOTE — Addendum Note (Signed)
Addended by: Kathyrn Lass on: 12/22/2022 03:51 PM   Modules accepted: Orders

## 2022-12-22 NOTE — Patient Instructions (Addendum)
We will check lab work today and order a chest X ray  We will try lasix 20 mg daily.   Appointment 1 month   Friday 01/27/23 at 10:40 am

## 2022-12-22 NOTE — Progress Notes (Signed)
Cardiology Office Note:    Date:  12/22/2022   ID:  [name] French, DOB 1926-10-25, MRN [id number]  PCP:  Haywood Pao, MD   [facility] Cardiologist:  Jeydi Klingel Martinique, MD      Referring MD: Haywood Pao, MD    History of Present Illness:    [name] French is a 89 y.o. female seen for evaluation of increased SOB. She has a hx of secondary hypertension, coronary artery calcification, and tachycardia.  Her PMH also includes lung cancer with radiation therapy.  She was not felt to be a candidate for chemotherapy or surgery.  She was admitted overnight 12/25/2017 for evaluation of atypical chest pain.  Her CT chest was negative for PE.  She was noted to have left pleural effusion.  Her troponins and EKG were unremarkable.  Her echocardiogram was normal.  She was noted to have PACs and there was mention of a run of NSVT.  She was started on metoprolol.  Her chest pain was felt to be esophageal in nature.  She was seen by GI 01/25/2018 for evaluation of abnormal colon finding on her PET scan.  Follow-up evaluation was never completed.     She underwent nuclear stress test 4/19 for chest pain.  Her exam was unremarkable.  Subsequent CT scans showed left lung collapse with a large left effusion.   She was admitted to the hospital 6/21 with respiratory failure and severe hyponatremia.  Her hyponatremia improved with tolvaptan.  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  She is placed on fluid restriction.  She was noted to have bilateral pleural effusions.  She underwent thoracentesis which produced transudative fluid.  An echocardiogram at that time showed moderate pulmonary hypertension, normal LV function, normal RV function.  She was transferred to inpatient rehab for 2 weeks after.    She presented to the emergency department on 07/26/2021 and was discharged on 07/27/2021 with complaints of chest discomfort.  Her troponins were 5 and 5, her chest pain resolved,  her EKG showed sinus rhythm 99 bpm.  Cardiology signed off without further plans for ischemic evaluation she was discharged in stable condition.   She contacted the nurse triage line on 08/16/2021 and reported lower extremity swelling and increased shortness of breath.   She presented to the clinic 08/19/21 for evaluation and stated she had noticed increased work of breathing and lower extremity swelling x1 month. She was placed on lasix 40 mg daily. Her lab work remained stable on 08/24/2021 and her weight log showed significant improvement. Repeat Echo in Sept 2022 showed normal LV function. Mild to mod MR, moderate AI, moderate Pulmonary HTN. On last follow up lasix was stopped.  CT chest in August was unchanged with chronic complete consolidation of the LLL and left pleural effusion.   She has chronic SOB. Over the past week she notes marked increase in SOB with exertion. OK when she is at rest but with any activity she is SOB and has marked fatigue. No cough. No chest pain. Some swelling in right ankle. Weight has gone up 3 lbs this week.    Past Medical History:  Diagnosis Date   Allergic rhinitis    Anemia    during pregnancy   Arthritis    Breast cancer ([place]) 05/08/13   right upper inner, invasive mammary   Breast cancer, right breast ([place]) 04/16/2013   Underwent lumpectomy on 11/06/13. 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   Use of letrozole ([place])    neoadjuvant antiestrogen therapy with letrozole 2.5 mg daily x 7 monhts    Past Surgical History:  Procedure  Laterality Date   BREAST BIOPSY Left 1960   lt br bx/benign   BREAST LUMPECTOMY Right    BREAST LUMPECTOMY Right 02/10/2020   Procedure: RIGHT BREAST LUMPECTOMY;  Surgeon: Coralie Keens, MD;  Location: [facility];  Service: General;  Laterality: Right;   BREAST LUMPECTOMY WITH NEEDLE LOCALIZATION Bilateral 11/06/2013   Procedure: BREAST LUMPECTOMY WITH NEEDLE LOCALIZATION;  Surgeon: Haywood Lasso, MD;  Location: [facility];  Service: General;  Laterality: Bilateral;   COLONOSCOPY     EYE SURGERY Bilateral    both cataracts   MOHS SURGERY Right    nose basal/squamous   TOTAL HIP ARTHROPLASTY Left 08/31/2018   Procedure: LEFT TOTAL HIP ARTHROPLASTY ANTERIOR APPROACH;  Surgeon: Mcarthur Rossetti, MD;  Location: [facility];  Service: Orthopedics;  Laterality: Left;   VIDEO BRONCHOSCOPY WITH ENDOBRONCHIAL NAVIGATION N/A 11/01/2017   Procedure: VIDEO BRONCHOSCOPY WITH ENDOBRONCHIAL NAVIGATION;  Surgeon: [name], MD;  Location: [facility];  Service: Thoracic;  Laterality: N/A;   WRIST SURGERY  1990   lt    Current Medications: Current Meds  Medication Sig   acetaminophen (TYLENOL) 500 MG tablet Take 1,000 mg by mouth every 6 (six) hours as needed for moderate pain or headache.   Artificial Tear Solution (BION TEARS OP) Place 1-2 drops into both eyes as needed (dry eyes).    aspirin 81 MG tablet Take 81 mg by mouth daily.   B Complex-C-Folic Acid (SUPER B COMPLEX/FA/VIT C) TABS Take by mouth.   cetirizine (ZYRTEC) 10 MG tablet Take 10 mg by mouth daily.   Cholecalciferol (VITAMIN D-3) 1000 UNITS CAPS Take 1,000 Units daily by mouth.    doxylamine, Sleep, (UNISOM) 25 MG tablet Take 25 mg at bedtime by mouth.   famotidine (PEPCID) 20 MG tablet Take 20 mg by mouth daily.   Glucosamine-Chondroit-MSM-C-Mn CAPS Take 1 capsule by mouth daily.   metoprolol tartrate (LOPRESSOR) 25 MG tablet TAKE ONE TABLET TWICE DAILY (Patient taking differently: Take 1(25mg ) tablet  morning 1/2(12.5mg ) tablet at bedtime.)   nitroGLYCERIN (NITROSTAT) 0.4 MG SL tablet Place 0.4 mg under the tongue every 5 (five) minutes as needed for chest pain.   pravastatin (PRAVACHOL) 40 MG tablet Take 40 mg daily by mouth.    Probiotic Product (ALIGN) 4 MG CAPS Take 4 mg daily by mouth.   zolpidem (AMBIEN) 5 MG tablet Take 5 mg by mouth at bedtime as needed.     Allergies:   Other, Shellfish allergy, Codeine, Hydrocodone, and Neosporin [neomycin-bacitracin zn-polymyx]   Social History   Socioeconomic History   Marital status: Divorced    Spouse name: Not on file   Number of children: 2   Years of education: Not on file  Highest education level: Not on file  Occupational History   Occupation: Retired  Tobacco Use   Smoking status: Former    Packs/day: 0.50    Years: 25.00    Total pack years: 12.50    Types: Cigarettes    Quit date: 12/19/1978    Years since quitting: 44.0   Smokeless tobacco: Never  Vaping Use   Vaping Use: Never used  Substance and Sexual Activity   Alcohol use: Yes    Comment: "wine usually, no taste in the last in past few weeks- (02/07/2020)   Drug use: No   Sexual activity: Not Currently    Comment: menarche at age14, menopause in 43, HRT x 34, age at first live birth 68's, G74 P3  Other Topics Concern   Not on file  Social History Narrative   Not on file   Social Determinants of Health   Financial Resource Strain: Not on file  Food Insecurity: Not on file  Transportation Needs: Not on file  Physical Activity: Not on file  Stress: Not on file  Social Connections: Not on file     Family History: The patient's family history includes Heart disease in her mother; Lung cancer in her sister; Prostate cancer in her father.  ROS:   Please see the history of present illness.     All other systems reviewed and are negative.   Risk Assessment/Calculations:           Physical Exam:    VS:  BP 128/82   Pulse (!) 105   Ht 5\' 5"  (1.651  m)   Wt 137 lb 3.2 oz (62.2 kg)   LMP  (Approximate)   SpO2 95%   BMI 22.83 kg/m     Wt Readings from Last 3 Encounters:  12/22/22 137 lb 3.2 oz (62.2 kg)  03/22/22 133 lb 4.8 oz (60.5 kg)  01/31/22 131 lb 6.4 oz (59.6 kg)     GEN:  Well nourished, well developed in no acute distress HEENT: Normal NECK: No JVD; No carotid bruits LYMPHATICS: No lymphadenopathy CARDIAC: Tachycardic, no murmurs, rubs, gallops RESPIRATORY:  decreased BS throughout left lower lung fields. Otherwise clear.  ABDOMEN: Soft, non-tender, non-distended MUSCULOSKELETAL: 1+ right ankle edema; No deformity  SKIN: Warm and dry NEUROLOGIC:  Alert and oriented x 3 PSYCHIATRIC:  Normal affect    EKGs/Labs/Other Studies Reviewed:    The following studies were reviewed today: Nuclear stress test 03/23/2018 Nuclear stress EF: 56%. Clinically and electrically negative for ischemia Normal perfusiion No ischemia or scar This is a low risk study.       Echocardiogram 05/29/2020 IMPRESSIONS     1. Left ventricular ejection fraction, by estimation, is 60 to 65%. The  left ventricle has normal function. The left ventricle has no regional  wall motion abnormalities. Left ventricular diastolic parameters are  consistent with Grade II diastolic  dysfunction (pseudonormalization).   2. Right ventricular systolic function is normal. The right ventricular  size is mildly enlarged. There is moderately elevated pulmonary artery  systolic pressure. The estimated right ventricular systolic pressure is  50.3 mmHg.   3. Right atrial size was moderately dilated.   4. The mitral valve is normal in structure. Mild mitral valve  regurgitation. No evidence of mitral stenosis.   5. Tricuspid valve regurgitation is moderate.   6. The aortic valve is normal in structure. Aortic valve regurgitation is  mild. No aortic stenosis is present.   7. The inferior vena cava is normal in  size with greater than 50%  respiratory  variability, suggesting right atrial pressure of 3 mmHg.  Echo 09/09/21: IMPRESSIONS     1. Left ventricular ejection fraction, by estimation, is 65 to 70%. The  left ventricle has normal function. The left ventricle has no regional  wall motion abnormalities. Left ventricular diastolic parameters are  indeterminate. There is the  interventricular septum is flattened in systole, consistent with right  ventricular pressure overload.   2. Right ventricular systolic function is mildly reduced. The right  ventricular size is mildly enlarged. There is moderately elevated  pulmonary artery systolic pressure. The estimated right ventricular  systolic pressure is 81.1 mmHg.   3. Right atrial size was mildly dilated.   4. The mitral valve is normal in structure. Mild to moderate mitral valve  regurgitation. No evidence of mitral stenosis.   5. The aortic valve is grossly normal. Aortic valve regurgitation is  moderate. Mild aortic valve sclerosis is present, with no evidence of  aortic valve stenosis.   6. The inferior vena cava is normal in size with greater than 50%  respiratory variability, suggesting right atrial pressure of 3 mmHg.   Comparison(s): 05/29/20 60-65%, valve disease similar to slightly  progressed on current study without severe disease.   IMPRESSION: 1. Stable CT of the chest. Unchanged post treatment appearance of the left chest with dense, essentially complete consolidation of the left lower lobe with volume loss of the left hemithorax. 2. Unchanged appearance of loculated left pleural effusion 3. Stable small pulmonary nodules, likely benign and incidental. 4. 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 5. Aortic Atherosclerosis (ICD10-I70.0).     Electronically Signed   By: Kerby Moors M.D.    On: 08/02/2022 16:43    EKG: not done today.     Recent Labs: 01/28/2022: Creatinine, Ser 0.80  Recent Lipid Panel    Component Value Date/Time   CHOL 158 12/26/2017 0025   TRIG 167 (H) 12/26/2017 0025   HDL 51 12/26/2017 0025   CHOLHDL 3.1 12/26/2017 0025   VLDL 33 12/26/2017 0025   LDLCALC 74 12/26/2017 0025   Dated 04/15/21: cholesterol 174, triglycerides 71, HDL 71, LDL 89.    ASSESSMENT & PLAN    SOB-mild right ankle edema. ? Diastolic CHF versus anemia, PE, progressive CA Heart healthy low-sodium diet Echocardiogram as noted above with normal LV function. Will update lab work including CMET, CBC, D dimer and BNP Check CXR Will empirically start lasix 20 mg daily   2. Essential hypertension- Well-controlled  Continue metoprolol   3. Sinus tachycardia-heart rate today 106 bpm.  chronic  Continue metoprolol to 25 mg a.m. and continue 12.5 mg in the p.m.   4. Coronary calcium-no chest pain  Nuclear stress test showed normal perfusion. Continue aspirin, metoprolol, nitroglycerin, pravastatin   5. Lung cancer-status postradiation therapy.  Follow-up CT showed stable findings this past August.  Noted to have chronic left pleural effusion and LLL consolidation   6. Breast cancer-completed chemotherapy.  There is a recurrent breast CA on the right. Follows with oncology       Disposition: Follow-up post above studies.       Medication Adjustments/Labs and Tests Ordered: Current medicines are reviewed at length with the patient today.  Concerns regarding medicines are outlined above.  No orders of the defined types were placed in this  encounter.  No orders of the defined types were placed in this encounter.   Patient Instructions  We will check lab work today and order a chest X ray  We will try lasix 20 mg daily.      Signed, Chabely Norby Martinique, MD  12/22/2022 3:40 PM

## 2022-12-22 NOTE — Telephone Encounter (Signed)
Pt c/o Shortness Of Breath: STAT if SOB developed within the last 24 hours or pt is noticeably SOB on the phone  1. Are you currently SOB (can you hear that pt is SOB on the phone)? Yes, can hear over the phone   2. How long have you been experiencing SOB? Has increased in last week or so  3. Are you SOB when sitting or when up moving around? Mostly when moving around   4. Are you currently experiencing any other symptoms? Swollen right ankle   Wants an appt with Denyse Amass regarding this.

## 2022-12-22 NOTE — Telephone Encounter (Signed)
Received stat call from patient who states that she has had shortness of breath and also has had breathing problems for the last several years due to lung cancer. Patient reports that now shortness of breath has worsened and reports having no energy. Patient reports that right ankle has began to swell some but denies any swelling in the left leg. Patient denies any pain in the right calf and denies any discoloration. Patient denies any recent weight gain and denies any palpitations. Patient reports that she is just very short of breath with exertion and reports that she now does not have much energy. Patient would like to be seen. Scheduled patient to see Dr. Martinique today 12/22/21 at 3:30pm. Patient states that she will have someone bring her.

## 2022-12-23 ENCOUNTER — Ambulatory Visit
Admission: RE | Admit: 2022-12-23 | Discharge: 2022-12-23 | Disposition: A | Discharge status: Home / Self Care | Payer: Medicare Other | Source: Ambulatory Visit | Attending: Cardiology | Admitting: Cardiology

## 2022-12-23 ENCOUNTER — Encounter: Payer: Self-pay | Admitting: Hematology and Oncology

## 2022-12-23 ENCOUNTER — Other Ambulatory Visit: Payer: Self-pay

## 2022-12-23 DIAGNOSIS — C3490 Malignant neoplasm of unspecified part of unspecified bronchus or lung: Secondary | ICD-10-CM

## 2022-12-23 DIAGNOSIS — C50919 Malignant neoplasm of unspecified site of unspecified female breast: Secondary | ICD-10-CM

## 2022-12-23 DIAGNOSIS — R7989 Other specified abnormal findings of blood chemistry: Secondary | ICD-10-CM

## 2022-12-23 DIAGNOSIS — I2510 Atherosclerotic heart disease of native coronary artery without angina pectoris: Secondary | ICD-10-CM

## 2022-12-23 DIAGNOSIS — R0602 Shortness of breath: Secondary | ICD-10-CM | POA: Diagnosis not present

## 2022-12-23 DIAGNOSIS — R0609 Other forms of dyspnea: Secondary | ICD-10-CM

## 2022-12-26 ENCOUNTER — Encounter: Payer: Self-pay | Admitting: Hematology and Oncology

## 2022-12-26 LAB — BASIC METABOLIC PANEL
BUN/Creatinine Ratio: 14 (ref 12–28)
BUN: 10 mg/dL (ref 10–36)
CO2: 24 mmol/L (ref 20–29)
Calcium: 9.3 mg/dL (ref 8.7–10.3)
Chloride: 89 mmol/L — ABNORMAL LOW (ref 96–106)
Creatinine, Ser: 0.7 mg/dL (ref 0.57–1.00)
Glucose: 98 mg/dL (ref 70–99)
Potassium: 4.6 mmol/L (ref 3.5–5.2)
Sodium: 129 mmol/L — ABNORMAL LOW (ref 134–144)
eGFR: 80 mL/min/{1.73_m2} (ref >59)

## 2022-12-26 LAB — CBC WITH DIFFERENTIAL/PLATELET
Basophils Absolute: 0.1 10*3/uL (ref 0.0–0.2)
Basos: 1 %
EOS (ABSOLUTE): 0.2 10*3/uL (ref 0.0–0.4)
Eos: 3 %
Hematocrit: 39.6 % (ref 34.0–46.6)
Hemoglobin: 13.1 g/dL (ref 11.1–15.9)
Immature Grans (Abs): 0 10*3/uL (ref 0.0–0.1)
Immature Granulocytes: 0 %
Lymphocytes Absolute: 0.8 10*3/uL (ref 0.7–3.1)
Lymphs: 12 %
MCH: 28.8 pg (ref 26.6–33.0)
MCHC: 33.1 g/dL (ref 31.5–35.7)
MCV: 87 fL (ref 79–97)
Monocytes Absolute: 0.8 10*3/uL (ref 0.1–0.9)
Monocytes: 13 %
Neutrophils Absolute: 4.7 10*3/uL (ref 1.4–7.0)
Neutrophils: 71 %
Platelets: 238 10*3/uL (ref 150–450)
RBC: 4.55 x10E6/uL (ref 3.77–5.28)
RDW: 13.4 % (ref 11.7–15.4)
WBC: 6.6 10*3/uL (ref 3.4–10.8)

## 2022-12-26 LAB — D-DIMER, QUANTITATIVE: D-DIMER: 0.63 mg/L FEU — ABNORMAL HIGH (ref 0.00–0.49)

## 2022-12-26 LAB — BRAIN NATRIURETIC PEPTIDE: BNP: 538.7 pg/mL — ABNORMAL HIGH (ref 0.0–100.0)

## 2022-12-29 ENCOUNTER — Ambulatory Visit (HOSPITAL_COMMUNITY)
Admission: RE | Admit: 2022-12-29 | Discharge: 2022-12-29 | Disposition: A | Discharge status: Home / Self Care | Payer: Medicare Other | Source: Ambulatory Visit | Attending: Cardiology | Admitting: Cardiology

## 2022-12-29 ENCOUNTER — Encounter (HOSPITAL_COMMUNITY): Payer: Self-pay

## 2022-12-29 DIAGNOSIS — R0609 Other forms of dyspnea: Secondary | ICD-10-CM

## 2022-12-29 DIAGNOSIS — R7989 Other specified abnormal findings of blood chemistry: Secondary | ICD-10-CM

## 2022-12-29 DIAGNOSIS — R0602 Shortness of breath: Secondary | ICD-10-CM | POA: Diagnosis not present

## 2022-12-29 MED ORDER — SODIUM CHLORIDE (PF) 0.9 % IJ SOLN
INTRAMUSCULAR | Status: AC
  Filled 2022-12-29: qty 50

## 2022-12-29 MED ORDER — IOHEXOL 350 MG/ML SOLN
80.0000 mL | Freq: Once | INTRAVENOUS | Status: AC | PRN
  Administered 2022-12-29: 80 mL via INTRAVENOUS

## 2022-12-30 ENCOUNTER — Other Ambulatory Visit: Payer: Self-pay

## 2023-01-02 DIAGNOSIS — H353211 Exudative age-related macular degeneration, right eye, with active choroidal neovascularization: Secondary | ICD-10-CM | POA: Diagnosis not present

## 2023-01-12 ENCOUNTER — Telehealth: Payer: Self-pay | Admitting: *Deleted

## 2023-01-12 NOTE — Telephone Encounter (Signed)
CALLED PATIENT TO INFORM OF CT FOR 02-03-23- ARRIVAL TIME- 1:30 PM @ [facility], WATER ONLY - 4 HRS. PRIOR TO TEST, PATIENT TO RECEIVE RESULTS FROM ASHLYN BRUNING VIA TELEPHONE ON 02/08/23 @ 11:30 AM, LVM FOR A RETURN CALL

## 2023-01-18 NOTE — Progress Notes (Signed)
Cardiology Office Note:    Date:  01/27/2023   ID:  [name] French, DOB 1926-03-30, MRN [id number]  PCP:  Haywood Pao, MD   [facility] Cardiologist:  Oleda Borski Martinique, MD      Referring MD: Haywood Pao, MD    History of Present Illness:    [name] French is a 89 y.o. female seen for evaluation of increased SOB. She has a hx of secondary hypertension, coronary artery calcification, and tachycardia.  Her PMH also includes lung cancer with radiation therapy.  She was not felt to be a candidate for chemotherapy or surgery.  She was admitted overnight 12/25/2017 for evaluation of atypical chest pain.  Her CT chest was negative for PE.  She was noted to have left pleural effusion.  Her troponins and EKG were unremarkable.  Her echocardiogram was normal.  She was noted to have PACs and there was mention of a run of NSVT.  She was started on metoprolol.  Her chest pain was felt to be esophageal in nature.  She was seen by GI 01/25/2018 for evaluation of abnormal colon finding on her PET scan.  Follow-up evaluation was never completed.     She underwent nuclear stress test 4/19 for chest pain.  Her exam was unremarkable.  Subsequent CT scans showed left lung collapse with a large left effusion.   She was admitted to the hospital 6/21 with respiratory failure and severe hyponatremia.  Her hyponatremia improved with tolvaptan.  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  She is placed on fluid restriction.  She was noted to have bilateral pleural effusions.  She underwent thoracentesis which produced transudative fluid.  An echocardiogram at that time showed moderate pulmonary hypertension, normal LV function, normal RV function.  She was transferred to inpatient rehab for 2 weeks after.    She presented to the emergency department on 07/26/2021 and was discharged on 07/27/2021 with complaints of chest discomfort.  Her troponins were 5 and 5, her chest pain resolved,  her EKG showed sinus rhythm 99 bpm.  Cardiology signed off without further plans for ischemic evaluation she was discharged in stable condition.   She contacted the nurse triage line on 08/16/2021 and reported lower extremity swelling and increased shortness of breath.   She presented to the clinic 08/19/21 for evaluation and stated she had noticed increased work of breathing and lower extremity swelling x1 month. She was placed on lasix 40 mg daily. Her lab work remained stable on 08/24/2021 and her weight log showed significant improvement. Repeat Echo in Sept 2022 showed normal LV function. Mild to mod MR, moderate AI, moderate Pulmonary HTN. On last follow up lasix was stopped.  CT chest in August was unchanged with chronic complete consolidation of the LLL and left pleural effusion.   She has chronic SOB. When seen in Jan she notes marked increase in SOB with exertion. OK when she is at rest but with any activity she is SOB and has marked fatigue. No cough. No chest pain. Some swelling in right ankle. Weight has gone up 3 lbs this week.   CXR was stable. CT showed no acute PE. Suggested increase pulmonary edema and right effusion with chronic changes on the left. BNP was elevated at 538. She was started on lasix.   She states she has taken lasix every day for the past 2 weeks. Really can't tell much change in her breathing. Is urinating more. No  edema. Appetite is still poor and energy level is low. She has lost 10 lbs.    Past Medical History:  Diagnosis Date   Allergic rhinitis    Anemia    during pregnancy   Arthritis    Breast cancer ([facility]) 05/08/13   right upper inner, invasive mammary   Breast cancer, right breast ([facility]) 04/16/2013   Underwent lumpectomy on 11/06/13. 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   Use of letrozole ([place])    neoadjuvant antiestrogen therapy with letrozole 2.5 mg daily x 7 monhts    Past Surgical History:  Procedure Laterality Date   BREAST BIOPSY Left 1960   lt br bx/benign   BREAST LUMPECTOMY Right    BREAST LUMPECTOMY Right 02/10/2020   Procedure: RIGHT BREAST LUMPECTOMY;  Surgeon: Coralie Keens, MD;  Location: [facility];  Service: General;  Laterality: Right;   BREAST LUMPECTOMY WITH NEEDLE LOCALIZATION Bilateral 11/06/2013   Procedure: BREAST LUMPECTOMY WITH NEEDLE LOCALIZATION;  Surgeon: Haywood Lasso, MD;  Location: [facility];  Service: General;  Laterality: Bilateral;   COLONOSCOPY     EYE SURGERY Bilateral    both cataracts   MOHS SURGERY Right    nose basal/squamous   TOTAL HIP ARTHROPLASTY Left 08/31/2018   Procedure: LEFT TOTAL HIP ARTHROPLASTY ANTERIOR APPROACH;  Surgeon: Mcarthur Rossetti, MD;  Location: [facility];  Service: Orthopedics;  Laterality: Left;   VIDEO BRONCHOSCOPY WITH ENDOBRONCHIAL NAVIGATION N/A 11/01/2017   Procedure: VIDEO BRONCHOSCOPY WITH ENDOBRONCHIAL NAVIGATION;  Surgeon: [name], MD;  Location: [facility];  Service: Thoracic;  Laterality: N/A;   WRIST SURGERY  1990   lt    Current Medications: Current Meds  Medication Sig   acetaminophen (TYLENOL) 500 MG tablet Take 1,000 mg by mouth every 6 (six) hours as needed for moderate pain or headache.   Artificial Tear Solution (BION TEARS OP) Place 1-2 drops into both eyes as needed (dry eyes).    aspirin 81 MG tablet Take 81 mg by mouth daily.   B Complex-C-Folic Acid (SUPER B COMPLEX/FA/VIT C) TABS Take by mouth.   cetirizine (ZYRTEC) 10 MG tablet Take 10 mg by mouth daily.   Cholecalciferol  (VITAMIN D-3) 1000 UNITS CAPS Take 1,000 Units daily by mouth.    doxylamine, Sleep, (UNISOM) 25 MG tablet Take 25 mg at bedtime by mouth.   famotidine (PEPCID) 20 MG tablet Take 20 mg by mouth daily.   furosemide (LASIX) 20 MG tablet Take 2 tablests ( 40 mg ) every morning   Glucosamine-Chondroit-MSM-C-Mn CAPS Take 1 capsule by mouth daily.   metoprolol tartrate (LOPRESSOR) 25 MG tablet TAKE ONE TABLET TWICE DAILY (Patient taking differently: Take 1(25mg ) tablet morning 1/2(12.5mg ) tablet at bedtime.)   nitroGLYCERIN (NITROSTAT) 0.4 MG SL tablet Place 0.4 mg under the tongue every 5 (five) minutes as needed for chest pain.   pravastatin (PRAVACHOL) 40 MG  tablet Take 40 mg daily by mouth.    Probiotic Product (ALIGN) 4 MG CAPS Take 4 mg daily by mouth.   zolpidem (AMBIEN) 5 MG tablet Take 5 mg by mouth at bedtime as needed.     Allergies:   Other, Shellfish allergy, Codeine, Hydrocodone, and Neosporin [neomycin-bacitracin zn-polymyx]   Social History   Socioeconomic History   Marital status: Divorced    Spouse name: Not on file   Number of children: 2   Years of education: Not on file   Highest education level: Not on file  Occupational History   Occupation: Retired  Tobacco Use   Smoking status: Former    Packs/day: 0.50    Years: 25.00    Total pack years: 12.50    Types: Cigarettes    Quit date: 12/19/1978    Years since quitting: 44.1   Smokeless tobacco: Never  Vaping Use   Vaping Use: Never used  Substance and Sexual Activity   Alcohol use: Yes    Comment: "wine usually, no taste in the last in past few weeks- (02/07/2020)   Drug use: No   Sexual activity: Not Currently    Comment: menarche at age14, menopause in 62, HRT x 74, age at first live birth 60's, G71 P3  Other Topics Concern   Not on file  Social History Narrative   Not on file   Social Determinants of Health   Financial Resource Strain: Not on file  Food Insecurity: Not on file  Transportation Needs: Not  on file  Physical Activity: Not on file  Stress: Not on file  Social Connections: Not on file     Family History: The patient's family history includes Heart disease in her mother; Lung cancer in her sister; Prostate cancer in her father.  ROS:   Please see the history of present illness.     All other systems reviewed and are negative.   Risk Assessment/Calculations:           Physical Exam:    VS:  BP 114/64 (BP Location: Right Arm, Patient Position: Sitting, Cuff Size: Normal)   Pulse (!) 110   Ht 5\' 5"  (1.651 m)   Wt 127 lb 3.2 oz (57.7 kg)   BMI 21.17 kg/m     Wt Readings from Last 3 Encounters:  01/27/23 127 lb 3.2 oz (57.7 kg)  12/22/22 137 lb 3.2 oz (62.2 kg)  03/22/22 133 lb 4.8 oz (60.5 kg)     GEN:  Well nourished, well developed in no acute distress HEENT: Normal NECK: No JVD; No carotid bruits LYMPHATICS: No lymphadenopathy CARDIAC: Tachycardic, no murmurs, rubs, gallops RESPIRATORY:  decreased BS throughout left lower lung fields. Otherwise clear.  ABDOMEN: Soft, non-tender, non-distended MUSCULOSKELETAL: no edema; No deformity  SKIN: Warm and dry NEUROLOGIC:  Alert and oriented x 3 PSYCHIATRIC:  Normal affect    EKGs/Labs/Other Studies Reviewed:    The following studies were reviewed today: Nuclear stress test 03/23/2018 Nuclear stress EF: 56%. Clinically and electrically negative for ischemia Normal perfusiion No ischemia or scar This is a low risk study.       Echocardiogram 05/29/2020 IMPRESSIONS     1. Left ventricular ejection fraction, by estimation, is 60 to 65%. The  left ventricle has normal function. The left ventricle has no regional  wall motion abnormalities. Left ventricular diastolic parameters are  consistent with Grade II diastolic  dysfunction (pseudonormalization).   2. Right ventricular systolic function is normal. The right ventricular  size  is mildly enlarged. There is moderately elevated pulmonary artery  systolic  pressure. The estimated right ventricular systolic pressure is  35.0 mmHg.   3. Right atrial size was moderately dilated.   4. The mitral valve is normal in structure. Mild mitral valve  regurgitation. No evidence of mitral stenosis.   5. Tricuspid valve regurgitation is moderate.   6. The aortic valve is normal in structure. Aortic valve regurgitation is  mild. No aortic stenosis is present.   7. The inferior vena cava is normal in size with greater than 50%  respiratory variability, suggesting right atrial pressure of 3 mmHg.  Echo 09/09/21: IMPRESSIONS     1. Left ventricular ejection fraction, by estimation, is 65 to 70%. The  left ventricle has normal function. The left ventricle has no regional  wall motion abnormalities. Left ventricular diastolic parameters are  indeterminate. There is the  interventricular septum is flattened in systole, consistent with right  ventricular pressure overload.   2. Right ventricular systolic function is mildly reduced. The right  ventricular size is mildly enlarged. There is moderately elevated  pulmonary artery systolic pressure. The estimated right ventricular  systolic pressure is 09.3 mmHg.   3. Right atrial size was mildly dilated.   4. The mitral valve is normal in structure. Mild to moderate mitral valve  regurgitation. No evidence of mitral stenosis.   5. The aortic valve is grossly normal. Aortic valve regurgitation is  moderate. Mild aortic valve sclerosis is present, with no evidence of  aortic valve stenosis.   6. The inferior vena cava is normal in size with greater than 50%  respiratory variability, suggesting right atrial pressure of 3 mmHg.   Comparison(s): 05/29/20 60-65%, valve disease similar to slightly  progressed on current study without severe disease.   IMPRESSION: 1. Stable CT of the chest. Unchanged post treatment appearance of the left chest with dense, essentially complete consolidation of the left lower lobe  with volume loss of the left hemithorax. 2. Unchanged appearance of loculated left pleural effusion 3. Stable small pulmonary nodules, likely benign and incidental. 4. 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 5. Aortic Atherosclerosis (ICD10-I70.0).     Electronically Signed   By: Kerby Moors M.D.   On: 08/02/2022 16:43   CHEST - 2 VIEW   COMPARISON:  07/26/2021 and chest CT 08/02/2022   FINDINGS: Chronic volume loss in the left lower lobe and evidence for a persistent left pleural effusion. Focal densities in the right hilar region are unchanged. Heart size is grossly stable. Prominent lung markings are similar to the prior chest CT topogram. No acute bone abnormality.   IMPRESSION: 1. Chronic volume loss in the left lower lobe with persistent left pleural effusion. 2. Prominent lung markings are similar to the prior chest CT topogram. No acute findings.    CT ANGIOGRAPHY CHEST WITH CONTRAST   TECHNIQUE: Multidetector CT imaging of the chest was performed using the standard protocol during bolus administration of intravenous contrast. Multiplanar CT image reconstructions and MIPs were obtained to evaluate the vascular anatomy.   RADIATION DOSE REDUCTION: This exam was performed according to the departmental dose-optimization program which includes automated exposure control, adjustment of the mA and/or kV according to patient size and/or use of iterative reconstruction technique.   CONTRAST:  106mL OMNIPAQUE IOHEXOL 350 MG/ML SOLN   COMPARISON:  CT  08/02/2022.   FINDINGS: Cardiovascular: Satisfactory opacification of the pulmonary arteries to the segmental level. Chronic thrombus in the left lower lobe artery with reduced, now non opacification of the distal segmental vessels. No evidence of  acute pulmonary embolism. Mild atherosclerosis of the thoracic aorta. Dilated right atrium and right ventricle, unchanged from prior exam. Reflux of contrast into the IVC and hepatic veins.   Mediastinum/Nodes: Mildly prominent precarinal lymph node, similar to prior. No new lymphadenopathy. Atrophic thyroid. Tiny hiatal hernia.   Lungs/Pleura: Unchanged left upper lobe perihilar masslike architectural distortion in fibrosis. Chronic left lower lobe consolidation. Chronic loculated left pleural effusion with adjacent pleural thickening, not significantly changed from prior. There is a new small right pleural effusion.   There is interlobular septal thickening and ground-glass opacities bilaterally.   Unchanged nodular pleural thickening in the peripheral left apex (series 5, image 45).   Unchanged soft tissue thickening along the minor fissure of the right lung, measuring up to 8 mm in thickness (sagittal image 40).   Subpleural nodule in the anterior right middle lobe measures 5 mm, previously 5 mm (series 6, image 81).   Subpleural nodule in the posterior right lower lobe is obscured by the new right pleural effusion.   Upper Abdomen: Cholelithiasis. Unchanged 1.3 x 0.9 cm left adrenal nodule (series 5, image 286).   Musculoskeletal: No acute osseous abnormality. No suspicious osseous lesion.   Review of the MIP images confirms the above findings.   IMPRESSION: Progressed thrombus in situ associated with post-radiation change in the left lower lobe pulmonary artery, with unchanged chronic left lower lobe consolidation/volume loss and adjacent moderate-sized chronic loculated left pleural effusion.   No evidence of acute pulmonary embolism. Mild to moderate pulmonary edema with small right pleural effusion, new from prior exam.   Stable nodular thickening in the left apex and right minor fissure. Stable anterior right middle lobe pulmonary nodule. Previously  noted posterior right lower lobe subpleural nodule is obscured by the new right pleural effusion.     Electronically Signed   By: Maurine Simmering M.D.   On: 12/29/2022 15:05      Result History   EKG: is done today. Sinus rhythm rate 110. Low voltage. Otherwise normal. I have personally reviewed and interpreted this study.     Recent Labs: 12/22/2022: BNP 538.7; BUN 10; Creatinine, Ser 0.70; Hemoglobin 13.1; Platelets 238; Potassium 4.6; Sodium 129  Recent Lipid Panel    Component Value Date/Time   CHOL 158 12/26/2017 0025   TRIG 167 (H) 12/26/2017 0025   HDL 51 12/26/2017 0025   CHOLHDL 3.1 12/26/2017 0025   VLDL 33 12/26/2017 0025   LDLCALC 74 12/26/2017 0025   Dated 04/15/21: cholesterol 174, triglycerides 71, HDL 71, LDL 89.  Dated 05/02/22: cholesterol 171, triglycerides 76, HDL 70, LDL 86, LFTs normal.   ASSESSMENT & PLAN    SOB-there does appear to be a component of diastolic CHF based on lab/Xray evaluation Continue Heart healthy low-sodium diet Echocardiogram as noted above with normal LV function. Will check BMET today Continue lasix 20 mg daily   2. Essential hypertension- Well-controlled  Continue metoprolol   3. Sinus tachycardia- chronic  Continue metoprolol to 25 mg a.m. and continue 12.5 mg in the p.m.   4. Coronary calcium-no chest pain  Nuclear stress test showed normal perfusion. Continue aspirin, metoprolol, nitroglycerin, pravastatin   5. Lung cancer-status postradiation therapy.  Follow-up CT showed stable findings.  No PE.  Noted to have chronic left pleural  effusion and LLL consolidation   6. Breast cancer-completed chemotherapy.  There is a recurrent breast CA on the right. Follows with oncology       Disposition: Follow-up 4 months       Medication Adjustments/Labs and Tests Ordered: Current medicines are reviewed at length with the patient today.  Concerns regarding medicines are outlined above.  No orders of the defined types were  placed in this encounter.  No orders of the defined types were placed in this encounter.   There are no Patient Instructions on file for this visit.   Signed, Barrie Wale Martin[place], MD  01/27/2023 10:40 AM

## 2023-01-27 ENCOUNTER — Encounter: Payer: Self-pay | Admitting: Cardiology

## 2023-01-27 ENCOUNTER — Ambulatory Visit: Payer: Medicare Other | Attending: Cardiology | Admitting: Cardiology

## 2023-01-27 VITALS — BP 114/64 | HR 110 | Ht 65.0 in | Wt 127.2 lb

## 2023-01-27 DIAGNOSIS — R0609 Other forms of dyspnea: Secondary | ICD-10-CM

## 2023-01-27 DIAGNOSIS — I10 Essential (primary) hypertension: Secondary | ICD-10-CM | POA: Diagnosis not present

## 2023-01-27 DIAGNOSIS — I5032 Chronic diastolic (congestive) heart failure: Secondary | ICD-10-CM

## 2023-01-27 DIAGNOSIS — I2584 Coronary atherosclerosis due to calcified coronary lesion: Secondary | ICD-10-CM | POA: Diagnosis not present

## 2023-01-27 DIAGNOSIS — I2510 Atherosclerotic heart disease of native coronary artery without angina pectoris: Secondary | ICD-10-CM

## 2023-01-27 NOTE — Patient Instructions (Signed)
Medication Instructions:  Continue current medications  *If you need a refill on your cardiac medications before your next appointment, please call your pharmacy*   Lab Work: BMP Today  If you have labs (blood work) drawn today and your tests are completely normal, you will receive your results only by: [facility] (if you have MyChart) OR A paper copy in the mail If you have any lab test that is abnormal or we need to change your treatment, we will call you to review the results.   Testing/Procedures: None Ordered   Follow-Up: At [facility], you and your health needs are our priority.  As part of our continuing mission to provide you with exceptional heart care, we have created designated Provider Care Teams.  These Care Teams include your primary Cardiologist (physician) and Advanced Practice Providers (APPs -  Physician Assistants and Nurse Practitioners) who all work together to provide you with the care you need, when you need it.  We recommend signing up for the patient portal called "MyChart".  Sign up information is provided on this After Visit Summary.  MyChart is used to connect with patients for Virtual Visits (Telemedicine).  Patients are able to view lab/test results, encounter notes, upcoming appointments, etc.  Non-urgent messages can be sent to your provider as well.   To learn more about what you can do with MyChart, go to [email or web address].    Your next appointment:   4 month(s)  Provider:   Peter Martinique, MD     Other Instructions

## 2023-01-28 LAB — BASIC METABOLIC PANEL
BUN/Creatinine Ratio: 14 (ref 12–28)
BUN: 10 mg/dL (ref 10–36)
CO2: 30 mmol/L — ABNORMAL HIGH (ref 20–29)
Calcium: 9.5 mg/dL (ref 8.7–10.3)
Chloride: 90 mmol/L — ABNORMAL LOW (ref 96–106)
Creatinine, Ser: 0.72 mg/dL (ref 0.57–1.00)
Glucose: 109 mg/dL — ABNORMAL HIGH (ref 70–99)
Potassium: 4.3 mmol/L (ref 3.5–5.2)
Sodium: 133 mmol/L — ABNORMAL LOW (ref 134–144)
eGFR: 77 mL/min/{1.73_m2} (ref >59)

## 2023-01-30 DIAGNOSIS — H353122 Nonexudative age-related macular degeneration, left eye, intermediate dry stage: Secondary | ICD-10-CM | POA: Diagnosis not present

## 2023-01-30 DIAGNOSIS — H43813 Vitreous degeneration, bilateral: Secondary | ICD-10-CM | POA: Diagnosis not present

## 2023-01-30 DIAGNOSIS — H353211 Exudative age-related macular degeneration, right eye, with active choroidal neovascularization: Secondary | ICD-10-CM | POA: Diagnosis not present

## 2023-02-03 ENCOUNTER — Ambulatory Visit (HOSPITAL_COMMUNITY)
Admission: RE | Admit: 2023-02-03 | Discharge: 2023-02-03 | Disposition: A | Discharge status: Home / Self Care | Payer: Medicare Other | Source: Ambulatory Visit | Attending: Urology | Admitting: Urology

## 2023-02-03 DIAGNOSIS — C3432 Malignant neoplasm of lower lobe, left bronchus or lung: Secondary | ICD-10-CM | POA: Diagnosis not present

## 2023-02-03 DIAGNOSIS — C3491 Malignant neoplasm of unspecified part of right bronchus or lung: Secondary | ICD-10-CM | POA: Insufficient documentation

## 2023-02-03 DIAGNOSIS — C3490 Malignant neoplasm of unspecified part of unspecified bronchus or lung: Secondary | ICD-10-CM | POA: Diagnosis not present

## 2023-02-03 DIAGNOSIS — J90 Pleural effusion, not elsewhere classified: Secondary | ICD-10-CM | POA: Diagnosis not present

## 2023-02-03 MED ORDER — IOHEXOL 300 MG/ML  SOLN
75.0000 mL | Freq: Once | INTRAMUSCULAR | Status: AC | PRN
  Administered 2023-02-03: 75 mL via INTRAVENOUS

## 2023-02-08 ENCOUNTER — Encounter: Payer: Self-pay | Admitting: Urology

## 2023-02-08 ENCOUNTER — Ambulatory Visit
Admission: RE | Admit: 2023-02-08 | Discharge: 2023-02-08 | Disposition: A | Discharge status: Home / Self Care | Payer: Medicare Other | Source: Ambulatory Visit | Attending: Urology | Admitting: Urology

## 2023-02-08 DIAGNOSIS — C342 Malignant neoplasm of middle lobe, bronchus or lung: Secondary | ICD-10-CM | POA: Diagnosis not present

## 2023-02-08 DIAGNOSIS — C3432 Malignant neoplasm of lower lobe, left bronchus or lung: Secondary | ICD-10-CM

## 2023-02-08 DIAGNOSIS — C3491 Malignant neoplasm of unspecified part of right bronchus or lung: Secondary | ICD-10-CM

## 2023-02-08 NOTE — Progress Notes (Signed)
[name]  Radiation Oncology         [phone] ________________________________  Name: LOURA PITT MRN: [id number]  Date: 02/08/2023  DOB: 09-30-1926  Post Treatment Note  CC: Tisovec, Fransico Him, MD  Tisovec, Fransico Him, MD  Diagnosis:   89 y.o. woman with history of synchronous NSCLC, adenocarcinoma with a 6 cm LLL mass extending into the left hilum (stage IIB, T3N0M0) and a 2.4 cm RML mass (stage IB T2N0).      Interval Since Last Radiation:  5 years 11/28/17 - 01/18/18:  66 Gy directed to the LLL and RML lung lesions in 33 fractions of 2 Gy each.  Narrative:  I spoke with the patient to conduct her routine scheduled 6 month follow up visit to review her recent follow-up CT chest results via telephone to spare the patient unnecessary potential exposure in the healthcare setting during the current COVID-19 pandemic.  The patient was notified in advance and gave permission to proceed with this visit format.  Her most recent CT Chest rom 02/03/23 shows a stable appearance of the presumed post radiation changes in the inferomedial left hemithorax with dense, essentially complete consolidation and fibrosis of the left lower lobe, volume loss of the left hemithorax, and a stable, moderate loculated left pleural effusion. Additional bandlike scarring and fibrosis of the anterior right upper lobe is stable and there are no new findings to suggest lung cancer progression or recurrence.  She did not receive any systemic treatment for the lung cancer, as the feeling was that concurrent chemoradiation would be too toxic for the patient due to her age.  However, molecular studies were ordered to assess for a potential role of immunotherapy if she demonstrated PD1 expression greater than 50% but her molecular studies returned showing only a very low expression of PD1 at 5%.  There were no other identifiable mutations noted.  She completed a 6.5 week course of radiotherapy which she tolerated relatively well  and has remained in observation with Dr. Lindi Adie.   Unfortunately, she was found to have a recurrence of her right breast cancer with a 4.2 cm mass in the right breast detected on diagnostic mammography on December 23, 2019 with confirmation of a grade 2 invasive lobular carcinoma with LCIS, ER/PR positive, and HER-2 neu positive via right breast biopsy performed on 01/03/2020.  Fortunately, there was no evidence of distant metastatic disease on CT C/A/P 01/17/2020.  She had a lumpectomy with Dr. Ninfa Linden on 02/06/20 and has done very well with no evidence of recurrence on follow-up mammograms.  In summary, she has a remote history of breast cancer diagnosed and treated in 2014, an was initially seen in consult on 11/15/18 at the request of Dr. Lindi Adie for newly diagnosed non-small cell lung cancer and incidental finding of meningioma. She presented to her PCP in October 2018 with persistent dry cough that led to an isolated episode of hemoptysis. She denied further episodes of hemoptysis since that time. She presented to her PCP for evaluation and was being worked up for possible pneumonia with a chest x-ray and a CT scan of the chest. CT done on 10/11/2017 showed a 6.0 cm LLL lung mass with additional lung nodules, including a 2.4 cm pulmonary nodule in the RML as well as 0.3 cm satellite nodules. There was also a hypodense 0.4 cm RLL lesion. A PET CT scan on 112/18 demonstrated a hypermetabolic LLL mass extending into the left hilum with SUV of 26.8 (stage IIb, T3N0M0) and a  mildly hypermetabolic RML mass with SUV max of 1.9 (stage IB T2N0).  There were no hypermetabolic hilar or mediastinal nodes.  She was evaluated with Dr. Roxan Hockey and underwent bilateral navigational bronchoscopy with biopsies of the lung masses on 11/01/17 which revealed bilateral non-small cell lung cancer, poorly differentiated adenocarcinoma.    An MRI brain was performed on 10/20/2017 for disease staging and incidentally showed a right  inferior parietal convexity meningioma measuring 2.9 x 2.7 x 1.4 cm. There was no evidence of metastatic disease. Her imaging was reviewed at the multidisciplinary brain conference and consensus recommendation was to repeat a scan in 3 months to assess for progression/enlargement. Repeat brain MRI was performed on 01/24/2018 demonstrating a persistent right temporoparietal meningioma unchanged in size with only mild vasogenic edema which appeared improved from her previous study.  There was no evidence of metastatic disease to the brain.  This study was again reviewed at the multidisciplinary brain conference on 01/29/2018 and consensus recommendation was to only repeat brain imaging if the patient develops symptoms.                       On review of systems, the patient states that she is doing fair overall. Currently, she denies orthopnea, chest pain, dysphagia, fever, chills, cough or hemoptysis. She has persistent baseline shortness of breath with exertion which has remained unchanged since 10/2017, increased with activity but stable over the past year.  She denies dyspnea at rest and reports that the shortness of breath with exertion resolves quickly with rest. She does feel like her energy level has significantly decreased over the past 3-4 months which is frustrating to her since she is normally such an active, social person. She has continued to socialize at her extended living facility, [facility], and is still able to get herself down to the dining room for meals but really has zero motivation to much else during the day.  She also reports significant decreased appetite and has lost 10 lbs over the past 6 months. She is eating regularly, in an attempt to maintain weight, and still enjoys the foods at [facility] but just has little desire to eat.  She has continued to tolerate the dose adjusted metoprolol for tachycardia and has continued in follow up with Dr. Peter Martinique. He recently started her on Lasix  for some RLE swelling and fatigue. She denies headaches, changes in visual or auditory acuity, difficulty with speech or word finding, imbalance, tinnitus, tremor or seizure activity.    ALLERGIES:  is allergic to other, shellfish allergy, codeine, hydrocodone, and neosporin [neomycin-bacitracin zn-polymyx].  Meds: Current Outpatient Medications  Medication Sig Dispense Refill   acetaminophen (TYLENOL) 500 MG tablet Take 1,000 mg by mouth every 6 (six) hours as needed for moderate pain or headache.     Artificial Tear Solution (BION TEARS OP) Place 1-2 drops into both eyes as needed (dry eyes).      aspirin 81 MG tablet Take 81 mg by mouth daily.     B Complex-C-Folic Acid (SUPER B COMPLEX/FA/VIT C) TABS Take by mouth.     cetirizine (ZYRTEC) 10 MG tablet Take 10 mg by mouth daily.     Cholecalciferol (VITAMIN D-3) 1000 UNITS CAPS Take 1,000 Units daily by mouth.      doxylamine, Sleep, (UNISOM) 25 MG tablet Take 25 mg at bedtime by mouth.     famotidine (PEPCID) 20 MG tablet Take 20 mg by mouth daily.     furosemide (LASIX)  20 MG tablet Take 2 tablests ( 40 mg ) every morning 90 tablet 3   Glucosamine-Chondroit-MSM-C-Mn CAPS Take 1 capsule by mouth daily.     metoprolol tartrate (LOPRESSOR) 25 MG tablet TAKE ONE TABLET TWICE DAILY (Patient taking differently: Take 1(25mg ) tablet morning 1/2(12.5mg ) tablet at bedtime.) 180 tablet 3   nitroGLYCERIN (NITROSTAT) 0.4 MG SL tablet Place 0.4 mg under the tongue every 5 (five) minutes as needed for chest pain.     pravastatin (PRAVACHOL) 40 MG tablet Take 40 mg daily by mouth.      Probiotic Product (ALIGN) 4 MG CAPS Take 4 mg daily by mouth.     zolpidem (AMBIEN) 5 MG tablet Take 5 mg by mouth at bedtime as needed.     No current facility-administered medications for this encounter.    Physical Findings:  vitals were not taken for this visit.  Pain Assessment Pain Score: 0-No pain/10 Unable to assess due to telephone follow up visit  format.  Lab Findings: Lab Results  Component Value Date   WBC 6.6 12/22/2022   HGB 13.1 12/22/2022   HCT 39.6 12/22/2022   MCV 87 12/22/2022   PLT 238 12/22/2022     Radiographic Findings: CT Chest W Contrast  Result Date: 02/03/2023 CLINICAL DATA:  Non-small cell lung cancer. Bilateral lung cancer. Prior insight to thrombus in the LEFT pulmonary artery potentially related to radiation change. * Tracking Code: BO * EXAM: CT CHEST WITH CONTRAST TECHNIQUE: Multidetector CT imaging of the chest was performed during intravenous contrast administration. RADIATION DOSE REDUCTION: This exam was performed according to the departmental dose-optimization program which includes automated exposure control, adjustment of the mA and/or kV according to patient size and/or use of iterative reconstruction technique. CONTRAST:  21mL OMNIPAQUE IOHEXOL 300 MG/ML  SOLN COMPARISON:  CT 12/29/2022 FINDINGS: Cardiovascular: Occlusive intraluminal filling defect within the proximal aspect of the LEFT lower lobe pulmonary artery is similar to comparison exam (12/29/2022) there is some recanalization through the occlusive thrombus (image 74/2 no evidence of acute pulmonary emboli the LEFT or RIGHT lung. Mediastinum/Nodes: Mediastinal lymph nodes are similar. For example RIGHT lower paratracheal node measuring 11 mm unchanged from prior. Lungs/Pleura: Dense consolidation in the LEFT lower lobe again noted. Some improved vascularity compared to prior as above. Partially loculated LEFT pleural effusion is similar. The light lung there is band of consolidation along the horizontal fissure not changed from prior. RIGHT middle lobe peripheral nodule measuring 5 mm (image 83/2) is unchanged. Upper Abdomen: Was several small nodules position between the LEFT adrenal gland and the crus of the diaphragm are again noted largest measuring 6 mm (image 149/2) no adrenal metastasis. Liver abnormality. Musculoskeletal: No aggressive osseous  lesion IMPRESSION: 1. 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 Interval improvement with recanalization centrally through the thrombus. 2. No evidence acute pulmonary embolism. 3. Dense consolidation in the LEFT lower lobe similar prior. No change in loculated LEFT pleural effusion. No evidence of lung cancer progression. 4. No new findings in the RIGHT lung. 5. cluster of small lymph nodes in the LEFT suprarenal space unchanged from prior. Electronically Signed   By: Suzy Bouchard M.D.   On: 02/03/2023 14:52     Impression/Plan: 1. 89 y.o. female with synchronous NSCLC, adenocarcinoma with a 6 cm LLL mass extending into the left hilum (stage IIB, T3N0M0) and a 2.4 cm RML mass (stage IB T2N0).   She remains stable both clinically and radiographically with regards to the lung  cancer.  Her most recent CT Chest from 02/03/23 shows a stable appearance of presumed post radiation changes in the inferomedial left hemithorax with dense, essentially complete consolidation and fibrosis of the left lower lobe, volume loss of the left hemithorax, and a stable, moderate loculated left pleural effusion. Additional bandlike scarring and fibrosis of the anterior right upper lobe is stable and there are no new nodules or findings to suggest disease recurrence or progression. Since we are now 5 years out from treatment and without evidence of disease recurrence, we will now move to annual low dose chest CT scans for continued surveillance and I will call her following each scan to review results and recommendations.  She knows to call immediately for any progressive SOB or concerning symptoms in the interim. She had a therapeutic thoracentesis in June 2021 but is reluctant to pursue further thoracentesis for pleural effusion unless felt absolutely necessary since her breathing remains stable at present.  We again discussed that should there be any progression in fluid accumulation or  progressive shortness of breath with less exertion, we would need to consider moving forward with pulmonary evaluation and/or thoracentesis for therapeutic and diagnostic purposes.  She is in agreement with the stated plan.  2.         Meningioma. This was an incidental finding and patient is currently without neurologic or systemic complaints.  It does not appear to be causing a mass effect on any structures in the brain and remains stable on MRI brain from 01/24/2018. The consensus at brain conference from 01/29/2018 is to only repeat brain imaging if she becomes symptomatic.  Otherwise we will just follow this expectantly and only repeat further brain imaging should she develop concerning symptoms. She is in agreement with and is comfortable with this plan.  3. Tachycardia.  This is being managed by her cardiologist, Dr. Peter Martinique.  4. History of recurrent right breast cancer. She was found to have a recurrence of her right breast cancer with a 4.2 cm mass in the right breast detected on diagnostic mammography on December 23, 2019 with confirmation of a grade 2 invasive lobular carcinoma with LCIS, ER/PR positive, and HER-2 neu positive via right breast biopsy performed on 01/03/2020.  Fortunately, there was no evidence of distant metastatic disease on CT C/A/P 01/17/2020.  She had a lumpectomy with Dr. Ninfa Linden on 02/06/20 but adamantly not interested in adjuvant radiotherapy.  Follow up imaging has not shown any definite evidence of disease recurrence or progression. She will continue in routine follow up for continued systemic disease management with Dr. Lindi Adie and has her next appointment scheduled for 03/23/23, following her annual mammography in 02/2023.      Nicholos Johns, PA-C

## 2023-02-08 NOTE — Progress Notes (Signed)
Telephone nursing appointment for patient to review most recent CT results from 02/03/23. I was unable to reach patient and spoke w/ patient 's  daughter Ms. Zada Finders, verified her identity and began nursing interview. She reports patient is doing well. No issues at this time but was slightly unsure of the accuracy of info.   Meaningful use complete.   Patient aware of their 10:00am-02/08/2023 telephone appointment w/ Ashlyn Bruning PA-C. I left my extension [phone] in case patient needs anything. Patient verbalized understanding. This concludes the nursing interview.   Patient contact [phone] or daughter Ms. [place]     Leandra Kern, LPN

## 2023-03-07 ENCOUNTER — Other Ambulatory Visit: Payer: Medicare Other

## 2023-03-07 ENCOUNTER — Encounter: Payer: Self-pay | Admitting: Hematology and Oncology

## 2023-03-15 NOTE — Progress Notes (Signed)
Patient Care Team: Tisovec, Fransico Him, MD as PCP - General (Internal Medicine) Martinique, Peter M, MD as PCP - Cardiology (Cardiology) Nicholas Lose, MD as Consulting Physician (Hematology and Oncology)  DIAGNOSIS: No diagnosis found.  SUMMARY OF ONCOLOGIC HISTORY: Oncology History  Malignant neoplasm of upper-inner quadrant of right breast in female, estrogen receptor positive ([place])  05/08/2013 Initial Biopsy   Right: grade 1-2 invasive mammary carcinoma ER positive PR positive HER-2/neu negative with Ki-67 30% (MRI 2.8 cm)second smaller mass together 3.8cm   05/11/2013 - 11/04/2013 Anti-estrogen oral therapy   Letrozole 2.5 mg Neoadjuvant anti-estrogen therapy   11/06/2013 Surgery   Bilateral Lumpectomies: Left: sclerosing lesion with ALH fibrocystic changes with microcalcifications. Right: Grade 1 ILC 2.7 cm with LCIS    Radiation Therapy   Patient declined   11/19/2013 - 06/09/2015 Anti-estrogen oral therapy   Letrozole 2.5 mg (stopped for arthralgias and myalgias and fatigue accompanied with hair loss)   01/01/2020 Relapse/Recurrence   Screening mammogram detected a right breast mass. Diagnostic mammogram showed a 4.2cm mass at the 11 o'clock position in the right breast. Biopsy showed invasive mammary carcinoma, grade 2, HER-2 + (3+), ER+ 95%, PR+ 90%, Ki67 20%.    02/10/2020 Surgery   Right lumpectomy Ninfa Linden): mammary carcinoma, grade 2, 3.3cm, clear margins.   02/14/2020 -  Anti-estrogen oral therapy   Anastrozole, half tablet daily   03/05/2020 - 09/14/2020 Chemotherapy   Patient is on Treatment Plan : BREAST Trastuzumab Subcutaneous q21d     Non-small cell cancer of right lung ([place])  10/20/2017 PET scan   6 cm central left lower lobe pulmonary mass is markedly hypermetabolic with SUV max = 0000000 and extends into the left hilum.   The sub solid 2.4 cm pulmonary nodule in the right middle lobe also shows FDG accumulation with SUV max = 1.9. No evidence for hypermetabolic  mediastinal or right hilar lymphadenopathy. L2 uptake degenerative.  LLL:T3N0M0 stage IIb clinical stage; RML: T2N0 Stage 1B   10/20/2017 Imaging   MRI brain: No metastatic disease, right inferior parietal convexity meningioma 2.9 x 2.7 x 1.4 cm.  This indents the brain and associated with mild brain edema   11/01/2017 Initial Diagnosis   Transbronchial needle aspiration right middle lobe and left lower lobe brushings: Both are positive for malignant cells consistent with non-small cell lung cancer    11/29/2017 - 01/18/2018 Radiation Therapy   Radiation   12/01/2017 Pathology Results   Foundation 1:TPS score: 5%; MS-Stable, TMG High, AKT2 Amp, RB1, TP 53 (no mutations noted in EGFR, K-ras, Al, BRAF, RET, ERBB2, Ros 1)     CHIEF COMPLIANT:  Follow-up of recurrent right breast cancer on anastrozole   INTERVAL HISTORY: [name] French is a 89 y.o. with above-mentioned history of right breast cancer treated with lumpectomy, Herceptin maintenance, and who is currently on antiestrogen therapy with anastrozole. She presents to the clinic for follow-up.    ALLERGIES:  is allergic to other, shellfish allergy, codeine, hydrocodone, and neosporin [neomycin-bacitracin zn-polymyx].  MEDICATIONS:  Current Outpatient Medications  Medication Sig Dispense Refill   acetaminophen (TYLENOL) 500 MG tablet Take 1,000 mg by mouth every 6 (six) hours as needed for moderate pain or headache.     Artificial Tear Solution (BION TEARS OP) Place 1-2 drops into both eyes as needed (dry eyes).      aspirin 81 MG tablet Take 81 mg by mouth daily.     B Complex-C-Folic Acid (SUPER B COMPLEX/FA/VIT C) TABS Take by mouth.  cetirizine (ZYRTEC) 10 MG tablet Take 10 mg by mouth daily.     Cholecalciferol (VITAMIN D-3) 1000 UNITS CAPS Take 1,000 Units daily by mouth.      doxylamine, Sleep, (UNISOM) 25 MG tablet Take 25 mg at bedtime by mouth.     famotidine (PEPCID) 20 MG tablet Take 20 mg by mouth daily.      furosemide (LASIX) 20 MG tablet Take 2 tablests ( 40 mg ) every morning 90 tablet 3   Glucosamine-Chondroit-MSM-C-Mn CAPS Take 1 capsule by mouth daily.     metoprolol tartrate (LOPRESSOR) 25 MG tablet TAKE ONE TABLET TWICE DAILY (Patient taking differently: Take 1(25mg ) tablet morning 1/2(12.5mg ) tablet at bedtime.) 180 tablet 3   nitroGLYCERIN (NITROSTAT) 0.4 MG SL tablet Place 0.4 mg under the tongue every 5 (five) minutes as needed for chest pain.     pravastatin (PRAVACHOL) 40 MG tablet Take 40 mg daily by mouth.      Probiotic Product (ALIGN) 4 MG CAPS Take 4 mg daily by mouth.     zolpidem (AMBIEN) 5 MG tablet Take 5 mg by mouth at bedtime as needed.     No current facility-administered medications for this visit.    PHYSICAL EXAMINATION: ECOG PERFORMANCE STATUS: {CHL ONC ECOG PS:[id number]}  There were no vitals filed for this visit. There were no vitals filed for this visit.  BREAST:*** No palpable masses or nodules in either right or left breasts. No palpable axillary supraclavicular or infraclavicular adenopathy no breast tenderness or nipple discharge. (exam performed in the presence of a chaperone)  LABORATORY DATA:  I have reviewed the data as listed    Latest Ref Rng & Units 01/27/2023   10:44 AM 12/22/2022    3:58 PM 01/28/2022    2:06 PM  CMP  Glucose 70 - 99 mg/dL 109  98    BUN 10 - 36 mg/dL 10  10    Creatinine 0.57 - 1.00 mg/dL 0.72  0.70  0.80   Sodium 134 - 144 mmol/L 133  129    Potassium 3.5 - 5.2 mmol/L 4.3  4.6    Chloride 96 - 106 mmol/L 90  89    CO2 20 - 29 mmol/L 30  24    Calcium 8.7 - 10.3 mg/dL 9.5  9.3      Lab Results  Component Value Date   WBC 6.6 12/22/2022   HGB 13.1 12/22/2022   HCT 39.6 12/22/2022   MCV 87 12/22/2022   PLT 238 12/22/2022   NEUTROABS 4.7 12/22/2022    ASSESSMENT & PLAN:  No problem-specific Assessment & Plan notes found for this encounter.    No orders of the defined types were placed in this encounter.  The  patient has a good understanding of the overall plan. she agrees with it. she will call with any problems that may develop before the next visit here. Total time spent: 30 mins including face to face time and time spent for planning, charting and co-ordination of care   Suzzette Righter, [place] 03/15/23    I Gardiner Coins am acting as a Education administrator for [employer]  ***

## 2023-03-20 DIAGNOSIS — H353211 Exudative age-related macular degeneration, right eye, with active choroidal neovascularization: Secondary | ICD-10-CM | POA: Diagnosis not present

## 2023-03-23 ENCOUNTER — Other Ambulatory Visit: Payer: Self-pay

## 2023-03-23 ENCOUNTER — Inpatient Hospital Stay: Payer: Medicare Other | Attending: Hematology and Oncology | Admitting: Hematology and Oncology

## 2023-03-23 VITALS — BP 103/80 | HR 101 | Temp 97.3°F | Resp 18 | Wt 126.5 lb

## 2023-03-23 DIAGNOSIS — Z923 Personal history of irradiation: Secondary | ICD-10-CM | POA: Diagnosis not present

## 2023-03-23 DIAGNOSIS — Z79811 Long term (current) use of aromatase inhibitors: Secondary | ICD-10-CM | POA: Diagnosis not present

## 2023-03-23 DIAGNOSIS — Z170 Estrogen receptor positive status [ER+]: Secondary | ICD-10-CM | POA: Diagnosis not present

## 2023-03-23 DIAGNOSIS — Z7982 Long term (current) use of aspirin: Secondary | ICD-10-CM | POA: Diagnosis not present

## 2023-03-23 DIAGNOSIS — Z79899 Other long term (current) drug therapy: Secondary | ICD-10-CM | POA: Diagnosis not present

## 2023-03-23 DIAGNOSIS — Z9221 Personal history of antineoplastic chemotherapy: Secondary | ICD-10-CM | POA: Insufficient documentation

## 2023-03-23 DIAGNOSIS — C50211 Malignant neoplasm of upper-inner quadrant of right female breast: Secondary | ICD-10-CM | POA: Diagnosis not present

## 2023-03-23 NOTE — Assessment & Plan Note (Signed)
12/23/2019: Suspicious 4.2 cm mass in the right breast, no abnormal right axillary lymph nodes 01/03/2020: Right breast biopsy 11 o'clock position: Grade 2 invasive lobular carcinoma with LCIS ER 95%, PR 90%, Ki-67 20%, HER-2 3+ positive   CT CAP 01/17/20: Masslike consolidation following radiation measuring 6.8 x 3.6 cm as compared to 7.2 by 4.2 cm. Pleural thickening approximately 6 mm . No metastatic disease   02/10/2020:Right lumpectomy Ninfa Linden): mammary carcinoma, grade 2, 3.3cm, clear margins. Adjuvant anastrozole with Herceptin subcutaneously started 03/05/2020 (discontinued by her preference on 09/14/20)   ------------------------------------------------------------------------------------------------------------------------------------------ Breast Cancer Surveillance: 1. Breast Exam: 03/22/2022: Benign 2. Mammogram and ultrasound: 09/05/2022: Probably benign findings retroareolar aspect of right breast favored to represent benign fat necrosis   Shortness of breath: Same as before She does water aerobics and appears to be handling that very well.   RTC in 1 year for follow up

## 2023-04-13 DIAGNOSIS — Z23 Encounter for immunization: Secondary | ICD-10-CM | POA: Diagnosis not present

## 2023-05-18 DIAGNOSIS — M810 Age-related osteoporosis without current pathological fracture: Secondary | ICD-10-CM | POA: Diagnosis not present

## 2023-05-18 DIAGNOSIS — I700 Atherosclerosis of aorta: Secondary | ICD-10-CM | POA: Diagnosis not present

## 2023-05-18 DIAGNOSIS — R7989 Other specified abnormal findings of blood chemistry: Secondary | ICD-10-CM | POA: Diagnosis not present

## 2023-05-18 DIAGNOSIS — E7800 Pure hypercholesterolemia, unspecified: Secondary | ICD-10-CM | POA: Diagnosis not present

## 2023-05-22 DIAGNOSIS — H43813 Vitreous degeneration, bilateral: Secondary | ICD-10-CM | POA: Diagnosis not present

## 2023-05-22 DIAGNOSIS — H353122 Nonexudative age-related macular degeneration, left eye, intermediate dry stage: Secondary | ICD-10-CM | POA: Diagnosis not present

## 2023-05-22 DIAGNOSIS — H353211 Exudative age-related macular degeneration, right eye, with active choroidal neovascularization: Secondary | ICD-10-CM | POA: Diagnosis not present

## 2023-05-25 DIAGNOSIS — Z1339 Encounter for screening examination for other mental health and behavioral disorders: Secondary | ICD-10-CM | POA: Diagnosis not present

## 2023-05-25 DIAGNOSIS — R82998 Other abnormal findings in urine: Secondary | ICD-10-CM | POA: Diagnosis not present

## 2023-05-25 DIAGNOSIS — H3530 Unspecified macular degeneration: Secondary | ICD-10-CM | POA: Diagnosis not present

## 2023-05-25 DIAGNOSIS — R0609 Other forms of dyspnea: Secondary | ICD-10-CM | POA: Diagnosis not present

## 2023-05-25 DIAGNOSIS — E7800 Pure hypercholesterolemia, unspecified: Secondary | ICD-10-CM | POA: Diagnosis not present

## 2023-05-25 DIAGNOSIS — Z853 Personal history of malignant neoplasm of breast: Secondary | ICD-10-CM | POA: Diagnosis not present

## 2023-05-25 DIAGNOSIS — Z96642 Presence of left artificial hip joint: Secondary | ICD-10-CM | POA: Diagnosis not present

## 2023-05-25 DIAGNOSIS — C50211 Malignant neoplasm of upper-inner quadrant of right female breast: Secondary | ICD-10-CM | POA: Diagnosis not present

## 2023-05-25 DIAGNOSIS — Z170 Estrogen receptor positive status [ER+]: Secondary | ICD-10-CM | POA: Diagnosis not present

## 2023-05-25 DIAGNOSIS — C3491 Malignant neoplasm of unspecified part of right bronchus or lung: Secondary | ICD-10-CM | POA: Diagnosis not present

## 2023-05-25 DIAGNOSIS — Z1331 Encounter for screening for depression: Secondary | ICD-10-CM | POA: Diagnosis not present

## 2023-05-25 DIAGNOSIS — Z0000 Encounter for general adult medical examination without abnormal findings: Secondary | ICD-10-CM | POA: Diagnosis not present

## 2023-05-25 DIAGNOSIS — I700 Atherosclerosis of aorta: Secondary | ICD-10-CM | POA: Diagnosis not present

## 2023-07-04 ENCOUNTER — Encounter (HOSPITAL_BASED_OUTPATIENT_CLINIC_OR_DEPARTMENT_OTHER): Payer: Self-pay | Admitting: Pulmonary Disease

## 2023-07-04 ENCOUNTER — Ambulatory Visit (HOSPITAL_BASED_OUTPATIENT_CLINIC_OR_DEPARTMENT_OTHER): Payer: Medicare Other | Admitting: Pulmonary Disease

## 2023-07-04 VITALS — BP 118/66 | HR 90 | Temp 97.6°F | Ht 65.0 in | Wt 130.0 lb

## 2023-07-04 DIAGNOSIS — R0609 Other forms of dyspnea: Secondary | ICD-10-CM

## 2023-07-04 DIAGNOSIS — J9611 Chronic respiratory failure with hypoxia: Secondary | ICD-10-CM | POA: Diagnosis not present

## 2023-07-04 NOTE — Progress Notes (Addendum)
Subjective:   PATIENT ID: [name] French GENDER: female DOB: 11-28-1926, MRN: [id number]  Chief Complaint  Patient presents with   Pulmonary Consult    Referred by Dr. Wylene Simmer. Pt c/o SOB over the past 4 years, worse since Winter 2023.  She gets winded walking short distances such as lobby to exam room today- esp worse if she is carrying something. She has occ cough and PND. Cough is non prod.     Reason for Visit: New consult for shortness of breath  [name] French is a 89 year old female former smoker with right breast cancer with relapse 2021 s/p lumpectomy and chemotherapy, right NSCLC s/p radiation in 2018, chronic left pleural effusion. HTN, HLD who presents for evaluation for shortness of breath.  She reports no symptom at rest however as significant shortness of breath with moderate distances. Uses a walker at [facility]. Has associated with wheezing. Has post nasal drip associated with cough. She has been seen by Cardiology in Feb 2024 and was recommended to take lasix 20 mg daily but she takes it 3-4 days out of the week and only will restart when she has lower extremity swelling R>L. She has a chronic pleural effusion related to her lung cancer that has been present for >4 years but it has not previously debilitated. She reports progressively worsening shortness of breath in th last six months. Previously was participating water aerobics last winter. Now not doing any activity. Denies illness prior to this.  Social History: Quit smoking 44 years ago.  I have personally reviewed patient's past medical/family/social history, allergies, current medications.  Past Medical History:  Diagnosis Date   Allergic rhinitis    Anemia    during pregnancy   Arthritis    Breast cancer (HCC) 05/08/13   right upper inner, invasive mammary   Breast cancer, right breast (HCC) 04/16/2013   Underwent lumpectomy on 11/06/13. 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   Use of letrozole (Femara)    neoadjuvant antiestrogen therapy with letrozole 2.5 mg daily x 7 monhts     Family History  Problem Relation Age of Onset   Heart disease Mother    Prostate cancer Father    Lung cancer Sister      Social History   Occupational History   Occupation: Retired  Tobacco Use   Smoking status: Former    Current packs/day: 0.00    Average packs/day: 0.5 packs/day for 25.0 years (12.5 ttl pk-yrs)    Types: Cigarettes    Start date: 12/19/1953    Quit date: 12/19/1978    Years since quitting: 44.5   Smokeless tobacco: Never  Vaping Use   Vaping status: Never Used  Substance and Sexual Activity   Alcohol use: Yes    Comment: "wine usually, no taste in the last in past few weeks- (02/07/2020)   Drug use: No  Sexual activity: Not Currently    Comment: menarche at age14, menopause in 64, HRT x 55, age at first live birth 54's, G70 P3    Allergies  Allergen Reactions   Other Anaphylaxis    Lobster- "years ago"   Codeine Itching    Insomnia    Hydrocodone Itching and Other (See Comments)    Insomnia   Neosporin [Neomycin-Bacitracin Zn-Polymyx] Rash     Outpatient Medications Prior to Visit  Medication Sig Dispense Refill   acetaminophen (TYLENOL) 500 MG tablet Take 1,000 mg by mouth every 6 (six) hours as needed for moderate pain or headache.     Artificial Tear Solution (BION TEARS OP) Place 1-2 drops into both eyes as needed (dry eyes).      aspirin 81 MG tablet Take 81 mg by mouth daily.     B Complex-C-Folic Acid (SUPER B COMPLEX/FA/VIT C) TABS  Take by mouth.     cetirizine (ZYRTEC) 10 MG tablet Take 10 mg by mouth daily.     Cholecalciferol (VITAMIN D-3) 1000 UNITS CAPS Take 1,000 Units daily by mouth.      doxylamine, Sleep, (UNISOM) 25 MG tablet Take 25 mg at bedtime by mouth.     famotidine (PEPCID) 20 MG tablet Take 20 mg by mouth daily.     furosemide (LASIX) 20 MG tablet Take 2 tablests ( 40 mg ) every morning as needed 90 tablet 3   metoprolol tartrate (LOPRESSOR) 25 MG tablet TAKE ONE TABLET TWICE DAILY (Patient taking differently: Take 1(25mg ) tablet morning 1/2(12.5mg ) tablet at bedtime.) 180 tablet 3   Multiple Vitamins-Minerals (PRESERVISION AREDS 2) CAPS Take 2 tablets by mouth daily.     nitroGLYCERIN (NITROSTAT) 0.4 MG SL tablet Place 0.4 mg under the tongue every 5 (five) minutes as needed for chest pain.     pravastatin (PRAVACHOL) 40 MG tablet Take 40 mg daily by mouth.      Probiotic Product (ALIGN) 4 MG CAPS Take 4 mg daily by mouth.     zolpidem (AMBIEN) 5 MG tablet Take 5 mg by mouth at bedtime as needed.     Glucosamine-Chondroit-MSM-C-Mn CAPS Take 1 capsule by mouth daily.     No facility-administered medications prior to visit.    Review of Systems  Constitutional:  Negative for chills, diaphoresis, fever, malaise/fatigue and weight loss.  HENT:  Positive for congestion.   Respiratory:  Positive for shortness of breath. Negative for cough, hemoptysis, sputum production and wheezing.   Cardiovascular:  Negative for chest pain, palpitations and leg swelling.     Objective:   Vitals:   07/04/23 1050  BP: 118/66  Pulse: 90  Temp: 97.6 F (36.4 C)  TempSrc: Oral  SpO2: 94%  Weight: 130 lb (59 kg)  Height: 5\' 5"  (1.651 m)   SpO2: 94 % (on RA) O2 Device: None (Room air)  Physical Exam: General: Thin, elderly-appearing female, no acute distress HENT: [place], AT Eyes: EOMI, no scleral icterus Respiratory: Clear to auscultation bilaterally.  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 No PE. Dense LLL consolidation. Unchanged loculated left pleural effusion. Chronic since 2019 in setting of lung cancer  PFT: None on file  Labs: CBC    Component Value Date/Time   WBC 6.6 12/22/2022 1558   WBC 10.1 07/26/2021 1026   RBC 4.55  12/22/2022 1558   RBC 4.92 07/26/2021 1026   HGB 13.1 12/22/2022 1558   HGB 14.4 04/13/2015 0946   HCT 39.6 12/22/2022 1558   HCT 44.2 04/13/2015 0946   PLT 238 12/22/2022 1558   MCV 87 12/22/2022 1558   MCV 90.2 04/13/2015 0946   MCH 28.8 12/22/2022 1558   MCH 28.5 07/26/2021 1026   MCHC 33.1 12/22/2022 1558   MCHC 33.2 07/26/2021 1026   RDW 13.4 12/22/2022 1558   RDW 13.7 04/13/2015 0946   LYMPHSABS 0.8 12/22/2022 1558   LYMPHSABS 1.8 04/13/2015 0946   MONOABS 0.5 09/14/2020 1000   MONOABS 0.4 04/13/2015 0946   EOSABS 0.2 12/22/2022 1558   BASOSABS 0.1 12/22/2022 1558   BASOSABS 0.1 04/13/2015 0946   Absolute eos 12/22/22 - 2000  SATURATION QUALIFICATIONS: (This note is used to comply with regulatory documentation for home oxygen) 07/04/23 [name] French, CMA   Patient Saturations on Room Air at Rest = 96%   Patient Saturations on Room Air while Ambulating = 88%   Patient Saturations on 2 Liters of oxygen while Ambulating = 98%       Assessment & Plan:   Discussion: 89 year old female former smoker with right breast cancer with relapse 2021 s/p lumpectomy and chemotherapy, right NSCLC s/p radiation in 2018, chronic left pleural effusion. HTN, HLD who presents for evaluation for shortness of breath. Ambulatory O2 with desaturations.  Suspect hypoxemia multifactorial in setting of chronic pleural effusion, deconditioning and suspected secondary pulmonary hypertension. Doubt PAH in setting of chronic lung  disease. CT with no stigmata of ILD. Her overall conditions are not reversible and best management would be oxygen support.  Chronic hypoxemic respiratory failure - new Dyspnea on exertion Chronic pleural effusion Muscular deconditioning --START wearing 2L oxygen with activity and sleep  >We will arrange for a medical supply company to contact you --ORDER echocardiogram   Health Maintenance Immunization History  Administered Date(s) Administered   Fluad Quad(high Dose 65+) 09/14/2020   Influenza, High Dose Seasonal PF 10/06/2017   Influenza-Unspecified 09/18/2012, 09/27/2013, 09/24/2014, 10/01/2016   Moderna SARS-COV2 Booster Vaccination 05/21/2021   Moderna Sars-Covid-2 Vaccination 12/28/2019, 01/29/2020   Pneumococcal Conjugate-13 03/18/2014   Pneumococcal Polysaccharide-23 12/04/2008, 02/19/2013   Td 12/04/2008, 04/11/2018   Zoster, Live 12/04/2008   CT Lung Screen - not qualified  Orders Placed This Encounter  Procedures   Ambulatory Referral for DME    Referral Priority:   Routine    Referral Type:   Durable Medical Equipment Purchase    Number of Visits Requested:   1   ECHOCARDIOGRAM COMPLETE    Standing Status:   Future    Standing Expiration Date:   07/03/2024    Scheduling Instructions:     Next available please    Order Specific Question:   Where should this test be performed    Answer:   [facility])    Order Specific Question:   Does the patient weigh less than or greater than 250 lbs?    Answer:   Patient weighs less than 250 lbs    Order Specific Question:   Perflutren DEFINITY (image enhancing agent) should be administered unless hypersensitivity or allergy exist    Answer:   Administer Perflutren    Order Specific Question:   Is a special reader required? (athlete or structural heart)    Answer:   No    Order Specific Question:   Reason for exam-Echo    Answer:   Dyspnea  R06.00  No orders of the defined types were placed in this  encounter.   Return in about 2 months (around 09/04/2023).  I have spent a total time of 45-minutes on the day of the appointment reviewing prior documentation, coordinating care and discussing medical diagnosis and plan with the patient/family. Imaging, labs and tests included in this note have been reviewed and interpreted independently by me.  Jeniah Kishi Mechele Collin, MD [facility] 07/04/2023 12:32 PM  Office Number [phone]

## 2023-07-04 NOTE — Patient Instructions (Addendum)
Chronic hypoxemic respiratory failure - new Dyspnea on exertion Chronic pleural effusion Muscular deconditioning --START wearing 2L oxygen with activity and sleep  >We will arrange for a medical supply company to contact you --ORDER echocardiogram

## 2023-07-11 ENCOUNTER — Telehealth: Payer: Self-pay | Admitting: Pulmonary Disease

## 2023-07-11 NOTE — Telephone Encounter (Signed)
Called patient. She reports no one has contacted her at all. Both home and cell number are good numbers to reach her.  Message sent to [facility]

## 2023-07-11 NOTE — Telephone Encounter (Signed)
Pt states she was here a week ago and hasn't heard anything since and she has some questions about her situation.

## 2023-07-12 NOTE — Telephone Encounter (Signed)
On the day of our clinic visit on 07/04/23 we only had time to perform ambulatory testing on continuous oxygen during our initial consult visit.  I had previously discussed with patient on the phone 07/11/23 that as a new start for oxygen dependence she would receive various oxygen tank sizes.  She at that time relayed to me her concerns regarding the canister sizes and ability to ambulate. We discussed that at our next visit we would work on qualifying her for [employer] device once we determined her oxygen dependence was chronic. She had expressed understanding on that phone call that this was the plan.  Please contact patient for sooner appointment for ambulatory O2 on POC device now.

## 2023-07-12 NOTE — Telephone Encounter (Signed)
Patient states equipment was delivered last night. However, she was not given a POC due to [facility] "not ordering one." Patient is upset as she cannot handle a regular 02 canister-it's too heavy for her. She is asking for a POC to be ordered and delivered to her. Please advise.

## 2023-07-14 NOTE — Telephone Encounter (Signed)
Patient called back stating that she has an appointment with [employer] on 7/30 to get qualified for POC.

## 2023-07-14 NOTE — Telephone Encounter (Signed)
Great. Will close encounter then.

## 2023-07-20 ENCOUNTER — Other Ambulatory Visit (HOSPITAL_COMMUNITY): Payer: Medicare Other

## 2023-07-28 ENCOUNTER — Telehealth (HOSPITAL_BASED_OUTPATIENT_CLINIC_OR_DEPARTMENT_OTHER): Payer: Self-pay | Admitting: Pulmonary Disease

## 2023-07-28 NOTE — Telephone Encounter (Signed)
Pt . Has a poc and a reg.O2 and needs advice about her O2 she sates she has no energy with the O2 and giving her anxiety issues please advise pt.

## 2023-07-28 NOTE — Telephone Encounter (Signed)
Called and spoke w/ pt  she said that she notice over the last couple of weeks that she has been feeling tired and no energy. She said when walking she has stop for few minutes b/c so she get breath. Pt said that she is using 3 liter w/ active and 2 liter will sitting. Pt said that she hasn't been checking her o2 level to see any changes. Had patient to check  O2 will on the phone  93% w/ 2 liter  room air . Pt said that she has some drainage down the back of her throat , and post nasal drip , no fever, some bodyache she wasn't if  was muscle pain. Some tightness in her chest and sob w/ active . Please advise

## 2023-07-28 NOTE — Telephone Encounter (Signed)
She reports worsening shortness of breath but unchanged O2 requirement of 2-3L. Has had worsening lower extremity edema which she has taken lasix 40 mg for the last 3 days with only some improvement.  I have advised her to continue lasix as directed and to monitor her weight. Also advised her to contact her PCP for appointment to labs and evaluation since our office does not have availability at this time. Advised her to present to ED for evaluation if respiratory status worsens or if oxygenation <88% on her prescribed oxygen dosing. She expresses understanding and agrees to plan.

## 2023-07-31 DIAGNOSIS — R069 Unspecified abnormalities of breathing: Secondary | ICD-10-CM | POA: Diagnosis not present

## 2023-07-31 DIAGNOSIS — C3491 Malignant neoplasm of unspecified part of right bronchus or lung: Secondary | ICD-10-CM | POA: Diagnosis not present

## 2023-07-31 DIAGNOSIS — I700 Atherosclerosis of aorta: Secondary | ICD-10-CM | POA: Diagnosis not present

## 2023-07-31 DIAGNOSIS — R0600 Dyspnea, unspecified: Secondary | ICD-10-CM | POA: Diagnosis not present

## 2023-07-31 DIAGNOSIS — E875 Hyperkalemia: Secondary | ICD-10-CM | POA: Diagnosis not present

## 2023-07-31 DIAGNOSIS — R600 Localized edema: Secondary | ICD-10-CM | POA: Diagnosis not present

## 2023-07-31 DIAGNOSIS — R0609 Other forms of dyspnea: Secondary | ICD-10-CM | POA: Diagnosis not present

## 2023-08-01 ENCOUNTER — Ambulatory Visit (HOSPITAL_COMMUNITY): Payer: Medicare Other | Attending: Cardiology

## 2023-08-01 DIAGNOSIS — J9611 Chronic respiratory failure with hypoxia: Secondary | ICD-10-CM | POA: Diagnosis not present

## 2023-08-01 DIAGNOSIS — R0609 Other forms of dyspnea: Secondary | ICD-10-CM | POA: Insufficient documentation

## 2023-08-01 LAB — ECHOCARDIOGRAM COMPLETE
AR max vel: 1.01 cm2
AV Area VTI: 0.98 cm2
AV Area mean vel: 0.96 cm2
AV Mean grad: 5 mmHg
AV Peak grad: 8.3 mmHg
Ao pk vel: 1.44 m/s
Area-P 1/2: 4.84 cm2
MV M vel: 4.56 m/s
MV Peak grad: 83.2 mmHg
MV VTI: 1.12 cm2
P 1/2 time: 266 msec
S' Lateral: 2.1 cm

## 2023-08-07 DIAGNOSIS — H353211 Exudative age-related macular degeneration, right eye, with active choroidal neovascularization: Secondary | ICD-10-CM | POA: Diagnosis not present

## 2023-08-08 DIAGNOSIS — E875 Hyperkalemia: Secondary | ICD-10-CM | POA: Diagnosis not present

## 2023-08-08 DIAGNOSIS — R0609 Other forms of dyspnea: Secondary | ICD-10-CM | POA: Diagnosis not present

## 2023-08-08 DIAGNOSIS — E8779 Other fluid overload: Secondary | ICD-10-CM | POA: Diagnosis not present

## 2023-08-08 DIAGNOSIS — R600 Localized edema: Secondary | ICD-10-CM | POA: Diagnosis not present

## 2023-08-08 DIAGNOSIS — C3491 Malignant neoplasm of unspecified part of right bronchus or lung: Secondary | ICD-10-CM | POA: Diagnosis not present

## 2023-08-08 DIAGNOSIS — I700 Atherosclerosis of aorta: Secondary | ICD-10-CM | POA: Diagnosis not present

## 2023-08-10 ENCOUNTER — Other Ambulatory Visit (HOSPITAL_COMMUNITY): Payer: Self-pay | Admitting: Pulmonary Disease

## 2023-08-10 DIAGNOSIS — E875 Hyperkalemia: Secondary | ICD-10-CM

## 2023-08-11 ENCOUNTER — Other Ambulatory Visit: Payer: Self-pay | Admitting: Pulmonary Disease

## 2023-08-11 DIAGNOSIS — E875 Hyperkalemia: Secondary | ICD-10-CM | POA: Diagnosis not present

## 2023-08-12 ENCOUNTER — Telehealth: Payer: Self-pay | Admitting: Pulmonary Disease

## 2023-08-12 LAB — BASIC METABOLIC PANEL
BUN/Creatinine Ratio: 24 (ref 12–28)
BUN: 17 mg/dL (ref 10–36)
CO2: 37 mmol/L — ABNORMAL HIGH (ref 20–29)
Calcium: 9.6 mg/dL (ref 8.7–10.3)
Chloride: 94 mmol/L — ABNORMAL LOW (ref 96–106)
Creatinine, Ser: 0.7 mg/dL (ref 0.57–1.00)
Glucose: 99 mg/dL (ref 70–99)
Potassium: 4.1 mmol/L (ref 3.5–5.2)
Sodium: 143 mmol/L (ref 134–144)
eGFR: 80 mL/min/{1.73_m2} (ref >59)

## 2023-08-12 NOTE — Telephone Encounter (Signed)
Note created in error this has already been handled

## 2023-08-12 NOTE — Telephone Encounter (Signed)
Hebert Soho; Sharene Butters, [place]; [facility], [place]; Kathe Becton; 1 other Good afternoon! Dr. Everardo All did not notate the patient's sats in their office note and the provider didn't cosign the sat testing from 7/16. It's been kicked back. We need Dr. Everardo All to either cosign the sats or make an addendum in their 7/16 office visit to include them. Please.  Thanks!

## 2023-08-20 NOTE — Progress Notes (Unsigned)
Cardiology Clinic Note   Patient Name: [name] French Date of Encounter: 08/22/2023  Primary Care Provider:  Gaspar Garbe, MD Primary Cardiologist:  Peter Swaziland, MD  Patient Profile    89 year old female with history of secondary hypertension, coronary artery disease, pulmonary hypertension and tachycardia, other history to include lung cancer with radiation therapy not found to be a candidate for chemotherapy or surgery and recurrence of breast cancer followed by oncology.    Last seen by Dr. Swaziland on 01/27/2023.  At that time she was complaining of worsening shortness of breath.  He felt there was a component of diastolic CHF.  She was continued on Lasix.  Has seen primary care provider in July 2024 and oxygen was placed at 2 L to assist with breathing status.  Past Medical History    Past Medical History:  Diagnosis Date   Allergic rhinitis    Anemia    during pregnancy   Arthritis    Breast cancer (HCC) 05/08/13   right upper inner, invasive mammary   Breast cancer, right breast (HCC) 04/16/2013   Underwent lumpectomy on 11/06/13. 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   Use of letrozole (Femara)    neoadjuvant antiestrogen therapy with letrozole 2.5 mg daily x 7 monhts   Past Surgical History:  Procedure Laterality Date   BREAST BIOPSY Left 1960   lt br bx/benign   BREAST LUMPECTOMY Right    BREAST LUMPECTOMY Right 02/10/2020   Procedure: RIGHT BREAST LUMPECTOMY;  Surgeon: Abigail Miyamoto, MD;  Location: [facility];  Service: General;   Laterality: Right;   BREAST LUMPECTOMY WITH NEEDLE LOCALIZATION Bilateral 11/06/2013   Procedure: BREAST LUMPECTOMY WITH NEEDLE LOCALIZATION;  Surgeon: Currie Paris, MD;  Location: [facility];  Service: General;  Laterality: Bilateral;   COLONOSCOPY     EYE SURGERY Bilateral    both cataracts   MOHS SURGERY Right    nose basal/squamous   TOTAL HIP ARTHROPLASTY Left 08/31/2018   Procedure: LEFT TOTAL HIP ARTHROPLASTY ANTERIOR APPROACH;  Surgeon: Kathryne Hitch, MD;  Location: [facility];  Service: Orthopedics;  Laterality: Left;   VIDEO BRONCHOSCOPY WITH ENDOBRONCHIAL NAVIGATION N/A 11/01/2017   Procedure: VIDEO BRONCHOSCOPY WITH ENDOBRONCHIAL NAVIGATION;  Surgeon: Loreli Slot, MD;  Location: [facility];  Service: Thoracic;  Laterality: N/A;   WRIST SURGERY  1990   lt    Allergies  Allergies  Allergen Reactions   Other Anaphylaxis    Lobster- "years ago"   Codeine Itching    Insomnia    Hydrocodone Itching and Other (See Comments)    Insomnia   Neosporin [Neomycin-Bacitracin Zn-Polymyx] Rash    History of Present Illness    Mrs. Crable returns to the office today for ongoing assessment and management of hypertension, hyperlipidemia, with history of dyspnea likely related to diastolic heart failure, with chronic sinus tachycardia.  Last seen in the  office 6 months ago by Dr. Swaziland.  He continues to be followed by [facility] pulmonology for pulmonary hypertension.  The patient is now on oxygen via nasal cannula at 2 L.  She comes today stating she is feeling worse concerning her breathing.  Her daughter is with her who states that she feels like she is breathing better.  Especially when she is speaking.  She has also been complaining of chronic lower extremity edema and has been taking Lasix but now feels she is dehydrated.  Doses have been adjusted by primary care.  She is currently taking 20 mg daily with an extra dose of 20 mg for lower extremity edema.   Also metoprolol has been changed to metoprolol tartrate 25 mg in a.m. and 12.5 mg in the p.m.  This is because she has been feeling tired on the medication.  She is very sedentary stating she does not have a lot of energy.  She sits mostly and tries to keep her feet elevated.  She is tolerating wearing oxygen.  Denies any dyspnea on exertion although she is not walking around very much.  She and her daughter have several questions about her heart function, echocardiogram, and pulmonary hypertension.  Home Medications    Current Outpatient Medications  Medication Sig Dispense Refill   acetaminophen (TYLENOL) 500 MG tablet Take 1,000 mg by mouth every 6 (six) hours as needed for moderate pain or headache.     Artificial Tear Solution (BION TEARS OP) Place 1-2 drops into both eyes as needed (dry eyes).      aspirin 81 MG tablet Take 81 mg by mouth daily.     B Complex-C-Folic Acid (SUPER B COMPLEX/FA/VIT C) TABS Take by mouth.     cetirizine (ZYRTEC) 10 MG tablet Take 10 mg by mouth daily.     Cholecalciferol (VITAMIN D-3) 1000 UNITS CAPS Take 1,000 Units daily by mouth.      doxylamine, Sleep, (UNISOM) 25 MG tablet Take 25 mg at bedtime by mouth.     famotidine (PEPCID) 20 MG tablet Take 20 mg by mouth daily.     furosemide (LASIX) 20 MG tablet Take 20 mg by mouth daily. Take 1 tablet daily and take additional tablet as needed for swelling and edema. 90 tablet 3   metoprolol tartrate (LOPRESSOR) 25 MG tablet TAKE ONE TABLET TWICE DAILY (Patient taking differently: Take 37.5 mg by mouth 2 (two) times daily. Take 1(25mg ) tablet morning 1/2(12.5mg ) tablet at bedtime.) 180 tablet 3   Multiple Vitamins-Minerals (PRESERVISION AREDS 2) CAPS Take 2 tablets by mouth daily.     pravastatin (PRAVACHOL) 40 MG tablet Take 40 mg daily by mouth.      Probiotic Product (ALIGN) 4 MG CAPS Take 4 mg daily by mouth.     zolpidem (AMBIEN) 5 MG tablet Take 5 mg by mouth at bedtime as needed.     nitroGLYCERIN  (NITROSTAT) 0.4 MG SL tablet Place 0.4 mg under the tongue every 5 (five) minutes as needed for chest pain. (Patient not taking: Reported on 08/22/2023)     No current facility-administered medications for this visit.     Family History    Family History  Problem Relation Age of Onset   Heart disease Mother    Prostate cancer Father    Lung cancer Sister    She indicated that her mother is deceased. She indicated that her father is deceased. She indicated that one of her two sisters is deceased. She indicated that her maternal  grandmother is deceased. She indicated that her maternal grandfather is deceased. She indicated that her paternal grandmother is deceased. She indicated that her paternal grandfather is deceased.  Social History    Social History   Socioeconomic History   Marital status: Divorced    Spouse name: Not on file   Number of children: 2   Years of education: Not on file   Highest education level: Not on file  Occupational History   Occupation: Retired  Tobacco Use   Smoking status: Former    Current packs/day: 0.00    Average packs/day: 0.5 packs/day for 25.0 years (12.5 ttl pk-yrs)    Types: Cigarettes    Start date: 12/19/1953    Quit date: 12/19/1978    Years since quitting: 44.7   Smokeless tobacco: Never  Vaping Use   Vaping status: Never Used  Substance and Sexual Activity   Alcohol use: Yes    Comment: "wine usually, no taste in the last in past few weeks- (02/07/2020)   Drug use: No   Sexual activity: Not Currently    Comment: menarche at age14, menopause in 24, HRT x 68, age at first live birth 46's, G31 P3  Other Topics Concern   Not on file  Social History Narrative   Not on file   Social Determinants of Health   Financial Resource Strain: Not on file  Food Insecurity: Not on file  Transportation Needs: Not on file  Physical Activity: Not on file  Stress: Not on file  Social Connections: Not on file  Intimate Partner Violence: Not At Risk  (10/04/2018)   Humiliation, Afraid, Rape, and Kick questionnaire    Fear of Current or Ex-Partner: No    Emotionally Abused: No    Physically Abused: No    Sexually Abused: No     Review of Systems    General:  No chills, fever, night sweats or weight changes.  Cardiovascular:  No chest pain, dyspnea on exertion, positive for dependent edema, orthopnea, palpitations, paroxysmal nocturnal dyspnea. Dermatological: No rash, lesions/masses Respiratory: No cough, continued dyspnea Urologic: No hematuria, dysuria Abdominal:   No nausea, vomiting, diarrhea, bright red blood per rectum, melena, or hematemesis Neurologic:  No visual changes, wkns, changes in mental status. All other systems reviewed and are otherwise negative except as noted above.       Physical Exam    VS:  BP 102/68   Pulse 95   Ht 5\' 5"  (1.651 m)   Wt 134 lb 12.8 oz (61.1 kg)   SpO2 93%   BMI 22.43 kg/m  , BMI Body mass index is 22.43 kg/m.     GEN: Well nourished, well developed, in no acute distress. HEENT: normal. Neck: Supple, no JVD, carotid bruits, or masses. Cardiac: RRR, tachycardic, no murmurs, rubs, or gallops. No clubbing, cyanosis, mild dependent edema.  Radials/DP/PT 2+ and equal bilaterally.  Respiratory:  Respirations regular and unlabored, clear to auscultation bilaterally. GI: Soft, nontender, nondistended, BS + x 4. MS: no deformity or atrophy. Skin: warm and dry, no rash. Neuro:  Strength and sensation are intact.  Hard of hearing Psych: Normal affect.      Lab Results  Component Value Date   WBC 6.6 12/22/2022   HGB 13.1 12/22/2022   HCT 39.6 12/22/2022   MCV 87 12/22/2022   PLT 238 12/22/2022   Lab Results  Component Value Date   CREATININE 0.70 08/11/2023   BUN 17 08/11/2023   NA 143 08/11/2023   K 4.1  08/11/2023   CL 94 (L) 08/11/2023   CO2 37 (H) 08/11/2023   Lab Results  Component Value Date   ALT 9 07/26/2021   AST 12 (L) 07/26/2021   ALKPHOS 53 07/26/2021    BILITOT 1.1 07/26/2021   Lab Results  Component Value Date   CHOL 158 12/26/2017   HDL 51 12/26/2017   LDLCALC 74 12/26/2017   TRIG 167 (H) 12/26/2017   CHOLHDL 3.1 12/26/2017    Lab Results  Component Value Date   HGBA1C 5.6 12/25/2017     Review of Prior Studies    Echocardiogram 08/01/2023 1. Left ventricular ejection fraction, by estimation, is 60 to 65%. The  left ventricle has normal function. The left ventricle has no regional  wall motion abnormalities. Left ventricular diastolic parameters are  indeterminate. There is the  interventricular septum is flattened in systole and diastole, consistent  with right ventricular pressure and volume overload.   2. Right ventricular systolic function is normal. The right ventricular  size is moderately enlarged. There is severely elevated pulmonary artery  systolic pressure. The estimated right ventricular systolic pressure is  62.9 mmHg.   3. Right atrial size was moderately dilated.   4. The mitral valve is normal in structure. Moderate to severe mitral  valve regurgitation. No evidence of mitral stenosis.   5. Tricuspid valve regurgitation is severe.   6. The aortic valve is normal in structure. There is mild calcification  of the aortic valve. There is mild thickening of the aortic valve. Aortic  valve regurgitation is mild. Aortic valve sclerosis is present, with no  evidence of aortic valve stenosis.   7. The inferior vena cava is dilated in size with <50% respiratory  variability, suggesting right atrial pressure of 15 mmHg.    Assessment & Plan   1.  Pulmonary hypertension: I am going to refer her to advanced heart failure clinic to see Dr. Shirlee Latch for ongoing management of pulmonary hypertension.  According echocardiogram she does have severe PAH, with severe tricuspid regurgitation.  She remains on oxygen via nasal cannula and has very low stamina.  2.  Secondary hypertension: Blood pressures currently well-controlled  on this office visit actually low normal.  I think is because she had been taking extra doses of Lasix for lower extremity edema.  Would continue just Lasix 20 mg daily.  Continue the metoprolol 25 mg in the a.m. and one half in the p.m.  She is slightly tachycardic today and has been in the past per office notes.  May need to increase the metoprolol to 25 twice daily for better heart rate control on follow-up if this continues.  3.  Chronic diastolic CHF: I advised her on a low-sodium diet, keeping her feet elevated is much as possible.  But she does need to get up and move to assist with stamina.  They verbalized understanding.  4.  Left upper lobe lung cancer: Not not a candidate for chemotherapy or surgery in the setting of recurrent breast cancer.  Continue medical management with oncology.         Signed, Bettey Mare. Liborio Nixon, ANP, AACC   08/22/2023 4:32 PM      Office [phone] Fax [phone]  Notice: This dictation was prepared with Dragon dictation along with smaller phrase technology. Any transcriptional errors that result from this process are unintentional and may not be corrected upon review.

## 2023-08-22 ENCOUNTER — Ambulatory Visit: Payer: Medicare Other | Attending: Adult Health | Admitting: Adult Health

## 2023-08-22 ENCOUNTER — Encounter: Payer: Self-pay | Admitting: Adult Health

## 2023-08-22 VITALS — BP 102/68 | HR 95 | Ht 65.0 in | Wt 134.8 lb

## 2023-08-22 DIAGNOSIS — I5032 Chronic diastolic (congestive) heart failure: Secondary | ICD-10-CM | POA: Diagnosis not present

## 2023-08-22 DIAGNOSIS — I272 Pulmonary hypertension, unspecified: Secondary | ICD-10-CM | POA: Diagnosis not present

## 2023-08-22 DIAGNOSIS — I159 Secondary hypertension, unspecified: Secondary | ICD-10-CM | POA: Diagnosis not present

## 2023-08-22 DIAGNOSIS — C3491 Malignant neoplasm of unspecified part of right bronchus or lung: Secondary | ICD-10-CM | POA: Diagnosis not present

## 2023-08-22 NOTE — Patient Instructions (Signed)
Medication Instructions:  NO CHANGES *If you need a refill on your cardiac medications before your next appointment, please call your pharmacy*   Lab Work: NO LABS If you have labs (blood work) drawn today and your tests are completely normal, you will receive your results only by: MyChart Message (if you have MyChart) OR A paper copy in the mail If you have any lab test that is abnormal or we need to change your treatment, we will call you to review the results.   Testing/Procedures: NO TESTING   Follow-Up: At [facility], you and your health needs are our priority.  As part of our continuing mission to provide you with exceptional heart care, we have created designated Provider Care Teams.  These Care Teams include your primary Cardiologist (physician) and Advanced Practice Providers (APPs -  Physician Assistants and Nurse Practitioners) who all work together to provide you with the care you need, when you need it.  We recommend signing up for the patient portal called "MyChart".  Sign up information is provided on this After Visit Summary.  MyChart is used to connect with patients for Virtual Visits (Telemedicine).  Patients are able to view lab/test results, encounter notes, upcoming appointments, etc.  Non-urgent messages can be sent to your provider as well.   To learn more about what you can do with MyChart, go to [email or web address].    Your next appointment:   3-4 month(s)  Provider:   Peter Swaziland, MD

## 2023-08-24 ENCOUNTER — Non-Acute Institutional Stay (SKILLED_NURSING_FACILITY): Payer: Medicare Other | Admitting: Adult Health

## 2023-08-24 ENCOUNTER — Encounter: Payer: Self-pay | Admitting: Adult Health

## 2023-08-24 DIAGNOSIS — J9611 Chronic respiratory failure with hypoxia: Secondary | ICD-10-CM

## 2023-08-24 DIAGNOSIS — C3491 Malignant neoplasm of unspecified part of right bronchus or lung: Secondary | ICD-10-CM

## 2023-08-24 DIAGNOSIS — J90 Pleural effusion, not elsewhere classified: Secondary | ICD-10-CM

## 2023-08-24 DIAGNOSIS — J310 Chronic rhinitis: Secondary | ICD-10-CM

## 2023-08-24 DIAGNOSIS — Z66 Do not resuscitate: Secondary | ICD-10-CM

## 2023-08-24 DIAGNOSIS — I5032 Chronic diastolic (congestive) heart failure: Secondary | ICD-10-CM

## 2023-08-24 DIAGNOSIS — I272 Pulmonary hypertension, unspecified: Secondary | ICD-10-CM

## 2023-08-24 MED ORDER — FAMOTIDINE 20 MG PO TABS: 20.0000 mg | ORAL_TABLET | Freq: Every day | ORAL | Status: AC

## 2023-08-24 MED ORDER — METOPROLOL TARTRATE 25 MG PO TABS: 25.0000 mg | ORAL_TABLET | Freq: Every morning | ORAL | Status: DC

## 2023-08-24 MED ORDER — FLUTICASONE PROPIONATE 50 MCG/ACT NA SUSP: 2.0000 | Freq: Every day | NASAL | 6 refills | Status: DC

## 2023-08-24 NOTE — Progress Notes (Addendum)
Location:  [facility] Room Number: 115 A Place of Service:  SNF [phone]) Provider:  Fletcher Anon, NP   Patient Care Team: Tisovec, Adelfa Koh, MD as PCP - General (Internal Medicine) [place], Peter M, MD as PCP - Cardiology (Cardiology) Serena Croissant, MD as Consulting Physician (Hematology and Oncology)  Extended Emergency Contact Information Primary Emergency Contact: [name] [place] Home Phone: [phone] Relation: Daughter Secondary Emergency Contact: [name] [place] Home Phone: [phone] Mobile Phone: [phone] Relation: Son  Code Status:  DNR Goals of care: Advanced Directive information    08/24/2023    2:54 PM  Advanced Directives  Does Patient Have a Medical Advance Directive? Yes  Type of Advance Directive Out of facility DNR (pink MOST or yellow form)  Does patient want to make changes to medical advance directive? No - Patient declined  Pre-existing out of facility DNR order (yellow form or pink MOST form) Yellow form placed in chart (order not valid for inpatient use)     Chief Complaint  Patient presents with   Acute Visit    Weakness    HPI:  Pt is a 89 y.o. female seen today for an acute visit for weakness. The clinic nurse reported she was requiring more assistance and needed a skilled level of care. Previously she resides in independent living and was admitted to skilled care for the above issue.  She also was reported to have low 02 sats but on arrival to skilled care her sat was 97% on 3 liters. She uses oxygen 2 liters and 3 on exertion typically  Weight is 134 which she states is stable Does report worsening edema Feeling more short of breath with exertion No sob at rest She reports lethargy with higher dosing of metoprolol.  Reports chronic nasal drainage clear, wants to try nasal spray   She saw cardiology on 08/22/23 and they recommended that she be referred  to the advanced CHF clinic. She is experiencing severe pulmonary htn, along with tricuspid regurgitation per echo 08/01/2023.   Other issues are a hx of breast ca on the right s/p bilateral lumpectomy in 2014 with recurrence in 2021 on the right s/p another right lumpectomy, anit estrogen therapy and chemo. 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  Use of letrozole (Femara)    neoadjuvant antiestrogen therapy with letrozole 2.5 mg daily x 7 monhts   Past Surgical History:  Procedure Laterality Date   BREAST BIOPSY Left 1960   lt br bx/benign   BREAST LUMPECTOMY Right    BREAST LUMPECTOMY Right 02/10/2020   Procedure: RIGHT BREAST LUMPECTOMY;  Surgeon: Abigail Miyamoto, MD;  Location: [facility] OR;  Service: General;  Laterality: Right;   BREAST LUMPECTOMY WITH NEEDLE LOCALIZATION Bilateral 11/06/2013   Procedure: BREAST LUMPECTOMY WITH NEEDLE LOCALIZATION;  Surgeon: Currie Paris, MD;  Location: [facility];  Service:  General;  Laterality: Bilateral;   COLONOSCOPY     EYE SURGERY Bilateral    both cataracts   MOHS SURGERY Right    nose basal/squamous   TOTAL HIP ARTHROPLASTY Left 08/31/2018   Procedure: LEFT TOTAL HIP ARTHROPLASTY ANTERIOR APPROACH;  Surgeon: Kathryne Hitch, MD;  Location: [facility];  Service: Orthopedics;  Laterality: Left;   VIDEO BRONCHOSCOPY WITH ENDOBRONCHIAL NAVIGATION N/A 11/01/2017   Procedure: VIDEO BRONCHOSCOPY WITH ENDOBRONCHIAL NAVIGATION;  Surgeon: Loreli Slot, MD;  Location: [facility] OR;  Service: Thoracic;  Laterality: N/A;   WRIST SURGERY  1990   lt    Allergies  Allergen Reactions   Other Anaphylaxis    Lobster- "years ago"   Codeine Itching    Insomnia    Hydrocodone Itching and Other (See Comments)    Insomnia   Neosporin [Neomycin-Bacitracin Zn-Polymyx] Rash    Outpatient Encounter Medications as of 08/24/2023  Medication Sig   acetaminophen (TYLENOL) 500 MG tablet Take 1,000 mg by mouth every 6 (six) hours as needed for moderate pain or headache.   anastrozole (ARIMIDEX) 1 MG tablet Take 0.5 mg by mouth daily. 8-11 am   Artificial Tear Solution (BION TEARS OP) Place 1-2 drops into both eyes as needed (dry eyes).    aspirin 81 MG tablet Take 81 mg by mouth daily.   B Complex-C-Folic Acid (SUPER B COMPLEX/FA/VIT C) TABS Take by mouth.   cetirizine (ZYRTEC) 10 MG tablet Take 10 mg by mouth daily.   Cholecalciferol (VITAMIN D-3) 1000 UNITS CAPS Take 1,000 Units daily by mouth.    guaifenesin (ROBITUSSIN) 100 MG/5ML syrup Take 100 mg by mouth every 4 (four) hours as needed for cough.   Menthol, Topical Analgesic, (BIOFREEZE PROFESSIONAL) 5 % GEL Apply 1 application  topically as needed (in the morning).   metoprolol tartrate (LOPRESSOR) 25 MG tablet Take 12.5 mg by mouth 2 (two) times daily.   Misc Natural Products (GLUCOSAMINE CHOND MSM FORMULA PO) Take 1 tablet by mouth daily.   naphazoline-pheniramine (NAPHCON-A) 0.025-0.3 % ophthalmic solution Place  1-2 drops into both eyes as needed for eye irritation or allergies.   nitroGLYCERIN (NITROSTAT) 0.4 MG SL tablet Place 0.4 mg under the tongue every 5 (five) minutes as needed for chest pain.   OXYGEN Inhale 3 L into the lungs as directed. Every Shift; Shift 1, Shift 2, Shift 3   Polyethyl Glycol-Propyl Glycol (SYSTANE OP) Apply 1 drop to eye as needed (both eyes).   pravastatin (PRAVACHOL) 40 MG tablet Take 40 mg daily by mouth.    Probiotic Product (ALIGN) 4 MG CAPS Take 4 mg daily by mouth.   traMADol (ULTRAM) 50 MG tablet Take 50 mg by mouth every 6 (six) hours as needed.   zolpidem (AMBIEN) 5 MG tablet Take 5 mg by mouth at bedtime as needed.   [DISCONTINUED] doxylamine, Sleep, (UNISOM) 25 MG tablet Take 25 mg at  bedtime by mouth.   [DISCONTINUED] famotidine (PEPCID) 20 MG tablet Take 20 mg by mouth daily.   [DISCONTINUED] furosemide (LASIX) 20 MG tablet Take 20 mg by mouth daily. Take 1 tablet daily and take additional tablet as needed for swelling and edema.   [DISCONTINUED] metoprolol tartrate (LOPRESSOR) 25 MG tablet TAKE ONE TABLET TWICE DAILY (Patient taking differently: Take 37.5 mg by mouth 2 (two) times daily. Take 1(25mg ) tablet morning 1/2(12.5mg ) tablet at bedtime.)   [DISCONTINUED] Multiple Vitamins-Minerals (PRESERVISION AREDS 2) CAPS Take 2 tablets by mouth daily.   No facility-administered encounter medications on file as of 08/24/2023.    Review of Systems  Constitutional:  Positive for activity change and fatigue. Negative for appetite change, chills, diaphoresis, fever and unexpected weight change.  HENT:  Positive for congestion.   Respiratory:  Positive for shortness of breath. Negative for cough and wheezing.   Cardiovascular:  Positive for leg swelling. Negative for chest pain and palpitations.  Gastrointestinal:  Negative for abdominal distention, abdominal pain, constipation and diarrhea.  Genitourinary:  Negative for difficulty urinating and dysuria.   Musculoskeletal:  Positive for gait problem. Negative for arthralgias, back pain, joint swelling and myalgias.  Neurological:  Negative for dizziness, tremors, seizures, syncope, facial asymmetry, speech difficulty, weakness, light-headedness, numbness and headaches.  Psychiatric/Behavioral:  Negative for agitation, behavioral problems and confusion.     Immunization History  Administered Date(s) Administered   Fluad Quad(high Dose 65+) 09/14/2020   Influenza, High Dose Seasonal PF 10/06/2017   Influenza-Unspecified 09/18/2012, 09/27/2013, 09/24/2014, 10/01/2016   Moderna SARS-COV2 Booster Vaccination 05/21/2021   Moderna Sars-Covid-2 Vaccination 12/28/2019, 01/29/2020   Pneumococcal Conjugate-13 03/18/2014   Pneumococcal Polysaccharide-23 12/04/2008, 02/19/2013   Td 12/04/2008, 04/11/2018   Zoster, Live 12/04/2008   Pertinent  Health Maintenance Due  Topic Date Due   Colonoscopy  12/07/2014   INFLUENZA VACCINE  07/20/2023   DEXA SCAN  Completed      07/26/2021    8:35 PM 07/27/2021    8:00 AM 02/01/2022   11:19 AM 03/22/2022   11:00 AM 08/03/2022    4:35 PM  Fall Risk  (RETIRED) Patient Fall Risk Level Moderate fall risk Moderate fall risk Low fall risk Low fall risk Low fall risk   Functional Status Survey:    Vitals:   08/24/23 1451  BP: 98/60  Pulse: 99  Resp: 14  Temp: 98 F (36.7 C)  SpO2: 97%  Weight: 134 lb 3.2 oz (60.9 kg)  Height: 5\' 5"  (1.651 m)   Body mass index is 22.33 kg/m. Physical Exam Vitals and nursing note reviewed.  Constitutional:      General: She is not in acute distress.    Appearance: She is not diaphoretic.  HENT:     Head: Normocephalic and atraumatic.     Nose: Nose normal.     Mouth/Throat:     Mouth: Mucous membranes are moist.     Pharynx: Oropharynx is clear.  Neck:     Vascular: No JVD.  Cardiovascular:     Rate and Rhythm: Normal rate and regular rhythm.     Heart sounds: No murmur heard. Pulmonary:     Effort: Pulmonary  effort is normal. No respiratory distress.     Breath sounds: No wheezing.     Comments: Decreased throughout Musculoskeletal:     Cervical back: No rigidity or tenderness.  Lymphadenopathy:     Cervical: No cervical adenopathy.  Skin:    General: Skin is warm and dry.  Comments: BLE edema pitting +1  Neurological:     Mental Status: She is alert and oriented to person, place, and time.     Labs reviewed: Recent Labs    12/22/22 1558 01/27/23 1044 08/11/23 1307  NA 129* 133* 143  K 4.6 4.3 4.1  CL 89* 90* 94*  CO2 24 30* 37*  GLUCOSE 98 109* 99  BUN 10 10 17   CREATININE 0.70 0.72 0.70  CALCIUM 9.3 9.5 9.6   No results for input(s): "AST", "ALT", "ALKPHOS", "BILITOT", "PROT", "ALBUMIN" in the last 8760 hours. Recent Labs    12/22/22 1558  WBC 6.6  NEUTROABS 4.7  HGB 13.1  HCT 39.6  MCV 87  PLT 238   Lab Results  Component Value Date   TSH 1.890 05/28/2020   Lab Results  Component Value Date   HGBA1C 5.6 12/25/2017   Lab Results  Component Value Date   CHOL 158 12/26/2017   HDL 51 12/26/2017   LDLCALC 74 12/26/2017   TRIG 167 (H) 12/26/2017   CHOLHDL 3.1 12/26/2017    Significant Diagnostic Results in last 30 days:  ECHOCARDIOGRAM COMPLETE  Result Date: 08/01/2023    ECHOCARDIOGRAM REPORT   Patient Name:   [name] French Date of Exam: 08/01/2023 Medical Rec #:  [id number]        Height:       65.0 in Accession #:    [id number]       Weight:       130.0 lb Date of Birth:  June 28, 1926        BSA:          1.647 m Patient Age:    89 years         BP:           118/66 mmHg Patient Gender: F                HR:           93 bpm. Exam Location:  [address] Procedure: 2D Echo, Cardiac Doppler and Color Doppler Indications:    R06.00 Dyspnea  History:        Patient has prior history of Echocardiogram examinations, most                 recent 09/10/2021. Arrythmias:Tachycardia,                 Signs/Symptoms:Dyspnea; Risk Factors:Family History of Coronary                  Artery Disease and Former Smoker. History of Lung Cancer (status                 post Radiation), History of Right Breast Cancer (2014 with                 Lumpectomy, Reoccurance 2021 with Lumpectomy).  Sonographer:    Farrel Conners RDCS Referring Phys: CHI JANE ELLISON IMPRESSIONS  1. Left ventricular ejection fraction, by estimation, is 60 to 65%. The left ventricle has normal function. The left ventricle has no regional wall motion abnormalities. Left ventricular diastolic parameters are indeterminate. There is the interventricular septum is flattened in systole and diastole, consistent with right ventricular pressure and volume overload.  2. Right ventricular systolic function is normal. The right ventricular size is moderately enlarged. There is severely elevated pulmonary artery systolic pressure. The estimated right ventricular systolic pressure is 62.9 mmHg.  3. Right atrial size was moderately dilated.  4.  The mitral valve is normal in structure. Moderate to severe mitral valve regurgitation. No evidence of mitral stenosis.  5. Tricuspid valve regurgitation is severe.  6. The aortic valve is normal in structure. There is mild calcification of the aortic valve. There is mild thickening of the aortic valve. Aortic valve regurgitation is mild. Aortic valve sclerosis is present, with no evidence of aortic valve stenosis.  7. The inferior vena cava is dilated in size with <50% respiratory variability, suggesting right atrial pressure of 15 mmHg. Comparison(s): Prior images reviewed side by side. FINDINGS  Left Ventricle: Left ventricular ejection fraction, by estimation, is 60 to 65%. The left ventricle has normal function. The left ventricle has no regional wall motion abnormalities. The left ventricular internal cavity size was normal in size. There is  no left ventricular hypertrophy. The interventricular septum is flattened in systole and diastole, consistent with right ventricular pressure and  volume overload. Left ventricular diastolic parameters are indeterminate. Right Ventricle: The right ventricular size is moderately enlarged. No increase in right ventricular wall thickness. Right ventricular systolic function is normal. There is severely elevated pulmonary artery systolic pressure. The tricuspid regurgitant velocity is 3.46 m/s, and with an assumed right atrial pressure of 15 mmHg, the estimated right ventricular systolic pressure is 62.9 mmHg. Left Atrium: Left atrial size was normal in size. Right Atrium: Right atrial size was moderately dilated. Pericardium: There is no evidence of pericardial effusion. Mitral Valve: The mitral valve is normal in structure. There is moderate thickening of the mitral valve leaflet(s). Moderate to severe mitral valve regurgitation. No evidence of mitral valve stenosis. MV peak gradient, 7.6 mmHg. The mean mitral valve gradient is 2.0 mmHg. Tricuspid Valve: The tricuspid valve is normal in structure. Tricuspid valve regurgitation is severe. No evidence of tricuspid stenosis. Aortic Valve: The aortic valve is normal in structure. There is mild calcification of the aortic valve. There is mild thickening of the aortic valve. Aortic valve regurgitation is mild. Aortic regurgitation PHT measures 266 msec. Aortic valve sclerosis is present, with no evidence of aortic valve stenosis. Aortic valve mean gradient measures 5.0 mmHg. Aortic valve peak gradient measures 8.3 mmHg. Aortic valve area, by VTI measures 0.98 cm. Pulmonic Valve: The pulmonic valve was normal in structure. Pulmonic valve regurgitation is mild. No evidence of pulmonic stenosis. Aorta: The aortic root is normal in size and structure. Venous: The inferior vena cava is dilated in size with less than 50% respiratory variability, suggesting right atrial pressure of 15 mmHg. IAS/Shunts: No atrial level shunt detected by color flow Doppler.  LEFT VENTRICLE PLAX 2D LVIDd:         3.20 cm   Diastology LVIDs:          2.10 cm   LV e' medial:    5.51 cm/s LV PW:         0.80 cm   LV E/e' medial:  24.6 LV IVS:        0.70 cm   LV e' lateral:   4.88 cm/s LVOT diam:     1.80 cm   LV E/e' lateral: 27.7 LV SV:         25 LV SV Index:   15 LVOT Area:     2.54 cm  RIGHT VENTRICLE RV Basal diam:  3.70 cm RV Mid diam:    3.50 cm RV S prime:     9.57 cm/s TAPSE (M-mode): 1.2 cm RVSP:           50.9  mmHg LEFT ATRIUM             Index        RIGHT ATRIUM           Index LA diam:        3.50 cm 2.12 cm/m   RA Pressure: 3.00 mmHg LA Vol (A2C):   47.0 ml 28.53 ml/m  RA Area:     17.55 cm LA Vol (A4C):   40.6 ml 24.65 ml/m  RA Volume:   51.90 ml  31.51 ml/m LA Biplane Vol: 44.4 ml 26.95 ml/m  AORTIC VALVE AV Area (Vmax):    1.01 cm AV Area (Vmean):   0.96 cm AV Area (VTI):     0.98 cm AV Vmax:           144.00 cm/s AV Vmean:          101.000 cm/s AV VTI:            0.251 m AV Peak Grad:      8.3 mmHg AV Mean Grad:      5.0 mmHg LVOT Vmax:         57.35 cm/s LVOT Vmean:        38.050 cm/s LVOT VTI:          0.097 m LVOT/AV VTI ratio: 0.39 AI PHT:            266 msec  AORTA Ao Root diam: 2.90 cm Ao Asc diam:  2.90 cm MITRAL VALVE                TRICUSPID VALVE MV Area (PHT): cm          TR Peak grad:   47.9 mmHg MV Area VTI:   1.12 cm     TR Vmax:        346.00 cm/s MV Peak grad:  7.6 mmHg     Estimated RAP:  3.00 mmHg MV Mean grad:  2.0 mmHg     RVSP:           50.9 mmHg MV Vmax:       1.38 m/s MV Vmean:      59.2 cm/s    SHUNTS MV Decel Time: 157 msec     Systemic VTI:  0.10 m MR Peak grad: 83.2 mmHg     Systemic Diam: 1.80 cm MR Mean grad: 55.0 mmHg MR Vmax:      456.00 cm/s MR Vmean:     354.0 cm/s MV E velocity: 135.33 cm/s MV A velocity: 50.63 cm/s MV E/A ratio:  2.67 Donato Schultz MD Electronically signed by Donato Schultz MD Signature Date/Time: 08/01/2023/4:05:35 PM    Final     Assessment/Plan  1. DNR (do not resuscitate) Order updated Pt may consider no hospitalization order. Her daughter is bringing documents regarding  this issue.    2. Severe pulmonary hypertension (HCC) Worsening symptoms over time with decreased stamina, increased edema, sob etc.  Will have nurse f/u on referral to advanced CHF clinic May increase lasix waiting on labs   3. Chronic diastolic congestive heart failure (HCC) Currently on lasix 20 mg daily Followed by cardiology   4. Non-small cell cancer of right lung [facility]) S/p radiation 2018 CT monitoring.   5. Tachycardia Consider increasing metoprolol for better rate control. Did have lethargy with higher dosing the past per pt BP soft and thinking of diuresing, weighting on labs before making changes and coordinating with cardiology   6. Chronic rhinitis Ordered flonase  Continue zyrtec  7. Chronic pleural effusion Loculated left effusion Followed by Pulmonary  8. Weakness PT and OT eval and tx.  Needs to work on energy conservation  9. 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 She is worsening in status but saturating well. Will reach out to pulmonary per staff messaging, while waiting on cardiology referral. Nurse at [facility] to call Dr.Mclean's office for apt.   Family/ staff Communication: discussed with pt and her daughter.   Labs/tests ordered:  CBC CMP TSH BNP

## 2023-08-25 ENCOUNTER — Telehealth: Payer: Self-pay | Admitting: Cardiology

## 2023-08-25 ENCOUNTER — Telehealth: Payer: Self-pay | Admitting: Adult Health

## 2023-08-25 DIAGNOSIS — I5032 Chronic diastolic (congestive) heart failure: Secondary | ICD-10-CM | POA: Diagnosis not present

## 2023-08-25 LAB — CBC AND DIFFERENTIAL
HCT: 31 — AB (ref 36–46)
Hemoglobin: 10.2 — AB (ref 12.0–16.0)
Platelets: 171 10*3/uL (ref 150–400)
WBC: 4.8

## 2023-08-25 LAB — BASIC METABOLIC PANEL
BUN: 17 (ref 4–21)
CO2: 31 — AB (ref 13–22)
Chloride: 94 — AB (ref 99–108)
Creatinine: 0.7 (ref 0.5–1.1)
Glucose: 98
Potassium: 5.4 meq/L — AB (ref 3.5–5.1)
Sodium: 139 (ref 137–147)

## 2023-08-25 LAB — COMPREHENSIVE METABOLIC PANEL
Albumin: 3.7 (ref 3.5–5.0)
Calcium: 9.7 (ref 8.7–10.7)
Globulin: 2.3
eGFR: 79

## 2023-08-25 LAB — CBC: RBC: 3.66 — AB (ref 3.87–5.11)

## 2023-08-25 LAB — TSH: TSH: 2.4 (ref 0.41–5.90)

## 2023-08-25 LAB — HEPATIC FUNCTION PANEL
ALT: 14 U/L (ref 7–35)
AST: 32 (ref 13–35)
Alkaline Phosphatase: 61 (ref 25–125)
Bilirubin, Total: 0.4

## 2023-08-25 MED ORDER — FUROSEMIDE 40 MG PO TABS: 40.0000 mg | ORAL_TABLET | Freq: Every day | ORAL | Status: DC

## 2023-08-25 NOTE — Telephone Encounter (Signed)
BNP returned 08/25/23 at 723. Prior reading 538.7.  She is experiencing increased edema, sob on exertion, fatigue.  Will increase lasix to 40 mg every day x 3 days CMP pending (k tends to run on the high side) TSH 2.4

## 2023-08-25 NOTE — Telephone Encounter (Signed)
[facility] is calling stating you referred patient to heart failure clinic. They tried to make appointment but stated she needs referral. I did not see that in AVS from OV in [place] or recent visit with [name] French.  Does she need to be seen by heart failure

## 2023-08-25 NOTE — Telephone Encounter (Signed)
Spoke to [place] at [facility].Advised patient saw Joni Reining DNP on 9/3 and a order was placed to see Advanced Heart Failure Clinic.Advised order is pending review,someone from that dept will call back with appointment.

## 2023-08-25 NOTE — Telephone Encounter (Signed)
[facility] need [facility] for the advanced heart failure clinic the number is [phone]. Please advise

## 2023-08-28 ENCOUNTER — Non-Acute Institutional Stay (SKILLED_NURSING_FACILITY): Payer: Medicare Other | Admitting: Internal Medicine

## 2023-08-28 ENCOUNTER — Encounter: Payer: Self-pay | Admitting: Internal Medicine

## 2023-08-28 DIAGNOSIS — R278 Other lack of coordination: Secondary | ICD-10-CM | POA: Diagnosis not present

## 2023-08-28 DIAGNOSIS — C3491 Malignant neoplasm of unspecified part of right bronchus or lung: Secondary | ICD-10-CM

## 2023-08-28 DIAGNOSIS — J310 Chronic rhinitis: Secondary | ICD-10-CM | POA: Diagnosis not present

## 2023-08-28 DIAGNOSIS — J90 Pleural effusion, not elsewhere classified: Secondary | ICD-10-CM

## 2023-08-28 DIAGNOSIS — R0602 Shortness of breath: Secondary | ICD-10-CM

## 2023-08-28 DIAGNOSIS — I5032 Chronic diastolic (congestive) heart failure: Secondary | ICD-10-CM

## 2023-08-28 DIAGNOSIS — J9601 Acute respiratory failure with hypoxia: Secondary | ICD-10-CM | POA: Diagnosis not present

## 2023-08-28 DIAGNOSIS — M6389 Disorders of muscle in diseases classified elsewhere, multiple sites: Secondary | ICD-10-CM | POA: Diagnosis not present

## 2023-08-28 DIAGNOSIS — I272 Pulmonary hypertension, unspecified: Secondary | ICD-10-CM | POA: Diagnosis not present

## 2023-08-28 DIAGNOSIS — R2689 Other abnormalities of gait and mobility: Secondary | ICD-10-CM | POA: Diagnosis not present

## 2023-08-28 DIAGNOSIS — M6281 Muscle weakness (generalized): Secondary | ICD-10-CM | POA: Diagnosis not present

## 2023-08-28 DIAGNOSIS — I2782 Chronic pulmonary embolism: Secondary | ICD-10-CM | POA: Diagnosis not present

## 2023-08-28 NOTE — Progress Notes (Signed)
Provider:   Location:  Medical illustrator of Service:  SNF ([id number])  PCP: Tisovec, Adelfa Koh, MD Patient Care Team: Tisovec, Adelfa Koh, MD as PCP - General (Internal Medicine) [place], Peter M, MD as PCP - Cardiology (Cardiology) Serena Croissant, MD as Consulting Physician (Hematology and Oncology)  Extended Emergency Contact Information Primary Emergency Contact: [name] [place] Home Phone: [phone] Relation: Daughter Secondary Emergency Contact: [name] [place] Home Phone: [phone] Mobile Phone: [phone] Relation: Son  Code Status: DNR Goals of Care: Advanced Directive information    08/24/2023    2:54 PM  Advanced Directives  Does Patient Have a Medical Advance Directive? Yes  Type of Advance Directive Out of facility DNR (pink MOST or yellow form)  Does patient want to make changes to medical advance directive? No - Patient declined  Pre-existing out of facility DNR order (yellow form or pink MOST form) Yellow form placed in chart (order not valid for inpatient use)      Chief Complaint  Patient presents with   New Admit To SNF    HPI: Patient is a 89 y.o. female seen today for admission to Rehab  Patient was admitted directly to rehab from her apartment due to worsening shortness of breath, unable to do her ADLs and weakness  Patient has a history of breast cancer s/p bilateral lumpectomy in 2014 with recurrence in 2021 in remission history of NSCLC S/P Radiation not candidate for chemo or surgery History of pulmonary hypertension with tachycardia History of chronic left pleural effusion that has been present for more than 4 years Diastolic CHF Echo Shows Severe TR and Elevated Pulmonary HTN and Normal EF CT scan Chronic Occlusive Thrombus in Left LL of Pulmonary Artery Consolidation of Left LL with Effusion  Patient had started having Worsening SOB recently with unable to do her  ADLS Has some cough due to Post nasal drip Feels weak Gets tired very easily Poor Appetite Has Swelling inher legs Her weight is up 5-6 lbs sine she saw Pulmonary in 07/24 Denies Chest Pain  .   Past Medical History:  Diagnosis Date   Allergic rhinitis    Anemia    during pregnancy   Arthritis    Breast cancer (HCC) 05/08/13   right upper inner, invasive mammary   Breast cancer, right breast (HCC) 04/16/2013   Underwent lumpectomy on 11/06/13. 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   Use of letrozole (Femara)    neoadjuvant antiestrogen therapy with letrozole 2.5 mg daily x 7 monhts   Past Surgical History:  Procedure Laterality Date   BREAST BIOPSY Left 1960   lt br bx/benign   BREAST LUMPECTOMY Right    BREAST LUMPECTOMY Right 02/10/2020   Procedure: RIGHT BREAST LUMPECTOMY;  Surgeon: Abigail Miyamoto, MD;  Location: [facility] OR;  Service: General;  Laterality: Right;   BREAST  LUMPECTOMY WITH NEEDLE LOCALIZATION Bilateral 11/06/2013   Procedure: BREAST LUMPECTOMY WITH NEEDLE LOCALIZATION;  Surgeon: Currie Paris, MD;  Location: [facility];  Service: General;  Laterality: Bilateral;   COLONOSCOPY     EYE SURGERY Bilateral    both cataracts   MOHS SURGERY Right    nose basal/squamous   TOTAL HIP ARTHROPLASTY Left 08/31/2018   Procedure: LEFT TOTAL HIP ARTHROPLASTY ANTERIOR APPROACH;  Surgeon: Kathryne Hitch, MD;  Location: [facility];  Service: Orthopedics;  Laterality: Left;   VIDEO BRONCHOSCOPY WITH ENDOBRONCHIAL NAVIGATION N/A  11/01/2017   Procedure: VIDEO BRONCHOSCOPY WITH ENDOBRONCHIAL NAVIGATION;  Surgeon: Loreli Slot, MD;  Location: [facility] OR;  Service: Thoracic;  Laterality: N/A;   WRIST SURGERY  1990   lt    reports that she quit smoking about 44 years ago. Her smoking use included cigarettes. She started smoking about 69 years ago. She has a 12.5 pack-year smoking history. She has never used smokeless tobacco. She reports current alcohol use. She reports that she does not use drugs. Social History   Socioeconomic History   Marital status: Divorced    Spouse name: Not on file   Number of children: 2   Years of education: Not on file   Highest education level: Not on file  Occupational History   Occupation: Retired  Tobacco Use   Smoking status: Former    Current packs/day: 0.00    Average packs/day: 0.5 packs/day for 25.0 years (12.5 ttl pk-yrs)    Types: Cigarettes    Start date: 12/19/1953    Quit date: 12/19/1978    Years since quitting: 44.7   Smokeless tobacco: Never  Vaping Use   Vaping status: Never Used  Substance and Sexual Activity   Alcohol use: Yes    Comment: "wine usually, no taste in the last in past few weeks- (02/07/2020)   Drug use: No   Sexual activity: Not Currently    Comment: menarche at age14, menopause in 72, HRT x 59, age at first live birth 68's, G21 P3  Other Topics Concern   Not on file  Social History Narrative   Not on file   Social Determinants of Health   Financial Resource Strain: Not on file  Food Insecurity: Not on file  Transportation Needs: Not on file  Physical Activity: Not on file  Stress: Not on file  Social Connections: Not on file  Intimate Partner Violence: Not At Risk (10/04/2018)   Humiliation, Afraid, Rape, and Kick questionnaire    Fear of Current or Ex-Partner: No    Emotionally Abused: No    Physically Abused: No    Sexually Abused: No    Functional Status Survey:    Family History  Problem Relation Age of Onset   Heart  disease Mother    Prostate cancer Father    Lung cancer Sister     Health Maintenance  Topic Date Due   Zoster Vaccines- Shingrix (1 of 2) 11/23/1945   COVID-19 Vaccine (3 - Moderna risk series) 06/18/2021   INFLUENZA VACCINE  07/20/2023   Medicare Annual Wellness (AWV)  05/24/2024   DTaP/Tdap/Td (3 - Tdap) 04/11/2028   Pneumonia Vaccine 59+ Years old  Completed   DEXA SCAN  Completed   HPV VACCINES  Aged Out   Colonoscopy  Discontinued    Allergies  Allergen Reactions   Other Anaphylaxis    Lobster- "years ago"   Codeine Itching    Insomnia    Hydrocodone Itching and  Other (See Comments)    Insomnia   Neosporin [Neomycin-Bacitracin Zn-Polymyx] Rash    Outpatient Encounter Medications as of 08/28/2023  Medication Sig   aspirin 81 MG tablet Take 81 mg by mouth daily.   B Complex-C-Folic Acid (SUPER B COMPLEX/FA/VIT C) TABS Take by mouth.   cetirizine (ZYRTEC) 10 MG tablet Take 10 mg by mouth daily.   Cholecalciferol (VITAMIN D-3) 1000 UNITS CAPS Take 2,000 Units by mouth daily.   doxylamine, Sleep, (UNISOM) 25 MG tablet Take 25 mg by mouth at bedtime.   famotidine (PEPCID) 20 MG tablet Take 1 tablet (20 mg total) by mouth daily.   fluticasone (FLONASE) 50 MCG/ACT nasal spray Place 2 sprays into both nostrils daily.   furosemide (LASIX) 20 MG tablet Take 20 mg by mouth daily. And daily prn edema   furosemide (LASIX) 40 MG tablet Take 1 tablet (40 mg total) by mouth daily for 3 days.   metoprolol tartrate (LOPRESSOR) 25 MG tablet Take 12.5 mg by mouth at bedtime.   metoprolol tartrate (LOPRESSOR) 25 MG tablet Take 1 tablet (25 mg total) by mouth in the morning.   nitroGLYCERIN (NITROSTAT) 0.4 MG SL tablet Place 0.4 mg under the tongue every 5 (five) minutes as needed for chest pain.   OXYGEN Inhale 3 L into the lungs as directed. Every Shift; Shift 1, Shift 2, Shift 3   pravastatin (PRAVACHOL) 40 MG tablet Take 40 mg daily by mouth.    No facility-administered encounter  medications on file as of 08/28/2023.    Review of Systems  Constitutional:  Positive for activity change and appetite change.  HENT:  Positive for rhinorrhea.   Respiratory:  Positive for shortness of breath. Negative for cough.   Cardiovascular:  Negative for leg swelling.  Gastrointestinal:  Negative for constipation.  Genitourinary: Negative.   Musculoskeletal:  Negative for arthralgias, gait problem and myalgias.  Skin: Negative.   Neurological:  Positive for weakness. Negative for dizziness.  Psychiatric/Behavioral:  Negative for confusion, dysphoric mood and sleep disturbance.     There were no vitals filed for this visit. There is no height or weight on file to calculate BMI. Physical Exam Vitals reviewed.  Constitutional:      Appearance: Normal appearance.  HENT:     Head: Normocephalic.     Nose: Nose normal.     Mouth/Throat:     Mouth: Mucous membranes are moist.     Pharynx: Oropharynx is clear.  Eyes:     Pupils: Pupils are equal, round, and reactive to light.  Cardiovascular:     Rate and Rhythm: Normal rate and regular rhythm.     Pulses: Normal pulses.     Heart sounds: Murmur heard.  Pulmonary:     Effort: Pulmonary effort is normal.     Breath sounds: No rales.     Comments: Decreased Breadth Sounds Bilateral Abdominal:     General: Abdomen is flat. Bowel sounds are normal.     Palpations: Abdomen is soft.  Musculoskeletal:        General: Swelling present.     Cervical back: Neck supple.  Skin:    General: Skin is warm.  Neurological:     General: No focal deficit present.     Mental Status: She is alert and oriented to person, place, and time.  Psychiatric:        Mood and Affect: Mood normal.        Thought Content: Thought content normal.  Labs reviewed: Basic Metabolic Panel: Recent Labs    12/22/22 1558 01/27/23 1044 08/11/23 1307  NA 129* 133* 143  K 4.6 4.3 4.1  CL 89* 90* 94*  CO2 24 30* 37*  GLUCOSE 98 109* 99  BUN 10  10 17   CREATININE 0.70 0.72 0.70  CALCIUM 9.3 9.5 9.6   Liver Function Tests: No results for input(s): "AST", "ALT", "ALKPHOS", "BILITOT", "PROT", "ALBUMIN" in the last 8760 hours. No results for input(s): "LIPASE", "AMYLASE" in the last 8760 hours. No results for input(s): "AMMONIA" in the last 8760 hours. CBC: Recent Labs    12/22/22 1558  WBC 6.6  NEUTROABS 4.7  HGB 13.1  HCT 39.6  MCV 87  PLT 238   Cardiac Enzymes: No results for input(s): "CKTOTAL", "CKMB", "CKMBINDEX", "TROPONINI" in the last 8760 hours. BNP: Invalid input(s): "POCBNP" Lab Results  Component Value Date   HGBA1C 5.6 12/25/2017   Lab Results  Component Value Date   TSH 1.890 05/28/2020   No results found for: "VITAMINB12" No results found for: "FOLATE" No results found for: "IRON", "TIBC", "FERRITIN"  Imaging and Procedures obtained prior to SNF admission: No results found.  Assessment/Plan 1. SOB (shortness of breath) This was d/w pulmonary Suggested to repeat CTA to follow the Thrombus and her Pleural Effusion - CT Angio Chest Pulmonary Embolism (PE) W or WO Contrast; Future  2. Severe pulmonary hypertension (HCC)  - CT Angio Chest Pulmonary Embolism (PE) W or WO Contrast; Future  3. Chronic diastolic congestive heart failure (HCC) BNP is 723 Discontinue Lasix Will start her on Torsemide Repeat BMP in 5 days - CT Angio Chest Pulmonary Embolism (PE) W or WO Contrast; Future  4. Non-small cell cancer of right lung (HCC) Has been stable Repeat CT   5. Chronic pleural effusion Follow with CT  6. Chronic rhinitis onn Flonase 7 Tachycardia Chronic Controlled on Metoprolol 8 D/W patient if she continues to get worse she will be send to the ED Patient aware of her Poor Prognosis 9 CAD Previous work up has been negative On Metoprolol 10 H/o Breast Cancer in Remission Follows with Oncology   Family/ staff Communication:   Labs/tests ordered: BMP

## 2023-08-29 ENCOUNTER — Ambulatory Visit (HOSPITAL_BASED_OUTPATIENT_CLINIC_OR_DEPARTMENT_OTHER)
Admission: RE | Admit: 2023-08-29 | Discharge: 2023-08-29 | Disposition: A | Discharge status: Home / Self Care | Payer: Medicare Other | Source: Ambulatory Visit | Attending: Internal Medicine | Admitting: Internal Medicine

## 2023-08-29 ENCOUNTER — Telehealth: Payer: Self-pay | Admitting: Internal Medicine

## 2023-08-29 DIAGNOSIS — J9811 Atelectasis: Secondary | ICD-10-CM | POA: Diagnosis not present

## 2023-08-29 DIAGNOSIS — J929 Pleural plaque without asbestos: Secondary | ICD-10-CM | POA: Diagnosis not present

## 2023-08-29 DIAGNOSIS — I272 Pulmonary hypertension, unspecified: Secondary | ICD-10-CM | POA: Diagnosis not present

## 2023-08-29 DIAGNOSIS — I5032 Chronic diastolic (congestive) heart failure: Secondary | ICD-10-CM | POA: Insufficient documentation

## 2023-08-29 DIAGNOSIS — J9601 Acute respiratory failure with hypoxia: Secondary | ICD-10-CM | POA: Diagnosis not present

## 2023-08-29 DIAGNOSIS — C3491 Malignant neoplasm of unspecified part of right bronchus or lung: Secondary | ICD-10-CM | POA: Diagnosis not present

## 2023-08-29 DIAGNOSIS — R2689 Other abnormalities of gait and mobility: Secondary | ICD-10-CM | POA: Diagnosis not present

## 2023-08-29 DIAGNOSIS — M6389 Disorders of muscle in diseases classified elsewhere, multiple sites: Secondary | ICD-10-CM | POA: Diagnosis not present

## 2023-08-29 DIAGNOSIS — R0602 Shortness of breath: Secondary | ICD-10-CM | POA: Insufficient documentation

## 2023-08-29 DIAGNOSIS — M6281 Muscle weakness (generalized): Secondary | ICD-10-CM | POA: Diagnosis not present

## 2023-08-29 DIAGNOSIS — J90 Pleural effusion, not elsewhere classified: Secondary | ICD-10-CM | POA: Diagnosis not present

## 2023-08-29 DIAGNOSIS — R278 Other lack of coordination: Secondary | ICD-10-CM | POA: Diagnosis not present

## 2023-08-29 MED ORDER — IOHEXOL 350 MG/ML SOLN
100.0000 mL | Freq: Once | INTRAVENOUS | Status: AC | PRN
  Administered 2023-08-29: 75 mL via INTRAVENOUS

## 2023-08-29 NOTE — Telephone Encounter (Signed)
D/W the patient and her family Son and Daughter  Patient has new worsening of Right Pleural Effusion. Pulmonary Dr Everardo All has kindly agreed to do Thoracocentesis tomorrow. Both Diagnostic and Therapeutic. She is already on Torsemide for now.

## 2023-08-30 ENCOUNTER — Telehealth (HOSPITAL_COMMUNITY): Payer: Self-pay | Admitting: Cardiology

## 2023-08-30 ENCOUNTER — Ambulatory Visit (HOSPITAL_COMMUNITY)
Admission: RE | Admit: 2023-08-30 | Discharge: 2023-08-30 | Disposition: A | Discharge status: Skilled Nursing Facility | Payer: Medicare Other | Attending: Pulmonary Disease | Admitting: Pulmonary Disease

## 2023-08-30 ENCOUNTER — Ambulatory Visit (HOSPITAL_COMMUNITY): Payer: Medicare Other

## 2023-08-30 ENCOUNTER — Encounter (HOSPITAL_COMMUNITY)
Admission: RE | Disposition: A | Discharge status: Skilled Nursing Facility | Payer: Self-pay | Source: Home / Self Care | Attending: Pulmonary Disease

## 2023-08-30 ENCOUNTER — Encounter (HOSPITAL_COMMUNITY): Payer: Self-pay | Admitting: Pulmonary Disease

## 2023-08-30 ENCOUNTER — Other Ambulatory Visit: Payer: Self-pay

## 2023-08-30 DIAGNOSIS — Z85118 Personal history of other malignant neoplasm of bronchus and lung: Secondary | ICD-10-CM | POA: Insufficient documentation

## 2023-08-30 DIAGNOSIS — J984 Other disorders of lung: Secondary | ICD-10-CM | POA: Diagnosis not present

## 2023-08-30 DIAGNOSIS — J948 Other specified pleural conditions: Secondary | ICD-10-CM | POA: Diagnosis not present

## 2023-08-30 DIAGNOSIS — E785 Hyperlipidemia, unspecified: Secondary | ICD-10-CM | POA: Insufficient documentation

## 2023-08-30 DIAGNOSIS — R846 Abnormal cytological findings in specimens from respiratory organs and thorax: Secondary | ICD-10-CM | POA: Diagnosis not present

## 2023-08-30 DIAGNOSIS — J90 Pleural effusion, not elsewhere classified: Secondary | ICD-10-CM | POA: Insufficient documentation

## 2023-08-30 DIAGNOSIS — Z9221 Personal history of antineoplastic chemotherapy: Secondary | ICD-10-CM | POA: Diagnosis not present

## 2023-08-30 DIAGNOSIS — Z923 Personal history of irradiation: Secondary | ICD-10-CM | POA: Diagnosis not present

## 2023-08-30 DIAGNOSIS — Z87891 Personal history of nicotine dependence: Secondary | ICD-10-CM | POA: Insufficient documentation

## 2023-08-30 DIAGNOSIS — I10 Essential (primary) hypertension: Secondary | ICD-10-CM | POA: Diagnosis not present

## 2023-08-30 DIAGNOSIS — Z48813 Encounter for surgical aftercare following surgery on the respiratory system: Secondary | ICD-10-CM | POA: Diagnosis not present

## 2023-08-30 HISTORY — PX: THORACENTESIS: SHX235

## 2023-08-30 LAB — PROTEIN, TOTAL: Total Protein: 6.6 g/dL (ref 6.5–8.1)

## 2023-08-30 LAB — BODY FLUID CELL COUNT WITH DIFFERENTIAL
Eos, Fluid: 0 %
Lymphs, Fluid: 72 %
Monocyte-Macrophage-Serous Fluid: 1 % — ABNORMAL LOW (ref 50–90)
Neutrophil Count, Fluid: 27 % — ABNORMAL HIGH (ref 0–25)
Total Nucleated Cell Count, Fluid: 324 uL (ref 0–1000)

## 2023-08-30 LAB — GLUCOSE, PLEURAL OR PERITONEAL FLUID: Glucose, Fluid: 120 mg/dL

## 2023-08-30 LAB — LACTATE DEHYDROGENASE, PLEURAL OR PERITONEAL FLUID: LD, Fluid: 42 U/L — ABNORMAL HIGH (ref 3–23)

## 2023-08-30 LAB — LACTATE DEHYDROGENASE: LDH: 142 U/L (ref 98–192)

## 2023-08-30 LAB — PROTEIN, PLEURAL OR PERITONEAL FLUID: Total protein, fluid: <3 g/dL

## 2023-08-30 SURGERY — THORACENTESIS
Anesthesia: LOCAL

## 2023-08-30 NOTE — Discharge Instructions (Signed)
If you have symptoms including shortness of breath, chest pain, dizziness or passing out, please present to the ED for evaluation

## 2023-08-30 NOTE — Progress Notes (Signed)
PCCM Progress Note  CXR with no discernible pneumothorax on my read and similar to prior CXR with chronic volume loss on left.  Right ultrasound with lung sliding at apex  Patient O2 requirement improved from 3L to 1L with SpO2 98%. Patient reports improved dyspnea and taking deeper breaths. Walked to wheelchair.  Ok for discharge

## 2023-08-30 NOTE — Telephone Encounter (Signed)
[name] French called to check the status of referral as patient has not received appt details.  NP-Pulm HTn appt made next available provider  9/25 @1120    Appt details given to Dru with [facility]

## 2023-08-30 NOTE — Op Note (Signed)
Thoracentesis  Procedure Note  [name] French  [id number]  Nov 19, 1926  Date:08/30/23  Time:3:26 PM   Provider Performing:Aris Moman Mechele Collin, MD  Procedure: Thoracentesis with imaging guidance (62130)  Indication(s) Pleural Effusion  Consent Risks of the procedure as well as the alternatives and risks of each were explained to the patient and/or caregiver.  Consent for the procedure was obtained and is signed in the bedside chart  Anesthesia Topical only with 1% lidocaine , 8 cc   Time Out Verified patient identification, verified procedure, site/side was marked, verified correct patient position, special equipment/implants available, medications/allergies/relevant history reviewed, required imaging and test results available.   Sterile Technique Maximal sterile technique including full sterile barrier drape, hand hygiene, sterile gown, sterile gloves, mask, hair covering, sterile ultrasound probe cover (if used).  Procedure Description Ultrasound was used to identify appropriate pleural anatomy for placement and overlying skin marked.  Area of drainage cleaned and draped in sterile fashion. Lidocaine was used to anesthetize the skin and subcutaneous tissue.  1100 cc's of blood tinged yellow appearing fluid was drained from the right pleural space. Catheter then removed and bandaid applied to site.   Complications/Tolerance None; patient tolerated the procedure well. Chest X-ray is ordered to confirm no post-procedural complication.   EBL Minimal   Specimen(s) Pleural fluid

## 2023-08-30 NOTE — H&P (Signed)
[name] French is an 89 y.o. female.   Chief Complaint: Right pleural effusion HPI: Ms. [name] French is a 89 year old female ormer smoker with right breast cancer with relapse 2021 s/p lumpectomy and chemotherapy, right NSCLC s/p radiation in 2018, chronic left pleural effusion. HTN, HLD who presents with new right pleural effusion.  Past Medical History:  Diagnosis Date   Allergic rhinitis    Anemia    during pregnancy   Arthritis    Breast cancer (HCC) 05/08/13   right upper inner, invasive mammary   Breast cancer, right breast (HCC) 04/16/2013   Underwent lumpectomy on 11/06/13. 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   Use of letrozole (Femara)    neoadjuvant antiestrogen therapy with letrozole 2.5 mg daily x 7 monhts    Past Surgical History:  Procedure Laterality Date   BREAST BIOPSY Left 1960   lt br bx/benign   BREAST LUMPECTOMY Right    BREAST LUMPECTOMY Right 02/10/2020   Procedure: RIGHT BREAST LUMPECTOMY;  Surgeon: Abigail Miyamoto, MD;  Location: [facility] OR;  Service: General;  Laterality: Right;   BREAST LUMPECTOMY WITH NEEDLE LOCALIZATION Bilateral 11/06/2013   Procedure: BREAST LUMPECTOMY WITH NEEDLE LOCALIZATION;  Surgeon: Currie Paris, MD;  Location: [facility];  Service: General;  Laterality: Bilateral;   COLONOSCOPY     EYE SURGERY Bilateral    both cataracts   MOHS SURGERY Right    nose basal/squamous   TOTAL HIP ARTHROPLASTY Left 08/31/2018   Procedure: LEFT TOTAL HIP ARTHROPLASTY ANTERIOR APPROACH;  Surgeon:  Kathryne Hitch, MD;  Location: [facility];  Service: Orthopedics;  Laterality: Left;   VIDEO BRONCHOSCOPY WITH ENDOBRONCHIAL NAVIGATION N/A 11/01/2017   Procedure: VIDEO BRONCHOSCOPY WITH ENDOBRONCHIAL NAVIGATION;  Surgeon: Loreli Slot, MD;  Location: [facility] OR;  Service: Thoracic;  Laterality: N/A;   WRIST SURGERY  1990   lt    Family History  Problem Relation Age of Onset   Heart disease Mother    Prostate cancer Father    Lung cancer Sister    Social History:  reports that she quit smoking about 44 years ago. Her smoking use included cigarettes. She started smoking about 69 years ago. She has a 12.5 pack-year smoking history. She has never used smokeless tobacco. She reports current alcohol use. She reports that she does not use drugs.  Allergies:  Allergies  Allergen Reactions   Other Anaphylaxis    Lobster- "years ago"   Codeine Itching    Insomnia    Hydrocodone Itching and Other (See Comments)    Insomnia   Neosporin [Neomycin-Bacitracin Zn-Polymyx] Rash    Medications Prior to Admission  Medication Sig Dispense Refill   aspirin 81 MG tablet Take 81 mg by mouth daily.     B Complex-C-Folic Acid (SUPER B COMPLEX/FA/VIT C) TABS Take by mouth.     cetirizine (ZYRTEC) 10 MG tablet  Take 10 mg by mouth daily.     Cholecalciferol (VITAMIN D-3) 1000 UNITS CAPS Take 2,000 Units by mouth daily.     doxylamine, Sleep, (UNISOM) 25 MG tablet Take 25 mg by mouth at bedtime.     famotidine (PEPCID) 20 MG tablet Take 1 tablet (20 mg total) by mouth daily.     fluticasone (FLONASE) 50 MCG/ACT nasal spray Place 2 sprays into both nostrils daily. 16 g 6   metoprolol tartrate (LOPRESSOR) 25 MG tablet Take 12.5 mg by mouth at bedtime.     metoprolol tartrate (LOPRESSOR) 25 MG tablet Take 1 tablet (25 mg total) by mouth in the morning.     OXYGEN Inhale 3 L into the lungs as directed. Every Shift; Shift 1, Shift 2, Shift 3     pravastatin (PRAVACHOL) 40 MG tablet Take 40 mg daily  by mouth.      Torsemide 40 MG TABS Take 40 mg by mouth daily.     nitroGLYCERIN (NITROSTAT) 0.4 MG SL tablet Place 0.4 mg under the tongue every 5 (five) minutes as needed for chest pain.      No results found for this or any previous visit (from the past 48 hour(s)). CT Angio Chest Pulmonary Embolism (PE) W or WO Contrast  Result Date: 08/29/2023 CLINICAL DATA:  Worsening shortness of breath. History of non-small cell lung cancer. Chronic left lower lobe pulmonary embolism, possibly related to radiation change. EXAM: CT ANGIOGRAPHY CHEST WITH CONTRAST TECHNIQUE: Multidetector CT imaging of the chest was performed using the standard protocol during bolus administration of intravenous contrast. Multiplanar CT image reconstructions and MIPs were obtained to evaluate the vascular anatomy. RADIATION DOSE REDUCTION: This exam was performed according to the departmental dose-optimization program which includes automated exposure control, adjustment of the mA and/or kV according to patient size and/or use of iterative reconstruction technique. CONTRAST:  75mL OMNIPAQUE IOHEXOL 350 MG/ML SOLN COMPARISON:  CT chest dated 02/03/2023. FINDINGS: Cardiovascular: Satisfactory opacification of the bilateral pulmonary arteries to the segmental level. 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 No new/acute pulmonary embolism. Although not tailored for evaluation of the thoracic aorta, there is no evidence thoracic aortic aneurysm or dissection. Atherosclerotic calcifications of the aortic arch. Heart is normal in size.  No pericardial effusion. Mild coronary atherosclerosis of the LAD. Mediastinum/Nodes: Small mediastinal nodes, including an 11 mm short axis low right paratracheal node (series 4/image 62), grossly unchanged. Visualized thyroid is unremarkable. Lungs/Pleura: Evaluation of the lung parenchyma is constrained by respiratory motion. Within that constraint, there are  an no new/suspicious pulmonary nodules. Moderate right pleural effusion, new. Associated compressive atelectasis in the right lower lobe. Complete left lower lobe collapse, likely reflecting a combination of scarring and post radiation change. Small to moderate chronic left pleural effusion with pleural thickening, unchanged. Small ground-glass opacity/mosaic attenuation in the right upper lobe, nonspecific. No convincing interlobular septal thickening to suggest interstitial edema. Mild centrilobular and paraseptal emphysematous changes, upper lung predominant. No pneumothorax. Upper Abdomen: Visualized upper abdomen is grossly unremarkable. Musculoskeletal: Visualized osseous structures are within normal limits. Review of the MIP images confirms the above findings. 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 No new/acute pulmonary embolism. Moderate right pleural effusion, new. Associated compressive atelectasis in the right lower lobe. Complete left lower lobe collapse, likely reflecting a combination of scarring and post radiation change. Small to moderate chronic left pleural effusion with pleural thickening, unchanged. Small mediastinal nodes, unchanged. Aortic  Atherosclerosis (ICD10-I70.0) and Emphysema (ICD10-J43.9). Electronically Signed   By: Charline Bills M.D.   On: 08/29/2023 11:28    Review of Systems  Constitutional:  Negative for chills, diaphoresis and fever.  HENT:  Negative for congestion.   Respiratory:  Positive for shortness of breath. Negative for cough and wheezing.   Cardiovascular:  Negative for chest pain, palpitations and leg swelling.    Blood pressure 127/77, pulse 97, temperature 97.7 F (36.5 C), temperature source Temporal, resp. rate (!) 24, height 5\' 5"  (1.651 m), weight 60.3 kg, SpO2 100%. Physical Exam: General: Elderly-appearing, no acute distress HENT: , AT, OP clear, MMM Eyes: EOMI, no scleral icterus Respiratory:  Diminished bases bilaterally.  No crackles, wheezing or rales Cardiovascular: RRR, -M/R/G, no JVD Extremities:-Edema,-tenderness Neuro: AAO x4, CNII-XII grossly intact Psych: Normal mood, normal affect   Assessment/Plan Right pleural effusion  Scheduled for thoracentesis on 08/30/23  Larken Urias Mechele Collin, MD 08/30/2023, 2:23 PM

## 2023-08-31 DIAGNOSIS — J9601 Acute respiratory failure with hypoxia: Secondary | ICD-10-CM | POA: Diagnosis not present

## 2023-08-31 DIAGNOSIS — M6389 Disorders of muscle in diseases classified elsewhere, multiple sites: Secondary | ICD-10-CM | POA: Diagnosis not present

## 2023-08-31 DIAGNOSIS — M6281 Muscle weakness (generalized): Secondary | ICD-10-CM | POA: Diagnosis not present

## 2023-08-31 DIAGNOSIS — R2689 Other abnormalities of gait and mobility: Secondary | ICD-10-CM | POA: Diagnosis not present

## 2023-08-31 DIAGNOSIS — R278 Other lack of coordination: Secondary | ICD-10-CM | POA: Diagnosis not present

## 2023-08-31 LAB — BASIC METABOLIC PANEL
BUN: 22 — AB (ref 4–21)
CO2: 45 — AB (ref 13–22)
Chloride: 88 — AB (ref 99–108)
Creatinine: 0.8 (ref 0.5–1.1)
Glucose: 94
Potassium: 3.9 meq/L (ref 3.5–5.1)
Sodium: 141 (ref 137–147)

## 2023-08-31 LAB — COMPREHENSIVE METABOLIC PANEL
Calcium: 9.3 (ref 8.7–10.7)
eGFR: 67

## 2023-09-01 ENCOUNTER — Non-Acute Institutional Stay (SKILLED_NURSING_FACILITY): Payer: Medicare Other | Admitting: Adult Health

## 2023-09-01 ENCOUNTER — Encounter: Payer: Self-pay | Admitting: Adult Health

## 2023-09-01 DIAGNOSIS — J90 Pleural effusion, not elsewhere classified: Secondary | ICD-10-CM | POA: Diagnosis not present

## 2023-09-01 DIAGNOSIS — J9611 Chronic respiratory failure with hypoxia: Secondary | ICD-10-CM

## 2023-09-01 DIAGNOSIS — M6281 Muscle weakness (generalized): Secondary | ICD-10-CM | POA: Diagnosis not present

## 2023-09-01 DIAGNOSIS — J9601 Acute respiratory failure with hypoxia: Secondary | ICD-10-CM | POA: Diagnosis not present

## 2023-09-01 DIAGNOSIS — M6389 Disorders of muscle in diseases classified elsewhere, multiple sites: Secondary | ICD-10-CM | POA: Diagnosis not present

## 2023-09-01 DIAGNOSIS — F411 Generalized anxiety disorder: Secondary | ICD-10-CM

## 2023-09-01 DIAGNOSIS — I272 Pulmonary hypertension, unspecified: Secondary | ICD-10-CM

## 2023-09-01 DIAGNOSIS — I5032 Chronic diastolic (congestive) heart failure: Secondary | ICD-10-CM | POA: Diagnosis not present

## 2023-09-01 DIAGNOSIS — R2689 Other abnormalities of gait and mobility: Secondary | ICD-10-CM | POA: Diagnosis not present

## 2023-09-01 DIAGNOSIS — R278 Other lack of coordination: Secondary | ICD-10-CM | POA: Diagnosis not present

## 2023-09-01 LAB — CYTOLOGY - NON PAP

## 2023-09-01 MED ORDER — LEVALBUTEROL HCL 0.63 MG/3ML IN NEBU: 0.6300 mg | INHALATION_SOLUTION | Freq: Four times a day (QID) | RESPIRATORY_TRACT | Status: DC | PRN

## 2023-09-01 MED ORDER — ESCITALOPRAM OXALATE 5 MG PO TABS
5.0000 mg | ORAL_TABLET | Freq: Every day | ORAL | Status: DC
Start: 2023-09-01 — End: 2024-02-12

## 2023-09-01 NOTE — Progress Notes (Signed)
Location:  [facility] illustrator of Service:  SNF (31) Provider:  Fletcher Anon, NP   Patient Care Team: Tisovec, Adelfa Koh, MD as PCP - General (Internal Medicine) [place], Peter M, MD as PCP - Cardiology (Cardiology) Serena Croissant, MD as Consulting Physician (Hematology and Oncology)  Extended Emergency Contact Information Primary Emergency Contact: [name] [place] Home Phone: [phone] Relation: Daughter Secondary Emergency Contact: [name] [place] Home Phone: [phone] Mobile Phone: [phone] Relation: Son  Code Status:  DNR Goals of care: Advanced Directive information    08/30/2023    1:48 PM  Advanced Directives  Type of Advance Directive Living will;Healthcare Power of Attorney  Copy of Healthcare Power of Attorney in Chart? No - copy requested     Chief Complaint  Patient presents with   Anxiety    HPI:  Pt is a 89 y.o. female seen today for an acute visit for anxiety.   She moved to skilled rehab from independently living due to fatigue, weakness, and sob on 08/24/23. BNP returned at 723, prior reading 538.7. Tried increased lasix dosing but it did not help. Now she is on torsemide and has lost weight 5-6 lbs with improved. Edema improved. SOB on exertion now but not at rest, mild improvement per patient.   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 No new/acute pulmonary embolism.   Moderate right pleural effusion, new. Associated compressive atelectasis in the right lower lobe.   Complete left lower lobe collapse, likely reflecting a combination of scarring and post radiation change. Small to moderate chronic left pleural effusion with pleural thickening, unchanged.   Has thoracentesis on the left by Dr Everardo All 08/30/23 1100 cc removed.  Serum LDH 142, pleural LDH 42 , pleural protein <3.0, serum 6.6 Culture neg so far,  cytology pending.   Ms. Bangs reports anxiety about her health and the oxygen falling off at night. The oxygen was on the floor last night and her sats dropped. She was very anxiety and sob. With deep breathing and re application of oxygen she improved.   She had a BMP 08/31/23 C02 45, up from 31. Electrolytes WNL Also she is not sleeping well.  She has had nasal drainage and sputum production. On zyrtec. Flonase started last week which she says is helping some.    She saw cardiology on 08/22/23 and they recommended that she be referred to the advanced CHF clinic. She is experiencing severe pulmonary htn, along with tricuspid regurgitation per echo 08/01/2023.   Other issues are a hx of breast ca on the right s/p bilateral lumpectomy in 2014 with recurrence in 2021 on the right s/p another right lumpectomy, anit estrogen therapy and chemo. In 2018 she was found to have NSCLC on the right and received radiation     Past Medical History:  Diagnosis Date   Allergic rhinitis    Anemia    during pregnancy   Arthritis    Breast cancer (HCC) 05/08/13   right upper inner, invasive mammary   Breast cancer, right breast (HCC) 04/16/2013   Underwent lumpectomy on 11/06/13. 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   Use of letrozole (Femara)    neoadjuvant antiestrogen therapy with letrozole 2.5 mg daily x 7 monhts   Past Surgical History:  Procedure Laterality Date   BREAST BIOPSY Left 1960   lt br bx/benign   BREAST LUMPECTOMY Right    BREAST LUMPECTOMY Right 02/10/2020    Procedure: RIGHT BREAST LUMPECTOMY;  Surgeon: Abigail Miyamoto, MD;  Location: [facility];  Service: General;  Laterality: Right;   BREAST LUMPECTOMY WITH NEEDLE LOCALIZATION Bilateral 11/06/2013   Procedure: BREAST LUMPECTOMY WITH NEEDLE LOCALIZATION;  Surgeon: Currie Paris, MD;  Location: [facility];  Service: General;  Laterality: Bilateral;   COLONOSCOPY     EYE SURGERY Bilateral    both cataracts   MOHS SURGERY Right    nose basal/squamous   TOTAL HIP ARTHROPLASTY Left 08/31/2018   Procedure: LEFT TOTAL HIP ARTHROPLASTY ANTERIOR APPROACH;  Surgeon: Kathryne Hitch, MD;  Location: [facility];  Service: Orthopedics;  Laterality: Left;   VIDEO BRONCHOSCOPY WITH ENDOBRONCHIAL NAVIGATION N/A 11/01/2017   Procedure: VIDEO BRONCHOSCOPY WITH ENDOBRONCHIAL NAVIGATION;  Surgeon: Loreli Slot, MD;  Location: [facility];  Service: Thoracic;  Laterality: N/A;   WRIST SURGERY  1990   lt    Allergies  Allergen Reactions   Other Anaphylaxis    Lobster- "years ago"   Codeine Itching    Insomnia    Hydrocodone Itching and Other (See Comments)    Insomnia   Neosporin [Neomycin-Bacitracin Zn-Polymyx] Rash    Outpatient Encounter Medications as of 09/01/2023  Medication Sig   escitalopram (LEXAPRO) 5 MG tablet Take 1 tablet (5 mg total) by mouth daily.   aspirin 81 MG tablet Take 81 mg by mouth daily.   B Complex-C-Folic Acid (SUPER B COMPLEX/FA/VIT C) TABS Take by mouth.   cetirizine (ZYRTEC) 10 MG tablet Take 10 mg by mouth daily.   Cholecalciferol (VITAMIN D-3) 1000 UNITS CAPS Take 2,000 Units by mouth daily.   doxylamine, Sleep, (UNISOM) 25 MG tablet Take 25 mg by mouth at bedtime.   famotidine (PEPCID) 20 MG tablet Take 1 tablet (20 mg total) by mouth daily.   fluticasone (FLONASE) 50 MCG/ACT nasal spray Place 2 sprays into both nostrils daily.   metoprolol tartrate (LOPRESSOR) 25 MG tablet Take 12.5 mg by mouth at bedtime.   metoprolol tartrate (LOPRESSOR) 25 MG  tablet Take 1 tablet (25 mg total) by mouth in the morning.   nitroGLYCERIN (NITROSTAT) 0.4 MG SL tablet Place 0.4 mg under the tongue every 5 (five) minutes as needed for chest pain.   OXYGEN Inhale 3 L into the lungs as directed. Every Shift; Shift 1, Shift 2, Shift 3   pravastatin (PRAVACHOL) 40 MG tablet Take 40 mg daily by mouth.    Torsemide 40 MG TABS Take 40 mg by mouth daily.   No facility-administered encounter medications on file as of 09/01/2023.    Review of Systems  Constitutional:  Positive for activity change and fatigue. Negative for appetite change, chills, diaphoresis, fever and unexpected weight change.  HENT:  Positive for congestion.   Respiratory:  Positive for cough and shortness of breath. Negative for wheezing.   Cardiovascular:  Positive for leg swelling. Negative for  chest pain and palpitations.  Gastrointestinal:  Negative for abdominal distention, abdominal pain, constipation and diarrhea.  Genitourinary:  Negative for difficulty urinating and dysuria.  Musculoskeletal:  Positive for gait problem. Negative for arthralgias, back pain, joint swelling and myalgias.  Neurological:  Negative for dizziness, tremors, seizures, syncope, facial asymmetry, speech difficulty, weakness, light-headedness, numbness and headaches.  Psychiatric/Behavioral:  Positive for sleep disturbance. Negative for agitation, behavioral problems and confusion. The patient is nervous/anxious.     Immunization History  Administered Date(s) Administered   Fluad Quad(high Dose 65+) 09/14/2020   Influenza, High Dose Seasonal PF 10/06/2017   Influenza-Unspecified 09/18/2012, 09/27/2013, 09/24/2014, 10/01/2016   Moderna SARS-COV2 Booster Vaccination 05/21/2021   Moderna Sars-Covid-2 Vaccination 12/28/2019, 01/29/2020   Pneumococcal Conjugate-13 03/18/2014   Pneumococcal Polysaccharide-23 12/04/2008, 02/19/2013   Td 12/04/2008, 04/11/2018   Zoster, Live 12/04/2008   Pertinent  Health  Maintenance Due  Topic Date Due   INFLUENZA VACCINE  07/20/2023   DEXA SCAN  Completed   Colonoscopy  Discontinued      07/27/2021    8:00 AM 02/01/2022   11:19 AM 03/22/2022   11:00 AM 08/03/2022    4:35 PM 08/25/2023    6:56 AM  Fall Risk  Falls in the past year?     0  Was there an injury with Fall?     0  Fall Risk Category Calculator     0  (RETIRED) Patient Fall Risk Level Moderate fall risk Low fall risk Low fall risk Low fall risk   Patient at Risk for Falls Due to     History of fall(s)  Fall risk Follow up     Falls evaluation completed   Functional Status Survey:    Vitals:   09/01/23 1128  BP: (!) 95/59  Pulse: 100  Resp: 20  Temp: 97.9 F (36.6 C)  SpO2: 93%  Weight: 128 lb (58.1 kg)   Body mass index is 21.3 kg/m. Physical Exam Vitals and nursing note reviewed.  Constitutional:      General: She is not in acute distress.    Appearance: She is not diaphoretic.  HENT:     Head: Normocephalic and atraumatic.     Nose: Nose normal.     Mouth/Throat:     Mouth: Mucous membranes are moist.     Pharynx: Oropharynx is clear.  Neck:     Vascular: No JVD.  Cardiovascular:     Rate and Rhythm: Normal rate and regular rhythm.     Heart sounds: No murmur heard. Pulmonary:     Effort: Pulmonary effort is normal. No respiratory distress.     Breath sounds: No wheezing.     Comments: Decreased throughout but improved from prior exam Musculoskeletal:     Cervical back: No rigidity or tenderness.     Right lower leg: Edema (+1) present.     Left lower leg: Edema (+1) present.  Lymphadenopathy:     Cervical: No cervical adenopathy.  Skin:    General: Skin is warm and dry.     Comments: BLE edema pitting +1  Neurological:     Mental Status: She is alert and oriented to person, place, and time.     Labs reviewed: Recent Labs    12/22/22 1558 01/27/23 1044 08/11/23 1307 08/25/23 0000  NA 129* 133* 143 139  K 4.6 4.3 4.1 5.4*  CL 89* 90* 94* 94*  CO2 24  30* 37* 31*  GLUCOSE 98 109* 99  --   BUN  10 10 17 17   CREATININE 0.70 0.72 0.70 0.7  CALCIUM 9.3 9.5 9.6 9.7   Recent Labs    08/25/23 0000 08/30/23 1611  AST 32  --   ALT 14  --   ALKPHOS 61  --   PROT  --  6.6  ALBUMIN 3.7  --    Recent Labs    12/22/22 1558 08/25/23 0000  WBC 6.6 4.8  NEUTROABS 4.7  --   HGB 13.1 10.2*  HCT 39.6 31*  MCV 87  --   PLT 238 171   Lab Results  Component Value Date   TSH 2.40 08/25/2023   Lab Results  Component Value Date   HGBA1C 5.6 12/25/2017   Lab Results  Component Value Date   CHOL 158 12/26/2017   HDL 51 12/26/2017   LDLCALC 74 12/26/2017   TRIG 167 (H) 12/26/2017   CHOLHDL 3.1 12/26/2017    Significant Diagnostic Results in last 30 days:  DG CHEST PORT 1 VIEW  Result Date: 08/30/2023 CLINICAL DATA:  Follow-up thoracentesis. EXAM: PORTABLE CHEST 1 VIEW COMPARISON:  12/23/2022 radiography.  08/29/2023 CT. FINDINGS: Artifact overlies the chest. Chronic markings in the right chest are unchanged without active process. No pleural fluid is seen on the right. No pneumothorax. There is pleural density on the left with volume loss in the left lower lung. This appears overall similar to the study of January. No pneumothorax. IMPRESSION: No pneumothorax. Chronic pleural density on the left with volume loss in the left lower lung. No pleural density is appreciable on the right and presumably this is the site of the procedure, as there was a large right effusion on CT scan done yesterday. Electronically Signed   By: Paulina Fusi M.D.   On: 08/30/2023 16:39   CT Angio Chest Pulmonary Embolism (PE) W or WO Contrast  Result Date: 08/29/2023 CLINICAL DATA:  Worsening shortness of breath. History of non-small cell lung cancer. Chronic left lower lobe pulmonary embolism, possibly related to radiation change. EXAM: CT ANGIOGRAPHY CHEST WITH CONTRAST TECHNIQUE: Multidetector CT imaging of the chest was performed using the standard protocol during  bolus administration of intravenous contrast. Multiplanar CT image reconstructions and MIPs were obtained to evaluate the vascular anatomy. RADIATION DOSE REDUCTION: This exam was performed according to the departmental dose-optimization program which includes automated exposure control, adjustment of the mA and/or kV according to patient size and/or use of iterative reconstruction technique. CONTRAST:  75mL OMNIPAQUE IOHEXOL 350 MG/ML SOLN COMPARISON:  CT chest dated 02/03/2023. FINDINGS: Cardiovascular: Satisfactory opacification of the bilateral pulmonary arteries to the segmental level. 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 No new/acute pulmonary embolism. Although not tailored for evaluation of the thoracic aorta, there is no evidence thoracic aortic aneurysm or dissection. Atherosclerotic calcifications of the aortic arch. Heart is normal in size.  No pericardial effusion. Mild coronary atherosclerosis of the LAD. Mediastinum/Nodes: Small mediastinal nodes, including an 11 mm short axis low right paratracheal node (series 4/image 62), grossly unchanged. Visualized thyroid is unremarkable. Lungs/Pleura: Evaluation of the lung parenchyma is constrained by respiratory motion. Within that constraint, there are an no new/suspicious pulmonary nodules. Moderate right pleural effusion, new. Associated compressive atelectasis in the right lower lobe. Complete left lower lobe collapse, likely reflecting a combination of scarring and post radiation change. Small to moderate chronic left pleural effusion with pleural thickening, unchanged. Small ground-glass opacity/mosaic attenuation in the right upper lobe, nonspecific. No convincing interlobular septal thickening to  suggest interstitial edema. Mild centrilobular and paraseptal emphysematous changes, upper lung predominant. No pneumothorax. Upper Abdomen: Visualized upper abdomen is grossly unremarkable.  Musculoskeletal: Visualized osseous structures are within normal limits. Review of the MIP images confirms the above findings. 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 No new/acute pulmonary embolism. Moderate right pleural effusion, new. Associated compressive atelectasis in the right lower lobe. Complete left lower lobe collapse, likely reflecting a combination of scarring and post radiation change. Small to moderate chronic left pleural effusion with pleural thickening, unchanged. Small mediastinal nodes, unchanged. Aortic Atherosclerosis (ICD10-I70.0) and Emphysema (ICD10-J43.9). Electronically Signed   By: Charline Bills M.D.   On: 08/29/2023 11:28    Assessment/Plan  1. Generalized anxiety disorder Agreed to try lexapro, will monitor BMP for low sodium Also discussed that for severe panic we can try xanax but I would not recommend it unless there is a severe issues due her lung condition and c02 levels right now.  - escitalopram (LEXAPRO) 5 MG tablet; Take 1 tablet (5 mg total) by mouth daily. Apply paper tape to keep 02 on at night  2. Severe pulmonary hypertension (HCC) Now on torsemide with improved weight Mild improvement in symptoms Follow up with CHF clinic 9/25 Weight daily   3. Pleural effusion, right S/p thoracentesis Follow up scheduled with Dr Everardo All Cytology pending.   4. Pleural effusion on left Chronic   5. Chronic diastolic congestive heart failure (HCC) Continue torsemide   6. Chronic respiratory failure with hypoxia (HCC) Continue oxygen 2 liters titrate but keep sats 88-92%  Add xopenex q 6 prn for cough or wheeze.      Family/ staff Communication: discussed with pt and nurse   Labs/tests ordered:  BMP 9/16

## 2023-09-03 ENCOUNTER — Encounter (HOSPITAL_COMMUNITY): Payer: Self-pay | Admitting: Pulmonary Disease

## 2023-09-03 LAB — BODY FLUID CULTURE W GRAM STAIN
Culture: NO GROWTH
Gram Stain: NONE SEEN

## 2023-09-04 ENCOUNTER — Encounter: Payer: Self-pay | Admitting: Internal Medicine

## 2023-09-04 ENCOUNTER — Non-Acute Institutional Stay (SKILLED_NURSING_FACILITY): Payer: Self-pay | Admitting: Internal Medicine

## 2023-09-04 DIAGNOSIS — I5032 Chronic diastolic (congestive) heart failure: Secondary | ICD-10-CM | POA: Diagnosis not present

## 2023-09-04 DIAGNOSIS — R918 Other nonspecific abnormal finding of lung field: Secondary | ICD-10-CM | POA: Diagnosis not present

## 2023-09-04 DIAGNOSIS — J90 Pleural effusion, not elsewhere classified: Secondary | ICD-10-CM

## 2023-09-04 DIAGNOSIS — R2689 Other abnormalities of gait and mobility: Secondary | ICD-10-CM | POA: Diagnosis not present

## 2023-09-04 DIAGNOSIS — R278 Other lack of coordination: Secondary | ICD-10-CM | POA: Diagnosis not present

## 2023-09-04 DIAGNOSIS — J9692 Respiratory failure, unspecified with hypercapnia: Secondary | ICD-10-CM | POA: Diagnosis not present

## 2023-09-04 DIAGNOSIS — I2782 Chronic pulmonary embolism: Secondary | ICD-10-CM | POA: Diagnosis not present

## 2023-09-04 DIAGNOSIS — M6389 Disorders of muscle in diseases classified elsewhere, multiple sites: Secondary | ICD-10-CM | POA: Diagnosis not present

## 2023-09-04 DIAGNOSIS — I272 Pulmonary hypertension, unspecified: Secondary | ICD-10-CM

## 2023-09-04 DIAGNOSIS — M6281 Muscle weakness (generalized): Secondary | ICD-10-CM | POA: Diagnosis not present

## 2023-09-04 DIAGNOSIS — J9811 Atelectasis: Secondary | ICD-10-CM | POA: Diagnosis not present

## 2023-09-04 DIAGNOSIS — J9809 Other diseases of bronchus, not elsewhere classified: Secondary | ICD-10-CM | POA: Diagnosis not present

## 2023-09-04 DIAGNOSIS — J9601 Acute respiratory failure with hypoxia: Secondary | ICD-10-CM | POA: Diagnosis not present

## 2023-09-04 DIAGNOSIS — F411 Generalized anxiety disorder: Secondary | ICD-10-CM

## 2023-09-04 NOTE — Progress Notes (Unsigned)
Location:  [facility] Room Number: 151 Place of Service:  SNF ([id number]) Provider:  Delene Ruffini, MD  Patient Care Team: Gaspar Garbe, MD as PCP - General (Internal Medicine) [place], Peter M, MD as PCP - Cardiology (Cardiology) Serena Croissant, MD as Consulting Physician (Hematology and Oncology)  Extended Emergency Contact Information Primary Emergency Contact: [name] [place] Home Phone: [phone] Relation: Daughter Secondary Emergency Contact: [name] [place] Home Phone: [phone] Mobile Phone: [phone] Relation: Son  Code Status:  DNR Goals of care: Advanced Directive information    09/04/2023   10:16 AM  Advanced Directives  Does Patient Have a Medical Advance Directive? Yes  Type of Advance Directive Out of facility DNR (pink MOST or yellow form)  Does patient want to make changes to medical advance directive? No - Patient declined  Copy of Healthcare Power of Attorney in Chart? Yes - validated most recent copy scanned in chart (See row information)  Pre-existing out of facility DNR order (yellow form or pink MOST form) Yellow form placed in chart (order not valid for inpatient use)     Chief Complaint  Patient presents with  . Acute Visit    Patient is being seen for Shortness of Breath  . Immunizations    Patient is due for shingles, flu and covid vaccine     HPI:  Pt is a 89 y.o. female seen today for an acute visit for SOB     Patient was admitted directly to rehab from her apartment due to worsening shortness of breath, unable to do her ADLs and weakness   Patient has a history of breast cancer s/p bilateral lumpectomy in 2014 with recurrence in 2021 in remission history of NSCLC S/P Radiation not candidate for chemo or surgery History of pulmonary hypertension with tachycardia History of chronic left pleural effusion that has been present  for more than 4 years Diastolic CHF Echo Shows Severe TR and Elevated Pulmonary HTN and Normal EF CT scan Chronic Occlusive Thrombus in Left LL of Pulmonary Artery Consolidation of Left LL with Effusion   Has CT scan of Chest Repeated on 09/10 New Right sided Effusion Underwent Thoracocentesis on 09/11 Fluid is Transudate with no suspicious cells  She feels the same Still needing 3 l Gets hypoxic very easily by exertion Appetite is good No Chest pain or Cough or fever Still needing help with ADLS Wt Readings from Last 3 Encounters:  09/04/23 127 lb (57.6 kg)  09/01/23 128 lb (58.1 kg)  08/30/23 133 lb (60.3 kg)        Past Medical History:  Diagnosis Date  . Allergic rhinitis   . Anemia    during pregnancy  . Arthritis   . Breast cancer (HCC) 05/08/13   right upper inner, invasive mammary  . Breast cancer, right breast (HCC) 04/16/2013   Underwent lumpectomy on 11/06/13. Path showed G1 ILC, 2.7 cm, neg margins, receptor+, Her2neg   . Diverticulosis   . Dyspnea    when carry heavy packages  . Dysrhythmia    Sinus Tach- on Metoprolol  . 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 Hemorrhoids   . Hx of adenomatous colonic polyps 05/2002  . Lung cancer (HCC)   . Lung cancer (HCC) 10/2017  . Meningioma (HCC)   . Osteoporosis   . Pneumonia     x2 last time was 2019  . Rosacea   . Skin cancer   .  Tachycardia   . 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 Use of letrozole (Femara)    neoadjuvant antiestrogen therapy with letrozole 2.5 mg daily x 7 monhts   Past Surgical History:  Procedure Laterality Date  . BREAST BIOPSY Left 1960   lt br bx/benign  . BREAST LUMPECTOMY Right   . BREAST LUMPECTOMY Right 02/10/2020   Procedure: RIGHT BREAST LUMPECTOMY;  Surgeon: Abigail Miyamoto, MD;  Location: [facility] OR;  Service: General;  Laterality: Right;  . BREAST LUMPECTOMY WITH NEEDLE LOCALIZATION Bilateral 11/06/2013   Procedure: BREAST LUMPECTOMY WITH NEEDLE LOCALIZATION;  Surgeon:  Currie Paris, MD;  Location: [facility];  Service: General;  Laterality: Bilateral;  . COLONOSCOPY    . EYE SURGERY Bilateral    both cataracts  . MOHS SURGERY Right    nose basal/squamous  . THORACENTESIS N/A 08/30/2023   Procedure: THORACENTESIS;  Surgeon: Luciano Cutter, MD;  Location: [facility] ENDOSCOPY;  Service: Pulmonary;  Laterality: N/A;  . TOTAL HIP ARTHROPLASTY Left 08/31/2018   Procedure: LEFT TOTAL HIP ARTHROPLASTY ANTERIOR APPROACH;  Surgeon: Kathryne Hitch, MD;  Location: [facility] ORS;  Service: Orthopedics;  Laterality: Left;  Marland Kitchen VIDEO BRONCHOSCOPY WITH ENDOBRONCHIAL NAVIGATION N/A 11/01/2017   Procedure: VIDEO BRONCHOSCOPY WITH ENDOBRONCHIAL NAVIGATION;  Surgeon: Loreli Slot, MD;  Location: [facility] OR;  Service: Thoracic;  Laterality: N/A;  . WRIST SURGERY  1990   lt    Allergies  Allergen Reactions  . Other Anaphylaxis    Lobster- "years ago"  . Codeine Itching    Insomnia   . Hydrocodone Itching and Other (See Comments)    Insomnia  . Neosporin [Neomycin-Bacitracin Zn-Polymyx] Rash    Outpatient Encounter Medications as of 09/04/2023  Medication Sig  . aspirin 81 MG tablet Take 81 mg by mouth daily.  . B Complex-C-Folic Acid (SUPER B COMPLEX/FA/VIT C) TABS Take by mouth.  . cetirizine (ZYRTEC) 10 MG tablet Take 10 mg by mouth daily.  . Cholecalciferol (VITAMIN D-3) 1000 UNITS CAPS Take 2,000 Units by mouth daily.  Marland Kitchen doxylamine, Sleep, (UNISOM) 25 MG tablet Take 25 mg by mouth at bedtime.  Marland Kitchen escitalopram (LEXAPRO) 5 MG tablet Take 1 tablet (5 mg total) by mouth daily.  . famotidine (PEPCID) 20 MG tablet Take 1 tablet (20 mg total) by mouth daily.  . fluticasone (FLONASE) 50 MCG/ACT nasal spray Place 2 sprays into both nostrils daily.  Marland Kitchen levalbuterol (XOPENEX) 0.63 MG/3ML nebulizer solution Take 3 mLs (0.63 mg total) by nebulization every 6 (six) hours as needed for wheezing or shortness of breath.  . metoprolol tartrate (LOPRESSOR) 25 MG  tablet Take 12.5 mg by mouth at bedtime.  . metoprolol tartrate (LOPRESSOR) 25 MG tablet Take 1 tablet (25 mg total) by mouth in the morning.  . Multiple Vitamins-Minerals (PRESERVISION AREDS 2) CAPS Take 1 mg by mouth in the morning and at bedtime.  . nitroGLYCERIN (NITROSTAT) 0.4 MG SL tablet Place 0.4 mg under the tongue every 5 (five) minutes as needed for chest pain.  . OXYGEN Inhale 3 L into the lungs as directed. Every Shift; Shift 1, Shift 2, Shift 3  . pravastatin (PRAVACHOL) 40 MG tablet Take 40 mg daily by mouth.   . Probiotic Product (ALIGN) 4 MG CAPS Take 4 mg by mouth daily. 1 capsule oral once a day  . torsemide (DEMADEX) 20 MG tablet Take 20 mg by mouth daily. 1 tablet once a morning  . [DISCONTINUED] Torsemide 40 MG TABS Take 20 mg by  mouth daily.   No facility-administered encounter medications on file as of 09/04/2023.    Review of Systems  Constitutional:  Positive for activity change. Negative for appetite change.  HENT: Negative.    Respiratory:  Positive for shortness of breath. Negative for cough.   Cardiovascular:  Positive for leg swelling.  Gastrointestinal:  Negative for constipation.  Genitourinary: Negative.   Musculoskeletal:  Positive for gait problem. Negative for arthralgias and myalgias.  Skin: Negative.   Neurological:  Positive for weakness. Negative for dizziness.  Psychiatric/Behavioral:  Negative for confusion, dysphoric mood and sleep disturbance.     Immunization History  Administered Date(s) Administered  . Fluad Quad(high Dose 65+) 09/14/2020  . Influenza, High Dose Seasonal PF 10/06/2017  . Influenza-Unspecified 09/18/2012, 09/27/2013, 09/24/2014, 10/01/2016  . Moderna SARS-COV2 Booster Vaccination 05/21/2021  . Moderna Sars-Covid-2 Vaccination 12/28/2019, 01/29/2020  . Pneumococcal Conjugate-13 03/18/2014  . Pneumococcal Polysaccharide-23 12/04/2008, 02/19/2013  . Td 12/04/2008, 04/11/2018  . Zoster, Live 12/04/2008   Pertinent   Health Maintenance Due  Topic Date Due  . INFLUENZA VACCINE  07/20/2023  . DEXA SCAN  Completed  . Colonoscopy  Discontinued      07/27/2021    8:00 AM 02/01/2022   11:19 AM 03/22/2022   11:00 AM 08/03/2022    4:35 PM 08/25/2023    6:56 AM  Fall Risk  Falls in the past year?     0  Was there an injury with Fall?     0  Fall Risk Category Calculator     0  (RETIRED) Patient Fall Risk Level Moderate fall risk Low fall risk Low fall risk Low fall risk   Patient at Risk for Falls Due to     History of fall(s)  Fall risk Follow up     Falls evaluation completed   Functional Status Survey:    Vitals:   09/04/23 1008  BP: 96/65  Pulse: 92  Resp: 17  Temp: (!) 96.8 F (36 C)  TempSrc: Temporal  SpO2: 98%  Weight: 127 lb (57.6 kg)  Height: 5\' 5"  (1.651 m)   Body mass index is 21.13 kg/m. Physical Exam Vitals reviewed.  Constitutional:      Appearance: Normal appearance.  HENT:     Head: Normocephalic.     Nose: Nose normal.     Mouth/Throat:     Mouth: Mucous membranes are moist.     Pharynx: Oropharynx is clear.  Eyes:     Pupils: Pupils are equal, round, and reactive to light.  Cardiovascular:     Rate and Rhythm: Normal rate. Rhythm irregular.     Pulses: Normal pulses.     Heart sounds: Murmur heard.  Pulmonary:     Effort: Pulmonary effort is normal.     Comments: Decreased BS bilateral Abdominal:     General: Abdomen is flat. Bowel sounds are normal.     Palpations: Abdomen is soft.  Musculoskeletal:        General: Swelling present.     Cervical back: Neck supple.  Skin:    General: Skin is warm.  Neurological:     General: No focal deficit present.     Mental Status: She is alert and oriented to person, place, and time.  Psychiatric:        Mood and Affect: Mood normal.        Thought Content: Thought content normal.    Labs reviewed: Recent Labs    12/22/22 1558 01/27/23 1044 08/11/23 1307 08/25/23 0000  NA 129* 133* 143 139  K 4.6 4.3 4.1 5.4*   CL 89* 90* 94* 94*  CO2 24 30* 37* 31*  GLUCOSE 98 109* 99  --   BUN 10 10 17 17   CREATININE 0.70 0.72 0.70 0.7  CALCIUM 9.3 9.5 9.6 9.7   Recent Labs    08/25/23 0000 08/30/23 1611  AST 32  --   ALT 14  --   ALKPHOS 61  --   PROT  --  6.6  ALBUMIN 3.7  --    Recent Labs    12/22/22 1558 08/25/23 0000  WBC 6.6 4.8  NEUTROABS 4.7  --   HGB 13.1 10.2*  HCT 39.6 31*  MCV 87  --   PLT 238 171   Lab Results  Component Value Date   TSH 2.40 08/25/2023   Lab Results  Component Value Date   HGBA1C 5.6 12/25/2017   Lab Results  Component Value Date   CHOL 158 12/26/2017   HDL 51 12/26/2017   LDLCALC 74 12/26/2017   TRIG 167 (H) 12/26/2017   CHOLHDL 3.1 12/26/2017    Significant Diagnostic Results in last 30 days:  DG CHEST PORT 1 VIEW  Result Date: 08/30/2023 CLINICAL DATA:  Follow-up thoracentesis. EXAM: PORTABLE CHEST 1 VIEW COMPARISON:  12/23/2022 radiography.  08/29/2023 CT. FINDINGS: Artifact overlies the chest. Chronic markings in the right chest are unchanged without active process. No pleural fluid is seen on the right. No pneumothorax. There is pleural density on the left with volume loss in the left lower lung. This appears overall similar to the study of January. No pneumothorax. IMPRESSION: No pneumothorax. Chronic pleural density on the left with volume loss in the left lower lung. No pleural density is appreciable on the right and presumably this is the site of the procedure, as there was a large right effusion on CT scan done yesterday. Electronically Signed   By: Paulina Fusi M.D.   On: 08/30/2023 16:39   CT Angio Chest Pulmonary Embolism (PE) W or WO Contrast  Result Date: 08/29/2023 CLINICAL DATA:  Worsening shortness of breath. History of non-small cell lung cancer. Chronic left lower lobe pulmonary embolism, possibly related to radiation change. EXAM: CT ANGIOGRAPHY CHEST WITH CONTRAST TECHNIQUE: Multidetector CT imaging of the chest was performed  using the standard protocol during bolus administration of intravenous contrast. Multiplanar CT image reconstructions and MIPs were obtained to evaluate the vascular anatomy. RADIATION DOSE REDUCTION: This exam was performed according to the departmental dose-optimization program which includes automated exposure control, adjustment of the mA and/or kV according to patient size and/or use of iterative reconstruction technique. CONTRAST:  75mL OMNIPAQUE IOHEXOL 350 MG/ML SOLN COMPARISON:  CT chest dated 02/03/2023. FINDINGS: Cardiovascular: Satisfactory opacification of the bilateral pulmonary arteries to the segmental level. 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 No new/acute pulmonary embolism. Although not tailored for evaluation of the thoracic aorta, there is no evidence thoracic aortic aneurysm or dissection. Atherosclerotic calcifications of the aortic arch. Heart is normal in size.  No pericardial effusion. Mild coronary atherosclerosis of the LAD. Mediastinum/Nodes: Small mediastinal nodes, including an 11 mm short axis low right paratracheal node (series 4/image 62), grossly unchanged. Visualized thyroid is unremarkable. Lungs/Pleura: Evaluation of the lung parenchyma is constrained by respiratory motion. Within that constraint, there are an no new/suspicious pulmonary nodules. Moderate right pleural effusion, new. Associated compressive atelectasis in the right lower lobe. Complete left lower lobe collapse, likely reflecting a  combination of scarring and post radiation change. Small to moderate chronic left pleural effusion with pleural thickening, unchanged. Small ground-glass opacity/mosaic attenuation in the right upper lobe, nonspecific. No convincing interlobular septal thickening to suggest interstitial edema. Mild centrilobular and paraseptal emphysematous changes, upper lung predominant. No pneumothorax. Upper Abdomen: Visualized upper abdomen is  grossly unremarkable. Musculoskeletal: Visualized osseous structures are within normal limits. Review of the MIP images confirms the above findings. 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 No new/acute pulmonary embolism. Moderate right pleural effusion, new. Associated compressive atelectasis in the right lower lobe. Complete left lower lobe collapse, likely reflecting a combination of scarring and post radiation change. Small to moderate chronic left pleural effusion with pleural thickening, unchanged. Small mediastinal nodes, unchanged. Aortic Atherosclerosis (ICD10-I70.0) and Emphysema (ICD10-J43.9). Electronically Signed   By: Charline Bills M.D.   On: 08/29/2023 11:28    Assessment/Plan 1. Pleural effusion, right S/p Thoracocentesis Still not much improvement in her Clinically Still needing 3 litre Repeat Chest Xray   2. Severe pulmonary hypertension (HCC)   3. Pleural effusion on left ***  4. Generalized anxiety disorder Lexapro  5. Chronic diastolic congestive heart failure (HCC) Incrase her Torsemide 40 mg  K 10 meq QD 6. Other chronic pulmonary embolism, unspecified whether acute cor pulmonale present (HCC) ***    Family/ staff Communication: B Labs/tests ordered:  ***

## 2023-09-05 DIAGNOSIS — R278 Other lack of coordination: Secondary | ICD-10-CM | POA: Diagnosis not present

## 2023-09-05 DIAGNOSIS — M6389 Disorders of muscle in diseases classified elsewhere, multiple sites: Secondary | ICD-10-CM | POA: Diagnosis not present

## 2023-09-05 DIAGNOSIS — R2689 Other abnormalities of gait and mobility: Secondary | ICD-10-CM | POA: Diagnosis not present

## 2023-09-05 DIAGNOSIS — M6281 Muscle weakness (generalized): Secondary | ICD-10-CM | POA: Diagnosis not present

## 2023-09-05 DIAGNOSIS — J9601 Acute respiratory failure with hypoxia: Secondary | ICD-10-CM | POA: Diagnosis not present

## 2023-09-06 DIAGNOSIS — R2689 Other abnormalities of gait and mobility: Secondary | ICD-10-CM | POA: Diagnosis not present

## 2023-09-06 DIAGNOSIS — R278 Other lack of coordination: Secondary | ICD-10-CM | POA: Diagnosis not present

## 2023-09-06 DIAGNOSIS — J9601 Acute respiratory failure with hypoxia: Secondary | ICD-10-CM | POA: Diagnosis not present

## 2023-09-06 DIAGNOSIS — M6389 Disorders of muscle in diseases classified elsewhere, multiple sites: Secondary | ICD-10-CM | POA: Diagnosis not present

## 2023-09-06 DIAGNOSIS — M6281 Muscle weakness (generalized): Secondary | ICD-10-CM | POA: Diagnosis not present

## 2023-09-07 DIAGNOSIS — R2689 Other abnormalities of gait and mobility: Secondary | ICD-10-CM | POA: Diagnosis not present

## 2023-09-07 DIAGNOSIS — M6281 Muscle weakness (generalized): Secondary | ICD-10-CM | POA: Diagnosis not present

## 2023-09-07 DIAGNOSIS — M6389 Disorders of muscle in diseases classified elsewhere, multiple sites: Secondary | ICD-10-CM | POA: Diagnosis not present

## 2023-09-07 DIAGNOSIS — J9601 Acute respiratory failure with hypoxia: Secondary | ICD-10-CM | POA: Diagnosis not present

## 2023-09-07 DIAGNOSIS — R278 Other lack of coordination: Secondary | ICD-10-CM | POA: Diagnosis not present

## 2023-09-08 DIAGNOSIS — R278 Other lack of coordination: Secondary | ICD-10-CM | POA: Diagnosis not present

## 2023-09-08 DIAGNOSIS — J9601 Acute respiratory failure with hypoxia: Secondary | ICD-10-CM | POA: Diagnosis not present

## 2023-09-08 DIAGNOSIS — M6281 Muscle weakness (generalized): Secondary | ICD-10-CM | POA: Diagnosis not present

## 2023-09-08 DIAGNOSIS — M6389 Disorders of muscle in diseases classified elsewhere, multiple sites: Secondary | ICD-10-CM | POA: Diagnosis not present

## 2023-09-08 DIAGNOSIS — R2689 Other abnormalities of gait and mobility: Secondary | ICD-10-CM | POA: Diagnosis not present

## 2023-09-11 DIAGNOSIS — M6281 Muscle weakness (generalized): Secondary | ICD-10-CM | POA: Diagnosis not present

## 2023-09-11 DIAGNOSIS — M6389 Disorders of muscle in diseases classified elsewhere, multiple sites: Secondary | ICD-10-CM | POA: Diagnosis not present

## 2023-09-11 DIAGNOSIS — J9601 Acute respiratory failure with hypoxia: Secondary | ICD-10-CM | POA: Diagnosis not present

## 2023-09-11 DIAGNOSIS — R278 Other lack of coordination: Secondary | ICD-10-CM | POA: Diagnosis not present

## 2023-09-11 DIAGNOSIS — R0602 Shortness of breath: Secondary | ICD-10-CM | POA: Diagnosis not present

## 2023-09-11 DIAGNOSIS — R2689 Other abnormalities of gait and mobility: Secondary | ICD-10-CM | POA: Diagnosis not present

## 2023-09-11 LAB — BASIC METABOLIC PANEL
BUN: 19 (ref 4–21)
CO2: 44 — AB (ref 13–22)
Chloride: 90 — AB (ref 99–108)
Creatinine: 0.7 (ref 0.5–1.1)
Glucose: 92
Potassium: 3.9 meq/L (ref 3.5–5.1)
Sodium: 143 (ref 137–147)

## 2023-09-11 LAB — COMPREHENSIVE METABOLIC PANEL
Calcium: 9.7 (ref 8.7–10.7)
eGFR: 78

## 2023-09-12 DIAGNOSIS — M6389 Disorders of muscle in diseases classified elsewhere, multiple sites: Secondary | ICD-10-CM | POA: Diagnosis not present

## 2023-09-12 DIAGNOSIS — J9601 Acute respiratory failure with hypoxia: Secondary | ICD-10-CM | POA: Diagnosis not present

## 2023-09-12 DIAGNOSIS — R2689 Other abnormalities of gait and mobility: Secondary | ICD-10-CM | POA: Diagnosis not present

## 2023-09-12 DIAGNOSIS — R278 Other lack of coordination: Secondary | ICD-10-CM | POA: Diagnosis not present

## 2023-09-12 DIAGNOSIS — M6281 Muscle weakness (generalized): Secondary | ICD-10-CM | POA: Diagnosis not present

## 2023-09-13 ENCOUNTER — Encounter (HOSPITAL_COMMUNITY): Payer: Self-pay | Admitting: Cardiology

## 2023-09-13 ENCOUNTER — Ambulatory Visit (HOSPITAL_COMMUNITY)
Admission: RE | Admit: 2023-09-13 | Discharge: 2023-09-13 | Disposition: A | Discharge status: Home / Self Care | Payer: Medicare Other | Source: Ambulatory Visit | Attending: Cardiology | Admitting: Cardiology

## 2023-09-13 VITALS — BP 98/54 | HR 97 | Wt 120.0 lb

## 2023-09-13 DIAGNOSIS — Z9221 Personal history of antineoplastic chemotherapy: Secondary | ICD-10-CM | POA: Insufficient documentation

## 2023-09-13 DIAGNOSIS — I272 Pulmonary hypertension, unspecified: Secondary | ICD-10-CM | POA: Insufficient documentation

## 2023-09-13 DIAGNOSIS — I38 Endocarditis, valve unspecified: Secondary | ICD-10-CM | POA: Diagnosis not present

## 2023-09-13 DIAGNOSIS — E785 Hyperlipidemia, unspecified: Secondary | ICD-10-CM | POA: Insufficient documentation

## 2023-09-13 DIAGNOSIS — Z853 Personal history of malignant neoplasm of breast: Secondary | ICD-10-CM | POA: Diagnosis not present

## 2023-09-13 DIAGNOSIS — Z923 Personal history of irradiation: Secondary | ICD-10-CM | POA: Insufficient documentation

## 2023-09-13 DIAGNOSIS — R2689 Other abnormalities of gait and mobility: Secondary | ICD-10-CM | POA: Diagnosis not present

## 2023-09-13 DIAGNOSIS — I110 Hypertensive heart disease with heart failure: Secondary | ICD-10-CM | POA: Diagnosis not present

## 2023-09-13 DIAGNOSIS — R278 Other lack of coordination: Secondary | ICD-10-CM | POA: Diagnosis not present

## 2023-09-13 DIAGNOSIS — I5032 Chronic diastolic (congestive) heart failure: Secondary | ICD-10-CM | POA: Insufficient documentation

## 2023-09-13 DIAGNOSIS — M6281 Muscle weakness (generalized): Secondary | ICD-10-CM | POA: Diagnosis not present

## 2023-09-13 DIAGNOSIS — C3491 Malignant neoplasm of unspecified part of right bronchus or lung: Secondary | ICD-10-CM

## 2023-09-13 DIAGNOSIS — Z87891 Personal history of nicotine dependence: Secondary | ICD-10-CM | POA: Insufficient documentation

## 2023-09-13 DIAGNOSIS — M6389 Disorders of muscle in diseases classified elsewhere, multiple sites: Secondary | ICD-10-CM | POA: Diagnosis not present

## 2023-09-13 DIAGNOSIS — J9601 Acute respiratory failure with hypoxia: Secondary | ICD-10-CM | POA: Diagnosis not present

## 2023-09-13 MED ORDER — TORSEMIDE 20 MG PO TABS: 20.0000 mg | ORAL_TABLET | Freq: Every day | ORAL | Status: DC

## 2023-09-13 NOTE — Patient Instructions (Signed)
Following orders sent back to [facility]:  Decrease Torsemide to 20 mg Daily Follow-up with general cards as scheduled MOn 12/16 at 4 pm

## 2023-09-13 NOTE — Progress Notes (Signed)
   PULMONARY HYPERTENSION NEW PATIENT CLINIC NOTE  Referring Physician: Tisovec, Adelfa Koh, MD  Primary Care: Tisovec, Adelfa Koh, MD Primary Cardiologist:  HPI: [name] French is a 89 y.o. female with a PMH of former smoker with right breast cancer with relapse 2021 s/p lumpectomy and chemotherapy, right NSCLC s/p radiation in 2018, chronic left pleural effusion. HTN, HLD, HFpEF, pulmonary hypertension who presents for initial visit for further evaluation and treatment of pulmonary hypertension.    Patient has a known history of diastolic heart failure. She was previously followed by Dr. Elvis Coil office for her valvular disease as well as HF. She was recently placed on oxygen and had a pleural effusion drained on 9/11. She recently was placed in a nursing care facility and has been working with them to become stronger.        Patient reports that overall she has been doing better at the nursing facility.  She has been able to move with the assistance of a walker as well as therapist support.  She denies any worsening shortness of breath with activity but does get tired easily.  She reports that she stays "dry" all the time and feels that she is urinating constantly.  She denies any recent weight gain and is able to sleep well at night.  We discussed her known cardiac disease including her valvular disease.  PMH, current medications, allergies, social history, and family history reviewed in epic.  PHYSICAL EXAM: Vitals:   09/13/23 1135  BP: (!) 98/54  Pulse: 97  SpO2: 98%   GENERAL: Chronically ill-appearing, elderly HEENT: Negative for xanthelasma. There is no scleral icterus.  The mucous membranes are pink and moist.   CHEST: There are no chest wall deformities. There is no chest wall tenderness. Respirations are unlabored.  Lungs-diminished breath sounds on the left CARDIAC:  JVP: Not elevated          Systolic murmur, normal rate and rhythm. No LE edema.  ABDOMEN: Soft,  non-tender, non-distended. Marland Kitchen  EXTREMITIES: Warm and well perfused with no cyanosis, clubbing.  NEUROLOGIC: Patient is oriented x3 with no focal or lateralizing neurologic deficits.  PSYCH: Patients affect is appropriate, there is no evidence of anxiety or depression.  SKIN: Warm and dry; no lesions or wounds.    ASSESSMENT & PLAN:  Pulmonary hypertension: Likely multifactorial with known pulmonary disease including non-small cell lung cancer with radiation as well as valvular heart disease.  She does not appear overtly volume up and has improved with increased dose of diuretics, although feels dry with some mild orthostasis symptoms.  We discussed that a right heart cath is of limited utility given her likely diagnosis and limited therapeutic options besides diuretics.  She expressed understanding and just wishes to remain home and comfortable. - Decreased torsemide to 20mg  given improved volume status - Continue to follow serial echocardiogram for known valvular disease, though patient would prefer no invasive interventions at this time - Consider further reduction in metoprolol given borderline HR - Not a candidate for vasodilatory therapy and limited benefit of RHC at this time - Follow up in [facility] clinic prn  Valvular disease: Mixed picture, with moderate/severe MR, likely moderate AS with low flow, and resulting RV pressure overload. She has improved with diuresis, and is a poor candidate for valvular intervention. - Cardiology follow up   Clearnce Hasten, MD Advanced Heart Failure Mechanical Circulatory Support 09/13/23

## 2023-09-14 ENCOUNTER — Other Ambulatory Visit (HOSPITAL_BASED_OUTPATIENT_CLINIC_OR_DEPARTMENT_OTHER): Payer: Self-pay

## 2023-09-14 ENCOUNTER — Ambulatory Visit (HOSPITAL_BASED_OUTPATIENT_CLINIC_OR_DEPARTMENT_OTHER): Payer: Medicare Other | Admitting: Pulmonary Disease

## 2023-09-14 ENCOUNTER — Ambulatory Visit (HOSPITAL_BASED_OUTPATIENT_CLINIC_OR_DEPARTMENT_OTHER): Payer: Medicare Other

## 2023-09-14 VITALS — BP 102/52 | HR 94 | Ht 65.0 in | Wt 118.0 lb

## 2023-09-14 DIAGNOSIS — I700 Atherosclerosis of aorta: Secondary | ICD-10-CM | POA: Diagnosis not present

## 2023-09-14 DIAGNOSIS — J9601 Acute respiratory failure with hypoxia: Secondary | ICD-10-CM | POA: Diagnosis not present

## 2023-09-14 DIAGNOSIS — R278 Other lack of coordination: Secondary | ICD-10-CM | POA: Diagnosis not present

## 2023-09-14 DIAGNOSIS — R2689 Other abnormalities of gait and mobility: Secondary | ICD-10-CM | POA: Diagnosis not present

## 2023-09-14 DIAGNOSIS — M6389 Disorders of muscle in diseases classified elsewhere, multiple sites: Secondary | ICD-10-CM | POA: Diagnosis not present

## 2023-09-14 DIAGNOSIS — J90 Pleural effusion, not elsewhere classified: Secondary | ICD-10-CM | POA: Diagnosis not present

## 2023-09-14 DIAGNOSIS — R0989 Other specified symptoms and signs involving the circulatory and respiratory systems: Secondary | ICD-10-CM | POA: Diagnosis not present

## 2023-09-14 DIAGNOSIS — M6281 Muscle weakness (generalized): Secondary | ICD-10-CM | POA: Diagnosis not present

## 2023-09-14 DIAGNOSIS — J9611 Chronic respiratory failure with hypoxia: Secondary | ICD-10-CM | POA: Diagnosis not present

## 2023-09-14 NOTE — Progress Notes (Signed)
Subjective:   PATIENT ID: [name] French GENDER: female DOB: 07-06-1926, MRN: [id number]  Chief Complaint  Patient presents with   Chronic hypoxemic respiratory failure    Reason for Visit: F/u shortness of breath  Ms. [name] French is a 89 year old female former smoker with right breast cancer with relapse 2021 s/p lumpectomy and chemotherapy, right NSCLC s/p radiation in 2018, chronic left pleural effusion. HTN, HLD who presents for follow-up shortness of breath.  Initial consult She reports no symptom at rest however as significant shortness of breath with moderate distances. Uses a walker at [facility]. Has associated with wheezing. Has post nasal drip associated with cough. She has been seen by Cardiology in Feb 2024 and was recommended to take lasix 20 mg daily but she takes it 3-4 days out of the week and only will restart when she has lower extremity swelling R>L. She has a chronic pleural effusion related to her lung cancer that has been present for >4 years but it has not previously debilitated. She reports progressively worsening shortness of breath in th last six months. Previously was participating water aerobics last winter. Now not doing any activity. Denies illness prior to this.  09/14/23 Since our last visit thoracentesis on 9/11 with 1.1L removed. She has been diuresed and feeling dry. She was seen by Dr. Elwyn Lade in Cardiology/Heart Failure clinic on 09/13/23 and torsemide was reduced to 20 mg daily. Denies shortness of breath, chest pain, leg swelling.  Social History: Quit smoking 44 years ago.   Past Medical History:  Diagnosis Date   Allergic rhinitis    Anemia    during pregnancy   Arthritis    Breast cancer (HCC) 05/08/13   right upper inner, invasive mammary   Breast cancer, right breast (HCC) 04/16/2013   Underwent lumpectomy on 11/06/13. 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   Use of letrozole (Femara)    neoadjuvant antiestrogen therapy with letrozole 2.5 mg daily x 7 monhts     Family History  Problem Relation Age of Onset   Heart disease Mother    Prostate cancer Father    Lung cancer Sister      Social History   Occupational History   Occupation: Retired  Tobacco Use   Smoking status: Former    Current packs/day: 0.00    Average packs/day: 0.5 packs/day for 25.0 years (12.5 ttl pk-yrs)    Types: Cigarettes    Start date: 12/19/1953    Quit date: 12/19/1978    Years since quitting: 44.7   Smokeless tobacco: Never  Vaping Use   Vaping status: Never Used  Substance and Sexual Activity   Alcohol use: Yes    Comment: "wine usually, no taste in the last in past few weeks- (02/07/2020)   Drug use: No   Sexual activity: Not Currently    Comment: menarche at age14, menopause  in 67, HRT x 15, age at first live birth 75's, G28 P3    Allergies  Allergen Reactions   Other Anaphylaxis    Lobster- "years ago"   Codeine Itching    Insomnia    Hydrocodone Itching and Other (See Comments)    Insomnia   Neosporin [Neomycin-Bacitracin Zn-Polymyx] Rash     Outpatient Medications Prior to Visit  Medication Sig Dispense Refill   aspirin 81 MG tablet Take 81 mg by mouth daily.     B Complex-C-Folic Acid (SUPER B COMPLEX/FA/VIT C) TABS Take by mouth.     cetirizine (ZYRTEC) 10 MG tablet Take 10 mg by mouth daily.     Cholecalciferol (VITAMIN D-3) 1000 UNITS CAPS Take 2,000 Units by mouth daily.     doxylamine, Sleep, (UNISOM) 25 MG tablet Take 25 mg by mouth at bedtime.     escitalopram (LEXAPRO) 5 MG  tablet Take 1 tablet (5 mg total) by mouth daily.     famotidine (PEPCID) 20 MG tablet Take 1 tablet (20 mg total) by mouth daily.     fluticasone (FLONASE) 50 MCG/ACT nasal spray Place 2 sprays into both nostrils daily. 16 g 6   levalbuterol (XOPENEX) 0.63 MG/3ML nebulizer solution Take 3 mLs (0.63 mg total) by nebulization every 6 (six) hours as needed for wheezing or shortness of breath.     metoprolol tartrate (LOPRESSOR) 25 MG tablet Take 1 tablet (25 mg total) by mouth in the morning.     Multiple Vitamins-Minerals (PRESERVISION AREDS 2) CAPS Take 1 mg by mouth in the morning and at bedtime.     nitroGLYCERIN (NITROSTAT) 0.4 MG SL tablet Place 0.4 mg under the tongue every 5 (five) minutes as needed for chest pain.     OXYGEN Inhale 3 L into the lungs as directed. Every Shift; Shift 1, Shift 2, Shift 3     potassium chloride (KLOR-CON M) 10 MEQ tablet Take 10 mEq by mouth daily.     pravastatin (PRAVACHOL) 40 MG tablet Take 40 mg daily by mouth.      Probiotic Product (ALIGN) 4 MG CAPS Take 4 mg by mouth daily. 1 capsule oral once a day     torsemide (DEMADEX) 20 MG tablet Take 1 tablet (20 mg total) by mouth daily. 1 tablet once a morning     metoprolol tartrate (LOPRESSOR) 25 MG tablet Take 12.5 mg by mouth at bedtime.     potassium chloride (KLOR-CON) 10 MEQ tablet Take 10 mEq by mouth daily.     No facility-administered medications prior to visit.    Review of Systems  Constitutional:  Negative for chills, diaphoresis, fever, malaise/fatigue and weight loss.  HENT:  Positive for congestion.   Respiratory:  Positive for shortness of breath. Negative for cough, hemoptysis, sputum production and wheezing.   Cardiovascular:  Negative for chest pain, palpitations and leg swelling.     Objective:   Vitals:   09/14/23 1053 09/14/23 1057  BP:  (!) 102/52  Pulse:  94  SpO2:  96%  Weight: 118 lb (53.5 kg)   Height: 5\' 5"  (1.651 m)    SpO2: 96 % O2 Device: Nasal cannula O2 Flow  Rate (L/min): 2 L/min O2 Type: Continuous O2  Physical Exam: General: Thin, elderly-appearing, no acute distress HENT: [place], AT Eyes: EOMI, no scleral icterus Respiratory: Diminished breath sounds bilaterally, L>R.  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 No PE. Dense LLL consolidation. Unchanged loculated left pleural effusion. Chronic since 2019 in setting of lung cancer CXR 09/14/23 - On my personal read, chronic loculated effusion. Very small recurrence of right pleural effusion  PFT: None on file  Labs: CBC    Component Value Date/Time   WBC 4.8 08/25/2023 0000   WBC 10.1 07/26/2021 1026   RBC 3.66 (A) 08/25/2023 0000   HGB 10.2 (A) 08/25/2023 0000   HGB 13.1 12/22/2022 1558   HGB 14.4 04/13/2015 0946   HCT 31 (A) 08/25/2023 0000   HCT 39.6 12/22/2022 1558   HCT 44.2 04/13/2015 0946   PLT 171 08/25/2023 0000   PLT 238 12/22/2022 1558   MCV 87 12/22/2022 1558   MCV 90.2 04/13/2015 0946   MCH 28.8 12/22/2022 1558   MCH 28.5 07/26/2021 1026   MCHC 33.1 12/22/2022 1558   MCHC 33.2 07/26/2021 1026   RDW 13.4 12/22/2022 1558   RDW 13.7 04/13/2015 0946   LYMPHSABS 0.8 12/22/2022 1558   LYMPHSABS 1.8 04/13/2015 0946   MONOABS 0.5 09/14/2020 1000   MONOABS 0.4 04/13/2015 0946   EOSABS 0.2 12/22/2022 1558   BASOSABS 0.1 12/22/2022 1558   BASOSABS 0.1 04/13/2015 0946   Absolute eos 12/22/22 - 2000  SATURATION QUALIFICATIONS: (This note is used to comply with regulatory documentation for home oxygen) 07/04/23 [name] French, [name] French   Patient Saturations on Room Air at Rest = 96%   Patient Saturations on Room Air while Ambulating = 88%   Patient Saturations on 2 Liters of oxygen while Ambulating = 98%       Assessment & Plan:    Discussion: 89 year old female former smoker with right breast cancer with relapse 2021 s/p lumpectomy and chemotherapy, right NSCLC s/p radiation in 2018, chronic left pleural effusion. HTN, HLD who presents for follow-up for chronic pleural effusion. Chronic left effusion stable. Right pleural effusion s/p thora with very small recurrence on CXR today.  Suspect hypoxemia multifactorial in setting of chronic pleural effusion, deconditioning and suspected secondary pulmonary hypertension. Doubt PAH in setting of chronic lung disease. CT with no stigmata of ILD. Her overall conditions are not reversible and best management would be oxygen support as needed and medical management with diuresis with Cardiology.  Chronic hypoxemic respiratory failure Dyspnea on exertion Chronic pleural effusion Muscular deconditioning --CONTINUE 2L oxygen with activity and sleep for goal >90%  Right pleural effusion s/p thora on 08/30/23 --ORDER CXR. Will call with results  ADDENDUM: Recurrence of very small right pleural effusion. No thoracentesis indicated. Will follow at next visit or sooner if she develops shortness of breath or chest pain.  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 Elwyn Lade   Health Maintenance Immunization History  Administered Date(s) Administered   Fluad Quad(high Dose 65+) 09/14/2020   Influenza, High Dose Seasonal PF 10/06/2017   Influenza-Unspecified 09/18/2012, 09/27/2013, 09/24/2014, 10/01/2016   Moderna SARS-COV2 Booster Vaccination 05/21/2021   Moderna Sars-Covid-2 Vaccination 12/28/2019, 01/29/2020   Pneumococcal Conjugate-13 03/18/2014   Pneumococcal Polysaccharide-23 12/04/2008, 02/19/2013   Td 12/04/2008, 04/11/2018   Zoster, Live 12/04/2008   CT Lung Screen - not qualified  Orders Placed This Encounter  Procedures    DG Chest 2 View    Standing Status:   Future    Number of  Occurrences:   1    Standing Expiration Date:   09/13/2024    Order Specific Question:   Reason for Exam (SYMPTOM  OR DIAGNOSIS REQUIRED)    Answer:   pleural effusion    Order Specific Question:   Preferred imaging location?    Answer:   [facility]  No orders of the defined types were placed in this encounter.   Return in about 4 months (around 01/14/2024).  I have spent a total time of 35-minutes on the day of the appointment including chart review, data review, collecting history, coordinating care and discussing medical diagnosis and plan with the patient/family. Past medical history, allergies, medications were reviewed. Pertinent imaging, labs and tests included in this note have been reviewed and interpreted independently by me.   [name] French Mechele Collin, MD [facility] 09/14/2023 11:13 AM  Office Number [phone]

## 2023-09-14 NOTE — Patient Instructions (Signed)
Chronic hypoxemic respiratory failure Dyspnea on exertion Chronic pleural effusion Muscular deconditioning --CONTINUE 2L oxygen with activity and sleep for goal >90% --ORDER CXR. Will call with results  Secondary pulmonary hypertension Severe TR, MR RHC of low utility since her primary treatment options are still limited to diuretics --Torsemide 20 mg  --Followed by Cardiology/HF with Dr. Elwyn Lade

## 2023-09-15 DIAGNOSIS — R278 Other lack of coordination: Secondary | ICD-10-CM | POA: Diagnosis not present

## 2023-09-15 DIAGNOSIS — R2689 Other abnormalities of gait and mobility: Secondary | ICD-10-CM | POA: Diagnosis not present

## 2023-09-15 DIAGNOSIS — J9601 Acute respiratory failure with hypoxia: Secondary | ICD-10-CM | POA: Diagnosis not present

## 2023-09-15 DIAGNOSIS — M6389 Disorders of muscle in diseases classified elsewhere, multiple sites: Secondary | ICD-10-CM | POA: Diagnosis not present

## 2023-09-15 DIAGNOSIS — M6281 Muscle weakness (generalized): Secondary | ICD-10-CM | POA: Diagnosis not present

## 2023-09-18 ENCOUNTER — Telehealth (HOSPITAL_BASED_OUTPATIENT_CLINIC_OR_DEPARTMENT_OTHER): Payer: Self-pay | Admitting: Pulmonary Disease

## 2023-09-18 ENCOUNTER — Encounter (HOSPITAL_BASED_OUTPATIENT_CLINIC_OR_DEPARTMENT_OTHER): Payer: Self-pay | Admitting: Pulmonary Disease

## 2023-09-18 DIAGNOSIS — M6389 Disorders of muscle in diseases classified elsewhere, multiple sites: Secondary | ICD-10-CM | POA: Diagnosis not present

## 2023-09-18 DIAGNOSIS — R278 Other lack of coordination: Secondary | ICD-10-CM | POA: Diagnosis not present

## 2023-09-18 DIAGNOSIS — R2689 Other abnormalities of gait and mobility: Secondary | ICD-10-CM | POA: Diagnosis not present

## 2023-09-18 DIAGNOSIS — J9601 Acute respiratory failure with hypoxia: Secondary | ICD-10-CM | POA: Diagnosis not present

## 2023-09-18 DIAGNOSIS — M6281 Muscle weakness (generalized): Secondary | ICD-10-CM | POA: Diagnosis not present

## 2023-09-19 DIAGNOSIS — M6389 Disorders of muscle in diseases classified elsewhere, multiple sites: Secondary | ICD-10-CM | POA: Diagnosis not present

## 2023-09-19 DIAGNOSIS — J9601 Acute respiratory failure with hypoxia: Secondary | ICD-10-CM | POA: Diagnosis not present

## 2023-09-19 DIAGNOSIS — R1312 Dysphagia, oropharyngeal phase: Secondary | ICD-10-CM | POA: Diagnosis not present

## 2023-09-19 DIAGNOSIS — R278 Other lack of coordination: Secondary | ICD-10-CM | POA: Diagnosis not present

## 2023-09-19 DIAGNOSIS — M6281 Muscle weakness (generalized): Secondary | ICD-10-CM | POA: Diagnosis not present

## 2023-09-19 DIAGNOSIS — R2689 Other abnormalities of gait and mobility: Secondary | ICD-10-CM | POA: Diagnosis not present

## 2023-09-19 DIAGNOSIS — J9610 Chronic respiratory failure, unspecified whether with hypoxia or hypercapnia: Secondary | ICD-10-CM | POA: Diagnosis not present

## 2023-09-19 NOTE — Telephone Encounter (Signed)
Patient called and notified. She appreciated the call. Closing the encounter.

## 2023-09-20 DIAGNOSIS — R2689 Other abnormalities of gait and mobility: Secondary | ICD-10-CM | POA: Diagnosis not present

## 2023-09-20 DIAGNOSIS — R278 Other lack of coordination: Secondary | ICD-10-CM | POA: Diagnosis not present

## 2023-09-20 DIAGNOSIS — M6281 Muscle weakness (generalized): Secondary | ICD-10-CM | POA: Diagnosis not present

## 2023-09-20 DIAGNOSIS — J9610 Chronic respiratory failure, unspecified whether with hypoxia or hypercapnia: Secondary | ICD-10-CM | POA: Diagnosis not present

## 2023-09-20 DIAGNOSIS — M6389 Disorders of muscle in diseases classified elsewhere, multiple sites: Secondary | ICD-10-CM | POA: Diagnosis not present

## 2023-09-20 DIAGNOSIS — R1312 Dysphagia, oropharyngeal phase: Secondary | ICD-10-CM | POA: Diagnosis not present

## 2023-09-21 DIAGNOSIS — M6281 Muscle weakness (generalized): Secondary | ICD-10-CM | POA: Diagnosis not present

## 2023-09-21 DIAGNOSIS — J9610 Chronic respiratory failure, unspecified whether with hypoxia or hypercapnia: Secondary | ICD-10-CM | POA: Diagnosis not present

## 2023-09-21 DIAGNOSIS — R2689 Other abnormalities of gait and mobility: Secondary | ICD-10-CM | POA: Diagnosis not present

## 2023-09-21 DIAGNOSIS — R278 Other lack of coordination: Secondary | ICD-10-CM | POA: Diagnosis not present

## 2023-09-21 DIAGNOSIS — R1312 Dysphagia, oropharyngeal phase: Secondary | ICD-10-CM | POA: Diagnosis not present

## 2023-09-21 DIAGNOSIS — M6389 Disorders of muscle in diseases classified elsewhere, multiple sites: Secondary | ICD-10-CM | POA: Diagnosis not present

## 2023-09-22 DIAGNOSIS — M6389 Disorders of muscle in diseases classified elsewhere, multiple sites: Secondary | ICD-10-CM | POA: Diagnosis not present

## 2023-09-22 DIAGNOSIS — R278 Other lack of coordination: Secondary | ICD-10-CM | POA: Diagnosis not present

## 2023-09-22 DIAGNOSIS — M6281 Muscle weakness (generalized): Secondary | ICD-10-CM | POA: Diagnosis not present

## 2023-09-22 DIAGNOSIS — J9610 Chronic respiratory failure, unspecified whether with hypoxia or hypercapnia: Secondary | ICD-10-CM | POA: Diagnosis not present

## 2023-09-22 DIAGNOSIS — R1312 Dysphagia, oropharyngeal phase: Secondary | ICD-10-CM | POA: Diagnosis not present

## 2023-09-22 DIAGNOSIS — R2689 Other abnormalities of gait and mobility: Secondary | ICD-10-CM | POA: Diagnosis not present

## 2023-09-23 DIAGNOSIS — J9610 Chronic respiratory failure, unspecified whether with hypoxia or hypercapnia: Secondary | ICD-10-CM | POA: Diagnosis not present

## 2023-09-23 DIAGNOSIS — R2689 Other abnormalities of gait and mobility: Secondary | ICD-10-CM | POA: Diagnosis not present

## 2023-09-23 DIAGNOSIS — M6281 Muscle weakness (generalized): Secondary | ICD-10-CM | POA: Diagnosis not present

## 2023-09-23 DIAGNOSIS — R1312 Dysphagia, oropharyngeal phase: Secondary | ICD-10-CM | POA: Diagnosis not present

## 2023-09-23 DIAGNOSIS — R278 Other lack of coordination: Secondary | ICD-10-CM | POA: Diagnosis not present

## 2023-09-23 DIAGNOSIS — M6389 Disorders of muscle in diseases classified elsewhere, multiple sites: Secondary | ICD-10-CM | POA: Diagnosis not present

## 2023-09-25 DIAGNOSIS — R1312 Dysphagia, oropharyngeal phase: Secondary | ICD-10-CM | POA: Diagnosis not present

## 2023-09-25 DIAGNOSIS — M6281 Muscle weakness (generalized): Secondary | ICD-10-CM | POA: Diagnosis not present

## 2023-09-25 DIAGNOSIS — M6389 Disorders of muscle in diseases classified elsewhere, multiple sites: Secondary | ICD-10-CM | POA: Diagnosis not present

## 2023-09-25 DIAGNOSIS — R278 Other lack of coordination: Secondary | ICD-10-CM | POA: Diagnosis not present

## 2023-09-25 DIAGNOSIS — J9610 Chronic respiratory failure, unspecified whether with hypoxia or hypercapnia: Secondary | ICD-10-CM | POA: Diagnosis not present

## 2023-09-25 DIAGNOSIS — R2689 Other abnormalities of gait and mobility: Secondary | ICD-10-CM | POA: Diagnosis not present

## 2023-09-26 DIAGNOSIS — R1312 Dysphagia, oropharyngeal phase: Secondary | ICD-10-CM | POA: Diagnosis not present

## 2023-09-26 DIAGNOSIS — M6281 Muscle weakness (generalized): Secondary | ICD-10-CM | POA: Diagnosis not present

## 2023-09-26 DIAGNOSIS — R278 Other lack of coordination: Secondary | ICD-10-CM | POA: Diagnosis not present

## 2023-09-26 DIAGNOSIS — J9610 Chronic respiratory failure, unspecified whether with hypoxia or hypercapnia: Secondary | ICD-10-CM | POA: Diagnosis not present

## 2023-09-26 DIAGNOSIS — M6389 Disorders of muscle in diseases classified elsewhere, multiple sites: Secondary | ICD-10-CM | POA: Diagnosis not present

## 2023-09-26 DIAGNOSIS — R2689 Other abnormalities of gait and mobility: Secondary | ICD-10-CM | POA: Diagnosis not present

## 2023-09-27 DIAGNOSIS — M6389 Disorders of muscle in diseases classified elsewhere, multiple sites: Secondary | ICD-10-CM | POA: Diagnosis not present

## 2023-09-27 DIAGNOSIS — J9610 Chronic respiratory failure, unspecified whether with hypoxia or hypercapnia: Secondary | ICD-10-CM | POA: Diagnosis not present

## 2023-09-27 DIAGNOSIS — M6281 Muscle weakness (generalized): Secondary | ICD-10-CM | POA: Diagnosis not present

## 2023-09-27 DIAGNOSIS — R1312 Dysphagia, oropharyngeal phase: Secondary | ICD-10-CM | POA: Diagnosis not present

## 2023-09-27 DIAGNOSIS — R278 Other lack of coordination: Secondary | ICD-10-CM | POA: Diagnosis not present

## 2023-09-27 DIAGNOSIS — R2689 Other abnormalities of gait and mobility: Secondary | ICD-10-CM | POA: Diagnosis not present

## 2023-09-28 DIAGNOSIS — R2689 Other abnormalities of gait and mobility: Secondary | ICD-10-CM | POA: Diagnosis not present

## 2023-09-28 DIAGNOSIS — R278 Other lack of coordination: Secondary | ICD-10-CM | POA: Diagnosis not present

## 2023-09-28 DIAGNOSIS — R1312 Dysphagia, oropharyngeal phase: Secondary | ICD-10-CM | POA: Diagnosis not present

## 2023-09-28 DIAGNOSIS — M6389 Disorders of muscle in diseases classified elsewhere, multiple sites: Secondary | ICD-10-CM | POA: Diagnosis not present

## 2023-09-28 DIAGNOSIS — M6281 Muscle weakness (generalized): Secondary | ICD-10-CM | POA: Diagnosis not present

## 2023-09-28 DIAGNOSIS — J9610 Chronic respiratory failure, unspecified whether with hypoxia or hypercapnia: Secondary | ICD-10-CM | POA: Diagnosis not present

## 2023-09-29 DIAGNOSIS — R278 Other lack of coordination: Secondary | ICD-10-CM | POA: Diagnosis not present

## 2023-09-29 DIAGNOSIS — J9610 Chronic respiratory failure, unspecified whether with hypoxia or hypercapnia: Secondary | ICD-10-CM | POA: Diagnosis not present

## 2023-09-29 DIAGNOSIS — M6389 Disorders of muscle in diseases classified elsewhere, multiple sites: Secondary | ICD-10-CM | POA: Diagnosis not present

## 2023-09-29 DIAGNOSIS — R2689 Other abnormalities of gait and mobility: Secondary | ICD-10-CM | POA: Diagnosis not present

## 2023-09-29 DIAGNOSIS — R1312 Dysphagia, oropharyngeal phase: Secondary | ICD-10-CM | POA: Diagnosis not present

## 2023-09-29 DIAGNOSIS — M6281 Muscle weakness (generalized): Secondary | ICD-10-CM | POA: Diagnosis not present

## 2023-10-02 DIAGNOSIS — M6389 Disorders of muscle in diseases classified elsewhere, multiple sites: Secondary | ICD-10-CM | POA: Diagnosis not present

## 2023-10-02 DIAGNOSIS — R278 Other lack of coordination: Secondary | ICD-10-CM | POA: Diagnosis not present

## 2023-10-02 DIAGNOSIS — J9610 Chronic respiratory failure, unspecified whether with hypoxia or hypercapnia: Secondary | ICD-10-CM | POA: Diagnosis not present

## 2023-10-02 DIAGNOSIS — R1312 Dysphagia, oropharyngeal phase: Secondary | ICD-10-CM | POA: Diagnosis not present

## 2023-10-02 DIAGNOSIS — M6281 Muscle weakness (generalized): Secondary | ICD-10-CM | POA: Diagnosis not present

## 2023-10-02 DIAGNOSIS — R2689 Other abnormalities of gait and mobility: Secondary | ICD-10-CM | POA: Diagnosis not present

## 2023-10-03 ENCOUNTER — Ambulatory Visit: Payer: Medicare Other | Admitting: Cardiology

## 2023-10-03 DIAGNOSIS — R2689 Other abnormalities of gait and mobility: Secondary | ICD-10-CM | POA: Diagnosis not present

## 2023-10-03 DIAGNOSIS — J9610 Chronic respiratory failure, unspecified whether with hypoxia or hypercapnia: Secondary | ICD-10-CM | POA: Diagnosis not present

## 2023-10-03 DIAGNOSIS — R1312 Dysphagia, oropharyngeal phase: Secondary | ICD-10-CM | POA: Diagnosis not present

## 2023-10-03 DIAGNOSIS — M6281 Muscle weakness (generalized): Secondary | ICD-10-CM | POA: Diagnosis not present

## 2023-10-03 DIAGNOSIS — R278 Other lack of coordination: Secondary | ICD-10-CM | POA: Diagnosis not present

## 2023-10-03 DIAGNOSIS — M6389 Disorders of muscle in diseases classified elsewhere, multiple sites: Secondary | ICD-10-CM | POA: Diagnosis not present

## 2023-10-04 DIAGNOSIS — R2689 Other abnormalities of gait and mobility: Secondary | ICD-10-CM | POA: Diagnosis not present

## 2023-10-04 DIAGNOSIS — M6281 Muscle weakness (generalized): Secondary | ICD-10-CM | POA: Diagnosis not present

## 2023-10-04 DIAGNOSIS — J9610 Chronic respiratory failure, unspecified whether with hypoxia or hypercapnia: Secondary | ICD-10-CM | POA: Diagnosis not present

## 2023-10-04 DIAGNOSIS — M6389 Disorders of muscle in diseases classified elsewhere, multiple sites: Secondary | ICD-10-CM | POA: Diagnosis not present

## 2023-10-04 DIAGNOSIS — R1312 Dysphagia, oropharyngeal phase: Secondary | ICD-10-CM | POA: Diagnosis not present

## 2023-10-04 DIAGNOSIS — R278 Other lack of coordination: Secondary | ICD-10-CM | POA: Diagnosis not present

## 2023-10-06 DIAGNOSIS — R278 Other lack of coordination: Secondary | ICD-10-CM | POA: Diagnosis not present

## 2023-10-06 DIAGNOSIS — M6389 Disorders of muscle in diseases classified elsewhere, multiple sites: Secondary | ICD-10-CM | POA: Diagnosis not present

## 2023-10-06 DIAGNOSIS — M6281 Muscle weakness (generalized): Secondary | ICD-10-CM | POA: Diagnosis not present

## 2023-10-06 DIAGNOSIS — J9610 Chronic respiratory failure, unspecified whether with hypoxia or hypercapnia: Secondary | ICD-10-CM | POA: Diagnosis not present

## 2023-10-06 DIAGNOSIS — R2689 Other abnormalities of gait and mobility: Secondary | ICD-10-CM | POA: Diagnosis not present

## 2023-10-06 DIAGNOSIS — R1312 Dysphagia, oropharyngeal phase: Secondary | ICD-10-CM | POA: Diagnosis not present

## 2023-10-07 DIAGNOSIS — R278 Other lack of coordination: Secondary | ICD-10-CM | POA: Diagnosis not present

## 2023-10-07 DIAGNOSIS — M6281 Muscle weakness (generalized): Secondary | ICD-10-CM | POA: Diagnosis not present

## 2023-10-07 DIAGNOSIS — R1312 Dysphagia, oropharyngeal phase: Secondary | ICD-10-CM | POA: Diagnosis not present

## 2023-10-07 DIAGNOSIS — J9610 Chronic respiratory failure, unspecified whether with hypoxia or hypercapnia: Secondary | ICD-10-CM | POA: Diagnosis not present

## 2023-10-07 DIAGNOSIS — R2689 Other abnormalities of gait and mobility: Secondary | ICD-10-CM | POA: Diagnosis not present

## 2023-10-07 DIAGNOSIS — M6389 Disorders of muscle in diseases classified elsewhere, multiple sites: Secondary | ICD-10-CM | POA: Diagnosis not present

## 2023-10-10 DIAGNOSIS — M6281 Muscle weakness (generalized): Secondary | ICD-10-CM | POA: Diagnosis not present

## 2023-10-10 DIAGNOSIS — R278 Other lack of coordination: Secondary | ICD-10-CM | POA: Diagnosis not present

## 2023-10-10 DIAGNOSIS — J9610 Chronic respiratory failure, unspecified whether with hypoxia or hypercapnia: Secondary | ICD-10-CM | POA: Diagnosis not present

## 2023-10-10 DIAGNOSIS — R2689 Other abnormalities of gait and mobility: Secondary | ICD-10-CM | POA: Diagnosis not present

## 2023-10-10 DIAGNOSIS — R1312 Dysphagia, oropharyngeal phase: Secondary | ICD-10-CM | POA: Diagnosis not present

## 2023-10-10 DIAGNOSIS — M6389 Disorders of muscle in diseases classified elsewhere, multiple sites: Secondary | ICD-10-CM | POA: Diagnosis not present

## 2023-10-11 ENCOUNTER — Encounter: Payer: Self-pay | Admitting: Orthopedic Surgery

## 2023-10-11 ENCOUNTER — Non-Acute Institutional Stay (SKILLED_NURSING_FACILITY): Payer: Self-pay | Admitting: Orthopedic Surgery

## 2023-10-11 DIAGNOSIS — F411 Generalized anxiety disorder: Secondary | ICD-10-CM

## 2023-10-11 DIAGNOSIS — J90 Pleural effusion, not elsewhere classified: Secondary | ICD-10-CM

## 2023-10-11 DIAGNOSIS — C3491 Malignant neoplasm of unspecified part of right bronchus or lung: Secondary | ICD-10-CM

## 2023-10-11 DIAGNOSIS — J9611 Chronic respiratory failure with hypoxia: Secondary | ICD-10-CM | POA: Diagnosis not present

## 2023-10-11 DIAGNOSIS — I272 Pulmonary hypertension, unspecified: Secondary | ICD-10-CM

## 2023-10-11 DIAGNOSIS — I5032 Chronic diastolic (congestive) heart failure: Secondary | ICD-10-CM | POA: Diagnosis not present

## 2023-10-11 NOTE — Progress Notes (Signed)
Location:  [facility] Room Number: 151 Place of Service:  SNF [phone]) Provider:  Octavia Heir, NP   Mahlon Gammon, MD  Patient Care Team: Mahlon Gammon, MD as PCP - General (Internal Medicine) [place], Peter M, MD as PCP - Cardiology (Cardiology) Serena Croissant, MD as Consulting Physician (Hematology and Oncology)  Extended Emergency Contact Information Primary Emergency Contact: [name] [place] Home Phone: [phone] Relation: Daughter Secondary Emergency Contact: [name] [place] Home Phone: [phone] Mobile Phone: [phone] Relation: Son  Code Status:  DNR Goals of care: Advanced Directive information    09/04/2023   10:16 AM  Advanced Directives  Does Patient Have a Medical Advance Directive? Yes  Type of Advance Directive Out of facility DNR (pink MOST or yellow form)  Does patient want to make changes to medical advance directive? No - Patient declined  Copy of Healthcare Power of Attorney in Chart? Yes - validated most recent copy scanned in chart (See row information)  Pre-existing out of facility DNR order (yellow form or pink MOST form) Yellow form placed in chart (order not valid for inpatient use)     Chief Complaint  Patient presents with   Discharge Note    HPI:  Pt is a 89 y.o. female seen today for discharge evaluation.   She currently resides on the skilled nursing unit at [facility]. PMH: pulmonary HTN, CHF, severe TR/MR, non-small cell cancer of right/left lung, chronic left pleural effusion> 4 years, osteoporosis, OA, breast cancer s/p bilateral lumpectomy 2014 with recurrence 2021 s/p radiation, and unstable gait.   09/05 BNP 723. 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 No new/acute pulmonary embolism.Moderate right pleural effusion, new. Associated compressive atelectasis in the right lower lobe, complete  left lower lobe collapse, likely reflecting a combination of scarring and post radiation change. Small to moderate chronic left pleural effusion with pleural thickening, unchanged." 09/11 she underwent thoracentesis and 1100 cc removed. Due to increased fatigue, weakness and shortness of breath she was admitted to [facility] rehab for monitoring, PT/OT services.   09/26 she was seen by pulmonary. Chronic pleural effusion thought to be multifactorial. Repeat CXR noted moderate left sided pleural effusion, small reoccurrence of right pleural effusion and pulmonary congestion. Repeat thoracentesis was not indicated per Dr. Everardo All. She remains on 2 liters oxygen at this time. She confirms sob with exertion. Remains on torsemide and xopenex nebs.   Appetite fair. Admission weight 121 lbs, now 116.8.   No recent falls. Ambulating short distances with walker. Working on approval for [employer].   09/13 started on Lexapro for anxiety. No recent panic attacks. Na 143 09/11/2023.   She plans to move to [facility] this afternoon. Daughter to assist with move.     Past Medical History:  Diagnosis Date   Allergic rhinitis    Anemia    during pregnancy   Arthritis    Breast cancer (HCC) 05/08/13   right upper inner, invasive mammary   Breast cancer, right breast (HCC) 04/16/2013   Underwent lumpectomy on 11/06/13. 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   Use of letrozole (Femara)    neoadjuvant antiestrogen therapy with letrozole 2.5 mg daily x 7 monhts   Past Surgical History:  Procedure  Laterality Date   BREAST BIOPSY Left 1960   lt br bx/benign   BREAST LUMPECTOMY Right    BREAST LUMPECTOMY Right 02/10/2020   Procedure: RIGHT BREAST LUMPECTOMY;  Surgeon: Abigail Miyamoto, MD;  Location: [facility] OR;  Service: General;  Laterality: Right;   BREAST LUMPECTOMY WITH NEEDLE LOCALIZATION Bilateral 11/06/2013   Procedure: BREAST LUMPECTOMY WITH NEEDLE LOCALIZATION;  Surgeon: Currie Paris, MD;  Location: [facility];  Service: General;  Laterality: Bilateral;   COLONOSCOPY     EYE SURGERY Bilateral    both cataracts   MOHS SURGERY Right    nose basal/squamous   THORACENTESIS N/A 08/30/2023   Procedure: THORACENTESIS;  Surgeon: Luciano Cutter, MD;  Location: [facility] ENDOSCOPY;  Service: Pulmonary;  Laterality: N/A;   TOTAL HIP ARTHROPLASTY Left 08/31/2018   Procedure: LEFT TOTAL HIP ARTHROPLASTY ANTERIOR APPROACH;  Surgeon: Kathryne Hitch, MD;  Location: [facility] ORS;  Service: Orthopedics;  Laterality: Left;   VIDEO BRONCHOSCOPY WITH ENDOBRONCHIAL NAVIGATION N/A 11/01/2017   Procedure: VIDEO BRONCHOSCOPY WITH ENDOBRONCHIAL NAVIGATION;  Surgeon: Loreli Slot, MD;  Location: [facility] OR;  Service: Thoracic;  Laterality: N/A;   WRIST SURGERY  1990   lt    Allergies  Allergen Reactions   Other Anaphylaxis    Lobster- "years ago"   Codeine Itching    Insomnia    Hydrocodone Itching and Other (See Comments)    Insomnia   Neosporin [Neomycin-Bacitracin Zn-Polymyx] Rash    Outpatient Encounter Medications as of 10/11/2023  Medication Sig   aspirin 81 MG tablet Take 81 mg by mouth daily.   B Complex-C-Folic Acid (SUPER B COMPLEX/FA/VIT C) TABS Take by mouth.   cetirizine (ZYRTEC) 10 MG tablet Take 10 mg by mouth daily.   Cholecalciferol (VITAMIN D-3) 1000 UNITS CAPS Take 2,000 Units by mouth daily.   doxylamine, Sleep, (UNISOM) 25 MG tablet Take 25 mg by mouth at bedtime.   escitalopram (LEXAPRO) 5 MG tablet Take 1 tablet (5 mg total) by mouth daily.    famotidine (PEPCID) 20 MG tablet Take 1 tablet (20 mg total) by mouth daily.   fluticasone (FLONASE) 50 MCG/ACT nasal spray Place 2 sprays into both nostrils daily.   levalbuterol (XOPENEX) 0.63 MG/3ML nebulizer solution Take 3 mLs (0.63 mg total) by nebulization every 6 (six) hours as needed for wheezing or shortness of breath.   metoprolol tartrate (LOPRESSOR) 25 MG tablet Take 1 tablet (25 mg total) by mouth in the morning.   Multiple Vitamins-Minerals (PRESERVISION AREDS 2) CAPS Take 1 mg by mouth in the morning and at bedtime.   nitroGLYCERIN (NITROSTAT) 0.4 MG SL tablet Place 0.4 mg under the tongue every 5 (five) minutes as needed for chest pain.   OXYGEN Inhale 3 L into the lungs as directed. Every Shift; Shift 1, Shift 2, Shift 3   potassium chloride (KLOR-CON M) 10 MEQ tablet Take 10 mEq by mouth daily.   pravastatin (PRAVACHOL) 40 MG tablet Take 40 mg daily by mouth.    Probiotic Product (ALIGN) 4 MG CAPS Take 4 mg by mouth daily.  1 capsule oral once a day   torsemide (DEMADEX) 20 MG tablet Take 1 tablet (20 mg total) by mouth daily. 1 tablet once a morning   No facility-administered encounter medications on file as of 10/11/2023.    Review of Systems  Constitutional:  Negative for activity change and appetite change.  HENT:  Negative for sore throat and trouble swallowing.   Eyes:  Negative for visual disturbance.  Respiratory:  Positive for shortness of breath. Negative for cough and wheezing.   Cardiovascular:  Negative for chest pain and leg swelling.  Gastrointestinal:  Negative for abdominal distention and abdominal pain.  Genitourinary:  Negative for dysuria, frequency and hematuria.  Musculoskeletal:  Positive for gait problem.  Skin:  Negative for wound.  Neurological:  Positive for weakness. Negative for dizziness and light-headedness.  Psychiatric/Behavioral:  Negative for confusion, dysphoric mood and sleep disturbance. The patient is nervous/anxious.      Immunization History  Administered Date(s) Administered   Fluad Quad(high Dose 65+) 09/14/2020   Influenza, High Dose Seasonal PF 10/06/2017   Influenza-Unspecified 09/18/2012, 09/27/2013, 09/24/2014, 10/01/2016   Moderna SARS-COV2 Booster Vaccination 05/21/2021   Moderna Sars-Covid-2 Vaccination 12/28/2019, 01/29/2020   Pneumococcal Conjugate-13 03/18/2014   Pneumococcal Polysaccharide-23 12/04/2008, 02/19/2013   Td 12/04/2008, 04/11/2018   Zoster, Live 12/04/2008   Pertinent  Health Maintenance Due  Topic Date Due   INFLUENZA VACCINE  07/20/2023   DEXA SCAN  Completed   Colonoscopy  Discontinued      07/27/2021    8:00 AM 02/01/2022   11:19 AM 03/22/2022   11:00 AM 08/03/2022    4:35 PM 08/25/2023    6:56 AM  Fall Risk  Falls in the past year?     0  Was there an injury with Fall?     0  Fall Risk Category Calculator     0  (RETIRED) Patient Fall Risk Level Moderate fall risk Low fall risk Low fall risk Low fall risk   Patient at Risk for Falls Due to     History of fall(s)  Fall risk Follow up     Falls evaluation completed   Functional Status Survey:    Vitals:   10/11/23 1405  BP: (!) 103/53  Pulse: (!) 102  Resp: 18  Temp: 97.8 F (36.6 C)  SpO2: 95%  Weight: 116 lb 12.8 oz (53 kg)  Height: 5\' 5"  (1.651 m)   Body mass index is 19.44 kg/m. Physical Exam Vitals reviewed.  Constitutional:      General: She is not in acute distress. HENT:     Head: Normocephalic.  Eyes:     General:        Right eye: No discharge.        Left eye: No discharge.  Cardiovascular:     Rate and Rhythm: Normal rate and regular rhythm.     Pulses: Normal pulses.     Heart sounds: Murmur heard.  Pulmonary:     Effort: Pulmonary effort is normal. No respiratory distress.     Breath sounds: Examination of the left-middle field reveals decreased breath sounds. Decreased breath sounds present. No wheezing.  Abdominal:     General: Bowel sounds are normal.     Palpations:  Abdomen is soft.  Musculoskeletal:     Cervical back: Neck supple.     Right lower leg: No edema.     Left lower leg: No edema.     Comments: Compression stockings   Skin:    General:  Skin is warm.     Capillary Refill: Capillary refill takes less than 2 seconds.  Neurological:     General: No focal deficit present.     Mental Status: She is alert and oriented to person, place, and time.     Motor: Weakness present.     Gait: Gait abnormal.     Comments: walker  Psychiatric:        Mood and Affect: Mood normal.     Labs reviewed: Recent Labs    12/22/22 1558 01/27/23 1044 08/11/23 1307 08/25/23 0000 08/31/23 0000  NA 129* 133* 143 139 141  K 4.6 4.3 4.1 5.4* 3.9  CL 89* 90* 94* 94* 88*  CO2 24 30* 37* 31* 45*  GLUCOSE 98 109* 99  --   --   BUN 10 10 17 17  22*  CREATININE 0.70 0.72 0.70 0.7 0.8  CALCIUM 9.3 9.5 9.6 9.7 9.3   Recent Labs    08/25/23 0000 08/30/23 1611  AST 32  --   ALT 14  --   ALKPHOS 61  --   PROT  --  6.6  ALBUMIN 3.7  --    Recent Labs    12/22/22 1558 08/25/23 0000  WBC 6.6 4.8  NEUTROABS 4.7  --   HGB 13.1 10.2*  HCT 39.6 31*  MCV 87  --   PLT 238 171   Lab Results  Component Value Date   TSH 2.40 08/25/2023   Lab Results  Component Value Date   HGBA1C 5.6 12/25/2017   Lab Results  Component Value Date   CHOL 158 12/26/2017   HDL 51 12/26/2017   LDLCALC 74 12/26/2017   TRIG 167 (H) 12/26/2017   CHOLHDL 3.1 12/26/2017    Significant Diagnostic Results in last 30 days:  DG Chest 2 View  Result Date: 10/04/2023 CLINICAL DATA:  Pleural effusion EXAM: CHEST - 2 VIEW COMPARISON:  08/30/2023 FINDINGS: Cardiac silhouette is obscured by a moderate left-sided pleural effusion. There is pulmonary vascular congestion. No pneumothorax. Calcified aorta. Right mid lung opacity may represent fluid along the fissure, subsegmental atelectasis or scarring. IMPRESSION: Moderate left-sided pleural effusion. Pulmonary vascular congestion.  Electronically Signed   By: Layla Maw M.D.   On: 10/04/2023 12:19    Assessment/Plan 1. Chronic respiratory failure with hypoxia (HCC) - followed by pulmonary - continue oxygen 2 liters> goal sats> 90% - cont xopenex nebs prn  2. Severe pulmonary hypertension (HCC) - improved with torsemide - followed by CHF clinic - cont daily weights  3. Pleural effusion, right - 09/11 thoracentesis> cytology> no growth - f/u CXR noted small reoccurrence> repeat thoracentesis not advised   4. Pleural effusion on left - chronic   5. Generalized anxiety disorder - 09/13 started on Lexapro - no recent panic - Na 143 09/11/2023  6. Chronic diastolic congestive heart failure (HCC) - cont torsemide and daily weights  7. Non-small cell cancer of right lung (HCC) - followed by Dr. Pamelia Hoit - advised yearly f/u  Advised patient to schedule 2 week follow up in [facility] clinic. PT/OT services to continue in [facility].    Family/ staff Communication: plan discussed with patient and nurse  Labs/tests ordered:  none

## 2023-10-12 DIAGNOSIS — M6281 Muscle weakness (generalized): Secondary | ICD-10-CM | POA: Diagnosis not present

## 2023-10-12 DIAGNOSIS — M6389 Disorders of muscle in diseases classified elsewhere, multiple sites: Secondary | ICD-10-CM | POA: Diagnosis not present

## 2023-10-12 DIAGNOSIS — R278 Other lack of coordination: Secondary | ICD-10-CM | POA: Diagnosis not present

## 2023-10-12 DIAGNOSIS — R1312 Dysphagia, oropharyngeal phase: Secondary | ICD-10-CM | POA: Diagnosis not present

## 2023-10-12 DIAGNOSIS — J9610 Chronic respiratory failure, unspecified whether with hypoxia or hypercapnia: Secondary | ICD-10-CM | POA: Diagnosis not present

## 2023-10-12 DIAGNOSIS — R2689 Other abnormalities of gait and mobility: Secondary | ICD-10-CM | POA: Diagnosis not present

## 2023-10-16 DIAGNOSIS — J9610 Chronic respiratory failure, unspecified whether with hypoxia or hypercapnia: Secondary | ICD-10-CM | POA: Diagnosis not present

## 2023-10-16 DIAGNOSIS — R2689 Other abnormalities of gait and mobility: Secondary | ICD-10-CM | POA: Diagnosis not present

## 2023-10-16 DIAGNOSIS — R278 Other lack of coordination: Secondary | ICD-10-CM | POA: Diagnosis not present

## 2023-10-16 DIAGNOSIS — M6281 Muscle weakness (generalized): Secondary | ICD-10-CM | POA: Diagnosis not present

## 2023-10-16 DIAGNOSIS — M6389 Disorders of muscle in diseases classified elsewhere, multiple sites: Secondary | ICD-10-CM | POA: Diagnosis not present

## 2023-10-16 DIAGNOSIS — R1312 Dysphagia, oropharyngeal phase: Secondary | ICD-10-CM | POA: Diagnosis not present

## 2023-10-23 DIAGNOSIS — R278 Other lack of coordination: Secondary | ICD-10-CM | POA: Diagnosis not present

## 2023-10-23 DIAGNOSIS — J9601 Acute respiratory failure with hypoxia: Secondary | ICD-10-CM | POA: Diagnosis not present

## 2023-10-23 DIAGNOSIS — M6389 Disorders of muscle in diseases classified elsewhere, multiple sites: Secondary | ICD-10-CM | POA: Diagnosis not present

## 2023-10-26 DIAGNOSIS — M6389 Disorders of muscle in diseases classified elsewhere, multiple sites: Secondary | ICD-10-CM | POA: Diagnosis not present

## 2023-10-26 DIAGNOSIS — R278 Other lack of coordination: Secondary | ICD-10-CM | POA: Diagnosis not present

## 2023-10-26 DIAGNOSIS — J9601 Acute respiratory failure with hypoxia: Secondary | ICD-10-CM | POA: Diagnosis not present

## 2023-10-27 DIAGNOSIS — M6389 Disorders of muscle in diseases classified elsewhere, multiple sites: Secondary | ICD-10-CM | POA: Diagnosis not present

## 2023-10-27 DIAGNOSIS — J9601 Acute respiratory failure with hypoxia: Secondary | ICD-10-CM | POA: Diagnosis not present

## 2023-10-27 DIAGNOSIS — R278 Other lack of coordination: Secondary | ICD-10-CM | POA: Diagnosis not present

## 2023-10-31 ENCOUNTER — Encounter: Payer: Self-pay | Admitting: Internal Medicine

## 2023-10-31 ENCOUNTER — Non-Acute Institutional Stay: Payer: Medicare Other | Admitting: Internal Medicine

## 2023-10-31 VITALS — BP 108/64 | HR 99 | Temp 98.0°F | Resp 17 | Ht 65.0 in | Wt 118.6 lb

## 2023-10-31 DIAGNOSIS — J90 Pleural effusion, not elsewhere classified: Secondary | ICD-10-CM | POA: Diagnosis not present

## 2023-10-31 DIAGNOSIS — I272 Pulmonary hypertension, unspecified: Secondary | ICD-10-CM

## 2023-10-31 DIAGNOSIS — J9611 Chronic respiratory failure with hypoxia: Secondary | ICD-10-CM

## 2023-10-31 DIAGNOSIS — I38 Endocarditis, valve unspecified: Secondary | ICD-10-CM

## 2023-10-31 DIAGNOSIS — I5032 Chronic diastolic (congestive) heart failure: Secondary | ICD-10-CM | POA: Diagnosis not present

## 2023-10-31 DIAGNOSIS — R634 Abnormal weight loss: Secondary | ICD-10-CM | POA: Diagnosis not present

## 2023-10-31 DIAGNOSIS — C3491 Malignant neoplasm of unspecified part of right bronchus or lung: Secondary | ICD-10-CM | POA: Diagnosis not present

## 2023-10-31 DIAGNOSIS — F411 Generalized anxiety disorder: Secondary | ICD-10-CM | POA: Diagnosis not present

## 2023-10-31 NOTE — Progress Notes (Unsigned)
Location:  [facility]    Place of Service:   Clinic  Provider:   Code Status: DNR Goals of Care:     10/31/2023   10:54 AM  Advanced Directives  Does Patient Have a Medical Advance Directive? Yes  Type of Advance Directive Out of facility DNR (pink MOST or yellow form)  Does patient want to make changes to medical advance directive? No - Patient declined  Copy of Healthcare Power of Attorney in Chart? Yes - validated most recent copy scanned in chart (See row information)  Pre-existing out of facility DNR order (yellow form or pink MOST form) Yellow form placed in chart (order not valid for inpatient use)     Chief Complaint  Patient presents with   Medical Management of Chronic Issues    Patient is being seen for a follow up    Immunizations    Patient is due for flu, covid and shingles vaccine     HPI: Patient is a 89 y.o. female seen today for medical management of chronic diseases.   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   Doing well Uses Oxygen all the time Cough with mucus  SOB with  exertion  Has lost almost 10 lbs since 09/04/23 but now maintaining it Poor  Appetite . Uses Power chair to ambulate  Past Medical History:  Diagnosis Date   Allergic rhinitis    Anemia    during pregnancy   Arthritis    Breast cancer (HCC) 05/08/13   right upper inner, invasive mammary   Breast cancer, right breast (HCC) 04/16/2013   Underwent lumpectomy on 11/06/13. 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   Use of letrozole (Femara)    neoadjuvant antiestrogen therapy with letrozole 2.5 mg daily x 7 monhts    Past Surgical History:  Procedure Laterality Date   BREAST BIOPSY Left 1960   lt br bx/benign   BREAST LUMPECTOMY Right    BREAST LUMPECTOMY Right 02/10/2020   Procedure: RIGHT BREAST LUMPECTOMY;  Surgeon: Abigail Miyamoto, MD;  Location: [facility] OR;  Service: General;  Laterality: Right;   BREAST LUMPECTOMY WITH NEEDLE LOCALIZATION Bilateral 11/06/2013   Procedure: BREAST LUMPECTOMY WITH NEEDLE LOCALIZATION;  Surgeon: Currie Paris, MD;  Location: [facility];  Service: General;  Laterality: Bilateral;   COLONOSCOPY     EYE SURGERY Bilateral    both cataracts   MOHS SURGERY Right    nose basal/squamous   THORACENTESIS N/A 08/30/2023   Procedure: THORACENTESIS;  Surgeon: Luciano Cutter, MD;  Location: [facility] ENDOSCOPY;  Service: Pulmonary;  Laterality: N/A;   TOTAL HIP ARTHROPLASTY Left 08/31/2018   Procedure: LEFT TOTAL HIP ARTHROPLASTY ANTERIOR APPROACH;  Surgeon: Kathryne Hitch, MD;  Location: [facility] ORS;  Service: Orthopedics;   Laterality: Left;   VIDEO BRONCHOSCOPY WITH ENDOBRONCHIAL NAVIGATION N/A 11/01/2017   Procedure: VIDEO BRONCHOSCOPY WITH ENDOBRONCHIAL NAVIGATION;  Surgeon: Loreli Slot, MD;  Location: [facility] OR;  Service: Thoracic;  Laterality: N/A;   WRIST SURGERY  1990   lt    Allergies  Allergen Reactions   Other Anaphylaxis    Lobster- "years ago"   Codeine Itching    Insomnia    Hydrocodone Itching and Other (See Comments)    Insomnia   Neosporin [Neomycin-Bacitracin Zn-Polymyx] Rash    Outpatient Encounter Medications as of 10/31/2023  Medication Sig   aspirin 81 MG tablet Take 81 mg by mouth daily.   B Complex-C-Folic Acid (SUPER B COMPLEX/FA/VIT C) TABS Take by mouth.   cetirizine (ZYRTEC) 10 MG tablet Take 10 mg by mouth daily.   Cholecalciferol (VITAMIN D-3) 1000 UNITS CAPS Take 2,000 Units by mouth daily.   doxylamine, Sleep, (UNISOM) 25 MG tablet Take 25 mg by mouth at bedtime.   escitalopram (LEXAPRO) 5 MG tablet Take 1 tablet (5 mg total) by mouth daily.   famotidine (PEPCID) 20 MG tablet Take 1 tablet (20 mg total) by mouth daily.   fluticasone (FLONASE) 50 MCG/ACT nasal spray Place 2 sprays into both nostrils daily.   levalbuterol (XOPENEX) 0.63 MG/3ML nebulizer solution Take 3 mLs (0.63 mg total) by nebulization every 6 (six) hours as needed for wheezing or shortness of breath.   metoprolol tartrate (LOPRESSOR) 25 MG tablet Take 1 tablet (25 mg total) by mouth in the morning.   metoprolol tartrate (LOPRESSOR) 25 MG tablet Take 12.5 mg by mouth at bedtime. Hold if SBP< 90   Multiple Vitamins-Minerals (PRESERVISION AREDS 2) CAPS Take 1 mg by mouth in the morning and at bedtime.   nitroGLYCERIN (NITROSTAT) 0.4 MG SL tablet Place 0.4 mg under the tongue every 5 (five) minutes as needed for chest pain.   OXYGEN Inhale 3 L into the lungs as directed. Every Shift; Shift 1, Shift 2, Shift 3   potassium chloride (KLOR-CON M) 10 MEQ tablet Take 10 mEq by mouth daily.   pravastatin  (PRAVACHOL) 40 MG tablet Take 40 mg daily by mouth.    Probiotic Product (ALIGN) 4 MG CAPS Take 4 mg by mouth daily. 1 capsule oral once a day   torsemide (DEMADEX) 20 MG tablet Take 1 tablet (20 mg total) by mouth daily. 1 tablet once a morning   No facility-administered encounter medications on file as of 10/31/2023.    Review of Systems:  Review of Systems  Constitutional:  Positive for activity change and appetite change.  HENT: Negative.    Respiratory:  Positive for cough and shortness of breath.   Cardiovascular:  Negative for leg swelling.  Gastrointestinal:  Negative for constipation.  Genitourinary: Negative.  Musculoskeletal:  Positive for gait problem. Negative for arthralgias and myalgias.  Skin: Negative.   Neurological:  Positive for weakness. Negative for dizziness.  Psychiatric/Behavioral:  Negative for confusion, dysphoric mood and sleep disturbance.     Health Maintenance  Topic Date Due   Zoster Vaccines- Shingrix (1 of 2) 11/23/1945   INFLUENZA VACCINE  07/20/2023   COVID-19 Vaccine (4 - 2023-24 season) 08/20/2023   Medicare Annual Wellness (AWV)  05/24/2024   DTaP/Tdap/Td (3 - Tdap) 04/11/2028   Pneumonia Vaccine 26+ Years old  Completed   DEXA SCAN  Completed   HPV VACCINES  Aged Out   Colonoscopy  Discontinued    Physical Exam: Vitals:   10/31/23 1056  BP: 108/64  Pulse: 99  Resp: 17  Temp: 98 F (36.7 C)  TempSrc: Temporal  SpO2: 99%  Weight: 118 lb 9.6 oz (53.8 kg)  Height: 5\' 5"  (1.651 m)   Body mass index is 19.74 kg/m. Physical Exam Vitals reviewed.  Constitutional:      Appearance: Normal appearance.  HENT:     Head: Normocephalic.     Nose: Nose normal.     Mouth/Throat:     Mouth: Mucous membranes are moist.     Pharynx: Oropharynx is clear.  Eyes:     Pupils: Pupils are equal, round, and reactive to light.  Cardiovascular:     Rate and Rhythm: Normal rate and regular rhythm.     Pulses: Normal pulses.     Heart sounds:  Murmur heard.  Pulmonary:     Effort: Pulmonary effort is normal.     Breath sounds: Normal breath sounds.  Abdominal:     General: Abdomen is flat. Bowel sounds are normal.     Palpations: Abdomen is soft.  Musculoskeletal:        General: No swelling.     Cervical back: Neck supple.  Skin:    General: Skin is warm.  Neurological:     General: No focal deficit present.     Mental Status: She is alert and oriented to person, place, and time.  Psychiatric:        Mood and Affect: Mood normal.        Thought Content: Thought content normal.     Labs reviewed: Basic Metabolic Panel: Recent Labs    12/22/22 1558 01/27/23 1044 08/11/23 1307 08/25/23 0000 08/31/23 0000  NA 129* 133* 143 139 141  K 4.6 4.3 4.1 5.4* 3.9  CL 89* 90* 94* 94* 88*  CO2 24 30* 37* 31* 45*  GLUCOSE 98 109* 99  --   --   BUN 10 10 17 17  22*  CREATININE 0.70 0.72 0.70 0.7 0.8  CALCIUM 9.3 9.5 9.6 9.7 9.3  TSH  --   --   --  2.40  --    Liver Function Tests: Recent Labs    08/25/23 0000 08/30/23 1611  AST 32  --   ALT 14  --   ALKPHOS 61  --   PROT  --  6.6  ALBUMIN 3.7  --    No results for input(s): "LIPASE", "AMYLASE" in the last 8760 hours. No results for input(s): "AMMONIA" in the last 8760 hours. CBC: Recent Labs    12/22/22 1558 08/25/23 0000  WBC 6.6 4.8  NEUTROABS 4.7  --   HGB 13.1 10.2*  HCT 39.6 31*  MCV 87  --   PLT 238 171   Lipid Panel: No results for input(s): "CHOL", "HDL", "LDLCALC", "TRIG", "  CHOLHDL", "LDLDIRECT" in the last 8760 hours. Lab Results  Component Value Date   HGBA1C 5.6 12/25/2017    Procedures since last visit: No results found.  Assessment/Plan 1. Chronic respiratory failure with hypoxia (HCC) Doing well with Oxygen Keep POX around 90%  2. Severe pulmonary hypertension (HCC) Per Cardiology Diuresis PRN  3. Chronic diastolic congestive heart failure (HCC) On Torsemide  Doing well  4. Generalized anxiety disorder Lexapro helping  No Change in dose right now  5. Non-small cell cancer of right lung (HCC) S/p Radiation   6. Chronic pleural effusion Right s/p Thoracocentesis Left one iss Chronic  7. Valvular heart disease Not candidate for intervention  8. Weight loss Poor Appetite Add Supplements Discussed with patient  Will Monitor in [place]  Get Flu and Covid Shot And RSV  Labs/tests ordered:  CBC,CMP,Lipid Next appt:  Visit date not found

## 2023-11-01 DIAGNOSIS — R278 Other lack of coordination: Secondary | ICD-10-CM | POA: Diagnosis not present

## 2023-11-01 DIAGNOSIS — M6389 Disorders of muscle in diseases classified elsewhere, multiple sites: Secondary | ICD-10-CM | POA: Diagnosis not present

## 2023-11-01 DIAGNOSIS — Z23 Encounter for immunization: Secondary | ICD-10-CM | POA: Diagnosis not present

## 2023-11-01 DIAGNOSIS — J9601 Acute respiratory failure with hypoxia: Secondary | ICD-10-CM | POA: Diagnosis not present

## 2023-11-06 DIAGNOSIS — H353211 Exudative age-related macular degeneration, right eye, with active choroidal neovascularization: Secondary | ICD-10-CM | POA: Diagnosis not present

## 2023-11-06 DIAGNOSIS — J9601 Acute respiratory failure with hypoxia: Secondary | ICD-10-CM | POA: Diagnosis not present

## 2023-11-06 DIAGNOSIS — R278 Other lack of coordination: Secondary | ICD-10-CM | POA: Diagnosis not present

## 2023-11-06 DIAGNOSIS — M6389 Disorders of muscle in diseases classified elsewhere, multiple sites: Secondary | ICD-10-CM | POA: Diagnosis not present

## 2023-11-06 DIAGNOSIS — H353122 Nonexudative age-related macular degeneration, left eye, intermediate dry stage: Secondary | ICD-10-CM | POA: Diagnosis not present

## 2023-11-06 DIAGNOSIS — H43813 Vitreous degeneration, bilateral: Secondary | ICD-10-CM | POA: Diagnosis not present

## 2023-11-08 DIAGNOSIS — M6389 Disorders of muscle in diseases classified elsewhere, multiple sites: Secondary | ICD-10-CM | POA: Diagnosis not present

## 2023-11-08 DIAGNOSIS — R278 Other lack of coordination: Secondary | ICD-10-CM | POA: Diagnosis not present

## 2023-11-08 DIAGNOSIS — J9601 Acute respiratory failure with hypoxia: Secondary | ICD-10-CM | POA: Diagnosis not present

## 2023-11-10 DIAGNOSIS — M6389 Disorders of muscle in diseases classified elsewhere, multiple sites: Secondary | ICD-10-CM | POA: Diagnosis not present

## 2023-11-10 DIAGNOSIS — R278 Other lack of coordination: Secondary | ICD-10-CM | POA: Diagnosis not present

## 2023-11-10 DIAGNOSIS — J9601 Acute respiratory failure with hypoxia: Secondary | ICD-10-CM | POA: Diagnosis not present

## 2023-11-29 NOTE — Progress Notes (Signed)
Cardiology Office Note:    Date:  12/04/2023   ID:  [name] French, DOB 1926/01/27, MRN [id number]  PCP:  Mahlon Gammon, MD   [facility] Providers Cardiologist:  Ishika Chesterfield [place], MD      Referring MD: Gaspar Garbe, MD    History of Present Illness:    [name] French is a 89 y.o. female seen for evaluation of increased SOB. She has a hx of secondary hypertension, coronary artery calcification, and tachycardia.  Her PMH also includes lung cancer with radiation therapy.  She was not felt to be a candidate for chemotherapy or surgery.  She was admitted overnight 12/25/2017 for evaluation of atypical chest pain.  Her CT chest was negative for PE.  She was noted to have left pleural effusion.  Her troponins and EKG were unremarkable.  Her echocardiogram was normal.  She was noted to have PACs and there was mention of a run of NSVT.  She was started on metoprolol.  Her chest pain was felt to be esophageal in nature.  She was seen by GI 01/25/2018 for evaluation of abnormal colon finding on her PET scan.  Follow-up evaluation was never completed.     She underwent nuclear stress test 4/19 for chest pain.  Her exam was unremarkable.  Subsequent CT scans showed left lung collapse with a large left effusion.   She was admitted to the hospital 6/21 with respiratory failure and severe hyponatremia.  Her hyponatremia improved with tolvaptan.  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  She is placed on fluid restriction.  She was noted to have bilateral pleural effusions.  She underwent thoracentesis which produced transudative fluid.  An echocardiogram at that time showed moderate pulmonary hypertension, normal LV function, normal RV function.  She was transferred to inpatient rehab for 2 weeks after.    She presented to the emergency department on 07/26/2021 and was discharged on 07/27/2021 with complaints of chest discomfort.  Her troponins were 5 and 5, her chest pain resolved,  her EKG showed sinus rhythm 99 bpm.  Cardiology signed off without further plans for ischemic evaluation she was discharged in stable condition.   She contacted the nurse triage line on 08/16/2021 and reported lower extremity swelling and increased shortness of breath.   She presented to the clinic 08/19/21 for evaluation and stated she had noticed increased work of breathing and lower extremity swelling x1 month. She was placed on lasix 40 mg daily. Her lab work remained stable on 08/24/2021 and her weight log showed significant improvement. Repeat Echo in Sept 2022 showed normal LV function. Mild to mod MR, moderate AI, moderate Pulmonary HTN. On last follow up lasix was stopped.  CT chest in August was unchanged with chronic complete consolidation of the LLL and left pleural effusion.   She has chronic SOB. When seen in Jan 2023 she notes marked increase in SOB with exertion. OK when she is at rest but with any activity she is SOB and has marked fatigue. No cough. No chest pain. Some swelling in right ankle. Weight has gone up 3 lbs this week.   CXR was stable. CT showed no acute PE. Suggested increase pulmonary edema and right effusion with chronic changes on the left. BNP was elevated at 538. She was started on lasix.   In September she developed a new right pleural effusion. Underwent thoracentesis. 1.1 L. Was seen by Dr Elwyn Lade in Advanced heart failure given pulmonary  HTN. Lasix was reduced. Right heart cath of limited benefit and patient did not want further interventions done.   On follow up today she is doing OK. Gets SOB if she is very active. On oxygen. No ankle swelling and weight is stable. She is happy about the lower diuretic dose. Now on Lexapro and feels like she is much less anxious   Past Medical History:  Diagnosis Date   Allergic rhinitis    Anemia    during pregnancy   Arthritis    Breast cancer (HCC) 05/08/13   right upper inner, invasive mammary   Breast cancer, right breast  (HCC) 04/16/2013   Underwent lumpectomy on 11/06/13. 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   Use of letrozole (Femara)    neoadjuvant antiestrogen therapy with letrozole 2.5 mg daily x 7 monhts    Past Surgical History:  Procedure Laterality Date   BREAST BIOPSY Left 1960   lt br bx/benign   BREAST LUMPECTOMY Right    BREAST LUMPECTOMY Right 02/10/2020   Procedure: RIGHT BREAST LUMPECTOMY;  Surgeon: Abigail Miyamoto, MD;  Location: [facility] OR;  Service: General;  Laterality: Right;   BREAST LUMPECTOMY WITH NEEDLE LOCALIZATION Bilateral 11/06/2013   Procedure: BREAST LUMPECTOMY WITH NEEDLE LOCALIZATION;  Surgeon: Currie Paris, MD;  Location: [facility];  Service: General;  Laterality: Bilateral;   COLONOSCOPY     EYE SURGERY Bilateral    both cataracts   MOHS SURGERY Right    nose basal/squamous   THORACENTESIS N/A 08/30/2023   Procedure: THORACENTESIS;  Surgeon: Luciano Cutter, MD;  Location: [facility] ENDOSCOPY;  Service: Pulmonary;  Laterality: N/A;   TOTAL HIP ARTHROPLASTY Left 08/31/2018   Procedure: LEFT TOTAL HIP ARTHROPLASTY ANTERIOR APPROACH;  Surgeon: Kathryne Hitch, MD;  Location: [facility] ORS;  Service: Orthopedics;  Laterality: Left;   VIDEO BRONCHOSCOPY WITH ENDOBRONCHIAL NAVIGATION N/A 11/01/2017   Procedure: VIDEO BRONCHOSCOPY WITH ENDOBRONCHIAL NAVIGATION;  Surgeon: Loreli Slot, MD;  Location: [facility] OR;  Service: Thoracic;  Laterality: N/A;   WRIST SURGERY  1990   lt    Current Medications: Current Meds  Medication Sig    aspirin 81 MG tablet Take 81 mg by mouth daily.   B Complex-C-Folic Acid (SUPER B COMPLEX/FA/VIT C) TABS Take by mouth.   cetirizine (ZYRTEC) 10 MG tablet Take 10 mg by mouth daily.   Cholecalciferol (VITAMIN D-3) 1000 UNITS CAPS Take 2,000 Units by mouth daily.   doxylamine, Sleep, (UNISOM) 25 MG tablet Take 25 mg by mouth at bedtime.   escitalopram (LEXAPRO) 5 MG tablet Take 1 tablet (5 mg total) by mouth daily.   famotidine (PEPCID) 20 MG tablet Take 1 tablet (20 mg total) by mouth daily.   fluticasone (FLONASE) 50 MCG/ACT nasal spray Place 2 sprays into both nostrils daily.   levalbuterol (XOPENEX) 0.63 MG/3ML nebulizer solution Take 3 mLs (0.63 mg total) by nebulization  every 6 (six) hours as needed for wheezing or shortness of breath.   metoprolol tartrate (LOPRESSOR) 25 MG tablet Take 1 tablet (25 mg total) by mouth in the morning.   metoprolol tartrate (LOPRESSOR) 25 MG tablet Take 12.5 mg by mouth at bedtime. Hold if SBP< 90   Multiple Vitamins-Minerals (PRESERVISION AREDS 2) CAPS Take 1 mg by mouth in the morning and at bedtime.   nitroGLYCERIN (NITROSTAT) 0.4 MG SL tablet Place 0.4 mg under the tongue every 5 (five) minutes as needed for chest pain.   OXYGEN Inhale 3 L into the lungs as directed. Every Shift; Shift 1, Shift 2, Shift 3   potassium chloride (KLOR-CON M) 10 MEQ tablet Take 10 mEq by mouth daily.   pravastatin (PRAVACHOL) 40 MG tablet Take 40 mg daily by mouth.    Probiotic Product (ALIGN) 4 MG CAPS Take 4 mg by mouth daily. 1 capsule oral once a day   torsemide (DEMADEX) 20 MG tablet Take 1 tablet (20 mg total) by mouth daily. 1 tablet once a morning     Allergies:   Other, Codeine, Hydrocodone, and Neosporin [neomycin-bacitracin zn-polymyx]   Social History   Socioeconomic History   Marital status: Divorced    Spouse name: Not on file   Number of children: 2   Years of education: Not on file   Highest education level: Not on file  Occupational History    Occupation: Retired  Tobacco Use   Smoking status: Former    Current packs/day: 0.00    Average packs/day: 0.5 packs/day for 25.0 years (12.5 ttl pk-yrs)    Types: Cigarettes    Start date: 12/19/1953    Quit date: 12/19/1978    Years since quitting: 44.9   Smokeless tobacco: Never  Vaping Use   Vaping status: Never Used  Substance and Sexual Activity   Alcohol use: Yes    Comment: "wine usually, no taste in the last in past few weeks- (02/07/2020)   Drug use: No   Sexual activity: Not Currently    Comment: menarche at age14, menopause in 65, HRT x 36, age at first live birth 11's, G52 P3  Other Topics Concern   Not on file  Social History Narrative   Not on file   Social Drivers of Health   Financial Resource Strain: Not on file  Food Insecurity: Not on file  Transportation Needs: Not on file  Physical Activity: Not on file  Stress: Not on file  Social Connections: Not on file     Family History: The patient's family history includes Heart disease in her mother; Lung cancer in her sister; Prostate cancer in her father.  ROS:   Please see the history of present illness.     All other systems reviewed and are negative.   Risk Assessment/Calculations:           Physical Exam:    VS:  BP 108/68 (BP Location: Left Arm, Patient Position: Sitting, Cuff Size: Normal)   Pulse 100   Ht 5\' 5"  (1.651 m)   Wt 119 lb (54 kg)   SpO2 99%   BMI 19.80 kg/m     Wt Readings from Last 3 Encounters:  12/04/23 119 lb (54 kg)  10/31/23 118 lb 9.6 oz (53.8 kg)  10/11/23 116 lb 12.8 oz (53 kg)     GEN:  Well nourished, well developed in no acute distress HEENT: Normal NECK: No JVD; No carotid bruits LYMPHATICS: No lymphadenopathy CARDIAC: Tachycardic, no murmurs, rubs,  gallops RESPIRATORY:  decreased BS throughout left lower lung fields. Otherwise clear.  ABDOMEN: Soft, non-tender, non-distended MUSCULOSKELETAL: no edema; No deformity  SKIN: Warm and dry NEUROLOGIC:  Alert and  oriented x 3 PSYCHIATRIC:  Normal affect    EKGs/Labs/Other Studies Reviewed:    The following studies were reviewed today: Nuclear stress test 03/23/2018 Nuclear stress EF: 56%. Clinically and electrically negative for ischemia Normal perfusiion No ischemia or scar This is a low risk study.       Echocardiogram 05/29/2020 IMPRESSIONS     1. Left ventricular ejection fraction, by estimation, is 60 to 65%. The  left ventricle has normal function. The left ventricle has no regional  wall motion abnormalities. Left ventricular diastolic parameters are  consistent with Grade II diastolic  dysfunction (pseudonormalization).   2. Right ventricular systolic function is normal. The right ventricular  size is mildly enlarged. There is moderately elevated pulmonary artery  systolic pressure. The estimated right ventricular systolic pressure is  51.0 mmHg.   3. Right atrial size was moderately dilated.   4. The mitral valve is normal in structure. Mild mitral valve  regurgitation. No evidence of mitral stenosis.   5. Tricuspid valve regurgitation is moderate.   6. The aortic valve is normal in structure. Aortic valve regurgitation is  mild. No aortic stenosis is present.   7. The inferior vena cava is normal in size with greater than 50%  respiratory variability, suggesting right atrial pressure of 3 mmHg.  Echo 09/09/21: IMPRESSIONS     1. Left ventricular ejection fraction, by estimation, is 65 to 70%. The  left ventricle has normal function. The left ventricle has no regional  wall motion abnormalities. Left ventricular diastolic parameters are  indeterminate. There is the  interventricular septum is flattened in systole, consistent with right  ventricular pressure overload.   2. Right ventricular systolic function is mildly reduced. The right  ventricular size is mildly enlarged. There is moderately elevated  pulmonary artery systolic pressure. The estimated right ventricular   systolic pressure is 48.4 mmHg.   3. Right atrial size was mildly dilated.   4. The mitral valve is normal in structure. Mild to moderate mitral valve  regurgitation. No evidence of mitral stenosis.   5. The aortic valve is grossly normal. Aortic valve regurgitation is  moderate. Mild aortic valve sclerosis is present, with no evidence of  aortic valve stenosis.   6. The inferior vena cava is normal in size with greater than 50%  respiratory variability, suggesting right atrial pressure of 3 mmHg.   Comparison(s): 05/29/20 60-65%, valve disease similar to slightly  progressed on current study without severe disease.   IMPRESSION: 1. Stable CT of the chest. Unchanged post treatment appearance of the left chest with dense, essentially complete consolidation of the left lower lobe with volume loss of the left hemithorax. 2. Unchanged appearance of loculated left pleural effusion 3. Stable small pulmonary nodules, likely benign and incidental. 4. 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 5. Aortic Atherosclerosis (ICD10-I70.0).     Electronically Signed   By: Signa Kell M.D.   On: 08/02/2022 16:43   CHEST - 2 VIEW   COMPARISON:  07/26/2021 and chest CT 08/02/2022   FINDINGS: Chronic volume loss in the left lower lobe and evidence for a persistent left pleural effusion. Focal densities in the right hilar  region are unchanged. Heart size is grossly stable. Prominent lung markings are similar to the prior chest CT topogram. No acute bone abnormality.   IMPRESSION: 1. Chronic volume loss in the left lower lobe with persistent left pleural effusion. 2. Prominent lung markings are similar to the prior chest CT topogram. No acute findings.    CT ANGIOGRAPHY CHEST WITH CONTRAST   TECHNIQUE: Multidetector  CT imaging of the chest was performed using the standard protocol during bolus administration of intravenous contrast. Multiplanar CT image reconstructions and MIPs were obtained to evaluate the vascular anatomy.   RADIATION DOSE REDUCTION: This exam was performed according to the departmental dose-optimization program which includes automated exposure control, adjustment of the mA and/or kV according to patient size and/or use of iterative reconstruction technique.   CONTRAST:  80mL OMNIPAQUE IOHEXOL 350 MG/ML SOLN   COMPARISON:  CT 08/02/2022.   FINDINGS: Cardiovascular: Satisfactory opacification of the pulmonary arteries to the segmental level. Chronic thrombus in the left lower lobe artery with reduced, now non opacification of the distal segmental vessels. No evidence of acute pulmonary embolism. Mild atherosclerosis of the thoracic aorta. Dilated right atrium and right ventricle, unchanged from prior exam. Reflux of contrast into the IVC and hepatic veins.   Mediastinum/Nodes: Mildly prominent precarinal lymph node, similar to prior. No new lymphadenopathy. Atrophic thyroid. Tiny hiatal hernia.   Lungs/Pleura: Unchanged left upper lobe perihilar masslike architectural distortion in fibrosis. Chronic left lower lobe consolidation. Chronic loculated left pleural effusion with adjacent pleural thickening, not significantly changed from prior. There is a new small right pleural effusion.   There is interlobular septal thickening and ground-glass opacities bilaterally.   Unchanged nodular pleural thickening in the peripheral left apex (series 5, image 45).   Unchanged soft tissue thickening along the minor fissure of the right lung, measuring up to 8 mm in thickness (sagittal image 40).   Subpleural nodule in the anterior right middle lobe measures 5 mm, previously 5 mm (series 6, image 81).   Subpleural nodule in the posterior right lower lobe is obscured by the new  right pleural effusion.   Upper Abdomen: Cholelithiasis. Unchanged 1.3 x 0.9 cm left adrenal nodule (series 5, image 286).   Musculoskeletal: No acute osseous abnormality. No suspicious osseous lesion.   Review of the MIP images confirms the above findings.   IMPRESSION: Progressed thrombus in situ associated with post-radiation change in the left lower lobe pulmonary artery, with unchanged chronic left lower lobe consolidation/volume loss and adjacent moderate-sized chronic loculated left pleural effusion.   No evidence of acute pulmonary embolism. Mild to moderate pulmonary edema with small right pleural effusion, new from prior exam.   Stable nodular thickening in the left apex and right minor fissure. Stable anterior right middle lobe pulmonary nodule. Previously noted posterior right lower lobe subpleural nodule is obscured by the new right pleural effusion.     Electronically Signed   By: Caprice Renshaw M.D.   On: 12/29/2022 15:05      Result History  Echo 08/01/23: IMPRESSIONS     1. Left ventricular ejection fraction, by estimation, is 60 to 65%. The  left ventricle has normal function. The left ventricle has no regional  wall motion abnormalities. Left ventricular diastolic parameters are  indeterminate. There is the  interventricular septum is flattened in systole and diastole, consistent  with right ventricular pressure and volume overload.   2. Right ventricular systolic function is normal. The right ventricular  size is moderately enlarged. There is  severely elevated pulmonary artery  systolic pressure. The estimated right ventricular systolic pressure is  62.9 mmHg.   3. Right atrial size was moderately dilated.   4. The mitral valve is normal in structure. Moderate to severe mitral  valve regurgitation. No evidence of mitral stenosis.   5. Tricuspid valve regurgitation is severe.   6. The aortic valve is normal in structure. There is mild calcification  of  the aortic valve. There is mild thickening of the aortic valve. Aortic  valve regurgitation is mild. Aortic valve sclerosis is present, with no  evidence of aortic valve stenosis.   7. The inferior vena cava is dilated in size with <50% respiratory  variability, suggesting right atrial pressure of 15 mmHg.   Comparison(s): Prior images reviewed side by side.    EKG: is not done today.   Recent Labs: 12/22/2022: BNP 538.7 08/25/2023: ALT 14; Hemoglobin 10.2; Platelets 171; TSH 2.40 09/11/2023: BUN 19; Creatinine 0.7; Potassium 3.9; Sodium 143  Recent Lipid Panel    Component Value Date/Time   CHOL 158 12/26/2017 0025   TRIG 167 (H) 12/26/2017 0025   HDL 51 12/26/2017 0025   CHOLHDL 3.1 12/26/2017 0025   VLDL 33 12/26/2017 0025   LDLCALC 74 12/26/2017 0025   Dated 04/15/21: cholesterol 174, triglycerides 71, HDL 71, LDL 89.  Dated 05/02/22: cholesterol 171, triglycerides 76, HDL 70, LDL 86, LFTs normal.   ASSESSMENT & PLAN    SOB-there does appear to be a component of diastolic CHF as well as pulmonary HTN.  Continue Heart healthy low-sodium diet Echocardiogram as noted above with normal LV function. Continue lasix 20 mg daily Appears euvolemic today   2. Essential hypertension- Well-controlled  Continue metoprolol   3. Sinus tachycardia- chronic  Continue metoprolol to 25 mg a.m. and continue 12.5 mg in the p.m.   4. Coronary calcium-no chest pain  Nuclear stress test showed normal perfusion. Continue aspirin, metoprolol, nitroglycerin, pravastatin   5. Lung cancer-status postradiation therapy.  Follow-up CT showed stable findings.  No PE.  Noted to have chronic left pleural effusion and LLL consolidation. Now with  new Right effusion s/p thoracentesis. Symptomatically doing well   6. Breast cancer-completed chemotherapy.  There is a recurrent breast CA on the right. Follows with oncology  7. Pulmonary HTN.        Disposition: Follow-up 4 months       Medication  Adjustments/Labs and Tests Ordered: Current medicines are reviewed at length with the patient today.  Concerns regarding medicines are outlined above.  No orders of the defined types were placed in this encounter.  No orders of the defined types were placed in this encounter.   There are no Patient Instructions on file for this visit.   Signed, Mali Eppard [place], MD  12/04/2023 4:27 PM

## 2023-12-04 ENCOUNTER — Ambulatory Visit: Payer: Medicare Other | Attending: Cardiology | Admitting: Cardiology

## 2023-12-04 ENCOUNTER — Encounter: Payer: Self-pay | Admitting: Cardiology

## 2023-12-04 VITALS — BP 108/68 | HR 100 | Ht 65.0 in | Wt 119.0 lb

## 2023-12-04 DIAGNOSIS — R0609 Other forms of dyspnea: Secondary | ICD-10-CM | POA: Diagnosis present

## 2023-12-04 DIAGNOSIS — I272 Pulmonary hypertension, unspecified: Secondary | ICD-10-CM | POA: Diagnosis not present

## 2023-12-04 DIAGNOSIS — I5032 Chronic diastolic (congestive) heart failure: Secondary | ICD-10-CM | POA: Diagnosis present

## 2023-12-04 DIAGNOSIS — C3491 Malignant neoplasm of unspecified part of right bronchus or lung: Secondary | ICD-10-CM | POA: Diagnosis not present

## 2023-12-04 NOTE — Patient Instructions (Signed)
Medication Instructions:  Continue same medications *If you need a refill on your cardiac medications before your next appointment, please call your pharmacy*   Lab Work: None ordered   Testing/Procedures: None ordered   Follow-Up: At [facility], you and your health needs are our priority.  As part of our continuing mission to provide you with exceptional heart care, we have created designated Provider Care Teams.  These Care Teams include your primary Cardiologist (physician) and Advanced Practice Providers (APPs -  Physician Assistants and Nurse Practitioners) who all work together to provide you with the care you need, when you need it.  We recommend signing up for the patient portal called "MyChart".  Sign up information is provided on this After Visit Summary.  MyChart is used to connect with patients for Virtual Visits (Telemedicine).  Patients are able to view lab/test results, encounter notes, upcoming appointments, etc.  Non-urgent messages can be sent to your provider as well.   To learn more about what you can do with MyChart, go to [email or web address].    Your next appointment:  6 months   Call in Feb to schedule June appointment     Provider:  Dr.Jordan

## 2024-01-03 ENCOUNTER — Encounter (HOSPITAL_BASED_OUTPATIENT_CLINIC_OR_DEPARTMENT_OTHER): Payer: Self-pay | Admitting: Pulmonary Disease

## 2024-01-03 ENCOUNTER — Ambulatory Visit (HOSPITAL_BASED_OUTPATIENT_CLINIC_OR_DEPARTMENT_OTHER): Payer: Medicare Other | Admitting: Pulmonary Disease

## 2024-01-03 ENCOUNTER — Ambulatory Visit (HOSPITAL_BASED_OUTPATIENT_CLINIC_OR_DEPARTMENT_OTHER): Payer: Medicare Other

## 2024-01-03 VITALS — BP 96/54 | HR 107 | Resp 20 | Ht 65.0 in | Wt 120.0 lb

## 2024-01-03 DIAGNOSIS — J90 Pleural effusion, not elsewhere classified: Secondary | ICD-10-CM

## 2024-01-03 DIAGNOSIS — I2721 Secondary pulmonary arterial hypertension: Secondary | ICD-10-CM | POA: Diagnosis not present

## 2024-01-03 DIAGNOSIS — J9811 Atelectasis: Secondary | ICD-10-CM | POA: Diagnosis not present

## 2024-01-03 DIAGNOSIS — J9611 Chronic respiratory failure with hypoxia: Secondary | ICD-10-CM

## 2024-01-03 NOTE — Patient Instructions (Signed)
 Chronic hypoxemic respiratory failure Dyspnea on exertion Chronic pleural effusion Muscular deconditioning --CONTINUE 2L oxygen  with activity and sleep for goal >90% --Plan for ambulatory O2 in June/July 2025  Right pleural effusion s/p thora on 08/30/23 - resolved. No intervention required. Chronic left pleural effusion - stable. --ORDER CXR for today --Reviewed with resolved right pleural effusion Stable left pleural effusion

## 2024-01-03 NOTE — Progress Notes (Signed)
 Subjective:   PATIENT ID: [name] French GENDER: female DOB: 07-02-1926, MRN: [id number]  Chief Complaint  Patient presents with   Follow-up    Breathing is ok except when she gets upset or in a hurry    Reason for Visit: F/u shortness of breath  Ms. [name] French is a 89 year old female former smoker with right breast cancer with relapse 2021 s/p lumpectomy and chemotherapy, right NSCLC s/p radiation in 2018, chronic left pleural effusion. HTN, HLD who presents for follow-up shortness of breath.  Initial consult She reports no symptom at rest however as significant shortness of breath with moderate distances. Uses a walker at [facility]. Has associated with wheezing. Has post nasal drip associated with cough. She has been seen by Cardiology in Feb 2024 and was recommended to take lasix  20 mg daily but she takes it 3-4 days out of the week and only will restart when she has lower extremity swelling R>L. She has a chronic pleural effusion related to her lung cancer that has been present for >4 years but it has not previously debilitated. She reports progressively worsening shortness of breath in th last six months. Previously was participating water  aerobics last winter. Now not doing any activity. Denies illness prior to this.  09/14/23 Since our last visit thoracentesis on 9/11 with 1.1L removed. She has been diuresed and feeling dry. She was seen by Dr. Alease French in Cardiology/Heart Failure clinic on 09/13/23 and torsemide  was reduced to 20 mg daily. Denies shortness of breath, chest pain, leg swelling.  01/03/24 Since our last visit she is overall doing well. Compliant with her 2L. She is short of breath when busy, in a hurry or carrying things but does find at rest. Compliant with her torsemide  and tolerating without issue.  Social History: Quit smoking 44 years ago.   Past Medical History:  Diagnosis Date   Allergic rhinitis    Anemia    during pregnancy   Arthritis    Breast  cancer (HCC) 05/08/13   right upper inner, invasive mammary   Breast cancer, right breast (HCC) 04/16/2013   Underwent lumpectomy on 11/06/13. 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   Use of letrozole  (Femara )    neoadjuvant antiestrogen therapy with letrozole  2.5 mg daily x 7 monhts     Family History  Problem Relation Age of Onset   Heart disease Mother    Prostate cancer Father    Lung cancer Sister      Social History   Occupational History   Occupation: Retired  Tobacco Use   Smoking status: Former    Current packs/day: 0.00    Average packs/day: 0.5 packs/day for 25.0 years (12.5 ttl pk-yrs)    Types: Cigarettes    Start date: 12/19/1953    Quit date: 12/19/1978    Years since quitting: 45.0   Smokeless tobacco: Never  Vaping Use   Vaping status: Never Used  Substance and Sexual Activity   Alcohol  use: Yes    Comment: "wine usually, no taste in the last in past few weeks- (02/07/2020)   Drug use: No   Sexual activity: Not Currently    Comment: menarche at age14, menopause in 5, HRT x 89, age at first live birth 88's, G67 P3    Allergies  Allergen Reactions   Other Anaphylaxis    Lobster- "years ago"   Codeine Itching    Insomnia    Hydrocodone  Itching and Other (See Comments)    Insomnia   Neosporin [Neomycin-Bacitracin Zn-Polymyx] Rash     Outpatient Medications Prior to Visit  Medication Sig Dispense Refill   aspirin  81 MG tablet Take 81 mg by mouth daily.     B Complex-C-Folic Acid (SUPER B COMPLEX/FA/VIT C) TABS Take by mouth.      cetirizine (ZYRTEC) 10 MG tablet Take 10 mg by mouth daily.     Cholecalciferol  (VITAMIN D -3) 1000 UNITS CAPS Take 2,000 Units by mouth daily.     doxylamine , Sleep, (UNISOM ) 25 MG tablet Take 25 mg by mouth at bedtime.     escitalopram  (LEXAPRO ) 5 MG tablet Take 1 tablet (5 mg total) by mouth daily.     famotidine  (PEPCID ) 20 MG tablet Take 1 tablet (20 mg total) by mouth daily.     fluticasone  (FLONASE ) 50 MCG/ACT nasal spray Place 2 sprays into both nostrils daily. 16 g 6   levalbuterol  (XOPENEX ) 0.63 MG/3ML nebulizer solution Take 3 mLs (0.63 mg total) by nebulization every 6 (six) hours as needed for wheezing or shortness of breath.     metoprolol  tartrate (LOPRESSOR ) 25 MG tablet Take 1 tablet (25 mg total) by mouth in the morning.     metoprolol  tartrate (LOPRESSOR ) 25 MG tablet Take 12.5 mg by mouth at bedtime. Hold if SBP< 90     Multiple Vitamins-Minerals (PRESERVISION AREDS 2) CAPS Take 1 mg by mouth in the morning and at bedtime.     nitroGLYCERIN  (NITROSTAT ) 0.4 MG SL tablet Place 0.4 mg under the tongue every 5 (five) minutes as needed for chest pain.     OXYGEN  Inhale 3 L into the lungs as directed. Every Shift; Shift 1, Shift 2, Shift 3     potassium chloride  (KLOR-CON  M) 10 MEQ tablet Take 10 mEq by mouth daily.     pravastatin  (PRAVACHOL ) 40 MG tablet Take 40 mg daily by mouth.      Probiotic Product (ALIGN) 4 MG CAPS Take 4 mg by mouth daily. 1 capsule oral once a day     torsemide  (DEMADEX ) 20 MG tablet Take 1 tablet (20 mg total) by mouth daily. 1 tablet once a morning     No facility-administered medications prior to visit.    Review of Systems  Constitutional:  Negative for chills, diaphoresis, fever, malaise/fatigue and weight loss.  HENT:  Negative for congestion.   Respiratory:  Positive for shortness of breath. Negative for cough, hemoptysis, sputum production and wheezing.   Cardiovascular:  Negative for chest pain, palpitations and leg swelling.     Objective:    Vitals:   01/03/24 1059  BP: (!) 96/54  Pulse: (!) 107  Resp: 20  SpO2: 98%  Weight: 120 lb (54.4 kg)  Height: 5\' 5"  (1.651 m)   SpO2: 98 %  Physical Exam: General: Thin, elderly-appearing, no acute distress HENT: [place], AT Eyes: EOMI, no scleral icterus Respiratory: Diminished left > right  bases. Remaining clear to auscultation bilaterally.  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 No PE. Dense LLL consolidation. Unchanged loculated left pleural effusion. Chronic since 2019 in setting of lung cancer CTA 08/29/23 - chronic LLL pulmonary artery occlusion, no new PE. Moderate right pleural effusion with compressive atelectasis. Complete LLL collapse with chronic left pleural effusion, unchanged. CXR 09/14/23 - Chronic loculated left effusion. Very small recurrence of right pleural effusion CXR 01/03/24 - on my read, unchanged chronic loculated effusion/lung collapse. No effusion on right side  PFT: None on file  Labs: CBC    Component Value Date/Time   WBC 4.8 08/25/2023 0000   WBC 10.1 07/26/2021 1026   RBC 3.66 (A) 08/25/2023 0000   HGB 10.2 (A) 08/25/2023 0000   HGB 13.1 12/22/2022 1558   HGB 14.4 04/13/2015 0946   HCT 31 (A) 08/25/2023 0000   HCT 39.6 12/22/2022 1558   HCT 44.2 04/13/2015 0946   PLT 171 08/25/2023 0000   PLT 238 12/22/2022 1558   MCV 87 12/22/2022 1558   MCV 90.2 04/13/2015 0946   MCH 28.8 12/22/2022 1558   MCH 28.5 07/26/2021 1026   MCHC 33.1 12/22/2022 1558   MCHC 33.2 07/26/2021 1026   RDW 13.4 12/22/2022 1558   RDW 13.7 04/13/2015 0946   LYMPHSABS 0.8 12/22/2022 1558   LYMPHSABS 1.8 04/13/2015 0946   MONOABS 0.5 09/14/2020 1000   MONOABS 0.4 04/13/2015 0946   EOSABS 0.2 12/22/2022 1558   BASOSABS 0.1 12/22/2022  1558   BASOSABS 0.1 04/13/2015 0946   Absolute eos 12/22/22 - 2000  SATURATION QUALIFICATIONS: (This note is used to comply with regulatory documentation for home oxygen ) 07/04/23 Cala Castleman, CMA   Patient Saturations on Room Air at Rest = 96%   Patient Saturations on Room Air while Ambulating = 88%   Patient Saturations on 2 Liters of oxygen  while Ambulating = 98%       Assessment & Plan:   Discussion: 89 year old female former smoker with right breast cancer with relapse 2021 s/p lumpectomy and chemotherapy, right NSCLC s/p radiation in 2018, chronic left pleural effusion. HTN, HLD who presents for follow-up for chronic pleural effusion. CXR obtained and reviewed today. Chronic left effusion stable. Right pleural effusion with no recurrence since last thoracentesis in 08/2023  Suspect hypoxemia multifactorial in setting of chronic pleural effusion, deconditioning and suspected secondary pulmonary hypertension. Doubt PAH in setting of chronic lung disease. CT with no stigmata of ILD. Her overall conditions are not reversible and best management would be oxygen  support as needed and medical management with diuresis with Cardiology.  No changes with current management.  Chronic hypoxemic respiratory failure Dyspnea on exertion Chronic pleural effusion Muscular deconditioning --CONTINUE 2L oxygen  with activity and sleep for goal >90% --Plan for ambulatory O2 in June/July 2025  Right pleural effusion s/p thora on 08/30/23 - resolved. No intervention required. Chronic left pleural effusion - stable. --ORDER CXR for today --Reviewed with resolved right pleural effusion Stable left pleural effusion  Secondary pulmonary hypertension Severe TR, MR RHC of low utility since her primary treatment options are still limited to diuretics --Torsemide  20 mg  --Followed by Cardiology/HF with Dr. Alease French   Health Maintenance Immunization History  Administered Date(s) Administered   Fluad  Quad(high Dose 65+) 09/14/2020   Influenza, High Dose Seasonal  PF 10/06/2017   Influenza-Unspecified 09/18/2012, 09/27/2013, 09/24/2014, 10/01/2016   Moderna Covid-19 Vaccine  Bivalent Booster 70yrs & up 04/13/2023   Moderna SARS-COV2 Booster Vaccination 05/21/2021   Moderna Sars-Covid-2 Vaccination 12/28/2019, 01/29/2020   Pneumococcal Conjugate-13 03/18/2014   Pneumococcal Polysaccharide-23 12/04/2008, 02/19/2013   Td 12/04/2008, 04/11/2018   Zoster, Live 12/04/2008   CT Lung Screen - not qualified  Orders Placed This Encounter  Procedures   DG Chest 2 View    Standing Status:   Future    Number of Occurrences:   1    Expiration Date:   01/02/2025    Reason for Exam (SYMPTOM  OR DIAGNOSIS REQUIRED):   left pleural effusion    Preferred imaging location?:   [facility]  No orders of the defined types were placed in this encounter.   Return for with Dr. Alva in June 2025 for ambulatory O2. Please avoid Wed mornings.  I have spent a total time of 30-minutes on the day of the appointment including chart review, data review, collecting history, coordinating care and discussing medical diagnosis and plan with the patient/family. Past medical history, allergies, medications were reviewed. Pertinent imaging, labs and tests included in this note have been reviewed and interpreted independently by me  Johari Pinney Genetta Kenning, MD [facility] 01/03/2024 11:55 AM  Office Number [phone]

## 2024-01-09 ENCOUNTER — Encounter (HOSPITAL_BASED_OUTPATIENT_CLINIC_OR_DEPARTMENT_OTHER): Payer: Self-pay | Admitting: Pulmonary Disease

## 2024-01-11 DIAGNOSIS — C50211 Malignant neoplasm of upper-inner quadrant of right female breast: Secondary | ICD-10-CM | POA: Diagnosis not present

## 2024-01-11 DIAGNOSIS — Z170 Estrogen receptor positive status [ER+]: Secondary | ICD-10-CM | POA: Diagnosis not present

## 2024-01-11 DIAGNOSIS — Z96642 Presence of left artificial hip joint: Secondary | ICD-10-CM | POA: Diagnosis not present

## 2024-01-11 DIAGNOSIS — G4700 Insomnia, unspecified: Secondary | ICD-10-CM | POA: Diagnosis not present

## 2024-01-11 DIAGNOSIS — M5416 Radiculopathy, lumbar region: Secondary | ICD-10-CM | POA: Diagnosis not present

## 2024-01-11 DIAGNOSIS — H3530 Unspecified macular degeneration: Secondary | ICD-10-CM | POA: Diagnosis not present

## 2024-01-11 DIAGNOSIS — C3491 Malignant neoplasm of unspecified part of right bronchus or lung: Secondary | ICD-10-CM | POA: Diagnosis not present

## 2024-01-11 DIAGNOSIS — M810 Age-related osteoporosis without current pathological fracture: Secondary | ICD-10-CM | POA: Diagnosis not present

## 2024-01-11 DIAGNOSIS — I700 Atherosclerosis of aorta: Secondary | ICD-10-CM | POA: Diagnosis not present

## 2024-01-11 DIAGNOSIS — E7800 Pure hypercholesterolemia, unspecified: Secondary | ICD-10-CM | POA: Diagnosis not present

## 2024-01-11 DIAGNOSIS — R0609 Other forms of dyspnea: Secondary | ICD-10-CM | POA: Diagnosis not present

## 2024-01-11 DIAGNOSIS — H269 Unspecified cataract: Secondary | ICD-10-CM | POA: Diagnosis not present

## 2024-01-22 ENCOUNTER — Telehealth: Payer: Self-pay | Admitting: *Deleted

## 2024-01-22 NOTE — Telephone Encounter (Signed)
CALLED PATIENT TO INFORM OF CT FOR 01-25-24- ARRIVAL TIME-1:15 PM @ [facility], NO RESTRICTIONS TO SCAN, PATIENT TO BE CALLED ON 01-31-24 @ 10 AM BY ASHLYN BRUNING WITH RESULTS, SPOKE WITH PATIENT AND SHE IS AWARE OF THESE APPTS. AND THE INSTRUCTIONS

## 2024-01-24 ENCOUNTER — Ambulatory Visit: Payer: Medicare Other | Admitting: Urology

## 2024-01-25 ENCOUNTER — Ambulatory Visit (HOSPITAL_COMMUNITY)
Admission: RE | Admit: 2024-01-25 | Discharge: 2024-01-25 | Disposition: A | Discharge status: Home / Self Care | Payer: Medicare Other | Source: Ambulatory Visit | Attending: Urology | Admitting: Urology

## 2024-01-25 DIAGNOSIS — C3491 Malignant neoplasm of unspecified part of right bronchus or lung: Secondary | ICD-10-CM | POA: Insufficient documentation

## 2024-01-25 DIAGNOSIS — C3432 Malignant neoplasm of lower lobe, left bronchus or lung: Secondary | ICD-10-CM | POA: Diagnosis not present

## 2024-01-25 DIAGNOSIS — J9819 Other pulmonary collapse: Secondary | ICD-10-CM | POA: Diagnosis not present

## 2024-01-25 DIAGNOSIS — J9811 Atelectasis: Secondary | ICD-10-CM | POA: Diagnosis not present

## 2024-01-25 DIAGNOSIS — C3490 Malignant neoplasm of unspecified part of unspecified bronchus or lung: Secondary | ICD-10-CM | POA: Diagnosis not present

## 2024-01-25 DIAGNOSIS — N189 Chronic kidney disease, unspecified: Secondary | ICD-10-CM | POA: Diagnosis not present

## 2024-01-25 DIAGNOSIS — J90 Pleural effusion, not elsewhere classified: Secondary | ICD-10-CM | POA: Diagnosis not present

## 2024-01-25 LAB — CBC AND DIFFERENTIAL
HCT: 31 — AB (ref 36–46)
Hemoglobin: 10.1 — AB (ref 12.0–16.0)
Platelets: 189 10*3/uL (ref 150–400)
WBC: 4.5

## 2024-01-25 LAB — COMPREHENSIVE METABOLIC PANEL
Albumin: 3.5 (ref 3.5–5.0)
Calcium: 9.5 (ref 8.7–10.7)
Globulin: 2.5
eGFR: 69

## 2024-01-25 LAB — HEPATIC FUNCTION PANEL
ALT: 11 U/L (ref 7–35)
AST: 16 (ref 13–35)
Alkaline Phosphatase: 66 (ref 25–125)
Bilirubin, Total: 0.3

## 2024-01-25 LAB — BASIC METABOLIC PANEL
BUN: 19 (ref 4–21)
CO2: 30 — AB (ref 13–22)
Chloride: 98 — AB (ref 99–108)
Creatinine: 0.8 (ref 0.5–1.1)
Glucose: 92
Potassium: 4.4 meq/L (ref 3.5–5.1)
Sodium: 141 (ref 137–147)

## 2024-01-25 LAB — CBC: RBC: 3.6 — AB (ref 3.87–5.11)

## 2024-01-25 LAB — LIPID PANEL
Cholesterol: 148 (ref 0–200)
HDL: 65 (ref 35–70)
LDL Cholesterol: 70
LDl/HDL Ratio: 2.3
Triglycerides: 65 (ref 40–160)

## 2024-01-29 ENCOUNTER — Non-Acute Institutional Stay: Payer: Medicare Other | Admitting: Adult Health

## 2024-01-29 ENCOUNTER — Encounter: Payer: Self-pay | Admitting: Adult Health

## 2024-01-29 VITALS — BP 120/70 | HR 85 | Temp 98.2°F | Ht 65.0 in | Wt 122.4 lb

## 2024-01-29 DIAGNOSIS — Z853 Personal history of malignant neoplasm of breast: Secondary | ICD-10-CM

## 2024-01-29 DIAGNOSIS — C3491 Malignant neoplasm of unspecified part of right bronchus or lung: Secondary | ICD-10-CM

## 2024-01-29 DIAGNOSIS — F411 Generalized anxiety disorder: Secondary | ICD-10-CM | POA: Diagnosis not present

## 2024-01-29 DIAGNOSIS — I272 Pulmonary hypertension, unspecified: Secondary | ICD-10-CM | POA: Diagnosis not present

## 2024-01-29 DIAGNOSIS — E782 Mixed hyperlipidemia: Secondary | ICD-10-CM | POA: Diagnosis not present

## 2024-01-29 DIAGNOSIS — R000 Tachycardia, unspecified: Secondary | ICD-10-CM | POA: Diagnosis not present

## 2024-01-29 DIAGNOSIS — J9611 Chronic respiratory failure with hypoxia: Secondary | ICD-10-CM | POA: Diagnosis not present

## 2024-01-29 NOTE — Progress Notes (Signed)
 Location:  Solic[facility] of Service:  Clinic [facility]  Provider: Raylene Calamity NP  Goals of Care:     01/29/2024    2:45 PM  Advanced Directives  Does Patient Have a Medical Advance Directive? Yes  Type of Estate agent of [place];Living will;Out of facility DNR (pink MOST or yellow form)  Does patient want to make changes to medical advance directive? No - Patient declined  Copy of Healthcare Power of Attorney in Chart? Yes - validated most recent copy scanned in chart (See row information)       Chief Complaint  Patient presents with   Medical Management of Chronic Issues    3 month follow up      Discussed the use of AI scribe software for clinical note transcription with the patient, who gave verbal consent to proceed.  History of Present Illness   [name] French is a 89 year old female with secondary pulmonary hypertension who presents for follow-up on her respiratory status and medication management.  She experiences sporadic breathing difficulties, particularly with exertion, and is on continuous oxygen  therapy at 2 liters per minute. She uses a concentrator at home and a portable tank when outside. A pulse therapy device is occasionally used when sitting in restaurants, which does not exacerbate her shortness of breath. Mobility aids include a walker and a scooter, depending on the distance and location. A chest CT scan was performed on January 25, 2024, but has not yet been read. A previous chest X-ray in January showed no acute process but a small left pleural effusion. No significant changes in her breathing have been noted since then.  Her weight is stable around 122 pounds, monitored weekly. She takes torsemide  daily to manage fluid retention, with no significant leg swelling and minimal edema observed. She is on pravastatin  for cholesterol management, LDL 70 01/25/24. She takes Lexapro  for depression and anxiety, which she  finds beneficial, with no current anxiety issues and satisfaction with her current dose.  She experiences nasal congestion and occasional epistaxis, attributed to phlegm and dryness. Flonase  is used twice daily, helping with allergies but possibly contributing to nasal irritation. Occasional blood-tinged mucus and morning coughing spells are reported.  She has received one dose of the Shingrix vaccine but did not complete the two-dose series due to pneumonia. She has had the Zostavax vaccine in 2009.     PMH significant for pleural effusion biltateral with thoracentesis on the right 08/2023  Other issues are a hx of breast ca on the right s/p bilateral lumpectomy in 2014 with recurrence in 2021 on the right s/p another right lumpectomy, given estrogen therapy and chemo. In 2018 she was found to have NSCLC on the right and received radiation  Pulmonary HTN 2D echo 08/01/23 Ef 60-65%, severe pulmonary HTN and severe TR  Past Medical History:  Diagnosis Date   Allergic rhinitis    Anemia    during pregnancy   Arthritis    Breast cancer (HCC) 05/08/13   right upper inner, invasive mammary   Breast cancer, right breast (HCC) 04/16/2013   Underwent lumpectomy on 11/06/13. 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;  Service: General;  Laterality: Bilateral;   COLONOSCOPY     EYE SURGERY Bilateral    both cataracts   MOHS SURGERY Right    nose basal/squamous   THORACENTESIS N/A 08/30/2023   Procedure: THORACENTESIS;  Surgeon: Quillian Brunt, MD;  Location: [facility];  Service: Pulmonary;  Laterality: N/A;   TOTAL HIP ARTHROPLASTY Left 08/31/2018   Procedure: LEFT TOTAL HIP ARTHROPLASTY ANTERIOR APPROACH;  Surgeon: Arnie Lao, MD;  Location: [facility];  Service: Orthopedics;  Laterality: Left;   VIDEO BRONCHOSCOPY WITH ENDOBRONCHIAL NAVIGATION N/A 11/01/2017   Procedure: VIDEO BRONCHOSCOPY WITH ENDOBRONCHIAL NAVIGATION;  Surgeon: Zelphia Higashi, MD;  Location: [facility];  Service: Thoracic;  Laterality: N/A;   WRIST SURGERY  1990   lt      Allergies  Allergen Reactions   Other Anaphylaxis    Lobster- "years ago"   Codeine Itching    Insomnia    Hydrocodone  Itching and Other (See Comments)    Insomnia   Neosporin [Neomycin-Bacitracin Zn-Polymyx] Rash      Outpatient Encounter Medications as of 01/29/2024  Medication Sig   aspirin  81 MG tablet Take 81 mg by mouth daily.   B Complex-C-Folic Acid (SUPER B COMPLEX/FA/VIT C) TABS Take by mouth.   cetirizine (ZYRTEC) 10 MG tablet Take 10 mg by mouth daily.   Cholecalciferol  (VITAMIN D -3) 1000 UNITS CAPS Take 2,000 Units by mouth daily.   doxylamine , Sleep, (UNISOM ) 25 MG tablet Take 25 mg by mouth at bedtime.   escitalopram  (LEXAPRO ) 5 MG tablet Take 1 tablet (5 mg total) by mouth daily.   famotidine  (PEPCID ) 20 MG tablet Take 1 tablet (20 mg total) by mouth daily.   fluticasone  (FLONASE ) 50 MCG/ACT  nasal spray Place 2 sprays into both nostrils daily.   levalbuterol  (XOPENEX ) 0.63 MG/3ML nebulizer solution Take 3 mLs (0.63 mg total) by nebulization every 6 (six) hours as needed for wheezing or shortness of breath.   metoprolol  tartrate (LOPRESSOR ) 25 MG tablet Take 1 tablet (25 mg total) by mouth in the morning.   metoprolol  tartrate (LOPRESSOR ) 25 MG tablet Take 12.5 mg by mouth at bedtime. Hold if SBP< 90   Multiple Vitamins-Minerals (PRESERVISION AREDS 2) CAPS Take 1 mg by mouth in the morning and at bedtime.   nitroGLYCERIN  (NITROSTAT ) 0.4 MG SL tablet Place 0.4 mg under the tongue every 5 (five) minutes as needed for chest pain.   OXYGEN  Inhale 3 L into the lungs as directed. Every Shift; Shift 1, Shift 2, Shift 3   potassium chloride  (KLOR-CON  M) 10 MEQ tablet Take 10 mEq  by mouth daily.   pravastatin  (PRAVACHOL ) 40 MG tablet Take 40 mg daily by mouth.    Probiotic Product (ALIGN) 4 MG CAPS Take 4 mg by mouth daily. 1 capsule oral once a day   torsemide  (DEMADEX ) 20 MG tablet Take 1 tablet (20 mg total) by mouth daily. 1 tablet once a morning   No facility-administered encounter medications on file as of 01/29/2024.     Review of Systems:  Review of Systems  Constitutional:  Negative for activity change, appetite change, chills, diaphoresis, fatigue, fever and unexpected weight change.  HENT:  Positive for nosebleeds and rhinorrhea. Negative for congestion.   Respiratory:  Positive for cough and shortness of breath. Negative for wheezing.   Cardiovascular:  Negative for chest pain, palpitations and leg swelling.  Gastrointestinal:  Negative for abdominal distention, abdominal pain, constipation and diarrhea.  Genitourinary:  Negative for difficulty urinating and dysuria.  Musculoskeletal:  Positive for arthralgias and gait problem. Negative for back pain, joint swelling and myalgias.  Neurological:  Negative for dizziness, tremors, seizures, syncope, facial asymmetry, speech  difficulty, weakness, light-headedness, numbness and headaches.  Psychiatric/Behavioral:  Negative for agitation, behavioral problems and confusion.       Health Maintenance  Topic Date Due   Zoster Vaccines- Shingrix (1 of 2) 11/23/1945   COVID-19 Vaccine (4 - 2024-25 season) 08/20/2023   Medicare Annual Wellness (AWV)  05/24/2024   DTaP/Tdap/Td (3 - Tdap) 04/11/2028   Pneumonia Vaccine 17+ Years old  Completed   INFLUENZA VACCINE  Completed   DEXA SCAN  Completed   HPV VACCINES  Aged Out   Colonoscopy  Discontinued     Physical Exam:   Vitals:   01/29/24 1439  BP: 120/70  Pulse: 85  Temp: 98.2 F (36.8 C)  SpO2: 95%  Weight: 122 lb 6.4 oz (55.5 kg)  Height: 5\' 5"  (1.651 m)    Body mass index is 20.37 kg/m.  Physical Exam Vitals and nursing note reviewed.  Constitutional:      General: She is not in acute distress.    Appearance: She is not diaphoretic.  HENT:     Head: Normocephalic and atraumatic.     Right Ear: Tympanic membrane normal.     Left Ear: Tympanic membrane normal.     Nose: Nose normal.     Mouth/Throat:     Mouth: Mucous membranes are moist.     Pharynx: Oropharynx is clear.  Eyes:     Conjunctiva/sclera: Conjunctivae normal.     Pupils: Pupils are equal, round, and reactive to light.  Neck:     Vascular: No JVD.  Cardiovascular:     Rate and Rhythm: Normal rate and regular rhythm.     Heart sounds: No murmur heard. Pulmonary:     Effort: Pulmonary effort is normal. No respiratory distress.     Breath sounds: Normal breath sounds. No wheezing.     Comments: Decreased bases Abdominal:     General: Bowel sounds are normal. There is no distension.     Palpations: Abdomen is soft.     Tenderness: There is no abdominal tenderness.  Musculoskeletal:     Right lower leg: No edema.     Left lower leg: No edema.  Skin:    General: Skin is warm and dry.  Neurological:     Mental Status: She is alert and oriented to person, place, and time.       Labs reviewed:  Basic Metabolic Panel:   Recent Labs  08/11/23 1307 08/11/23 1307 08/25/23 0000 08/31/23 0000 09/11/23 0000 01/25/24 0000  NA 143  --  139 141 143 141  K 4.1  --  5.4* 3.9 3.9 4.4  CL 94*  --  94* 88* 90* 98*  CO2 37*  --  31* 45* 44* 30*  GLUCOSE 99  --   --   --   --   --   BUN 17  --  17 22* 19 19  CREATININE 0.70   < > 0.7 0.8 0.7 0.8  CALCIUM 9.6  --  9.7 9.3 9.7 9.5  TSH  --   --  2.40  --   --   --    < > = values in this interval not displayed.    Liver Function Tests:   Recent Labs    08/25/23 0000 08/30/23 1611 01/25/24 0000  AST 32  --  16  ALT 14  --  11  ALKPHOS 61  --  66  PROT  --  6.6  --   ALBUMIN 3.7  --  3.5    No results for input(s): "LIPASE", "AMYLASE" in the last 8760 hours.  No results for input(s): "AMMONIA" in the last 8760 hours.  CBC:   Recent Labs    08/25/23 0000 01/25/24 0000  WBC 4.8 4.5  HGB 10.2* 10.1*  HCT 31* 31*  PLT 171 189    Lipid Panel:   Recent Labs    01/25/24 0000  CHOL 148  HDL 65  LDLCALC 70  TRIG 65     Lab Results  Component Value Date   HGBA1C 5.6 12/25/2017     Procedures since last visit:  DG Chest 2 View Result Date: 01/09/2024 CLINICAL DATA:  Left pleural effusion.  Follow-up study. EXAM: CHEST - 2 VIEW COMPARISON:  09/14/2023. FINDINGS: Cardiac silhouette is normal in size. No mediastinal or hilar masses. No convincing adenopathy. Moderate left pleural effusion.  Trace right pleural effusion. Lungs are hyperexpanded. Left greater than right pleuroparenchymal scarring. Opacity at the left base adjacent to the pleural effusion consistent with atelectasis, stable. Remainder of the lungs is clear. No pneumothorax. Skeletal structures are grossly intact. IMPRESSION: 1. No acute cardiopulmonary disease. 2. Small left pleural effusion with associated dependent atelectasis, unchanged from the prior exam. Electronically Signed   By: Amanda Jungling M.D.   On: 01/09/2024 08:23      Assessment and Plan    Chronic Hypoxia On 2L continuous oxygen  via nasal cannula. Reports exertional dyspnea. No recent changes in oxygen  requirements. -Continue current oxygen  therapy.  Pulmonary Hypertension Secondary  -Continue current management with Torsemide  (Demodex).  Nasal Irritation Reports frequent nasal drainage and occasional blood-tinged mucus. Nasal examination showed redness and dryness. Currently using Flonase . -Consider use of saline spray or gel for nasal moisture.  Shingles Vaccination Received one dose of Shingrix, but second dose was delayed due to pneumonia and subsequent vaccine shortage. Received another dose at a later date. -Confirm vaccination status and dates.  Follow-up  -Schedule follow-up appointment in three months.     Sinus tachycardia Controlled with metoprolol   Hx of breast ca Followed by oncology  Mixed HLD Continue statin  Hx of non small cell lung ca Followed by onc and pulmonary  S/p radiation   Generalized anxiety disorder Continue lexapro     Next appt: 3 months

## 2024-01-30 NOTE — Progress Notes (Signed)
Telephone nursing appointment for review of most recent CT-Chest. I verified patient's identity x2 and began nursing interview.   Patient reports moderate SOB but is on 2 units of oxygen, and is managing well. Patient denies chest/ arm pain, dizziness and any other related issues at this time.   Meaningful use complete.   Patient aware of their 10am-01/31/24 telephone appointment w/ Ashlyn Bruning PA-C. I left my extension [phone] in case patient needs anything. Patient verbalized understanding. This concludes the nursing interview.   Patient contact [phone]     Ruel Favors, LPN

## 2024-01-31 ENCOUNTER — Encounter: Payer: Self-pay | Admitting: Urology

## 2024-01-31 ENCOUNTER — Ambulatory Visit
Admission: RE | Admit: 2024-01-31 | Discharge: 2024-01-31 | Disposition: A | Discharge status: Home / Self Care | Payer: Medicare Other | Source: Ambulatory Visit | Attending: Urology | Admitting: Urology

## 2024-01-31 DIAGNOSIS — C3491 Malignant neoplasm of unspecified part of right bronchus or lung: Secondary | ICD-10-CM

## 2024-01-31 DIAGNOSIS — C3432 Malignant neoplasm of lower lobe, left bronchus or lung: Secondary | ICD-10-CM | POA: Diagnosis not present

## 2024-01-31 DIAGNOSIS — C342 Malignant neoplasm of middle lobe, bronchus or lung: Secondary | ICD-10-CM | POA: Diagnosis not present

## 2024-01-31 NOTE — Progress Notes (Signed)
[name] French  Radiation Oncology         [phone] ________________________________  Name: [name] French MRN: [id number]  Date: 01/31/2024  DOB: 04/10/1926  Post Treatment Note  CC: Mahlon Gammon, MD  Tisovec, Adelfa Koh, MD  Diagnosis:   89 y.o. woman with history of synchronous NSCLC, adenocarcinoma with a 6 cm LLL mass extending into the left hilum (stage IIB, T3N0M0) and a 2.4 cm RML mass (stage IB T2N0).      Interval Since Last Radiation:  6 years 11/28/17 - 01/18/18:  66 Gy directed to the LLL and RML lung lesions in 33 fractions of 2 Gy each.  Narrative:  I spoke with the patient to conduct her routine scheduled annual follow up visit to review her recent follow-up CT chest results via telephone to spare the patient unnecessary potential exposure in the healthcare setting during the current COVID-19 pandemic.  The patient was notified in advance and gave permission to proceed with this visit format.  Her most recent CT Chest rom 01/25/24 shows a stable appearance of the presumed post radiation changes in the inferomedial left hemithorax with complete collapse of the left lower lobe lung and associated small-to-moderate chronic left pleural effusion with thickening. Additional bandlike scarring and fibrosis of the anterior right upper lobe is stable and there are no new findings to suggest lung cancer progression or recurrence.     In summary, she has a remote history of breast cancer diagnosed and treated in 2014. She presented to her PCP in October 2018 with persistent dry cough that led to an isolated episode of hemoptysis. She denied further episodes of hemoptysis since that time. She presented to her PCP for evaluation and was being worked up for possible pneumonia with a chest x-ray and a CT scan of the chest. CT done on 10/11/2017 showed a 6.0 cm LLL lung mass with additional lung nodules, including a 2.4 cm pulmonary nodule in the RML as well as 0.3 cm satellite nodules. There was also  a hypodense 0.4 cm RLL lesion. A PET CT scan on 112/18 demonstrated a hypermetabolic LLL mass extending into the left hilum with SUV of 26.8 (stage IIb, T3N0M0) and a mildly hypermetabolic RML mass with SUV max of 1.9 (stage IB T2N0).  There were no hypermetabolic hilar or mediastinal nodes.  She was evaluated with Dr. Dorris Fetch and underwent bilateral navigational bronchoscopy with biopsies of the lung masses on 11/01/17 which revealed bilateral non-small cell lung cancer, poorly differentiated adenocarcinoma. An MRI brain was performed on 10/20/2017 for disease staging and incidentally showed a right inferior parietal convexity meningioma measuring 2.9 x 2.7 x 1.4 cm. There was no evidence of metastatic disease.  We initially met her in consult on 11/15/18 at the request of Dr. Pamelia Hoit for newly diagnosed non-small cell lung cancer and incidental finding of meningioma.   Her imaging was reviewed at the multidisciplinary brain conference and consensus recommendation was to repeat a scan in 3 months to assess for progression/enlargement. Repeat brain MRI was performed on 01/24/2018 demonstrating a persistent right temporoparietal meningioma unchanged in size with only mild vasogenic edema which appeared improved from her previous study.  There was no evidence of metastatic disease to the brain.  This study was again reviewed at the multidisciplinary brain conference on 01/29/2018 and consensus recommendation was to only repeat brain imaging if the patient develops symptoms.  She did not receive any systemic treatment for the lung cancer, as the feeling was that concurrent chemoradiation  would be too toxic for the patient due to her age.  However, molecular studies were ordered to assess for a potential role of immunotherapy if she demonstrated PD1 expression greater than 50% but her molecular studies returned showing only a very low expression of PD1 at 5%.  There were no other identifiable mutations noted.  She  completed a 6.5 week course of chest radiotherapy which she tolerated relatively well and has remained in observation with Dr. Pamelia Hoit regarding her history of breast cancer.  Unfortunately, she was found to have a recurrence of her right breast cancer with a 4.2 cm mass in the right breast detected on diagnostic mammography on December 23, 2019 with confirmation of a grade 2 invasive lobular carcinoma with LCIS, ER/PR positive, and HER-2 neu positive via right breast biopsy performed on 01/03/2020.  Fortunately, there was no evidence of distant metastatic disease on CT C/A/P 01/17/2020.  She had a lumpectomy with Dr. Magnus Ivan on 02/06/20 and has done very well with no evidence of recurrence on follow-up mammograms.                        On review of systems, the patient states that she is doing fair overall. Currently, she denies orthopnea, chest pain, dysphagia, fever, chills, cough or hemoptysis. She has persistent baseline shortness of breath with exertion and had a repeat thoracentesis on 08/30/2023, under the care of Dr. Everardo All, due to progressive symptoms. She has continued in close follow up with pulmonology and remains on 2L continuous oxygen via nasal cannula. Currently, she reports exertional dyspnea but no recent changes in oxygen requirements.  She denies dyspnea at rest and reports that the shortness of breath with exertion resolves quickly with rest. She has continued to socialize at her extended living facility, [facility], and is still able to get herself down to the dining room for meals.  She is eating regularly, in an attempt to maintain weight, and still enjoys the foods at [facility] but just has little desire to eat most days.  She has continued to tolerate the dose adjusted metoprolol for tachycardia and has continued in follow up with Dr. Peter [place]. He recently started her on Lasix for some RLE swelling and fatigue. She denies headaches, changes in visual or auditory acuity, difficulty with  speech or word finding, imbalance, tinnitus, tremor or seizure activity.    ALLERGIES:  is allergic to other, codeine, hydrocodone, and neosporin [neomycin-bacitracin zn-polymyx].  Meds: Current Outpatient Medications  Medication Sig Dispense Refill   aspirin 81 MG tablet Take 81 mg by mouth daily.     B Complex-C-Folic Acid (SUPER B COMPLEX/FA/VIT C) TABS Take by mouth.     cetirizine (ZYRTEC) 10 MG tablet Take 10 mg by mouth daily.     Cholecalciferol (VITAMIN D-3) 1000 UNITS CAPS Take 2,000 Units by mouth daily.     doxylamine, Sleep, (UNISOM) 25 MG tablet Take 25 mg by mouth at bedtime.     escitalopram (LEXAPRO) 5 MG tablet Take 1 tablet (5 mg total) by mouth daily.     famotidine (PEPCID) 20 MG tablet Take 1 tablet (20 mg total) by mouth daily.     fluticasone (FLONASE) 50 MCG/ACT nasal spray Place 2 sprays into both nostrils daily. 16 g 6   levalbuterol (XOPENEX) 0.63 MG/3ML nebulizer solution Take 3 mLs (0.63 mg total) by nebulization every 6 (six) hours as needed for wheezing or shortness of breath.     metoprolol tartrate (LOPRESSOR)  25 MG tablet Take 1 tablet (25 mg total) by mouth in the morning.     metoprolol tartrate (LOPRESSOR) 25 MG tablet Take 12.5 mg by mouth at bedtime. Hold if SBP< 90     Multiple Vitamins-Minerals (PRESERVISION AREDS 2) CAPS Take 1 mg by mouth in the morning and at bedtime.     nitroGLYCERIN (NITROSTAT) 0.4 MG SL tablet Place 0.4 mg under the tongue every 5 (five) minutes as needed for chest pain.     OXYGEN Inhale 3 L into the lungs as directed. Every Shift; Shift 1, Shift 2, Shift 3     potassium chloride (KLOR-CON M) 10 MEQ tablet Take 10 mEq by mouth daily.     pravastatin (PRAVACHOL) 40 MG tablet Take 40 mg daily by mouth.      Probiotic Product (ALIGN) 4 MG CAPS Take 4 mg by mouth daily. 1 capsule oral once a day     torsemide (DEMADEX) 20 MG tablet Take 1 tablet (20 mg total) by mouth daily. 1 tablet once a morning     No current  facility-administered medications for this encounter.    Physical Findings:  vitals were not taken for this visit.  Pain Assessment Pain Score: 0-No pain/10 Unable to assess due to telephone follow up visit format.  Lab Findings: Lab Results  Component Value Date   WBC 4.5 01/25/2024   HGB 10.1 (A) 01/25/2024   HCT 31 (A) 01/25/2024   MCV 87 12/22/2022   PLT 189 01/25/2024     Radiographic Findings: CT Chest Wo Contrast Result Date: 01/31/2024 CLINICAL DATA:  Non-small cell lung cancer (NSCLC), monitor continued surveillance for h/o NSCLC, now 5 years disease free s/p treatment. * Tracking Code: BO * EXAM: CT CHEST WITHOUT CONTRAST TECHNIQUE: Multidetector CT imaging of the chest was performed following the standard protocol without IV contrast. RADIATION DOSE REDUCTION: This exam was performed according to the departmental dose-optimization program which includes automated exposure control, adjustment of the mA and/or kV according to patient size and/or use of iterative reconstruction technique. COMPARISON:  CT angiography chest from 08/29/2023. FINDINGS: Cardiovascular: Normal cardiac size. No pericardial effusion. No aortic aneurysm. There are coronary artery calcifications, in keeping with coronary artery disease. There are also mild-to-moderate peripheral atherosclerotic vascular calcifications of thoracic aorta and its major branches. Mediastinum/Nodes: Visualized thyroid gland appears grossly unremarkable. No solid / cystic mediastinal masses. The esophagus is nondistended precluding optimal assessment. There are few mildly prominent mediastinal lymph nodes, which do not meet the size criteria for lymphadenopathy and appear decreased in size since the prior study. No axillary lymphadenopathy by size criteria. Evaluation of bilateral hila is limited due to lack on intravenous contrast: however, no large hilar lymphadenopathy identified. Lungs/Pleura: The trachea and right bronchial tree  is patent. There is gradual complete occlusion of the left lung lower lobe bronchus, similar to the prior study. There is associated complete collapse of the left lung lower lobe, similar to the prior study. There is associated small-to-moderate chronic left pleural effusion with associated pleural thickening. There is linear area of scarring/atelectasis along the minor fissure, similar to the prior study. There is focal scarring along the left lung upper lobe, laterally, likely from prior surgery. No new lung mass, consolidation or pneumothorax. No suspicious lung nodule. There is trace right pleural effusion. There are patchy areas of mosaic attenuation of lungs, consistent with heterogeneous air trapping related to small airways disease. Upper Abdomen: Partially seen single calcified gallstone without overt signs of acute cholecystitis  in the visualized portion. There is a small sliding hiatal hernia. There is a 8 by 11 mm left adrenal nodule, incompletely characterized on the current examination but unchanged since the prior study and favored to represent an adenoma. Remaining visualized upper abdominal viscera within normal limits. Musculoskeletal: The visualized soft tissues of the chest wall are grossly unremarkable. There is diffuse osteopenia of the visualized osseous structures. No suspicious osseous lesions. There are mild multilevel degenerative changes in the visualized spine. IMPRESSION: 1. Essentially stable exam. 2. Redemonstration of complete collapse of the left lung lower lobe and associated small-to-moderate chronic left pleural effusion with thickening. 3. Linear area of scarring/atelectasis along the minor fissure and focal scarring along the left lung upper lobe, similar to the prior study. 4. No new lung mass, consolidation, pneumothorax or suspicious lung nodule. 5. Multiple other nonacute observations, as described above. Aortic Atherosclerosis (ICD10-I70.0). Electronically Signed   By:  Jules Schick M.D.   On: 01/31/2024 09:06   DG Chest 2 View Result Date: 01/09/2024 CLINICAL DATA:  Left pleural effusion.  Follow-up study. EXAM: CHEST - 2 VIEW COMPARISON:  09/14/2023. FINDINGS: Cardiac silhouette is normal in size. No mediastinal or hilar masses. No convincing adenopathy. Moderate left pleural effusion.  Trace right pleural effusion. Lungs are hyperexpanded. Left greater than right pleuroparenchymal scarring. Opacity at the left base adjacent to the pleural effusion consistent with atelectasis, stable. Remainder of the lungs is clear. No pneumothorax. Skeletal structures are grossly intact. IMPRESSION: 1. No acute cardiopulmonary disease. 2. Small left pleural effusion with associated dependent atelectasis, unchanged from the prior exam. Electronically Signed   By: Amie Portland M.D.   On: 01/09/2024 08:23     Impression/Plan: 1. 89 y.o. female with synchronous NSCLC, adenocarcinoma with a 6 cm LLL mass extending into the left hilum (stage IIB, T3N0M0) and a 2.4 cm RML mass (stage IB T2N0).   She remains stable both clinically and radiographically with regards to the lung cancer.  Her most recent CT Chest from 01/25/24 shows a stable appearance of presumed post radiation changes in the inferomedial left hemithorax with with complete collapse of the left lower lobe lung and associated small-to-moderate chronic left pleural effusion with thickening. Additional bandlike scarring and fibrosis of the anterior right upper lobe is stable and there are no new findings to suggest lung cancer progression or recurrence.  We will continue with annual low dose chest CT scans for continued surveillance and I will call her following each scan to review results and recommendations.  She knows to call us or pulmonology immediately for any progressive SOB or concerning symptoms in the interim.  She appears to have a good understanding of her disease and our recommendations and is in agreement with the  stated plan.  2.         Meningioma. This was an incidental finding and patient is currently without neurologic or systemic complaints.  It does not appear to be causing a mass effect on any structures in the brain and remains stable on MRI brain from 01/24/2018. The consensus at brain conference from 01/29/2018 is to only repeat brain imaging if she becomes symptomatic.  Otherwise we will just follow this expectantly and only repeat further brain imaging should she develop concerning symptoms. She is in agreement with and is comfortable with this plan.  3. Tachycardia.  This is being managed by her cardiologist, Dr. Peter Sw[place]iland.  4. History of recurrent right breast cancer. She was found to have a recurrence  of her right breast cancer with a 4.2 cm mass in the right breast detected on diagnostic mammography on December 23, 2019 with confirmation of a grade 2 invasive lobular carcinoma with LCIS, ER/PR positive, and HER-2 neu positive via right breast biopsy performed on 01/03/2020.  Fortunately, there was no evidence of distant metastatic disease on CT C/A/P 01/17/2020.  She had a lumpectomy with Dr. Magnus Ivan on 02/06/20 but adamantly not interested in adjuvant radiotherapy.  Follow up imaging has not shown any definite evidence of disease recurrence or progression. She will continue in routine follow up for continued systemic disease management with Dr. Pamelia Hoit and has her next appointment scheduled for 03/26/24.   I personally spent 30 minutes in this encounter including chart review, reviewing radiological studies, meeting telephone conversation with the patient, entering orders and completing documentation.     Marguarite Arbour, PA-C

## 2024-02-07 ENCOUNTER — Emergency Department (HOSPITAL_COMMUNITY): Payer: Medicare Other

## 2024-02-07 ENCOUNTER — Other Ambulatory Visit: Payer: Self-pay

## 2024-02-07 ENCOUNTER — Inpatient Hospital Stay (HOSPITAL_COMMUNITY)
Admission: EM | Admit: 2024-02-07 | Discharge: 2024-02-09 | DRG: 175 | Disposition: A | Discharge status: Skilled Nursing Facility | Payer: Medicare Other | Attending: Internal Medicine | Admitting: Internal Medicine

## 2024-02-07 ENCOUNTER — Encounter (HOSPITAL_COMMUNITY): Payer: Self-pay

## 2024-02-07 ENCOUNTER — Observation Stay (HOSPITAL_BASED_OUTPATIENT_CLINIC_OR_DEPARTMENT_OTHER): Payer: Medicare Other

## 2024-02-07 DIAGNOSIS — Z7982 Long term (current) use of aspirin: Secondary | ICD-10-CM | POA: Diagnosis not present

## 2024-02-07 DIAGNOSIS — Z96642 Presence of left artificial hip joint: Secondary | ICD-10-CM | POA: Diagnosis present

## 2024-02-07 DIAGNOSIS — Z79811 Long term (current) use of aromatase inhibitors: Secondary | ICD-10-CM

## 2024-02-07 DIAGNOSIS — Z66 Do not resuscitate: Secondary | ICD-10-CM | POA: Diagnosis present

## 2024-02-07 DIAGNOSIS — Z85118 Personal history of other malignant neoplasm of bronchus and lung: Secondary | ICD-10-CM

## 2024-02-07 DIAGNOSIS — Z79899 Other long term (current) drug therapy: Secondary | ICD-10-CM

## 2024-02-07 DIAGNOSIS — R042 Hemoptysis: Secondary | ICD-10-CM | POA: Diagnosis present

## 2024-02-07 DIAGNOSIS — Z9981 Dependence on supplemental oxygen: Secondary | ICD-10-CM

## 2024-02-07 DIAGNOSIS — Z85828 Personal history of other malignant neoplasm of skin: Secondary | ICD-10-CM | POA: Diagnosis not present

## 2024-02-07 DIAGNOSIS — R000 Tachycardia, unspecified: Secondary | ICD-10-CM | POA: Diagnosis not present

## 2024-02-07 DIAGNOSIS — R918 Other nonspecific abnormal finding of lung field: Secondary | ICD-10-CM | POA: Diagnosis not present

## 2024-02-07 DIAGNOSIS — I10 Essential (primary) hypertension: Secondary | ICD-10-CM | POA: Diagnosis present

## 2024-02-07 DIAGNOSIS — Z87891 Personal history of nicotine dependence: Secondary | ICD-10-CM | POA: Diagnosis not present

## 2024-02-07 DIAGNOSIS — E785 Hyperlipidemia, unspecified: Secondary | ICD-10-CM | POA: Diagnosis present

## 2024-02-07 DIAGNOSIS — Z9221 Personal history of antineoplastic chemotherapy: Secondary | ICD-10-CM

## 2024-02-07 DIAGNOSIS — Z885 Allergy status to narcotic agent status: Secondary | ICD-10-CM

## 2024-02-07 DIAGNOSIS — J90 Pleural effusion, not elsewhere classified: Secondary | ICD-10-CM | POA: Diagnosis not present

## 2024-02-07 DIAGNOSIS — I700 Atherosclerosis of aorta: Secondary | ICD-10-CM | POA: Diagnosis not present

## 2024-02-07 DIAGNOSIS — Z853 Personal history of malignant neoplasm of breast: Secondary | ICD-10-CM | POA: Diagnosis not present

## 2024-02-07 DIAGNOSIS — I2609 Other pulmonary embolism with acute cor pulmonale: Principal | ICD-10-CM

## 2024-02-07 DIAGNOSIS — C3490 Malignant neoplasm of unspecified part of unspecified bronchus or lung: Secondary | ICD-10-CM | POA: Diagnosis not present

## 2024-02-07 DIAGNOSIS — J9621 Acute and chronic respiratory failure with hypoxia: Secondary | ICD-10-CM | POA: Diagnosis present

## 2024-02-07 DIAGNOSIS — I959 Hypotension, unspecified: Secondary | ICD-10-CM | POA: Diagnosis not present

## 2024-02-07 DIAGNOSIS — Z923 Personal history of irradiation: Secondary | ICD-10-CM

## 2024-02-07 DIAGNOSIS — R110 Nausea: Secondary | ICD-10-CM | POA: Diagnosis not present

## 2024-02-07 DIAGNOSIS — R059 Cough, unspecified: Secondary | ICD-10-CM | POA: Diagnosis not present

## 2024-02-07 DIAGNOSIS — R0902 Hypoxemia: Secondary | ICD-10-CM | POA: Diagnosis not present

## 2024-02-07 DIAGNOSIS — I2699 Other pulmonary embolism without acute cor pulmonale: Secondary | ICD-10-CM | POA: Diagnosis not present

## 2024-02-07 DIAGNOSIS — Z8249 Family history of ischemic heart disease and other diseases of the circulatory system: Secondary | ICD-10-CM | POA: Diagnosis not present

## 2024-02-07 LAB — COMPREHENSIVE METABOLIC PANEL
ALT: 12 U/L (ref 0–44)
AST: 18 U/L (ref 15–41)
Albumin: 3.3 g/dL — ABNORMAL LOW (ref 3.5–5.0)
Alkaline Phosphatase: 53 U/L (ref 38–126)
Anion gap: 6 (ref 5–15)
BUN: 23 mg/dL (ref 8–23)
CO2: 36 mmol/L — ABNORMAL HIGH (ref 22–32)
Calcium: 9.5 mg/dL (ref 8.9–10.3)
Chloride: 98 mmol/L (ref 98–111)
Creatinine, Ser: 0.75 mg/dL (ref 0.44–1.00)
GFR, Estimated: >60 mL/min (ref >60)
Glucose, Bld: 100 mg/dL — ABNORMAL HIGH (ref 70–99)
Potassium: 4.1 mmol/L (ref 3.5–5.1)
Sodium: 140 mmol/L (ref 135–145)
Total Bilirubin: 0.6 mg/dL (ref 0.0–1.2)
Total Protein: 6.5 g/dL (ref 6.5–8.1)

## 2024-02-07 LAB — CBC WITH DIFFERENTIAL/PLATELET
Abs Immature Granulocytes: 0.02 10*3/uL (ref 0.00–0.07)
Basophils Absolute: 0.1 10*3/uL (ref 0.0–0.1)
Basophils Relative: 1 %
Eosinophils Absolute: 0.1 10*3/uL (ref 0.0–0.5)
Eosinophils Relative: 2 %
HCT: 34.1 % — ABNORMAL LOW (ref 36.0–46.0)
Hemoglobin: 10.6 g/dL — ABNORMAL LOW (ref 12.0–15.0)
Immature Granulocytes: 0 %
Lymphocytes Relative: 15 %
Lymphs Abs: 1 10*3/uL (ref 0.7–4.0)
MCH: 28 pg (ref 26.0–34.0)
MCHC: 31.1 g/dL (ref 30.0–36.0)
MCV: 90 fL (ref 80.0–100.0)
Monocytes Absolute: 0.5 10*3/uL (ref 0.1–1.0)
Monocytes Relative: 7 %
Neutro Abs: 5.1 10*3/uL (ref 1.7–7.7)
Neutrophils Relative %: 75 %
Platelets: 164 10*3/uL (ref 150–400)
RBC: 3.79 MIL/uL — ABNORMAL LOW (ref 3.87–5.11)
RDW: 15.9 % — ABNORMAL HIGH (ref 11.5–15.5)
WBC: 6.8 10*3/uL (ref 4.0–10.5)
nRBC: 0 % (ref 0.0–0.2)

## 2024-02-07 LAB — TYPE AND SCREEN
ABO/RH(D): A POS
Antibody Screen: NEGATIVE

## 2024-02-07 MED ORDER — HEPARIN (PORCINE) 25000 UT/250ML-% IV SOLN: 850.0000 [IU]/h | INTRAVENOUS | Status: DC

## 2024-02-07 MED ORDER — HEPARIN (PORCINE) 25000 UT/250ML-% IV SOLN: 900.0000 [IU]/h | INTRAVENOUS | Status: DC

## 2024-02-07 MED ORDER — HEPARIN BOLUS VIA INFUSION: 1650.0000 [IU] | INTRAVENOUS | Status: DC

## 2024-02-07 MED ORDER — SODIUM CHLORIDE 0.9 % IV SOLN: INTRAVENOUS | Status: DC

## 2024-02-07 MED ORDER — IOHEXOL 300 MG/ML  SOLN
75.0000 mL | Freq: Once | INTRAMUSCULAR | Status: AC | PRN
Start: 2024-02-07 — End: 2024-02-07
  Administered 2024-02-07: 75 mL via INTRAVENOUS

## 2024-02-07 NOTE — ED Provider Notes (Addendum)
[place] EMERGENC[facility] DEPARTMENT AT [facility] Provider Note   CSN: [id number] Arrival date & time: 02/07/24  1004     History  Chief Complaint  Patient presents with   Cough    [name] French is a 89 y.o. female.  89 year old female who with history of lung cancer who is on chronic O2 at 2 L presents with hemoptysis began this morning.  Was in her baseline state of health until this morning when she thought maybe she had bronchitis and coughed and had copious amounts of bright red blood come out.  EMS also reviewed this as well 2.  States she feels better at this time.  Denies take any blood thinners.  She has not had any chest pain.  She denies being lightheaded or dizzy.  Presents via EMS       Home Medications Prior to Admission medications   Medication Sig Start Date End Date Taking? Authorizing Provider  aspirin 81 MG tablet Take 81 mg by mouth daily.    [provider]  B Complex-C-Folic Acid (SUPER B COMPLEX/FA/VIT C) TABS Take by mouth.    [provider]  cetirizine (ZYRTEC) 10 MG tablet Take 10 mg by mouth daily.    [provider]  Cholecalciferol (VITAMIN D-3) 1000 UNITS CAPS Take 2,000 Units by mouth daily.    [provider]  doxylamine, Sleep, (UNISOM) 25 MG tablet Take 25 mg by mouth at bedtime.    [provider]  escitalopram (LEXAPRO) 5 MG tablet Take 1 tablet (5 mg total) by mouth daily. 09/01/23   Fletcher Anon, NP  famotidine (PEPCID) 20 MG tablet Take 1 tablet (20 mg total) by mouth daily. 08/24/23   Fletcher Anon, NP  fluticasone (FLONASE) 50 MCG/ACT nasal spray Place 2 sprays into both nostrils daily. 08/24/23   Fletcher Anon, NP  levalbuterol (XOPENEX) 0.63 MG/3ML nebulizer solution Take 3 mLs (0.63 mg total) by nebulization every 6 (six) hours as needed for wheezing or shortness of breath. 09/01/23   Fletcher Anon, NP  metoprolol tartrate (LOPRESSOR) 25 MG tablet Take 1 tablet (25 mg total) by  mouth in the morning. 08/24/23   Fletcher Anon, NP  metoprolol tartrate (LOPRESSOR) 25 MG tablet Take 12.5 mg by mouth at bedtime. Hold if SBP< 90    [provider]  Multiple Vitamins-Minerals (PRESERVISION AREDS 2) CAPS Take 1 mg by mouth in the morning and at bedtime.    [provider]  nitroGLYCERIN (NITROSTAT) 0.4 MG SL tablet Place 0.4 mg under the tongue every 5 (five) minutes as needed for chest pain.    [provider]  OXYGEN Inhale 3 L into the lungs as directed. Every Shift; Shift 1, Shift 2, Shift 3    [provider]  potassium chloride (KLOR-CON M) 10 MEQ tablet Take 10 mEq by mouth daily. 09/04/23   [provider]  pravastatin (PRAVACHOL) 40 MG tablet Take 40 mg daily by mouth.  03/31/13   [provider]  Probiotic Product (ALIGN) 4 MG CAPS Take 4 mg by mouth daily. 1 capsule oral once a day    [provider]  torsemide (DEMADEX) 20 MG tablet Take 1 tablet (20 mg total) by mouth daily. 1 tablet once a morning 09/13/23   Romie Minus, MD      Allergies    Other, Codeine, Hydrocodone, and Neosporin [neomycin-bacitracin zn-polymyx]    Review of Systems   Review of Systems  All other systems reviewed and  are negative.   Physical Exam Updated Vital Signs BP 98/69   Pulse (!) 105   Temp 97.7 F (36.5 C)   Resp 19   Ht 1.651 m (5\' 5" )   Wt 55.3 kg   SpO2 100%   BMI 20.30 kg/m  Physical Exam Vitals and nursing note reviewed.  Constitutional:      General: She is not in acute distress.    Appearance: Normal appearance. She is well-developed. She is not toxic-appearing.  HENT:     Head: Normocephalic and atraumatic.  Eyes:     General: Lids are normal.     Conjunctiva/sclera: Conjunctivae normal.     Pupils: Pupils are equal, round, and reactive to light.  Neck:     Thyroid: No thyroid mass.     Trachea: No tracheal deviation.  Cardiovascular:     Rate and Rhythm: Normal rate and regular rhythm.      Heart sounds: Normal heart sounds. No murmur heard.    No gallop.  Pulmonary:     Effort: Pulmonary effort is normal. No respiratory distress.     Breath sounds: Normal breath sounds. No stridor. No decreased breath sounds, wheezing, rhonchi or rales.  Abdominal:     General: There is no distension.     Palpations: Abdomen is soft.     Tenderness: There is no abdominal tenderness. There is no rebound.  Musculoskeletal:        General: No tenderness. Normal range of motion.     Cervical back: Normal range of motion and neck supple.  Skin:    General: Skin is warm and dry.     Findings: No abrasion or rash.  Neurological:     Mental Status: She is alert and oriented to person, place, and time. Mental status is at baseline.     GCS: GCS eye subscore is 4. GCS verbal subscore is 5. GCS motor subscore is 6.     Cranial Nerves: No cranial nerve deficit.     Sensory: No sensory deficit.     Motor: Motor function is intact.  Psychiatric:        Attention and Perception: Attention normal.        Speech: Speech normal.        Behavior: Behavior normal.     ED Results / Procedures / Treatments   Labs (all labs ordered are listed, but only abnormal results are displayed) Labs Reviewed  CBC WITH DIFFERENTIAL/PLATELET  COMPREHENSIVE METABOLIC PANEL  TYPE AND SCREEN    EKG None  Radiology No results found.  Procedures Procedures    Medications Ordered in ED Medications  0.9 %  sodium chloride infusion (has no administration in time range)    ED Course/ Medical Decision Making/ A&P                                 Medical Decision Making Amount and/or Complexity of Data Reviewed Labs: ordered. Radiology: ordered.  Risk Prescription drug management. Decision regarding hospitalization.   Patient here with hemoptysis just prior to arrival.  History of lung cancer and concern for recurrence.  Chest x-ray per my interpretation shows no acute findings.  Subsequently  had a CT angio of her chest which showed a left central and lobar pulmonary embolus with evidence of right heart strain.  Due patient having a recent episode of large amount persists we will start heparin drip.  Discussed with hospitalist  and he will see the patient     Final Clinical Impression(s) / ED Diagnoses Final diagnoses:  None    Rx / DC Orders ED Discharge Orders     None         Lorre Nick, MD 02/07/24 1305    Lorre Nick, MD 02/07/24 1323    Lorre Nick, MD 02/07/24 1324

## 2024-02-07 NOTE — Progress Notes (Signed)
  Echocardiogram 2D Echocardiogram has been performed.  Leda Roys RDCS 02/07/2024, 3:14 PM

## 2024-02-07 NOTE — H&P (Signed)
History and Physical    [name] French WGN:[id number] DOB: 1926/10/17 DOA: 02/07/2024  PCP: Mahlon Gammon, MD   Chief Complaint: Hemoptysis Admitted from: [facility]  HPI: [name] French is a 89 y.o. female with medical history significant of right breast cancer status post lobectomy and chemotherapy, right non-small cell lung cancer status post radiation 2018, chronic left pleural effusion, hypertension, hyperlipidemia and former smoker who presents to the ED with acute episode of hemoptysis and coughing over the past few hours.  ED Course: Hemoglobin stable near baseline, CTA indicative of left central and lobar pulmonary artery embolus with evidence of right heart strain.  Review of Systems: As per HPI hemoptysis, coughing otherwise denies nausea vomiting diarrhea constipation headache fevers chills chest pain.   Assessment/Plan Principal Problem:   Acute pulmonary embolism, unspecified pulmonary embolism type, unspecified whether acute cor pulmonale present (HCC)    Acute PE, questionably provoked Hemoptysis, resolved Acute respiratory failure without hypoxia from baseline Chronic hypoxic respiratory failure -Patient continues on 2 L nasal cannula which is her baseline -Complicated respiratory history in the setting of non-small cell lung cancer, chronic pleural effusion status post chemo and radiation. -CTA as above indicative of left central and lobar pulmonary artery embolus with evidence of heart strain -Echo pending to formally evaluate degree of heart strain -Hold anticoagulation in the setting of hemoptysis, discussed risks and benefits of anticoagulation with the patient, she agrees that given active bleeding anticoagulation seems imprudent.  Certainly if her hemoptysis were to improve or resolve she may be a candidate for anticoagulation in the near future.  Chronic comorbid conditions, stable: Hypertension Hyperlipidemia Former tobacco abuse -At  baseline without complaint -Resume home medications once verified, patient reports only metoprolol although previous med rec also contains anxiolytics, diuretics, statin and inhalers.  Once verified will restart as appropriate.  Hold any anticoagulation/antiplatelet moving forward.  DVT prophylaxis: None given active bleeding as above, SCDs/TED hose Code Status: DNR, confirmed at bedside per patient Family Communication: Daughter updated per patient Status is: Observation  Dispo: The patient is from: [facility]              Anticipated d/c is to: Same              Anticipated d/c date is: 24 to 48 hours              Patient currently not medically stable for discharge given ongoing hemoptysis and newly diagnosed PE  Consultants:  None  Procedures:  None   Past Medical History:  Diagnosis Date   Allergic rhinitis    Anemia    during pregnancy   Arthritis    Breast cancer (HCC) 05/08/13   right upper inner, invasive mammary   Breast cancer, right breast (HCC) 04/16/2013   Underwent lumpectomy on 11/06/13. 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   Use of letrozole (  Femara)    neoadjuvant antiestrogen therapy with letrozole 2.5 mg daily x 7 monhts    Past Surgical History:  Procedure Laterality Date   BREAST BIOPSY Left 1960   lt br bx/benign   BREAST LUMPECTOMY Right    BREAST LUMPECTOMY Right 02/10/2020   Procedure: RIGHT BREAST LUMPECTOMY;  Surgeon: Abigail Miyamoto, MD;  Location: [facility] OR;  Service: General;  Laterality: Right;   BREAST  LUMPECTOMY WITH NEEDLE LOCALIZATION Bilateral 11/06/2013   Procedure: BREAST LUMPECTOMY WITH NEEDLE LOCALIZATION;  Surgeon: Currie Paris, MD;  Location: [facility];  Service: General;  Laterality: Bilateral;   COLONOSCOPY     EYE SURGERY Bilateral    both cataracts   MOHS SURGERY Right    nose basal/squamous   THORACENTESIS N/A 08/30/2023   Procedure: THORACENTESIS;  Surgeon: Luciano Cutter, MD;  Location: [facility] ENDOSCOPY;  Service: Pulmonary;  Laterality: N/A;   TOTAL HIP ARTHROPLASTY Left 08/31/2018   Procedure: LEFT TOTAL HIP ARTHROPLASTY ANTERIOR APPROACH;  Surgeon: Kathryne Hitch, MD;  Location: [facility] ORS;  Service: Orthopedics;  Laterality: Left;   VIDEO BRONCHOSCOPY WITH ENDOBRONCHIAL NAVIGATION N/A 11/01/2017   Procedure: VIDEO BRONCHOSCOPY WITH ENDOBRONCHIAL NAVIGATION;  Surgeon: Loreli Slot, MD;  Location: [facility] OR;  Service: Thoracic;  Laterality: N/A;   WRIST SURGERY  1990   lt     reports that she quit smoking about 45 years ago. Her smoking use included cigarettes. She started smoking about 70 years ago. She has a 12.5 pack-year smoking history. She has never used smokeless tobacco. She reports current alcohol use. She reports that she does not use drugs.  Allergies  Allergen Reactions   Other Anaphylaxis    Lobster- "years ago"   Codeine Itching    Insomnia    Hydrocodone Itching and Other (See Comments)    Insomnia   Neosporin [Neomycin-Bacitracin Zn-Polymyx] Rash    Family History  Problem Relation Age of Onset   Heart disease Mother    Prostate cancer Father    Lung cancer Sister     Prior to Admission medications   Medication Sig Start Date End Date Taking? Authorizing Provider  aspirin 81 MG tablet Take 81 mg by mouth daily.    [provider]  B Complex-C-Folic Acid (SUPER B COMPLEX/FA/VIT C) TABS Take by mouth.    [provider]  cetirizine (ZYRTEC) 10 MG tablet Take 10 mg by mouth daily.    [provider]  Cholecalciferol (VITAMIN D-3) 1000 UNITS CAPS Take 2,000 Units by mouth daily.    [provider]  doxylamine, Sleep, (UNISOM) 25 MG tablet Take 25 mg by mouth at bedtime.    [provider]  escitalopram (LEXAPRO) 5 MG tablet Take 1 tablet (5 mg total) by mouth daily. 09/01/23   Fletcher Anon, NP  famotidine (PEPCID) 20 MG tablet Take 1 tablet (20 mg total) by mouth daily. 08/24/23   Fletcher Anon, NP  fluticasone (FLONASE) 50 MCG/ACT nasal spray Place 2 sprays into both nostrils daily. 08/24/23   Fletcher Anon, NP  levalbuterol (XOPENEX) 0.63 MG/3ML nebulizer solution Take 3 mLs (0.63 mg total) by nebulization every 6 (six) hours as needed for wheezing or shortness of breath. 09/01/23   Fletcher Anon, NP  metoprolol tartrate (LOPRESSOR) 25 MG tablet Take 1 tablet (25 mg total) by mouth in the morning. 08/24/23   Fletcher Anon, NP  metoprolol tartrate (LOPRESSOR) 25 MG tablet Take 12.5 mg by mouth at bedtime. Hold if SBP< 90  [provider]  Multiple Vitamins-Minerals (PRESERVISION AREDS 2) CAPS Take 1 mg by mouth in the morning and at bedtime.    [provider]  nitroGLYCERIN (NITROSTAT) 0.4 MG SL tablet Place 0.4 mg under the tongue every 5 (five) minutes as needed for chest pain.    [provider]  OXYGEN Inhale 3 L into the lungs as directed. Every Shift; Shift 1, Shift 2, Shift 3    [provider]  potassium chloride (KLOR-CON M) 10 MEQ tablet Take 10 mEq by mouth daily. 09/04/23   [provider]  pravastatin (PRAVACHOL) 40 MG tablet Take 40 mg daily by mouth.  03/31/13   [provider]  Probiotic Product (ALIGN) 4 MG CAPS Take 4 mg by mouth daily. 1 capsule oral once a day    [provider]  torsemide (DEMADEX) 20 MG tablet Take 1 tablet (20 mg total) by mouth daily. 1 tablet once a morning 09/13/23   Romie Minus, MD    Physical Exam: Vitals:   02/07/24 1008 02/07/24 1010  02/07/24 1133  BP:  98/69 105/62  Pulse:  (!) 105 (!) 101  Resp:  19 (!) 21  Temp:  97.7 F (36.5 C) 98.5 F (36.9 C)  TempSrc:   Oral  SpO2:  100% 100%  Weight: 55.3 kg    Height: 5\' 5"  (1.651 m)      Constitutional: NAD, calm, comfortable Vitals:   02/07/24 1008 02/07/24 1010 02/07/24 1133  BP:  98/69 105/62  Pulse:  (!) 105 (!) 101  Resp:  19 (!) 21  Temp:  97.7 F (36.5 C) 98.5 F (36.9 C)  TempSrc:   Oral  SpO2:  100% 100%  Weight: 55.3 kg    Height: 5\' 5"  (1.651 m)     General:  Pleasantly resting in bed, No acute distress. HEENT:  Normocephalic atraumatic.  Sclerae nonicteric, noninjected.  Extraocular movements intact bilaterally. Neck:  Without mass or deformity.  Trachea is midline. Lungs:  Clear to auscultate bilaterally without rhonchi, wheeze, or rales. Heart:  Regular rate and rhythm.  Without murmurs, rubs, or gallops. Abdomen:  Soft, nontender, nondistended.  Without guarding or rebound. Extremities: Without cyanosis, clubbing, edema, or obvious deformity. Vascular:  Dorsalis pedis and posterior tibial pulses palpable bilaterally. Skin:  Warm and dry, no erythema, no ulcerations.  Labs on Admission: I have personally reviewed following labs and imaging studies  CBC: Recent Labs  Lab 02/07/24 1039  WBC 6.8  NEUTROABS 5.1  HGB 10.6*  HCT 34.1*  MCV 90.0  PLT 164   Basic Metabolic Panel: Recent Labs  Lab 02/07/24 1039  NA 140  K 4.1  CL 98  CO2 36*  GLUCOSE 100*  BUN 23  CREATININE 0.75  CALCIUM 9.5   GFR: Estimated Creatinine Clearance: 36.7 mL/min (by C-G formula based on SCr of 0.75 mg/dL). Liver Function Tests: Recent Labs  Lab 02/07/24 1039  AST 18  ALT 12  ALKPHOS 53  BILITOT 0.6  PROT 6.5  ALBUMIN 3.3*   No results for input(s): "LIPASE", "AMYLASE" in the last 168 hours. No results for input(s): "AMMONIA" in the last 168 hours. Coagulation Profile: No results for input(s): "INR", "PROTIME" in the last 168  hours. Cardiac Enzymes: No results for input(s): "CKTOTAL", "CKMB", "CKMBINDEX", "TROPONINI" in the last 168 hours. BNP (last 3 results) No results for input(s): "PROBNP" in the last 8760 hours. HbA1C: No results for input(s): "HGBA1C" in the last 72 hours. CBG: No results for  input(s): "GLUCAP" in the last 168 hours. Lipid Profile: No results for input(s): "CHOL", "HDL", "LDLCALC", "TRIG", "CHOLHDL", "LDLDIRECT" in the last 72 hours. Thyroid Function Tests: No results for input(s): "TSH", "T4TOTAL", "FREET4", "T3FREE", "THYROIDAB" in the last 72 hours. Anemia Panel: No results for input(s): "VITAMINB12", "FOLATE", "FERRITIN", "TIBC", "IRON", "RETICCTPCT" in the last 72 hours. Urine analysis:    Component Value Date/Time   COLORURINE STRAW (A) 02/06/2018 1702   APPEARANCEUR CLEAR 02/06/2018 1702   LABSPEC 1.001 (L) 02/06/2018 1702   PHURINE 7.0 02/06/2018 1702   GLUCOSEU NEGATIVE 02/06/2018 1702   HGBUR NEGATIVE 02/06/2018 1702   BILIRUBINUR NEGATIVE 02/06/2018 1702   KETONESUR 5 (A) 02/06/2018 1702   PROTEINUR NEGATIVE 02/06/2018 1702   NITRITE NEGATIVE 02/06/2018 1702   LEUKOCYTESUR NEGATIVE 02/06/2018 1702    Radiological Exams on Admission: CT Chest W Contrast Result Date: 02/07/2024 CLINICAL DATA:  Small cell lung cancer. EXAM: CT CHEST WITH CONTRAST TECHNIQUE: Multidetector CT imaging of the chest was performed during intravenous contrast administration. RADIATION DOSE REDUCTION: This exam was performed according to the departmental dose-optimization program which includes automated exposure control, adjustment of the mA and/or kV according to patient size and/or use of iterative reconstruction technique. CONTRAST:  75mL OMNIPAQUE IOHEXOL 300 MG/ML  SOLN COMPARISON:  Chest CT dated 01/25/2024. FINDINGS: Cardiovascular: There is no cardiomegaly or pericardial effusion. Coronary vascular calcification of the LAD. Mild atherosclerotic calcification of the thoracic aorta. No  aneurysmal dilatation or dissection. The origins of the great vessels of the aortic arch appear patent. There is a large pulmonary embolus in the left pulmonary artery extending into the lobar branches. This may represent a bland thrombus versus tumor thrombus. The RV/LV ratio is approximately 1.0. Mediastinum/Nodes: No obvious right hilar or mediastinal adenopathy. Evaluation of the left hilum is limited due to consolidative changes of the left lung. The esophagus is grossly unremarkable. No mediastinal fluid collection. Lungs/Pleura: Similar appearance of a loculated appearing left pleural effusion. Left perihilar consolidative changes of the majority of the left lower lobe with volume loss as seen on the prior CT. There is occlusion of the left lower lobe bronchi. A centrally occlusive mass is not excluded. Small right pleural effusion as seen previously.  No pneumothorax. Upper Abdomen: Gallstones. Musculoskeletal: Osteopenia. No acute osseous pathology. Asymmetric right retroareolar soft tissue nodule. IMPRESSION: 1. Left central and lobar pulmonary artery embolus with evidence of right heart straining. 2. Similar appearance of a loculated appearing left pleural effusion. 3. Left perihilar consolidation/mass with occlusion of the left lower lobe bronchi and complete left lower lobe collapse. 4. Small right pleural effusion as seen previously. 5. Cholelithiasis. 6.  Aortic Atherosclerosis (ICD10-I70.0). These results were called by telephone at the time of interpretation on 02/07/2024 at 12:59 pm to Dr. Lorre Nick , who verbally acknowledged these results. Electronically Signed   By: Elgie Collard M.D.   On: 02/07/2024 13:00   DG Chest Port 1 View Result Date: 02/07/2024 CLINICAL DATA:  Cough. EXAM: PORTABLE CHEST 1 VIEW COMPARISON:  CT scan chest from 01/25/2024. FINDINGS: Essentially stable exam. Redemonstration of left lower hemithorax opacification which corresponds to left pleural effusion and  associated left lung compressive atelectasis. There is linear opacity along the right mid lung zone, which corresponds to atelectasis/scarring along the minor fissure, unchanged. Bilateral lung fields are otherwise clear. Right lateral costophrenic angle is clear. Evaluation of cardiomediastinal silhouette is nondiagnostic due to left lower hemithorax opacification. No acute osseous abnormalities. The soft tissues are within normal limits. IMPRESSION: *  Essentially stable exam, as described above. No acute cardiopulmonary abnormality. *Persistent left lower hemithorax opacification. Electronically Signed   By: Jules Schick M.D.   On: 02/07/2024 11:12    EKG: Independently reviewed.    Azucena Fallen DO [facility] For contact please use secure messenger on Epic  If 7PM-7AM, please contact night-coverage located on [email or web address]   02/07/2024, 1:38 PM

## 2024-02-07 NOTE — ED Triage Notes (Addendum)
Patient BIB [facility] from [facility] facility. Coughed up blood clots this morning. Feels like she has bronchitis. History of lung cancer. Baseline 2 liters oxygen nasal cannula. No blood thinners.

## 2024-02-08 DIAGNOSIS — I2699 Other pulmonary embolism without acute cor pulmonale: Secondary | ICD-10-CM | POA: Diagnosis present

## 2024-02-08 DIAGNOSIS — E785 Hyperlipidemia, unspecified: Secondary | ICD-10-CM | POA: Diagnosis present

## 2024-02-08 DIAGNOSIS — Z923 Personal history of irradiation: Secondary | ICD-10-CM | POA: Diagnosis not present

## 2024-02-08 DIAGNOSIS — J9621 Acute and chronic respiratory failure with hypoxia: Secondary | ICD-10-CM | POA: Diagnosis present

## 2024-02-08 DIAGNOSIS — Z66 Do not resuscitate: Secondary | ICD-10-CM | POA: Diagnosis present

## 2024-02-08 DIAGNOSIS — Z7982 Long term (current) use of aspirin: Secondary | ICD-10-CM | POA: Diagnosis not present

## 2024-02-08 DIAGNOSIS — Z79811 Long term (current) use of aromatase inhibitors: Secondary | ICD-10-CM | POA: Diagnosis not present

## 2024-02-08 DIAGNOSIS — Z87891 Personal history of nicotine dependence: Secondary | ICD-10-CM | POA: Diagnosis not present

## 2024-02-08 DIAGNOSIS — Z9221 Personal history of antineoplastic chemotherapy: Secondary | ICD-10-CM | POA: Diagnosis not present

## 2024-02-08 DIAGNOSIS — Z885 Allergy status to narcotic agent status: Secondary | ICD-10-CM | POA: Diagnosis not present

## 2024-02-08 DIAGNOSIS — Z9981 Dependence on supplemental oxygen: Secondary | ICD-10-CM | POA: Diagnosis not present

## 2024-02-08 DIAGNOSIS — Z79899 Other long term (current) drug therapy: Secondary | ICD-10-CM | POA: Diagnosis not present

## 2024-02-08 DIAGNOSIS — Z85118 Personal history of other malignant neoplasm of bronchus and lung: Secondary | ICD-10-CM | POA: Diagnosis not present

## 2024-02-08 DIAGNOSIS — Z8249 Family history of ischemic heart disease and other diseases of the circulatory system: Secondary | ICD-10-CM | POA: Diagnosis not present

## 2024-02-08 DIAGNOSIS — Z85828 Personal history of other malignant neoplasm of skin: Secondary | ICD-10-CM | POA: Diagnosis not present

## 2024-02-08 DIAGNOSIS — I2609 Other pulmonary embolism with acute cor pulmonale: Secondary | ICD-10-CM | POA: Diagnosis present

## 2024-02-08 DIAGNOSIS — Z853 Personal history of malignant neoplasm of breast: Secondary | ICD-10-CM | POA: Diagnosis not present

## 2024-02-08 DIAGNOSIS — R042 Hemoptysis: Secondary | ICD-10-CM | POA: Diagnosis present

## 2024-02-08 DIAGNOSIS — I10 Essential (primary) hypertension: Secondary | ICD-10-CM | POA: Diagnosis present

## 2024-02-08 DIAGNOSIS — Z96642 Presence of left artificial hip joint: Secondary | ICD-10-CM | POA: Diagnosis present

## 2024-02-08 LAB — ECHOCARDIOGRAM COMPLETE
AR max vel: 0.72 cm2
AV Area VTI: 0.84 cm2
AV Area mean vel: 0.78 cm2
AV Mean grad: 6 mm[Hg]
AV Peak grad: 10.6 mm[Hg]
Ao pk vel: 1.63 m/s
Height: 65 in
P 1/2 time: 206 ms
S' Lateral: 2.65 cm
Weight: 1952 [oz_av]

## 2024-02-08 LAB — CBC
HCT: 32.6 % — ABNORMAL LOW (ref 36.0–46.0)
HCT: 33.1 % — ABNORMAL LOW (ref 36.0–46.0)
Hemoglobin: 9.8 g/dL — ABNORMAL LOW (ref 12.0–15.0)
Hemoglobin: 10.1 g/dL — ABNORMAL LOW (ref 12.0–15.0)
MCH: 27.9 pg (ref 26.0–34.0)
MCH: 27.9 pg (ref 26.0–34.0)
MCHC: 30.1 g/dL (ref 30.0–36.0)
MCHC: 30.5 g/dL (ref 30.0–36.0)
MCV: 92.9 fL (ref 80.0–100.0)
MCV: 91.4 fL (ref 80.0–100.0)
Platelets: 145 10*3/uL — ABNORMAL LOW (ref 150–400)
Platelets: 165 10*3/uL (ref 150–400)
RBC: 3.51 MIL/uL — ABNORMAL LOW (ref 3.87–5.11)
RBC: 3.62 MIL/uL — ABNORMAL LOW (ref 3.87–5.11)
RDW: 16 % — ABNORMAL HIGH (ref 11.5–15.5)
RDW: 15.9 % — ABNORMAL HIGH (ref 11.5–15.5)
WBC: 5.4 10*3/uL (ref 4.0–10.5)
WBC: 5.8 10*3/uL (ref 4.0–10.5)
nRBC: 0 % (ref 0.0–0.2)
nRBC: 0 % (ref 0.0–0.2)

## 2024-02-08 LAB — ABO/RH: ABO/RH(D): A POS

## 2024-02-08 MED ORDER — ESCITALOPRAM OXALATE 10 MG PO TABS
5.0000 mg | ORAL_TABLET | Freq: Every day | ORAL | Status: DC
  Administered 2024-02-08 – 2024-02-09 (×2): 5 mg via ORAL
  Filled 2024-02-08 (×2): qty 1

## 2024-02-08 MED ORDER — ALBUTEROL SULFATE (2.5 MG/3ML) 0.083% IN NEBU: 2.5000 mg | INHALATION_SOLUTION | Freq: Four times a day (QID) | RESPIRATORY_TRACT | Status: DC | PRN

## 2024-02-08 MED ORDER — TRANEXAMIC ACID FOR INHALATION
500.0000 mg | Freq: Three times a day (TID) | RESPIRATORY_TRACT | Status: DC
  Administered 2024-02-08 (×2): 500 mg via RESPIRATORY_TRACT
  Filled 2024-02-08 (×3): qty 10

## 2024-02-08 MED ORDER — METOPROLOL TARTRATE 25 MG PO TABS
12.5000 mg | ORAL_TABLET | Freq: Two times a day (BID) | ORAL | Status: DC
  Administered 2024-02-08 – 2024-02-09 (×2): 12.5 mg via ORAL
  Filled 2024-02-08 (×2): qty 1

## 2024-02-08 NOTE — Progress Notes (Signed)
Patient vomiting large amounts of bright red blood with noticeable clots. She had just finished eating breakfast, 100%. No complaints of pain at this time. Oxygen saturation 79% on 2L/[place]. O2 increased to 5L, sats 92%. MD present at bedside to assess.

## 2024-02-08 NOTE — Significant Event (Signed)
Case and plan of care discussed with attending physician.  This is a 89 year old woman with prior history of lung cancer status post radiation.  Left lower lobe collapse, chronic, presumably postradiation changes.  Reviewed images show stable chronic left lower lobe collapse, chronic PE with a eccentric filling defect although acute PE was reported on CT scan this admission, clear left upper lobe, clear right lung.  Report hemoptysis.  Source of amount is almost certainly left lower lobe given post radiation changes etc.  Lack of other evidence as location elsewhere.  The bronchus is occluded.  Bronchoscopy is likely futile given this occlusion.  Furthermore, hemoptysis is almost universally unable to be controlled via bronchoscopy.  Bronchoscopy may help to localize source but the source looks pretty clear here.  I have no tools to recently stop any hemoptysis.  For this reason I recommend no bronchoscopy.  Discussed in detail with attending physician.

## 2024-02-08 NOTE — Care Management Obs Status (Signed)
MEDICARE OBSERVATION STATUS NOTIFICATION   Patient Details  Name: BAYAN HEDSTROM MRN: [id number] Date of Birth: [birth year]   Medicare Observation Status Notification Given:  Yes    Beckie Busing, RN 02/08/2024, 11:03 AM

## 2024-02-08 NOTE — Plan of Care (Signed)

## 2024-02-08 NOTE — Progress Notes (Addendum)
PROGRESS NOTE    [name] French  ZOX:[id number] DOB: 02-05-1926 DOA: 02/07/2024 PCP: Mahlon Gammon, MD   Brief Narrative:  [name] French is a 89 y.o. female with medical history significant of right breast cancer status post lobectomy and chemotherapy, right non-small cell lung cancer status post radiation 2018, chronic left pleural effusion, hypertension, hyperlipidemia and former smoker who presents to the ED with acute episode of hemoptysis.    Assessment & Plan:   Principal Problem:   Acute pulmonary embolism, unspecified pulmonary embolism type, unspecified whether acute cor pulmonale present (HCC)   Acute on likely chronic PE, questionably provoked Hemoptysis, ongoing Acute respiratory failure without hypoxia from baseline, resolved Chronic hypoxic respiratory failure -Patient continues on 2 L nasal cannula which is her baseline -Complicated respiratory history in the setting of non-small cell lung cancer, chronic pleural effusion status post chemo and radiation. -CTA as above indicative of left central and lobar pulmonary artery embolus with evidence of heart strain - appears to be visible on CTA from last month as well as prior imaging in 2024. -Echo pending to formally evaluate degree of heart strain -Given ongoing hemoptysis will hold anticoagulation - appreciate PCCM assistance w/ bleeding - TXA nebs ongoing q8h. Contacted IR for possible arterial embolization, appreciate their insight/knowledge. -Patient and I had a long discussion and she would not want any "aggressive" measures including surgery/resuscitation as below. She is amenable to intravascular procedures if necessary. **Update - still having hemoptysis this afternoon - discussed case with IR/PCCM - repeat CBC to ensure no worsening anemia/thrombocytopenia. Will transfuse if appropriate. No indication at this time for bronch/vascular intervention.   Chronic comorbid conditions,  stable: Hypertension Hyperlipidemia Former tobacco abuse -At baseline without complaint -Continue home medications as below   DVT prophylaxis: SCDs Start: 02/07/24 1337 Place TED hose Start: 02/07/24 1337   Code Status:   Code Status: Limited: Do not attempt resuscitation (DNR) -DNR-LIMITED -Do Not Intubate/DNI   Family Communication: None present - patient wants to update family on her own as they are traveling  Status is: Inpt  Dispo: The patient is from: Home              Anticipated d/c is to: Home              Anticipated d/c date is: 24-48h              Patient currently not medically stable for discharge  Consultants:  IR/PCCM sidelined as above  Procedures:  None  Antimicrobials:  None   Subjective: No acute issues/events overnight- this am acute onset hemoptysis with coughing  Objective: Vitals:   02/08/24 0606 02/08/24 1310 02/08/24 1406 02/08/24 1408  BP: 106/60 (!) 101/48    Pulse: (!) 105 (!) 110    Resp: 18 16    Temp: 97.9 F (36.6 C) (!) 97.4 F (36.3 C)    TempSrc: Oral Oral    SpO2: 97% 100% 98% 98%  Weight:      Height:        Intake/Output Summary (Last 24 hours) at 02/08/2024 1455 Last data filed at 02/08/2024 0915 Gross per 24 hour  Intake 1332.08 ml  Output --  Net 1332.08 ml   Filed Weights   02/07/24 1008  Weight: 55.3 kg    Examination:  General:  Pleasantly resting in bed, No acute distress. HEENT:  Normocephalic atraumatic.  Sclerae nonicteric, noninjected.  Extraocular movements intact bilaterally. Neck:  Without mass or deformity.  Trachea is midline.  Lungs:  Clear to auscultate bilaterally without rhonchi, wheeze, or rales. Heart:  Regular rate and rhythm.  Without murmurs, rubs, or gallops. Abdomen:  Soft, nontender, nondistended.  Without guarding or rebound. Extremities: Without cyanosis, clubbing, edema, or obvious deformity. Skin:  Warm and dry, no erythema.  Data Reviewed: I have personally reviewed following  labs and imaging studies  CBC: Recent Labs  Lab 02/07/24 1039 02/08/24 0553  WBC 6.8 5.4  NEUTROABS 5.1  --   HGB 10.6* 9.8*  HCT 34.1* 32.6*  MCV 90.0 92.9  PLT 164 145*   Basic Metabolic Panel: Recent Labs  Lab 02/07/24 1039  NA 140  K 4.1  CL 98  CO2 36*  GLUCOSE 100*  BUN 23  CREATININE 0.75  CALCIUM 9.5   GFR: Estimated Creatinine Clearance: 36.7 mL/min (by C-G formula based on SCr of 0.75 mg/dL).  Liver Function Tests: Recent Labs  Lab 02/07/24 1039  AST 18  ALT 12  ALKPHOS 53  BILITOT 0.6  PROT 6.5  ALBUMIN 3.3*    No results found for this or any previous visit (from the past 240 hours).   Radiology Studies: ECHOCARDIOGRAM COMPLETE Result Date: 02/08/2024    ECHOCARDIOGRAM REPORT   Patient Name:   [name] French Mey Date of Exam: 02/07/2024 Medical Rec #:  [id number]        Height:       65.0 in Accession #:    [id number]       Weight:       122.0 lb Date of Birth:  July 18, 1926        BSA:          1.603 m Patient Age:    89 years         BP:           120/69 mmHg Patient Gender: F                HR:           106 bpm. Exam Location:  Inpatient Procedure: 2D Echo, Color Doppler and Cardiac Doppler (Both Spectral and Color            Flow Doppler were utilized during procedure). Indications:    Pulmonary Embolus  History:        Patient has prior history of Echocardiogram examinations, most                 recent 05/29/2020.  Sonographer:    Harriette Bouillon RDCS Referring Phys: [id number] Azucena Fallen IMPRESSIONS  1. Left ventricular ejection fraction, by estimation, is 65 to 70%. The left ventricle has normal function. The left ventricle has no regional wall motion abnormalities. Indeterminate diastolic filling due to E-A fusion.  2. Right ventricular systolic function is normal. The right ventricular size is normal. There is mildly elevated pulmonary artery systolic pressure. The estimated right ventricular systolic pressure is 40.7 mmHg.  3. Left atrial size was  mildly dilated.  4. Right atrial size was mildly dilated.  5. The mitral valve is degenerative. Mild to moderate mitral valve regurgitation. No evidence of mitral stenosis.  6. Tricuspid valve regurgitation is mild to moderate.  7. The aortic valve is abnormal. There is mild calcification of the aortic valve. There is moderate thickening of the aortic valve. Aortic valve regurgitation is mild. Aortic valve sclerosis/calcification is present, without any evidence of aortic stenosis. DVI 0.54, SVI 15.  8. The inferior vena cava is normal in size with greater than  50% respiratory variability, suggesting right atrial pressure of 3 mmHg. FINDINGS  Left Ventricle: Left ventricular ejection fraction, by estimation, is 65 to 70%. The left ventricle has normal function. The left ventricle has no regional wall motion abnormalities. The left ventricular internal cavity size was normal in size. There is  no left ventricular hypertrophy. Indeterminate diastolic filling due to E-A fusion. Right Ventricle: The right ventricular size is normal. No increase in right ventricular wall thickness. Right ventricular systolic function is normal. There is mildly elevated pulmonary artery systolic pressure. The tricuspid regurgitant velocity is 3.07  m/s, and with an assumed right atrial pressure of 3 mmHg, the estimated right ventricular systolic pressure is 40.7 mmHg. Left Atrium: Left atrial size was mildly dilated. Right Atrium: Right atrial size was mildly dilated. Pericardium: There is no evidence of pericardial effusion. Mitral Valve: The mitral valve is degenerative in appearance. Mild to moderate mitral valve regurgitation. No evidence of mitral valve stenosis. The mean mitral valve gradient is 4.2 mmHg with average heart rate of 102 bpm. Tricuspid Valve: The tricuspid valve is normal in structure. Tricuspid valve regurgitation is mild to moderate. No evidence of tricuspid stenosis. Aortic Valve: The aortic valve is abnormal. There  is mild calcification of the aortic valve. There is moderate thickening of the aortic valve. Aortic valve regurgitation is mild. Aortic regurgitation PHT measures 206 msec. Aortic valve sclerosis/calcification is present, without any evidence of aortic stenosis. Aortic valve mean gradient measures 6.0 mmHg. Aortic valve peak gradient measures 10.6 mmHg. Aortic valve area, by VTI measures 0.84 cm. Pulmonic Valve: The pulmonic valve was normal in structure. Pulmonic valve regurgitation is not visualized. No evidence of pulmonic stenosis. Aorta: The aortic root is normal in size and structure. Venous: The inferior vena cava is normal in size with greater than 50% respiratory variability, suggesting right atrial pressure of 3 mmHg. IAS/Shunts: The interatrial septum was not well visualized.  LEFT VENTRICLE PLAX 2D LVIDd:         4.30 cm LVIDs:         2.65 cm LV PW:         0.70 cm LV IVS:        0.70 cm LVOT diam:     1.40 cm LV SV:         24 LV SV Index:   15 LVOT Area:     1.54 cm  RIGHT VENTRICLE             IVC RV S prime:     10.10 cm/s  IVC diam: 1.90 cm TAPSE (M-mode): 1.7 cm LEFT ATRIUM             Index        RIGHT ATRIUM           Index LA diam:        3.40 cm 2.12 cm/m   RA Area:     11.90 cm LA Vol (A2C):   26.5 ml 16.53 ml/m  RA Volume:   26.90 ml  16.78 ml/m LA Vol (A4C):   49.3 ml 30.75 ml/m LA Biplane Vol: 36.3 ml 22.64 ml/m  AORTIC VALVE AV Area (Vmax):    0.72 cm AV Area (Vmean):   0.78 cm AV Area (VTI):     0.84 cm AV Vmax:           162.88 cm/s AV Vmean:          116.411 cm/s AV VTI:  0.289 m AV Peak Grad:      10.6 mmHg AV Mean Grad:      6.0 mmHg LVOT Vmax:         76.00 cm/s LVOT Vmean:        59.000 cm/s LVOT VTI:          0.157 m LVOT/AV VTI ratio: 0.54 AI PHT:            206 msec  AORTA Ao Root diam: 2.90 cm Ao Asc diam:  2.20 cm MITRAL VALVE              TRICUSPID VALVE MV Mean grad: 4.2 mmHg    TR Peak grad:   37.7 mmHg MV E velocity: 4.57 cm/s  TR Vmax:        307.00  cm/s                            SHUNTS                           Systemic VTI:  0.16 m                           Systemic Diam: 1.40 cm Weston Brass MD Electronically signed by Weston Brass MD Signature Date/Time: 02/08/2024/12:19:37 PM    Final    CT Chest W Contrast Result Date: 02/07/2024 CLINICAL DATA:  Small cell lung cancer. EXAM: CT CHEST WITH CONTRAST TECHNIQUE: Multidetector CT imaging of the chest was performed during intravenous contrast administration. RADIATION DOSE REDUCTION: This exam was performed according to the departmental dose-optimization program which includes automated exposure control, adjustment of the mA and/or kV according to patient size and/or use of iterative reconstruction technique. CONTRAST:  75mL OMNIPAQUE IOHEXOL 300 MG/ML  SOLN COMPARISON:  Chest CT dated 01/25/2024. FINDINGS: Cardiovascular: There is no cardiomegaly or pericardial effusion. Coronary vascular calcification of the LAD. Mild atherosclerotic calcification of the thoracic aorta. No aneurysmal dilatation or dissection. The origins of the great vessels of the aortic arch appear patent. There is a large pulmonary embolus in the left pulmonary artery extending into the lobar branches. This may represent a bland thrombus versus tumor thrombus. The RV/LV ratio is approximately 1.0. Mediastinum/Nodes: No obvious right hilar or mediastinal adenopathy. Evaluation of the left hilum is limited due to consolidative changes of the left lung. The esophagus is grossly unremarkable. No mediastinal fluid collection. Lungs/Pleura: Similar appearance of a loculated appearing left pleural effusion. Left perihilar consolidative changes of the majority of the left lower lobe with volume loss as seen on the prior CT. There is occlusion of the left lower lobe bronchi. A centrally occlusive mass is not excluded. Small right pleural effusion as seen previously.  No pneumothorax. Upper Abdomen: Gallstones. Musculoskeletal: Osteopenia.  No acute osseous pathology. Asymmetric right retroareolar soft tissue nodule. IMPRESSION: 1. Left central and lobar pulmonary artery embolus with evidence of right heart straining. 2. Similar appearance of a loculated appearing left pleural effusion. 3. Left perihilar consolidation/mass with occlusion of the left lower lobe bronchi and complete left lower lobe collapse. 4. Small right pleural effusion as seen previously. 5. Cholelithiasis. 6.  Aortic Atherosclerosis (ICD10-I70.0). These results were called by telephone at the time of interpretation on 02/07/2024 at 12:59 pm to Dr. Lorre Nick , who verbally acknowledged these results. Electronically Signed   By: Ceasar Mons.D.  On: 02/07/2024 13:00   DG Chest Port 1 View Result Date: 02/07/2024 CLINICAL DATA:  Cough. EXAM: PORTABLE CHEST 1 VIEW COMPARISON:  CT scan chest from 01/25/2024. FINDINGS: Essentially stable exam. Redemonstration of left lower hemithorax opacification which corresponds to left pleural effusion and associated left lung compressive atelectasis. There is linear opacity along the right mid lung zone, which corresponds to atelectasis/scarring along the minor fissure, unchanged. Bilateral lung fields are otherwise clear. Right lateral costophrenic angle is clear. Evaluation of cardiomediastinal silhouette is nondiagnostic due to left lower hemithorax opacification. No acute osseous abnormalities. The soft tissues are within normal limits. IMPRESSION: *Essentially stable exam, as described above. No acute cardiopulmonary abnormality. *Persistent left lower hemithorax opacification. Electronically Signed   By: Jules Schick M.D.   On: 02/07/2024 11:12   Scheduled Meds:  tranexamic acid  500 mg Nebulization Q8H   Continuous Infusions:   LOS: 0 days   Time spent:   Azucena Fallen, DO [facility]  If 7PM-7AM, please contact night-coverage [email or web address]  02/08/2024, 2:55 PM

## 2024-02-08 NOTE — Plan of Care (Signed)
  Problem: Education: Goal: Knowledge of General Education information will improve Description Including pain rating scale, medication(s)/side effects and non-pharmacologic comfort measures Outcome: Progressing   Problem: Nutrition: Goal: Adequate nutrition will be maintained Outcome: Progressing   Problem: Coping: Goal: Level of anxiety will decrease Outcome: Progressing   Problem: Elimination: Goal: Will not experience complications related to urinary retention Outcome: Progressing   Problem: Safety: Goal: Ability to remain free from injury will improve Outcome: Progressing   Problem: Skin Integrity: Goal: Risk for impaired skin integrity will decrease Outcome: Progressing

## 2024-02-09 DIAGNOSIS — I2699 Other pulmonary embolism without acute cor pulmonale: Secondary | ICD-10-CM | POA: Diagnosis not present

## 2024-02-09 LAB — CBC
HCT: 32.9 % — ABNORMAL LOW (ref 36.0–46.0)
Hemoglobin: 9.8 g/dL — ABNORMAL LOW (ref 12.0–15.0)
MCH: 27.8 pg (ref 26.0–34.0)
MCHC: 29.8 g/dL — ABNORMAL LOW (ref 30.0–36.0)
MCV: 93.2 fL (ref 80.0–100.0)
Platelets: 160 10*3/uL (ref 150–400)
RBC: 3.53 MIL/uL — ABNORMAL LOW (ref 3.87–5.11)
RDW: 15.7 % — ABNORMAL HIGH (ref 11.5–15.5)
WBC: 4.6 10*3/uL (ref 4.0–10.5)
nRBC: 0 % (ref 0.0–0.2)

## 2024-02-09 MED ORDER — TORSEMIDE 20 MG PO TABS: 10.0000 mg | ORAL_TABLET | Freq: Every day | ORAL | Status: DC

## 2024-02-09 MED ORDER — TRANEXAMIC ACID FOR INHALATION
500.0000 mg | Freq: Three times a day (TID) | RESPIRATORY_TRACT | Status: DC
  Administered 2024-02-09 (×2): 500 mg via RESPIRATORY_TRACT
  Filled 2024-02-09 (×3): qty 10

## 2024-02-09 MED ORDER — METOPROLOL TARTRATE 25 MG PO TABS: 12.5000 mg | ORAL_TABLET | Freq: Two times a day (BID) | ORAL | 0 refills | Status: AC

## 2024-02-09 NOTE — Progress Notes (Signed)
Report called to [facility] per this nurse, transferred by receptionist with no answer to 2nd floor living facility.

## 2024-02-09 NOTE — Discharge Summary (Signed)
Physician Discharge Summary  [name] French OZD:[id number] DOB: Apr 16, 1926 DOA: 02/07/2024  PCP: [name] Gammon, MD  Admit date: 02/07/2024 Discharge date: 02/09/2024  Admitted From: Assisted living Disposition: Same  Recommendations for Outpatient Follow-up:  Follow up with PCP in 1-2 weeks  Home Health: None Equipment/Devices: None  Discharge Condition: Stable CODE STATUS: DNR Diet recommendation: As tolerated  Brief/Interim Summary: [name] French is a 89 y.o. female with medical history significant of right breast cancer status post lobectomy and chemotherapy, right non-small cell lung cancer status post radiation 2018, chronic left pleural effusion, hypertension, hyperlipidemia and former smoker who presents to the ED with acute episode of hemoptysis.  Patient admitted as above with acute onset hemoptysis.  Hemoglobin remained remarkably stable, patient had repeat episode of hemoptysis on 2/20, discussed case with pulmonology and interventional radiology who recommended TXA nebs.  Patient received 3 neb treatments during hospitalization with resolution of bleeding.  At intake patient had CTA concerning for acute PE however on further review patient has had filling defect in this area (on the left near previous cancer and radiation treatment) for at least 1 year given it was visualized on CT in 2024.  At this time patient's hemoptysis is likely secondary to her history of cancer, radiation and fibrosis and not related to this chronic thrombus.  Discussed anticoagulation with the patient and given her high risk of bleeding we decided to hold off further anticoagulation given she has had this filling defect now for a year with no incidence and given contrast around the thrombus she appears to have revascularized that area.  At this time given stable hemoglobin, resolution of bleeding and symptoms patient otherwise stable and agreeable for discharge back to assisted living  facility.  Discharge Diagnoses:  Principal Problem:   Acute pulmonary embolism, unspecified pulmonary embolism type, unspecified whether acute cor pulmonale present [facility]) Active Problems:   Hemoptysis    Discharge Instructions  Discharge Instructions     Call MD for:  difficulty breathing, headache or visual disturbances   Complete by: As directed    Call MD for:  extreme fatigue   Complete by: As directed    Call MD for:  persistant dizziness or light-headedness   Complete by: As directed    Call MD for:  persistant nausea and vomiting   Complete by: As directed    Call MD for:  redness, tenderness, or signs of infection (pain, swelling, redness, odor or green/yellow discharge around incision site)   Complete by: As directed    Call MD for:  severe uncontrolled pain   Complete by: As directed    Diet - low sodium heart healthy   Complete by: As directed    Increase activity slowly   Complete by: As directed       Allergies as of 02/09/2024       Reactions   Other Anaphylaxis   Lobster- "years ago"   Codeine Itching   Insomnia    Hydrocodone Itching, Other (See Comments)   Insomnia   Neosporin [neomycin-bacitracin Zn-polymyx] Rash        Medication List     STOP taking these medications    Align 4 MG Caps   doxylamine (Sleep) 25 MG tablet Commonly known as: UNISOM       TAKE these medications    cetirizine 10 MG tablet Commonly known as: ZYRTEC Take 10 mg by mouth daily.   escitalopram 5 MG tablet Commonly known as: Lexapro Take 1 tablet (5  mg total) by mouth daily.   famotidine 20 MG tablet Commonly known as: Pepcid Take 1 tablet (20 mg total) by mouth daily.   fluticasone 50 MCG/ACT nasal spray Commonly known as: FLONASE Place 2 sprays into both nostrils daily.   levalbuterol 0.63 MG/3ML nebulizer solution Commonly known as: Xopenex Take 3 mLs (0.63 mg total) by nebulization every 6 (six) hours as needed for wheezing or shortness of  breath.   metoprolol tartrate 25 MG tablet Commonly known as: LOPRESSOR Take 0.5 tablets (12.5 mg total) by mouth 2 (two) times daily. What changed:  how much to take when to take this Another medication with the same name was removed. Continue taking this medication, and follow the directions you see here.   nitroGLYCERIN 0.4 MG SL tablet Commonly known as: NITROSTAT Place 0.4 mg under the tongue every 5 (five) minutes as needed for chest pain.   OXYGEN Inhale 3 L into the lungs as directed. Every Shift; Shift 1, Shift 2, Shift 3   potassium chloride 10 MEQ tablet Commonly known as: KLOR-CON M Take 10 mEq by mouth daily.   pravastatin 40 MG tablet Commonly known as: PRAVACHOL Take 40 mg daily by mouth.   PreserVision AREDS 2 Caps Take 1 mg by mouth in the morning and at bedtime.   Super B Complex/FA/Vit C Tabs Take by mouth.   torsemide 20 MG tablet Commonly known as: DEMADEX Take 0.5 tablets (10 mg total) by mouth daily. 1 tablet once a morning What changed: how much to take   Vitamin D-3 25 MCG (1000 UT) Caps Take 2,000 Units by mouth daily.        Allergies  Allergen Reactions   Other Anaphylaxis    Lobster- "years ago"   Codeine Itching    Insomnia    Hydrocodone Itching and Other (See Comments)    Insomnia   Neosporin [Neomycin-Bacitracin Zn-Polymyx] Rash    Consultations: Pulmonology and interventional radiology sidelined, no formal consult  Procedures/Studies: ECHOCARDIOGRAM COMPLETE Result Date: 02/08/2024    ECHOCARDIOGRAM REPORT   Patient Name:   [name] French Date of Exam: 02/07/2024 Medical Rec #:  [id number]        Height:       65.0 in Accession #:    [id number]       Weight:       122.0 lb Date of Birth:  1926/08/12        BSA:          1.603 m Patient Age:    89 years         BP:           120/69 mmHg Patient Gender: F                HR:           106 bpm. Exam Location:  Inpatient Procedure: 2D Echo, Color Doppler and Cardiac Doppler (Both  Spectral and Color            Flow Doppler were utilized during procedure). Indications:    Pulmonary Embolus  History:        Patient has prior history of Echocardiogram examinations, most                 recent 05/29/2020.  Sonographer:    Harriette Bouillon RDCS Referring Phys: [id number] Azucena Fallen IMPRESSIONS  1. Left ventricular ejection fraction, by estimation, is 65 to 70%. The left ventricle has normal function. The left ventricle has  no regional wall motion abnormalities. Indeterminate diastolic filling due to E-A fusion.  2. Right ventricular systolic function is normal. The right ventricular size is normal. There is mildly elevated pulmonary artery systolic pressure. The estimated right ventricular systolic pressure is 40.7 mmHg.  3. Left atrial size was mildly dilated.  4. Right atrial size was mildly dilated.  5. The mitral valve is degenerative. Mild to moderate mitral valve regurgitation. No evidence of mitral stenosis.  6. Tricuspid valve regurgitation is mild to moderate.  7. The aortic valve is abnormal. There is mild calcification of the aortic valve. There is moderate thickening of the aortic valve. Aortic valve regurgitation is mild. Aortic valve sclerosis/calcification is present, without any evidence of aortic stenosis. DVI 0.54, SVI 15.  8. The inferior vena cava is normal in size with greater than 50% respiratory variability, suggesting right atrial pressure of 3 mmHg. FINDINGS  Left Ventricle: Left ventricular ejection fraction, by estimation, is 65 to 70%. The left ventricle has normal function. The left ventricle has no regional wall motion abnormalities. The left ventricular internal cavity size was normal in size. There is  no left ventricular hypertrophy. Indeterminate diastolic filling due to E-A fusion. Right Ventricle: The right ventricular size is normal. No increase in right ventricular wall thickness. Right ventricular systolic function is normal. There is mildly elevated  pulmonary artery systolic pressure. The tricuspid regurgitant velocity is 3.07  m/s, and with an assumed right atrial pressure of 3 mmHg, the estimated right ventricular systolic pressure is 40.7 mmHg. Left Atrium: Left atrial size was mildly dilated. Right Atrium: Right atrial size was mildly dilated. Pericardium: There is no evidence of pericardial effusion. Mitral Valve: The mitral valve is degenerative in appearance. Mild to moderate mitral valve regurgitation. No evidence of mitral valve stenosis. The mean mitral valve gradient is 4.2 mmHg with average heart rate of 102 bpm. Tricuspid Valve: The tricuspid valve is normal in structure. Tricuspid valve regurgitation is mild to moderate. No evidence of tricuspid stenosis. Aortic Valve: The aortic valve is abnormal. There is mild calcification of the aortic valve. There is moderate thickening of the aortic valve. Aortic valve regurgitation is mild. Aortic regurgitation PHT measures 206 msec. Aortic valve sclerosis/calcification is present, without any evidence of aortic stenosis. Aortic valve mean gradient measures 6.0 mmHg. Aortic valve peak gradient measures 10.6 mmHg. Aortic valve area, by VTI measures 0.84 cm. Pulmonic Valve: The pulmonic valve was normal in structure. Pulmonic valve regurgitation is not visualized. No evidence of pulmonic stenosis. Aorta: The aortic root is normal in size and structure. Venous: The inferior vena cava is normal in size with greater than 50% respiratory variability, suggesting right atrial pressure of 3 mmHg. IAS/Shunts: The interatrial septum was not well visualized.  LEFT VENTRICLE PLAX 2D LVIDd:         4.30 cm LVIDs:         2.65 cm LV PW:         0.70 cm LV IVS:        0.70 cm LVOT diam:     1.40 cm LV SV:         24 LV SV Index:   15 LVOT Area:     1.54 cm  RIGHT VENTRICLE             IVC RV S prime:     10.10 cm/s  IVC diam: 1.90 cm TAPSE (M-mode): 1.7 cm LEFT ATRIUM  Index        RIGHT ATRIUM           Index  LA diam:        3.40 cm 2.12 cm/m   RA Area:     11.90 cm LA Vol (A2C):   26.5 ml 16.53 ml/m  RA Volume:   26.90 ml  16.78 ml/m LA Vol (A4C):   49.3 ml 30.75 ml/m LA Biplane Vol: 36.3 ml 22.64 ml/m  AORTIC VALVE AV Area (Vmax):    0.72 cm AV Area (Vmean):   0.78 cm AV Area (VTI):     0.84 cm AV Vmax:           162.88 cm/s AV Vmean:          116.411 cm/s AV VTI:            0.289 m AV Peak Grad:      10.6 mmHg AV Mean Grad:      6.0 mmHg LVOT Vmax:         76.00 cm/s LVOT Vmean:        59.000 cm/s LVOT VTI:          0.157 m LVOT/AV VTI ratio: 0.54 AI PHT:            206 msec  AORTA Ao Root diam: 2.90 cm Ao Asc diam:  2.20 cm MITRAL VALVE              TRICUSPID VALVE MV Mean grad: 4.2 mmHg    TR Peak grad:   37.7 mmHg MV E velocity: 4.57 cm/s  TR Vmax:        307.00 cm/s                            SHUNTS                           Systemic VTI:  0.16 m                           Systemic Diam: 1.40 cm Weston Brass MD Electronically signed by Weston Brass MD Signature Date/Time: 02/08/2024/12:19:37 PM    Final    CT Chest W Contrast Result Date: 02/07/2024 CLINICAL DATA:  Small cell lung cancer. EXAM: CT CHEST WITH CONTRAST TECHNIQUE: Multidetector CT imaging of the chest was performed during intravenous contrast administration. RADIATION DOSE REDUCTION: This exam was performed according to the departmental dose-optimization program which includes automated exposure control, adjustment of the mA and/or kV according to patient size and/or use of iterative reconstruction technique. CONTRAST:  75mL OMNIPAQUE IOHEXOL 300 MG/ML  SOLN COMPARISON:  Chest CT dated 01/25/2024. FINDINGS: Cardiovascular: There is no cardiomegaly or pericardial effusion. Coronary vascular calcification of the LAD. Mild atherosclerotic calcification of the thoracic aorta. No aneurysmal dilatation or dissection. The origins of the great vessels of the aortic arch appear patent. There is a large pulmonary embolus in the left pulmonary  artery extending into the lobar branches. This may represent a bland thrombus versus tumor thrombus. The RV/LV ratio is approximately 1.0. Mediastinum/Nodes: No obvious right hilar or mediastinal adenopathy. Evaluation of the left hilum is limited due to consolidative changes of the left lung. The esophagus is grossly unremarkable. No mediastinal fluid collection. Lungs/Pleura: Similar appearance of a loculated appearing left pleural effusion. Left perihilar consolidative changes of the majority of the left lower  lobe with volume loss as seen on the prior CT. There is occlusion of the left lower lobe bronchi. A centrally occlusive mass is not excluded. Small right pleural effusion as seen previously.  No pneumothorax. Upper Abdomen: Gallstones. Musculoskeletal: Osteopenia. No acute osseous pathology. Asymmetric right retroareolar soft tissue nodule. IMPRESSION: 1. Left central and lobar pulmonary artery embolus with evidence of right heart straining. 2. Similar appearance of a loculated appearing left pleural effusion. 3. Left perihilar consolidation/mass with occlusion of the left lower lobe bronchi and complete left lower lobe collapse. 4. Small right pleural effusion as seen previously. 5. Cholelithiasis. 6.  Aortic Atherosclerosis (ICD10-I70.0). These results were called by telephone at the time of interpretation on 02/07/2024 at 12:59 pm to Dr. Lorre Nick , who verbally acknowledged these results. Electronically Signed   By: Elgie Collard M.D.   On: 02/07/2024 13:00   DG Chest Port 1 View Result Date: 02/07/2024 CLINICAL DATA:  Cough. EXAM: PORTABLE CHEST 1 VIEW COMPARISON:  CT scan chest from 01/25/2024. FINDINGS: Essentially stable exam. Redemonstration of left lower hemithorax opacification which corresponds to left pleural effusion and associated left lung compressive atelectasis. There is linear opacity along the right mid lung zone, which corresponds to atelectasis/scarring along the minor  fissure, unchanged. Bilateral lung fields are otherwise clear. Right lateral costophrenic angle is clear. Evaluation of cardiomediastinal silhouette is nondiagnostic due to left lower hemithorax opacification. No acute osseous abnormalities. The soft tissues are within normal limits. IMPRESSION: *Essentially stable exam, as described above. No acute cardiopulmonary abnormality. *Persistent left lower hemithorax opacification. Electronically Signed   By: Jules Schick M.D.   On: 02/07/2024 11:12   CT Chest Wo Contrast Result Date: 01/31/2024 CLINICAL DATA:  Non-small cell lung cancer (NSCLC), monitor continued surveillance for h/o NSCLC, now 5 years disease free s/p treatment. * Tracking Code: BO * EXAM: CT CHEST WITHOUT CONTRAST TECHNIQUE: Multidetector CT imaging of the chest was performed following the standard protocol without IV contrast. RADIATION DOSE REDUCTION: This exam was performed according to the departmental dose-optimization program which includes automated exposure control, adjustment of the mA and/or kV according to patient size and/or use of iterative reconstruction technique. COMPARISON:  CT angiography chest from 08/29/2023. FINDINGS: Cardiovascular: Normal cardiac size. No pericardial effusion. No aortic aneurysm. There are coronary artery calcifications, in keeping with coronary artery disease. There are also mild-to-moderate peripheral atherosclerotic vascular calcifications of thoracic aorta and its major branches. Mediastinum/Nodes: Visualized thyroid gland appears grossly unremarkable. No solid / cystic mediastinal masses. The esophagus is nondistended precluding optimal assessment. There are few mildly prominent mediastinal lymph nodes, which do not meet the size criteria for lymphadenopathy and appear decreased in size since the prior study. No axillary lymphadenopathy by size criteria. Evaluation of bilateral hila is limited due to lack on intravenous contrast: however, no large hilar  lymphadenopathy identified. Lungs/Pleura: The trachea and right bronchial tree is patent. There is gradual complete occlusion of the left lung lower lobe bronchus, similar to the prior study. There is associated complete collapse of the left lung lower lobe, similar to the prior study. There is associated small-to-moderate chronic left pleural effusion with associated pleural thickening. There is linear area of scarring/atelectasis along the minor fissure, similar to the prior study. There is focal scarring along the left lung upper lobe, laterally, likely from prior surgery. No new lung mass, consolidation or pneumothorax. No suspicious lung nodule. There is trace right pleural effusion. There are patchy areas of mosaic attenuation of lungs, consistent with heterogeneous  air trapping related to small airways disease. Upper Abdomen: Partially seen single calcified gallstone without overt signs of acute cholecystitis in the visualized portion. There is a small sliding hiatal hernia. There is a 8 by 11 mm left adrenal nodule, incompletely characterized on the current examination but unchanged since the prior study and favored to represent an adenoma. Remaining visualized upper abdominal viscera within normal limits. Musculoskeletal: The visualized soft tissues of the chest wall are grossly unremarkable. There is diffuse osteopenia of the visualized osseous structures. No suspicious osseous lesions. There are mild multilevel degenerative changes in the visualized spine. IMPRESSION: 1. Essentially stable exam. 2. Redemonstration of complete collapse of the left lung lower lobe and associated small-to-moderate chronic left pleural effusion with thickening. 3. Linear area of scarring/atelectasis along the minor fissure and focal scarring along the left lung upper lobe, similar to the prior study. 4. No new lung mass, consolidation, pneumothorax or suspicious lung nodule. 5. Multiple other nonacute observations, as  described above. Aortic Atherosclerosis (ICD10-I70.0). Electronically Signed   By: Jules Schick M.D.   On: 01/31/2024 09:06     Subjective: No acute issues or events overnight no further episodes of bleeding hemoptysis coughing or shortness of breath.   Discharge Exam: Vitals:   02/09/24 0830 02/09/24 1330  BP:    Pulse:    Resp:    Temp:    SpO2: 98% 97%   Vitals:   02/08/24 2142 02/09/24 0555 02/09/24 0830 02/09/24 1330  BP: 120/71 (!) 93/57    Pulse:      Resp: 18 18    Temp: (!) 97.4 F (36.3 C) 98 F (36.7 C)    TempSrc: Oral Oral    SpO2: 99% 99% 98% 97%  Weight:      Height:        General: Pt is alert, awake, not in acute distress Cardiovascular: RRR, S1/S2 +, no rubs, no gallops Respiratory: CTA bilaterally, no wheezing, no rhonchi Abdominal: Soft, NT, ND, bowel sounds + Extremities: no edema, no cyanosis    The results of significant diagnostics from this hospitalization (including imaging, microbiology, ancillary and laboratory) are listed below for reference.     Microbiology: No results found for this or any previous visit (from the past 240 hours).   Labs: BNP (last 3 results) No results for input(s): "BNP" in the last 8760 hours. Basic Metabolic Panel: Recent Labs  Lab 02/07/24 1039  NA 140  K 4.1  CL 98  CO2 36*  GLUCOSE 100*  BUN 23  CREATININE 0.75  CALCIUM 9.5   Liver Function Tests: Recent Labs  Lab 02/07/24 1039  AST 18  ALT 12  ALKPHOS 53  BILITOT 0.6  PROT 6.5  ALBUMIN 3.3*   No results for input(s): "LIPASE", "AMYLASE" in the last 168 hours. No results for input(s): "AMMONIA" in the last 168 hours. CBC: Recent Labs  Lab 02/07/24 1039 02/08/24 0553 02/08/24 1648 02/09/24 0837  WBC 6.8 5.4 5.8 4.6  NEUTROABS 5.1  --   --   --   HGB 10.6* 9.8* 10.1* 9.8*  HCT 34.1* 32.6* 33.1* 32.9*  MCV 90.0 92.9 91.4 93.2  PLT 164 145* 165 160   Cardiac Enzymes: No results for input(s): "CKTOTAL", "CKMB", "CKMBINDEX",  "TROPONINI" in the last 168 hours. BNP: Invalid input(s): "POCBNP" CBG: No results for input(s): "GLUCAP" in the last 168 hours. D-Dimer No results for input(s): "DDIMER" in the last 72 hours. Hgb A1c No results for input(s): "HGBA1C" in the last  72 hours. Lipid Profile No results for input(s): "CHOL", "HDL", "LDLCALC", "TRIG", "CHOLHDL", "LDLDIRECT" in the last 72 hours. Thyroid function studies No results for input(s): "TSH", "T4TOTAL", "T3FREE", "THYROIDAB" in the last 72 hours.  Invalid input(s): "FREET3" Anemia work up No results for input(s): "VITAMINB12", "FOLATE", "FERRITIN", "TIBC", "IRON", "RETICCTPCT" in the last 72 hours. Urinalysis    Component Value Date/Time   COLORURINE STRAW (A) 02/06/2018 1702   APPEARANCEUR CLEAR 02/06/2018 1702   LABSPEC 1.001 (L) 02/06/2018 1702   PHURINE 7.0 02/06/2018 1702   GLUCOSEU NEGATIVE 02/06/2018 1702   HGBUR NEGATIVE 02/06/2018 1702   BILIRUBINUR NEGATIVE 02/06/2018 1702   KETONESUR 5 (A) 02/06/2018 1702   PROTEINUR NEGATIVE 02/06/2018 1702   NITRITE NEGATIVE 02/06/2018 1702   LEUKOCYTESUR NEGATIVE 02/06/2018 1702   Sepsis Labs Recent Labs  Lab 02/07/24 1039 02/08/24 0553 02/08/24 1648 02/09/24 0837  WBC 6.8 5.4 5.8 4.6   Microbiology No results found for this or any previous visit (from the past 240 hours).   Time coordinating discharge: Over 30 minutes  SIGNED:   Azucena Fallen, DO [facility] 02/09/2024, 2:19 PM Pager   If 7PM-7AM, please contact night-coverage [email or web address]

## 2024-02-09 NOTE — Evaluation (Signed)
Occupational Therapy Evaluation Patient Details Name: [name] French MRN: [id number] DOB: 1926/07/15 Today's Date: 02/09/2024   History of Present Illness   Pt is a 89 yo female who presents to [facility] ED and admitted with with acute episode of hemoptysis. Current problem acute on likely chronic PE, questionably provoked hemoptysis, Acute respiratory failure without hypoxia from baseline. PMH resolved  Chronic hypoxic respiratory failure, R breast CA s/p R lobectomy and chemotherapy, R non-small cell lung cancer status post radiation 2018, chronic left pleural effusion, hypertension, hyperlipidemia and former smoker.     Clinical Impressions  Pt seen for skilled OT initial evaluation. Pt presents on 2 ltrs of O@ via [place] and was able to maintain >92 % sats with min cues for pacing and breathing with decreased activity tolerance and balance requiring RW for all mobility and ADL's. Pt lives in ALF and has support for bathing and higher level ADL's and uses a RW for household an Art gallery manager for mobility longer distances. OT recommending HHOT services upon d/c from acute. No further acute OT needs with OT signing off services.     If plan is discharge home, recommend the following:   A little help with walking and/or transfers;A little help with bathing/dressing/bathroom;Assistance with cooking/housework;Assist for transportation;Help with stairs or ramp for entrance     Functional Status Assessment   Patient has had a recent decline in their functional status and demonstrates the ability to make significant improvements in function in a reasonable and predictable amount of time.     Equipment Recommendations   None recommended by OT      Precautions/Restrictions   Precautions Precautions: None Restrictions Weight Bearing Restrictions Per Provider Order: No Other Position/Activity Restrictions: watch O2 sats > 92 % on 2 ltrs O2 via [place]     Mobility Bed Mobility Overal bed  mobility: Modified Independent                  Transfers Overall transfer level: Needs assistance Equipment used: Rolling walker (2 wheels) Transfers: Bed to chair/wheelchair/BSC, Sit to/from Stand Sit to Stand: Supervision Stand pivot transfers: Supervision                Balance Overall balance assessment: Needs assistance Sitting-balance support: No upper extremity supported, Feet supported Sitting balance-Leahy Scale: Good     Standing balance support: Single extremity supported Standing balance-Leahy Scale: Fair Standing balance comment: stood for simple ADL's sinkside                           ADL either performed or assessed with clinical judgement   ADL Overall ADL's : Needs assistance/impaired Eating/Feeding: Independent Eating/Feeding Details (indicate cue type and reason): bed and recliner level Grooming: Wash/dry hands;Brushing hair;Wash/dry face;Supervision/safety;Standing Grooming Details (indicate cue type and reason): Stood sink side with Rw and portable O2 Upper Body Bathing: Set up;Sitting   Lower Body Bathing: Contact guard assist;Sit to/from stand   Upper Body Dressing : Set up   Lower Body Dressing: Supervision/safety Lower Body Dressing Details (indicate cue type and reason): used figure 4 techniques for LB Toilet Transfer: Radiographer, therapeutic Details (indicate cue type and reason): RW with O2 - Toileting- Clothing Manipulation and Hygiene: Set up   Tub/ Shower Transfer: Supervision/safety   Functional mobility during ADLs: Rolling walker (2 wheels) General ADL Comments: cues for breathing and pacing with O2     Vision Baseline Vision/History: 1 Wears glasses Ability to See in Adequate  Light: 0 Adequate Patient Visual Report: No change from baseline       Perception Perception: Within Functional Limits       Praxis Praxis: WFL       Pertinent Vitals/Pain Pain Assessment Pain Assessment:  No/denies pain     Extremity/Trunk Assessment Upper Extremity Assessment Upper Extremity Assessment: Overall WFL for tasks assessed   Lower Extremity Assessment Lower Extremity Assessment: Defer to PT evaluation   Cervical / Trunk Assessment Cervical / Trunk Assessment: Normal   Communication Communication Communication: Other (comment) (HOH) Factors Affecting Communication: Hearing impaired   Cognition Arousal: Alert   Cognition: No apparent impairments             OT - Cognition Comments: mild new info STM                 Following commands: Intact                  Home Living Family/patient expects to be discharged to:: Assisted living                             Home Equipment: Agricultural consultant (2 wheels);Electric scooter;Shower seat;Hand held shower head;Grab bars - tub/shower   Additional Comments: pt lives in [facility]      Prior Functioning/Environment Prior Level of Function : Independent/Modified Independent             Mobility Comments: RW with bag for O2 ADLs Comments: S for bathing, all BADL's with mod I, has AE    OT Problem List: Decreased strength;Decreased activity tolerance   OT Treatment/Interventions:        OT Goals(Current goals can be found in the care plan section)   Acute Rehab OT Goals Patient Stated Goal: to go back to ALF OT Goal Formulation: With patient Time For Goal Achievement: 02/23/24 Potential to Achieve Goals: Good   AM-PAC OT "6 Clicks" Daily Activity     Outcome Measure Help from another person eating meals?: None Help from another person taking care of personal grooming?: A Little Help from another person toileting, which includes using toliet, bedpan, or urinal?: A Little Help from another person bathing (including washing, rinsing, drying)?: A Little Help from another person to put on and taking off regular upper body clothing?: A Little Help from another person to put on and  taking off regular lower body clothing?: A Little 6 Click Score: 19   End of Session Equipment Utilized During Treatment: Gait belt;Rolling walker (2 wheels) Nurse Communication: Mobility status  Activity Tolerance: Patient tolerated treatment well Patient left: in chair;with call bell/phone within reach;with chair alarm set  OT Visit Diagnosis: Other abnormalities of gait and mobility (R26.89);Muscle weakness (generalized) (M62.81)                Time: 5366-4403 OT Time Calculation (min): 25 min Charges:  OT General Charges $OT Visit: 1 Visit OT Evaluation $OT Eval Moderate Complexity: 1 Mod OT Treatments $Self Care/Home Management : 8-22 mins Aengus Sauceda OT/L Acute Rehabilitation Department  [phone]   02/09/2024, 11:14 AM

## 2024-02-09 NOTE — TOC Progression Note (Addendum)
Transition of Care [facility]) - Progression Note    Patient Details  Name: [name] French MRN: [id number] Date of Birth: Jul 04, 1926  [facility]) CM/SW Contact  Beckie Busing, RN Phone [phone]  02/09/2024, 2:51 PM  Clinical Narrative:   CM following patient with discharge orders. Patient is from [facility] assisted living. CM has attempted to reach admissions in reference to patient return. CM has reached out to [employer] twice with no answer [phone]. CM has also reached out to the resident care manager with no answer [phone].  CM has reached out to Hal Morales @ [phone].   1515 CM spoke with nurse Crystal who referred CM to supervisor Duwayne Heck [phone]. CM has explained to [facility] that patient has been discharged. All questions answered. Duwayne Heck states that she will speak with daughter and they will figure out transport per [facility]. CM has provided RN 's number to call once transport has been set up. Duwayne Heck does confirm that patient is able to return. FL2 has been updated and discharge summary have been printed and placed in discharge packet for AL.   RN please call report to 2nd floor nurses station [phone]        Expected Discharge Plan and Services         Expected Discharge Date: 02/09/24                                     Social Determinants of Health (SDOH) Interventions SDOH Screenings   Food Insecurity: No Food Insecurity (02/07/2024)  Housing: Low Risk  (02/07/2024)  Transportation Needs: No Transportation Needs (02/07/2024)  Utilities: Not At Risk (02/07/2024)  Depression (PHQ2-9): Low Risk  (01/29/2024)  Social Connections: Moderately Integrated (02/07/2024)  Tobacco Use: Medium Risk (02/07/2024)    Readmission Risk Interventions     No data to display

## 2024-02-09 NOTE — NC FL2 (Signed)
[place] MEDICAID FL2 LEVEL OF CARE FORM     IDENTIFICATION  Patient Name: [name] French Birthdate: May 31, 1926 Sex: female Admission Date (Current Location): 02/07/2024  [facility] and [place] Number:  Geophysical data processor and Address:  [facility],  [address], [place] [address]      Provider Number: [id number]  Attending Physician Name and Address:  Azucena Fallen, MD  Relative Name and Phone Number:  Christain [place] daughter [phone]    Current Level of Care: Hospital Recommended Level of Care: Assisted Living Facility Prior Approval Number:    Date Approved/Denied:   PASRR Number: n/a for assisted living  Discharge Plan:  (Assisted Living)    Current Diagnoses: Patient Active Problem List   Diagnosis Date Noted   Hemoptysis 02/08/2024   Acute pulmonary embolism, unspecified pulmonary embolism type, unspecified whether acute cor pulmonale present (HCC) 02/07/2024   Pulmonary hypertension, unspecified (HCC) 09/13/2023   Sinus tachycardia 07/26/2021   Hyponatremia 05/22/2020   Senile osteoporosis 09/14/2018   Sleep disturbances 09/14/2018   History of breast cancer 09/14/2018   ACP (advance care planning) 09/14/2018   Status post total replacement of left hip 08/31/2018   Unilateral primary osteoarthritis, left hip 08/06/2018   Pleural effusion on left 07/11/2018   Chest pain 12/25/2017   Abnormal findings on radiological examination of gastrointestinal tract 11/16/2017   Primary cancer of left lower lobe of lung (HCC) 11/15/2017   Non-small cell cancer of right lung (HCC) 11/07/2017   Lung mass 10/17/2017   Malignant neoplasm of upper-inner quadrant of right breast in female, estrogen receptor positive (HCC) 06/10/2013   Mass of breast, left 05/31/2013   History of colonic polyps 07/26/2010    Orientation RESPIRATION BLADDER Height & Weight     Self, Time, Situation, Place  O2 (O2@ 2L baseline) Continent Weight: 55.3  kg Height:  5\' 5"  (165.1 cm)  BEHAVIORAL SYMPTOMS/MOOD NEUROLOGICAL BOWEL NUTRITION STATUS   (n/a)  (n/a) Continent Diet (Regular)  AMBULATORY STATUS COMMUNICATION OF NEEDS Skin   Limited Assist (walker) Verbally Normal                       Personal Care Assistance Level of Assistance  Bathing, Feeding, Dressing, Total care Bathing Assistance: Limited assistance Feeding assistance: Independent Dressing Assistance: Limited assistance Total Care Assistance:  (n/a)   Functional Limitations Info  Sight, Hearing, Speech Sight Info: Adequate Hearing Info: Adequate Speech Info: Adequate    SPECIAL CARE FACTORS FREQUENCY                       Contractures      Additional Factors Info  Code Status, Allergies, Psychotropic, Insulin Sliding Scale, Isolation Precautions, Suctioning Needs Code Status Info: DNR Allergies Info: Other, Codeine, Hydrocodone, Neosporin (Neomycin-bacitracin Zn-polymyx) Psychotropic Info: see discharge summary Insulin Sliding Scale Info: see discharge summary Isolation Precautions Info: n/a Suctioning Needs: n/a   Current Medications (02/09/2024):   Medication List       STOP taking these medications     Align 4 MG Caps    doxylamine (Sleep) 25 MG tablet Commonly known as: UNISOM           TAKE these medications     cetirizine 10 MG tablet Commonly known as: ZYRTEC Take 10 mg by mouth daily.    escitalopram 5 MG tablet Commonly known as: Lexapro Take 1 tablet (5 mg total) by mouth daily.    famotidine 20 MG  tablet Commonly known as: Pepcid Take 1 tablet (20 mg total) by mouth daily.    fluticasone 50 MCG/ACT nasal spray Commonly known as: FLONASE Place 2 sprays into both nostrils daily.    levalbuterol 0.63 MG/3ML nebulizer solution Commonly known as: Xopenex Take 3 mLs (0.63 mg total) by nebulization every 6 (six) hours as needed for wheezing or shortness of breath.    metoprolol tartrate 25 MG tablet Commonly known  as: LOPRESSOR Take 0.5 tablets (12.5 mg total) by mouth 2 (two) times daily. What changed:  how much to take when to take this Another medication with the same name was removed. Continue taking this medication, and follow the directions you see here.    nitroGLYCERIN 0.4 MG SL tablet Commonly known as: NITROSTAT Place 0.4 mg under the tongue every 5 (five) minutes as needed for chest pain.    OXYGEN Inhale 3 L into the lungs as directed. Every Shift; Shift 1, Shift 2, Shift 3    potassium chloride 10 MEQ tablet Commonly known as: KLOR-CON M Take 10 mEq by mouth daily.    pravastatin 40 MG tablet Commonly known as: PRAVACHOL Take 40 mg daily by mouth.    PreserVision AREDS 2 Caps Take 1 mg by mouth in the morning and at bedtime.    Super B Complex/FA/Vit C Tabs Take by mouth.    torsemide 20 MG tablet Commonly known as: DEMADEX Take 0.5 tablets (10 mg total) by mouth daily. 1 tablet once a morning What changed: how much to take    Vitamin D-3 25 MCG (1000 UT) Caps Take 2,000 Units by mouth daily.             Discharge Medications: Please see discharge summary for a list of discharge medications.  Relevant Imaging Results:  Relevant Lab Results:   Additional Information SS# [id number]  Beckie Busing, RN

## 2024-02-09 NOTE — Plan of Care (Signed)

## 2024-02-09 NOTE — Evaluation (Signed)
Physical Therapy Evaluation Patient Details Name: [name] French MRN: [id number] DOB: May 13, 1926 Today's Date: 02/09/2024  History of Present Illness  Pt is a 89 yo female who presents to [facility] ED on 02/07/24 and admitted with with acute episode of hemoptysis. Current problem acute on likely chronic PE, questionably provoked hemoptysis, Acute respiratory failure without hypoxia from baseline. PMH resolved  Chronic hypoxic respiratory failure, R breast CA s/p R lobectomy and chemotherapy, R non-small cell lung cancer status post radiation 2018, chronic left pleural effusion, hypertension, hyperlipidemia and former smoker.  Clinical Impression  Pt admitted with above diagnosis.  At baseline she resides at [facility].  Today , pt transferring at mod I level and ambulated safely with and without RW on 2 L O2 with VSS.  Pt appears to be at baseline.  No further PT needs.  Recommend return to ALF.         If plan is discharge home, recommend the following: Assistance with cooking/housework   Can travel by private vehicle        Equipment Recommendations None recommended by PT  Recommendations for Other Services       Functional Status Assessment Patient has not had a recent decline in their functional status     Precautions / Restrictions Precautions Precautions: None      Mobility  Bed Mobility Overal bed mobility: Modified Independent                  Transfers Overall transfer level: Modified independent Equipment used: Rolling walker (2 wheels) Transfers: Sit to/from Stand Sit to Stand: Modified independent (Device/Increase time)           General transfer comment: Had supervision during eval but was mod I and safe. Stood from chair and toielt.  Performed ADLs with mod I    Ambulation/Gait Ambulation/Gait assistance: Supervision Gait Distance (Feet): 200 Feet Assistive device: Rolling walker (2 wheels), None Gait Pattern/deviations: Step-through  pattern Gait velocity: decreased but functional     General Gait Details: Steady gait with and without RW  Stairs            Wheelchair Mobility     Tilt Bed    Modified Rankin (Stroke Patients Only)       Balance Overall balance assessment: Needs assistance Sitting-balance support: No upper extremity supported Sitting balance-Leahy Scale: Good     Standing balance support: No upper extremity supported Standing balance-Leahy Scale: Good Standing balance comment: RW for longer distance; did ambulate in room without AD                             Pertinent Vitals/Pain Pain Assessment Pain Assessment: No/denies pain    Home Living Family/patient expects to be discharged to:: Assisted living                 Home Equipment: Agricultural consultant (2 wheels);Electric scooter;Shower seat;Hand held shower head;Grab bars - tub/shower Additional Comments: pt lives in [facility]    Prior Function Prior Level of Function : Independent/Modified Independent             Mobility Comments: RW with bag for O2; uses RW in her facility but if going to the other building at ALF will use scooter ADLs Comments: S for bathing, all BADL's with mod I, has AE     Extremity/Trunk Assessment   Upper Extremity Assessment Upper Extremity Assessment: Overall WFL for tasks assessed  Lower Extremity Assessment Lower Extremity Assessment: Overall WFL for tasks assessed    Cervical / Trunk Assessment Cervical / Trunk Assessment: Normal  Communication   Communication Communication: Other (comment) (HOH) Factors Affecting Communication: Hearing impaired    Cognition Arousal: Alert Behavior During Therapy: WFL for tasks assessed/performed   PT - Cognitive impairments: No apparent impairments                       PT - Cognition Comments: pleasant and motivated         Cueing       General Comments General comments (skin integrity, edema,  etc.): Pt on 2 L O2 with sats 98%    Exercises     Assessment/Plan    PT Assessment Patient does not need any further PT services  PT Problem List         PT Treatment Interventions      PT Goals (Current goals can be found in the Care Plan section)  Acute Rehab PT Goals Patient Stated Goal: return to [facility] PT Goal Formulation: All assessment and education complete, DC therapy    Frequency       Co-evaluation               AM-PAC PT "6 Clicks" Mobility  Outcome Measure Help needed turning from your back to your side while in a flat bed without using bedrails?: None Help needed moving from lying on your back to sitting on the side of a flat bed without using bedrails?: None Help needed moving to and from a bed to a chair (including a wheelchair)?: None Help needed standing up from a chair using your arms (e.g., wheelchair or bedside chair)?: None Help needed to walk in hospital room?: None Help needed climbing 3-5 steps with a railing? : A Little 6 Click Score: 23    End of Session Equipment Utilized During Treatment: Oxygen Activity Tolerance: Patient tolerated treatment well Patient left: with chair alarm set;in chair;with call bell/phone within reach Nurse Communication: Mobility status PT Visit Diagnosis: Other abnormalities of gait and mobility (R26.89)    Time: 1610-9604 PT Time Calculation (min) (ACUTE ONLY): 19 min   Charges:   PT Evaluation $PT Eval Low Complexity: 1 Low   PT General Charges $$ ACUTE PT VISIT: 1 Visit         Anise Salvo, PT Acute Rehab Services [facility] [phone]   Rayetta Humphrey 02/09/2024, 11:49 AM

## 2024-02-12 ENCOUNTER — Encounter: Payer: Self-pay | Admitting: Internal Medicine

## 2024-02-12 ENCOUNTER — Non-Acute Institutional Stay: Payer: Self-pay | Admitting: Internal Medicine

## 2024-02-12 DIAGNOSIS — R042 Hemoptysis: Secondary | ICD-10-CM | POA: Diagnosis not present

## 2024-02-12 DIAGNOSIS — Z853 Personal history of malignant neoplasm of breast: Secondary | ICD-10-CM | POA: Diagnosis not present

## 2024-02-12 DIAGNOSIS — E782 Mixed hyperlipidemia: Secondary | ICD-10-CM

## 2024-02-12 DIAGNOSIS — J9611 Chronic respiratory failure with hypoxia: Secondary | ICD-10-CM | POA: Diagnosis not present

## 2024-02-12 DIAGNOSIS — I272 Pulmonary hypertension, unspecified: Secondary | ICD-10-CM

## 2024-02-12 DIAGNOSIS — F411 Generalized anxiety disorder: Secondary | ICD-10-CM

## 2024-02-12 DIAGNOSIS — R000 Tachycardia, unspecified: Secondary | ICD-10-CM | POA: Diagnosis not present

## 2024-02-12 DIAGNOSIS — I38 Endocarditis, valve unspecified: Secondary | ICD-10-CM | POA: Diagnosis not present

## 2024-02-12 NOTE — Progress Notes (Signed)
 Location:  [facility] Room Number: 624/A Place of Service:  ALF [phone]) Provider:  Mahlon Gammon, MD  Patient Care Team: Mahlon Gammon, MD as PCP - General (Internal Medicine) [place], Peter M, MD as PCP - Cardiology (Cardiology) Serena Croissant, MD as Consulting Physician (Hematology and Oncology)  Extended Emergency Contact Information Primary Emergency Contact: [name] [place] Home Phone: [phone] Relation: Daughter Secondary Emergency Contact: [name] [place] Home Phone: [phone] Mobile Phone: [phone] Relation: Son  Code Status:  DNR Goals of care: Advanced Directive information    02/12/2024   10:03 AM  Advanced Directives  Does Patient Have a Medical Advance Directive? Yes  Type of Estate agent of [place];Living will;Out of facility DNR (pink MOST or yellow form)  Does patient want to make changes to medical advance directive? No - Patient declined  Copy of Healthcare Power of Attorney in Chart? Yes - validated most recent copy scanned in chart (See row information)     Chief Complaint  Patient presents with   Acute Visit    Hospital Follow up    HPI:  Pt is a 89 y.o. female seen today for a hospital f/u s/p admission from Hospital Discharge Patient is now in AL in [place]   She is admitted to the hospital from 02/19-02/21 for Hemoptysis   Patient has a history of breast cancer s/p bilateral lumpectomy in 2014 with recurrence in 2021 in remission  history of NSCLC S/P Radiation not candidate for chemo or surgery History of chronic left pleural effusion that has been present for more than 4 years Diastolic CHF Echo Shows Severe TR and Elevated Pulmonary HTN and Normal EF CT scan Chronic Occlusive Thrombus in Left LL of Pulmonary Artery Consolidation of Left LL with Effusion CT scan of Chest on 09/10 New Right sided Effusion Underwent  Thoracocentesis on 09/11 Fluid is Transudate with no suspicious cells  She was send to the hospital from [place] due to episodes of Hemoptysis At first it was thought to be due to Acute PE But later it is possible Old Thrombus and not new And her Hemoptysis is sec to her h/o Cancer radiation and fibrosis. No Anticoagulation for now due to her Bleeding risk Her Hgb was stable so she was discharged back facility Patient's hemoptysis also stopped after she got couple of neb treatment.  Patient was back in her room.  She continues to feel some shortness of breath is still tachycardic but has not had any more episodes of bleeding .  She states that she does not want to go back to the hospital if this happens Again No chest pain cough or Fever    Past Medical History:  Diagnosis Date   Allergic rhinitis    Anemia    during pregnancy   Arthritis    Breast cancer (HCC) 05/08/13   right upper inner, invasive mammary   Breast cancer, right breast (HCC) 04/16/2013   Underwent lumpectomy on 11/06/13. 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   Use of letrozole (Femara)    neoadjuvant antiestrogen therapy with letrozole 2.5 mg daily x 7 monhts   Past Surgical History:  Procedure Laterality Date   BREAST BIOPSY Left 1960   lt br bx/benign   BREAST LUMPECTOMY Right    BREAST LUMPECTOMY Right 02/10/2020   Procedure: RIGHT BREAST LUMPECTOMY;  Surgeon: Abigail Miyamoto, MD;  Location: [facility] OR;  Service: General;  Laterality: Right;   BREAST LUMPECTOMY WITH NEEDLE LOCALIZATION  Bilateral 11/06/2013   Procedure: BREAST LUMPECTOMY WITH NEEDLE LOCALIZATION;  Surgeon: Currie Paris, MD;  Location: [facility];  Service: General;  Laterality: Bilateral;   COLONOSCOPY     EYE SURGERY Bilateral    both cataracts   MOHS SURGERY Right    nose basal/squamous   THORACENTESIS N/A 08/30/2023   Procedure: THORACENTESIS;  Surgeon: Luciano Cutter, MD;  Location: [facility];  Service: Pulmonary;  Laterality: N/A;   TOTAL HIP ARTHROPLASTY Left 08/31/2018   Procedure: LEFT TOTAL HIP ARTHROPLASTY ANTERIOR APPROACH;  Surgeon: Kathryne Hitch, MD;  Location: [facility] ORS;  Service: Orthopedics;  Laterality: Left;   VIDEO BRONCHOSCOPY WITH ENDOBRONCHIAL NAVIGATION N/A 11/01/2017   Procedure: VIDEO BRONCHOSCOPY WITH ENDOBRONCHIAL NAVIGATION;  Surgeon: Loreli Slot, MD;  Location: [facility] OR;  Service: Thoracic;  Laterality: N/A;   WRIST SURGERY  1990   lt    Allergies  Allergen Reactions   Other Anaphylaxis    Lobster- "years ago"   Codeine Itching    Insomnia    Hydrocodone Itching and Other (See Comments)    Insomnia   Neosporin [Neomycin-Bacitracin Zn-Polymyx] Rash    Outpatient Encounter Medications as of 02/12/2024  Medication Sig   B Complex-C-Folic Acid (SUPER B COMPLEX/FA/VIT C) TABS Take by mouth.   cetirizine (ZYRTEC) 10 MG tablet Take 10 mg by mouth daily.   Cholecalciferol (VITAMIN D-3) 1000 UNITS CAPS Take 2,000 Units by mouth daily.   escitalopram (LEXAPRO) 5 MG tablet Take 1 tablet (5 mg total) by mouth daily.   famotidine (PEPCID) 20 MG tablet Take 1 tablet (20 mg total) by mouth daily.   fluticasone (FLONASE) 50 MCG/ACT nasal spray Place 2 sprays into both nostrils daily.   levalbuterol (XOPENEX) 0.63 MG/3ML nebulizer solution Take 3 mLs (0.63 mg total) by nebulization every 6 (six) hours as needed for wheezing or shortness of breath.   metoprolol tartrate (LOPRESSOR) 25 MG tablet Take 0.5 tablets (12.5 mg total) by mouth 2 (two)  times daily.   Multiple Vitamins-Minerals (PRESERVISION AREDS 2) CAPS Take 1 mg by mouth in the morning and at bedtime.   nitroGLYCERIN (NITROSTAT) 0.4 MG SL tablet Place 0.4 mg under the tongue every 5 (five) minutes as needed for chest pain.   OXYGEN Inhale 3 L into the lungs as directed. Every Shift; Shift 1, Shift 2, Shift 3   potassium chloride (KLOR-CON M) 10 MEQ tablet Take 10 mEq by mouth daily.   pravastatin (PRAVACHOL) 40 MG tablet Take 40 mg daily by mouth.    torsemide (DEMADEX) 20 MG tablet Take 0.5 tablets (10 mg total) by mouth daily. 1 tablet once a morning   No facility-administered encounter medications on file as of 02/12/2024.    Review of Systems  Constitutional:  Negative for activity change and appetite change.  HENT: Negative.    Respiratory:  Positive for shortness  of breath. Negative for cough.   Cardiovascular:  Negative for leg swelling.  Gastrointestinal:  Negative for constipation.  Genitourinary: Negative.   Musculoskeletal:  Negative for arthralgias, gait problem and myalgias.  Skin: Negative.   Neurological:  Negative for dizziness and weakness.  Psychiatric/Behavioral:  Negative for confusion, dysphoric mood and sleep disturbance.     Immunization History  Administered Date(s) Administered   Fluad Quad(high Dose 65+) 09/14/2020   Fluad Trivalent(High Dose 65+) 11/01/2023   Influenza, High Dose Seasonal PF 10/06/2017   Influenza-Unspecified 09/18/2012, 09/27/2013, 09/24/2014, 10/01/2016   Moderna Covid-19 Fall Seasonal Vaccine 1yrs & older 11/02/2023   Moderna Covid-19 Vaccine Bivalent Booster 85yrs & up 04/13/2023   Moderna SARS-COV2 Booster Vaccination 05/21/2021   Moderna Sars-Covid-2 Vaccination 12/28/2019, 01/29/2020   Pneumococcal Conjugate-13 03/18/2014   Pneumococcal Polysaccharide-23 12/04/2008, 02/19/2013   Td 12/04/2008, 04/11/2018   Zoster, Live 12/04/2008   Pertinent  Health Maintenance Due  Topic Date Due   INFLUENZA VACCINE   Completed   DEXA SCAN  Completed   Colonoscopy  Discontinued      03/22/2022   11:00 AM 08/03/2022    4:35 PM 08/25/2023    6:56 AM 10/11/2023    7:16 PM 01/29/2024    2:44 PM  Fall Risk  Falls in the past year?   0 0 0  Was there an injury with Fall?   0 0 0  Fall Risk Category Calculator   0 0 0  (RETIRED) Patient Fall Risk Level Low fall risk Low fall risk     Patient at Risk for Falls Due to   History of fall(s) Impaired mobility Impaired balance/gait;Impaired mobility  Fall risk Follow up   Falls evaluation completed Falls evaluation completed;Education provided;Falls prevention discussed Falls evaluation completed   Functional Status Survey:    Vitals:   02/12/24 0949  BP: (!) 100/58  Pulse: (!) 110  Resp: 20  Temp: 97.7 F (36.5 C)  SpO2: 100%  Weight: 124 lb 3.2 oz (56.3 kg)  Height: 5\' 5"  (1.651 m)   Body mass index is 20.67 kg/m. Physical Exam Vitals reviewed.  Constitutional:      Appearance: Normal appearance.  HENT:     Head: Normocephalic.     Nose: Nose normal.     Mouth/Throat:     Mouth: Mucous membranes are moist.     Pharynx: Oropharynx is clear.  Eyes:     Pupils: Pupils are equal, round, and reactive to light.  Cardiovascular:     Rate and Rhythm: Normal rate and regular rhythm.     Pulses: Normal pulses.     Heart sounds: Murmur heard.  Pulmonary:     Effort: Pulmonary effort is normal. No respiratory distress.     Breath sounds: Normal breath sounds. No wheezing.     Comments: Decreased Breadth Sounds BIlateral Abdominal:     General: Abdomen is flat. Bowel sounds are normal.     Palpations: Abdomen is soft.  Musculoskeletal:        General: No swelling.     Cervical back: Neck supple.  Skin:    General: Skin is warm.  Neurological:     General: No focal deficit present.     Mental Status: She is alert and oriented to person, place, and time.  Psychiatric:        Mood and Affect: Mood normal.        Thought Content: Thought content  normal.     Labs reviewed: Recent Labs  08/11/23 1307 08/25/23 0000 09/11/23 0000 01/25/24 0000 02/07/24 1039  NA 143   < > 143 141 140  K 4.1   < > 3.9 4.4 4.1  CL 94*   < > 90* 98* 98  CO2 37*   < > 44* 30* 36*  GLUCOSE 99  --   --   --  100*  BUN 17   < > 19 19 23   CREATININE 0.70   < > 0.7 0.8 0.75  CALCIUM 9.6   < > 9.7 9.5 9.5   < > = values in this interval not displayed.   Recent Labs    08/25/23 0000 08/30/23 1611 01/25/24 0000 02/07/24 1039  AST 32  --  16 18  ALT 14  --  11 12  ALKPHOS 61  --  66 53  BILITOT  --   --   --  0.6  PROT  --  6.6  --  6.5  ALBUMIN 3.7  --  3.5 3.3*   Recent Labs    02/07/24 1039 02/08/24 0553 02/08/24 1648 02/09/24 0837  WBC 6.8 5.4 5.8 4.6  NEUTROABS 5.1  --   --   --   HGB 10.6* 9.8* 10.1* 9.8*  HCT 34.1* 32.6* 33.1* 32.9*  MCV 90.0 92.9 91.4 93.2  PLT 164 145* 165 160   Lab Results  Component Value Date   TSH 2.40 08/25/2023   Lab Results  Component Value Date   HGBA1C 5.6 12/25/2017   Lab Results  Component Value Date   CHOL 148 01/25/2024   HDL 65 01/25/2024   LDLCALC 70 01/25/2024   TRIG 65 01/25/2024   CHOLHDL 3.1 12/26/2017    Significant Diagnostic Results in last 30 days:  ECHOCARDIOGRAM COMPLETE Result Date: 02/08/2024    ECHOCARDIOGRAM REPORT   Patient Name:   [name] BARLETTA Triplett Date of Exam: 02/07/2024 Medical Rec #:  [id number]        Height:       65.0 in Accession #:    [id number]       Weight:       122.0 lb Date of Birth:  03-06-1926        BSA:          1.603 m Patient Age:    89 years         BP:           120/69 mmHg Patient Gender: F                HR:           106 bpm. Exam Location:  Inpatient Procedure: 2D Echo, Color Doppler and Cardiac Doppler (Both Spectral and Color            Flow Doppler were utilized during procedure). Indications:    Pulmonary Embolus  History:        Patient has prior history of Echocardiogram examinations, most                 recent 05/29/2020.  Sonographer:     Harriette Bouillon RDCS Referring Phys: [id number] Azucena Fallen IMPRESSIONS  1. Left ventricular ejection fraction, by estimation, is 65 to 70%. The left ventricle has normal function. The left ventricle has no regional wall motion abnormalities. Indeterminate diastolic filling due to E-A fusion.  2. Right ventricular systolic function is normal. The right ventricular size is normal. There is mildly elevated pulmonary artery systolic pressure. The estimated right ventricular systolic pressure  is 40.7 mmHg.  3. Left atrial size was mildly dilated.  4. Right atrial size was mildly dilated.  5. The mitral valve is degenerative. Mild to moderate mitral valve regurgitation. No evidence of mitral stenosis.  6. Tricuspid valve regurgitation is mild to moderate.  7. The aortic valve is abnormal. There is mild calcification of the aortic valve. There is moderate thickening of the aortic valve. Aortic valve regurgitation is mild. Aortic valve sclerosis/calcification is present, without any evidence of aortic stenosis. DVI 0.54, SVI 15.  8. The inferior vena cava is normal in size with greater than 50% respiratory variability, suggesting right atrial pressure of 3 mmHg. FINDINGS  Left Ventricle: Left ventricular ejection fraction, by estimation, is 65 to 70%. The left ventricle has normal function. The left ventricle has no regional wall motion abnormalities. The left ventricular internal cavity size was normal in size. There is  no left ventricular hypertrophy. Indeterminate diastolic filling due to E-A fusion. Right Ventricle: The right ventricular size is normal. No increase in right ventricular wall thickness. Right ventricular systolic function is normal. There is mildly elevated pulmonary artery systolic pressure. The tricuspid regurgitant velocity is 3.07  m/s, and with an assumed right atrial pressure of 3 mmHg, the estimated right ventricular systolic pressure is 40.7 mmHg. Left Atrium: Left atrial size was mildly  dilated. Right Atrium: Right atrial size was mildly dilated. Pericardium: There is no evidence of pericardial effusion. Mitral Valve: The mitral valve is degenerative in appearance. Mild to moderate mitral valve regurgitation. No evidence of mitral valve stenosis. The mean mitral valve gradient is 4.2 mmHg with average heart rate of 102 bpm. Tricuspid Valve: The tricuspid valve is normal in structure. Tricuspid valve regurgitation is mild to moderate. No evidence of tricuspid stenosis. Aortic Valve: The aortic valve is abnormal. There is mild calcification of the aortic valve. There is moderate thickening of the aortic valve. Aortic valve regurgitation is mild. Aortic regurgitation PHT measures 206 msec. Aortic valve sclerosis/calcification is present, without any evidence of aortic stenosis. Aortic valve mean gradient measures 6.0 mmHg. Aortic valve peak gradient measures 10.6 mmHg. Aortic valve area, by VTI measures 0.84 cm. Pulmonic Valve: The pulmonic valve was normal in structure. Pulmonic valve regurgitation is not visualized. No evidence of pulmonic stenosis. Aorta: The aortic root is normal in size and structure. Venous: The inferior vena cava is normal in size with greater than 50% respiratory variability, suggesting right atrial pressure of 3 mmHg. IAS/Shunts: The interatrial septum was not well visualized.  LEFT VENTRICLE PLAX 2D LVIDd:         4.30 cm LVIDs:         2.65 cm LV PW:         0.70 cm LV IVS:        0.70 cm LVOT diam:     1.40 cm LV SV:         24 LV SV Index:   15 LVOT Area:     1.54 cm  RIGHT VENTRICLE             IVC RV S prime:     10.10 cm/s  IVC diam: 1.90 cm TAPSE (M-mode): 1.7 cm LEFT ATRIUM             Index        RIGHT ATRIUM           Index LA diam:        3.40 cm 2.12 cm/m   RA Area:  11.90 cm LA Vol (A2C):   26.5 ml 16.53 ml/m  RA Volume:   26.90 ml  16.78 ml/m LA Vol (A4C):   49.3 ml 30.75 ml/m LA Biplane Vol: 36.3 ml 22.64 ml/m  AORTIC VALVE AV Area (Vmax):    0.72  cm AV Area (Vmean):   0.78 cm AV Area (VTI):     0.84 cm AV Vmax:           162.88 cm/s AV Vmean:          116.411 cm/s AV VTI:            0.289 m AV Peak Grad:      10.6 mmHg AV Mean Grad:      6.0 mmHg LVOT Vmax:         76.00 cm/s LVOT Vmean:        59.000 cm/s LVOT VTI:          0.157 m LVOT/AV VTI ratio: 0.54 AI PHT:            206 msec  AORTA Ao Root diam: 2.90 cm Ao Asc diam:  2.20 cm MITRAL VALVE              TRICUSPID VALVE MV Mean grad: 4.2 mmHg    TR Peak grad:   37.7 mmHg MV E velocity: 4.57 cm/s  TR Vmax:        307.00 cm/s                            SHUNTS                           Systemic VTI:  0.16 m                           Systemic Diam: 1.40 cm Weston Brass MD Electronically signed by Weston Brass MD Signature Date/Time: 02/08/2024/12:19:37 PM    Final    CT Chest W Contrast Result Date: 02/07/2024 CLINICAL DATA:  Small cell lung cancer. EXAM: CT CHEST WITH CONTRAST TECHNIQUE: Multidetector CT imaging of the chest was performed during intravenous contrast administration. RADIATION DOSE REDUCTION: This exam was performed according to the departmental dose-optimization program which includes automated exposure control, adjustment of the mA and/or kV according to patient size and/or use of iterative reconstruction technique. CONTRAST:  75mL OMNIPAQUE IOHEXOL 300 MG/ML  SOLN COMPARISON:  Chest CT dated 01/25/2024. FINDINGS: Cardiovascular: There is no cardiomegaly or pericardial effusion. Coronary vascular calcification of the LAD. Mild atherosclerotic calcification of the thoracic aorta. No aneurysmal dilatation or dissection. The origins of the great vessels of the aortic arch appear patent. There is a large pulmonary embolus in the left pulmonary artery extending into the lobar branches. This may represent a bland thrombus versus tumor thrombus. The RV/LV ratio is approximately 1.0. Mediastinum/Nodes: No obvious right hilar or mediastinal adenopathy. Evaluation of the left hilum is  limited due to consolidative changes of the left lung. The esophagus is grossly unremarkable. No mediastinal fluid collection. Lungs/Pleura: Similar appearance of a loculated appearing left pleural effusion. Left perihilar consolidative changes of the majority of the left lower lobe with volume loss as seen on the prior CT. There is occlusion of the left lower lobe bronchi. A centrally occlusive mass is not excluded. Small right pleural effusion as seen previously.  No pneumothorax. Upper Abdomen: Gallstones. Musculoskeletal: Osteopenia. No  acute osseous pathology. Asymmetric right retroareolar soft tissue nodule. IMPRESSION: 1. Left central and lobar pulmonary artery embolus with evidence of right heart straining. 2. Similar appearance of a loculated appearing left pleural effusion. 3. Left perihilar consolidation/mass with occlusion of the left lower lobe bronchi and complete left lower lobe collapse. 4. Small right pleural effusion as seen previously. 5. Cholelithiasis. 6.  Aortic Atherosclerosis (ICD10-I70.0). These results were called by telephone at the time of interpretation on 02/07/2024 at 12:59 pm to Dr. Lorre Nick , who verbally acknowledged these results. Electronically Signed   By: Elgie Collard M.D.   On: 02/07/2024 13:00   DG Chest Port 1 View Result Date: 02/07/2024 CLINICAL DATA:  Cough. EXAM: PORTABLE CHEST 1 VIEW COMPARISON:  CT scan chest from 01/25/2024. FINDINGS: Essentially stable exam. Redemonstration of left lower hemithorax opacification which corresponds to left pleural effusion and associated left lung compressive atelectasis. There is linear opacity along the right mid lung zone, which corresponds to atelectasis/scarring along the minor fissure, unchanged. Bilateral lung fields are otherwise clear. Right lateral costophrenic angle is clear. Evaluation of cardiomediastinal silhouette is nondiagnostic due to left lower hemithorax opacification. No acute osseous abnormalities. The  soft tissues are within normal limits. IMPRESSION: *Essentially stable exam, as described above. No acute cardiopulmonary abnormality. *Persistent left lower hemithorax opacification. Electronically Signed   By: Jules Schick M.D.   On: 02/07/2024 11:12    Assessment/Plan 1. Hemoptysis (Primary) Possible from her Fibrosis/Old Radiation  Patient does not want to go to hospital if it happens again Will Discuss MOST form with her  2. Severe pulmonary hypertension (HCC) Followed by Cardiology Poor Candidate for Valvular Intervention Per Pulmonary PAH due to chronic Lung disease Her overall conditions are not reversible and best management would be oxygen support as needed and medical management with diuresis with Cardiology.   3. Chronic respiratory failure with hypoxia (HCC) On 3 litre of Oxygen   4. Generalized anxiety disorder Change Lexapro 10 mg as per her request  5. Mixed hyperlipidemia On statin LDL 70 in 02/25  6. Sinus tachycardia with Low BP Continue low dose of Metoprolol Will continue to follow for now  7. Hx of breast cancer Has Appoinmtnet with Dr Pamelia Hoit  8. Valvular heart disease Not candidate for interventio   9 Chronic diastolic congestive heart failure (HCC)  Torsemide  10 Goals of care Patient open to hospice if symptoms get worse MOST form to be discussed  Family/ staff Communication:   Labs/tests ordered:  CBC,CMP,in 1 week

## 2024-02-13 ENCOUNTER — Encounter: Payer: Self-pay | Admitting: Orthopedic Surgery

## 2024-02-13 ENCOUNTER — Non-Acute Institutional Stay: Payer: Self-pay | Admitting: Orthopedic Surgery

## 2024-02-13 DIAGNOSIS — R000 Tachycardia, unspecified: Secondary | ICD-10-CM

## 2024-02-13 DIAGNOSIS — R042 Hemoptysis: Secondary | ICD-10-CM | POA: Diagnosis not present

## 2024-02-13 DIAGNOSIS — I509 Heart failure, unspecified: Secondary | ICD-10-CM | POA: Diagnosis not present

## 2024-02-13 DIAGNOSIS — J9811 Atelectasis: Secondary | ICD-10-CM | POA: Diagnosis not present

## 2024-02-13 DIAGNOSIS — J90 Pleural effusion, not elsewhere classified: Secondary | ICD-10-CM | POA: Diagnosis not present

## 2024-02-13 DIAGNOSIS — Z7189 Other specified counseling: Secondary | ICD-10-CM | POA: Diagnosis not present

## 2024-02-13 NOTE — Progress Notes (Signed)
 Location:  [facility] Room Number: 624/A Place of Service:  ALF [phone]) Provider:  Octavia Heir, NP   Mahlon Gammon, MD  Patient Care Team: Mahlon Gammon, MD as PCP - General (Internal Medicine) [place], Peter M, MD as PCP - Cardiology (Cardiology) Serena Croissant, MD as Consulting Physician (Hematology and Oncology)  Extended Emergency Contact Information Primary Emergency Contact: [name] [place] Home Phone: [phone] Relation: Daughter Secondary Emergency Contact: [name] [place] Home Phone: [phone] Mobile Phone: [phone] Relation: Son  Code Status:  DNR Goals of care: Advanced Directive information    02/12/2024   10:03 AM  Advanced Directives  Does Patient Have a Medical Advance Directive? Yes  Type of Estate agent of [place];Living will;Out of facility DNR (pink MOST or yellow form)  Does patient want to make changes to medical advance directive? No - Patient declined  Copy of Healthcare Power of Attorney in Chart? Yes - validated most recent copy scanned in chart (See row information)     Chief Complaint  Patient presents with   Acute Visit    hemoptysis    HPI:  Pt is a 89 y.o. female seen today for acute visit due to hemoptysis.   She currently resides on the assisted living unit at [facility]. PMH: PE, pulmonary HTN, sinus tachycardia, non-small cell cancer of right lung, left lobe lung cancer s/p radiation, recurrent pleural effusion, osteoporosis, right breast cancer s/p bilateral lumpectomy and OA.   Hospitalized 02/19- 02/21 due to hemoptysis. Initially thought to have had acute PE, but was visualized on past CT in 2024. Hemoptysis is most likely secondary to cancer, radiation and fibrosis. Anticoagulation not recommended. Recent hgb 9.8 02/09/2024.   Today, she reports hemoptysis returned last night. Sputum is bright red, but sometimes  dark brown. She denies chest pain, shortness of breath. Remains on 3 liters oxygen.   H/o sinus tachycardia, remains on metoprolol and levalbuterol nebs prn.   MOST form discussed. She does not want future hospitalizations. She is open to hospice referral if hemoptysis continues and blood counts decline.    Past Medical History:  Diagnosis Date   Allergic rhinitis    Anemia    during pregnancy   Arthritis    Breast cancer (HCC) 05/08/13   right upper inner, invasive mammary   Breast cancer, right breast (HCC) 04/16/2013   Underwent lumpectomy on 11/06/13. 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   Use of letrozole (Femara)    neoadjuvant antiestrogen therapy with letrozole 2.5 mg daily x 7 monhts   Past Surgical History:  Procedure Laterality Date   BREAST BIOPSY Left 1960   lt br bx/benign   BREAST LUMPECTOMY Right    BREAST LUMPECTOMY Right 02/10/2020   Procedure: RIGHT BREAST LUMPECTOMY;  Surgeon: Abigail Miyamoto, MD;  Location: [facility] OR;  Service: General;  Laterality: Right;   BREAST LUMPECTOMY WITH NEEDLE LOCALIZATION Bilateral 11/06/2013   Procedure: BREAST LUMPECTOMY WITH NEEDLE LOCALIZATION;  Surgeon: Currie Paris, MD;  Location: [facility];  Service: General;  Laterality: Bilateral;   COLONOSCOPY     EYE SURGERY Bilateral    both cataracts   MOHS SURGERY Right    nose basal/squamous   THORACENTESIS N/A 08/30/2023   Procedure: THORACENTESIS;  Surgeon: Luciano Cutter, MD;  Location: [facility] ENDOSCOPY;  Service: Pulmonary;   Laterality: N/A;   TOTAL HIP ARTHROPLASTY Left 08/31/2018   Procedure: LEFT TOTAL HIP ARTHROPLASTY ANTERIOR APPROACH;  Surgeon: Kathryne Hitch, MD;  Location: [facility] ORS;  Service: Orthopedics;  Laterality: Left;   VIDEO BRONCHOSCOPY WITH ENDOBRONCHIAL NAVIGATION N/A 11/01/2017   Procedure: VIDEO BRONCHOSCOPY WITH ENDOBRONCHIAL NAVIGATION;  Surgeon: Loreli Slot, MD;  Location: [facility] OR;  Service: Thoracic;  Laterality: N/A;   WRIST SURGERY  1990   lt    Allergies  Allergen Reactions   Other Anaphylaxis    Lobster- "years ago"   Codeine Itching    Insomnia    Hydrocodone Itching and Other (See Comments)    Insomnia   Neosporin [Neomycin-Bacitracin Zn-Polymyx] Rash    Outpatient Encounter Medications as of 02/13/2024  Medication Sig   B Complex-C-Folic Acid (SUPER B COMPLEX/FA/VIT C) TABS Take by mouth.   cetirizine (ZYRTEC) 10 MG tablet Take 10 mg by mouth daily.   Cholecalciferol (VITAMIN D-3) 1000 UNITS CAPS Take 2,000 Units by mouth daily.   escitalopram (LEXAPRO) 10 MG tablet Take 10 mg by mouth daily.   famotidine (PEPCID) 20 MG tablet Take 1 tablet (20 mg total) by mouth daily.   fluticasone (FLONASE) 50 MCG/ACT nasal spray Place 2 sprays into both nostrils daily.   levalbuterol (XOPENEX) 0.63 MG/3ML nebulizer solution Take 3 mLs (0.63 mg total) by nebulization every 6 (six) hours as needed for wheezing or shortness of breath.   metoprolol tartrate (LOPRESSOR) 25 MG tablet Take 0.5 tablets (12.5 mg total) by mouth 2 (two) times daily.   Multiple Vitamins-Minerals (PRESERVISION AREDS 2) CAPS Take 1 mg by mouth in the morning and at bedtime.   nitroGLYCERIN (NITROSTAT) 0.4 MG SL tablet Place 0.4 mg under the tongue every 5 (five) minutes as needed for chest pain.   OXYGEN Inhale 3 L into the lungs as directed. Every Shift; Shift 1, Shift 2, Shift 3   potassium chloride (KLOR-CON M) 10 MEQ tablet Take 10 mEq by mouth daily.   pravastatin (PRAVACHOL) 40 MG tablet Take 40  mg daily by mouth.    torsemide (DEMADEX) 20 MG tablet Take 0.5 tablets (10 mg total) by mouth daily. 1 tablet once a morning   No facility-administered encounter medications on file as of 02/13/2024.    Review of Systems  Constitutional:  Negative for activity change, appetite change and fatigue.  HENT:  Negative for sore throat and trouble swallowing.   Eyes:  Negative for visual disturbance.  Respiratory:  Positive for cough and shortness of breath.        Hemoptysis  Cardiovascular:  Negative for chest pain.  Gastrointestinal:  Negative for abdominal distention and blood in stool.  Genitourinary:  Negative for dysuria and frequency.  Musculoskeletal:  Positive for gait problem.  Skin:  Negative for wound.  Neurological:  Positive for weakness. Negative for dizziness and light-headedness.  Psychiatric/Behavioral:  Negative for dysphoric mood. The patient is not nervous/anxious.     Immunization History  Administered Date(s) Administered  Fluad Quad(high Dose 65+) 09/14/2020   Fluad Trivalent(High Dose 65+) 11/01/2023   Influenza, High Dose Seasonal PF 10/06/2017   Influenza-Unspecified 09/18/2012, 09/27/2013, 09/24/2014, 10/01/2016   Moderna Covid-19 Fall Seasonal Vaccine 72yrs & older 11/02/2023   Moderna Covid-19 Vaccine Bivalent Booster 41yrs & up 04/13/2023   Moderna SARS-COV2 Booster Vaccination 05/21/2021   Moderna Sars-Covid-2 Vaccination 12/28/2019, 01/29/2020   Pneumococcal Conjugate-13 03/18/2014   Pneumococcal Polysaccharide-23 12/04/2008, 02/19/2013   Td 12/04/2008, 04/11/2018   Zoster, Live 12/04/2008   Pertinent  Health Maintenance Due  Topic Date Due   INFLUENZA VACCINE  Completed   DEXA SCAN  Completed   Colonoscopy  Discontinued      03/22/2022   11:00 AM 08/03/2022    4:35 PM 08/25/2023    6:56 AM 10/11/2023    7:16 PM 01/29/2024    2:44 PM  Fall Risk  Falls in the past year?   0 0 0  Was there an injury with Fall?   0 0 0  Fall Risk Category  Calculator   0 0 0  (RETIRED) Patient Fall Risk Level Low fall risk Low fall risk     Patient at Risk for Falls Due to   History of fall(s) Impaired mobility Impaired balance/gait;Impaired mobility  Fall risk Follow up   Falls evaluation completed Falls evaluation completed;Education provided;Falls prevention discussed Falls evaluation completed   Functional Status Survey:    Vitals:   02/13/24 1543  BP: 124/74  Pulse: (!) 110  Resp: 20  Temp: 97.7 F (36.5 C)  SpO2: 95%  Weight: 122 lb (55.3 kg)  Height: 5\' 5"  (1.651 m)   Body mass index is 20.3 kg/m. Physical Exam Vitals reviewed.  Constitutional:      General: She is not in acute distress. HENT:     Head: Normocephalic.     Mouth/Throat:     Mouth: Mucous membranes are moist.     Pharynx: No posterior oropharyngeal erythema.  Eyes:     General:        Right eye: No discharge.        Left eye: No discharge.  Cardiovascular:     Rate and Rhythm: Regular rhythm. Tachycardia present.     Pulses: Normal pulses.     Heart sounds: Normal heart sounds.  Pulmonary:     Effort: Pulmonary effort is normal. No respiratory distress.     Breath sounds: Examination of the right-lower field reveals decreased breath sounds. Examination of the left-lower field reveals decreased breath sounds. Decreased breath sounds present. No wheezing.  Abdominal:     General: Bowel sounds are normal.     Palpations: Abdomen is soft.  Musculoskeletal:     Cervical back: Neck supple.     Right lower leg: No edema.     Left lower leg: No edema.  Lymphadenopathy:     Cervical: No cervical adenopathy.  Skin:    General: Skin is warm.     Capillary Refill: Capillary refill takes less than 2 seconds.  Neurological:     General: No focal deficit present.     Mental Status: She is alert and oriented to person, place, and time.     Gait: Gait abnormal.  Psychiatric:        Mood and Affect: Mood normal.     Labs reviewed: Recent Labs     08/11/23 1307 08/25/23 0000 09/11/23 0000 01/25/24 0000 02/07/24 1039  NA 143   < > 143 141 140  K 4.1   < >  3.9 4.4 4.1  CL 94*   < > 90* 98* 98  CO2 37*   < > 44* 30* 36*  GLUCOSE 99  --   --   --  100*  BUN 17   < > 19 19 23   CREATININE 0.70   < > 0.7 0.8 0.75  CALCIUM 9.6   < > 9.7 9.5 9.5   < > = values in this interval not displayed.   Recent Labs    08/25/23 0000 08/30/23 1611 01/25/24 0000 02/07/24 1039  AST 32  --  16 18  ALT 14  --  11 12  ALKPHOS 61  --  66 53  BILITOT  --   --   --  0.6  PROT  --  6.6  --  6.5  ALBUMIN 3.7  --  3.5 3.3*   Recent Labs    02/07/24 1039 02/08/24 0553 02/08/24 1648 02/09/24 0837  WBC 6.8 5.4 5.8 4.6  NEUTROABS 5.1  --   --   --   HGB 10.6* 9.8* 10.1* 9.8*  HCT 34.1* 32.6* 33.1* 32.9*  MCV 90.0 92.9 91.4 93.2  PLT 164 145* 165 160   Lab Results  Component Value Date   TSH 2.40 08/25/2023   Lab Results  Component Value Date   HGBA1C 5.6 12/25/2017   Lab Results  Component Value Date   CHOL 148 01/25/2024   HDL 65 01/25/2024   LDLCALC 70 01/25/2024   TRIG 65 01/25/2024   CHOLHDL 3.1 12/26/2017    Significant Diagnostic Results in last 30 days:  ECHOCARDIOGRAM COMPLETE Result Date: 02/08/2024    ECHOCARDIOGRAM REPORT   Patient Name:   CHELSYE SUHRE Doria Date of Exam: 02/07/2024 Medical Rec #:  [id number]        Height:       65.0 in Accession #:    [id number]       Weight:       122.0 lb Date of Birth:  January 26, 1926        BSA:          1.603 m Patient Age:    89 years         BP:           120/69 mmHg Patient Gender: F                HR:           106 bpm. Exam Location:  Inpatient Procedure: 2D Echo, Color Doppler and Cardiac Doppler (Both Spectral and Color            Flow Doppler were utilized during procedure). Indications:    Pulmonary Embolus  History:        Patient has prior history of Echocardiogram examinations, most                 recent 05/29/2020.  Sonographer:    Harriette Bouillon RDCS Referring Phys: [id number]  Azucena Fallen IMPRESSIONS  1. Left ventricular ejection fraction, by estimation, is 65 to 70%. The left ventricle has normal function. The left ventricle has no regional wall motion abnormalities. Indeterminate diastolic filling due to E-A fusion.  2. Right ventricular systolic function is normal. The right ventricular size is normal. There is mildly elevated pulmonary artery systolic pressure. The estimated right ventricular systolic pressure is 40.7 mmHg.  3. Left atrial size was mildly dilated.  4. Right atrial size was mildly dilated.  5. The mitral valve is degenerative. Mild  to moderate mitral valve regurgitation. No evidence of mitral stenosis.  6. Tricuspid valve regurgitation is mild to moderate.  7. The aortic valve is abnormal. There is mild calcification of the aortic valve. There is moderate thickening of the aortic valve. Aortic valve regurgitation is mild. Aortic valve sclerosis/calcification is present, without any evidence of aortic stenosis. DVI 0.54, SVI 15.  8. The inferior vena cava is normal in size with greater than 50% respiratory variability, suggesting right atrial pressure of 3 mmHg. FINDINGS  Left Ventricle: Left ventricular ejection fraction, by estimation, is 65 to 70%. The left ventricle has normal function. The left ventricle has no regional wall motion abnormalities. The left ventricular internal cavity size was normal in size. There is  no left ventricular hypertrophy. Indeterminate diastolic filling due to E-A fusion. Right Ventricle: The right ventricular size is normal. No increase in right ventricular wall thickness. Right ventricular systolic function is normal. There is mildly elevated pulmonary artery systolic pressure. The tricuspid regurgitant velocity is 3.07  m/s, and with an assumed right atrial pressure of 3 mmHg, the estimated right ventricular systolic pressure is 40.7 mmHg. Left Atrium: Left atrial size was mildly dilated. Right Atrium: Right atrial size was  mildly dilated. Pericardium: There is no evidence of pericardial effusion. Mitral Valve: The mitral valve is degenerative in appearance. Mild to moderate mitral valve regurgitation. No evidence of mitral valve stenosis. The mean mitral valve gradient is 4.2 mmHg with average heart rate of 102 bpm. Tricuspid Valve: The tricuspid valve is normal in structure. Tricuspid valve regurgitation is mild to moderate. No evidence of tricuspid stenosis. Aortic Valve: The aortic valve is abnormal. There is mild calcification of the aortic valve. There is moderate thickening of the aortic valve. Aortic valve regurgitation is mild. Aortic regurgitation PHT measures 206 msec. Aortic valve sclerosis/calcification is present, without any evidence of aortic stenosis. Aortic valve mean gradient measures 6.0 mmHg. Aortic valve peak gradient measures 10.6 mmHg. Aortic valve area, by VTI measures 0.84 cm. Pulmonic Valve: The pulmonic valve was normal in structure. Pulmonic valve regurgitation is not visualized. No evidence of pulmonic stenosis. Aorta: The aortic root is normal in size and structure. Venous: The inferior vena cava is normal in size with greater than 50% respiratory variability, suggesting right atrial pressure of 3 mmHg. IAS/Shunts: The interatrial septum was not well visualized.  LEFT VENTRICLE PLAX 2D LVIDd:         4.30 cm LVIDs:         2.65 cm LV PW:         0.70 cm LV IVS:        0.70 cm LVOT diam:     1.40 cm LV SV:         24 LV SV Index:   15 LVOT Area:     1.54 cm  RIGHT VENTRICLE             IVC RV S prime:     10.10 cm/s  IVC diam: 1.90 cm TAPSE (M-mode): 1.7 cm LEFT ATRIUM             Index        RIGHT ATRIUM           Index LA diam:        3.40 cm 2.12 cm/m   RA Area:     11.90 cm LA Vol (A2C):   26.5 ml 16.53 ml/m  RA Volume:   26.90 ml  16.78 ml/m LA Vol (A4C):  49.3 ml 30.75 ml/m LA Biplane Vol: 36.3 ml 22.64 ml/m  AORTIC VALVE AV Area (Vmax):    0.72 cm AV Area (Vmean):   0.78 cm AV Area  (VTI):     0.84 cm AV Vmax:           162.88 cm/s AV Vmean:          116.411 cm/s AV VTI:            0.289 m AV Peak Grad:      10.6 mmHg AV Mean Grad:      6.0 mmHg LVOT Vmax:         76.00 cm/s LVOT Vmean:        59.000 cm/s LVOT VTI:          0.157 m LVOT/AV VTI ratio: 0.54 AI PHT:            206 msec  AORTA Ao Root diam: 2.90 cm Ao Asc diam:  2.20 cm MITRAL VALVE              TRICUSPID VALVE MV Mean grad: 4.2 mmHg    TR Peak grad:   37.7 mmHg MV E velocity: 4.57 cm/s  TR Vmax:        307.00 cm/s                            SHUNTS                           Systemic VTI:  0.16 m                           Systemic Diam: 1.40 cm Weston Brass MD Electronically signed by Weston Brass MD Signature Date/Time: 02/08/2024/12:19:37 PM    Final    CT Chest W Contrast Result Date: 02/07/2024 CLINICAL DATA:  Small cell lung cancer. EXAM: CT CHEST WITH CONTRAST TECHNIQUE: Multidetector CT imaging of the chest was performed during intravenous contrast administration. RADIATION DOSE REDUCTION: This exam was performed according to the departmental dose-optimization program which includes automated exposure control, adjustment of the mA and/or kV according to patient size and/or use of iterative reconstruction technique. CONTRAST:  75mL OMNIPAQUE IOHEXOL 300 MG/ML  SOLN COMPARISON:  Chest CT dated 01/25/2024. FINDINGS: Cardiovascular: There is no cardiomegaly or pericardial effusion. Coronary vascular calcification of the LAD. Mild atherosclerotic calcification of the thoracic aorta. No aneurysmal dilatation or dissection. The origins of the great vessels of the aortic arch appear patent. There is a large pulmonary embolus in the left pulmonary artery extending into the lobar branches. This may represent a bland thrombus versus tumor thrombus. The RV/LV ratio is approximately 1.0. Mediastinum/Nodes: No obvious right hilar or mediastinal adenopathy. Evaluation of the left hilum is limited due to consolidative changes of the  left lung. The esophagus is grossly unremarkable. No mediastinal fluid collection. Lungs/Pleura: Similar appearance of a loculated appearing left pleural effusion. Left perihilar consolidative changes of the majority of the left lower lobe with volume loss as seen on the prior CT. There is occlusion of the left lower lobe bronchi. A centrally occlusive mass is not excluded. Small right pleural effusion as seen previously.  No pneumothorax. Upper Abdomen: Gallstones. Musculoskeletal: Osteopenia. No acute osseous pathology. Asymmetric right retroareolar soft tissue nodule. IMPRESSION: 1. Left central and lobar pulmonary artery embolus with evidence of right heart straining. 2. Similar  appearance of a loculated appearing left pleural effusion. 3. Left perihilar consolidation/mass with occlusion of the left lower lobe bronchi and complete left lower lobe collapse. 4. Small right pleural effusion as seen previously. 5. Cholelithiasis. 6.  Aortic Atherosclerosis (ICD10-I70.0). These results were called by telephone at the time of interpretation on 02/07/2024 at 12:59 pm to Dr. Lorre Nick , who verbally acknowledged these results. Electronically Signed   By: Elgie Collard M.D.   On: 02/07/2024 13:00   DG Chest Port 1 View Result Date: 02/07/2024 CLINICAL DATA:  Cough. EXAM: PORTABLE CHEST 1 VIEW COMPARISON:  CT scan chest from 01/25/2024. FINDINGS: Essentially stable exam. Redemonstration of left lower hemithorax opacification which corresponds to left pleural effusion and associated left lung compressive atelectasis. There is linear opacity along the right mid lung zone, which corresponds to atelectasis/scarring along the minor fissure, unchanged. Bilateral lung fields are otherwise clear. Right lateral costophrenic angle is clear. Evaluation of cardiomediastinal silhouette is nondiagnostic due to left lower hemithorax opacification. No acute osseous abnormalities. The soft tissues are within normal limits.  IMPRESSION: *Essentially stable exam, as described above. No acute cardiopulmonary abnormality. *Persistent left lower hemithorax opacification. Electronically Signed   By: Jules Schick M.D.   On: 02/07/2024 11:12   CT Chest Wo Contrast Result Date: 01/31/2024 CLINICAL DATA:  Non-small cell lung cancer (NSCLC), monitor continued surveillance for h/o NSCLC, now 5 years disease free s/p treatment. * Tracking Code: BO * EXAM: CT CHEST WITHOUT CONTRAST TECHNIQUE: Multidetector CT imaging of the chest was performed following the standard protocol without IV contrast. RADIATION DOSE REDUCTION: This exam was performed according to the departmental dose-optimization program which includes automated exposure control, adjustment of the mA and/or kV according to patient size and/or use of iterative reconstruction technique. COMPARISON:  CT angiography chest from 08/29/2023. FINDINGS: Cardiovascular: Normal cardiac size. No pericardial effusion. No aortic aneurysm. There are coronary artery calcifications, in keeping with coronary artery disease. There are also mild-to-moderate peripheral atherosclerotic vascular calcifications of thoracic aorta and its major branches. Mediastinum/Nodes: Visualized thyroid gland appears grossly unremarkable. No solid / cystic mediastinal masses. The esophagus is nondistended precluding optimal assessment. There are few mildly prominent mediastinal lymph nodes, which do not meet the size criteria for lymphadenopathy and appear decreased in size since the prior study. No axillary lymphadenopathy by size criteria. Evaluation of bilateral hila is limited due to lack on intravenous contrast: however, no large hilar lymphadenopathy identified. Lungs/Pleura: The trachea and right bronchial tree is patent. There is gradual complete occlusion of the left lung lower lobe bronchus, similar to the prior study. There is associated complete collapse of the left lung lower lobe, similar to the prior  study. There is associated small-to-moderate chronic left pleural effusion with associated pleural thickening. There is linear area of scarring/atelectasis along the minor fissure, similar to the prior study. There is focal scarring along the left lung upper lobe, laterally, likely from prior surgery. No new lung mass, consolidation or pneumothorax. No suspicious lung nodule. There is trace right pleural effusion. There are patchy areas of mosaic attenuation of lungs, consistent with heterogeneous air trapping related to small airways disease. Upper Abdomen: Partially seen single calcified gallstone without overt signs of acute cholecystitis in the visualized portion. There is a small sliding hiatal hernia. There is a 8 by 11 mm left adrenal nodule, incompletely characterized on the current examination but unchanged since the prior study and favored to represent an adenoma. Remaining visualized upper abdominal viscera within normal limits. Musculoskeletal:  The visualized soft tissues of the chest wall are grossly unremarkable. There is diffuse osteopenia of the visualized osseous structures. No suspicious osseous lesions. There are mild multilevel degenerative changes in the visualized spine. IMPRESSION: 1. Essentially stable exam. 2. Redemonstration of complete collapse of the left lung lower lobe and associated small-to-moderate chronic left pleural effusion with thickening. 3. Linear area of scarring/atelectasis along the minor fissure and focal scarring along the left lung upper lobe, similar to the prior study. 4. No new lung mass, consolidation, pneumothorax or suspicious lung nodule. 5. Multiple other nonacute observations, as described above. Aortic Atherosclerosis (ICD10-I70.0). Electronically Signed   By: Jules Schick M.D.   On: 01/31/2024 09:06    Assessment/Plan 1. Hemoptysis (Primary) - ongoing - hospitalized 02/19-02/21 - secondary to cancer, radiation and fibrosis - hgb 9.8 02/09/2024 -  diminished lung sounds on exam - stat cbc/diff> hgb was 10.2 - stat CXR - symptoms improved with levalbuterol > will schedule BID x 2 days then prn  2. Sinus tachycardia - ongoing - on metoprolol  3. ACP (advance care planning) - MOST form filled out, will verify with HPOA/daughter - does not want future hospitalizations - discussed hospice services if condition declines  4. Left pleural effusion - acute cough and diminished lung sounds - CXR> moderate left effusion - will increase torsemide to 30 mg po QAM x 2 days, then resume 10 mg daily - will increase KCL to 20 meq po QAM x 2 days, then resume 10 meq  Family/ staff Communication: plan discussed with patient and nursw  Labs/tests ordered:  stat cbc/diff and CXR

## 2024-02-16 ENCOUNTER — Encounter: Payer: Self-pay | Admitting: Adult Health

## 2024-02-16 ENCOUNTER — Non-Acute Institutional Stay: Payer: Self-pay | Admitting: Adult Health

## 2024-02-16 DIAGNOSIS — R042 Hemoptysis: Secondary | ICD-10-CM | POA: Diagnosis not present

## 2024-02-16 DIAGNOSIS — J90 Pleural effusion, not elsewhere classified: Secondary | ICD-10-CM

## 2024-02-16 DIAGNOSIS — I2699 Other pulmonary embolism without acute cor pulmonale: Secondary | ICD-10-CM

## 2024-02-16 DIAGNOSIS — I272 Pulmonary hypertension, unspecified: Secondary | ICD-10-CM | POA: Diagnosis not present

## 2024-02-16 MED ORDER — LEVALBUTEROL HCL 0.63 MG/3ML IN NEBU: 0.6300 mg | INHALATION_SOLUTION | Freq: Two times a day (BID) | RESPIRATORY_TRACT | Status: DC

## 2024-02-16 MED ORDER — MORPHINE SULFATE (CONCENTRATE) 20 MG/ML PO SOLN: 5.0000 mg | ORAL | 0 refills | Status: DC | PRN

## 2024-02-16 MED ORDER — TORSEMIDE 20 MG PO TABS: 20.0000 mg | ORAL_TABLET | Freq: Every day | ORAL | Status: AC

## 2024-02-16 NOTE — Progress Notes (Signed)
 Location:  Medical illustrator of Service:  ALF (13) Provider:   Peggye Ley, ANP [facility] [phone]   Mahlon Gammon, MD  Patient Care Team: Mahlon Gammon, MD as PCP - General (Internal Medicine) [place], Peter M, MD as PCP - Cardiology (Cardiology) Serena Croissant, MD as Consulting Physician (Hematology and Oncology)  Extended Emergency Contact Information Primary Emergency Contact: [name] [place] Home Phone: [phone] Relation: Daughter Secondary Emergency Contact: [name] [place] Home Phone: [phone] Mobile Phone: [phone] Relation: Son  Code Status:  DNR Goals of care: Advanced Directive information    02/12/2024   10:03 AM  Advanced Directives  Does Patient Have a Medical Advance Directive? Yes  Type of Estate agent of [place];Living will;Out of facility DNR (pink MOST or yellow form)  Does patient want to make changes to medical advance directive? No - Patient declined  Copy of Healthcare Power of Attorney in Chart? Yes - validated most recent copy scanned in chart (See row information)     Chief Complaint  Patient presents with   Acute Visit    F/u hemoptysis    HPI:  Pt is a 89 y.o. female seen today for an acute visit for hemoptysis  PMH significant for pleural effusion biltateral with thoracentesis on the right 08/2023  Other issues are a hx of breast ca on the right s/p bilateral lumpectomy in 2014 with recurrence in 2021 on the right s/p another right lumpectomy, given estrogen therapy and chemo. In 2018 she was found to have NSCLC on the right and received radiation  Pulmonary HTN 2D echo 08/01/23 Ef 60-65%, severe pulmonary HTN and severe TR   Hospitalized due to hemoptysis 02/07/24-02/09/24 due to hemoptysis. Improved to discharge back to enhanced AL after receiving clotting agent through nebulizer. CT of the chest showed a left  entral lobar pulmonary arter embolus with evidence of right heart strain. Loculated left pleural effusion. Small right effusion. Cholelithiasis.  The thrombus has been chronic but now progressing. Had previously revascularized. Due to the hemoptysis they opted not to anticoagulate her. There is concern the bleeding could be due to radiation damage or cancer.   Seen by Amy on 2/25 due to hemoptysis and tachycardia.  CXR showed CHF and chronic left effusion Torsemide increased for 2 days and xopenex scheduled Hemoptysis has resolved She feels the nebs help her breathing and would like them scheduled. Sats are normal on 3 liters of oxygen. She has chronic DOE which she feels is unchanged. NO significant weight gain or edema.  HR remains elevated at times 105.  Has DNR orders, no hospitalizations. Holding off on hospice but is open to it if declining. Nursing requests prn morphine in case she worsens due to unresolved PE.    Past Medical History:  Diagnosis Date   Allergic rhinitis    Anemia    during pregnancy   Arthritis    Breast cancer (HCC) 05/08/13   right upper inner, invasive mammary   Breast cancer, right breast (HCC) 04/16/2013   Underwent lumpectomy on 11/06/13. 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   Use of letrozole (Femara)    neoadjuvant antiestrogen therapy with letrozole 2.5 mg daily x 7 monhts   Past Surgical History:  Procedure Laterality Date   BREAST BIOPSY Left 1960   lt br bx/benign   BREAST LUMPECTOMY Right    BREAST LUMPECTOMY Right  02/10/2020   Procedure: RIGHT BREAST LUMPECTOMY;  Surgeon: Abigail Miyamoto, MD;  Location: [facility] OR;  Service: General;  Laterality: Right;   BREAST LUMPECTOMY WITH NEEDLE LOCALIZATION Bilateral 11/06/2013   Procedure: BREAST LUMPECTOMY WITH NEEDLE LOCALIZATION;  Surgeon: Currie Paris, MD;  Location: [facility];  Service: General;  Laterality: Bilateral;   COLONOSCOPY     EYE SURGERY Bilateral    both cataracts   MOHS SURGERY Right    nose basal/squamous   THORACENTESIS N/A 08/30/2023   Procedure: THORACENTESIS;  Surgeon: Luciano Cutter, MD;  Location: [facility] ENDOSCOPY;  Service: Pulmonary;  Laterality: N/A;   TOTAL HIP ARTHROPLASTY Left 08/31/2018   Procedure: LEFT TOTAL HIP ARTHROPLASTY ANTERIOR APPROACH;  Surgeon: Kathryne Hitch, MD;  Location: [facility] ORS;  Service: Orthopedics;  Laterality: Left;   VIDEO BRONCHOSCOPY WITH ENDOBRONCHIAL NAVIGATION N/A 11/01/2017   Procedure: VIDEO BRONCHOSCOPY WITH ENDOBRONCHIAL NAVIGATION;  Surgeon: Loreli Slot, MD;  Location: [facility] OR;  Service: Thoracic;  Laterality: N/A;   WRIST SURGERY  1990   lt    Allergies  Allergen Reactions   Other Anaphylaxis    Lobster- "years ago"   Codeine Itching    Insomnia    Hydrocodone Itching and Other (See Comments)    Insomnia   Neosporin [Neomycin-Bacitracin Zn-Polymyx] Rash    Outpatient Encounter Medications as of 02/16/2024  Medication Sig   levalbuterol (XOPENEX) 0.63 MG/3ML nebulizer solution Take 3 mLs (0.63 mg total) by nebulization 2 (two) times daily.   morphine (ROXANOL) 20 MG/ML concentrated solution Take 0.25 mLs (5 mg total) by mouth every 4 (four) hours as needed for severe pain (pain score 7-10) or shortness of breath. Reports she has tolerated morphine in the past with no s/e Monitor for s/e of itching when administering.   B Complex-C-Folic Acid (SUPER B COMPLEX/FA/VIT C) TABS Take by mouth.   cetirizine (ZYRTEC) 10 MG tablet Take 10 mg by mouth daily.    Cholecalciferol (VITAMIN D-3) 1000 UNITS CAPS Take 2,000 Units by mouth daily.   escitalopram (LEXAPRO) 10 MG tablet Take 10 mg by mouth daily.   famotidine (PEPCID) 20 MG tablet Take 1 tablet (20 mg total) by mouth daily.   fluticasone (FLONASE) 50 MCG/ACT nasal spray Place 2 sprays into both nostrils daily.   levalbuterol (XOPENEX) 0.63 MG/3ML nebulizer solution Take 3 mLs (0.63 mg total) by nebulization every 6 (six) hours as needed for wheezing or shortness of breath.   metoprolol tartrate (LOPRESSOR) 25 MG tablet Take 0.5 tablets (12.5 mg total) by mouth 2 (two) times daily.   Multiple Vitamins-Minerals (PRESERVISION AREDS 2) CAPS Take 1 mg by mouth in the morning and at bedtime.   nitroGLYCERIN (NITROSTAT) 0.4 MG SL tablet Place 0.4 mg under the tongue every 5 (five) minutes as needed for chest pain.   OXYGEN Inhale 3 L into the lungs as directed. Every Shift; Shift 1, Shift 2, Shift 3  potassium chloride (KLOR-CON M) 10 MEQ tablet Take 10 mEq by mouth daily.   pravastatin (PRAVACHOL) 40 MG tablet Take 40 mg daily by mouth.    torsemide (DEMADEX) 20 MG tablet Take 1 tablet (20 mg total) by mouth daily. 1 tablet once a morning   [DISCONTINUED] torsemide (DEMADEX) 20 MG tablet Take 0.5 tablets (10 mg total) by mouth daily. 1 tablet once a morning   No facility-administered encounter medications on file as of 02/16/2024.    Review of Systems  Constitutional:  Positive for activity change and fatigue. Negative for appetite change, chills, diaphoresis, fever and unexpected weight change.  HENT:  Negative for congestion.   Respiratory:  Positive for cough and shortness of breath. Negative for wheezing.   Cardiovascular:  Negative for chest pain, palpitations and leg swelling.  Gastrointestinal:  Negative for abdominal distention, abdominal pain, constipation and diarrhea.  Genitourinary:  Negative for difficulty urinating and dysuria.  Musculoskeletal:  Positive for gait problem. Negative for  arthralgias, back pain, joint swelling and myalgias.  Neurological:  Negative for dizziness, tremors, seizures, syncope, facial asymmetry, speech difficulty, weakness, light-headedness, numbness and headaches.  Psychiatric/Behavioral:  Negative for agitation, behavioral problems and confusion.     Immunization History  Administered Date(s) Administered   Fluad Quad(high Dose 65+) 09/14/2020   Fluad Trivalent(High Dose 65+) 11/01/2023   Influenza, High Dose Seasonal PF 10/06/2017   Influenza-Unspecified 09/18/2012, 09/27/2013, 09/24/2014, 10/01/2016   Moderna Covid-19 Fall Seasonal Vaccine 41yrs & older 11/02/2023   Moderna Covid-19 Vaccine Bivalent Booster 2yrs & up 04/13/2023   Moderna SARS-COV2 Booster Vaccination 05/21/2021   Moderna Sars-Covid-2 Vaccination 12/28/2019, 01/29/2020   Pneumococcal Conjugate-13 03/18/2014   Pneumococcal Polysaccharide-23 12/04/2008, 02/19/2013   Td 12/04/2008, 04/11/2018   Zoster, Live 12/04/2008   Pertinent  Health Maintenance Due  Topic Date Due   INFLUENZA VACCINE  Completed   DEXA SCAN  Completed   Colonoscopy  Discontinued      03/22/2022   11:00 AM 08/03/2022    4:35 PM 08/25/2023    6:56 AM 10/11/2023    7:16 PM 01/29/2024    2:44 PM  Fall Risk  Falls in the past year?   0 0 0  Was there an injury with Fall?   0 0 0  Fall Risk Category Calculator   0 0 0  (RETIRED) Patient Fall Risk Level Low fall risk Low fall risk     Patient at Risk for Falls Due to   History of fall(s) Impaired mobility Impaired balance/gait;Impaired mobility  Fall risk Follow up   Falls evaluation completed Falls evaluation completed;Education provided;Falls prevention discussed Falls evaluation completed   Functional Status Survey:    Vitals:   02/16/24 1327  BP: (!) 105/58  Pulse: (!) 105  Resp: 20  Temp: 97.7 F (36.5 C)  SpO2: 99%  Weight: 122 lb (55.3 kg)   Body mass index is 20.3 kg/m. Physical Exam Vitals and nursing note reviewed.   Constitutional:      General: She is not in acute distress.    Appearance: She is not diaphoretic.  HENT:     Head: Normocephalic and atraumatic.  Neck:     Vascular: No JVD.  Cardiovascular:     Rate and Rhythm: Regular rhythm. Tachycardia present.     Heart sounds: Murmur heard.  Pulmonary:     Effort: Pulmonary effort is normal. No respiratory distress.     Breath sounds: Wheezing (Right upper and middle lobe.) present.     Comments:  Decreased left base Skin:    General: Skin is warm and dry.  Neurological:     Mental Status: She is alert and oriented to person, place, and time.  Psychiatric:        Mood and Affect: Mood normal.     Labs reviewed: Recent Labs    08/11/23 1307 08/25/23 0000 09/11/23 0000 01/25/24 0000 02/07/24 1039  NA 143   < > 143 141 140  K 4.1   < > 3.9 4.4 4.1  CL 94*   < > 90* 98* 98  CO2 37*   < > 44* 30* 36*  GLUCOSE 99  --   --   --  100*  BUN 17   < > 19 19 23   CREATININE 0.70   < > 0.7 0.8 0.75  CALCIUM 9.6   < > 9.7 9.5 9.5   < > = values in this interval not displayed.   Recent Labs    08/25/23 0000 08/30/23 1611 01/25/24 0000 02/07/24 1039  AST 32  --  16 18  ALT 14  --  11 12  ALKPHOS 61  --  66 53  BILITOT  --   --   --  0.6  PROT  --  6.6  --  6.5  ALBUMIN 3.7  --  3.5 3.3*   Recent Labs    02/07/24 1039 02/08/24 0553 02/08/24 1648 02/09/24 0837  WBC 6.8 5.4 5.8 4.6  NEUTROABS 5.1  --   --   --   HGB 10.6* 9.8* 10.1* 9.8*  HCT 34.1* 32.6* 33.1* 32.9*  MCV 90.0 92.9 91.4 93.2  PLT 164 145* 165 160   Lab Results  Component Value Date   TSH 2.40 08/25/2023   Lab Results  Component Value Date   HGBA1C 5.6 12/25/2017   Lab Results  Component Value Date   CHOL 148 01/25/2024   HDL 65 01/25/2024   LDLCALC 70 01/25/2024   TRIG 65 01/25/2024   CHOLHDL 3.1 12/26/2017    Significant Diagnostic Results in last 30 days:  ECHOCARDIOGRAM COMPLETE Result Date: 02/08/2024    ECHOCARDIOGRAM REPORT   Patient  Name:   CERENITI CURB Bergeman Date of Exam: 02/07/2024 Medical Rec #:  [id number]        Height:       65.0 in Accession #:    [id number]       Weight:       122.0 lb Date of Birth:  September 26, 1926        BSA:          1.603 m Patient Age:    89 years         BP:           120/69 mmHg Patient Gender: F                HR:           106 bpm. Exam Location:  Inpatient Procedure: 2D Echo, Color Doppler and Cardiac Doppler (Both Spectral and Color            Flow Doppler were utilized during procedure). Indications:    Pulmonary Embolus  History:        Patient has prior history of Echocardiogram examinations, most                 recent 05/29/2020.  Sonographer:    Harriette Bouillon RDCS Referring Phys: [id number] Azucena Fallen IMPRESSIONS  1. Left ventricular ejection fraction,  by estimation, is 65 to 70%. The left ventricle has normal function. The left ventricle has no regional wall motion abnormalities. Indeterminate diastolic filling due to E-A fusion.  2. Right ventricular systolic function is normal. The right ventricular size is normal. There is mildly elevated pulmonary artery systolic pressure. The estimated right ventricular systolic pressure is 40.7 mmHg.  3. Left atrial size was mildly dilated.  4. Right atrial size was mildly dilated.  5. The mitral valve is degenerative. Mild to moderate mitral valve regurgitation. No evidence of mitral stenosis.  6. Tricuspid valve regurgitation is mild to moderate.  7. The aortic valve is abnormal. There is mild calcification of the aortic valve. There is moderate thickening of the aortic valve. Aortic valve regurgitation is mild. Aortic valve sclerosis/calcification is present, without any evidence of aortic stenosis. DVI 0.54, SVI 15.  8. The inferior vena cava is normal in size with greater than 50% respiratory variability, suggesting right atrial pressure of 3 mmHg. FINDINGS  Left Ventricle: Left ventricular ejection fraction, by estimation, is 65 to 70%. The left ventricle has  normal function. The left ventricle has no regional wall motion abnormalities. The left ventricular internal cavity size was normal in size. There is  no left ventricular hypertrophy. Indeterminate diastolic filling due to E-A fusion. Right Ventricle: The right ventricular size is normal. No increase in right ventricular wall thickness. Right ventricular systolic function is normal. There is mildly elevated pulmonary artery systolic pressure. The tricuspid regurgitant velocity is 3.07  m/s, and with an assumed right atrial pressure of 3 mmHg, the estimated right ventricular systolic pressure is 40.7 mmHg. Left Atrium: Left atrial size was mildly dilated. Right Atrium: Right atrial size was mildly dilated. Pericardium: There is no evidence of pericardial effusion. Mitral Valve: The mitral valve is degenerative in appearance. Mild to moderate mitral valve regurgitation. No evidence of mitral valve stenosis. The mean mitral valve gradient is 4.2 mmHg with average heart rate of 102 bpm. Tricuspid Valve: The tricuspid valve is normal in structure. Tricuspid valve regurgitation is mild to moderate. No evidence of tricuspid stenosis. Aortic Valve: The aortic valve is abnormal. There is mild calcification of the aortic valve. There is moderate thickening of the aortic valve. Aortic valve regurgitation is mild. Aortic regurgitation PHT measures 206 msec. Aortic valve sclerosis/calcification is present, without any evidence of aortic stenosis. Aortic valve mean gradient measures 6.0 mmHg. Aortic valve peak gradient measures 10.6 mmHg. Aortic valve area, by VTI measures 0.84 cm. Pulmonic Valve: The pulmonic valve was normal in structure. Pulmonic valve regurgitation is not visualized. No evidence of pulmonic stenosis. Aorta: The aortic root is normal in size and structure. Venous: The inferior vena cava is normal in size with greater than 50% respiratory variability, suggesting right atrial pressure of 3 mmHg. IAS/Shunts: The  interatrial septum was not well visualized.  LEFT VENTRICLE PLAX 2D LVIDd:         4.30 cm LVIDs:         2.65 cm LV PW:         0.70 cm LV IVS:        0.70 cm LVOT diam:     1.40 cm LV SV:         24 LV SV Index:   15 LVOT Area:     1.54 cm  RIGHT VENTRICLE             IVC RV S prime:     10.10 cm/s  IVC diam: 1.90 cm  TAPSE (M-mode): 1.7 cm LEFT ATRIUM             Index        RIGHT ATRIUM           Index LA diam:        3.40 cm 2.12 cm/m   RA Area:     11.90 cm LA Vol (A2C):   26.5 ml 16.53 ml/m  RA Volume:   26.90 ml  16.78 ml/m LA Vol (A4C):   49.3 ml 30.75 ml/m LA Biplane Vol: 36.3 ml 22.64 ml/m  AORTIC VALVE AV Area (Vmax):    0.72 cm AV Area (Vmean):   0.78 cm AV Area (VTI):     0.84 cm AV Vmax:           162.88 cm/s AV Vmean:          116.411 cm/s AV VTI:            0.289 m AV Peak Grad:      10.6 mmHg AV Mean Grad:      6.0 mmHg LVOT Vmax:         76.00 cm/s LVOT Vmean:        59.000 cm/s LVOT VTI:          0.157 m LVOT/AV VTI ratio: 0.54 AI PHT:            206 msec  AORTA Ao Root diam: 2.90 cm Ao Asc diam:  2.20 cm MITRAL VALVE              TRICUSPID VALVE MV Mean grad: 4.2 mmHg    TR Peak grad:   37.7 mmHg MV E velocity: 4.57 cm/s  TR Vmax:        307.00 cm/s                            SHUNTS                           Systemic VTI:  0.16 m                           Systemic Diam: 1.40 cm Weston Brass MD Electronically signed by Weston Brass MD Signature Date/Time: 02/08/2024/12:19:37 PM    Final    CT Chest W Contrast Result Date: 02/07/2024 CLINICAL DATA:  Small cell lung cancer. EXAM: CT CHEST WITH CONTRAST TECHNIQUE: Multidetector CT imaging of the chest was performed during intravenous contrast administration. RADIATION DOSE REDUCTION: This exam was performed according to the departmental dose-optimization program which includes automated exposure control, adjustment of the mA and/or kV according to patient size and/or use of iterative reconstruction technique. CONTRAST:  75mL  OMNIPAQUE IOHEXOL 300 MG/ML  SOLN COMPARISON:  Chest CT dated 01/25/2024. FINDINGS: Cardiovascular: There is no cardiomegaly or pericardial effusion. Coronary vascular calcification of the LAD. Mild atherosclerotic calcification of the thoracic aorta. No aneurysmal dilatation or dissection. The origins of the great vessels of the aortic arch appear patent. There is a large pulmonary embolus in the left pulmonary artery extending into the lobar branches. This may represent a bland thrombus versus tumor thrombus. The RV/LV ratio is approximately 1.0. Mediastinum/Nodes: No obvious right hilar or mediastinal adenopathy. Evaluation of the left hilum is limited due to consolidative changes of the left lung. The esophagus is grossly unremarkable. No mediastinal fluid collection. Lungs/Pleura: Similar appearance of  a loculated appearing left pleural effusion. Left perihilar consolidative changes of the majority of the left lower lobe with volume loss as seen on the prior CT. There is occlusion of the left lower lobe bronchi. A centrally occlusive mass is not excluded. Small right pleural effusion as seen previously.  No pneumothorax. Upper Abdomen: Gallstones. Musculoskeletal: Osteopenia. No acute osseous pathology. Asymmetric right retroareolar soft tissue nodule. IMPRESSION: 1. Left central and lobar pulmonary artery embolus with evidence of right heart straining. 2. Similar appearance of a loculated appearing left pleural effusion. 3. Left perihilar consolidation/mass with occlusion of the left lower lobe bronchi and complete left lower lobe collapse. 4. Small right pleural effusion as seen previously. 5. Cholelithiasis. 6.  Aortic Atherosclerosis (ICD10-I70.0). These results were called by telephone at the time of interpretation on 02/07/2024 at 12:59 pm to Dr. Lorre Nick , who verbally acknowledged these results. Electronically Signed   By: Elgie Collard M.D.   On: 02/07/2024 13:00   DG Chest Port 1 View Result  Date: 02/07/2024 CLINICAL DATA:  Cough. EXAM: PORTABLE CHEST 1 VIEW COMPARISON:  CT scan chest from 01/25/2024. FINDINGS: Essentially stable exam. Redemonstration of left lower hemithorax opacification which corresponds to left pleural effusion and associated left lung compressive atelectasis. There is linear opacity along the right mid lung zone, which corresponds to atelectasis/scarring along the minor fissure, unchanged. Bilateral lung fields are otherwise clear. Right lateral costophrenic angle is clear. Evaluation of cardiomediastinal silhouette is nondiagnostic due to left lower hemithorax opacification. No acute osseous abnormalities. The soft tissues are within normal limits. IMPRESSION: *Essentially stable exam, as described above. No acute cardiopulmonary abnormality. *Persistent left lower hemithorax opacification. Electronically Signed   By: Jules Schick M.D.   On: 02/07/2024 11:12   CT Chest Wo Contrast Result Date: 01/31/2024 CLINICAL DATA:  Non-small cell lung cancer (NSCLC), monitor continued surveillance for h/o NSCLC, now 5 years disease free s/p treatment. * Tracking Code: BO * EXAM: CT CHEST WITHOUT CONTRAST TECHNIQUE: Multidetector CT imaging of the chest was performed following the standard protocol without IV contrast. RADIATION DOSE REDUCTION: This exam was performed according to the departmental dose-optimization program which includes automated exposure control, adjustment of the mA and/or kV according to patient size and/or use of iterative reconstruction technique. COMPARISON:  CT angiography chest from 08/29/2023. FINDINGS: Cardiovascular: Normal cardiac size. No pericardial effusion. No aortic aneurysm. There are coronary artery calcifications, in keeping with coronary artery disease. There are also mild-to-moderate peripheral atherosclerotic vascular calcifications of thoracic aorta and its major branches. Mediastinum/Nodes: Visualized thyroid gland appears grossly unremarkable.  No solid / cystic mediastinal masses. The esophagus is nondistended precluding optimal assessment. There are few mildly prominent mediastinal lymph nodes, which do not meet the size criteria for lymphadenopathy and appear decreased in size since the prior study. No axillary lymphadenopathy by size criteria. Evaluation of bilateral hila is limited due to lack on intravenous contrast: however, no large hilar lymphadenopathy identified. Lungs/Pleura: The trachea and right bronchial tree is patent. There is gradual complete occlusion of the left lung lower lobe bronchus, similar to the prior study. There is associated complete collapse of the left lung lower lobe, similar to the prior study. There is associated small-to-moderate chronic left pleural effusion with associated pleural thickening. There is linear area of scarring/atelectasis along the minor fissure, similar to the prior study. There is focal scarring along the left lung upper lobe, laterally, likely from prior surgery. No new lung mass, consolidation or pneumothorax. No suspicious lung nodule. There  is trace right pleural effusion. There are patchy areas of mosaic attenuation of lungs, consistent with heterogeneous air trapping related to small airways disease. Upper Abdomen: Partially seen single calcified gallstone without overt signs of acute cholecystitis in the visualized portion. There is a small sliding hiatal hernia. There is a 8 by 11 mm left adrenal nodule, incompletely characterized on the current examination but unchanged since the prior study and favored to represent an adenoma. Remaining visualized upper abdominal viscera within normal limits. Musculoskeletal: The visualized soft tissues of the chest wall are grossly unremarkable. There is diffuse osteopenia of the visualized osseous structures. No suspicious osseous lesions. There are mild multilevel degenerative changes in the visualized spine. IMPRESSION: 1. Essentially stable exam. 2.  Redemonstration of complete collapse of the left lung lower lobe and associated small-to-moderate chronic left pleural effusion with thickening. 3. Linear area of scarring/atelectasis along the minor fissure and focal scarring along the left lung upper lobe, similar to the prior study. 4. No new lung mass, consolidation, pneumothorax or suspicious lung nodule. 5. Multiple other nonacute observations, as described above. Aortic Atherosclerosis (ICD10-I70.0). Electronically Signed   By: Jules Schick M.D.   On: 01/31/2024 09:06    Assessment/Plan 1. Hemoptysis (Primary) Resolved for right now.  Schedule xopenex bid seems to help with cough.   2. Acute pulmonary embolism, unspecified pulmonary embolism type, unspecified whether acute cor pulmonale present [facility]) Not able to treat due to hemoptysis per hospital records.  DNR No hospitalizations Goals of care are comfort based Roxanol ordered prn in case she has acute distress.   3. Pulmonary hypertension, unspecified (HCC) Continue torsemide at 20 mg daily Recommend having morphine prn if she has sob not resolved with diuretics and oxygen.  - morphine (ROXANOL) 20 MG/ML concentrated solution; Take 0.25 mLs (5 mg total) by mouth every 4 (four) hours as needed for severe pain (pain score 7-10) or shortness of breath. Reports she has tolerated morphine in the past with no s/e Monitor for s/e of itching when administering.  Dispense: 30 mL; Refill: 0  4. Pleural effusion on left Completed increase torsemide dosing now on 20 mg daily Feels sob on exertion symptom is at baseline. Effusion is chronic.     Family/ staff Communication: resident   Labs/tests ordered:  CBC BMP 3/4   Total time :  time greater than 50% of total time spent doing pt counseling and coordination of care

## 2024-02-20 DIAGNOSIS — R042 Hemoptysis: Secondary | ICD-10-CM | POA: Diagnosis not present

## 2024-02-20 DIAGNOSIS — H353211 Exudative age-related macular degeneration, right eye, with active choroidal neovascularization: Secondary | ICD-10-CM | POA: Diagnosis not present

## 2024-02-20 LAB — CBC AND DIFFERENTIAL
HCT: 29 — AB (ref 36–46)
Hemoglobin: 9.4 — AB (ref 12.0–16.0)
Platelets: 189 10*3/uL (ref 150–400)
WBC: 4.6

## 2024-02-20 LAB — CBC: RBC: 3.37 — AB (ref 3.87–5.11)

## 2024-02-22 DIAGNOSIS — R042 Hemoptysis: Secondary | ICD-10-CM | POA: Diagnosis not present

## 2024-02-22 LAB — HEPATIC FUNCTION PANEL
ALT: 10 U/L (ref 7–35)
AST: 15 (ref 13–35)
Alkaline Phosphatase: 59 (ref 25–125)
Bilirubin, Total: 0.3

## 2024-02-22 LAB — BASIC METABOLIC PANEL WITH GFR
BUN: 16 (ref 4–21)
CO2: 38 — AB (ref 13–22)
Chloride: 96 — AB (ref 99–108)
Creatinine: 0.8 (ref 0.5–1.1)
Glucose: 92
Potassium: 4.1 meq/L (ref 3.5–5.1)
Sodium: 139 (ref 137–147)

## 2024-02-22 LAB — COMPREHENSIVE METABOLIC PANEL WITH GFR
Albumin: 3.4 — AB (ref 3.5–5.0)
Calcium: 9.3 (ref 8.7–10.7)
Globulin: 2.2
eGFR: 73

## 2024-03-26 ENCOUNTER — Inpatient Hospital Stay: Payer: Medicare Other | Attending: Hematology and Oncology | Admitting: Hematology and Oncology

## 2024-03-26 VITALS — BP 109/60 | HR 84 | Temp 97.8°F | Resp 14 | Ht 65.0 in | Wt 125.7 lb

## 2024-03-26 DIAGNOSIS — Z170 Estrogen receptor positive status [ER+]: Secondary | ICD-10-CM | POA: Insufficient documentation

## 2024-03-26 DIAGNOSIS — Z79811 Long term (current) use of aromatase inhibitors: Secondary | ICD-10-CM | POA: Diagnosis not present

## 2024-03-26 DIAGNOSIS — I2699 Other pulmonary embolism without acute cor pulmonale: Secondary | ICD-10-CM | POA: Diagnosis not present

## 2024-03-26 DIAGNOSIS — Z9981 Dependence on supplemental oxygen: Secondary | ICD-10-CM | POA: Diagnosis not present

## 2024-03-26 DIAGNOSIS — C50211 Malignant neoplasm of upper-inner quadrant of right female breast: Secondary | ICD-10-CM | POA: Diagnosis not present

## 2024-03-26 NOTE — Progress Notes (Signed)
 Patient Care Team: Mahlon Gammon, MD as PCP - General (Internal Medicine) [place], Peter M, MD as PCP - Cardiology (Cardiology) Serena Croissant, MD as Consulting Physician (Hematology and Oncology)  DIAGNOSIS:  Encounter Diagnosis  Name Primary?   Malignant neoplasm of upper-inner quadrant of right breast in female, estrogen receptor positive (HCC) Yes    SUMMARY OF ONCOLOGIC HISTORY: Oncology History  Malignant neoplasm of upper-inner quadrant of right breast in female, estrogen receptor positive (HCC)  05/08/2013 Initial Biopsy   Right: grade 1-2 invasive mammary carcinoma ER positive PR positive HER-2/neu negative with Ki-67 30% (MRI 2.8 cm)second smaller mass together 3.8cm   05/11/2013 - 11/04/2013 Anti-estrogen oral therapy   Letrozole 2.5 mg Neoadjuvant anti-estrogen therapy   11/06/2013 Surgery   Bilateral Lumpectomies: Left: sclerosing lesion with ALH fibrocystic changes with microcalcifications. Right: Grade 1 ILC 2.7 cm with LCIS    Radiation Therapy   Patient declined   11/19/2013 - 06/09/2015 Anti-estrogen oral therapy   Letrozole 2.5 mg (stopped for arthralgias and myalgias and fatigue accompanied with hair loss)   01/01/2020 Relapse/Recurrence   Screening mammogram detected a right breast mass. Diagnostic mammogram showed a 4.2cm mass at the 11 o'clock position in the right breast. Biopsy showed invasive mammary carcinoma, grade 2, HER-2 + (3+), ER+ 95%, PR+ 90%, Ki67 20%.    02/10/2020 Surgery   Right lumpectomy Magnus Ivan): mammary carcinoma, grade 2, 3.3cm, clear margins.   02/14/2020 -  Anti-estrogen oral therapy   Anastrozole, half tablet daily   03/05/2020 - 09/14/2020 Chemotherapy   Patient is on Treatment Plan : BREAST Trastuzumab Subcutaneous q21d     Non-small cell cancer of right lung (HCC)  10/20/2017 PET scan   6 cm central left lower lobe pulmonary mass is markedly hypermetabolic with SUV max = 26.8 and extends into the left hilum.   The sub solid 2.4  cm pulmonary nodule in the right middle lobe also shows FDG accumulation with SUV max = 1.9. No evidence for hypermetabolic mediastinal or right hilar lymphadenopathy. L2 uptake degenerative.  LLL:T3N0M0 stage IIb clinical stage; RML: T2N0 Stage 1B   10/20/2017 Imaging   MRI brain: No metastatic disease, right inferior parietal convexity meningioma 2.9 x 2.7 x 1.4 cm.  This indents the brain and associated with mild brain edema   11/01/2017 Initial Diagnosis   Transbronchial needle aspiration right middle lobe and left lower lobe brushings: Both are positive for malignant cells consistent with non-small cell lung cancer    11/29/2017 - 01/18/2018 Radiation Therapy   Radiation   12/01/2017 Pathology Results   Foundation 1:TPS score: 5%; MS-Stable, TMG High, AKT2 Amp, RB1, TP 53 (no mutations noted in EGFR, K-ras, Al, BRAF, RET, ERBB2, Ros 1)     CHIEF COMPLIANT:   HISTORY OF PRESENT ILLNESS: Discussed the use of AI scribe software for clinical note transcription with the patient, who gave verbal consent to proceed.  History of Present Illness Miss Gemmill, a 89 year old resident of an assisted living facility, presents for a routine follow-up. She has a history of lung issues, including a blood clot in the lung, and is currently on 24-hour oxygen therapy. She reports that she is able to walk around her apartment without assistance and uses a motor scooter for longer distances. She has access to oxygen and nursing care in her current living situation. She also reports frequent phlegm production and digestive issues when taking her medications. She expresses concern about the number of medications she is taking and their cost.  She is also on Lexapro for depression, which she wishes to continue.     ALLERGIES:  is allergic to other, codeine, hydrocodone, and neosporin [neomycin-bacitracin zn-polymyx].  MEDICATIONS:  Current Outpatient Medications  Medication Sig Dispense Refill   B  Complex-C-Folic Acid (SUPER B COMPLEX/FA/VIT C) TABS Take by mouth.     cetirizine (ZYRTEC) 10 MG tablet Take 10 mg by mouth daily.     Cholecalciferol (VITAMIN D-3) 1000 UNITS CAPS Take 2,000 Units by mouth daily.     escitalopram (LEXAPRO) 10 MG tablet Take 10 mg by mouth daily.     famotidine (PEPCID) 20 MG tablet Take 1 tablet (20 mg total) by mouth daily.     fluticasone (FLONASE) 50 MCG/ACT nasal spray Place 2 sprays into both nostrils daily. 16 g 6   levalbuterol (XOPENEX) 0.63 MG/3ML nebulizer solution Take 3 mLs (0.63 mg total) by nebulization every 6 (six) hours as needed for wheezing or shortness of breath.     levalbuterol (XOPENEX) 0.63 MG/3ML nebulizer solution Take 3 mLs (0.63 mg total) by nebulization 2 (two) times daily.     metoprolol tartrate (LOPRESSOR) 25 MG tablet Take 0.5 tablets (12.5 mg total) by mouth 2 (two) times daily. 60 tablet 0   morphine (ROXANOL) 20 MG/ML concentrated solution Take 0.25 mLs (5 mg total) by mouth every 4 (four) hours as needed for severe pain (pain score 7-10) or shortness of breath. Reports she has tolerated morphine in the past with no s/e Monitor for s/e of itching when administering. 30 mL 0   Multiple Vitamins-Minerals (PRESERVISION AREDS 2) CAPS Take 1 mg by mouth in the morning and at bedtime.     nitroGLYCERIN (NITROSTAT) 0.4 MG SL tablet Place 0.4 mg under the tongue every 5 (five) minutes as needed for chest pain.     OXYGEN Inhale 3 L into the lungs as directed. Every Shift; Shift 1, Shift 2, Shift 3     potassium chloride (KLOR-CON M) 10 MEQ tablet Take 10 mEq by mouth daily.     pravastatin (PRAVACHOL) 40 MG tablet Take 40 mg daily by mouth.      torsemide (DEMADEX) 20 MG tablet Take 1 tablet (20 mg total) by mouth daily. 1 tablet once a morning     No current facility-administered medications for this visit.    PHYSICAL EXAMINATION: ECOG PERFORMANCE STATUS: 1 - Symptomatic but completely ambulatory  There were no vitals filed for  this visit. There were no vitals filed for this visit.  Physical Exam   (exam performed in the presence of a chaperone)  LABORATORY DATA:  I have reviewed the data as listed    Latest Ref Rng & Units 02/07/2024   10:39 AM 01/25/2024   12:00 AM 09/11/2023   12:00 AM  CMP  Glucose 70 - 99 mg/dL 284     BUN 8 - 23 mg/dL 23  19     19       Creatinine 0.44 - 1.00 mg/dL 1.32  0.8     0.7      Sodium 135 - 145 mmol/L 140  141     143      Potassium 3.5 - 5.1 mmol/L 4.1  4.4     3.9      Chloride 98 - 111 mmol/L 98  98     90      CO2 22 - 32 mmol/L 36  30     44      Calcium 8.9 -  10.3 mg/dL 9.5  9.5     9.7      Total Protein 6.5 - 8.1 g/dL 6.5     Total Bilirubin 0.0 - 1.2 mg/dL 0.6     Alkaline Phos 38 - 126 U/L 53  66       AST 15 - 41 U/L 18  16       ALT 0 - 44 U/L 12  11          This result is from an external source.    Lab Results  Component Value Date   WBC 4.6 02/09/2024   HGB 9.8 (L) 02/09/2024   HCT 32.9 (L) 02/09/2024   MCV 93.2 02/09/2024   PLT 160 02/09/2024   NEUTROABS 5.1 02/07/2024    ASSESSMENT & PLAN:  Malignant neoplasm of upper-inner quadrant of right breast in female, estrogen receptor positive (HCC) 12/23/2019: Suspicious 4.2 cm mass in the right breast, no abnormal right axillary lymph nodes 01/03/2020: Right breast biopsy 11 o'clock position: Grade 2 invasive lobular carcinoma with LCIS ER 95%, PR 90%, Ki-67 20%, HER-2 3+ positive   CT CAP 01/17/20: Masslike consolidation following radiation measuring 6.8 x 3.6 cm as compared to 7.2 by 4.2 cm. Pleural thickening approximately 6 mm . No metastatic disease   02/10/2020:Right lumpectomy Magnus Ivan): mammary carcinoma, grade 2, 3.3cm, clear margins. Adjuvant anastrozole with Herceptin subcutaneously started 03/05/2020 (discontinued by her preference on 09/14/20)   ------------------------------------------------------------------------------------------------------------------------------------------ Breast  Cancer Surveillance: 1. Mammogram and ultrasound: 09/05/2022: Probably benign findings retroareolar aspect of right breast favored to represent benign fat necrosis.  No further mammograms given her age 44.  CT chest: Left central lobar pulmonary embolus with right heart strain, left perihilar consolidation/mass with occlusion of left lower lobe bronchi and complete left lower lobe collapse  Hospitalization 02/07/2024-02/09/2024: Hemoptysis, acute PE (anticoagulation not given because of bleeding risk)  We decided not to pursue any additional treatments given her advanced age and to focus on quality of life and palliation  RTC on an as-needed basis ------------------------------------- Assessment and Plan Assessment & Plan Pulmonary Embolism Pulmonary embolism managed with continuous oxygen therapy, no acute distress. - Continue current oxygen therapy.  Goals of Care She prefers to maintain quality of life without aggressive interventions. - Respect her wishes to avoid aggressive interventions and maintain quality of life.  Follow-up Fifth year of follow-up, care provided as needed. - Provide follow-up care on an as-needed basis. - Contact provider if any issues arise.      No orders of the defined types were placed in this encounter.  The patient has a good understanding of the overall plan. she agrees with it. she will call with any problems that may develop before the next visit here. Total time spent: 30 mins including face to face time and time spent for planning, charting and co-ordination of care   Tamsen Meek, MD 03/26/24

## 2024-03-26 NOTE — Assessment & Plan Note (Signed)
 12/23/2019: Suspicious 4.2 cm mass in the right breast, no abnormal right axillary lymph nodes 01/03/2020: Right breast biopsy 11 o'clock position: Grade 2 invasive lobular carcinoma with LCIS ER 95%, PR 90%, Ki-67 20%, HER-2 3+ positive   CT CAP 01/17/20: Masslike consolidation following radiation measuring 6.8 x 3.6 cm as compared to 7.2 by 4.2 cm. Pleural thickening approximately 6 mm . No metastatic disease   02/10/2020:Right lumpectomy Magnus Ivan): mammary carcinoma, grade 2, 3.3cm, clear margins. Adjuvant anastrozole with Herceptin subcutaneously started 03/05/2020 (discontinued by her preference on 09/14/20)   ------------------------------------------------------------------------------------------------------------------------------------------ Breast Cancer Surveillance: 1. Mammogram and ultrasound: 09/05/2022: Probably benign findings retroareolar aspect of right breast favored to represent benign fat necrosis.  No further mammograms given her age 89.  CT chest: Left central lobar pulmonary embolus with right heart strain, left perihilar consolidation/mass with occlusion of left lower lobe bronchi and complete left lower lobe collapse  Hospitalization 02/07/2024-02/09/2024: Hemoptysis, acute PE (anticoagulation not given because of bleeding risk)  We decided not to pursue any additional treatments given her advanced age and to focus on quality of life and palliation  Macular degeneration: Currently undergoing injections   RTC in 1 year for follow up

## 2024-04-01 ENCOUNTER — Telehealth: Payer: Self-pay | Admitting: *Deleted

## 2024-04-01 NOTE — Telephone Encounter (Signed)
 Called patient to inform of CT for 01-22-25- arrival time- 12:15 pm @ [facility], no restrictions to scan, patient to have telephone fu with Ashlyn Bruning on 01-29-25 @ 10 am for results, lvm for a return call

## 2024-04-30 ENCOUNTER — Non-Acute Institutional Stay: Payer: Medicare Other | Admitting: Internal Medicine

## 2024-04-30 ENCOUNTER — Encounter: Payer: Self-pay | Admitting: Internal Medicine

## 2024-04-30 VITALS — BP 110/60 | HR 62 | Temp 98.2°F | Resp 20 | Ht 65.0 in | Wt 128.4 lb

## 2024-04-30 DIAGNOSIS — E782 Mixed hyperlipidemia: Secondary | ICD-10-CM | POA: Diagnosis not present

## 2024-04-30 DIAGNOSIS — R042 Hemoptysis: Secondary | ICD-10-CM

## 2024-04-30 DIAGNOSIS — J90 Pleural effusion, not elsewhere classified: Secondary | ICD-10-CM

## 2024-04-30 DIAGNOSIS — Z7189 Other specified counseling: Secondary | ICD-10-CM | POA: Diagnosis not present

## 2024-04-30 DIAGNOSIS — Z853 Personal history of malignant neoplasm of breast: Secondary | ICD-10-CM | POA: Diagnosis not present

## 2024-04-30 DIAGNOSIS — I2699 Other pulmonary embolism without acute cor pulmonale: Secondary | ICD-10-CM

## 2024-04-30 DIAGNOSIS — H6121 Impacted cerumen, right ear: Secondary | ICD-10-CM

## 2024-04-30 DIAGNOSIS — R000 Tachycardia, unspecified: Secondary | ICD-10-CM

## 2024-04-30 DIAGNOSIS — I272 Pulmonary hypertension, unspecified: Secondary | ICD-10-CM

## 2024-04-30 DIAGNOSIS — F411 Generalized anxiety disorder: Secondary | ICD-10-CM | POA: Diagnosis not present

## 2024-04-30 DIAGNOSIS — J9611 Chronic respiratory failure with hypoxia: Secondary | ICD-10-CM | POA: Diagnosis not present

## 2024-04-30 DIAGNOSIS — H00011 Hordeolum externum right upper eyelid: Secondary | ICD-10-CM

## 2024-04-30 NOTE — Progress Notes (Unsigned)
 Location:  [facility]    Place of Service:   Clinic  Provider:   Code Status: DNR Goals of Care:     02/12/2024   10:03 AM  Advanced Directives  Does Patient Have a Medical Advance Directive? Yes  Type of Estate agent of [place];Living will;Out of facility DNR (pink MOST or yellow form)  Does patient want to make changes to medical advance directive? No - Patient declined  Copy of Healthcare Power of Attorney in Chart? Yes - validated most recent copy scanned in chart (See row information)     Chief Complaint  Patient presents with   Follow-up    Patient has concerns about medication.    HPI: Patient is a 89 y.o. female seen today for medical management of chronic diseases.    Patient is now in [place] in [place]    She is admitted to the hospital from 02/19-02/21 for Hemoptysis  Possible due to Prior  Radiation    Patient has a history of breast cancer s/p bilateral lumpectomy in 2014 with recurrence in 2021 in remission  history of NSCLC S/P Radiation not candidate for chemo or surgery History of chronic left pleural effusion that has been present for more than 4 years Diastolic CHF Echo Shows Severe TR and Elevated Pulmonary HTN and Normal EF CT scan Chronic Occlusive Thrombus in Left LL of Pulmonary Artery Consolidation of Left LL with Effusion CT scan of Chest on 09/10 New Right sided Effusion Underwent Thoracocentesis on 09/11 Fluid is Transudate with no suspicious cells   COPD on Chronic Oxygen   Acute issues COPD Continuous to have cough with mucus Uses Oxygen  gets tired easily  Does want me to Review her Meds to discontinue the one which she does not need or can prolong her Life Has not had Any Hemoptysis recently Able to walk with her walker Right Eye Stye Is doing warm Compresses  Cognitively doing well Walks with her walker Uses Power chair also    Past Medical History:  Diagnosis Date   Allergic rhinitis    Anemia    during  pregnancy   Arthritis    Breast cancer (HCC) 05/08/13   right upper inner, invasive mammary   Breast cancer, right breast (HCC) 04/16/2013   Underwent lumpectomy on 11/06/13. 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   Use of letrozole  (Femara )    neoadjuvant antiestrogen therapy with letrozole  2.5 mg daily x 7 monhts    Past Surgical History:  Procedure Laterality Date   BREAST BIOPSY Left 1960   lt br bx/benign   BREAST LUMPECTOMY Right    BREAST LUMPECTOMY Right 02/10/2020   Procedure: RIGHT BREAST LUMPECTOMY;  Surgeon: Oza Blumenthal, MD;  Location: [facility] OR;  Service: General;  Laterality: Right;   BREAST LUMPECTOMY WITH NEEDLE LOCALIZATION Bilateral 11/06/2013   Procedure: BREAST LUMPECTOMY WITH NEEDLE LOCALIZATION;  Surgeon: Darcella Earnest,  MD;  Location: [facility];  Service: General;  Laterality: Bilateral;   COLONOSCOPY     EYE SURGERY Bilateral    both cataracts   MOHS SURGERY Right    nose basal/squamous   THORACENTESIS N/A 08/30/2023   Procedure: THORACENTESIS;  Surgeon: Quillian Brunt, MD;  Location: [facility] ENDOSCOPY;  Service: Pulmonary;  Laterality: N/A;   TOTAL HIP ARTHROPLASTY Left 08/31/2018   Procedure: LEFT TOTAL HIP ARTHROPLASTY ANTERIOR APPROACH;  Surgeon: Arnie Lao, MD;  Location: [facility];  Service: Orthopedics;  Laterality: Left;   VIDEO BRONCHOSCOPY WITH ENDOBRONCHIAL NAVIGATION N/A 11/01/2017   Procedure: VIDEO BRONCHOSCOPY WITH ENDOBRONCHIAL NAVIGATION;  Surgeon: Zelphia Higashi, MD;  Location: [facility] OR;   Service: Thoracic;  Laterality: N/A;   WRIST SURGERY  1990   lt    Allergies  Allergen Reactions   Other Anaphylaxis    Lobster- "years ago"   Codeine Itching    Insomnia    Hydrocodone  Itching and Other (See Comments)    Insomnia   Neosporin [Neomycin-Bacitracin Zn-Polymyx] Rash    Outpatient Encounter Medications as of 04/30/2024  Medication Sig   B Complex-C-Folic Acid (SUPER B COMPLEX/FA/VIT C) TABS Take by mouth.   carboxymethylcellulose (ARTIFICIAL TEARS) 1 % ophthalmic solution 1 drop every 2 (two) hours as needed. ` gtt right eye Q1- 2hrs, ophthalmic (eye), every 2 hours - PRN, Patient may keep at bed side and may self administer/   cetirizine (ZYRTEC) 10 MG tablet Take 10 mg by mouth daily.   Cholecalciferol  (VITAMIN D -3) 1000 UNITS CAPS Take 2,000 Units by mouth daily.   escitalopram  (LEXAPRO ) 10 MG tablet Take 10 mg by mouth daily.   famotidine  (PEPCID ) 20 MG tablet Take 1 tablet (20 mg total) by mouth daily.   fluticasone  (FLONASE ) 50 MCG/ACT nasal spray Place 2 sprays into both nostrils daily.   levalbuterol  (XOPENEX ) 0.63 MG/3ML nebulizer solution Take 3 mLs (0.63 mg total) by nebulization every 6 (six) hours as needed for wheezing or shortness of breath.   levalbuterol  (XOPENEX ) 0.63 MG/3ML nebulizer solution Take 3 mLs (0.63 mg total) by nebulization 2 (two) times daily.   metoprolol  tartrate (LOPRESSOR ) 25 MG tablet Take 0.5 tablets (12.5 mg total) by mouth 2 (two) times daily.   morphine  (ROXANOL) 20 MG/ML concentrated solution Take 0.25 mLs (5 mg total) by mouth every 4 (four) hours as needed for severe pain (pain score 7-10) or shortness of breath. Reports she has tolerated morphine  in the past with no s/e Monitor for s/e of itching when administering.   Multiple Vitamins-Minerals (PRESERVISION AREDS 2) CAPS Take 1 mg by mouth in the morning and at bedtime.   nitroGLYCERIN  (NITROSTAT ) 0.4 MG SL tablet Place 0.4 mg under the tongue every 5 (five) minutes as needed for  chest pain.   OXYGEN  Inhale 3 L into the lungs as directed. Every Shift; Shift 1, Shift 2, Shift 3   potassium chloride  (KLOR-CON  M) 10 MEQ tablet Take 10 mEq by mouth daily.   pravastatin  (PRAVACHOL ) 40 MG tablet Take 40 mg daily by mouth.    torsemide  (DEMADEX ) 20 MG tablet Take 1 tablet (20 mg total) by mouth daily. 1 tablet once a morning   No facility-administered encounter medications on file as of 04/30/2024.    Review of Systems:  Review of Systems  Constitutional:  Negative for activity change and appetite change.  HENT:  Positive for postnasal drip and rhinorrhea.   Respiratory:  Positive for cough and shortness of breath.  Cardiovascular:  Negative for leg swelling.  Gastrointestinal:  Negative for constipation.  Genitourinary: Negative.   Musculoskeletal:  Positive for gait problem. Negative for arthralgias and myalgias.  Skin: Negative.   Neurological:  Positive for weakness. Negative for dizziness.  Psychiatric/Behavioral:  Negative for confusion, dysphoric mood and sleep disturbance.     Health Maintenance  Topic Date Due   Zoster Vaccines- Shingrix (1 of 2) 11/23/1945   Medicare Annual Wellness (AWV)  05/24/2024   COVID-19 Vaccine (5 - Moderna risk 2024-25 season) 12/19/2024 (Originally 05/01/2024)   INFLUENZA VACCINE  07/19/2024   DTaP/Tdap/Td (3 - Tdap) 04/11/2028   Pneumonia Vaccine 44+ Years old  Completed   DEXA SCAN  Completed   HPV VACCINES  Aged Out   Meningococcal B Vaccine  Aged Out   Colonoscopy  Discontinued    Physical Exam: Vitals:   04/30/24 1446  BP: 110/60  Pulse: 62  Resp: 20  Temp: 98.2 F (36.8 C)  SpO2: 99%  Weight: 128 lb 6.4 oz (58.2 kg)  Height: 5\' 5"  (1.651 m)   Body mass index is 21.37 kg/m. Physical Exam Vitals reviewed.  Constitutional:      Appearance: Normal appearance.  HENT:     Head: Normocephalic.     Ears:     Comments: Was in Right ear    Nose: Nose normal.     Mouth/Throat:     Mouth: Mucous membranes are  moist.     Pharynx: Oropharynx is clear.  Eyes:     Pupils: Pupils are equal, round, and reactive to light.     Comments: Stye in Right Eye Upper Lid  Cardiovascular:     Rate and Rhythm: Normal rate and regular rhythm.     Pulses: Normal pulses.     Heart sounds: Normal heart sounds. No murmur heard. Pulmonary:     Effort: Pulmonary effort is normal.     Breath sounds: Rales present.  Abdominal:     General: Abdomen is flat. Bowel sounds are normal.     Palpations: Abdomen is soft.  Musculoskeletal:        General: No swelling.     Cervical back: Neck supple.  Skin:    General: Skin is warm.  Neurological:     General: No focal deficit present.     Mental Status: She is alert and oriented to person, place, and time.  Psychiatric:        Mood and Affect: Mood normal.        Thought Content: Thought content normal.     Labs reviewed: Basic Metabolic Panel: Recent Labs    08/11/23 1307 08/25/23 0000 08/31/23 0000 09/11/23 0000 01/25/24 0000 02/07/24 1039  NA 143 139   < > 143 141 140  K 4.1 5.4*   < > 3.9 4.4 4.1  CL 94* 94*   < > 90* 98* 98  CO2 37* 31*   < > 44* 30* 36*  GLUCOSE 99  --   --   --   --  100*  BUN 17 17   < > 19 19 23   CREATININE 0.70 0.7   < > 0.7 0.8 0.75  CALCIUM 9.6 9.7   < > 9.7 9.5 9.5  TSH  --  2.40  --   --   --   --    < > = values in this interval not displayed.   Liver Function Tests: Recent Labs    08/25/23 0000 08/30/23 1611 01/25/24 0000  02/07/24 1039  AST 32  --  16 18  ALT 14  --  11 12  ALKPHOS 61  --  66 53  BILITOT  --   --   --  0.6  PROT  --  6.6  --  6.5  ALBUMIN 3.7  --  3.5 3.3*   No results for input(s): "LIPASE", "AMYLASE" in the last 8760 hours. No results for input(s): "AMMONIA" in the last 8760 hours. CBC: Recent Labs    02/07/24 1039 02/08/24 0553 02/08/24 1648 02/09/24 0837 02/20/24 0000  WBC 6.8 5.4 5.8 4.6 4.6  NEUTROABS 5.1  --   --   --   --   HGB 10.6* 9.8* 10.1* 9.8* 9.4*  HCT 34.1* 32.6*  33.1* 32.9* 29*  MCV 90.0 92.9 91.4 93.2  --   PLT 164 145* 165 160 189   Lipid Panel: Recent Labs    01/25/24 0000  CHOL 148  HDL 65  LDLCALC 70  TRIG 65   Lab Results  Component Value Date   HGBA1C 5.6 12/25/2017    Procedures since last visit: No results found.  Assessment/Plan 1. Severe pulmonary hypertension (HCC) Followed by Cardiology Poor Candidate for Valvular Intervention Per Pulmonary PAH due to chronic Lung disease Her overall conditions are not reversible and best management would be oxygen  support as needed and medical management with diuresis with Cardiology.  Patient is aware On Demadex  2. Generalized anxiety disorder On Lexapro  which helps  3. Mixed hyperlipidemia Discontinue Statin due to goals of care  4. Hx of breast cancer Follows with Oncology PRN follow up now  5. Hemoptysis Has not had any recent Episodes Will discontinue Xopenox for now Not using it  6. Sinus tachycardia Low dos eof Metoprolol   7. ACP (advance care planning) Wants to be comfort care [facility]  8. Chronic respiratory failure with hypoxia (HCC) (Primary) Uses Chronic Oxygen  9 Right [place] Stye Erythromycin QID for 2 weeks Warm Compresses 10 Ear wax Ordered ear wash  Labs/tests ordered:  CBC,CMP Next appt:  06/03/2024

## 2024-05-21 DIAGNOSIS — Z961 Presence of intraocular lens: Secondary | ICD-10-CM | POA: Diagnosis not present

## 2024-05-21 DIAGNOSIS — H353211 Exudative age-related macular degeneration, right eye, with active choroidal neovascularization: Secondary | ICD-10-CM | POA: Diagnosis not present

## 2024-05-21 DIAGNOSIS — H43813 Vitreous degeneration, bilateral: Secondary | ICD-10-CM | POA: Diagnosis not present

## 2024-05-21 DIAGNOSIS — H35033 Hypertensive retinopathy, bilateral: Secondary | ICD-10-CM | POA: Diagnosis not present

## 2024-05-21 DIAGNOSIS — H353122 Nonexudative age-related macular degeneration, left eye, intermediate dry stage: Secondary | ICD-10-CM | POA: Diagnosis not present

## 2024-06-03 ENCOUNTER — Non-Acute Institutional Stay: Admitting: Adult Health

## 2024-06-03 ENCOUNTER — Encounter: Payer: Self-pay | Admitting: Adult Health

## 2024-06-03 VITALS — BP 110/60 | HR 107 | Temp 98.0°F | Ht 65.0 in | Wt 127.2 lb

## 2024-06-03 DIAGNOSIS — Z0000 Encounter for general adult medical examination without abnormal findings: Secondary | ICD-10-CM

## 2024-06-03 NOTE — Patient Instructions (Signed)
 [name] French , Thank you for taking time to come for your Medicare Wellness Visit. I appreciate your ongoing commitment to your health goals. Please review the following plan we discussed and let me know if I can assist you in the future.   Screening recommendations/referrals: Colonoscopy aged out Mammogram aged out Bone Density declined  Recommended yearly ophthalmology/optometry visit for glaucoma screening and checkup Recommended yearly dental visit for hygiene and checkup  Vaccinations: Influenza vaccine- due annually in September/October Pneumococcal vaccine declined Tdap vaccine up to date  Shingles vaccine up to date    Advanced directives: reviewed  Conditions/risks identified: Cardiac and fall risk   Next appointment: 1 year    Preventive Care 65 Years and Older, Female Preventive care refers to lifestyle choices and visits with your health care provider that can promote health and wellness. What does preventive care include? A yearly physical exam. This is also called an annual well check. Dental exams once or twice a year. Routine eye exams. Ask your health care provider how often you should have your eyes checked. Personal lifestyle choices, including: Daily care of your teeth and gums. Regular physical activity. Eating a healthy diet. Avoiding tobacco and drug use. Limiting alcohol  use. Practicing safe sex. Taking low-dose aspirin  every day. Taking vitamin and mineral supplements as recommended by your health care provider. What happens during an annual well check? The services and screenings done by your health care provider during your annual well check will depend on your age, overall health, lifestyle risk factors, and family history of disease. Counseling  Your health care provider may ask you questions about your: Alcohol  use. Tobacco use. Drug use. Emotional well-being. Home and relationship well-being. Sexual activity. Eating habits. History of  falls. Memory and ability to understand (cognition). Work and work Astronomer. Reproductive health. Screening  You may have the following tests or measurements: Height, weight, and BMI. Blood pressure. Lipid and cholesterol levels. These may be checked every 5 years, or more frequently if you are over 39 years old. Skin check. Lung cancer screening. You may have this screening every year starting at age 85 if you have a 30-pack-year history of smoking and currently smoke or have quit within the past 15 years. Fecal occult blood test (FOBT) of the stool. You may have this test every year starting at age 78. Flexible sigmoidoscopy or colonoscopy. You may have a sigmoidoscopy every 5 years or a colonoscopy every 10 years starting at age 3. Hepatitis C blood test. Hepatitis B blood test. Sexually transmitted disease (STD) testing. Diabetes screening. This is done by checking your blood sugar (glucose) after you have not eaten for a while (fasting). You may have this done every 1-3 years. Bone density scan. This is done to screen for osteoporosis. You may have this done starting at age 35. Mammogram. This may be done every 1-2 years. Talk to your health care provider about how often you should have regular mammograms. Talk with your health care provider about your test results, treatment options, and if necessary, the need for more tests. Vaccines  Your health care provider may recommend certain vaccines, such as: Influenza vaccine. This is recommended every year. Tetanus, diphtheria, and acellular pertussis (Tdap, Td) vaccine. You may need a Td booster every 10 years. Zoster vaccine. You may need this after age 92. Pneumococcal 13-valent conjugate (PCV13) vaccine. One dose is recommended after age 86. Pneumococcal polysaccharide (PPSV23) vaccine. One dose is recommended after age 16. Talk to your health care provider  about which screenings and vaccines you need and how often you need  them. This information is not intended to replace advice given to you by your health care provider. Make sure you discuss any questions you have with your health care provider. Document Released: 01/01/2016 Document Revised: 08/24/2016 Document Reviewed: 10/06/2015 Elsevier Interactive Patient Education  2017 [employer].  Fall Prevention in the Home Falls can cause injuries. They can happen to people of all ages. There are many things you can do to make your home safe and to help prevent falls. What can I do on the outside of my home? Regularly fix the edges of walkways and driveways and fix any cracks. Remove anything that might make you trip as you walk through a door, such as a raised step or threshold. Trim any bushes or trees on the path to your home. Use bright outdoor lighting. Clear any walking paths of anything that might make someone trip, such as rocks or tools. Regularly check to see if handrails are loose or broken. Make sure that both sides of any steps have handrails. Any raised decks and porches should have guardrails on the edges. Have any leaves, snow, or ice cleared regularly. Use sand or salt on walking paths during winter. Clean up any spills in your garage right away. This includes oil or grease spills. What can I do in the bathroom? Use night lights. Install grab bars by the toilet and in the tub and shower. Do not use towel bars as grab bars. Use non-skid mats or decals in the tub or shower. If you need to sit down in the shower, use a plastic, non-slip stool. Keep the floor dry. Clean up any water  that spills on the floor as soon as it happens. Remove soap buildup in the tub or shower regularly. Attach bath mats securely with double-sided non-slip rug tape. Do not have throw rugs and other things on the floor that can make you trip. What can I do in the bedroom? Use night lights. Make sure that you have a light by your bed that is easy to reach. Do not use  any sheets or blankets that are too big for your bed. They should not hang down onto the floor. Have a firm chair that has side arms. You can use this for support while you get dressed. Do not have throw rugs and other things on the floor that can make you trip. What can I do in the kitchen? Clean up any spills right away. Avoid walking on wet floors. Keep items that you use a lot in easy-to-reach places. If you need to reach something above you, use a strong step stool that has a grab bar. Keep electrical cords out of the way. Do not use floor polish or wax that makes floors slippery. If you must use wax, use non-skid floor wax. Do not have throw rugs and other things on the floor that can make you trip. What can I do with my stairs? Do not leave any items on the stairs. Make sure that there are handrails on both sides of the stairs and use them. Fix handrails that are broken or loose. Make sure that handrails are as long as the stairways. Check any carpeting to make sure that it is firmly attached to the stairs. Fix any carpet that is loose or worn. Avoid having throw rugs at the top or bottom of the stairs. If you do have throw rugs, attach them to the floor  with carpet tape. Make sure that you have a light switch at the top of the stairs and the bottom of the stairs. If you do not have them, ask someone to add them for you. What else can I do to help prevent falls? Wear shoes that: Do not have high heels. Have rubber bottoms. Are comfortable and fit you well. Are closed at the toe. Do not wear sandals. If you use a stepladder: Make sure that it is fully opened. Do not climb a closed stepladder. Make sure that both sides of the stepladder are locked into place. Ask someone to hold it for you, if possible. Clearly mark and make sure that you can see: Any grab bars or handrails. First and last steps. Where the edge of each step is. Use tools that help you move around (mobility aids)  if they are needed. These include: Canes. Walkers. Scooters. Crutches. Turn on the lights when you go into a dark area. Replace any light bulbs as soon as they burn out. Set up your furniture so you have a clear path. Avoid moving your furniture around. If any of your floors are uneven, fix them. If there are any pets around you, be aware of where they are. Review your medicines with your doctor. Some medicines can make you feel dizzy. This can increase your chance of falling. Ask your doctor what other things that you can do to help prevent falls. This information is not intended to replace advice given to you by your health care provider. Make sure you discuss any questions you have with your health care provider. Document Released: 10/01/2009 Document Revised: 05/12/2016 Document Reviewed: 01/09/2015 Elsevier Interactive Patient Education  2017 [employer].

## 2024-06-03 NOTE — Progress Notes (Signed)
 Subjective:   [name] French is a 89 y.o. female who presents for Medicare Annual (Subsequent) preventive examination at [facility] setting   Visit Complete: In person  Patient Medicare AWV questionnaire was completed by the patient on 06/03/24; I have confirmed that all information answered by patient is correct and no changes since this date.  Cardiac Risk Factors include: advanced age (>38men, >26 women);hypertension;sedentary lifestyle     Objective:    Today's Vitals   06/03/24 1333  BP: 110/60  Pulse: (!) 107  Temp: 98 F (36.7 C)  SpO2: 99%  Weight: 127 lb 3.2 oz (57.7 kg)  Height: 5' 5 (1.651 m)   Body mass index is 21.17 kg/m.     06/03/2024    1:27 PM 02/12/2024   10:03 AM 02/07/2024    4:51 PM 02/07/2024   10:09 AM 01/31/2024    9:30 AM 01/29/2024    2:45 PM 10/31/2023   10:54 AM  Advanced Directives  Does Patient Have a Medical Advance Directive? Yes Yes Yes Yes Yes Yes Yes  Type of Estate agent of [place];Living will;Out of facility DNR (pink MOST or yellow form) Healthcare Power of [place];Living will;Out of facility DNR (pink MOST or yellow form) Healthcare Power of [place];Living will;Out of facility DNR (pink MOST or yellow form) Healthcare Power of [place];Living will;Out of facility DNR (pink MOST or yellow form)  Healthcare Power of [place];Living will;Out of facility DNR (pink MOST or yellow form) Out of facility DNR (pink MOST or yellow form)  Does patient want to make changes to medical advance directive? No - Patient declined No - Patient declined No - Patient declined   No - Patient declined No - Patient declined  Copy of Healthcare Power of Attorney in Chart? Yes - validated most recent copy scanned in chart (See row information) Yes - validated most recent copy scanned in chart (See row information) Yes - validated most recent copy scanned in chart (See row information)   Yes - validated most recent  copy scanned in chart (See row information) Yes - validated most recent copy scanned in chart (See row information)  Pre-existing out of facility DNR order (yellow form or pink MOST form)       Yellow form placed in chart (order not valid for inpatient use)    Current Medications (verified) Outpatient Encounter Medications as of 06/03/2024  Medication Sig   carboxymethylcellulose (ARTIFICIAL TEARS) 1 % ophthalmic solution 1 drop every 2 (two) hours as needed. ` gtt right eye Q1- 2hrs, ophthalmic (eye), every 2 hours - PRN, Patient may keep at bed side and may self administer/   cetirizine (ZYRTEC) 10 MG tablet Take 10 mg by mouth daily.   Cholecalciferol  (VITAMIN D -3) 1000 UNITS CAPS Take 2,000 Units by mouth daily.   escitalopram  (LEXAPRO ) 10 MG tablet Take 10 mg by mouth daily.   famotidine  (PEPCID ) 20 MG tablet Take 1 tablet (20 mg total) by mouth daily.   fluticasone  (FLONASE ) 50 MCG/ACT nasal spray Place 2 sprays into both nostrils daily.   metoprolol  tartrate (LOPRESSOR ) 25 MG tablet Take 0.5 tablets (12.5 mg total) by mouth 2 (two) times daily.   Multiple Vitamins-Minerals (PRESERVISION AREDS 2) CAPS Take 1 mg by mouth in the morning and at bedtime.   nitroGLYCERIN  (NITROSTAT ) 0.4 MG SL tablet Place 0.4 mg under the tongue every 5 (five) minutes as needed for chest pain.   potassium chloride  (KLOR-CON  M) 10 MEQ tablet Take 10 mEq  by mouth daily.   torsemide  (DEMADEX ) 20 MG tablet Take 1 tablet (20 mg total) by mouth daily. 1 tablet once a morning   OXYGEN  Inhale 3 L into the lungs as directed. Every Shift; Shift 1, Shift 2, Shift 3   No facility-administered encounter medications on file as of 06/03/2024.    Allergies (verified) Other, Codeine, Hydrocodone , and Neosporin [neomycin-bacitracin zn-polymyx]   History: Past Medical History:  Diagnosis Date   Allergic rhinitis    Anemia    during pregnancy   Arthritis    Breast cancer (HCC) 05/08/13   right upper inner, invasive  mammary   Breast cancer, right breast (HCC) 04/16/2013   Underwent lumpectomy on 11/06/13. 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   Use of letrozole  (Femara )    neoadjuvant antiestrogen therapy with letrozole  2.5 mg daily x 7 monhts   Past Surgical History:  Procedure Laterality Date   BREAST BIOPSY Left 1960   lt br bx/benign   BREAST LUMPECTOMY Right    BREAST LUMPECTOMY Right 02/10/2020   Procedure: RIGHT BREAST LUMPECTOMY;  Surgeon: Oza Blumenthal, MD;  Location: [facility] OR;  Service: General;  Laterality: Right;   BREAST LUMPECTOMY WITH NEEDLE LOCALIZATION Bilateral 11/06/2013   Procedure: BREAST LUMPECTOMY WITH NEEDLE LOCALIZATION;  Surgeon: Darcella Earnest, MD;  Location: [facility];  Service: General;  Laterality: Bilateral;   COLONOSCOPY     EYE SURGERY Bilateral    both cataracts   MOHS SURGERY Right    nose basal/squamous   THORACENTESIS N/A 08/30/2023   Procedure: THORACENTESIS;  Surgeon: Quillian Brunt, MD;  Location: [facility] END[facility]COPY;  Service: Pulmonary;  Laterality: N/A;   TOTAL HIP ARTHROPLASTY Left 08/31/2018   Procedure: LEFT TOTAL HIP ARTHROPLASTY ANTERIOR APPROACH;  Surgeon: Arnie Lao, MD;  Location: [facility] ORS;  Service: Orthopedics;  Laterality: Left;   VIDEO BRONCHOSCOPY WITH ENDOBRONCHIAL NAVIGATION N/A 11/01/2017   Procedure: VIDEO BRONCHOSCOPY WITH ENDOBRONCHIAL NAVIGATION;  Surgeon: Zelphia Higashi, MD;  Location: [facility] OR;  Service: Thoracic;  Laterality: N/A;   WRIST SURGERY  1990   lt   Family History   Problem Relation Age of Onset   Heart disease Mother    Prostate cancer Father    Lung cancer Sister    Social History   Socioeconomic History   Marital status: Divorced    Spouse name: Not on file   Number of children: 2   Years of education: Not on file   Highest education level: Not on file  Occupational History   Occupation: Retired  Tobacco Use   Smoking status: Former    Current packs/day: 0.00    Average packs/day: 0.5 packs/day for 25.0 years (12.5 ttl pk-yrs)    Types: Cigarettes    Start date: 12/19/1953    Quit date: 12/19/1978    Years since quitting: 45.4   Smokeless tobacco: Never  Vaping Use  Vaping status: Never Used  Substance and Sexual Activity   Alcohol  use: Yes    Comment: wine usually, no taste in the last in past few weeks- (02/07/2020)   Drug use: No   Sexual activity: Not Currently    Comment: menarche at age14, menopause in 30, HRT x 43, age at first live birth 56's, G28 P3  Other Topics Concern   Not on file  Social History Narrative   Not on file   Social Drivers of Health   Financial Resource Strain: Not on file  Food Insecurity: No Food Insecurity (02/07/2024)   Hunger Vital Sign    Worried About Running Out of Food in the Last Year: Never true    Ran Out of Food in the Last Year: Never true  Transportation Needs: No Transportation Needs (02/07/2024)   PRAPARE - Administrator, Civil Service (Medical): No    Lack of Transportation (Non-Medical): No  Physical Activity: Not on file  Stress: Not on file  Social Connections: Moderately Integrated (02/07/2024)   Social Connection and Isolation Panel    Frequency of Communication with Friends and Family: Twice a week    Frequency of Social Gatherings with Friends and Family: Twice a week    Attends Religious Services: 1 to 4 times per year    Active Member of [employer] or Organizations: No    Attends Engineer, structural: 1 to 4 times per year    Marital Status: Divorced     Tobacco Counseling Counseling given: Not Answered   Clinical Intake:  Pre-visit preparation completed: No  Pain : No/denies pain     BMI - recorded: 21.17 Nutritional Status: BMI of 19-24  Normal Nutritional Risks: None Diabetes: No  How often do you need to have someone help you when you read instructions, pamphlets, or other written materials from your doctor or pharmacy?: 1 - Never What is the last grade level you completed in school?: [employer]  Interpreter Needed?: No  Information entered by :: Raylene Calamity NP   Activities of Daily Living    06/03/2024    2:04 PM 02/07/2024    4:51 PM  In your present state of health, do you have any difficulty performing the following activities:  Hearing? 1 0  Vision? 1 0  Difficulty concentrating or making decisions? 0 0  Walking or climbing stairs? 0   Dressing or bathing? 0   Doing errands, shopping? 0   Preparing Food and eating ? N   Using the Toilet? N   In the past six months, have you accidently leaked urine? N   Do you have problems with loss of bowel control? N   Managing your Medications? N   Managing your Finances? N   Housekeeping or managing your Housekeeping? N     Patient Care Team: Marguerite Shiley, MD as PCP - General (Internal Medicine) [place], Peter M, MD as PCP - Cardiology (Cardiology) Cameron Cea, MD as Consulting Physician (Hematology and Oncology)  Indicate any recent Medical Services you may have received from other than [facility] providers in the past year (date may be approximate).     Assessment:   This is a routine wellness examination for [place].  Hearing/Vision screen Hearing Screening - Comments:: Patient planning to look into hearing aids.  Vision Screening - Comments:: Had appt. With eye specialist June 3rd.   Goals Addressed             This Visit's Progress  Patient Stated       Die peacefully at night        Depression Screen    06/03/2024    1:29 PM  01/29/2024    2:45 PM 10/11/2023    7:16 PM 02/20/2018    9:19 AM 11/15/2017    2:18 PM  PHQ 2/9 Scores  PHQ - 2 Score 0 0 0 0 0    Fall Risk    06/03/2024    1:29 PM 01/29/2024    2:44 PM 10/11/2023    7:16 PM 08/25/2023    6:56 AM 02/20/2018    9:19 AM  Fall Risk   Falls in the past year? 0 0 0 0 No   Number falls in past yr: 0 0 0 0   Injury with Fall? 0 0 0 0   Risk for fall due to : No Fall Risks Impaired balance/gait;Impaired mobility Impaired mobility History of fall(s)   Follow up Falls evaluation completed Falls evaluation completed Falls evaluation completed;Education provided;Falls prevention discussed Falls evaluation completed      Data saved with a previous flowsheet row definition    MEDICARE RISK AT HOME: Medicare Risk at Home Any stairs in or around the home?: No If so, are there any without handrails?: No Home free of loose throw rugs in walkways, pet beds, electrical cords, etc?: Yes Adequate lighting in your home to reduce risk of falls?: Yes Life alert?: No Use of a cane, walker or w/c?: Yes Grab bars in the bathroom?: Yes Shower chair or bench in shower?: Yes Elevated toilet seat or a handicapped toilet?: Yes  TIMED UP AND GO:  Was the test performed?  No    Cognitive Function:        06/03/2024    1:29 PM  6CIT Screen  What Year? 0 points  What month? 0 points  What time? 0 points  Count back from 20 0 points  Months in reverse 0 points  Repeat phrase 0 points  Total Score 0 points    Immunizations Immunization History  Administered Date(s) Administered   Fluad Quad(high Dose 65+) 09/14/2020   Fluad Trivalent(High Dose 65+) 11/01/2023   Influenza, High Dose Seasonal PF 10/06/2017   Influenza-Unspecified 09/18/2012, 09/27/2013, 09/24/2014, 10/01/2016   Moderna Covid-19 Fall Seasonal Vaccine 20yrs & older 11/02/2023   Moderna Covid-19 Vaccine  Bivalent Booster 46yrs & up 04/13/2023   Moderna SARS-COV2 Booster Vaccination 05/21/2021    Moderna Sars-Covid-2 Vaccination 12/28/2019, 01/29/2020   Pneumococcal Conjugate-13 03/18/2014   Pneumococcal Polysaccharide-23 12/04/2008, 02/19/2013   Td 12/04/2008, 04/11/2018   Zoster, Live 12/04/2008   Zoster, Unspecified 06/16/2021    TDAP status: Up to date  Flu Vaccine status: Up to date  Pneumococcal vaccine status: Declined,  Education has been provided regarding the importance of this vaccine but patient still declined. Advised may receive this vaccine at local pharmacy or Health Dept. Aware to provide a copy of the vaccination record if obtained from local pharmacy or Health Dept. Verbalized acceptance and understanding.   Covid-19 vaccine status: Completed vaccines  Qualifies for Shingles Vaccine? Yes   Zostavax completed Yes   Shingrix Completed?: Yes  Screening Tests Health Maintenance  Topic Date Due   Zoster Vaccines- Shingrix (1 of 2) 11/23/1945   COVID-19 Vaccine (5 - Moderna risk 2024-25 season) 12/19/2024 (Originally 05/01/2024)   INFLUENZA VACCINE  07/19/2024   Medicare Annual Wellness (AWV)  06/03/2025   DTaP/Tdap/Td (3 - Tdap) 04/11/2028   Pneumococcal Vaccine: 50+ Years  Completed  DEXA SCAN  Completed   HPV VACCINES  Aged Out   Meningococcal B Vaccine  Aged Out   Colonoscopy  Discontinued    Health Maintenance  Health Maintenance Due  Topic Date Due   Zoster Vaccines- Shingrix (1 of 2) 11/23/1945    Colorectal cancer screening: No longer required.   Mammogram status: No longer required due to age.  Bone Density status: Completed NA. Results reflect: Bone density results: OSTEOPOROSIS. Repeat every declined due to age and health status.  years.  Lung Cancer Screening: (Low Dose CT Chest recommended if Age 50-80 years, 20 pack-year currently smoking OR have quit w/in 15years.) does not qualify.   Lung Cancer Screening Referral: NA  Additional Screening:  Hepatitis C Screening: does not qualify; Completed NA  Vision Screening: Recommended  annual ophthalmology exams for early detection of glaucoma and other disorders of the eye. Is the patient up to date with their annual eye exam?  Yes  Who is the provider or what is the name of the office in which the patient attends annual eye exams? [facility] If pt is not established with a provider, would they like to be referred to a provider to establish care? No .   Dental Screening: Recommended annual dental exams for proper oral hygiene  Diabetic Foot Exam: NA  Community Resource Referral / Chronic Care Management: CRR required this visit?  No   CCM required this visit?  No     Plan:     I have personally reviewed and noted the following in the patient's chart:   Medical and social history Use of alcohol , tobacco or illicit drugs  Current medications and supplements including opioid prescriptions. Patient is not currently taking opioid prescriptions. Functional ability and status Nutritional status Physical activity Advanced directives List of other physicians Hospitalizations, surgeries, and ER visits in previous 12 months Vitals Screenings to include cognitive, depression, and falls Referrals and appointments  In addition, I have reviewed and discussed with patient certain preventive protocols, quality metrics, and best practice recommendations. A written personalized care plan for preventive services as well as general preventive health recommendations were provided to patient.     Raylene Calamity, NP   06/03/2024   After Visit Summary: (In Person-Printed) AVS printed and given to the patient  Nurse Notes: NA

## 2024-06-17 ENCOUNTER — Non-Acute Institutional Stay: Admitting: Adult Health

## 2024-06-17 ENCOUNTER — Encounter: Payer: Self-pay | Admitting: Adult Health

## 2024-06-17 VITALS — BP 118/64 | HR 104 | Temp 98.6°F | Ht 65.0 in | Wt 127.0 lb

## 2024-06-17 DIAGNOSIS — I272 Pulmonary hypertension, unspecified: Secondary | ICD-10-CM | POA: Diagnosis not present

## 2024-06-17 DIAGNOSIS — R531 Weakness: Secondary | ICD-10-CM | POA: Diagnosis not present

## 2024-06-17 DIAGNOSIS — F411 Generalized anxiety disorder: Secondary | ICD-10-CM

## 2024-06-17 DIAGNOSIS — J0110 Acute frontal sinusitis, unspecified: Secondary | ICD-10-CM

## 2024-06-17 MED ORDER — SALINE SPRAY 0.65 % NA SOLN: 2.0000 | Freq: Two times a day (BID) | NASAL | 0 refills | Status: AC

## 2024-06-17 MED ORDER — CEFDINIR 300 MG PO CAPS: 300.0000 mg | ORAL_CAPSULE | Freq: Two times a day (BID) | ORAL | 0 refills | Status: AC

## 2024-06-17 NOTE — Progress Notes (Signed)
 Location:  [facility]  POS: Clinic  Provider: Tawni America, ANP  Code Status: DNR Goals of Care:     06/03/2024    1:27 PM  Advanced Directives  Does Patient Have a Medical Advance Directive? Yes  Type of Estate agent of [place];Living will;Out of facility DNR (pink MOST or yellow form)  Does patient want to make changes to medical advance directive? No - Patient declined  Copy of Healthcare Power of Attorney in Chart? Yes - validated most recent copy scanned in chart (See row information)     Chief Complaint  Patient presents with   Nausea      History of Present Illness   [name] French is a 89 year old female with pulmonary hypertension who presents with nasal congestion and weakness.  She experiences significant nasal congestion, describing her head as 'full of phlegm' which takes a long time to clear in the morning. This effort sometimes causes her to heave, though she is not nauseated. She has been using Flonase , a steroid nasal spray, and takes Zyrtec 10 mg for this issue. No fever or cough. Her nasal passages have 'little globules' of phlegm mixed with blood, and sometimes dark blood in the morning.  She experiences episodes of physical weakness, particularly 'weak in the knees,' accompanied by a sensation of heat, shakiness, and anxiety. These episodes are transient and sometimes relieved by consuming sugar. These symptoms occur when she stands up, but not when sitting. Her pulse is often around 102-104. She recalls an incident on June 25th where her oxygen  saturation was recorded at 68% due to a tank issue, but this was resolved.  She experiences shortness of breath upon waking, which improves when sitting but worsens with activity. She also feels shaky and anxious, and takes Lexapro  for anxiety, and she sleeps well through the night. She occasionally experiences panic attacks, which she manages by stopping and breathing deeply.          Past Medical History:  Diagnosis Date   Allergic rhinitis    Anemia    during pregnancy   Arthritis    Breast cancer (HCC) 05/08/13   right upper inner, invasive mammary   Breast cancer, right breast (HCC) 04/16/2013   Underwent lumpectomy on 11/06/13. 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   Use of letrozole  (Femara )    neoadjuvant antiestrogen therapy with letrozole  2.5 mg daily x 7 monhts    Past Surgical History:  Procedure Laterality Date   BREAST BIOPSY Left 1960   lt br bx/benign   BREAST LUMPECTOMY Right    BREAST LUMPECTOMY Right 02/10/2020   Procedure: RIGHT BREAST LUMPECTOMY;  Surgeon: Vernetta Berg, MD;  Location: [facility] OR;  Service: General;  Laterality: Right;   BREAST LUMPECTOMY WITH NEEDLE LOCALIZATION Bilateral 11/06/2013   Procedure: BREAST LUMPECTOMY WITH NEEDLE LOCALIZATION;  Surgeon: Sherlean JINNY Laughter, MD;  Location: [facility];  Service: General;  Laterality: Bilateral;   COLONOSCOPY     EYE SURGERY Bilateral    both cataracts   MOHS SURGERY Right    nose basal/squamous   THORACENTESIS N/A 08/30/2023   Procedure: THORACENTESIS;  Surgeon: Kassie Acquanetta Bradley, MD;  Location: [facility] ENDOSCOPY;  Service: Pulmonary;  Laterality: N/A;   TOTAL HIP ARTHROPLASTY Left 08/31/2018   Procedure: LEFT TOTAL HIP ARTHROPLASTY ANTERIOR APPROACH;  Surgeon: Vernetta Lonni GRADE, MD;  Location: [facility] ORS;  Service: Orthopedics;  Laterality: Left;   VIDEO BRONCHOSCOPY WITH ENDOBRONCHIAL NAVIGATION N/A 11/01/2017   Procedure: VIDEO BRONCHOSCOPY  WITH ENDOBRONCHIAL NAVIGATION;  Surgeon: Kerrin Elspeth BROCKS, MD;  Location: [facility] OR;  Service: Thoracic;  Laterality: N/A;   WRIST SURGERY  1990   lt    Allergies  Allergen Reactions   Other Anaphylaxis    Lobster- years ago   Codeine Itching    Insomnia    Hydrocodone  Itching and Other (See Comments)    Insomnia   Neosporin [Neomycin-Bacitracin Zn-Polymyx] Rash    Outpatient Encounter Medications as of 06/17/2024  Medication Sig   carboxymethylcellulose (ARTIFICIAL TEARS) 1 % ophthalmic solution 1 drop every 2 (two) hours as needed. ` gtt right eye Q1- 2hrs, ophthalmic (eye), every 2 hours - PRN, Patient may keep at bed side and may self administer/   cefdinir  (OMNICEF ) 300 MG capsule Take 1 capsule (300 mg total) by mouth 2 (two) times daily for 10 days.   cetirizine (ZYRTEC) 10 MG tablet Take 10 mg by mouth daily.   Cholecalciferol  (VITAMIN D -3) 1000 UNITS CAPS Take 2,000 Units by mouth daily.   escitalopram  (LEXAPRO ) 10 MG tablet Take 15 mg by mouth daily.   famotidine  (PEPCID ) 20 MG tablet Take 1 tablet (20 mg total) by mouth daily.   metoprolol  tartrate (LOPRESSOR ) 25 MG tablet Take 0.5 tablets (12.5 mg total) by mouth 2 (two) times daily.   Multiple Vitamins-Minerals (PRESERVISION AREDS 2) CAPS Take 1 mg by mouth in the morning and at bedtime.   nitroGLYCERIN  (NITROSTAT ) 0.4 MG SL tablet Place 0.4 mg under the tongue every 5 (five) minutes as needed for chest pain.   OXYGEN  Inhale 3 L into the lungs as directed. Every Shift; Shift 1, Shift 2, Shift 3   potassium chloride  (KLOR-CON  M) 10 MEQ tablet Take 10 mEq by mouth daily.   sodium chloride  (OCEAN) 0.65 % SOLN nasal spray Place 2 sprays into both nostrils in the morning and at bedtime.   torsemide  (DEMADEX ) 20 MG tablet Take 1 tablet (20 mg total) by mouth daily. 1 tablet once a morning   [DISCONTINUED] fluticasone  (FLONASE ) 50 MCG/ACT nasal spray Place 2 sprays into both nostrils daily.   No facility-administered  encounter medications on file as of 06/17/2024.    Review of Systems:  Review of Systems  Constitutional:  Negative for activity change, appetite change, chills, diaphoresis, fatigue, fever and unexpected weight change.  HENT:  Positive for congestion, hearing loss, rhinorrhea and sinus pressure. Negative for sneezing, sore throat and trouble swallowing.   Respiratory:  Positive for shortness of breath. Negative for cough and wheezing.   Cardiovascular:  Negative for chest pain, palpitations and leg swelling.  Gastrointestinal:  Negative for abdominal distention, abdominal pain, constipation and diarrhea.       Dry heaves  Genitourinary:  Negative for difficulty urinating and dysuria.  Musculoskeletal:  Positive for gait problem. Negative for arthralgias, back pain, joint swelling and myalgias.  Neurological:  Positive for weakness. Negative  for dizziness, tremors, seizures, syncope, facial asymmetry, speech difficulty, light-headedness, numbness and headaches.  Psychiatric/Behavioral:  Negative for agitation, behavioral problems and confusion.     Health Maintenance  Topic Date Due   Zoster Vaccines- Shingrix (1 of 2) 09/03/2024 (Originally 11/23/1945)   COVID-19 Vaccine (5 - Moderna risk 2024-25 season) 12/19/2024 (Originally 05/01/2024)   INFLUENZA VACCINE  07/19/2024   Medicare Annual Wellness (AWV)  06/03/2025   DTaP/Tdap/Td (3 - Tdap) 04/11/2028   Pneumococcal Vaccine: 50+ Years  Completed   DEXA SCAN  Completed   Hepatitis B Vaccines  Aged Out   HPV VACCINES  Aged Out   Meningococcal B Vaccine  Aged Out   Colonoscopy  Discontinued    Physical Exam: Vitals:   06/17/24 1127  BP: 118/64  Pulse: (!) 104  Temp: 98.6 F (37 C)  SpO2: 94%  Weight: 127 lb (57.6 kg)  Height: 5' 5 (1.651 m)   Body mass index is 21.13 kg/m. Physical Exam Vitals and nursing note reviewed.  Constitutional:      General: She is not in acute distress.    Appearance: Normal appearance. She is  not diaphoretic.  HENT:     Head: Normocephalic and atraumatic.     Right Ear: Tympanic membrane normal.     Left Ear: Tympanic membrane normal.     Nose: Congestion and rhinorrhea present.     Comments: Purulent matter, erythema and white patches, swelling    Mouth/Throat:     Mouth: Mucous membranes are moist.     Pharynx: Oropharynx is clear. No oropharyngeal exudate or posterior oropharyngeal erythema.   Eyes:     Conjunctiva/sclera: Conjunctivae normal.     Pupils: Pupils are equal, round, and reactive to light.   Neck:     Thyroid : No thyromegaly.     Vascular: No carotid bruit or JVD.   Cardiovascular:     Rate and Rhythm: Regular rhythm. Tachycardia present.     Heart sounds: No murmur heard.    Comments: 102 Pulmonary:     Effort: Pulmonary effort is normal. No respiratory distress.     Breath sounds: No stridor.     Comments: Decreased bases Abdominal:     General: Bowel sounds are normal. There is no distension.     Palpations: Abdomen is soft.     Tenderness: There is no abdominal tenderness.   Musculoskeletal:        General: Normal range of motion.     Cervical back: No rigidity. No muscular tenderness.     Right lower leg: No edema.     Left lower leg: No edema.  Lymphadenopathy:     Cervical: No cervical adenopathy.   Skin:    General: Skin is warm and dry.   Neurological:     General: No focal deficit present.     Mental Status: She is alert and oriented to person, place, and time. Mental status is at baseline.   Psychiatric:        Mood and Affect: Mood normal.     Labs reviewed: Basic Metabolic Panel: Recent Labs    08/11/23 1307 08/25/23 0000 08/31/23 0000 01/25/24 0000 02/07/24 1039 02/22/24 0000  NA 143 139   < > 141 140 139  K 4.1 5.4*   < > 4.4 4.1 4.1  CL 94* 94*   < > 98* 98 96*  CO2 37* 31*   < > 30* 36* 38*  GLUCOSE 99  --   --   --  100*  --   BUN 17 17   < > 19 23 16   CREATININE 0.70 0.7   < > 0.8 0.75 0.8  CALCIUM 9.6  9.7   < > 9.5 9.5 9.3  TSH  --  2.40  --   --   --   --    < > = values in this interval not displayed.   Liver Function Tests: Recent Labs    08/30/23 1611 01/25/24 0000 02/07/24 1039 02/22/24 0000  AST  --  16 18 15   ALT  --  11 12 10   ALKPHOS  --  66 53 59  BILITOT  --   --  0.6  --   PROT 6.6  --  6.5  --   ALBUMIN  --  3.5 3.3* 3.4*   No results for input(s): LIPASE, AMYLASE in the last 8760 hours. No results for input(s): AMMONIA in the last 8760 hours. CBC: Recent Labs    02/07/24 1039 02/08/24 0553 02/08/24 1648 02/09/24 0837 02/20/24 0000  WBC 6.8 5.4 5.8 4.6 4.6  NEUTROABS 5.1  --   --   --   --   HGB 10.6* 9.8* 10.1* 9.8* 9.4*  HCT 34.1* 32.6* 33.1* 32.9* 29*  MCV 90.0 92.9 91.4 93.2  --   PLT 164 145* 165 160 189   Lipid Panel: Recent Labs    01/25/24 0000  CHOL 148  HDL 65  LDLCALC 70  TRIG 65   Lab Results  Component Value Date   HGBA1C 5.6 12/25/2017    Procedures since last visit: No results found.   Assessment and Plan    Sinus Infection Nasal congestion with phlegm and blood-tinged mucus suggests possible sinus infection. Irritation likely from chronic oxygen  and nasal steroid use. - Discontinue Flonase  temporarily. - Prescribe Omnicef . - Use saline nasal spray twice daily. - Follow up in two weeks for nasal examination.  Possible Orthostatic Hypotension Weakness, shakiness, and anxiety upon standing suggest orthostatic hypotension. Blood pressure is soft at times.  - Perform orthostatic blood pressure measurements. -if feeling shaky check CBG .  Anxiety Anxiety and panic attacks exacerbated by breathing difficulties. Lexapro  10 mg is well-tolerated.  - Increase Lexapro  to 15 mg daily. - Check sodium levels due to hyponatremia risk. - Consider Xanax for acute anxiety episodes if needed.  Pulmonary Hypertension Pulmonary hypertension well-managed with torsemide  and oxygen  therapy 3 liters.  Follow-up Requires  follow-up to assess treatment effectiveness and monitor symptoms or side effects. - Schedule follow-up appointment in two weeks for nasal examination and review of symptoms. - BMP and CBC in 1 week.          Labs/tests ordered:  * No order type specified * Next appt:  07/01/2024   Total time :  time greater than 50% of total time spent doing pt counseling and coordination of care

## 2024-06-25 DIAGNOSIS — D508 Other iron deficiency anemias: Secondary | ICD-10-CM | POA: Diagnosis not present

## 2024-06-25 LAB — HEPATIC FUNCTION PANEL
ALT: 7 U/L (ref 7–35)
AST: 13 (ref 13–35)
Alkaline Phosphatase: 70 (ref 25–125)
Bilirubin, Total: 0.2

## 2024-06-25 LAB — BASIC METABOLIC PANEL WITH GFR
BUN: 16 (ref 4–21)
CO2: 37 — AB (ref 13–22)
Chloride: 97 — AB (ref 99–108)
Creatinine: 0.9 (ref 0.5–1.1)
Glucose: 95
Potassium: 4 meq/L (ref 3.5–5.1)
Sodium: 144 (ref 137–147)

## 2024-06-25 LAB — COMPREHENSIVE METABOLIC PANEL WITH GFR
Albumin: 3.4 — AB (ref 3.5–5.0)
Calcium: 9.5 (ref 8.7–10.7)
Globulin: 2.4
eGFR: 61

## 2024-07-01 ENCOUNTER — Encounter: Admitting: Adult Health

## 2024-08-01 DIAGNOSIS — R042 Hemoptysis: Secondary | ICD-10-CM | POA: Diagnosis not present

## 2024-08-01 LAB — BASIC METABOLIC PANEL WITH GFR
BUN: 18 (ref 4–21)
CO2: 36 — AB (ref 13–22)
Chloride: 95 — AB (ref 99–108)
Creatinine: 0.7 (ref 0.5–1.1)
Glucose: 92
Potassium: 4.2 meq/L (ref 3.5–5.1)
Sodium: 140 (ref 137–147)

## 2024-08-01 LAB — HEPATIC FUNCTION PANEL
ALT: 8 U/L (ref 7–35)
AST: 17 (ref 13–35)
Alkaline Phosphatase: 75 (ref 25–125)
Bilirubin, Total: 0.4

## 2024-08-01 LAB — COMPREHENSIVE METABOLIC PANEL WITH GFR
Albumin: 3.9 (ref 3.5–5.0)
Calcium: 10.3 (ref 8.7–10.7)
Globulin: 2.7
eGFR: 75

## 2024-08-01 LAB — CBC AND DIFFERENTIAL
HCT: 34 — AB (ref 36–46)
Hemoglobin: 10.6 — AB (ref 12.0–16.0)
Platelets: 227 K/uL (ref 150–400)
WBC: 5.5

## 2024-08-01 LAB — CBC: RBC: 4.22 (ref 3.87–5.11)

## 2024-08-05 ENCOUNTER — Telehealth (HOSPITAL_BASED_OUTPATIENT_CLINIC_OR_DEPARTMENT_OTHER): Payer: Self-pay | Admitting: Pulmonary Disease

## 2024-08-05 ENCOUNTER — Non-Acute Institutional Stay: Admitting: Adult Health

## 2024-08-05 ENCOUNTER — Encounter: Payer: Self-pay | Admitting: Adult Health

## 2024-08-05 VITALS — BP 116/70 | HR 56 | Temp 98.1°F | Ht 65.0 in | Wt 125.2 lb

## 2024-08-05 DIAGNOSIS — F411 Generalized anxiety disorder: Secondary | ICD-10-CM

## 2024-08-05 DIAGNOSIS — J9611 Chronic respiratory failure with hypoxia: Secondary | ICD-10-CM | POA: Diagnosis not present

## 2024-08-05 DIAGNOSIS — J310 Chronic rhinitis: Secondary | ICD-10-CM | POA: Diagnosis not present

## 2024-08-05 DIAGNOSIS — I272 Pulmonary hypertension, unspecified: Secondary | ICD-10-CM | POA: Diagnosis not present

## 2024-08-05 MED ORDER — ALPRAZOLAM 0.25 MG PO TABS: 0.2500 mg | ORAL_TABLET | Freq: Every day | ORAL | 0 refills | Status: AC | PRN

## 2024-08-05 MED ORDER — MUPIROCIN 2 % EX OINT
1.0000 | TOPICAL_OINTMENT | Freq: Every day | CUTANEOUS | Status: AC
Start: 2024-08-05 — End: ?

## 2024-08-05 NOTE — Telephone Encounter (Signed)
 Left voicemail for patient. Dr Kassie is requesting a f/u. Next available okay.

## 2024-08-05 NOTE — Progress Notes (Signed)
 Location:  [facility]  POS: Clinic  Provider: Tawni America, ANP  Code Status: DNR Goals of Care:     06/03/2024    1:27 PM  Advanced Directives  Does Patient Have a Medical Advance Directive? Yes  Type of Estate agent of [place];Living will;Out of facility DNR (pink MOST or yellow form)  Does patient want to make changes to medical advance directive? No - Patient declined  Copy of Healthcare Power of Attorney in Chart? Yes - validated most recent copy scanned in chart (See row information)     Chief Complaint  Patient presents with   Follow-up    3 month follow up    HPI: Discussed the use of AI scribe software for clinical note transcription with the patient, who gave verbal consent to proceed.  [place] in [facility] Uses a PWC for long distances, walker for shorter distances.   PMH significant for pleural effusion biltateral with thoracentesis on the right 08/2023  Other issues are a hx of breast ca on the right s/p bilateral lumpectomy in 2014 with recurrence in 2021 on the right s/p another right lumpectomy, given estrogen therapy and chemo. In 2018 she was found to have NSCLC on the right and received radiation  Pulmonary HTN 2D echo 08/01/23 Ef 60-65%, severe pulmonary HTN and severe TR  History of Present Illness [name] French is a 89 year old female with pulmonary hypertension who presents for a three-month follow-up.  Dyspnea and oxygen  requirement - Increased dyspnea on exertion per pt - Currently requires 3 liters of supplemental oxygen  - Shortness of breath occurs with activities such as walking or lifting - Normal breathing at rest -C02 36  Chronic cough and nasal congestion  - Produces significant, thick, and persistent phlegm -occasional blood color. Not changed from baseline -noted to have a chronic rhinitis on exam, did try omnicef  in June with minimal help -off flonase  due to irritation using saline spray -try  muprocin for dry nasal passages.   Anxiety and psychiatric symptoms - Lexapro  dosage increased for anxiety - Anxiety improved with only occasional nervousness -add prn xanax    Nasal and upper respiratory symptoms - Uses Zyrtec and saline nasal spray twice daily for nasal symptoms  Anemia Normocytic normochromic - Mild anemia persists with hemoglobin level of 10.6 - Liver and kidney function are good    Off statin due to goals of care.   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 Path showed G1 ILC, 2.7 cm, neg margins, receptor+, Her2neg    Diverticulosis    Dyspnea    when carry heavy packages   Dysrhythmia    Sinus Tach- on Metoprolol    Headache    Has aura only   Hemorrhoids    Hx of adenomatous colonic polyps 05/2002   Lung cancer (HCC)    Lung cancer (HCC) 10/2017   Meningioma (HCC)    Osteoporosis    Pneumonia     x2 last time was 2019   Rosacea    Skin cancer    Tachycardia    Tubulovillous  adenoma polyp of colon 09/2010   Use of letrozole  (Femara )    neoadjuvant antiestrogen therapy with letrozole  2.5 mg daily x 7 monhts    Past Surgical History:  Procedure Laterality Date   BREAST BIOPSY Left 1960   lt br bx/benign   BREAST LUMPECTOMY Right    BREAST LUMPECTOMY Right 02/10/2020   Procedure: RIGHT BREAST LUMPECTOMY;  Surgeon: Vernetta Berg, MD;  [facility]: [facility];  Service: General;  Laterality: Right;   BREAST LUMPECTOMY WITH NEEDLE LOCALIZATION Bilateral 11/06/2013   Procedure: BREAST LUMPECTOMY WITH NEEDLE LOCALIZATION;  Surgeon: Sherlean JINNY Laughter, MD;  Location: [facility];  Service: General;  Laterality: Bilateral;   COLONOSCOPY     EYE SURGERY Bilateral    both cataracts   MOHS SURGERY Right    nose basal/squamous   THORACENTESIS N/A 08/30/2023   Procedure: THORACENTESIS;  Surgeon: Kassie Acquanetta Bradley, MD;  [facility]: [facility] ENDOSCOPY;  Service: Pulmonary;  Laterality: N/A;   TOTAL HIP ARTHROPLASTY Left 08/31/2018   Procedure: LEFT TOTAL HIP ARTHROPLASTY ANTERIOR APPROACH;  Surgeon: Vernetta Lonni GRADE, MD;  Location: [facility]S;  Service: Orthopedics;  Laterality: Left;   VIDEO BRONCHOSCOPY WITH ENDOBRONCHIAL NAVIGATION N/A 11/01/2017   Procedure: VIDEO BRONCHOSCOPY WITH ENDOBRONCHIAL NAVIGATION;  Surgeon: Kerrin Elspeth BROCKS, MD;  Location: [facility];  Service: Thoracic;  Laterality: N/A;   WRIST SURGERY  1990   lt    Allergies  Allergen Reactions   Other Anaphylaxis    Lobster- years ago   Codeine Itching    Insomnia    Hydrocodone  Itching and Other (See Comments)    Insomnia   Neosporin [Neomycin-Bacitracin Zn-Polymyx] Rash    Outpatient Encounter Medications as of 08/05/2024  Medication Sig   cetirizine (ZYRTEC) 10 MG tablet Take 10 mg by mouth daily.   Cholecalciferol  (VITAMIN D -3) 1000 UNITS CAPS Take 2,000 Units by mouth daily.   escitalopram  (LEXAPRO ) 10 MG tablet Take 15 mg by mouth daily.   famotidine  (PEPCID ) 20 MG tablet Take 1 tablet (20 mg total) by mouth daily.   metoprolol  tartrate (LOPRESSOR ) 25 MG tablet Take 0.5 tablets (12.5 mg total) by mouth 2 (two) times daily.   Multiple Vitamins-Minerals (PRESERVISION AREDS 2) CAPS Take 1 mg by mouth in the morning and at bedtime.   OXYGEN  Inhale 3 L into the lungs as directed. Every Shift; Shift 1, Shift 2, Shift 3   potassium chloride  (KLOR-CON  M) 10 MEQ tablet Take 10 mEq by mouth daily.   sodium chloride  (OCEAN) 0.65 % SOLN nasal spray Place 2 sprays into both nostrils in the morning and at bedtime.   torsemide  (DEMADEX ) 20 MG tablet Take 1 tablet (20 mg total) by mouth daily.  1 tablet once a morning   carboxymethylcellulose (ARTIFICIAL TEARS) 1 % ophthalmic solution 1 drop every 2 (two) hours as needed. ` gtt right eye Q1- 2hrs, ophthalmic (eye), every 2 hours - PRN, Patient may keep at bed side and may self administer/   nitroGLYCERIN  (NITROSTAT ) 0.4 MG SL tablet Place 0.4 mg under the tongue every 5 (five) minutes as needed for chest pain.   No facility-administered encounter medications on file as of 08/05/2024.    Review of Systems:  Review of Systems  Constitutional:  Positive for fatigue. Negative for activity change, appetite change, chills, diaphoresis and fever.  HENT:  Positive for congestion, hearing loss and rhinorrhea. Negative for ear discharge, sore throat and trouble swallowing.   Respiratory:  Positive for cough and shortness of breath. Negative for  wheezing.   Cardiovascular:  Negative for chest pain and leg swelling.  Gastrointestinal:  Negative for abdominal distention, abdominal pain, constipation, diarrhea, nausea and vomiting.  Genitourinary:  Negative for difficulty urinating, dysuria and urgency.  Musculoskeletal:  Positive for gait problem. Negative for back pain, myalgias and neck pain.  Skin:  Negative for rash.  Neurological:  Negative for dizziness and weakness.  Psychiatric/Behavioral:  Negative for confusion.     Health Maintenance  Topic Date Due   INFLUENZA VACCINE  07/19/2024   Zoster Vaccines- Shingrix (1 of 2) 09/03/2024 (Originally 11/23/1945)   COVID-19 Vaccine (5 - Moderna risk 2024-25 season) 12/19/2024 (Originally 05/01/2024)   Medicare Annual Wellness (AWV)  06/03/2025   DTaP/Tdap/Td (3 - Tdap) 04/11/2028   Pneumococcal Vaccine: 50+ Years  Completed   DEXA SCAN  Completed   HPV VACCINES  Aged Out   Meningococcal B Vaccine  Aged Out   Pneumococcal Vaccine  Discontinued   Colonoscopy  Discontinued    Physical Exam: Vitals:   08/05/24 0840  BP: 116/70  Pulse: (!) 56  Temp: 98.1 F (36.7 C)  SpO2: 95%   Weight: 125 lb 3.2 oz (56.8 kg)  Height: 5' 5 (1.651 m)   Body mass index is 20.83 kg/m. Wt Readings from Last 3 Encounters:  08/05/24 125 lb 3.2 oz (56.8 kg)  06/17/24 127 lb (57.6 kg)  06/03/24 127 lb 3.2 oz (57.7 kg)    Physical Exam Vitals reviewed.  Constitutional:      General: She is not in acute distress.    Appearance: She is not diaphoretic.  HENT:     Head: Normocephalic and atraumatic.     Right Ear: There is impacted cerumen.     Left Ear: Tympanic membrane and ear canal normal. There is no impacted cerumen.     Nose: Congestion and rhinorrhea present.     Mouth/Throat:     Mouth: Mucous membranes are moist.     Pharynx: Oropharynx is clear. No oropharyngeal exudate.  Eyes:     Conjunctiva/sclera: Conjunctivae normal.     Pupils: Pupils are equal, round, and reactive to light.  Neck:     Vascular: No JVD.  Cardiovascular:     Rate and Rhythm: Normal rate and regular rhythm.     Heart sounds: No murmur heard. Pulmonary:     Effort: Pulmonary effort is normal. No respiratory distress.     Breath sounds: No wheezing.     Comments: Decreased to bilateral bases mid lung and below Scattered wheeze Abdominal:     General: Bowel sounds are normal. There is no distension.     Palpations: Abdomen is soft.     Tenderness: There is no abdominal tenderness.  Musculoskeletal:     Right lower leg: No edema.     Left lower leg: No edema.  Skin:    General: Skin is warm and dry.  Neurological:     Mental Status: She is alert and oriented to person, place, and time.     Labs reviewed: Basic Metabolic Panel: Recent Labs    08/11/23 1307 08/25/23 0000 08/31/23 0000 02/07/24 1039 02/22/24 0000 06/25/24 0000 08/01/24 0000  NA 143 139   < > 140 139 144 140  K 4.1 5.4*   < > 4.1 4.1 4.0 4.2  CL 94* 94*   < > 98 96* 97* 95*  CO2 37* 31*   < > 36* 38* 37* 36*  GLUCOSE 99  --   --  100*  --   --   --  BUN 17 17   < > 23 16 16 18   CREATININE 0.70 0.7   < > 0.75  0.8 0.9 0.7  CALCIUM 9.6 9.7   < > 9.5 9.3 9.5 10.3  TSH  --  2.40  --   --   --   --   --    < > = values in this interval not displayed.   Liver Function Tests: Recent Labs    08/30/23 1611 01/25/24 0000 02/07/24 1039 02/22/24 0000 06/25/24 0000 08/01/24 0000  AST  --    < > 18 15 13 17   ALT  --    < > 12 10 7 8   ALKPHOS  --    < > 53 59 70 75  BILITOT  --   --  0.6  --   --   --   PROT 6.6  --  6.5  --   --   --   ALBUMIN  --    < > 3.3* 3.4* 3.4* 3.9   < > = values in this interval not displayed.   No results for input(s): LIPASE, AMYLASE in the last 8760 hours. No results for input(s): AMMONIA in the last 8760 hours. CBC: Recent Labs    02/07/24 1039 02/08/24 0553 02/08/24 1648 02/09/24 0837 02/20/24 0000 08/01/24 0000  WBC 6.8 5.4 5.8 4.6 4.6 5.5  NEUTROABS 5.1  --   --   --   --   --   HGB 10.6* 9.8* 10.1* 9.8* 9.4* 10.6*  HCT 34.1* 32.6* 33.1* 32.9* 29* 34*  MCV 90.0 92.9 91.4 93.2  --   --   PLT 164 145* 165 160 189 227   Lipid Panel: Recent Labs    01/25/24 0000  CHOL 148  HDL 65  LDLCALC 70  TRIG 65   Lab Results  Component Value Date   HGBA1C 5.6 12/25/2017    Procedures since last visit: No results found.  Assessment/Plan Assessment and Plan Assessment & Plan Pulmonary hypertension Increased exertional dyspnea, stable at rest on 3L oxygen . Occasional exertional chest pain likely due to pulmonary hypertension. Mild anemia.  - Continue 3L oxygen  as needed. - Monitor weight for significant changes. - Schedule CT scan and follow-up with oncology in February. -recommend f/u with pulmonary -decreased lung bases, declined increased in torsemide  -request comfort care  Chronic hypoxia  On 3 liters [place]  Chronic sinusitis Persistent thick phlegm with blood, chronic sinusitis. Previous antibiotics. Nasal exam shows purulent drainage and irritation. Declined prednisone due to side effects. - Prescribe Bactroban  nasal ointment for moisture  and irritation. - Continue saline nasal spray twice daily.  Anxiety disorder Anxiety managed with Lexapro  15 mg daily. Occasional nervousness, no panic attacks. Discussed Xanax  for severe anxiety, caution advised due to potential side effects and habit-forming nature. - Continue Lexapro  15 mg daily. - Prescribe Xanax  as needed for severe anxiety, with caution on use.  History of lung cancer No recent oncology follow-up. CT scan and oncology follow-up scheduled in February to monitor lung status. Prefers to avoid hospitalizations unless necessary. - Schedule CT scan and follow-up with oncology in February.      Labs/tests ordered:  * No order type specified * CBC BMP prior to next apt Next appt:  3 months   Total time :  time greater than 50% of total time spent doing pt counseling and coordination of care     Declined pna vaccine prevnar 20

## 2024-08-20 DIAGNOSIS — H353211 Exudative age-related macular degeneration, right eye, with active choroidal neovascularization: Secondary | ICD-10-CM | POA: Diagnosis not present

## 2024-08-26 DIAGNOSIS — H40023 Open angle with borderline findings, high risk, bilateral: Secondary | ICD-10-CM | POA: Diagnosis not present

## 2024-08-26 DIAGNOSIS — H52203 Unspecified astigmatism, bilateral: Secondary | ICD-10-CM | POA: Diagnosis not present

## 2024-08-26 DIAGNOSIS — Z961 Presence of intraocular lens: Secondary | ICD-10-CM | POA: Diagnosis not present

## 2024-10-22 ENCOUNTER — Non-Acute Institutional Stay: Admitting: Internal Medicine

## 2024-10-22 ENCOUNTER — Encounter: Payer: Self-pay | Admitting: Internal Medicine

## 2024-10-22 VITALS — BP 124/74 | HR 64 | Temp 97.9°F | Ht 65.0 in | Wt 129.6 lb

## 2024-10-22 DIAGNOSIS — I2782 Chronic pulmonary embolism: Secondary | ICD-10-CM

## 2024-10-22 DIAGNOSIS — Z7189 Other specified counseling: Secondary | ICD-10-CM | POA: Diagnosis not present

## 2024-10-22 DIAGNOSIS — J90 Pleural effusion, not elsewhere classified: Secondary | ICD-10-CM

## 2024-10-22 DIAGNOSIS — I5032 Chronic diastolic (congestive) heart failure: Secondary | ICD-10-CM

## 2024-10-22 DIAGNOSIS — I272 Pulmonary hypertension, unspecified: Secondary | ICD-10-CM | POA: Diagnosis not present

## 2024-10-22 DIAGNOSIS — Z853 Personal history of malignant neoplasm of breast: Secondary | ICD-10-CM | POA: Diagnosis not present

## 2024-10-22 DIAGNOSIS — C3491 Malignant neoplasm of unspecified part of right bronchus or lung: Secondary | ICD-10-CM | POA: Diagnosis not present

## 2024-10-22 DIAGNOSIS — J9611 Chronic respiratory failure with hypoxia: Secondary | ICD-10-CM | POA: Diagnosis not present

## 2024-10-22 DIAGNOSIS — R000 Tachycardia, unspecified: Secondary | ICD-10-CM | POA: Diagnosis not present

## 2024-10-22 DIAGNOSIS — E782 Mixed hyperlipidemia: Secondary | ICD-10-CM

## 2024-10-22 DIAGNOSIS — R042 Hemoptysis: Secondary | ICD-10-CM | POA: Diagnosis not present

## 2024-10-22 NOTE — Progress Notes (Unsigned)
 Location:   [facility]   Place of Service:  Clinic   Provider:   Code Status: DNR Goals of Care:     06/03/2024    1:27 PM  Advanced Directives  Does Patient Have a Medical Advance Directive? Yes  Type of Estate Agent of [place];Living will;Out of facility DNR (pink MOST or yellow form)  Does patient want to make changes to medical advance directive? No - Patient declined  Copy of Healthcare Power of Attorney in Chart? Yes - validated most recent copy scanned in chart (See row information)     Chief Complaint  Patient presents with   Follow-up    3 month  follow up    HPI: Patient is a 89 y.o. female seen today for medical management of chronic diseases.    She lives in [place] in [place]  Patient has a history of breast cancer s/p bilateral lumpectomy in 2014 with recurrence in 2021 in remission  history of NSCLC S/P Radiation not candidate for chemo or surgery History of chronic left pleural effusion that has been present for more than 4 years Diastolic CHF Echo Shows Severe TR and Elevated Pulmonary HTN and Normal EF CT scan Chronic Occlusive Thrombus in Left LL of Pulmonary Artery  H/o Hemoptysis due to Prior Radiation    COPD on Chronic Oxygen    Discussed the use of AI scribe software for clinical note transcription with the patient, who gave verbal consent to proceed.  History of Present Illness   [name] French is a 89 year old female with chronic lung issues who presents with increased shortness of breath and mucus production. Worsening Fatigue She experiences increased shortness of breath and mucus production, especially with physical activity such as dressing and showering, leading to anxiety and exhaustion. She uses both a portable oxygen  tank and a stationary machine, requiring daily refills. Persistent mucus production from her head sometimes contains blood, with one episode of coughing up blood, but no recent hemoptysis.   Her appetite is  low, though she enjoys ice cream, and her weight is stable.   She has regular bowel movements twice a day without diarrhea. She sleeps well and has not experienced any falls.   Wheezing occurs in the morning and subsides as the day progresses.       Walks with her walker Also Uses Power Chair Still Goes out with her daughter Past Medical History:  Diagnosis Date   Allergic rhinitis    Anemia    during pregnancy   Arthritis    Breast cancer (HCC) 05/08/13   right upper inner, invasive mammary   Breast cancer, right breast (HCC) 04/16/2013   Underwent lumpectomy on 11/06/13. 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   Use of letrozole  (Femara )    neoadjuvant antiestrogen therapy with letrozole  2.5 mg daily x 7 monhts    Past Surgical History:  Procedure Laterality Date   BREAST BIOPSY Left 1960   lt br bx/benign   BREAST LUMPECTOMY Right    BREAST LUMPECTOMY Right 02/10/2020   Procedure: RIGHT BREAST LUMPECTOMY;  Surgeon: Vernetta Berg, MD;  Location: [facility];  Service: General;  Laterality: Right;   BREAST LUMPECTOMY WITH NEEDLE LOCALIZATION Bilateral 11/06/2013   Procedure: BREAST LUMPECTOMY WITH NEEDLE LOCALIZATION;  Surgeon: Sherlean JINNY Laughter, MD;  Location: [facility];  Service: General;  Laterality: Bilateral;   COLONOSCOPY     EYE SURGERY Bilateral    both cataracts   MOHS SURGERY Right    nose basal/squamous   THORACENTESIS N/A 08/30/2023   Procedure: THORACENTESIS;  Surgeon: Kassie Acquanetta Bradley, MD;  Location: [facility] ENDOSCOPY;  Service:  Pulmonary;  Laterality: N/A;   TOTAL HIP ARTHROPLASTY Left 08/31/2018   Procedure: LEFT TOTAL HIP ARTHROPLASTY ANTERIOR APPROACH;  Surgeon: Vernetta Lonni GRADE, MD;  Location: [facility];  Service: Orthopedics;  Laterality: Left;   VIDEO BRONCHOSCOPY WITH ENDOBRONCHIAL NAVIGATION N/A 11/01/2017   Procedure: VIDEO BRONCHOSCOPY WITH ENDOBRONCHIAL NAVIGATION;  Surgeon: Kerrin Elspeth BROCKS, MD;  Location: [facility];  Service: Thoracic;  Laterality: N/A;   WRIST SURGERY  1990   lt    Allergies  Allergen Reactions   Other Anaphylaxis    Lobster- years ago   Codeine Itching    Insomnia    Hydrocodone  Itching and Other (See Comments)    Insomnia   Neosporin [Neomycin-Bacitracin Zn-Polymyx] Rash    Outpatient Encounter Medications as of 10/22/2024  Medication Sig   carboxymethylcellulose (ARTIFICIAL TEARS) 1 % ophthalmic solution 1 drop every 2 (two) hours as needed. ` gtt right eye Q1- 2hrs, ophthalmic (eye), every 2 hours - PRN, Patient may keep at bed side and may self administer/   cetirizine (ZYRTEC) 10 MG tablet Take 10 mg by mouth daily.   Cholecalciferol  (VITAMIN D -3) 1000 UNITS CAPS Take 2,000 Units by mouth daily.   escitalopram  (LEXAPRO ) 10 MG tablet Take 15 mg by mouth daily.   famotidine  (PEPCID ) 20 MG tablet Take 1 tablet (20 mg total) by mouth daily.   metoprolol  tartrate (LOPRESSOR ) 25 MG tablet Take 0.5 tablets (12.5 mg total) by mouth 2 (two) times daily.   Multiple Vitamins-Minerals (PRESERVISION AREDS 2) CAPS Take 1 mg by mouth in the morning and at bedtime.   mupirocin  ointment (BACTROBAN ) 2 % Apply 1 Application topically daily.   nitroGLYCERIN  (NITROSTAT ) 0.4 MG SL tablet Place 0.4 mg under the tongue every 5 (five) minutes as needed for chest pain.   OXYGEN  Inhale 3 L into the lungs as directed. Every Shift; Shift 1, Shift 2, Shift 3   potassium chloride  (KLOR-CON  M) 10 MEQ tablet Take 10 mEq by mouth daily.   sodium chloride  (OCEAN) 0.65 % SOLN nasal spray Place 2  sprays into both nostrils in the morning and at bedtime.   torsemide  (DEMADEX ) 20 MG tablet Take 1 tablet (20 mg total) by mouth daily. 1 tablet once a morning   No facility-administered encounter medications on file as of 10/22/2024.    Review of Systems:  Review of Systems  Constitutional:  Positive for activity change. Negative for appetite change.  HENT: Negative.    Respiratory:  Positive for cough and shortness of breath.   Cardiovascular:  Negative for leg swelling.  Gastrointestinal:  Negative for constipation.  Genitourinary: Negative.   Musculoskeletal:  Positive for gait problem. Negative for arthralgias and myalgias.  Skin: Negative.   Neurological:  Positive for weakness. Negative for  dizziness.  Psychiatric/Behavioral:  Negative for confusion, dysphoric mood and sleep disturbance.     Health Maintenance  Topic Date Due   Zoster Vaccines- Shingrix (1 of 2) 11/23/1945   Mammogram  03/04/2023   Influenza Vaccine  07/19/2024   COVID-19 Vaccine (5 - 2025-26 season) 08/19/2024   Medicare Annual Wellness (AWV)  06/03/2025   DTaP/Tdap/Td (3 - Tdap) 04/11/2028   Pneumococcal Vaccine: 50+ Years  Completed   DEXA SCAN  Completed   Meningococcal B Vaccine  Aged Out   Colonoscopy  Discontinued    Physical Exam: Vitals:   10/22/24 1010  BP: 124/74  Pulse: 64  Temp: 97.9 F (36.6 C)  SpO2: 95%  Weight: 129 lb 9.6 oz (58.8 kg)  Height: 5' 5 (1.651 m)   Body mass index is 21.57 kg/m. Physical Exam Vitals reviewed.  Constitutional:      Appearance: Normal appearance.  HENT:     Head: Normocephalic.     Nose: Nose normal.     Mouth/Throat:     Mouth: Mucous membranes are moist.     Pharynx: Oropharynx is clear.  Eyes:     Pupils: Pupils are equal, round, and reactive to light.  Cardiovascular:     Rate and Rhythm: Normal rate and regular rhythm.     Pulses: Normal pulses.     Heart sounds: Murmur heard.  Pulmonary:     Effort: Pulmonary effort is normal.      Breath sounds: Normal breath sounds.     Comments: Decreased BS in Left Lower Lobe Some Rales But no Wheezing Abdominal:     General: Abdomen is flat. Bowel sounds are normal.     Palpations: Abdomen is soft.  Musculoskeletal:        General: No swelling.     Cervical back: Neck supple.  Skin:    General: Skin is warm.  Neurological:     General: No focal deficit present.     Mental Status: She is alert and oriented to person, place, and time.  Psychiatric:        Mood and Affect: Mood normal.        Thought Content: Thought content normal.     Labs reviewed: Basic Metabolic Panel: Recent Labs    02/07/24 1039 02/22/24 0000 06/25/24 0000 08/01/24 0000  NA 140 139 144 140  K 4.1 4.1 4.0 4.2  CL 98 96* 97* 95*  CO2 36* 38* 37* 36*  GLUCOSE 100*  --   --   --   BUN 23 16 16 18   CREATININE 0.75 0.8 0.9 0.7  CALCIUM 9.5 9.3 9.5 10.3   Liver Function Tests: Recent Labs    02/07/24 1039 02/22/24 0000 06/25/24 0000 08/01/24 0000  AST 18 15 13 17   ALT 12 10 7 8   ALKPHOS 53 59 70 75  BILITOT 0.6  --   --   --   PROT 6.5  --   --   --   ALBUMIN 3.3* 3.4* 3.4* 3.9   No results for input(s): LIPASE, AMYLASE in the last 8760 hours. No results for input(s): AMMONIA in the last 8760 hours. CBC: Recent Labs    02/07/24 1039 02/08/24 0553 02/08/24 1648 02/09/24 0837 02/20/24 0000 08/01/24 0000  WBC 6.8 5.4 5.8 4.6 4.6 5.5  NEUTROABS 5.1  --   --   --   --   --   HGB 10.6* 9.8* 10.1* 9.8* 9.4* 10.6*  HCT 34.1* 32.6* 33.1* 32.9* 29* 34*  MCV 90.0 92.9 91.4 93.2  --   --   PLT 164 145* 165 160 189 227   Lipid Panel: Recent Labs    01/25/24 0000  CHOL 148  HDL 65  LDLCALC 70  TRIG 65   Lab Results  Component Value Date   HGBA1C 5.6 12/25/2017    Procedures since last visit: No results found.  Assessment/Plan 1. Severe pulmonary hypertension (HCC) (Primary) Followed by Cardiology Poor Candidate for Valvular Intervention Per Pulmonary PAH due  to chronic Lung disease Her overall conditions are not reversible and best management would be oxygen  support as needed and medical management with diuresis with Cardiology.  Patient is aware On Demadex   2. Chronic respiratory failure with hypoxia (HCC) Continue Oxygen  Will Try Breo to see if it helps her SOB and Wheezing in the morning 3. Hemoptysis Stable Few episodes  Possible from her Fibrosis/Old Radiation  Patient does not want to go to hospital if it happens again 4. Sinus tachycardia On Metoprolol   5. Mixed hyperlipidemia Off sttin due to Goals of care  6. Hx of breast cancer Follow with Oncology PRn  7. Pleural effusion on left Transudate in the past with no Malignant cells  8. Depression and Anxiety Lexapro    9. Chronic diastolic congestive heart failure (HCC) On Low dose of Demadex   10. Non-small cell cancer of right lung (HCC) Repeat CT Pending Follows with Pulmonary  11. Other chronic pulmonary embolism, unspecified whether acute cor pulmonale present (HCC) No Treatment due to Hemoptysis and Chronicity She is Comfort care     Labs/tests ordered:  CBC,CMP before next visit Next appt:  01/20/2025  Covid,Flu,RSV and Prevnar

## 2024-10-23 DIAGNOSIS — Z23 Encounter for immunization: Secondary | ICD-10-CM | POA: Diagnosis not present

## 2024-10-24 MED ORDER — FLUTICASONE FUROATE-VILANTEROL 100-25 MCG/ACT IN AEPB: 1.0000 | INHALATION_SPRAY | Freq: Every day | RESPIRATORY_TRACT | Status: AC

## 2024-11-05 ENCOUNTER — Encounter: Payer: Self-pay | Admitting: Internal Medicine

## 2024-11-19 DIAGNOSIS — H353122 Nonexudative age-related macular degeneration, left eye, intermediate dry stage: Secondary | ICD-10-CM | POA: Diagnosis not present

## 2024-11-19 DIAGNOSIS — Z961 Presence of intraocular lens: Secondary | ICD-10-CM | POA: Diagnosis not present

## 2024-11-19 DIAGNOSIS — H35033 Hypertensive retinopathy, bilateral: Secondary | ICD-10-CM | POA: Diagnosis not present

## 2024-11-19 DIAGNOSIS — H43813 Vitreous degeneration, bilateral: Secondary | ICD-10-CM | POA: Diagnosis not present

## 2024-11-19 DIAGNOSIS — H353211 Exudative age-related macular degeneration, right eye, with active choroidal neovascularization: Secondary | ICD-10-CM | POA: Diagnosis not present

## 2024-12-20 ENCOUNTER — Non-Acute Institutional Stay: Payer: Self-pay | Admitting: Adult Health

## 2024-12-20 DIAGNOSIS — R051 Acute cough: Secondary | ICD-10-CM

## 2024-12-21 ENCOUNTER — Encounter: Payer: Self-pay | Admitting: Adult Health

## 2024-12-21 MED ORDER — LEVALBUTEROL HCL 0.63 MG/3ML IN NEBU: 0.6300 mg | INHALATION_SOLUTION | Freq: Two times a day (BID) | RESPIRATORY_TRACT | Status: AC

## 2024-12-21 NOTE — Progress Notes (Signed)
 " Location:  Medical Illustrator of Service:  ALF (13) Provider:   Bari America, ANP [facility] [phone]   Charlanne Fredia CROME, MD  Patient Care Team: Charlanne Fredia CROME, MD as PCP - General (Internal Medicine) Jordan, Peter M, MD as PCP - Cardiology (Cardiology) Odean Potts, MD as Consulting Physician (Hematology and Oncology)  Extended Emergency Contact Information Primary Emergency Contact: Sweeney,Cammie  [place] Home Phone: [phone] Relation: Daughter Secondary Emergency Contact: Cramer,Everett  [place] Home Phone: [phone] Mobile Phone: [phone] Relation: Son  Code Status:  DNR Goals of care: Advanced Directive information    06/03/2024    1:27 PM  Advanced Directives  Does Patient Have a Medical Advance Directive? Yes  Type of Estate Agent of [place];Living will;Out of facility DNR (pink MOST or yellow form)  Does patient want to make changes to medical advance directive? No - Patient declined  Copy of Healthcare Power of Attorney in Chart? Yes - validated most recent copy scanned in chart (See row information)     Chief Complaint  Patient presents with   Acute Visit    cough    HPI:  Pt is a 89 y.o. female seen today for an acute visit for cough  She reports a raspy voice and cough for a few days. Rapid covid and flu are negative. Sputum production is tan color. No sore throat body aches or GI symptoms. No fever. Has chronic DOE which is unchanged. 02 sats are normal on 2-3 liters of oxygen . She is 02 dependent and follows with pulmonary due to  Severe pulmonary HTN, chronic thrombosis, NSCLC s/p radiation. Currently on Breo started in November. No change in symptoms of morning cough and congestion.  Unchanged weight, no edema.   Past Medical History:  Diagnosis Date   Allergic rhinitis    Anemia    during pregnancy   Arthritis    Breast cancer (HCC)  05/08/13   right upper inner, invasive mammary   Breast cancer, right breast (HCC) 04/16/2013   Underwent lumpectomy on 11/06/13. 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   Use of letrozole  (Femara )    neoadjuvant antiestrogen therapy with letrozole  2.5 mg daily x 7 monhts   Past Surgical History:  Procedure Laterality Date   BREAST BIOPSY Left 1960   lt br bx/benign   BREAST LUMPECTOMY Right    BREAST LUMPECTOMY Right 02/10/2020   Procedure: RIGHT BREAST LUMPECTOMY;  Surgeon: Vernetta Berg, MD;  Location: [facility] OR;  Service: General;  Laterality: Right;   BREAST LUMPECTOMY WITH NEEDLE LOCALIZATION Bilateral 11/06/2013   Procedure: BREAST LUMPECTOMY WITH NEEDLE LOCALIZATION;  Surgeon: Sherlean JINNY Laughter, MD;  Location: [facility];  Service: General;  Laterality: Bilateral;   COLONOSCOPY     EYE SURGERY Bilateral    both cataracts   MOHS SURGERY Right  nose basal/squamous   THORACENTESIS N/A 08/30/2023   Procedure: THORACENTESIS;  Surgeon: Kassie Acquanetta Bradley, MD;  Location: [facility];  Service: Pulmonary;  Laterality: N/A;   TOTAL HIP ARTHROPLASTY Left 08/31/2018   Procedure: LEFT TOTAL HIP ARTHROPLASTY ANTERIOR APPROACH;  Surgeon: Vernetta Lonni GRADE, MD;  Location: [facility]S;  Service: Orthopedics;  Laterality: Left;   VIDEO BRONCHOSCOPY WITH ENDOBRONCHIAL NAVIGATION N/A 11/01/2017   Procedure: VIDEO BRONCHOSCOPY WITH ENDOBRONCHIAL NAVIGATION;  Surgeon: Kerrin Elspeth BROCKS, MD;  Location: [facility] OR;  Service: Thoracic;  Laterality: N/A;   WRIST  SURGERY  1990   lt    Allergies[1]  Outpatient Encounter Medications as of 12/20/2024  Medication Sig   carboxymethylcellulose (ARTIFICIAL TEARS) 1 % ophthalmic solution 1 drop every 2 (two) hours as needed. ` gtt right eye Q1- 2hrs, ophthalmic (eye), every 2 hours - PRN, Patient may keep at bed side and may self administer/   cetirizine (ZYRTEC) 10 MG tablet Take 10 mg by mouth daily.   Cholecalciferol  (VITAMIN D -3) 1000 UNITS CAPS Take 2,000 Units by mouth daily.   escitalopram  (LEXAPRO ) 10 MG tablet Take 15 mg by mouth daily.   famotidine  (PEPCID ) 20 MG tablet Take 1 tablet (20 mg total) by mouth daily.   fluticasone  furoate-vilanterol (BREO ELLIPTA ) 100-25 MCG/ACT AEPB Inhale 1 puff into the lungs daily.   metoprolol  tartrate (LOPRESSOR ) 25 MG tablet Take 0.5 tablets (12.5 mg total) by mouth 2 (two) times daily.   Multiple Vitamins-Minerals (PRESERVISION AREDS 2) CAPS Take 1 mg by mouth in the morning and at bedtime.   mupirocin  ointment (BACTROBAN ) 2 % Apply 1 Application topically daily.   nitroGLYCERIN  (NITROSTAT ) 0.4 MG SL tablet Place 0.4 mg under the tongue every 5 (five) minutes as needed for chest pain.   OXYGEN  Inhale 3 L into the lungs as directed. Every Shift; Shift 1, Shift 2, Shift 3   potassium chloride  (KLOR-CON  M) 10 MEQ tablet Take 10 mEq by mouth daily.   sodium chloride  (OCEAN) 0.65 % SOLN nasal spray Place 2 sprays into both nostrils in the morning and at bedtime.   torsemide  (DEMADEX ) 20 MG tablet Take 1 tablet (20 mg total) by mouth daily. 1 tablet once a morning   No facility-administered encounter medications on file as of 12/20/2024.    Review of Systems  Constitutional:  Negative for activity change, appetite change, chills, diaphoresis, fatigue and fever.  HENT:  Positive for congestion, rhinorrhea and voice change. Negative for sinus pressure, sinus pain, sneezing, sore throat and trouble swallowing.   Respiratory:  Positive for cough and shortness of breath.  Negative for wheezing.   Cardiovascular:  Negative for chest pain and leg swelling.  Gastrointestinal:  Negative for abdominal distention, abdominal pain, constipation, diarrhea, nausea and vomiting.  Genitourinary:  Negative for difficulty urinating, dysuria and urgency.  Musculoskeletal:  Positive for gait problem. Negative for back pain, myalgias and neck pain.  Skin:  Negative for rash.  Neurological:  Negative for dizziness and weakness.  Psychiatric/Behavioral:  Negative for confusion.     Immunization History  Administered Date(s) Administered   Fluad Quad(high Dose 65+) 09/14/2020   Fluad Trivalent(High Dose 65+) 11/01/2023   INFLUENZA, HIGH DOSE SEASONAL PF 10/06/2017   Influenza-Unspecified 09/18/2012, 09/27/2013, 09/24/2014, 10/01/2016   Moderna Covid-19 Fall Seasonal Vaccine 42yrs & older 11/02/2023   Moderna Covid-19 Vaccine  Bivalent Booster 51yrs & up 04/13/2023   Moderna SARS-COV2 Booster Vaccination 05/21/2021   Moderna Sars-Covid-2 Vaccination 12/28/2019, 01/29/2020   Pneumococcal Conjugate-13 03/18/2014   Pneumococcal  Polysaccharide-23 12/04/2008, 02/19/2013   Td 12/04/2008, 04/11/2018   Zoster, Live 12/04/2008   Zoster, Unspecified 06/16/2021   Pertinent  Health Maintenance Due  Topic Date Due   Mammogram  03/04/2023   Influenza Vaccine  Completed   Bone Density Scan  Completed   Colonoscopy  Discontinued      08/25/2023    6:56 AM 10/11/2023    7:16 PM 01/29/2024    2:44 PM 06/03/2024    1:29 PM 08/05/2024    1:55 PM  Fall Risk  Falls in the past year? 0 0 0 0 0  Was there an injury with Fall? 0  0  0  0  0   Fall Risk Category Calculator 0 0 0 0 0  Patient at Risk for Falls Due to History of fall(s) Impaired mobility Impaired balance/gait;Impaired mobility No Fall Risks Impaired balance/gait;Impaired mobility  Fall risk Follow up Falls evaluation completed Falls evaluation completed;Education provided;Falls prevention discussed Falls evaluation completed  Falls evaluation completed Falls evaluation completed     Data saved with a previous flowsheet row definition   Functional Status Survey:    Vitals:   12/21/24 0947  BP: 99/65  Pulse: 100  Resp: 16  Temp: 98.1 F (36.7 C)  SpO2: 98%   There is no height or weight on file to calculate BMI. Physical Exam Vitals and nursing note reviewed.  Constitutional:      Appearance: Normal appearance.  HENT:     Right Ear: Tympanic membrane normal.     Left Ear: Tympanic membrane normal.     Nose: Congestion and rhinorrhea present.     Mouth/Throat:     Mouth: Mucous membranes are moist.     Pharynx: Oropharynx is clear.  Eyes:     Conjunctiva/sclera: Conjunctivae normal.     Pupils: Pupils are equal, round, and reactive to light.  Cardiovascular:     Rate and Rhythm: Normal rate and regular rhythm.     Heart sounds: No murmur heard. Pulmonary:     Effort: Pulmonary effort is normal.     Breath sounds: Wheezing (mild exp wheeze bilateral) present.     Comments: Decreased breath sounds L>R Musculoskeletal:     Right lower leg: No edema.     Left lower leg: No edema.  Lymphadenopathy:     Cervical: No cervical adenopathy.  Skin:    General: Skin is warm and dry.  Neurological:     Mental Status: She is alert and oriented to person, place, and time. Mental status is at baseline.     Labs reviewed: Recent Labs    02/07/24 1039 02/22/24 0000 06/25/24 0000 08/01/24 0000  NA 140 139 144 140  K 4.1 4.1 4.0 4.2  CL 98 96* 97* 95*  CO2 36* 38* 37* 36*  GLUCOSE 100*  --   --   --   BUN 23 16 16 18   CREATININE 0.75 0.8 0.9 0.7  CALCIUM 9.5 9.3 9.5 10.3   Recent Labs    02/07/24 1039 02/22/24 0000 06/25/24 0000 08/01/24 0000  AST 18 15 13 17   ALT 12 10 7 8   ALKPHOS 53 59 70 75  BILITOT 0.6  --   --   --   PROT 6.5  --   --   --   ALBUMIN 3.3* 3.4* 3.4* 3.9   Recent Labs    02/07/24 1039 02/08/24 0553 02/08/24 1648 02/09/24 0837 02/20/24 0000 08/01/24 0000   WBC 6.8 5.4 5.8 4.6 4.6  5.5  NEUTROABS 5.1  --   --   --   --   --   HGB 10.6* 9.8* 10.1* 9.8* 9.4* 10.6*  HCT 34.1* 32.6* 33.1* 32.9* 29* 34*  MCV 90.0 92.9 91.4 93.2  --   --   PLT 164 145* 165 160 189 227   Lab Results  Component Value Date   TSH 2.40 08/25/2023   Lab Results  Component Value Date   HGBA1C 5.6 12/25/2017   Lab Results  Component Value Date   CHOL 148 01/25/2024   HDL 65 01/25/2024   LDLCALC 70 01/25/2024   TRIG 65 01/25/2024   CHOLHDL 3.1 12/26/2017    Significant Diagnostic Results in last 30 days:  No results found.  Assessment/Plan  1. Acute cough (Primary) No fever or low 02 sats Probable viral illness Hx of abnormal CXR and complicated pulmonary issues.  Monitor closely and report if worsening or not improving in the next 48 hrs - levalbuterol  (XOPENEX ) 0.63 MG/3ML nebulizer solution; Take 3 mLs (0.63 mg total) by nebulization in the morning and at bedtime.     [1]  Allergies Allergen Reactions   Other Anaphylaxis    Lobster- years ago   Codeine Itching    Insomnia    Hydrocodone  Itching and Other (See Comments)    Insomnia   Neosporin [Neomycin-Bacitracin Zn-Polymyx] Rash   "

## 2025-01-20 ENCOUNTER — Encounter: Admitting: Adult Health

## 2025-01-22 ENCOUNTER — Ambulatory Visit (HOSPITAL_COMMUNITY)
Admission: RE | Admit: 2025-01-22 | Discharge: 2025-01-22 | Disposition: A | Discharge status: Home / Self Care | Source: Ambulatory Visit | Attending: Urology | Admitting: Urology

## 2025-01-22 DIAGNOSIS — C3432 Malignant neoplasm of lower lobe, left bronchus or lung: Secondary | ICD-10-CM

## 2025-01-22 DIAGNOSIS — C3491 Malignant neoplasm of unspecified part of right bronchus or lung: Secondary | ICD-10-CM

## 2025-01-29 ENCOUNTER — Ambulatory Visit: Payer: Medicare Other | Admitting: Urology

## 2025-02-10 ENCOUNTER — Encounter: Admitting: Adult Health
